# Patient Record
Sex: Female | Born: 1940
Health system: Southern US, Community
[De-identification: ages and names within clinical notes are randomized; demographics above are authoritative.]

## PROBLEM LIST (undated history)

## (undated) DIAGNOSIS — R5383 Other fatigue: Secondary | ICD-10-CM

## (undated) DIAGNOSIS — R5381 Other malaise: Secondary | ICD-10-CM

## (undated) DIAGNOSIS — H8110 Benign paroxysmal vertigo, unspecified ear: Secondary | ICD-10-CM

## (undated) DIAGNOSIS — F17200 Nicotine dependence, unspecified, uncomplicated: Secondary | ICD-10-CM

## (undated) DIAGNOSIS — H269 Unspecified cataract: Secondary | ICD-10-CM

## (undated) DIAGNOSIS — I739 Peripheral vascular disease, unspecified: Secondary | ICD-10-CM

## (undated) DIAGNOSIS — I10 Essential (primary) hypertension: Secondary | ICD-10-CM

## (undated) DIAGNOSIS — R634 Abnormal weight loss: Secondary | ICD-10-CM

## (undated) DIAGNOSIS — E559 Vitamin D deficiency, unspecified: Secondary | ICD-10-CM

## (undated) DIAGNOSIS — I272 Pulmonary hypertension, unspecified: Secondary | ICD-10-CM

## (undated) DIAGNOSIS — M79609 Pain in unspecified limb: Secondary | ICD-10-CM

## (undated) DIAGNOSIS — M19049 Primary osteoarthritis, unspecified hand: Secondary | ICD-10-CM

## (undated) DIAGNOSIS — M25559 Pain in unspecified hip: Secondary | ICD-10-CM

## (undated) DIAGNOSIS — J069 Acute upper respiratory infection, unspecified: Secondary | ICD-10-CM

## (undated) DIAGNOSIS — I701 Atherosclerosis of renal artery: Secondary | ICD-10-CM

## (undated) DIAGNOSIS — R945 Abnormal results of liver function studies: Secondary | ICD-10-CM

## (undated) DIAGNOSIS — M542 Cervicalgia: Secondary | ICD-10-CM

## (undated) DIAGNOSIS — IMO0001 Reserved for inherently not codable concepts without codable children: Secondary | ICD-10-CM

## (undated) DIAGNOSIS — M1990 Unspecified osteoarthritis, unspecified site: Secondary | ICD-10-CM

## (undated) DIAGNOSIS — I82890 Acute embolism and thrombosis of other specified veins: Secondary | ICD-10-CM

## (undated) DIAGNOSIS — R609 Edema, unspecified: Secondary | ICD-10-CM

## (undated) DIAGNOSIS — I219 Acute myocardial infarction, unspecified: Secondary | ICD-10-CM

## (undated) DIAGNOSIS — M545 Low back pain, unspecified: Secondary | ICD-10-CM

## (undated) DIAGNOSIS — M4800 Spinal stenosis, site unspecified: Secondary | ICD-10-CM

## (undated) DIAGNOSIS — R0600 Dyspnea, unspecified: Secondary | ICD-10-CM

## (undated) DIAGNOSIS — I071 Rheumatic tricuspid insufficiency: Secondary | ICD-10-CM

## (undated) DIAGNOSIS — R0989 Other specified symptoms and signs involving the circulatory and respiratory systems: Secondary | ICD-10-CM

## (undated) DIAGNOSIS — E785 Hyperlipidemia, unspecified: Secondary | ICD-10-CM

## (undated) DIAGNOSIS — I771 Stricture of artery: Secondary | ICD-10-CM

## (undated) DIAGNOSIS — B373 Candidiasis of vulva and vagina: Secondary | ICD-10-CM

## (undated) DIAGNOSIS — R1013 Epigastric pain: Secondary | ICD-10-CM

## (undated) DIAGNOSIS — I8000 Phlebitis and thrombophlebitis of superficial vessels of unspecified lower extremity: Secondary | ICD-10-CM

## (undated) DIAGNOSIS — Z7901 Long term (current) use of anticoagulants: Secondary | ICD-10-CM

## (undated) DIAGNOSIS — B3731 Acute candidiasis of vulva and vagina: Secondary | ICD-10-CM

## (undated) DIAGNOSIS — E508 Other manifestations of vitamin A deficiency: Secondary | ICD-10-CM

## (undated) DIAGNOSIS — E041 Nontoxic single thyroid nodule: Secondary | ICD-10-CM

## (undated) DIAGNOSIS — I639 Cerebral infarction, unspecified: Secondary | ICD-10-CM

## (undated) DIAGNOSIS — I2510 Atherosclerotic heart disease of native coronary artery without angina pectoris: Secondary | ICD-10-CM

## (undated) DIAGNOSIS — Z9889 Other specified postprocedural states: Secondary | ICD-10-CM

## (undated) DIAGNOSIS — Z01419 Encounter for gynecological examination (general) (routine) without abnormal findings: Secondary | ICD-10-CM

## (undated) DIAGNOSIS — N390 Urinary tract infection, site not specified: Secondary | ICD-10-CM

## (undated) DIAGNOSIS — I480 Paroxysmal atrial fibrillation: Secondary | ICD-10-CM

## (undated) DIAGNOSIS — E1159 Type 2 diabetes mellitus with other circulatory complications: Secondary | ICD-10-CM

## (undated) DIAGNOSIS — H219 Unspecified disorder of iris and ciliary body: Secondary | ICD-10-CM

## (undated) HISTORY — DX: Other malaise: R53.81

## (undated) HISTORY — DX: Atherosclerotic heart disease of native coronary artery without angina pectoris: I25.10

## (undated) HISTORY — PX: ANGIOPLASTY: SHX39

## (undated) HISTORY — DX: Acute upper respiratory infection, unspecified: J06.9

## (undated) HISTORY — DX: Unspecified osteoarthritis, unspecified site: M19.90

## (undated) HISTORY — DX: Pain in unspecified limb: M79.609

## (undated) HISTORY — DX: Nontoxic single thyroid nodule: E04.1

## (undated) HISTORY — DX: Unspecified disorder of iris and ciliary body: H21.9

## (undated) HISTORY — DX: Stricture of artery: I77.1

## (undated) HISTORY — DX: Vitamin D deficiency, unspecified: E55.9

## (undated) HISTORY — DX: Atherosclerosis of renal artery: I70.1

## (undated) HISTORY — DX: Edema, unspecified: R60.9

## (undated) HISTORY — DX: Cerebral infarction, unspecified: I63.9

## (undated) HISTORY — DX: Phlebitis and thrombophlebitis of superficial vessels of unspecified lower extremity: I80.00

## (undated) HISTORY — DX: Dyspnea, unspecified: R06.00

## (undated) HISTORY — DX: Other specified symptoms and signs involving the circulatory and respiratory systems: R09.89

## (undated) HISTORY — DX: Primary osteoarthritis, unspecified hand: M19.049

## (undated) HISTORY — DX: Benign paroxysmal vertigo, unspecified ear: H81.10

## (undated) HISTORY — DX: Nicotine dependence, unspecified, uncomplicated: F17.200

## (undated) HISTORY — DX: Epigastric pain: R10.13

## (undated) HISTORY — DX: Other manifestations of vitamin A deficiency: E50.8

## (undated) HISTORY — DX: Reserved for inherently not codable concepts without codable children: IMO0001

## (undated) HISTORY — DX: Urinary tract infection, site not specified: N39.0

## (undated) HISTORY — PX: KIDNEY STONE SURGERY: SHX686

## (undated) HISTORY — PX: BACK SURGERY: SHX140

## (undated) HISTORY — DX: Acute myocardial infarction, unspecified: I21.9

## (undated) HISTORY — DX: Paroxysmal atrial fibrillation: I48.0

## (undated) HISTORY — DX: Hyperlipidemia, unspecified: E78.5

## (undated) HISTORY — DX: Cervicalgia: M54.2

## (undated) HISTORY — DX: Other specified postprocedural states: Z98.890

## (undated) HISTORY — DX: Low back pain, unspecified: M54.50

## (undated) HISTORY — DX: Spinal stenosis, site unspecified: M48.00

## (undated) HISTORY — DX: Acute embolism and thrombosis of other specified veins: I82.890

## (undated) HISTORY — DX: Acute candidiasis of vulva and vagina: B37.31

## (undated) HISTORY — DX: Peripheral vascular disease, unspecified: I73.9

## (undated) HISTORY — DX: Abnormal results of liver function studies: R94.5

## (undated) HISTORY — DX: Pain in unspecified hip: M25.559

## (undated) HISTORY — DX: Low back pain: M54.5

## (undated) HISTORY — DX: Encounter for gynecological examination (general) (routine) without abnormal findings: Z01.419

## (undated) HISTORY — DX: Essential (primary) hypertension: I10

## (undated) HISTORY — DX: Candidiasis of vulva and vagina: B37.3

## (undated) HISTORY — DX: Unspecified cataract: H26.9

## (undated) HISTORY — DX: Other fatigue: R53.83

## (undated) HISTORY — DX: Type 2 diabetes mellitus with other circulatory complications: E11.59

## (undated) HISTORY — DX: Long term (current) use of anticoagulants: Z79.01

## (undated) HISTORY — DX: Abnormal weight loss: R63.4

## (undated) SURGERY — Surgical Case
Anesthesia: *Unknown

---
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
## 1973-11-05 HISTORY — PX: ABDOMINAL HYSTERECTOMY: SHX81

## 1998-10-31 ENCOUNTER — Encounter: Admission: RE | Admit: 1998-10-31 | Discharge: 1999-01-29 | Payer: Self-pay | Admitting: Internal Medicine

## 1999-02-20 ENCOUNTER — Encounter: Payer: Self-pay | Admitting: Urology

## 1999-02-20 ENCOUNTER — Ambulatory Visit (HOSPITAL_COMMUNITY): Admission: RE | Admit: 1999-02-20 | Discharge: 1999-02-20 | Payer: Self-pay | Admitting: Urology

## 1999-10-02 ENCOUNTER — Encounter: Admission: RE | Admit: 1999-10-02 | Discharge: 1999-10-02 | Payer: Self-pay | Admitting: Internal Medicine

## 1999-10-02 ENCOUNTER — Encounter: Payer: Self-pay | Admitting: Internal Medicine

## 1999-11-06 DIAGNOSIS — I779 Disorder of arteries and arterioles, unspecified: Secondary | ICD-10-CM

## 1999-11-06 DIAGNOSIS — I639 Cerebral infarction, unspecified: Secondary | ICD-10-CM

## 1999-11-06 HISTORY — PX: CAROTID ENDARTERECTOMY: SUR193

## 1999-11-06 HISTORY — DX: Disorder of arteries and arterioles, unspecified: I77.9

## 1999-11-06 HISTORY — DX: Cerebral infarction, unspecified: I63.9

## 2000-05-23 ENCOUNTER — Ambulatory Visit (HOSPITAL_COMMUNITY): Admission: RE | Admit: 2000-05-23 | Discharge: 2000-05-23 | Payer: Self-pay | Admitting: *Deleted

## 2000-05-23 ENCOUNTER — Encounter: Payer: Self-pay | Admitting: *Deleted

## 2000-06-12 ENCOUNTER — Encounter: Payer: Self-pay | Admitting: *Deleted

## 2000-06-13 ENCOUNTER — Inpatient Hospital Stay (HOSPITAL_COMMUNITY): Admission: RE | Admit: 2000-06-13 | Discharge: 2000-06-15 | Payer: Self-pay | Admitting: *Deleted

## 2000-06-13 ENCOUNTER — Encounter (INDEPENDENT_AMBULATORY_CARE_PROVIDER_SITE_OTHER): Payer: Self-pay | Admitting: *Deleted

## 2000-06-26 ENCOUNTER — Encounter: Payer: Self-pay | Admitting: Neurology

## 2000-06-26 ENCOUNTER — Encounter: Payer: Self-pay | Admitting: Emergency Medicine

## 2000-06-26 ENCOUNTER — Inpatient Hospital Stay (HOSPITAL_COMMUNITY): Admission: EM | Admit: 2000-06-26 | Discharge: 2000-06-30 | Payer: Self-pay | Admitting: Emergency Medicine

## 2000-06-27 ENCOUNTER — Encounter: Payer: Self-pay | Admitting: Vascular Surgery

## 2000-06-28 ENCOUNTER — Encounter: Payer: Self-pay | Admitting: Neurology

## 2000-08-13 ENCOUNTER — Encounter: Payer: Self-pay | Admitting: *Deleted

## 2000-08-14 ENCOUNTER — Encounter (INDEPENDENT_AMBULATORY_CARE_PROVIDER_SITE_OTHER): Payer: Self-pay | Admitting: *Deleted

## 2000-08-14 ENCOUNTER — Inpatient Hospital Stay (HOSPITAL_COMMUNITY): Admission: RE | Admit: 2000-08-14 | Discharge: 2000-08-20 | Payer: Self-pay | Admitting: *Deleted

## 2000-08-14 ENCOUNTER — Encounter: Payer: Self-pay | Admitting: *Deleted

## 2000-08-16 ENCOUNTER — Encounter: Payer: Self-pay | Admitting: *Deleted

## 2000-11-05 HISTORY — PX: PERCUTANEOUS CORONARY STENT INTERVENTION (PCI-S): SHX6016

## 2001-07-23 ENCOUNTER — Encounter: Payer: Self-pay | Admitting: Internal Medicine

## 2001-07-23 ENCOUNTER — Encounter: Admission: RE | Admit: 2001-07-23 | Discharge: 2001-07-23 | Payer: Self-pay | Admitting: Internal Medicine

## 2001-07-24 ENCOUNTER — Ambulatory Visit (HOSPITAL_COMMUNITY): Admission: RE | Admit: 2001-07-24 | Discharge: 2001-07-25 | Payer: Self-pay | Admitting: Cardiology

## 2004-07-14 ENCOUNTER — Encounter: Admission: RE | Admit: 2004-07-14 | Discharge: 2004-07-14 | Payer: Self-pay

## 2005-04-15 ENCOUNTER — Ambulatory Visit (HOSPITAL_COMMUNITY): Admission: RE | Admit: 2005-04-15 | Discharge: 2005-04-15 | Payer: Self-pay

## 2005-05-15 ENCOUNTER — Encounter: Admission: RE | Admit: 2005-05-15 | Discharge: 2005-05-15 | Payer: Self-pay | Admitting: Orthopaedic Surgery

## 2006-03-13 DIAGNOSIS — R0600 Dyspnea, unspecified: Secondary | ICD-10-CM

## 2006-03-13 HISTORY — DX: Dyspnea, unspecified: R06.00

## 2006-08-28 ENCOUNTER — Encounter: Admission: RE | Admit: 2006-08-28 | Discharge: 2006-08-28 | Payer: Self-pay

## 2007-12-30 ENCOUNTER — Encounter: Admission: RE | Admit: 2007-12-30 | Discharge: 2007-12-30 | Payer: Self-pay | Admitting: Otolaryngology

## 2008-01-07 ENCOUNTER — Encounter: Admission: RE | Admit: 2008-01-07 | Discharge: 2008-01-07 | Payer: Self-pay | Admitting: Otolaryngology

## 2008-02-05 ENCOUNTER — Ambulatory Visit: Payer: Self-pay | Admitting: *Deleted

## 2008-02-09 ENCOUNTER — Encounter: Admission: RE | Admit: 2008-02-09 | Discharge: 2008-02-09 | Payer: Self-pay | Admitting: Internal Medicine

## 2008-07-19 ENCOUNTER — Encounter: Admission: RE | Admit: 2008-07-19 | Discharge: 2008-07-19 | Payer: Self-pay | Admitting: Otolaryngology

## 2008-08-26 ENCOUNTER — Ambulatory Visit: Payer: Self-pay | Admitting: *Deleted

## 2009-01-13 ENCOUNTER — Ambulatory Visit: Payer: Self-pay | Admitting: *Deleted

## 2009-02-09 ENCOUNTER — Encounter: Admission: RE | Admit: 2009-02-09 | Discharge: 2009-02-09 | Payer: Self-pay | Admitting: Internal Medicine

## 2009-07-14 ENCOUNTER — Ambulatory Visit: Payer: Self-pay | Admitting: Surgery

## 2009-11-08 ENCOUNTER — Encounter: Admission: RE | Admit: 2009-11-08 | Discharge: 2009-11-08 | Payer: Self-pay | Admitting: Internal Medicine

## 2010-01-27 ENCOUNTER — Ambulatory Visit: Payer: Self-pay | Admitting: Vascular Surgery

## 2010-08-08 ENCOUNTER — Ambulatory Visit: Payer: Self-pay | Admitting: Vascular Surgery

## 2010-11-26 ENCOUNTER — Encounter: Payer: Self-pay | Admitting: Internal Medicine

## 2010-12-27 ENCOUNTER — Other Ambulatory Visit: Payer: Self-pay | Admitting: Internal Medicine

## 2010-12-27 DIAGNOSIS — Z1231 Encounter for screening mammogram for malignant neoplasm of breast: Secondary | ICD-10-CM

## 2011-01-04 ENCOUNTER — Ambulatory Visit
Admission: RE | Admit: 2011-01-04 | Discharge: 2011-01-04 | Disposition: A | Discharge status: Home / Self Care | Payer: Medicare Other | Source: Ambulatory Visit | Attending: Internal Medicine | Admitting: Internal Medicine

## 2011-01-04 DIAGNOSIS — Z1231 Encounter for screening mammogram for malignant neoplasm of breast: Secondary | ICD-10-CM

## 2011-01-22 ENCOUNTER — Inpatient Hospital Stay (HOSPITAL_COMMUNITY)
Admission: EM | Admit: 2011-01-22 | Discharge: 2011-01-25 | DRG: 310 | Disposition: A | Discharge status: Home / Self Care | Payer: Medicare Other | Attending: Cardiology | Admitting: Cardiology

## 2011-01-22 ENCOUNTER — Emergency Department (HOSPITAL_COMMUNITY): Payer: Medicare Other

## 2011-01-22 DIAGNOSIS — E785 Hyperlipidemia, unspecified: Secondary | ICD-10-CM | POA: Diagnosis present

## 2011-01-22 DIAGNOSIS — I6529 Occlusion and stenosis of unspecified carotid artery: Secondary | ICD-10-CM | POA: Diagnosis present

## 2011-01-22 DIAGNOSIS — Z7982 Long term (current) use of aspirin: Secondary | ICD-10-CM

## 2011-01-22 DIAGNOSIS — Z87891 Personal history of nicotine dependence: Secondary | ICD-10-CM

## 2011-01-22 DIAGNOSIS — E119 Type 2 diabetes mellitus without complications: Secondary | ICD-10-CM | POA: Diagnosis present

## 2011-01-22 DIAGNOSIS — H6690 Otitis media, unspecified, unspecified ear: Secondary | ICD-10-CM | POA: Diagnosis present

## 2011-01-22 DIAGNOSIS — I658 Occlusion and stenosis of other precerebral arteries: Secondary | ICD-10-CM | POA: Diagnosis present

## 2011-01-22 DIAGNOSIS — I10 Essential (primary) hypertension: Secondary | ICD-10-CM | POA: Diagnosis present

## 2011-01-22 DIAGNOSIS — Z79899 Other long term (current) drug therapy: Secondary | ICD-10-CM

## 2011-01-22 DIAGNOSIS — I2510 Atherosclerotic heart disease of native coronary artery without angina pectoris: Secondary | ICD-10-CM | POA: Diagnosis present

## 2011-01-22 DIAGNOSIS — M129 Arthropathy, unspecified: Secondary | ICD-10-CM | POA: Diagnosis present

## 2011-01-22 DIAGNOSIS — F0390 Unspecified dementia without behavioral disturbance: Secondary | ICD-10-CM | POA: Diagnosis present

## 2011-01-22 DIAGNOSIS — I4891 Unspecified atrial fibrillation: Principal | ICD-10-CM | POA: Diagnosis present

## 2011-01-22 LAB — BASIC METABOLIC PANEL
BUN: 20 mg/dL (ref 6–23)
CO2: 27 mEq/L (ref 19–32)
Calcium: 9.2 mg/dL (ref 8.4–10.5)
Chloride: 93 mEq/L — ABNORMAL LOW (ref 96–112)
GFR calc Af Amer: >60 mL/min (ref >60)
Potassium: 3.8 mEq/L (ref 3.5–5.1)
Sodium: 132 mEq/L — ABNORMAL LOW (ref 135–145)

## 2011-01-22 LAB — URINALYSIS, ROUTINE W REFLEX MICROSCOPIC
Bilirubin Urine: NEGATIVE
Leukocytes, UA: NEGATIVE
Specific Gravity, Urine: 1.015 (ref 1.005–1.030)
Urobilinogen, UA: 1 mg/dL (ref 0.0–1.0)
pH: 6.5 (ref 5.0–8.0)

## 2011-01-22 LAB — COMPREHENSIVE METABOLIC PANEL
ALT: 33 U/L (ref 0–35)
Albumin: 2.9 g/dL — ABNORMAL LOW (ref 3.5–5.2)
Alkaline Phosphatase: 65 U/L (ref 39–117)
Chloride: 96 mEq/L (ref 96–112)
GFR calc non Af Amer: 59 mL/min — ABNORMAL LOW (ref >60)
Glucose, Bld: 216 mg/dL — ABNORMAL HIGH (ref 70–99)
Sodium: 134 mEq/L — ABNORMAL LOW (ref 135–145)
Total Protein: 7.2 g/dL (ref 6.0–8.3)

## 2011-01-22 LAB — PROTIME-INR: Prothrombin Time: 15 seconds (ref 11.6–15.2)

## 2011-01-22 LAB — CBC
HCT: 36.6 % (ref 36.0–46.0)
Hemoglobin: 12.2 g/dL (ref 12.0–15.0)
MCH: 27.3 pg (ref 26.0–34.0)
MCV: 81.9 fL (ref 78.0–100.0)
WBC: 11.7 10*3/uL — ABNORMAL HIGH (ref 4.0–10.5)

## 2011-01-22 LAB — URINE MICROSCOPIC-ADD ON

## 2011-01-22 LAB — DIFFERENTIAL
Eosinophils Relative: 0 % (ref 0–5)
Monocytes Absolute: 1.1 10*3/uL — ABNORMAL HIGH (ref 0.1–1.0)

## 2011-01-23 HISTORY — PX: TEE WITH CARDIOVERSION: SHX5442

## 2011-01-23 LAB — CBC
HCT: 31.6 % — ABNORMAL LOW (ref 36.0–46.0)
Hemoglobin: 10.4 g/dL — ABNORMAL LOW (ref 12.0–15.0)
MCH: 26.7 pg (ref 26.0–34.0)
MCHC: 32.9 g/dL (ref 30.0–36.0)
MCV: 81.2 fL (ref 78.0–100.0)
Platelets: 294 K/uL (ref 150–400)
RBC: 3.89 MIL/uL (ref 3.87–5.11)
RDW: 15.3 % (ref 11.5–15.5)
WBC: 8.3 K/uL (ref 4.0–10.5)

## 2011-01-23 LAB — URINE MICROSCOPIC-ADD ON

## 2011-01-23 LAB — URINALYSIS, ROUTINE W REFLEX MICROSCOPIC
Bilirubin Urine: NEGATIVE
Glucose, UA: NEGATIVE mg/dL
Ketones, ur: NEGATIVE mg/dL
Leukocytes, UA: NEGATIVE
Nitrite: NEGATIVE
Protein, ur: 100 mg/dL — AB
Specific Gravity, Urine: 1.014 (ref 1.005–1.030)
Urobilinogen, UA: 1 mg/dL (ref 0.0–1.0)
pH: 6.5 (ref 5.0–8.0)

## 2011-01-23 LAB — HEPARIN LEVEL (UNFRACTIONATED)

## 2011-01-23 LAB — GLUCOSE, CAPILLARY
Glucose-Capillary: 142 mg/dL — ABNORMAL HIGH (ref 70–99)
Glucose-Capillary: 230 mg/dL — ABNORMAL HIGH (ref 70–99)
Glucose-Capillary: 109 mg/dL — ABNORMAL HIGH (ref 70–99)

## 2011-01-24 ENCOUNTER — Inpatient Hospital Stay (HOSPITAL_COMMUNITY): Payer: Medicare Other

## 2011-01-24 LAB — GLUCOSE, CAPILLARY
Glucose-Capillary: 83 mg/dL (ref 70–99)
Glucose-Capillary: 129 mg/dL — ABNORMAL HIGH (ref 70–99)
Glucose-Capillary: 106 mg/dL — ABNORMAL HIGH (ref 70–99)
Glucose-Capillary: 115 mg/dL — ABNORMAL HIGH (ref 70–99)

## 2011-01-24 LAB — CARDIAC PANEL(CRET KIN+CKTOT+MB+TROPI)
Relative Index: INVALID (ref 0.0–2.5)
Total CK: 56 U/L (ref 7–177)
Troponin I: 0.09 ng/mL — ABNORMAL HIGH (ref 0.00–0.06)

## 2011-01-24 LAB — BASIC METABOLIC PANEL
CO2: 30 mEq/L (ref 19–32)
Calcium: 9.1 mg/dL (ref 8.4–10.5)
Creatinine, Ser: 1.04 mg/dL (ref 0.4–1.2)
GFR calc Af Amer: >60 mL/min (ref >60)
GFR calc non Af Amer: 52 mL/min — ABNORMAL LOW (ref >60)
Sodium: 136 mEq/L (ref 135–145)

## 2011-01-24 LAB — CBC
Hemoglobin: 10.8 g/dL — ABNORMAL LOW (ref 12.0–15.0)
MCH: 26.5 pg (ref 26.0–34.0)
MCV: 81.3 fL (ref 78.0–100.0)
RBC: 4.07 MIL/uL (ref 3.87–5.11)
WBC: 8.3 10*3/uL (ref 4.0–10.5)

## 2011-01-24 LAB — URINE CULTURE
Colony Count: 3000
Culture  Setup Time: 201203201230

## 2011-01-24 LAB — PROTIME-INR: INR: 1.07 (ref 0.00–1.49)

## 2011-01-24 LAB — HEPARIN LEVEL (UNFRACTIONATED): Heparin Unfractionated: 0.69 IU/mL (ref 0.30–0.70)

## 2011-01-25 LAB — GLUCOSE, CAPILLARY: Glucose-Capillary: 67 mg/dL — ABNORMAL LOW (ref 70–99)

## 2011-01-25 LAB — CBC
Hemoglobin: 11.2 g/dL — ABNORMAL LOW (ref 12.0–15.0)
MCH: 26.7 pg (ref 26.0–34.0)
Platelets: 352 10*3/uL (ref 150–400)
RBC: 4.19 MIL/uL (ref 3.87–5.11)
WBC: 8.3 10*3/uL (ref 4.0–10.5)

## 2011-01-25 LAB — BASIC METABOLIC PANEL
CO2: 29 mEq/L (ref 19–32)
Chloride: 100 mEq/L (ref 96–112)
GFR calc Af Amer: >60 mL/min (ref >60)
Sodium: 136 mEq/L (ref 135–145)

## 2011-01-25 LAB — HEPARIN LEVEL (UNFRACTIONATED): Heparin Unfractionated: 0.55 IU/mL (ref 0.30–0.70)

## 2011-01-25 LAB — PROTIME-INR: Prothrombin Time: 20.8 seconds — ABNORMAL HIGH (ref 11.6–15.2)

## 2011-02-01 NOTE — Discharge Summary (Signed)
Kendra Todd, BOEHRINGER                   ACCOUNT NO.:  [id number]  MEDICAL RECORD NO.:  [id number]           PATIENT TYPE:  I  LOCATION:  2015                         FACILITY:  [facility]  PHYSICIAN:  Nicki Guadalajara, M.D.     DATE OF BIRTH:  10/25/1941  DATE OF ADMISSION:  01/22/2011 DATE OF DISCHARGE:  01/25/2011                              DISCHARGE SUMMARY   DISCHARGE DIAGNOSES: 1. Atrial fibrillation with rapid ventricular response, currently in     normal sinus rhythm. 2. History of coronary artery disease, multivessel, multiple prior     percutaneous coronary intervention and stenting. 3. Diabetes mellitus type 2. 4. Left otitis media, currently on amoxicillin 500 mg p.o. b.i.d. 5. History of dementia.  HOSPITAL COURSE:  Kendra Todd is a 70 year old female with history of coronary artery disease.  She had a remote circumflex angioplasty in 1993 and upper GI stent in September 2002.  She presented to the emergency room with shortness of breath.  She had been seen just a day before at Urgent Care for an ear infection.  Upon presentation in the emergency room, she was found to be in atrial fibrillation with a rapid ventricular response at a rate of 140 beats per minute and started on IV diltiazem.  She was admitted for rate control, check cardiac enzymes. She was started on heparinand Coumadin.  A 2-D echocardiogram was ordered as well, which showed ejection fraction of 50%-55%, normal wall motion, grade 2 diastolic dysfunction.  Peak PA pressure 45 mmHg.  This was consistent with moderate pulmonary hypertension.  By March 20, the patient had converted to sinus rhythm, metoprolol was increased to 50 mg twice daily.  IV Cardizem has been discontinued on March 21.  The patient had no complaints of dyspnea. Currently, the patient feels much better.  INR is 1.77.  She is continuing in sinus rhythm.  We will continue her on metoprolol and Coumadin.  She will be given one dose of Lovenox at  1500 hours today prior to discharge as well as her next dose of Coumadin.  She will follow up with INR check tomorrow at [facility]. She has been seen by Dr. Tresa Endo, who feels she is stable for discharge.  DISCHARGE LABS:  WBC is 8.3, hemoglobin 11.2, hematocrit 33.8, platelets 352.  Pro-time 20.8.  INR 1.77.  Sodium 136, potassium 4.3, chloride 100, carbon dioxide 29, glucose 85, BUN 13, creatinine 1.03, calcium 9.6.  She did have a peak troponin of 0.09.  BNP was 184.  Urinalysis revealed trace blood, 100 protein.  STUDIES/PROCEDURES: 1. Chest x-ray, January 24, 2011, no acute findings.  There is minimal     subsegmental atelectasis in the medial aspect of both lower lobes.     Lungs are otherwise clear. 2. A 2-D echocardiogram on March 20, study conclusions ejection     fraction 50%-55%, normal wall motion of the left ventricle, no     regional wall motion abnormalities.  This was consistent with grade     2 diastolic dysfunction.  Peak PA pressure was 45 mmHg consistent  with moderate pulmonary hypertension.  Aortic valve showed trivial     regurgitation and mild focal nodular sclerosis of the noncoronary     cusp.  Mitral valve showed calcified annulus, mildly thickened     leaflets, moderate regurgitation.  Right ventricle systolic     pressure was increased and right atrium was mildly dilated.  His     tricuspid valve showed moderate-to-severe regurgitation.  DISCHARGE MEDICATIONS: 1. Coumadin 3 mg 1 tablet by mouth daily. 2. Metoprolol 50 mg 1 tablet by mouth twice daily. 3. Nitroglycerin sublingual 0.4 mg 1 tablet under the tongue every 5     minutes, up to 3 doses total for chest pain. 4. Amoxicillin 500 mg 1 tablet by mouth twice daily, this was     initiated by Urgent Care. 5. Aspirin enteric-coated 81 mg 1 tablet by mouth daily. 6. Crestor 40 mg 1 tablet by mouth daily. 7. Diovan HCT 320/25 mg 1 tablet by mouth daily. 8. Glyburide 5 mg 1 tablet by  mouth daily. 9. Metformin 1000 mg 1 tablet by mouth twice daily. 10.Voltaren 50 mg 1 tablet by mouth twice daily.  DISPOSITION:  [name] will be discharged home in stable condition. After 1500 hours today, given 1 dose of Lovenox prior to discharge as well as her next dose of Coumadin.  She will follow up with [facility] for an INR check tomorrow, March 23.  It was recommended that she eats a low-sodium heart-healthy diet as well as lower in carbohydrate due to diabetes.    ______________________________ Wilburt Finlay, PA   ______________________________ Nicki Guadalajara, M.D.    BH/MEDQ  D:  01/25/2011  T:  01/26/2011  Job:  [id number]  cc:   Thurmon Fair, MD  Electronically Signed by Wilburt Finlay PA on 02/01/2011 03:05:17 PM Electronically Signed by Nicki Guadalajara M.D. on 02/01/2011 05:05:08 PM

## 2011-02-06 ENCOUNTER — Other Ambulatory Visit: Payer: Self-pay

## 2011-02-16 ENCOUNTER — Other Ambulatory Visit (INDEPENDENT_AMBULATORY_CARE_PROVIDER_SITE_OTHER): Payer: Medicare Other

## 2011-02-16 DIAGNOSIS — I6529 Occlusion and stenosis of unspecified carotid artery: Secondary | ICD-10-CM

## 2011-02-16 DIAGNOSIS — Z48812 Encounter for surgical aftercare following surgery on the circulatory system: Secondary | ICD-10-CM

## 2011-02-21 ENCOUNTER — Encounter (INDEPENDENT_AMBULATORY_CARE_PROVIDER_SITE_OTHER): Payer: Medicare Other | Admitting: Vascular Surgery

## 2011-02-21 DIAGNOSIS — I480 Paroxysmal atrial fibrillation: Secondary | ICD-10-CM

## 2011-02-21 DIAGNOSIS — I6529 Occlusion and stenosis of unspecified carotid artery: Secondary | ICD-10-CM

## 2011-02-21 HISTORY — DX: Paroxysmal atrial fibrillation: I48.0

## 2011-02-21 NOTE — H&P (Signed)
Kendra Todd, Kendra Todd                   ACCOUNT NO.:  [id number]  MEDICAL RECORD NO.:  [id number]           PATIENT TYPE:  E  LOCATION:  [facility]                         FACILITY:  [facility]  PHYSICIAN:  Thereasa Solo. Melesio Madara, M.D. DATE OF BIRTH:  Oct 22, 1941  DATE OF ADMISSION:  01/22/2011 DATE OF DISCHARGE:                             HISTORY & PHYSICAL   CHIEF COMPLAINT:  Shortness of breath.  HISTORY OF PRESENT ILLNESS:  Kendra Todd is a pleasant 70 year old female with a history of coronary disease.  She had a remote circumflex angioplasty in 1993 and an RCA stent in September 2002.  She presents to the emergency room now with shortness of breath.  She was actually seen in urgent care yesterday for an ear infection.  We called them and they did not note any irregular heart rhythm or abnormal vital signs.  In the emergency room today, she is in rapid atrial fibrillation at a rate of about 140.  She had some response to IV diltiazem, but she is still fast.  She is admitted now for further evaluation.  PAST MEDICAL HISTORY:  Remarkable for coronary disease as described above.  She has vascular disease.  She had bilateral carotid endarterectomies in 2001 with moderate restenosis documented in April of 2009 by Dr. Madilyn Fireman, she was treated medically.  She gets annual Doppler at [facility].  Her last set was October 2011 and she had a 60-79% right internal carotid artery narrowing and a 1-39% left internal carotid artery narrowing.  She has type 2 diabetes.  She has arthritis.  She has dyslipidemia.  HOME MEDICATIONS: 1. Amoxicillin 500 mg b.i.d. 2. Aspirin 81 mg a day. 3. Crestor 40 mg a day. 4. Voltaren 50 mg b.i.d. 5. Diovan HCTZ 320/25 a day. 6. Glyburide 5 mg a day. 7. Metformin 1 gram b.i.d. 8. Metoprolol 25 mg b.i.d.  ALLERGIES:  She has no known drug allergies.  SOCIAL HISTORY:  She is married, her husband is a long-term nursing home patient with dementia at the [place].  She has one son.  She does  not smoke anymore, although she did smoke in 2009 about half pack a day.  FAMILY HISTORY:  Remarkable for coronary artery disease.  REVIEW OF SYSTEMS:  Essentially unremarkable except for noted above.  PHYSICAL EXAM:  VITAL SIGNS:  Blood pressure is 120/76, pulse 153, respiration 16. GENERAL:  She is a well-developed, well-nourished, African-American female, in no acute distress. HEENT:  Normocephalic.  She has pain in her left ear with an inflamed left tympanic membrane. NECK:  Reveals some supraclavicular nodes present, she has bilateral carotid bruits. CHEST:  Reveals a clear lung fields. CARDIAC:  Reveals irregularly irregular rhythm without obvious murmur or rub and a rapid rate. ABDOMEN:  Nontender, nondistended. EXTREMITIES: Without obvious edema. NEURO:  Grossly intact.  She is awake, alert, oriented, and cooperative. Moves all extremities without obvious deficit. SKIN:  Cool and dry.  LABORATORY DATA:  Sodium 132, potassium 3.8, BUN 20, creatinine 1.05, glucose 304.  White count 11.7, hemoglobin 12.2, hematocrit 36.6, platelets 298.  Troponin is negative x1.  CK is  negative x1.  Chest x- ray shows no acute disease.  EKG shows atrial fibrillation with rapid ventricular response, which apparently is new for her.  IMPRESSION: 1. Rapid atrial fibrillation, new onset, we believe this was probably     in the last 24 to 48 hours. 2. Dyspnea secondary to rapid atrial fibrillation. 3. Coronary disease history with circumflex PTCA in 1993 and RCA stent     in September 2002. 4. Known vascular disease with bilateral carotid endarterectomies in     2001 with moderate restenosis, especially on the right by Doppler     October 2011, plan is for medical therapy. 5. Hypertension with a history of patent renal arteries. 6. Type 2 diabetes. 7. Ex-smoker. 8. Treated dyslipidemia.  PLAN:  The patient will be admitted for rate control.  We will go ahead and check a set of enzymes.   If these are negative, we can probably start her on Coumadin.  She will be started on heparin.  We cut her Diovan HCTZ back as we have added diltiazem.     Kendra Todd, P.A.   ______________________________ Thereasa Solo. Kaliana Albino, M.D.    Lenard Lance  D:  01/22/2011  T:  01/22/2011  Job:  [id number]  cc:   Eustaquio Boyden, MD [name]. Chilton Si, M.D.  Electronically Signed by Corine Shelter P.A. on 02/05/2011 11:38:53 AM Electronically Signed by Julieanne Manson M.D. on 02/21/2011 08:24:30 AM

## 2011-02-22 ENCOUNTER — Other Ambulatory Visit: Payer: Self-pay | Admitting: Vascular Surgery

## 2011-02-22 DIAGNOSIS — I6529 Occlusion and stenosis of unspecified carotid artery: Secondary | ICD-10-CM

## 2011-02-22 NOTE — Assessment & Plan Note (Signed)
OFFICE VISIT  Kendra Todd, Kendra Todd DOB:  Nov 03, 1941                                       02/21/2011 EAVWU#:[id number]  I saw Ms. Kendra Todd in the office today concerning a greater than 80% recurrent right carotid stenosis.  This is a pleasant 70 year old woman well-known to our practice.  In August 2001 she had a left carotid endarterectomy by Dr. Madilyn Fireman but returned several weeks later with an aphasia and right-sided weakness and was found to have an intimal flap at her distal endarterectomy site.  Dr. Arbie Cookey performed endarterectomy in the distal intimal flap and re-patch to the artery.  She has done well from that standpoint.  On August 14, 2000, she had a right carotid endarterectomy with Dacron patch angioplasty by Dr. Madilyn Fireman.  In reading his op note, it is noted that the bifurcation on the right was high.  It was also noted the plaque in the internal carotid artery extended up high into the internal carotid artery.  The endpoint was difficult to obtain distally but Dr. Madilyn Fireman was concerned about the Doppler signal and an intraoperative arteriogram was obtained which revealed a possible weblike flap at the distal endarterectomy site.  The arteriotomy and internal carotid artery exposed distally very close to the base of the skull but he was able to identify the flap and this was tacked down and then the closure completed.  This patient had come in for routine carotid duplex scan on February 16, 2011 and at that time it was noted that the 60-79% right carotid stenosis had progressed to greater than 80%.  There was no significant stenosis on the left.  Dr. Arbie Cookey was out-of-town and the patient was put on for me to evaluate.  The patient states that since her stroke after her left carotid endarterectomy 2001, she had no history of stroke, TIAs, expressive or receptive aphasia or amaurosis fugax.  She is on aspirin and also just recently started on Coumadin because she  went into atrial fibrillation after apparently having a reaction to Vicodin according to the patient.  PAST MEDICAL HISTORY:  Significant for hypertension and hypercholesterolemia which are controlled on current medications.  She denies having diabetes, however, on reviewing her discharge summary from March it appears that she does have type 2 diabetes and it also mentions history of dementia although she seemed quite oriented that today.  In addition, it is noted in her discharge summary that she has coronary disease and has had multiple prior percutaneous coronary interventions and stenting.  Her cardiologist at [facility] I believe is Dr. Royann Shivers.  SOCIAL HISTORY:  She is married.  She has 3 children.  She continues to smoke and has been smoking for 57 years.  She has cut to less than half a pack per day.  FAMILY HISTORY:  She states she has a strong family history of coronary artery disease and has lost eight siblings to coronary artery disease.  REVIEW OF SYSTEMS:  GENERAL:  She has had no recent weight loss, weight gain or problems with her appetite. CARDIOVASCULAR:  She has had no recent chest pain, chest pressure, palpitations or significant dyspnea on exertion.  She did recently have an episode of atrial fibrillation.  However, in reviewing her discharge summary it looks like she went back into sinus rhythm prior to discharge.  She had no history of DVT  or phlebitis. MUSCULOSKELETAL:  She does have a history of arthritis. GI, neurologic, pulmonary, hematologic, GU, ENT, psychiatric, integumentary review of systems is unremarkable and is documented on the medical history form in her chart.  PHYSICAL EXAMINATION:  This is a pleasant 70 year old woman who appears her stated age. Blood pressure is 176/72, heart rate is 69, respiratory rate is 20. HEENT:  Unremarkable. LUNGS:  Clear bilaterally to auscultation without rales, rhonchi or wheezing. CARDIOVASCULAR:  She has a  right carotid bruit.  I cannot appreciate a left carotid bruit.  She has a regular rate and rhythm.  She has a palpable right femoral pulse with a slightly diminished left femoral pulse.  I cannot palpate pedal pulses, however, both feet appear adequately perfused. ABDOMEN:  Soft and nontender with normal pitched bowel sounds. MUSCULOSKELETAL:  No major deformities or cyanosis. NEUROLOGIC:  She has no focal weakness or paresthesias. SKIN:  There are no ulcers or rashes.  I have independently interpreted her carotid duplex scan which shows a greater than 80% recurrent right carotid stenosis which has progressed significantly compared to the study in October 2011.  She has no significant stenosis on the left carotid.  Both vertebral arteries are patent with normally directed flow.  Given the recurrent right carotid stenosis of greater than 80%, I think consideration should be considered to carotid endarterectomy versus carotid stenting.  In reviewing the op note, it appears that the stenosis was very high and the bifurcation was high and I think ideally if this could be addressed with a carotid stent, this would be best.  I have ordered a CT angio of the arch and carotids with an appointment to see Dr. Darrick Penna after that to evaluate the patient for carotid stenting if possible.  If she is not a candidate for carotid stenting, she may require arteriography to determine if this stenosis is surgically accessible and amenable to carotid endarterectomy.  For now she will remain on her Coumadin and her aspirin.    Di Kindle. Kendra Todd, M.D. Electronically Signed  CSD/MEDQ  D:  02/21/2011  T:  02/22/2011  Job:  [id number]  cc:   Larina Earthly, M.D. Janetta Hora. Darrick Penna, MD

## 2011-02-27 NOTE — Procedures (Unsigned)
CAROTID DUPLEX EXAM  INDICATION:  Followup carotid artery disease.  HISTORY: Diabetes:  Yes. Cardiac:  PTCA. Hypertension:  Yes. Smoking:  Current. Previous Surgery:  Bilateral carotid endarterectomy 2001 by Dr. Madilyn Fireman, left redo in 2001 by Dr. Arbie Cookey. CV History:  The patient is currently asymptomatic. Amaurosis Fugax No, Paresthesias No, Hemiparesis No                                      RIGHT             LEFT Brachial systolic pressure:         162               174 Brachial Doppler waveforms:         WNL               WNL Vertebral direction of flow:        Antegrade         Antegrade DUPLEX VELOCITIES (cm/sec) CCA peak systolic                   56                79 ECA peak systolic                   137               100 ICA peak systolic                   494               105 ICA end diastolic                   120               37 PLAQUE MORPHOLOGY:                  Heterogeneous     Heterogeneous PLAQUE AMOUNT:                      Moderate to severe                  Mild to moderate PLAQUE LOCATION:                    ICA, ECA          ICA, bifurcation  IMPRESSION:  80% to 99% stenosis within the right internal carotid artery, an increase from the previous study of 60% to 79% confirmed by second tech.  Left side no hemodynamically significant stenosis. Bilateral common carotid artery intimal thickening within the common carotid arteries.   ___________________________________________ Larina Earthly, M.D.  OD/MEDQ  D:  02/16/2011  T:  02/16/2011  Job:  [id number]

## 2011-03-07 ENCOUNTER — Other Ambulatory Visit: Payer: Medicare Other

## 2011-03-07 ENCOUNTER — Ambulatory Visit: Payer: Medicare Other | Admitting: Vascular Surgery

## 2011-03-08 ENCOUNTER — Other Ambulatory Visit: Payer: Medicare Other

## 2011-03-08 ENCOUNTER — Ambulatory Visit (INDEPENDENT_AMBULATORY_CARE_PROVIDER_SITE_OTHER): Payer: Medicare Other | Admitting: Vascular Surgery

## 2011-03-08 ENCOUNTER — Ambulatory Visit
Admission: RE | Admit: 2011-03-08 | Discharge: 2011-03-08 | Disposition: A | Discharge status: Home / Self Care | Payer: Medicare Other | Source: Ambulatory Visit | Attending: Vascular Surgery | Admitting: Vascular Surgery

## 2011-03-08 DIAGNOSIS — I6529 Occlusion and stenosis of unspecified carotid artery: Secondary | ICD-10-CM

## 2011-03-08 MED ORDER — IOHEXOL 300 MG/ML  SOLN
100.0000 mL | Freq: Once | INTRAMUSCULAR | Status: AC | PRN
  Administered 2011-03-08: 100 mL via INTRAVENOUS

## 2011-03-09 NOTE — Assessment & Plan Note (Signed)
OFFICE VISIT  Kendra Todd, Kendra Todd DOB:  13-Nov-1940                                       03/08/2011 LKGMW#:[id number]  CHIEF COMPLAINT:  Recurrent carotid stenosis.  HISTORY OF PRESENT ILLNESS:  The patient is a 70 year old female referred by Dr. Edilia Bo for evaluation for possible carotid stenting. The patient previously had a left carotid endarterectomy in 2001 by Dr. Madilyn Fireman.  She had a distal intimal flap which caused her to have a mild stroke.  This was 14 days postoperative.  She also had a right carotid endarterectomy in October of 2001 by Dr. Madilyn Fireman and apparently at that time she was noted to have a high carotid bifurcation with concerns about a distal dissection.  She recovered from her initial stroke in 2001 and has had no symptoms of TIA, amaurosis or stroke since then. She has been followed with serial duplex exam.  She was noted recently to have progression of stenosis on the right side to greater than 80%. She is on aspirin but also is taking Coumadin.  She recently was started on the Coumadin for atrial fibrillation.  CHRONIC MEDICAL PROBLEMS:  Include hypertension, elevated cholesterol, possible diabetes, coronary artery disease with prior coronary stenting.  MEDICATIONS: 1. Diovan 160/25 once daily. 2. Metoprolol 50 mg twice a day. 3. Glyburide 5 mg once a day. 4. Metformin 1000 mg twice a day. 5. Crestor 20 mg q.h.s. 6. Coumadin 3 mg as directed. 7. Nitroglycerin p.r.n. 8. Amoxicillin 500 mg twice a day. 9. Aspirin 81 mg once a day.  ALLERGIES:  She has allergies listed to Vicodin and Vioxx.  Vicodin was thought to have led to her going into atrial fibrillation.  PHYSICAL EXAM:  Vital signs:  Today the patient's heart rate is 84, slightly irregular.  Blood pressure is 126/70 in the left arm. Neurological:  Shows symmetric upper extremity and lower extremity strength which is 5/5.  Neck:  Has no carotid bruits.  She has 2+ carotid pulses.   She has well-healed neck scars bilaterally.  I reviewed her CT angiogram of the chest today which shows a type 2 aortic arch possibly leaning towards a type 1 aortic arch.  There is a high-grade stenosis of the distal right internal carotid artery with possible healed area of dissection.  The left internal carotid artery is patent.  I reviewed her carotid duplex exam from 02/16/2011 which shows a peak systolic velocity on the right side of 494 cm/sec with an end diastolic velocity of 120 cm/sec.  The left side had no hemodynamically significant stenosis.  I spoke with the patient today about the possibility of carotid stenting due to higher risk of cranial nerve injuries with the distal extent of the stenosis as well as the possibility of not being able to reach the stenosis through a cervical approach.  I discussed with her the procedure details, risks, benefits, possible complications including but not limited to bleeding, infection, stroke risk of 5%.  She understands and agrees to proceed.  We will need to schedule this around stopping her Coumadin for several days.  I will also coordinate my schedule with Dr. Nanetta Batty who will be assisting with the procedure.  All this was discussed with the patient today.  We will schedule her most likely for SAPPHIRE protocol since she is currently asymptomatic but is anatomically high risk.  Kendra Hora. Kermitt Harjo, MD Electronically Signed  CEF/MEDQ  D:  03/08/2011  T:  03/09/2011  Job:  [id number]  cc:   Thurmon Fair, MD Nanetta Batty, M.D. [name] Chilton Si, M.D. Di Kindle. Edilia Bo, M.D.

## 2011-03-14 ENCOUNTER — Other Ambulatory Visit: Payer: Self-pay | Admitting: Internal Medicine

## 2011-03-14 DIAGNOSIS — R634 Abnormal weight loss: Secondary | ICD-10-CM

## 2011-03-14 DIAGNOSIS — R1013 Epigastric pain: Secondary | ICD-10-CM

## 2011-03-14 DIAGNOSIS — R630 Anorexia: Secondary | ICD-10-CM

## 2011-03-19 ENCOUNTER — Ambulatory Visit
Admission: RE | Admit: 2011-03-19 | Discharge: 2011-03-19 | Disposition: A | Discharge status: Home / Self Care | Payer: Medicare Other | Source: Ambulatory Visit | Attending: Internal Medicine | Admitting: Internal Medicine

## 2011-03-19 DIAGNOSIS — R630 Anorexia: Secondary | ICD-10-CM

## 2011-03-19 DIAGNOSIS — R634 Abnormal weight loss: Secondary | ICD-10-CM

## 2011-03-19 DIAGNOSIS — R1013 Epigastric pain: Secondary | ICD-10-CM

## 2011-03-20 NOTE — Procedures (Signed)
CAROTID DUPLEX EXAM   INDICATION:  Follow up carotid artery disease.   HISTORY:  Diabetes:  Yes.  Cardiac:  PTCA.  Hypertension:  Yes.  Smoking:  Previous.  Previous Surgery:  Bilateral carotid endarterectomy in 2001 with Dr.  Madilyn Fireman with left re-do in 2001 by Dr. Arbie Cookey.  CV History:  CVA.  Amaurosis Fugax No, Paresthesias No, Hemiparesis No.                                       RIGHT             LEFT  Brachial systolic pressure:         165               178  Brachial Doppler waveforms:         WNL               WNL  Vertebral direction of flow:        Antegrade         Antegrade  DUPLEX VELOCITIES (cm/sec)  CCA peak systolic                   87                76  ECA peak systolic                   146               97  ICA peak systolic                   285               91  ICA end diastolic                   85                30  PLAQUE MORPHOLOGY:                  Heterogenous      Heterogenous  PLAQUE AMOUNT:                      Moderate          Mild  PLAQUE LOCATION:                    ICA, ECA          ICA   IMPRESSION:  1. Right internal carotid artery suggests 60-79% stenosis.  2. Left internal carotid artery suggests 1-39% stenosis.  3. Antegrade flow in bilateral vertebrals.   ___________________________________________  Larina Earthly, M.D.   CB/MEDQ  D:  01/27/2010  T:  01/27/2010  Job:  [id number]

## 2011-03-20 NOTE — Procedures (Signed)
CAROTID DUPLEX EXAM   INDICATION:  Follow up carotid artery disease.   HISTORY:  Diabetes:  Yes.  Cardiac:  PTCA.  Hypertension:  Yes.  Smoking:  Previous.  Previous Surgery:  Bilateral carotid endarterectomy in 2001 with Dr.  Madilyn Fireman.  A left re-do in 2001 by Dr. Arbie Cookey.  CV History:  Amaurosis Fugax , Paresthesias , Hemiparesis                                       RIGHT             LEFT  Brachial systolic pressure:         162               174  Brachial Doppler waveforms:         WNL               WNL  Vertebral direction of flow:        Antegrade         Antegrade  DUPLEX VELOCITIES (cm/sec)  CCA peak systolic                   78                67  ECA peak systolic                   142               98  ICA peak systolic                   314               66  ICA end diastolic                   72                20  PLAQUE MORPHOLOGY:                  Heterogenous      Heterogenous  PLAQUE AMOUNT:                      Moderate          Mild  PLAQUE LOCATION:                    ICA, ECA          ICA, bifurcation   IMPRESSION:  1. Right internal carotid artery suggests 60% to 79% stenosis.  2. Left internal carotid artery suggests 1% to 39% stenosis.  3. Antegrade flow in bilateral vertebrals.       ___________________________________________  Larina Earthly, M.D.   OD/MEDQ  D:  08/08/2010  T:  08/08/2010  Job:  [id number]

## 2011-03-20 NOTE — Procedures (Signed)
CAROTID DUPLEX EXAM   INDICATION:  Follow up carotid artery disease.   HISTORY:  Diabetes:  Yes.  Cardiac:  PTCA.  Hypertension:  Yes.  Smoking:  Yes.  Previous Surgery:  Bilateral CEA in 2001 and left redo in 2001.  CV History:  Stroke before CEA, per patient.  Amaurosis Fugax No, Paresthesias No, Hemiparesis No.                                       RIGHT             LEFT  Brachial systolic pressure:         176               160  Brachial Doppler waveforms:         Biphasic          Biphasic  Vertebral direction of flow:        Antegrade         Antegrade  DUPLEX VELOCITIES (cm/sec)  CCA peak systolic                   74                59  ECA peak systolic                   101               102  ICA peak systolic                   225               84  ICA end diastolic                   56                25  PLAQUE MORPHOLOGY:                  Heterogenous      Heterogenous  PLAQUE AMOUNT:                      Moderate          Mild  PLAQUE LOCATION:                    ICA/ECA           ICA/ECA   IMPRESSION:  1. Right internal carotid artery shows evidence of 60-79% stenosis.  2. Left internal carotid artery shows no evidence of significant      restenosis, status post carotid endarterectomy.  3. No significant changes from previous study.   ___________________________________________  P. Liliane Bade, M.D.   AS/MEDQ  D:  08/26/2008  T:  08/27/2008  Job:  [id number]

## 2011-03-20 NOTE — Procedures (Signed)
CAROTID DUPLEX EXAM   INDICATION:  Follow-up carotid artery disease, patient concerned about  pressure build-up in neck moving into head for approximately 1 month.   HISTORY:  Diabetes:  Yes.  Cardiac:  PTCA.  Hypertension:  Yes.  Smoking:  Previous.  Previous Surgery:  Bilateral CEA in 2001 by Dr. Madilyn Fireman and left redo 2001  by Dr. Arbie Cookey.  CV History:  Stroke before her CEA.  Amaurosis Fugax No, Paresthesias No, Hemiparesis No                                       RIGHT               LEFT  Brachial systolic pressure:         155                 142  Brachial Doppler waveforms:         WNL                 WNL  Vertebral direction of flow:        Antegrade           Antegrade  DUPLEX VELOCITIES (cm/sec)  CCA peak systolic                   96                  87  ECA peak systolic                   171                 101  ICA peak systolic                   317                 82  ICA end diastolic                   81                  25  PLAQUE MORPHOLOGY:                  Heterogeneous       Heterogeneous  PLAQUE AMOUNT:                      Moderate/severe     Mild  PLAQUE LOCATION:                    ICA/ECA/bifurcation  Bifurcation/ICA/ECA   IMPRESSION:  1. Right internal carotid artery shows evidence of 60% to 79% stenosis      with an increase in velocity, however no category change.  2. Left internal carotid artery shows evidence of 1% to 39% stenosis.   ___________________________________________  Larina Earthly, M.D.   AS/MEDQ  D:  07/14/2009  T:  07/14/2009  Job:  [id number]

## 2011-03-20 NOTE — Assessment & Plan Note (Signed)
OFFICE VISIT   Kendra Todd, Kendra Todd  DOB:  August 30, 1941                                       01/27/2010  GUYQI#:[id number]   The patient presents today for continued followup for chronic  cerebrovascular occlusive disease.  She has been followed in our  noninvasive vascular lab protocol.  She denies any.  neurologic  deficits, amaurosis fugax, transient ischemic attack or stroke.  She  continues to be treated for hypertension.  She does also have cardiac  disease and non-insulin-dependent diabetes.  These are all relatively  stable.   FAMILY HISTORY:  Significant for premature atherosclerotic disease in  her father, sister and brother.   SOCIAL HISTORY:  She is married with three children.  She is retired.  She continues to smoke one-half pack of cigarettes per day and does not  drink alcohol on a regular basis.   REVIEW OF SYSTEMS:  Negative for weight loss or weight gain.  Weight is  reported at 178 pounds.  She is 5 feet 3 inches tall.  She denies any  other positives in her cardiac, pulmonary, GI, GU, vascular.  NEUROLOGIC:  She does report arthritic joint pain.  MUSCULOSKELETAL:  No depression, no ENT changes or hematologic changes.   PHYSICAL EXAM.:  Well-developed, well-nourished female appearing stated  age of 54.  Her blood pressure is 182/74, pulse 70, respirations 18.  Her temperature is 98.2.  She is in no acute distress.  HEENT:  Normal.  She has well-healed carotid incisions bilaterally with no bruits.  Chest  is clear.  Heart is regular rate and rhythm.  Abdomen is soft and  nontender with no masses.  Musculoskeletal:  Without major deformities  or cyanosis.  Neurologic:  No focal weakness or paresthesias.  Skin:  Without ulcers or rashes.   She did undergo repeat carotid duplex and I have reviewed this with her.  She had undergone a left carotid endarterectomy by Dr. Liliane Bade on  June 13, 2000 and she presented with left brain symptoms and  was taken  urgently to the operating room by myself on June 27, 2000 where she  was found to have a distal intimal flap.  She has done well since that  time.  She then subsequently underwent a right carotid endarterectomy by  Dr. Madilyn Fireman in October 2001 and has done well with this.  She has had no  neurologic deficits since that time.  Her duplex today is unchanged from  the most recent study 6 months ago and this does show 60%-79% right  carotid restenosis and no significant stenosis on the left.  She has  bilateral antegrade vertebral flow.  I discussed this at length with the  patient.  I recommend that we continue with our noninvasive vascular  protocol at 64-month intervals for follow-up of her right carotid  stenosis.  She will notify us should she develop any neurologic  deficits.     Larina Earthly, M.D.  Electronically Signed   TFE/MEDQ  D:  01/27/2010  T:  01/30/2010  Job:  [id number]   cc:   [name]. Chilton Si, M.D.

## 2011-03-20 NOTE — Consult Note (Signed)
VASCULAR SURGERY CONSULTATION   Kendra Todd, Kendra Todd  DOB:  1941-03-07                                       02/05/2008  HQION#:[id number]   PRIMARY CARE PHYSICIAN:  [name]. Chilton Si, M.D.   REASON FOR CONSULTATION:  Recurrent right internal carotid artery  stenosis.   HISTORY:  Patient is a 70 year old female with a history of bilateral  carotid endarterectomies carried out in 2001.  Prior to procedure, I was  noted to involve extensive plaque and quite distal endarterectomy.   She was recently seen and evaluated by Dr. Lucky Cowboy with painful  swelling in her left neck.  CT scan ultimately revealed this to be  likely carotid inflammation, and she was treated with antibiotics and  improvement.   Also noted on the CT scan was evidence of recurrent bilateral internal  carotid artery and bifurcation plaque.  Estimated stenosis of right  internal carotid artery was approximately 60-70%.  These were non-CTA  images.   Patient had been free of any neurologic symptoms.  Denies sensory,  motor, or visual deficit.  No further neck pain.   Her risk factors for cerebrovascular disease include type 1 diabetes,  coronary artery disease, hypertension, hyperlipidemia.   MEDICATIONS:  1. Spironolactone 25 mg daily.  2. Glyburide 5 mg daily.  3. Avandia 8 mg daily.  4. Diovan 160/25 daily.  5. Crestor 20 mg daily.  6. Metoprolol 50 mg daily.  7. Tylenol #3 p.r.n.   PAST MEDICAL HISTORY:  1. Type 2 diabetes.  2. Hyperlipidemia.  3. Hypertension.  4. Coronary artery disease.  5. Cerebrovascular disease.   ALLERGIES:  None known.   SOCIAL HISTORY:  Patient is married.  Her husband is in a nursing home  secondary to MS.  She smokes less than a half pack of cigarettes per  day.  Does not consume alcohol.  Retired from [employer] in [employer] [place].   REVIEW OF SYSTEMS:  Refer to patient encounter form.  This was reviewed  today.  Current complaints include weight gain,  shortness of breath with  exertion, and joint discomfort.  She denies chest pain or palpitations.  No cough or sputum production.  No change in bowel habits.  No abdominal  pain.  No dysuria or frequency.  Denies claudication symptoms.  No  syncope or presyncope.  No visual, sensory, or motor deficit.  No change  in eye sight or hearing.  Denies bleeding problems.  No rashes or  ulcers.   PHYSICAL EXAMINATION:  Generally a well-appearing 70 year old [place] female.  Alert and oriented.  No acute distress.  HEENT:  Mouth  and throat are clear.  Normocephalic.  Extraocular movements are intact.  No scleral icterus.  Neck:  Supple.  No thyromegaly or adenopathy.  Chest:  Equal air entry bilaterally.  No rales or rhonchi.  Normal  percussion.  Cardiovascular:  No carotid bruits.  Normal heart sounds  without murmurs.  No gallops or rubs.  Regular rate and rhythm.  Abdomen:  Soft and nontender.  No masses or organomegaly.  Normal bowel  sounds without bruits.  Extremities:  Full range of motion.  No ankle  edema.  Neurologic:  Cranial nerve are intact.  Good strength, equal  bilaterally.  Reflexes are 2+.  Normal sensation.  Skin:  Warm, dry,  intact.  No ulceration or rash.   INVESTIGATIONS:  Carotid duplex exam carried out at this time reveals a  60-79% right ICA stenosis, normal duplex scan in the left internal  carotid artery.  Antegrade vertebral arteries bilaterally.   IMPRESSION:  1. Moderate recurrent right internal carotid artery stenosis without      symptoms at 60-79%.  2. Type 2 diabetes.  3. Hypertension.  4. Coronary artery disease.  5. Hyperlipidemia.   RECOMMENDATIONS:  Patient requires no further investigation at this time  for asymptomatic moderate recurrent right internal carotid artery  stenosis.  Potential symptoms of neurologic events, including sensory,  motor, or visual deficit reviewed with the patient and to contact the  office should these occur.   Plan followup in six months with a carotid  Doppler evaluation.   Chronic medical conditions, including type 2 diabetes, hypertension,  coronary artery disease, and hyperlipidemia are well controlled at this  time.   Balinda Quails, M.D.  Electronically Signed  PGH/MEDQ  D:  02/05/2008  T:  02/06/2008  Job:  [id number]   cc:   [name]. Chilton Si, M.D.  Antionette Char, MD  Lucky Cowboy, MD

## 2011-03-20 NOTE — Procedures (Signed)
CAROTID DUPLEX EXAM   INDICATION:  Followup carotid artery disease.   HISTORY:  Diabetes:  Yes.  Cardiac:  PTCA.  Hypertension:  Yes.  Smoking:  Quit.  Previous Surgery:  Bilateral ECA in 2001 and left redo in 2001.  CV History:  Stroke before CEA.  Amaurosis Fugax No, Paresthesias No, Hemiparesis No                                       RIGHT             LEFT  Brachial systolic pressure:         184               192  Brachial Doppler waveforms:         Biphasic          Biphasic  Vertebral direction of flow:        Antegrade         Antegrade  DUPLEX VELOCITIES (cm/sec)  CCA peak systolic                   94                93  ECA peak systolic                   147               125  ICA peak systolic                   255               96  ICA end diastolic                   54                21  PLAQUE MORPHOLOGY:                  Heterogenous      Heterogenous  PLAQUE AMOUNT:                      Moderate          Moderate  PLAQUE LOCATION:                    Bif/ICA/ECA       Bif/ICA/ECA   IMPRESSION:  1. 60-79% right internal carotid artery restenosis.  2. 20-39% left internal carotid artery restenosis.       ___________________________________________  P. Liliane Bade, M.D.   AC/MEDQ  D:  01/13/2009  T:  01/13/2009  Job:  [id number]

## 2011-03-20 NOTE — Procedures (Signed)
CAROTID DUPLEX EXAM   INDICATION:  Follow up known carotid artery disease.   HISTORY:  Diabetes:  Yes.  Cardiac:  PTCA.  Hypertension:  Yes.  Smoking:  Yes.  Previous Surgery:  Bilateral carotid endarterectomy in 2001 and left  carotid endarterectomy re-do in 2001.  CV History:  Amaurosis Fugax No, Paresthesias No, Hemiparesis No                                       RIGHT             LEFT  Brachial systolic pressure:         160               140  Brachial Doppler waveforms:         Biphasic          Biphasic  Vertebral direction of flow:        Antegrade         Antegrade  DUPLEX VELOCITIES (cm/sec)  CCA peak systolic                   79                76  ECA peak systolic                   118               158  ICA peak systolic                   232               61  ICA end diastolic                   79                16  PLAQUE MORPHOLOGY:                  Heterogeneous     None  PLAQUE AMOUNT:                      Moderate          None  PLAQUE LOCATION:                    ICA and ECA       ECA   IMPRESSION:  1. Stenosis 60-79% noted in the right ICA.  2. Normal carotid duplex noted in the left ICA.  3. Antegrade bilateral vertebral arteries.   ___________________________________________  P. Liliane Bade, M.D.   MG/MEDQ  D:  02/05/2008  T:  02/05/2008  Job:  [id number]

## 2011-03-23 NOTE — H&P (Signed)
[facility]  Patient:    Kendra Todd, Kendra Todd                          MRN: [id number] Adm. Date:  [id number] Attending:  Fenton Malling                         History and Physical  DATE OF BIRTH:  Dec 03, 1940  CHIEF COMPLAINT:  Right-sided weakness and numbness.  HISTORY OF PRESENT ILLNESS:  A 69 year old black female with history of hypertension, diabetes, coronary artery disease status post PTCA x2 who had a syncopal spell in June in which she got woozy and passed out in the beauty shop.  Workup for this included carotid Doppler which revealed fairly severe bilateral internal carotid stenosis.  She underwent a left carotid endarterectomy on August 9 without any problems.  The patient reports she has had fairly severe left-sided headaches since that time.  On the morning of admission around noon, she experienced sudden onset of weakness in the right hand that gradually worsened over the course of the day, and about 5 p.m. she noticed dysarthria of relatively sudden onset associated with gradual onset of weakness of the right leg.  Headache has been particularly severe today.  This evening the symptoms had improved somewhat.  She denies ever having experienced symptoms like this before.  PAST MEDICAL HISTORY:  Remarkable for hypertension, non-insulin-dependent diabetes, coronary artery disease status post PTCA x2, and history of back surgery.  FAMILY HISTORY:  Remarkable for stroke, MI, CHF and cancer.  SOCIAL HISTORY:  She smokes 1-1/2 packs a day.  She denies alcohol use.  She lives with her husband who has multiple sclerosis and is his primary caretaker.  MEDICATIONS:  Aspirin, Glucotrol, Baycol and Altace.  REVIEW OF SYSTEMS:  Negative except for the headache and photophobia as above.  PHYSICAL EXAMINATION:  VITAL SIGNS:  Temperature 98.2, blood pressure 200 to 270 over 92 to 116, pulse 95 to 116, respirations 20, O2 saturation 100% on room  air.  GENERAL:  She is alert.  She appears uncomfortable, but is in no evident distress.  HEENT:  Head:  Cranium was normocephalic, atraumatic.  Oropharynx benign.  NECK:  Supple without bruits.  There is no hematoma over the endarterectomy site.  HEART:  Tachycardic with regular rhythm, without murmurs.  CHEST:  Clear to auscultation.  NEUROLOGIC:  Mental status:  She is awake, alert and fully oriented.  Speech is very slightly dysarthric.  She was able to give a very concise history. Cranial nerves:  Funduscopic exam is benign.  Pupils are equal and briskly reactive.  Visual fields are full to confrontation.  Extraocular movements are normal.  Facial strength and sensation are normal.  Tongue and palate move in the midline.  Motor exam:  Mild weakness of finger extension, intrinsic hand muscles and hip flexion weakness on the right.  Otherwise, normal bulk, strength and tone throughout.  Sensation decrease light touch and vibration in the right hand and foot, otherwise, normal.  Rapid alternating movements are slightly slow on the right.  Cerebellar function is normal.  Deep tendon reflexes are symmetric.  Toes are downgoing.  Gait exam is deferred.  LABORATORY DATA:  BMET is remarkable for glucose 248, AST and ALT are normal, total bilirubin normal.  Coags normal.  Troponin I and CK are negative.  CBC with differential is normal.  CT of the  head was personally reviewed and reveals no evidence of any acute process and on evidence of bleed.  IMPRESSION:  Left hemispheric ischemia status post left endarterectomy approximately two weeks ago and multiple risk factors for stroke. Possibilities include new infarction versus restenosis at the endarterectomy site.  PLAN: 1. Angiogram tonight. 2. If positive, will need repeat surgery. 3. If negative, will check MRI and start heparin. DD:  06/26/00 TD:  06/27/00 Job: [id number]

## 2011-03-23 NOTE — Cardiovascular Report (Signed)
[place]. [facility]  Patient:    Kendra Todd, Kendra Todd Visit Number: [id number] MRN: [id number]          Service Type: CAT Location: [id number] Attending Physician:  Silvestre Mesi Dictated by:   Aram Candela. Aleen Campi, M.D. Proc. Date: 07/24/01 Admit Date:  07/24/2001   CC:         Cardiac Catheterization Laboratory   Cardiac Catheterization  PROCEDURES PERFORMED: 1. Left heart catheterization. 2. Coronary cineangiography. 3. Left ventricular cineangiography. 4. Abdominal aortogram. 5. Angioplasty with percutaneous transluminal coronary angiography and    stenting of the right coronary artery, two lesions. 6. Perclose of the right femoral artery.  INDICATION FOR PROCEDURES:  This 70 year old female has a history of single-vessel coronary artery disease status post coronary artery angioplasty of her distal circumflex coronary artery in 1993.  She has done very well since then, until the recent onset of chest pain and dyspnea on exertion.  She has a history of difficult to control hypertension and also a history of hypercholesterolemia and diabetes.  She had a rest stress Cardiolite study done at [facility] and the test was markedly abnormal with marked abnormality on her electrocardiogram, as well as ischemia noted on the Cardiolite study on the inferolateral myocardium.  DESCRIPTION OF PROCEDURE:  After signing an informed consent, the patient was premedicated with 50% of Benadryl intravenously and brought to the cardiac catheterization laboratory.  Her right groin was prepped and draped in a sterile fashion and anesthetized locally with 1% Lidocaine.  A 6-French introducer sheath was inserted percutaneously into the right femoral artery.  Six [place] #4 Judkins coronary catheters were used to make injections into the coronary arteries.  A 6-French pigtail catheter was used to measure pressures in the left ventricle and aorta and to  make midstream injections into the left ventricle and abdominal aorta.  After noting critical stenosis in her right coronary artery in the ostium and also a second lesion in the mid right coronary artery, we discussed this finding with the patient along with the finding of a very good appearance of her PTCA site in the circumflex from 1993.  After discussing alternatives, we elected to proceed with angioplasty of the new critical lesion to the right coronary artery.  We then selected a 6-French JL-4 guide catheter with side-holes, which was advanced to the root of the aorta.  After inserting a short high-torque floppy guide wire into the guide catheter, the tip of the guide catheter was engaged in the ostium of the right coronary artery and a guide wire was advanced through the ostial lesion and also through the mid right coronary artery lesion and positioned in the distal segment.  We then selected a 2.5 x 15 mm angioplasty balloon catheter, which was advanced over the guide wire and positioned within the proximal lesion.  Two inflations were made in the ostial lesion with maximum pressure of 14 atm and a maximum time of 30 seconds.  We then advanced the balloon catheter into the mid right coronary lesion and two inflations were made with a maximum pressure of 12 atm and a maximum time of 30 seconds.  We then selected a 2.5 x 20 mm Express-II monorail stent deployment system and after proper preparation, this was inserted over the guide wire and advanced over the guide wire into the right coronary artery and positioned within the mid right coronary artery lesion.  The stent was then deployed with one inflation at  15 atm for 50 seconds.  After the first stent was deployed in the mid right coronary artery lesion, the deployment balloon was removed and we then selected a 2.75 x 8 mm Express-II monorail stent deployment system, which was advanced over the guide wire again and  positioned within the ostial right coronary artery lesion.  This stent was then deployed with one inflation with a maximum inflation of 20 atm and a maximum time of 41 seconds.  After this final inflation, the deployment balloon was removed and injections again in the right coronary artery showed an excellent angiographic result in both lesions with 0% residual lesion and no evidence for dissection or clot.  There was reestablishment of normal TIMI-3 antegrade flow.  Prior to the angioplasty procedure, there was TIMI-1 antegrade flow.  The patient tolerated the procedure well and no complications were noted.  At the end of the procedure, the catheter and sheath were removed from the right femoral artery and hemostasis was easily obtained with a Perclose closure system.  MEDICATIONS GIVEN:  Versed 1 mg IV, Versed 2 mg IV, morphine 2 mg IV, Integrilin per pharmacy protocol, heparin 3900 units IV.  HEMODYNAMIC DATA:  Left ventricular pressure 227/0-11.  Aortic pressure 226/93, with a mean of 147.  Left ventricular ejection fraction was estimated at between 60% and 70%.  CINE FINDINGS:  CORONARY CINEANGIOGRAPHY: Left coronary artery:  The ostium and left main appear normal.  Left anterior descending:  The LAD has moderate irregularities throughout the proximal and middle segments with probable segmental plaque throughout, causing mild stenosis of less than 40%.  There is normal antegrade flow.  The diagonal branches appear normal.  Circumflex coronary artery:  The proximal segment appears normal.  The middle segment is mildly irregular.  The site of the prior angioplasty procedures in 1991 and in 1993, now appears normal with 0% residual stenosis and normal antegrade flow.  There is a distal obtuse marginal branch which has a 60-70% focal stenosis in its middle segment.  There is normal antegrade flow.  Right coronary artery:  The right coronary artery is severely abnormal  and changed since the prior study in 1993.  She now has a critical greater than  95% ostial stenosis and a critical 95% stenosis in the mid right coronary artery located between the right ventricular branch and the acute angle.  At the acute angle, the posterior descending had its takeoff and supplies the proximal half of the posterior descending distribution.  The right coronary artery in the AV groove is a medium sized vessel which supplies a portion of the posterolateral circulation to the left ventricle.  There was very poor antegrade flow through the two critical stenotic lesions, with TIMI-1 antegrade flow during the diagnostic injections.  Collateral flow was seen in the distal right coronary artery during injections into the left coronary artery.  LEFT VENTRICULAR CINEANGIOGRAM:  The left ventricular chamber size appears normal.  The contractility pattern is hyperdynamic with an ejection fraction of 70%.  The left ventricular wall thickness is prominent with moderate thickening.  The mitral and aortic valves appear normal.  ABDOMINAL AORTOGRAM:  The suprarenal abdominal aorta appears normal.  The renal arteries also appear normal.  The infrarenal abdominal aorta is abnormal with moderate to severe irregular plaque causing a 50% focal stenosis in the distal segment.  There is good antegrade runoff.  ANGIOPLASTY CINES:  Cines taken during the angioplasty procedure showed proper positioning of the guide wire and initial predilation balloon  with a good balloon form obtained.  Further cines showed proper positioning of the stent deployment system, with a very good placement of both stents and final injections into the right coronary artery showed an excellent angiographic result with 0% residual lesion, reestablishment of normal antegrade flow, and no evidence for clot or dissection.  FINAL DIAGNOSES: 1. New critical lesions in the right coronary artery, ostial and middle     segments, greater than 95%. 2. Very good long-term appearance of the percutaneous transluminal coronary    angiography site in the distal circumflex coronary artery from 1993. 3. Normal left ventricular function. 4. Mild to moderate concentric left ventricular hypertrophy. 5. Irregular plaque in the distal abdominal aorta. 6. Normal renal arteries. 7. Successful percutaneous transluminal coronary angiography and stenting    of the ostial and mid right coronary artery lesions. 8. Successful Perclose of the right femoral artery.  DISPOSITION:  The patient was admitted to the [facility] for monitoring overnight and continuation of the Integrilin drip per pharmacy protocol.  We will plan on discharging her early in the a.m. and we will continue her on Plavix and aspirin as an outpatient. Dictated by:   Aram Candela. Aleen Campi, M.D. Attending Physician:  Silvestre Mesi DD:  07/24/01 TD:  07/24/01 Job: [id number]

## 2011-03-23 NOTE — Discharge Summary (Signed)
[place]. [facility]  Patient:    Kendra Todd, Kendra Todd                          MRN: [id number] Adm. Date:  [id number] Disc. Date: [id number] Attending:  Melvenia Needles Dictator:   Lissa Hoard, P.A.-C                           Discharge Summary  DATE OF BIRTH:  02-09-1941  ADMITTING DIAGNOSIS:  Severe left carotid artery stenosis.  DISCHARGE DIAGNOSIS:  Status post left carotid endarterectomy with Dacron patch angioplasty.  ADMISSION HISTORY AND PHYSICAL:  Kendra Todd is a 70 year old female with a known history of bilateral carotid artery stenosis.  She was scheduled to have a right carotid endarterectomy; however, she suffered a left parietal known hemorrhagic stroke, so she had to undergo emergency redo of left carotid endarterectomy.  She recuperated from that procedure well and now presents to the office anxiety to proceed with right carotid endarterectomy.  Her last Doppler showed that she still had some left carotid artery stenosis.  At this time, she denies any weakness, paraesthesia, facial droop, or difficulty standing or walking.  PHYSICAL EXAMINATION:  VITAL SIGNS:  Blood pressure 160/80 bilaterally, pulse 78 and regular, respirations 16.  HEENT:  Within normal limits.  NECK:  Supple without any JVD; however, there was a left subclavian bruit as well as bilateral carotid bruits, more pronounced on the left than the right. No thyromegaly, lymphadenopathy was noted.  LUNGS:  Clear to auscultation bilaterally.  CARDIOVASCULAR:  Regular rate and rhythm without murmur, rub or gallop.  ABDOMEN:  Soft, nontender, positive bowel sounds in all four quadrants.  EXTREMITIES:  No cyanosis or clubbing; however, there was trace edema noted bilaterally.  NEUROLOGIC:  Grossly intact.  HOSPITAL COURSE:  August 14, 2000, the patient underwent a left carotid endarterectomy with Dacron patch angioplasty and intraoperative arteriogram. The patients  procedure was preformed by Dr. Madilyn Fireman under general endotracheal anesthesia.  No complications were noted during the procedure.  It should be noted that in the PACU, the patient was noted to have tongue deviated to the right; however, she did have good strength bilaterally.  It was felt that her tongue deviation was secondary to having to retract the right hypoglossal and glossopharyngeal nerves.  The patient underwent swallowing evaluation on postoperative day #1, August 15, 2000.  After swallowing evaluation, the patient was started on a pureed diet.  This was done secondary to difficulty swallowing.  The patient remained on the pureed diet for the next two postoperative days.  She remained neurologically stable without any further deficits noted.  Her swallowing began to improve.  By postoperative day #4, her diet was advanced to chopped meats.  She remained stable throughout the remainder of her hospital stay and was eventually discharged home on Postoperative day #6, August 20, 2000.  DISCHARGE MEDICATIONS: 1. Glucotrol 10 mg 1 tablet daily. 2. Altace 5 mg 1 tablet daily. 3. Aspirin 325 mg 1 tablet daily. 4. Zocor which she was instructed to take this at her previous home dose.  DISCHARGE ACTIVITY:  The patient was told to avoid driving and strenuous activity.  DISCHARGE DIET:  The patient was instructed to consume the pureed diet with chopped meat diet.  DISCHARGE DISPOSITION:  Home.  DISCHARGE FOLLOW-UP:  The patient is instructed to follow up with Dr. Madilyn Fireman in two  weeks and was told that the office would call her to verify the time and day of this appointment. DD:  09/23/00 TD:  09/24/00 Job: [id number]

## 2011-03-23 NOTE — Discharge Summary (Signed)
[place]endra Todd. [facility]  Patient:    Kendra, Todd                          MRN: [id number] Adm. Date:  [id number] Disc. Date: [id number] Attending:  Fenton Malling Dictator:   Carlye Grippe.                           Discharge Summary  HISTORY OF PRESENT ILLNESS:  This is a 70 year old black female with history of diabetes and hypertension as well as coronary artery disease who was referred to Dr. Madilyn Fireman for evaluation of newly diagnosed carotid artery stenosis.  Ms. Kendra Todd was at a beauty shop in June 2001 when she felt pressure in her head and neck which was followed by a syncopal episode.  Doppler studies were done by Dr. ______ and this showed internal carotid artery stenosis and subsequently she was referred to Dr. Madilyn Fireman for surgical opinion. A repeat carotid Doppler exam showed extensive plaque disease bilaterally with bilateral high-grade internal carotid artery stenosis.  Dr. Madilyn Fireman ordered an MR angiogram and after review recommended left carotid endarterectomy and she was admitted this hospitalization for the procedure.  PAST MEDICAL HISTORY: 1. Non-insulin-dependent diabetes. 2. Coronary artery disease with myocardial infarction x 2 in 1984 and 1982 as    well as history of angioplasty. 3. Hypertension. 4. Back pain with history of multiple previous surgeries.  PAST SURGICAL HISTORY: 1. Angioplasty 1984 and 1982. 2. History of kidney stone removal. 3. History of cholecystectomy. 4. History of three previous back surgeries.  FAMILY HISTORY/SOCIAL HISTORY/REVIEW OF SYSTEMS/PHYSICAL EXAMINATION:  Please see the history and physical done at the time of admission.  HOSPITAL COURSE:  The patient was admitted on June 13, 2000 and underwent left carotid endarterectomy with Dacron patch angioplasty.  She tolerated the procedure well and was taken to the recovery room in stable condition. Postoperative hospital course patient did well.  She awoke  neurologically intact.  She did have some moderate neck soreness as well as nausea and dizziness in the postoperative period but this dissipated and she was overall felt to be quite stable for discharge on June 15, 2000.  Her incisions were healing well without signs of bleeding or infection.  Her laboratory values were stable.  Her neurological examination was stable.  Her hemodynamics were stable.  She was feeling well and discharged on June 15, 2000.  DISCHARGE MEDICATIONS: 1. Baycol 0.8 mg q.d. 2. Glucotrol 5 mg q.d. 3. Altace 5 mg q.d. 4. Enteric-coated aspirin 325 mg q.d.  FOLLOW-UP:  Two weeks with Dr. Madilyn Fireman.  CONDITION ON DISCHARGE:  Stable and improved.  FINAL DIAGNOSES: 1. Recently diagnosed bilateral internal carotid artery stenosis. 2. Coronary artery disease. 3. Non-insulin-dependent diabetes. 4. Hypertension. 5. Previous back surgeries. 6. Previous renal stone surgery. 7. History of cholecystectomy.  DISCHARGE INSTRUCTIONS:  The patient received written instructions in regard to medications, activity, diet, wound care, and followup. DD:  07/31/00 TD:  07/31/00 Job: [id number]

## 2011-03-23 NOTE — Op Note (Signed)
[place]. [facility]  Patient:    Kendra Todd, Kendra Todd                          MRN: [id number] Proc. Date: 08/14/00 Adm. Date:  [id number] Attending:  Melvenia Needles                           Operative Report  PREOPERATIVE DIAGNOSIS:  High-grade right internal carotid artery stenosis.  POSTOPERATIVE DIAGNOSIS:  High-grade right internal carotid artery stenosis.  PROCEDURE: 1. Right carotid endarterectomy, Dacron patch angioplasty. 2. Intraoperative right carotid arteriogram.  SURGEON:  John C. Madilyn Fireman, M.D.  ASSISTANTS:  Mikey Bussing, M.D., and [facility]  ANESTHESIA:  General endotracheal.  ANESTHESIOLOGIST:  Judie Petit, M.D.  CLINICAL NOTE:  This is a 70 year old female with severe bilateral internal carotid artery stenosis.  She is status post left carotid endarterectomy.  She is brought to the operating room at this time for right carotid endarterectomy.  Risks and benefits of this operative procedure were explained to the patient in detail and include but are not limited to MI, CVA, and death, with a risk of approximately 2%.  The patient consented for surgery.  DESCRIPTION OF PROCEDURE:  Patient brought to the operating room in stable condition.  Placed in the supine position.  General endotracheal anesthesia induced.  Arterial line and Foley catheter inserted.  Right neck prepped and draped in a sterile fashion.  Curvilinear skin incision made along the anterior border of the right sternomastoid muscle.  Incision extended deeply through the subcutaneous tissue.  Dissection was carried down through the platysma with electrocautery. Deep dissection carried along the anterior border of the sternomastoid muscle. The facial vein ligated with 3-0 silk and divided.  The right carotid bifurcation exposed.  The right carotid bifurcation was noted to be high in the neck.  The right common carotid artery was dissected proximally  down to the posterior belly of the digastric muscle, where it was encircled with a vessel loop.  The vagus nerve reflected posteriorly and preserved.  Distal dissection carried up to the bulb, where the superior thyroid and external carotid were encircled with vessel loops.  The internal carotid artery was exposed distally.  The hypoglossal nerve was mobilized and encircled with a vessel loop and retracted superiorly.  The ansa cervicalis was divided.  The plaque in the right internal carotid artery was palpable and extended a significant distance up into the internal carotid artery above the level of the posterior belly of the digastric muscle and hypoglossal nerve.  The posterior belly of the digastric muscle was retracted.  The distal internal carotid artery was dissected out and encircled with a vessel loop.  The patient was administered 7000 units of heparin intravenously.  Adequate circulation time permitted.  The carotid vessels were controlled with clamps. A longitudinal arteriotomy was made in the distal common carotid artery. There was a high-grade 90% right internal carotid artery stenosis.  Calcified plaque was noted.  The arteriotomy was extended across the carotid bulb and up into the internal carotid artery.  The plaque extended distally several centimeters.  A shunt was inserted.  An endarterectomy elevator used to raise the plaque from the underlying vessel.  The endarterectomy carried down into the common carotid artery, where the plaque was divided transversely with Potts scissors.  The plaque then removed from the superior thyroid and external  carotid using an eversion technique.  The plaque was then raised up into the internal carotid artery distally.  The plaque was noted to extend well up into the internal carotid artery.  The end point was difficult to obtain distally, but the plaque eventually did feather out and an adequate end point was apparent.  Due to  the small size of the internal carotid artery, the shunt was removed. The vessels were re-clamped.  A patch angioplasty of the endarterectomy site was carried out using running 6-0 Prolene suture.  Once the patch angioplasty was completed, all vessels were well-flushed.  Clamps were removed, directing initial antegrade flow up the external carotid artery.  Following this, the internal carotid was released.  The patient was administered 50 mg of protamine intravenously.  At this time, a Doppler signal was audible in the internal carotid artery distally and a palpable pulse present.  However, the Doppler signal seemed to be disturbed.  An on-table arteriogram was then obtained by injection of 20 cc of contrast solution.  This revealed a possible web-like flap at the distal endarterectomy margin.  The patient received further 7000 units of heparin intravenously.  An additional 2000 units was administered when the ACT was 179.  The carotid vessels were re-clamped.  The patch was taken off the carotid artery.  There was no thrombus evident.  The right internal carotid artery back-bled well.  The skin incision was then further extended distally in the neck.  Significant retraction had to be placed on the digastric muscle.  Both the right hypoglossal and right glossopharyngeal nerves did have to be retracted but were preserved.  The arteriotomy in the internal carotid artery was further extended distally very close to the base of the skull.  The area of flap creation was identified, and further intima was removed and assured that any remaining intima was well tacked down.  The patch angioplasty was then repeated with a _____ knitted Dacron patch using running 6-0 Prolene suture.  At completion, all vessels again well-flushed.  Clamps were removed, directing initial antegrade flow up the external carotid artery.  Following this, the right internal carotid artery was released.  Excellent  pulse and Doppler signal of the distal internal carotid artery.  An on-table arteriogram was again repeated, and this revealed  no evidence of residual flap at the distal margin of the endarterectomy site. Brisk flow was present into the intracranial vessels.  At this time the patient received an additional 50 mg of protamine intravenously.  Adequate circulation time permitted.  Adequate hemostasis obtained.  The sponge and instrument counts were correct.  The right neck was then drained with a round Jackson-Pratt drain exited inferiorly through the skin, fixed to the skin with a 2-0 silk suture.  The sternomastoid fascia was then closed with running 2-0 Vicryl suture.  The platysma closed with a running 3-0 Vicryl suture.  Skin closed with 4-0 Monocryl.  Half-inch Steri-Strips applied.  Anesthesia was then slowly reversed.  The patient did arouse and moved all extremities to command.  She was transferred to the recovery room with intact movement in all extremities. DD:  08/14/00 TD:  08/16/00 Job: [id number]

## 2011-03-23 NOTE — Discharge Summary (Signed)
Kendra Todd  Patient:    Kendra Todd, Kendra Todd                          MRN: [id number] Adm. Date:  [id number] Disc. Date: 06/30/00 Attending:  Fenton Todd Dictator:   Kendra Todd, P.A. CC:         Kendra Todd, M.D.   Discharge Summary  DATE OF BIRTH:  11-27-1940  ADMISSION DIAGNOSES: 1. Left hemispheric ischemia, status post left carotid endarterectomy    two weeks ago versus new cerebrovascular accident with secondary    re-stenosis at the endarterectomy site. 2. Hypertension. 3. Adult onset diabetes mellitus, non-insulin dependent. 4. Coronary artery disease, status post percutaneous transluminal coronary    angioplasty x 2. 5. Ongoing tobacco use.  DISCHARGE DIAGNOSES: 1. Left parietal nonhemorrhagic stroke with intimal web along the    distal endarterectomy site. 2. A 95% stenosis, right internal carotid artery origin. 3. Hypertension. 4. Adult onset diabetes mellitus, non-insulin dependent. 5. Coronary artery disease, status post percutaneous transluminal coronary    angioplasty x 2. 6. Ongoing tobacco use.  PROCEDURES: 1. Arch and bilateral carotid arteriograms, June 26, 2000. 2. CT of the brain, June 26, 2000, and June 27, 2000. 3. MRI of the brain, June 27, 2000. 4. Re-do left carotid endarterectomy with Dacron patch angioplasty,    June 27, 2000, in the early a.m. completed around 2 p.m.  BRIEF HISTORY:  The patient is a 70 year old black female with a history of hypertension, diabetes, coronary artery disease, status post PTCA x 2, who had a syncopal spell in June in which she got woozy and passed out in a beauty shop.  Work-up including Doppler studies revealed fairly severe bilateral ICA stenosis.  She underwent a left carotid endarterectomy on June 13, 2000, without any problems.  The patient reports she had very severe left-sided headaches since the surgery along with nausea and vomiting.  She  attributed this partially to the Kendra Todd.  On the morning of admission, around noon, she had a sudden onset of weakness in her right hand that gradually worsened over the course of the day, and about 5 p.m. she noted dysarthria of a relatively sudden onset associated with gradual onset of weakness of the right leg. Headache had also been particularly severe that day.  She presented to [facility] Todd in the evening, somewhat improved.  She denied ever having symptoms like this before.  She was seen by Dr. Kelli Todd of the neurology service and subsequently admitted for further evaluation and treatment of her symptoms.  PAST MEDICAL HISTORY:  Remarkable for hypertension, non-insulin dependent diabetes mellitus, coronary artery disease with PTCA x 2, and a history of back surgery.  SOCIAL HISTORY:  She smokes one and a half packs-per-day.  Denies alcohol. Lives with her husband, who has multiple sclerosis and is his primary care taker.  MEDICATIONS ON ADMISSION:  Aspirin, Glucotrol, Baycol, and Altace.  For further history and physical, please see the dictated note.  HOSPITAL COURSE:  The patient was admitted and taken to x-ray where she underwent CT scan of brain, arch arteriogram and bilateral carotid arteriograms.  The studies revealed a probable web intimal flap at the distal endarterectomy site, and it was Dr. Nicholes Todd opinion this was probably the source of her symptoms.  He recommended re-do left carotid endarterectomy at that time, and the patient was taken to the operating room in the early hours of June 27, 2000, at which time she underwent a re-do left carotid endarterectomy and Dacron patch angioplasty.  She tolerated the procedure well and returned to the recovery room in the step-down care unit in satisfactory condition.  She was followed throughout her course by the neurology service and Dr. Thad Todd associates.  A 2-D echocardiogram study was also done  on June 27, 2000.  This showed an overall left ventricular systolic function was normal.  Estimated LV ejection fraction was 60%.  There was a small ventricular regional wall motion abnormalities and the left ventricle wall thickness was moderately increased.  There was no ventricular thrombus identified and the aortic valve was mildly calcified but opened well.  Followup CT showed what appeared to be a small parietal stroke. Postoperatively, the patient had difficulty especially with her right arm. The first postoperative morning she could not do finger-to-nose.  Her strength was diminished on the right side.  She had no trouble swallowing and was overall comfortable from her surgery.  She was hemodynamically stable and overall was doing well.  Her examination by the neurology service, again was consistent with a left parietal embolic stroke.  Stroke team was contacted.  She was seen by OT, PT and speech therapy.  She had no problems with her swallowing and speech signed off.  She has had ongoing PT and OT therapy.  She was restarted on her preadmission medicines postoperatively and has done well.  Her right-sided upper extremity weakness has improved on a daily basis.  She has been mobilized and has made good progress and by the a.m. of June 30, 2000, was anxious for discharge.  The case manager is currently arranging home PT and OT for the patient.  Her daughter is coming down to stay with her, and overall it was Dr. Quillian Todd opinion she was ready for discharge in the a.m. of June 30, 2000.  Her wound looks good.  Her chest is clear.  Neurologically she still has some weakness on the right side and her right upper arm, but her grip is markedly improved.  She has fine and gross motor function is almost back to normal.  We will plan to let her go home.  DISCHARGE INSTRUCTIONS:  She will go home on her preadmission medicines as before.  She will have Kendra Todd one or two p.o. q.4h.  p.r.n. for pain and she will return to see Dr. Madilyn Todd in two weeks.  Neurologic followup with  Dr. Thad Todd and associates for any return of her neurologic symptoms.  CONDITION ON DISCHARGE:  Improved. DD:  06/30/00 TD:  06/30/00 Job: [id number]

## 2011-03-23 NOTE — Op Note (Signed)
[facility]  Patient:    Kendra Todd, Kendra Todd                          MRN: [id number] Proc. Date: 06/27/00 Adm. Date:  [id number] Attending:  Kelli Hope L                           Operative Report  PREOPERATIVE DIAGNOSIS:  Symptomatic left carotid stenosis following recent left carotid endarterectomy.  POSTOPERATIVE DIAGNOSIS:  Symptomatic left carotid stenosis following recent left carotid endarterectomy.  PROCEDURE:  Left neck exploration, distal intimal flap endarterectomy and repatch angioplasty closure.  SURGEON:  Dr. Tawanna Cooler Early.  ASSISTANT:  Maxwell Marion, RNFA.  ANESTHESIA:  General endotracheal.  COMPLICATIONS:  None.  DISPOSITION:  To recovery room neurologically intact.  DESCRIPTION OF PROCEDURE:   The patient is a 70 year old white female who is status post left carotid endarterectomy on June 13, 2000 by Dr. Liliane Bade in [facility]. The patient did well initially and was discharged to home. She called on August 22 at 5 p.m. stating that she had had an episode of aphasia and right sided weakness. She was instructed to report immediately to the emergency department. She had nearly cleared with mild right residual weakness and clearance of her aphasia. She had emergent arteriogram showing a web in the distal portion of her endarterectomy site. She underwent emergent carotid arteriography and this revealed what appeared to be a flap at the distal endarterectomy and the distal portion of her patch. It was recommended that she undergo exploration of this for a probable symptomatic lesion.  DESCRIPTION OF PROCEDURE:  The patient was taken to the operating room and placed in a position where the area of the left neck was prepped and draped in the usual sterile fashion. The prior incision was opened. She had an enlarged amount of inflammatory response and she is approximately 2 weeks out from her endarterectomy. The common  carotid artery was encircled with an umbilical tape and Rummel tourniquet. The superior thyroid artery was encircled with a 3-0 Potts tie. The external carotid was encircled with a blue vessiloop and the internal carotid was encircled with an umbilical tape and Rummel tourniquet. The patient was given 7000 units of intravenous heparin and after adequate circulation time, the internal, external, and common carotid arteries were occluded. The patch was removed in its entirety. A 10 shunt was passed up the internal carotid, allowed to back bleed and then down the common carotid where it was secured with Rummel tourniquet. The endarterectomy was continued further distally and there was a ridge of intima causing a stenosis in the internal carotid artery. This area was endarterectomized and flushed with heparinized saline. No further stenosis was noted. A Finesse Hemashield Dacron patch was brought onto the field. The endarterectomy was closed with a patch angioplasty closure with a running 6-0 Prolene suture. Prior to completion of the anastomosis, the usual flushing maneuvers were undertaken. The anastomosis had been completed. The external followed by the common and finally internal carotid artery occlusion clamp was removed. Excellent flow characteristics were noted with the hand held Doppler in the internal and external carotid arteries. The patient was given 15 mg of protamine to reverse the heparin. The wound was irrigated with saline and hemostasis obtained with electrocautery. A 10 flat Jackson-Pratt drain was brought through a separate stab incision in the more proximal neck and placed on  the bed of the endarterectomy and secured to the skin with 3-0 nylon stitch. The sternocleidomastoid was reapproximated over the carotid with several interrupted 3-0 Vicryl sutures. The platysma was closed with running 3-0 Vicryl suture and the skin was closed with 4-0 subcuticular Vicryl stitch.  Benzoin and Steri-Strips and a sterile dressing were applied. The patient was taken to the recovery room in stable condition. D:  06/27/00 TD:  06/28/00 Job: [id number]

## 2011-03-23 NOTE — H&P (Signed)
[facility]  Patient:    Kendra Todd, Kendra Todd                            MRN: [id number] Adm. Date:  06/13/00 Attending:  Denman George, M.D. Dictator:   Reed Pandy. Lewis, P.A.C. CC:         Dr. Chilton Si   History and Physical  DATE OF BIRTH:  [birth year].  CHIEF COMPLAINT:  Carotid artery stenosis, symptomatic.  HISTORY OF PRESENT ILLNESS:  This is a 70 year old black female with diabetes, igh blood pressure and coronary artery disease, who was referred to Dr. Madilyn Fireman for evaluation of recently diagnosed carotid artery stenosis.  Kendra Todd was at a beauty shop in June, 2001, when she said she felt pressure in her head and neck, which was followed by a syncopal episode.  Carotid Dopplers were subsequently done by Dr.  Chilton Si, which showed internal carotid artery stenosis.  She was referred to Dr. Madilyn Fireman.  A repeat carotid doppler exam was done, which showed extensive plaque disease bilaterally, and bilateral high grade internal carotid artery stenosis.  Dr. Madilyn Fireman ordered MR angiography and after review, he recommended left carotid endarterectomy.  This has been scheduled for 06/13/00 at [facility].  [name] has had no acute episodes recently.  Her only syncopal episode was the  initial one back in June.  She reports that she has continued having intermittent light-headedness and occasional blurred vision and headache.  This, however, is not new.  PAST MEDICAL HISTORY: 1. Noninsulin dependent diabetes mellitus. 2. Coronary artery disease with MI x 2 in 1984 and 1982 and angioplasties. 3. Hypertension. 4. Back pain, with history of surgeries.  PAST SURGICAL HISTORY:  Angioplasty in 1984 and 1982.  History of kidney stone removal.  Gallbladder surgery.  Three back surgeries.  FAMILY HISTORY:  Mother deceased at 85, secondary to CVA, and MI.  Father deceased at 98, with MI.  Sisters: ages 78, 42 and 44 - with CHF and cancer.   Four brothers with heart disease and CVAs.  There is a family history of diabetes, breast cancer, coronary artery disease and CVAs.  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 She spends much time caring for her disabled husband, who has multiple sclerosis. he denies alcohol use.  She has smoked 1/2-pack/day for 15 years and is trying to quit.  Three children, alive and well.   MEDICATIONS:  Baycol 0.8 mg p.o. q.d., Glucotrol 5 mg two tablets p.o. q.d., Altace 5 mg q.d., enteric-coated aspirin 325 mg p.o. q.d.  ALLERGIES:  No known allergies.  REVIEW OF SYSTEMS:  Light-headedness, headaches and blurred vision intermittently, as per HPI.  She denies liver disease.  She has a history of kidney stones. She denies asthma, COPD or a history of stroke.  She denies bleeding with urine or bowels.  PHYSICAL EXAMINATION:  VITAL SIGNS:  Blood pressure 150/80, pulse 88, respirations 20.  HEENT:  Normocephalic, atraumatic.  Pupils equal, round and reactive to light and accommodation; extraocular movements intact.  Visual acuity grossly intact, OU.  Oropharynx grossly normal.  NECK:  Supple; no JVD.  There are positive bilateral carotid bruits; no lymph- adenopathy or thyromegaly.  CHEST:  Symmetrical, with no wheezes, no rhonchi, no crackles.  There is no axillary or supraclavicular lymphadenopathy.  Breath sounds are nonlabored.  CARDIOVASCULAR:  Regular rate and rhythm; S1, S2, with no  murmur.  ABDOMEN;  Soft, nontender/nondistended, with positive bowel sounds; no palpable  masses, no abdominal bruits.  EXTREMITIES:  Warm; no clubbing, cyanosis or edema.  No ulcerations.  There is  normal hair pattern.  Carotid pulses 2+ bilaterally with bruits bilaterally. Femoral pulses are 2+ bilaterally, with no bruits.  She has 2+ dorsalis pedis pulses.  NEUROLOGIC:  Cranial nerves II-XII are grossly normal.  She is alert and oriented x 3.  She has a  normal affect.  She had a normal gait.  There are no focal deficits on exam.  She is neurologically intact.  Deep tendon reflexes are +2/4. Muscular strength is +5/5, with full range of motion throughout.  She has normal sensation in her lower extremities.  ASSESSMENT/PLAN:  This is a 70 year old black female with a relatively recent syncopal event, who was found to have extensive internal carotid artery stenosis bilaterally.  She was referred to Dr. Madilyn Fireman, who recommends left carotid endarterectomy.  This is to be done on 06/13/00.  Dr. Madilyn Fireman has seen/evaluated s. Popescu, and explained the risks, benefits, details and alternatives of surgery, and she has agreed to continue.  [name] is currently clinically stable. DD:  06/12/00 TD:  06/12/00 Job: [id number]

## 2011-03-30 ENCOUNTER — Inpatient Hospital Stay (HOSPITAL_COMMUNITY)
Admission: RE | Admit: 2011-03-30 | Discharge: 2011-04-08 | DRG: 068 | Disposition: A | Discharge status: Home / Self Care | Payer: Medicare Other | Source: Ambulatory Visit | Attending: Vascular Surgery | Admitting: Vascular Surgery

## 2011-03-30 DIAGNOSIS — I6529 Occlusion and stenosis of unspecified carotid artery: Secondary | ICD-10-CM

## 2011-03-30 HISTORY — PX: ARCH AORTOGRAM: SHX5726

## 2011-03-30 LAB — POCT I-STAT, CHEM 8
Chloride: 97 mEq/L (ref 96–112)
Glucose, Bld: 155 mg/dL — ABNORMAL HIGH (ref 70–99)
HCT: 34 % — ABNORMAL LOW (ref 36.0–46.0)
Potassium: 3.3 mEq/L — ABNORMAL LOW (ref 3.5–5.1)
Sodium: 136 mEq/L (ref 135–145)

## 2011-03-30 LAB — GLUCOSE, CAPILLARY: Glucose-Capillary: 146 mg/dL — ABNORMAL HIGH (ref 70–99)

## 2011-04-03 NOTE — Consult Note (Signed)
  NAMEKENNEDEE, Kendra Todd                   ACCOUNT NO.:  [id number]  MEDICAL RECORD NO.:  [id number]           PATIENT TYPE:  I  LOCATION:  2899                         FACILITY:  [facility]  PHYSICIAN:  Keaun Schnabel K. Sharonlee Nine, M.D.DATE OF BIRTH:  Sep 24, 1941  DATE OF CONSULTATION:  03/30/2011 DATE OF DISCHARGE:                                CONSULTATION   CLINICAL HISTORY:  Recurrent right carotid artery  stenosis.  PROCEDURES:  Bilateral common carotid arteriograms with  intracranial interpretation.  FINDINGS:    The right common carotid arteriogram demonstrates the  right internal carotid artery at the cervical petrous junction  to be widely patent.  The petrous, cavernous, and supraclinoid segments are normally opacified.   The right middle and the right anterior cerebral arteries opacify  normally into the capillary and the venous phases.  The left common carotid artery arteriogram  demonstrates the left internal carotid artery at the cervical petroius junction  to be normal.  There is mild  atherosclerotic narrowing  involving the distal petrous/cavernous junction region.  Distal to this, the cavernous carotid and the supraclinoid segments are normal.  The left middle cerebral artery appears duplicated on a developmental basis.  The left anterior cerebral artery opacifies normally into the capillary and the venous phases.  Cross specification via the anterior communicating artery of the right anterior cerebral artery distal to the A2 segment is seen.  The left middle cerebral artery opacifies normally  into the capillary and venous phases.  IMPRESSION: 1. Mild  narrowing of the petrous cavernous junction of the left     internal carotid artery due to a circumferential  atherosclerotic plaque. 2 Duplicated left middle cerebral artery .[name] 4. Incidental note made of a small infundibulum at the origin of the     left posterior communicating artery.     ______________________________ [name] Corliss Skains, M.D.    SKD/MEDQ  D:  03/30/2011  T:  03/31/2011  Job:  [id number]  Electronically Signed by Julieanne Cotton M.D. on 04/03/2011 10:30:31 AM

## 2011-04-08 ENCOUNTER — Emergency Department (HOSPITAL_COMMUNITY)
Admission: EM | Admit: 2011-04-08 | Discharge: 2011-04-08 | Disposition: A | Discharge status: Home / Self Care | Payer: Medicare Other | Attending: Emergency Medicine | Admitting: Emergency Medicine

## 2011-04-08 DIAGNOSIS — L2989 Other pruritus: Secondary | ICD-10-CM | POA: Insufficient documentation

## 2011-04-08 DIAGNOSIS — I10 Essential (primary) hypertension: Secondary | ICD-10-CM | POA: Insufficient documentation

## 2011-04-08 DIAGNOSIS — Z7982 Long term (current) use of aspirin: Secondary | ICD-10-CM | POA: Insufficient documentation

## 2011-04-08 DIAGNOSIS — T7840XA Allergy, unspecified, initial encounter: Secondary | ICD-10-CM | POA: Insufficient documentation

## 2011-04-08 DIAGNOSIS — E119 Type 2 diabetes mellitus without complications: Secondary | ICD-10-CM | POA: Insufficient documentation

## 2011-04-08 DIAGNOSIS — Z7901 Long term (current) use of anticoagulants: Secondary | ICD-10-CM | POA: Insufficient documentation

## 2011-04-08 DIAGNOSIS — E7800 Pure hypercholesterolemia, unspecified: Secondary | ICD-10-CM | POA: Insufficient documentation

## 2011-04-08 DIAGNOSIS — Z79899 Other long term (current) drug therapy: Secondary | ICD-10-CM | POA: Insufficient documentation

## 2011-04-08 DIAGNOSIS — L509 Urticaria, unspecified: Secondary | ICD-10-CM | POA: Insufficient documentation

## 2011-04-08 DIAGNOSIS — L298 Other pruritus: Secondary | ICD-10-CM | POA: Insufficient documentation

## 2011-04-13 NOTE — Op Note (Signed)
Kendra Todd, Kendra Todd                   ACCOUNT NO.:  [id number]  MEDICAL RECORD NO.:  [id number]           PATIENT TYPE:  I  LOCATION:  2899                         FACILITY:  [facility]  PHYSICIAN:  Janetta Hora. Nelma Phagan, MD  DATE OF BIRTH:  11/27/1940  DATE OF PROCEDURE:  03/30/2011 DATE OF DISCHARGE:                              OPERATIVE REPORT   PROCEDURES:  Arch aortogram, bilateral selective carotid angiogram.  PREOPERATIVE DIAGNOSIS:  Recurrent stenosis, right internal carotid artery.  POSTOPERATIVE DIAGNOSIS:  Recurrent stenosis, right internal carotid artery.  ANESTHESIA:  Local.  CO-SURGEONS: 1. Janetta Hora. Savreen Gebhardt, MD 2. Nanetta Batty, MD  INDICATIONS:  The patient is status post previous right carotid endarterectomy.  She had a recent duplex ultrasound which suggested high- grade right carotid stenosis with velocities greater than 400 cm for second.  She subsequently had a CT angiogram which confirmed a lesion in the right carotid system.  She presents for arch aortogram for further clarification of the stenosis in the right carotid system.  She has been asymptomatic.  OPERATIVE DETAILS:  After obtaining informed consent, the patient was taken to the PV Lab.  The patient was placed in supine position on the angio table.  Both groins were prepped and draped in the usual sterile fashion.  Local anesthesia was infiltrated over the right common femoral artery.  Introducer needle was used to cannulate the right common femoral artery and a 0.03 Versacore wire threaded into the abdominal aorta under fluoroscopic guidance.  Next, a 5-French sheath was placed over the guidewire into the right common femoral artery and this was thoroughly flushed with heparinized saline.  A 5-French pigtail catheter was advanced over the guidewire and these were advanced as a unit up into the ascending aorta and arch aortogram was obtained.  This showed a bovine arch configuration.  The origin of  the left common carotid artery was patent.  The right subclavian and right common carotid origins were patent.  The left subclavian origin was patent.  There was antegrade flow through the left vertebral artery.  The origin was not visualized, but did not appear to have any significant stenosis.  The right vertebral artery was patent with antegrade flow.  Next, the 5-French pigtail catheter was pulled back over a guidewire and exchanged for a JB1 catheter.  This was advanced up into the ascending aorta and the innominate artery first was selectively catheterized followed by the right common carotid artery.  The right common carotid artery injections were then performed.  There was a 40% stenosis of the common carotid artery at what appeared to be the proximal aspect of the previous patch angioplasty.  There was a weblike shelf in the midsection of the patched area of the artery, however, this did not appear to have flow-limiting stenosis.  Multiple views were taken in the oblique projection as well as the AP and lateral projection and again a high- grade stenosis could not be identified.  Intracranial views were also taken in AP and lateral projection to be interpreted by the radiologist.  At this point, the JB1 catheter was pulled  down over a guidewire back into the aortic arch.  Catheter was then advanced back over a guidewire to selectively catheterize the left common carotid artery and left common carotid injections were then performed.  In the left common carotid artery, there was some irregularity in the lateral projection, but again no flow-limiting stenosis.  This was confirmed in AP projection.  Intracranial views were also performed in the AP and lateral projection from the left side and again to be interpreted by the radiologist.  At this point, the catheter was pulled back over a guidewire, and the guidewire and catheter were removed from the sheath.  The sheath was then  thoroughly flushed with heparinized saline.  The patient was taken to the holding area in stable condition.  There were no complications.  OPERATIVE FINDINGS: 1. 40% recurrent stenosis at the origin of the right proximal carotid     patch.  This is in the mid common carotid artery. 2. Shelf-like recurrent stenosis within the midsection of the patch     segment that does not appear to be flow limiting. 3. Bovine arch configuration with normal non-stenosed great vessel     origins.  The patient will be managed medically for now.  She is currently on Coumadin for atrial fibrillation and is also on aspirin.  We will repeat her carotid duplex scan in 3 months' time.     Janetta Hora. [name], MD     CEF/MEDQ  D:  03/30/2011  T:  03/30/2011  Job:  [id number]  Electronically Signed by Fabienne Bruns MD on 04/13/2011 09:26:59 AM

## 2011-05-02 DIAGNOSIS — M79609 Pain in unspecified limb: Secondary | ICD-10-CM

## 2011-05-02 HISTORY — DX: Pain in unspecified limb: M79.609

## 2011-06-04 DIAGNOSIS — R0989 Other specified symptoms and signs involving the circulatory and respiratory systems: Secondary | ICD-10-CM

## 2011-06-04 HISTORY — DX: Other specified symptoms and signs involving the circulatory and respiratory systems: R09.89

## 2011-06-19 ENCOUNTER — Encounter: Payer: Self-pay | Admitting: Vascular Surgery

## 2011-07-04 ENCOUNTER — Encounter: Payer: Self-pay | Admitting: Vascular Surgery

## 2011-07-05 ENCOUNTER — Ambulatory Visit (INDEPENDENT_AMBULATORY_CARE_PROVIDER_SITE_OTHER): Payer: Medicare Other | Admitting: Vascular Surgery

## 2011-07-05 ENCOUNTER — Encounter: Payer: Self-pay | Admitting: Vascular Surgery

## 2011-07-05 VITALS — BP 188/80 | HR 71 | Resp 16 | Ht 63.0 in | Wt 160.0 lb

## 2011-07-05 DIAGNOSIS — I6529 Occlusion and stenosis of unspecified carotid artery: Secondary | ICD-10-CM

## 2011-07-05 DIAGNOSIS — Z48812 Encounter for surgical aftercare following surgery on the circulatory system: Secondary | ICD-10-CM

## 2011-07-05 NOTE — Progress Notes (Signed)
Patient is a 70 year old female that we have been following for a moderate carotid stenosis. She was last seen in May of 2012. At that time carotid duplex exam suggested progression of stenosis on the right side a greater than 80%. She subsequently underwent arch aortogram and selective carotid angiogram on 03/30/2011. However at that time the antegrade did not confirm the duplex findings. She had a 40% recurrent stenosis at the origin of the right proximal carotid with a mid shelflike recurrent stenosis in the patch. However it was not thought to be flow-limiting. The patient is on Coumadin. She denies any symptoms of TIA amaurosis or stroke. She takes Coumadin for atrial fibrillation.  Chronic medical problems remained hypertension elevated cholesterol borderline diabetes and coronary artery disease. These are all currently stable and followed by Dr. Chilton Si  Review of systems: Patient denies shortness of breath or chest pain  Physical exam:  Vital signs: Heart rate 80, respirations 16, blood pressure 180/90  HEENT: Negative  Neck: Bilateral carotid bruits 2+ carotid pulses  Chest: Clear to auscultation bilaterally  Cardiac: Regular rate and rhythm without murmur  Abdomen: Soft nontender nondistended no mass  Extremities: One plus radial and 2+ femoral pulses bilaterally  Neuro: Symmetric upper extremity and lower extremity motor strength which is 5 over 5  Noninvasive vascular exam: Patient had a carotid duplex exam today, this continued to show elevated velocities as on her previous duplex exam with no significant change  Assessment: Recurrent right carotid stenosis, asymptomatic  Plan: Since the patient is currently asymptomatic and she had a carotid angiogram only 3 months ago which did not confirm the duplex findings, I believe the best option is conservative management with continued medical therapy, risk factor modification. We'll repeat her carotid duplex exam in 6 months time. If  she becomes symptomatic before then or velocities continued to increase we'll consider repeat arteriogram

## 2011-07-26 NOTE — Procedures (Unsigned)
CAROTID DUPLEX EXAM  INDICATION:  Followup carotid stenosis.  HISTORY: Diabetes:  Yes. Cardiac:  PTCA, history of MI. Hypertension:  Yes. Smoking:  Currently. Previous Surgery:  Bilateral carotid endarterectomies by Dr. Madilyn Fireman in 2001; left carotid endarterectomy redo with Dr. Arbie Cookey on 06/27/2000. CV History:  History of CVA. Amaurosis Fugax No, Paresthesias Yes, Hemiparesis No                                      RIGHT             LEFT Brachial systolic pressure:         148               148 Brachial Doppler waveforms:         WNL               WNL Vertebral direction of flow:        Antegrade         Antegrade DUPLEX VELOCITIES (cm/sec) CCA peak systolic                   64                70 ECA peak systolic                   145               95 ICA peak systolic                   457 - B/148 - P   79 - P/122 - M ICA end diastolic                   122 - B/22 - P    20 - P/31 - M PLAQUE MORPHOLOGY:                  Calcified         Heterogeneous PLAQUE AMOUNT:                      Severe            Mild PLAQUE LOCATION:                    CCA D/ICA - P     CCA/ICA  IMPRESSION: 1. Right distal common carotid artery/proximal internal carotid artery     stenosis present in the 80%-99% range with history of     endarterectomy. 2. Left internal carotid artery stenosis in the 1%-39% range with     history of endarterectomy. 3. Bilateral external carotid arteries appear patent. 4. Bilateral vertebral arteries are patent and antegrade. 5. Increase in disease on the right when compared to angiogram from     03/30/2011.  ___________________________________________ Janetta Hora. Fields, MD  SH/MEDQ  D:  07/06/2011  T:  07/06/2011  Job:  [id number]

## 2011-11-14 ENCOUNTER — Other Ambulatory Visit: Payer: Self-pay | Admitting: Internal Medicine

## 2011-11-14 DIAGNOSIS — E1159 Type 2 diabetes mellitus with other circulatory complications: Secondary | ICD-10-CM | POA: Diagnosis not present

## 2011-11-14 DIAGNOSIS — Z23 Encounter for immunization: Secondary | ICD-10-CM | POA: Diagnosis not present

## 2011-11-14 DIAGNOSIS — E785 Hyperlipidemia, unspecified: Secondary | ICD-10-CM | POA: Diagnosis not present

## 2011-11-20 ENCOUNTER — Other Ambulatory Visit: Payer: Medicare Other

## 2011-11-22 ENCOUNTER — Ambulatory Visit
Admission: RE | Admit: 2011-11-22 | Discharge: 2011-11-22 | Disposition: A | Discharge status: Home / Self Care | Payer: Medicare Other | Source: Ambulatory Visit | Attending: Internal Medicine | Admitting: Internal Medicine

## 2011-11-22 DIAGNOSIS — M542 Cervicalgia: Secondary | ICD-10-CM | POA: Diagnosis not present

## 2011-11-22 DIAGNOSIS — R1310 Dysphagia, unspecified: Secondary | ICD-10-CM | POA: Diagnosis not present

## 2011-11-29 DIAGNOSIS — I4891 Unspecified atrial fibrillation: Secondary | ICD-10-CM | POA: Diagnosis not present

## 2011-11-29 DIAGNOSIS — Z7901 Long term (current) use of anticoagulants: Secondary | ICD-10-CM | POA: Diagnosis not present

## 2011-12-19 DIAGNOSIS — Z7901 Long term (current) use of anticoagulants: Secondary | ICD-10-CM | POA: Diagnosis not present

## 2011-12-19 DIAGNOSIS — I4891 Unspecified atrial fibrillation: Secondary | ICD-10-CM | POA: Diagnosis not present

## 2011-12-26 DIAGNOSIS — M79609 Pain in unspecified limb: Secondary | ICD-10-CM | POA: Diagnosis not present

## 2011-12-26 DIAGNOSIS — R252 Cramp and spasm: Secondary | ICD-10-CM | POA: Diagnosis not present

## 2011-12-26 DIAGNOSIS — S92919A Unspecified fracture of unspecified toe(s), initial encounter for closed fracture: Secondary | ICD-10-CM | POA: Diagnosis not present

## 2011-12-26 DIAGNOSIS — M1990 Unspecified osteoarthritis, unspecified site: Secondary | ICD-10-CM | POA: Diagnosis not present

## 2012-01-03 ENCOUNTER — Other Ambulatory Visit: Payer: Medicare Other

## 2012-01-07 ENCOUNTER — Encounter: Payer: Self-pay | Admitting: Vascular Surgery

## 2012-01-08 ENCOUNTER — Other Ambulatory Visit (INDEPENDENT_AMBULATORY_CARE_PROVIDER_SITE_OTHER): Payer: Medicare Other | Admitting: *Deleted

## 2012-01-08 DIAGNOSIS — Z9889 Other specified postprocedural states: Secondary | ICD-10-CM

## 2012-01-08 DIAGNOSIS — Z48812 Encounter for surgical aftercare following surgery on the circulatory system: Secondary | ICD-10-CM | POA: Diagnosis not present

## 2012-01-08 DIAGNOSIS — I6529 Occlusion and stenosis of unspecified carotid artery: Secondary | ICD-10-CM

## 2012-01-08 HISTORY — DX: Other specified postprocedural states: Z98.890

## 2012-01-11 NOTE — Procedures (Unsigned)
CAROTID DUPLEX EXAM  INDICATION:  Followup carotid artery stenosis  HISTORY: Diabetes:  Yes Cardiac:  PTCA, history of MI Hypertension:  Yes Smoking:  Yes Previous Surgery:  Bilateral carotid endarterectomies by Dr. Madilyn Fireman in 2001; left carotid endarterectomy redo with Dr. Arbie Cookey 06/27/2000 CV History:  A history of CVA Amaurosis Fugax No, Paresthesias No, Hemiparesis No                                      RIGHT             LEFT Brachial systolic pressure:         150               160 Brachial Doppler waveforms:         Biphasic          Biphasic Vertebral direction of flow:        Antegrade         Antegrade DUPLEX VELOCITIES (cm/sec) CCA peak systolic                   68                78 ECA peak systolic                   146               175 ICA peak systolic                   470               79 (mid) ICA end diastolic                   154               23 PLAQUE MORPHOLOGY:                  Mixed, irregular  Mixed, irregular PLAQUE AMOUNT:                      Severe            Mild to moderate PLAQUE LOCATION:                    Bifurcation, ICA, ECA and distal CCA CCA/ICA  IMPRESSION: 1. Right distal common carotid artery/proximal internal carotid artery     stenosis in the 80%-99% range with history of carotid     endarterectomy. 2. Left  0%-39% internal carotid artery stenosis with history of     carotid endarterectomy.  ___________________________________________ Janetta Hora Fields, MD  SS/MEDQ  D:  01/08/2012  T:  01/08/2012  Job:  [id number]

## 2012-01-14 ENCOUNTER — Other Ambulatory Visit: Payer: Self-pay | Admitting: Internal Medicine

## 2012-01-14 DIAGNOSIS — Z1231 Encounter for screening mammogram for malignant neoplasm of breast: Secondary | ICD-10-CM

## 2012-01-16 DIAGNOSIS — Z7901 Long term (current) use of anticoagulants: Secondary | ICD-10-CM | POA: Diagnosis not present

## 2012-01-16 DIAGNOSIS — I4891 Unspecified atrial fibrillation: Secondary | ICD-10-CM | POA: Diagnosis not present

## 2012-01-30 DIAGNOSIS — Z7901 Long term (current) use of anticoagulants: Secondary | ICD-10-CM | POA: Diagnosis not present

## 2012-01-30 DIAGNOSIS — I4891 Unspecified atrial fibrillation: Secondary | ICD-10-CM | POA: Diagnosis not present

## 2012-02-08 ENCOUNTER — Ambulatory Visit
Admission: RE | Admit: 2012-02-08 | Discharge: 2012-02-08 | Disposition: A | Discharge status: Home / Self Care | Payer: Medicare Other | Source: Ambulatory Visit | Attending: Internal Medicine | Admitting: Internal Medicine

## 2012-02-08 DIAGNOSIS — Z1231 Encounter for screening mammogram for malignant neoplasm of breast: Secondary | ICD-10-CM | POA: Diagnosis not present

## 2012-02-11 DIAGNOSIS — E785 Hyperlipidemia, unspecified: Secondary | ICD-10-CM | POA: Diagnosis not present

## 2012-02-11 DIAGNOSIS — E1159 Type 2 diabetes mellitus with other circulatory complications: Secondary | ICD-10-CM | POA: Diagnosis not present

## 2012-02-12 ENCOUNTER — Encounter: Payer: Self-pay | Admitting: Vascular Surgery

## 2012-02-13 DIAGNOSIS — E1159 Type 2 diabetes mellitus with other circulatory complications: Secondary | ICD-10-CM | POA: Diagnosis not present

## 2012-02-13 DIAGNOSIS — E785 Hyperlipidemia, unspecified: Secondary | ICD-10-CM | POA: Diagnosis not present

## 2012-02-13 DIAGNOSIS — I10 Essential (primary) hypertension: Secondary | ICD-10-CM | POA: Diagnosis not present

## 2012-02-13 DIAGNOSIS — I6529 Occlusion and stenosis of unspecified carotid artery: Secondary | ICD-10-CM | POA: Diagnosis not present

## 2012-02-14 ENCOUNTER — Ambulatory Visit (INDEPENDENT_AMBULATORY_CARE_PROVIDER_SITE_OTHER): Payer: Medicare Other | Admitting: Vascular Surgery

## 2012-02-14 ENCOUNTER — Encounter: Payer: Self-pay | Admitting: Vascular Surgery

## 2012-02-14 VITALS — BP 130/60 | HR 66 | Temp 98.1°F | Resp 16 | Ht 63.0 in | Wt 158.4 lb

## 2012-02-14 DIAGNOSIS — I6523 Occlusion and stenosis of bilateral carotid arteries: Secondary | ICD-10-CM | POA: Insufficient documentation

## 2012-02-14 DIAGNOSIS — I6529 Occlusion and stenosis of unspecified carotid artery: Secondary | ICD-10-CM | POA: Diagnosis not present

## 2012-02-14 NOTE — Progress Notes (Signed)
[facility] [place] HISTORY AND PHYSICAL   History of Present Illness:  Patient is a 71 y.o. year old female who presents for follow-up evaluation for carotid stenosis. She is on coumadin for afib.  His atherosclerotic risk factors remain afib, diabetes, elevated cholesterol, hypertension, smoking, and coronary artery disease.  These are all currently stable and followed by his primary care physician.  She denies any new neurologic events including amaurosis, numbness, or weakness. Previously had a right carotid endarterectomy by Dr. Madilyn Fireman in 2001. She also had a previous left carotid endarterectomy and redo carotid endarterectomy in 2001. Initial carotid was done by Dr. Madilyn Fireman the redo with Dr. Arbie Cookey.  She had an arteriogram last year to evaluate possible increased velocities. This showed a 40% left carotid stenosis and a shelflike plaque in the right side that was not flow-limiting. She returns for followup today after a recent carotid duplex scan showed increasing velocities on the right side.  Past Medical History  Diagnosis Date  . Arthritis   . Atrial fibrillation 02/21/2011  . Hypertension   . Hyperlipidemia   . Myocardial infarction   . Diabetes mellitus age 2  . Stroke   . CAD (coronary artery disease)     prior coronary stenting    Past Surgical History  Procedure Date  . Carotid endarterectomy 2001    Bilateral  . Cholecystectomy   . Kidney stone surgery     Removal  . Back surgery     three previous surgeries  . Angioplasty 1984 and 1982    Review of Systems:  Neurologic: as above Cardiac:denies shortness of breath or chest pain Pulmonary: denies cough or wheeze  Social History History  Substance Use Topics  . Smoking status: Current Everyday Smoker -- 0.3 packs/day for 57 years    Types: Cigarettes  . Smokeless tobacco: Never Used   Comment: pt states she is working on it but husband just passed  . Alcohol Use: No     Allergies  Allergies  Allergen Reactions  . Iron Hives  . Vicodin (Hydrocodone-Acetaminophen)   . Vioxx (Rofecoxib)      Current Outpatient Prescriptions  Medication Sig Dispense Refill  . aspirin 81 MG tablet Take 81 mg by mouth daily.        [name] glyBURIDE (DIABETA) 5 MG tablet Take 5 mg by mouth daily with breakfast.        . metFORMIN (GLUMETZA) 1000 MG (MOD) 24 hr tablet Take 1,000 mg by mouth 2 (two) times daily with a meal.        . metoprolol (TOPROL-XL) 50 MG 24 hr tablet Take 50 mg by mouth 2 (two) times daily.        [name] NITROGLYCERIN PO Take 0.4 mg by mouth as needed.        . rosuvastatin (CRESTOR) 20 MG tablet Take 20 mg by mouth at bedtime.        . valsartan (DIOVAN) 160 MG tablet Take 160 mg by mouth daily.        [name] warfarin (COUMADIN) 3 MG tablet Take 3 mg by mouth daily.        [name] amoxicillin (AMOXIL) 500 MG tablet Take 500 mg by mouth 2 (two) times daily.          Physical Examination  Filed Vitals:   02/14/12 1528  BP: 130/60  Pulse: 66  Temp: 98.1 F (36.7 C)  TempSrc: Oral  Resp: 16  Height: 5\' 3"  (1.6 m)  Weight: 158 lb 6.4 oz (71.85 kg)    Body mass index is 28.06 kg/(m^2).  General:  Alert and oriented, no acute distress HEENT: Normal Neck: Right side bruit or JVD Pulmonary: Clear to auscultation bilaterally Cardiac: Regular Rate and Rhythm with 2/6 systolic murmur Neurologic: Upper and lower extremity motor 5/5 and symmetric  DATA: I reviewed her recent carotid duplex exam. This showed increased velocities on the right side with a peak systolic of 470 cm/s and end-diastolic of 154 cm/s. There is no significant left-sided stenosis.   ASSESSMENT:   Possible recurrent right internal carotid artery stenosis, asymptomatic   PLAN:  Since the patient is on Coumadin I believe the best option initially would be to obtain a CT angiogram of the neck to see if this confirms the duplex findings. If this is confirmed she will probably need a carotid  angiogram as her next step.  She will followup with me after her CT angiogram  Fabienne Bruns, MD [facility] Office: [phone] Pager: [phone]

## 2012-02-15 NOTE — Progress Notes (Signed)
Addended by: Sharee Pimple on: 02/15/2012 11:08 AM   Modules accepted: Orders

## 2012-02-19 DIAGNOSIS — Z9861 Coronary angioplasty status: Secondary | ICD-10-CM | POA: Diagnosis not present

## 2012-02-19 DIAGNOSIS — E782 Mixed hyperlipidemia: Secondary | ICD-10-CM | POA: Diagnosis not present

## 2012-02-19 DIAGNOSIS — I2510 Atherosclerotic heart disease of native coronary artery without angina pectoris: Secondary | ICD-10-CM | POA: Diagnosis not present

## 2012-02-19 DIAGNOSIS — I4891 Unspecified atrial fibrillation: Secondary | ICD-10-CM | POA: Diagnosis not present

## 2012-02-27 DIAGNOSIS — Z7901 Long term (current) use of anticoagulants: Secondary | ICD-10-CM | POA: Diagnosis not present

## 2012-02-27 DIAGNOSIS — M542 Cervicalgia: Secondary | ICD-10-CM | POA: Diagnosis not present

## 2012-02-27 DIAGNOSIS — E1159 Type 2 diabetes mellitus with other circulatory complications: Secondary | ICD-10-CM | POA: Diagnosis not present

## 2012-02-27 DIAGNOSIS — R5381 Other malaise: Secondary | ICD-10-CM | POA: Diagnosis not present

## 2012-02-27 DIAGNOSIS — I4891 Unspecified atrial fibrillation: Secondary | ICD-10-CM | POA: Diagnosis not present

## 2012-03-05 ENCOUNTER — Encounter: Payer: Self-pay | Admitting: Vascular Surgery

## 2012-03-06 ENCOUNTER — Ambulatory Visit (INDEPENDENT_AMBULATORY_CARE_PROVIDER_SITE_OTHER): Payer: Medicare Other | Admitting: Vascular Surgery

## 2012-03-06 ENCOUNTER — Ambulatory Visit
Admission: RE | Admit: 2012-03-06 | Discharge: 2012-03-06 | Disposition: A | Discharge status: Home / Self Care | Payer: Medicare Other | Source: Ambulatory Visit | Attending: Vascular Surgery | Admitting: Vascular Surgery

## 2012-03-06 ENCOUNTER — Encounter: Payer: Self-pay | Admitting: Vascular Surgery

## 2012-03-06 VITALS — BP 153/56 | HR 62 | Temp 98.2°F | Ht 63.0 in | Wt 160.3 lb

## 2012-03-06 DIAGNOSIS — I6529 Occlusion and stenosis of unspecified carotid artery: Secondary | ICD-10-CM | POA: Diagnosis not present

## 2012-03-06 DIAGNOSIS — I6509 Occlusion and stenosis of unspecified vertebral artery: Secondary | ICD-10-CM | POA: Diagnosis not present

## 2012-03-06 DIAGNOSIS — Z9889 Other specified postprocedural states: Secondary | ICD-10-CM

## 2012-03-06 DIAGNOSIS — I658 Occlusion and stenosis of other precerebral arteries: Secondary | ICD-10-CM | POA: Diagnosis not present

## 2012-03-06 DIAGNOSIS — L98499 Non-pressure chronic ulcer of skin of other sites with unspecified severity: Secondary | ICD-10-CM | POA: Diagnosis not present

## 2012-03-06 HISTORY — DX: Other specified postprocedural states: Z98.890

## 2012-03-06 MED ORDER — IOHEXOL 350 MG/ML SOLN
100.0000 mL | Freq: Once | INTRAVENOUS | Status: AC | PRN
  Administered 2012-03-06: 100 mL via INTRAVENOUS

## 2012-03-06 NOTE — Progress Notes (Signed)
Patient is a 71 year old female returns for followup today. She was noted recently a carotid duplex scan at increased velocities especially on the right side. She's had bilateral previous carotid endarterectomies. She had a carotid angiogram one year ago to evaluate increased velocities. This showed a shelflike plaque on the right side but not really any significant flow-limiting stenosis. She continues to deny any symptoms of TIA amaurosis or stroke.  She is on chronic Coumadin  Review of systems. She denies any shortness of breath, she denies any chest pain  Physical exam: Filed Vitals:   03/06/12 1359 03/06/12 1400  BP: 150/66 153/56  Pulse: 67 62  Temp: 98.2 F (36.8 C)   TempSrc: Oral   Height: 5\' 3"  (1.6 m)   Weight: 160 lb 4.8 oz (72.712 kg)    neck: Bilateral carotid bruits  Neuro: Upper and lower extremity are 5/5 motor strength  Chest: Clear to auscultation  Cardiac: Regular rate and rhythm  CT angiogram of the neck is reviewed. There is no significant flow-limiting stenosis on the left side. The right side there is a shelflike plaque consistent with her previous arteriogram from year ago. Otherwise there is no significant flow-limiting stenosis.  Assessment: Recurrent stenosis right side in the form of a shelflike plaque which is probably unchanged from her carotid angiogram one year ago. She remains asymptomatic. The best option at this point would be a repeat carotid duplex scan in 6 months time. If she develops any symptoms related TIA amaurosis or stroke. We would consider reevaluation sooner.  Lab: See above  Fabienne Bruns, MD [facility] [place] Office: [phone] Pager: [phone]

## 2012-03-07 NOTE — Progress Notes (Signed)
Addended by: Sharee Pimple on: 03/07/2012 08:37 AM   Modules accepted: Orders

## 2012-03-12 DIAGNOSIS — E119 Type 2 diabetes mellitus without complications: Secondary | ICD-10-CM | POA: Diagnosis not present

## 2012-03-12 DIAGNOSIS — H2510 Age-related nuclear cataract, unspecified eye: Secondary | ICD-10-CM | POA: Diagnosis not present

## 2012-03-26 DIAGNOSIS — M542 Cervicalgia: Secondary | ICD-10-CM | POA: Diagnosis not present

## 2012-03-26 DIAGNOSIS — I4891 Unspecified atrial fibrillation: Secondary | ICD-10-CM | POA: Diagnosis not present

## 2012-03-26 DIAGNOSIS — Z7901 Long term (current) use of anticoagulants: Secondary | ICD-10-CM | POA: Diagnosis not present

## 2012-03-26 DIAGNOSIS — M1990 Unspecified osteoarthritis, unspecified site: Secondary | ICD-10-CM | POA: Diagnosis not present

## 2012-03-26 DIAGNOSIS — E1159 Type 2 diabetes mellitus with other circulatory complications: Secondary | ICD-10-CM | POA: Diagnosis not present

## 2012-04-09 DIAGNOSIS — Z7901 Long term (current) use of anticoagulants: Secondary | ICD-10-CM | POA: Diagnosis not present

## 2012-04-09 DIAGNOSIS — I4891 Unspecified atrial fibrillation: Secondary | ICD-10-CM | POA: Diagnosis not present

## 2012-05-07 DIAGNOSIS — Z7901 Long term (current) use of anticoagulants: Secondary | ICD-10-CM | POA: Diagnosis not present

## 2012-05-07 DIAGNOSIS — I4891 Unspecified atrial fibrillation: Secondary | ICD-10-CM | POA: Diagnosis not present

## 2012-06-03 DIAGNOSIS — I739 Peripheral vascular disease, unspecified: Secondary | ICD-10-CM | POA: Diagnosis not present

## 2012-06-03 DIAGNOSIS — I771 Stricture of artery: Secondary | ICD-10-CM

## 2012-06-03 HISTORY — DX: Stricture of artery: I77.1

## 2012-06-05 DIAGNOSIS — Z7901 Long term (current) use of anticoagulants: Secondary | ICD-10-CM | POA: Diagnosis not present

## 2012-06-05 DIAGNOSIS — I4891 Unspecified atrial fibrillation: Secondary | ICD-10-CM | POA: Diagnosis not present

## 2012-06-11 DIAGNOSIS — E1159 Type 2 diabetes mellitus with other circulatory complications: Secondary | ICD-10-CM | POA: Diagnosis not present

## 2012-06-11 DIAGNOSIS — F17200 Nicotine dependence, unspecified, uncomplicated: Secondary | ICD-10-CM | POA: Diagnosis not present

## 2012-06-11 DIAGNOSIS — I4891 Unspecified atrial fibrillation: Secondary | ICD-10-CM | POA: Diagnosis not present

## 2012-06-11 DIAGNOSIS — Z7901 Long term (current) use of anticoagulants: Secondary | ICD-10-CM | POA: Diagnosis not present

## 2012-07-03 DIAGNOSIS — I4891 Unspecified atrial fibrillation: Secondary | ICD-10-CM | POA: Diagnosis not present

## 2012-07-03 DIAGNOSIS — Z7901 Long term (current) use of anticoagulants: Secondary | ICD-10-CM | POA: Diagnosis not present

## 2012-07-14 DIAGNOSIS — H4011X0 Primary open-angle glaucoma, stage unspecified: Secondary | ICD-10-CM | POA: Diagnosis not present

## 2012-08-07 DIAGNOSIS — I4891 Unspecified atrial fibrillation: Secondary | ICD-10-CM | POA: Diagnosis not present

## 2012-08-07 DIAGNOSIS — Z7901 Long term (current) use of anticoagulants: Secondary | ICD-10-CM | POA: Diagnosis not present

## 2012-09-04 DIAGNOSIS — I4891 Unspecified atrial fibrillation: Secondary | ICD-10-CM | POA: Diagnosis not present

## 2012-09-04 DIAGNOSIS — Z7901 Long term (current) use of anticoagulants: Secondary | ICD-10-CM | POA: Diagnosis not present

## 2012-09-05 DIAGNOSIS — I739 Peripheral vascular disease, unspecified: Secondary | ICD-10-CM | POA: Diagnosis not present

## 2012-09-05 DIAGNOSIS — I2510 Atherosclerotic heart disease of native coronary artery without angina pectoris: Secondary | ICD-10-CM | POA: Diagnosis not present

## 2012-09-05 DIAGNOSIS — I6529 Occlusion and stenosis of unspecified carotid artery: Secondary | ICD-10-CM | POA: Diagnosis not present

## 2012-09-10 ENCOUNTER — Encounter: Payer: Self-pay | Admitting: Neurosurgery

## 2012-09-11 ENCOUNTER — Ambulatory Visit (INDEPENDENT_AMBULATORY_CARE_PROVIDER_SITE_OTHER): Payer: Medicare Other | Admitting: Neurosurgery

## 2012-09-11 ENCOUNTER — Other Ambulatory Visit (INDEPENDENT_AMBULATORY_CARE_PROVIDER_SITE_OTHER): Payer: Medicare Other | Admitting: *Deleted

## 2012-09-11 ENCOUNTER — Encounter: Payer: Self-pay | Admitting: Neurosurgery

## 2012-09-11 VITALS — BP 147/79 | HR 55 | Resp 16 | Ht 62.0 in | Wt 151.0 lb

## 2012-09-11 DIAGNOSIS — I6529 Occlusion and stenosis of unspecified carotid artery: Secondary | ICD-10-CM | POA: Diagnosis not present

## 2012-09-11 DIAGNOSIS — Z48812 Encounter for surgical aftercare following surgery on the circulatory system: Secondary | ICD-10-CM

## 2012-09-11 NOTE — Addendum Note (Signed)
Addended by: Sharee Pimple on: 09/11/2012 02:53 PM   Modules accepted: Orders

## 2012-09-11 NOTE — Progress Notes (Signed)
[facility] [place] Carotid Office Note  CC: Carotid surveillance Referring Physician: Fields  History of Present Illness: 71 year old female patient of Dr. Darrick Penna who has a history of bilateral CEAs with Dr. Madilyn Fireman in the past. She had a redo  on the left and 2001. The patient denies any signs or symptoms of CVA, TIA, amaurosis fugax or any neural deficit. The patient denies any new medical diagnoses or recent surgery.  Past Medical History  Diagnosis Date  . Arthritis   . [place]ath-induced atrial fibrillation 02/21/2011  . Hypertension   . Hyperlipidemia   . Myocardial infarction   . Diabetes mellitus age 81  . Stroke   . CAD (coronary artery disease)     prior coronary stenting    ROS: [x]  Positive   [ ]  Denies    General: [ ]  Weight loss, [ ]  Fever, [ ]  chills Neurologic: [ ]  Dizziness, [ ]  Blackouts, [ ]  Seizure [ ]  Stroke, [ ]  "Mini stroke", [ ]  Slurred speech, [ ]  Temporary blindness; [ ]  weakness in arms or legs, [ ]  Hoarseness Cardiac: [ ]  Chest pain/pressure, [ ]  Shortness of breath at rest [ ]  Shortness of breath with exertion, [ ]  Atrial fibrillation or irregular heartbeat Vascular: [ ]  Pain in legs with walking, [ ]  Pain in legs at rest, [ ]  Pain in legs at night,  [ ]  Non-healing ulcer, [ ]  Blood clot in vein/DVT,   Pulmonary: [ ]  Home oxygen, [ ]  Productive cough, [ ]  Coughing up blood, [ ]  Asthma,  [ ]  Wheezing Musculoskeletal:  [ ]  Arthritis, [ ]  Low back pain, [ ]  Joint pain Hematologic: [ ]  Easy Bruising, [ ]  Anemia; [ ]  Hepatitis Gastrointestinal: [ ]  Blood in stool, [ ]  Gastroesophageal Reflux/heartburn, [ ]  Trouble swallowing Urinary: [ ]  chronic Kidney disease, [ ]  on HD - [ ]  MWF or [ ]  TTHS, [ ]  Burning with urination, [ ]  Difficulty urinating Skin: [ ]  Rashes, [ ]  Wounds Psychological: [ ]  Anxiety, [ ]  Depression   Social History History  Substance Use Topics  . Smoking status: Current Every Day Smoker -- 0.3 packs/day for 57 years     Types: Cigarettes  . Smokeless tobacco: Never Used     Comment: pt states she is working on it but husband just passed  . Alcohol Use: No    Family History Family History  Problem Relation Age of Onset  . Other Mother     CVA  . Heart disease Father   . Heart disease Sister   . Cancer Sister   . Heart disease Brother   . Other Brother     CVA  . Heart disease Brother   . Heart disease Brother   . Heart disease Brother   . Heart disease Sister   . Cancer Sister   . Heart disease Sister   . Cancer Sister   . Diabetes Other   . Cancer Other     breast  . Heart disease Other     cad  . Other Other     cva    Allergies  Allergen Reactions  . Iron Hives  . Vicodin (Hydrocodone-Acetaminophen)   . Vioxx (Rofecoxib)     Current Outpatient Prescriptions  Medication Sig Dispense Refill  . amoxicillin (AMOXIL) 500 MG tablet Take 500 mg by mouth 2 (two) times daily.        [name] aspirin 81 MG tablet Take 81 mg by mouth daily.        [name]  glyBURIDE (DIABETA) 5 MG tablet Take 5 mg by mouth daily with breakfast.        . metFORMIN (GLUMETZA) 1000 MG (MOD) 24 hr tablet Take 1,000 mg by mouth 2 (two) times daily with a meal.        . metoprolol (TOPROL-XL) 50 MG 24 hr tablet Take 50 mg by mouth 2 (two) times daily.        [name] NITROGLYCERIN PO Take 0.4 mg by mouth as needed.        . rosuvastatin (CRESTOR) 20 MG tablet Take 20 mg by mouth at bedtime.        . valsartan (DIOVAN) 160 MG tablet Take 160 mg by mouth daily.        [name] warfarin (COUMADIN) 3 MG tablet Take 3 mg by mouth daily.          Physical Examination  There were no vitals filed for this visit.  There is no height or weight on file to calculate BMI.  General:  WDWN in NAD Gait: Normal HEENT: WNL Eyes: Pupils equal Pulmonary: normal non-labored breathing , without Rales, rhonchi,  wheezing Cardiac: RRR, without  Murmurs, rubs or gallops; Abdomen: soft, NT, no masses Skin: no rashes, ulcers noted  Vascular  Exam Pulses: 3+ radial pulses bilaterally Carotid bruits: Mild bruit heard on the right with a carotid pulse on the left Extremities without ischemic changes, no Gangrene , no cellulitis; no open wounds;  Musculoskeletal: no muscle wasting or atrophy   Neurologic: A&O X 3; Appropriate Affect ; SENSATION: normal; MOTOR FUNCTION:  moving all extremities equally. Speech is fluent/normal  Non-Invasive Vascular Imaging CAROTID DUPLEX 09/11/2012  Right ICA 60 - 79 % stenosis Left ICA 40 - 59 % stenosis   ASSESSMENT/PLAN: Asymptomatic mild/moderate to severe right ICA stenosis, the patient will followup in 6 months with repeat carotid duplex. The patient's questions were encouraged and answered, she is in agreement with this plan.  Lauree Chandler ANP Clinic MD: Darrick Penna

## 2012-09-17 DIAGNOSIS — H4011X0 Primary open-angle glaucoma, stage unspecified: Secondary | ICD-10-CM | POA: Diagnosis not present

## 2012-09-17 DIAGNOSIS — H2510 Age-related nuclear cataract, unspecified eye: Secondary | ICD-10-CM | POA: Diagnosis not present

## 2012-09-24 DIAGNOSIS — R5381 Other malaise: Secondary | ICD-10-CM | POA: Diagnosis not present

## 2012-09-24 DIAGNOSIS — D68311 Acquired hemophilia: Secondary | ICD-10-CM | POA: Diagnosis not present

## 2012-09-24 DIAGNOSIS — R5383 Other fatigue: Secondary | ICD-10-CM | POA: Diagnosis not present

## 2012-09-25 DIAGNOSIS — R195 Other fecal abnormalities: Secondary | ICD-10-CM | POA: Diagnosis not present

## 2012-09-25 DIAGNOSIS — M899 Disorder of bone, unspecified: Secondary | ICD-10-CM | POA: Diagnosis not present

## 2012-09-25 DIAGNOSIS — M1990 Unspecified osteoarthritis, unspecified site: Secondary | ICD-10-CM | POA: Diagnosis not present

## 2012-09-25 DIAGNOSIS — M949 Disorder of cartilage, unspecified: Secondary | ICD-10-CM | POA: Diagnosis not present

## 2012-09-27 DIAGNOSIS — R195 Other fecal abnormalities: Secondary | ICD-10-CM | POA: Diagnosis not present

## 2012-09-28 DIAGNOSIS — R195 Other fecal abnormalities: Secondary | ICD-10-CM | POA: Diagnosis not present

## 2012-10-15 DIAGNOSIS — R5383 Other fatigue: Secondary | ICD-10-CM | POA: Diagnosis not present

## 2012-11-11 DIAGNOSIS — E785 Hyperlipidemia, unspecified: Secondary | ICD-10-CM | POA: Diagnosis not present

## 2012-11-11 DIAGNOSIS — I2510 Atherosclerotic heart disease of native coronary artery without angina pectoris: Secondary | ICD-10-CM | POA: Diagnosis not present

## 2012-11-25 DIAGNOSIS — N390 Urinary tract infection, site not specified: Secondary | ICD-10-CM | POA: Diagnosis not present

## 2012-11-25 DIAGNOSIS — E1159 Type 2 diabetes mellitus with other circulatory complications: Secondary | ICD-10-CM | POA: Diagnosis not present

## 2012-11-27 ENCOUNTER — Inpatient Hospital Stay (HOSPITAL_COMMUNITY)
Admission: EM | Admit: 2012-11-27 | Discharge: 2012-12-11 | DRG: 234 | Disposition: A | Discharge status: Home / Self Care | Payer: Medicare Other | Attending: Cardiothoracic Surgery | Admitting: Cardiothoracic Surgery

## 2012-11-27 ENCOUNTER — Emergency Department (HOSPITAL_COMMUNITY): Payer: Medicare Other

## 2012-11-27 ENCOUNTER — Encounter (HOSPITAL_COMMUNITY): Payer: Self-pay | Admitting: Emergency Medicine

## 2012-11-27 DIAGNOSIS — Z8673 Personal history of transient ischemic attack (TIA), and cerebral infarction without residual deficits: Secondary | ICD-10-CM

## 2012-11-27 DIAGNOSIS — I2510 Atherosclerotic heart disease of native coronary artery without angina pectoris: Secondary | ICD-10-CM | POA: Diagnosis not present

## 2012-11-27 DIAGNOSIS — I200 Unstable angina: Secondary | ICD-10-CM

## 2012-11-27 DIAGNOSIS — I214 Non-ST elevation (NSTEMI) myocardial infarction: Principal | ICD-10-CM | POA: Diagnosis present

## 2012-11-27 DIAGNOSIS — E119 Type 2 diabetes mellitus without complications: Secondary | ICD-10-CM | POA: Diagnosis present

## 2012-11-27 DIAGNOSIS — F17200 Nicotine dependence, unspecified, uncomplicated: Secondary | ICD-10-CM | POA: Diagnosis present

## 2012-11-27 DIAGNOSIS — R5381 Other malaise: Secondary | ICD-10-CM | POA: Diagnosis not present

## 2012-11-27 DIAGNOSIS — I10 Essential (primary) hypertension: Secondary | ICD-10-CM | POA: Diagnosis present

## 2012-11-27 DIAGNOSIS — I272 Pulmonary hypertension, unspecified: Secondary | ICD-10-CM | POA: Diagnosis present

## 2012-11-27 DIAGNOSIS — I270 Primary pulmonary hypertension: Secondary | ICD-10-CM | POA: Diagnosis not present

## 2012-11-27 DIAGNOSIS — J90 Pleural effusion, not elsewhere classified: Secondary | ICD-10-CM | POA: Diagnosis not present

## 2012-11-27 DIAGNOSIS — D68311 Acquired hemophilia: Secondary | ICD-10-CM | POA: Diagnosis not present

## 2012-11-27 DIAGNOSIS — I739 Peripheral vascular disease, unspecified: Secondary | ICD-10-CM | POA: Diagnosis present

## 2012-11-27 DIAGNOSIS — I6529 Occlusion and stenosis of unspecified carotid artery: Secondary | ICD-10-CM | POA: Diagnosis present

## 2012-11-27 DIAGNOSIS — I4891 Unspecified atrial fibrillation: Secondary | ICD-10-CM | POA: Diagnosis present

## 2012-11-27 DIAGNOSIS — E1129 Type 2 diabetes mellitus with other diabetic kidney complication: Secondary | ICD-10-CM | POA: Diagnosis present

## 2012-11-27 DIAGNOSIS — I252 Old myocardial infarction: Secondary | ICD-10-CM | POA: Diagnosis not present

## 2012-11-27 DIAGNOSIS — Z951 Presence of aortocoronary bypass graft: Secondary | ICD-10-CM

## 2012-11-27 DIAGNOSIS — J9819 Other pulmonary collapse: Secondary | ICD-10-CM | POA: Diagnosis not present

## 2012-11-27 DIAGNOSIS — E876 Hypokalemia: Secondary | ICD-10-CM | POA: Diagnosis not present

## 2012-11-27 DIAGNOSIS — I219 Acute myocardial infarction, unspecified: Secondary | ICD-10-CM | POA: Diagnosis not present

## 2012-11-27 DIAGNOSIS — I071 Rheumatic tricuspid insufficiency: Secondary | ICD-10-CM | POA: Diagnosis present

## 2012-11-27 DIAGNOSIS — E785 Hyperlipidemia, unspecified: Secondary | ICD-10-CM | POA: Diagnosis present

## 2012-11-27 DIAGNOSIS — D500 Iron deficiency anemia secondary to blood loss (chronic): Secondary | ICD-10-CM | POA: Diagnosis not present

## 2012-11-27 DIAGNOSIS — I700 Atherosclerosis of aorta: Secondary | ICD-10-CM | POA: Diagnosis not present

## 2012-11-27 DIAGNOSIS — M79609 Pain in unspecified limb: Secondary | ICD-10-CM | POA: Diagnosis not present

## 2012-11-27 DIAGNOSIS — E7800 Pure hypercholesterolemia, unspecified: Secondary | ICD-10-CM | POA: Diagnosis present

## 2012-11-27 DIAGNOSIS — Z01810 Encounter for preprocedural cardiovascular examination: Secondary | ICD-10-CM | POA: Diagnosis not present

## 2012-11-27 DIAGNOSIS — Z9861 Coronary angioplasty status: Secondary | ICD-10-CM

## 2012-11-27 DIAGNOSIS — I079 Rheumatic tricuspid valve disease, unspecified: Secondary | ICD-10-CM | POA: Diagnosis not present

## 2012-11-27 DIAGNOSIS — I279 Pulmonary heart disease, unspecified: Secondary | ICD-10-CM | POA: Diagnosis present

## 2012-11-27 DIAGNOSIS — R079 Chest pain, unspecified: Secondary | ICD-10-CM | POA: Diagnosis not present

## 2012-11-27 DIAGNOSIS — I369 Nonrheumatic tricuspid valve disorder, unspecified: Secondary | ICD-10-CM | POA: Diagnosis not present

## 2012-11-27 DIAGNOSIS — Y831 Surgical operation with implant of artificial internal device as the cause of abnormal reaction of the patient, or of later complication, without mention of misadventure at the time of the procedure: Secondary | ICD-10-CM | POA: Diagnosis present

## 2012-11-27 DIAGNOSIS — T82897A Other specified complication of cardiac prosthetic devices, implants and grafts, initial encounter: Secondary | ICD-10-CM | POA: Diagnosis not present

## 2012-11-27 HISTORY — DX: Pulmonary hypertension, unspecified: I27.20

## 2012-11-27 HISTORY — DX: Essential (primary) hypertension: I10

## 2012-11-27 HISTORY — DX: Peripheral vascular disease, unspecified: I73.9

## 2012-11-27 HISTORY — DX: Rheumatic tricuspid insufficiency: I07.1

## 2012-11-27 LAB — BASIC METABOLIC PANEL
BUN: 21 mg/dL (ref 6–23)
Chloride: 98 mEq/L (ref 96–112)
GFR calc Af Amer: 59 mL/min — ABNORMAL LOW (ref >90)
GFR calc non Af Amer: 51 mL/min — ABNORMAL LOW (ref >90)
Potassium: 4.4 mEq/L (ref 3.5–5.1)
Sodium: 138 mEq/L (ref 135–145)

## 2012-11-27 LAB — GLUCOSE, CAPILLARY
Glucose-Capillary: 283 mg/dL — ABNORMAL HIGH (ref 70–99)
Glucose-Capillary: 164 mg/dL — ABNORMAL HIGH (ref 70–99)

## 2012-11-27 LAB — POCT I-STAT TROPONIN I: Troponin i, poc: 0.48 ng/mL (ref 0.00–0.08)

## 2012-11-27 LAB — CBC
MCHC: 32.4 g/dL (ref 30.0–36.0)
Platelets: 303 10*3/uL (ref 150–400)
RDW: 15.1 % (ref 11.5–15.5)
WBC: 8.1 10*3/uL (ref 4.0–10.5)

## 2012-11-27 LAB — MRSA PCR SCREENING: MRSA by PCR: NEGATIVE

## 2012-11-27 MED ORDER — SODIUM CHLORIDE 0.9 % IV SOLN: INTRAVENOUS | Status: DC

## 2012-11-27 MED ORDER — SODIUM CHLORIDE 0.9 % IV SOLN: 250.0000 mL | INTRAVENOUS | Status: DC | PRN

## 2012-11-27 MED ORDER — SODIUM CHLORIDE 0.9 % IJ SOLN: 3.0000 mL | INTRAMUSCULAR | Status: DC | PRN

## 2012-11-27 MED ORDER — INSULIN ASPART 100 UNIT/ML ~~LOC~~ SOLN
0.0000 [IU] | Freq: Three times a day (TID) | SUBCUTANEOUS | Status: DC
  Administered 2012-11-28 – 2012-12-01 (×5): 2 [IU] via SUBCUTANEOUS
  Administered 2012-12-02: 5 [IU] via SUBCUTANEOUS

## 2012-11-27 MED ORDER — NITROGLYCERIN IN D5W 200-5 MCG/ML-% IV SOLN
10.0000 ug/min | INTRAVENOUS | Status: DC
  Administered 2012-11-27: 10 ug/min via INTRAVENOUS
  Administered 2012-11-28: 40 ug/min via INTRAVENOUS
  Filled 2012-11-27 (×2): qty 250

## 2012-11-27 MED ORDER — HEPARIN BOLUS VIA INFUSION
4000.0000 [IU] | Freq: Once | INTRAVENOUS | Status: AC
  Administered 2012-11-27: 4000 [IU] via INTRAVENOUS

## 2012-11-27 MED ORDER — ASPIRIN 81 MG PO CHEW
324.0000 mg | CHEWABLE_TABLET | ORAL | Status: AC
  Administered 2012-11-28: 08:00:00 324 mg via ORAL
  Filled 2012-11-27: qty 4

## 2012-11-27 MED ORDER — METOPROLOL TARTRATE 100 MG PO TABS
100.0000 mg | ORAL_TABLET | Freq: Two times a day (BID) | ORAL | Status: DC
  Administered 2012-11-27 – 2012-12-03 (×13): 100 mg via ORAL
  Filled 2012-11-27 (×16): qty 1

## 2012-11-27 MED ORDER — ASPIRIN 325 MG PO TABS
325.0000 mg | ORAL_TABLET | Freq: Once | ORAL | Status: AC
  Administered 2012-11-27: 325 mg via ORAL
  Filled 2012-11-27: qty 1

## 2012-11-27 MED ORDER — ATORVASTATIN CALCIUM 40 MG PO TABS
40.0000 mg | ORAL_TABLET | Freq: Every day | ORAL | Status: DC
  Administered 2012-11-28 – 2012-12-10 (×12): 40 mg via ORAL
  Filled 2012-11-27 (×16): qty 1

## 2012-11-27 MED ORDER — SODIUM CHLORIDE 0.9 % IJ SOLN: 3.0000 mL | Freq: Two times a day (BID) | INTRAMUSCULAR | Status: DC

## 2012-11-27 MED ORDER — NITROGLYCERIN 0.4 MG SL SUBL
0.4000 mg | SUBLINGUAL_TABLET | SUBLINGUAL | Status: DC | PRN
  Filled 2012-11-27: qty 25
  Filled 2012-11-27: qty 75

## 2012-11-27 MED ORDER — ONDANSETRON HCL 4 MG/2ML IJ SOLN: 4.0000 mg | Freq: Four times a day (QID) | INTRAMUSCULAR | Status: DC | PRN

## 2012-11-27 MED ORDER — ASPIRIN EC 81 MG PO TBEC: 81.0000 mg | DELAYED_RELEASE_TABLET | Freq: Every day | ORAL | Status: DC

## 2012-11-27 MED ORDER — IRBESARTAN 150 MG PO TABS
150.0000 mg | ORAL_TABLET | Freq: Every day | ORAL | Status: DC
  Administered 2012-11-29 – 2012-12-01 (×3): 150 mg via ORAL
  Filled 2012-11-27 (×3): qty 1

## 2012-11-27 MED ORDER — ACETAMINOPHEN 325 MG PO TABS: 650.0000 mg | ORAL_TABLET | ORAL | Status: DC | PRN

## 2012-11-27 MED ORDER — HEPARIN (PORCINE) IN NACL 100-0.45 UNIT/ML-% IJ SOLN
800.0000 [IU]/h | INTRAMUSCULAR | Status: DC
  Administered 2012-11-27 (×2): 800 [IU]/h via INTRAVENOUS
  Filled 2012-11-27: qty 250

## 2012-11-27 MED ORDER — ASPIRIN EC 81 MG PO TBEC
81.0000 mg | DELAYED_RELEASE_TABLET | Freq: Every day | ORAL | Status: DC
  Administered 2012-11-29 – 2012-12-03 (×4): 81 mg via ORAL
  Filled 2012-11-27 (×6): qty 1

## 2012-11-27 NOTE — ED Provider Notes (Signed)
History     CSN: [id number]  Arrival date & time 11/27/12  1703   First MD Initiated Contact with Patient 11/27/12 1802      Chief Complaint  Patient presents with  . Chest Pain    (Consider location/radiation/quality/duration/timing/severity/associated sxs/prior treatment) The history is provided by the patient.  CHENEE MUNNS is a 72 y.o. female hx of CAD s/p multiple stents, afib here with chest pain. L sided chest pain radiating to her jaw starting around 3pm. Sense of pressure, similar to her previous heart attacks. She took 1 baby aspirin and 2 sl nitro without relief. Given nitro at triage with minimal relief. No cough or fever.    Past Medical History  Diagnosis Date  . Arthritis   . Atrial fibrillation 02/21/2011  . Hypertension   . Hyperlipidemia   . Myocardial infarction   . Diabetes mellitus age 32  . CAD (coronary artery disease)     prior coronary stenting  . Stroke 2001  . Peripheral vascular disease     Past Surgical History  Procedure Date  . Carotid endarterectomy 2001    Bilateral  . Cholecystectomy   . Kidney stone surgery     Removal  . Back surgery     three previous surgeries  . Angioplasty 1984 and 1982  . Abdominal hysterectomy 1975    Family History  Problem Relation Age of Onset  . Other Mother     CVA  . Diabetes Mother   . Heart disease Mother     Heart disease before age 45  . Heart attack Mother   . Heart disease Father     Heart Disease before age 8  . Heart attack Father   . Heart disease Sister     Amputation  . Cancer Sister   . Diabetes Sister   . Heart disease Brother     Heart disease before age 22  . Other Brother     CVA  . Diabetes Brother   . Heart disease Brother     Amputation  . Heart disease Brother   . Heart disease Brother   . Heart disease Sister   . Cancer Sister   . Heart disease Sister   . Cancer Sister   . Diabetes Other   . Cancer Other     breast  . Heart disease Other     cad  . Other  Other     cva  . Diabetes Daughter     History  Substance Use Topics  . Smoking status: Current Every Day Smoker -- 0.3 packs/day for 57 years    Types: Cigarettes  . Smokeless tobacco: Never Used     Comment: pt states she is working on it but husband just passed  . Alcohol Use: No    OB History    Grav Para Term Preterm Abortions TAB SAB Ect Mult Living                  Review of Systems  Cardiovascular: Positive for chest pain.  All other systems reviewed and are negative.    Allergies  Iron; Vicodin; and Vioxx  Home Medications   Current Outpatient Rx  Name  Route  Sig  Dispense  Refill  . ASPIRIN EC 81 MG PO TBEC   Oral   Take 81 mg by mouth daily.         [name] METFORMIN HCL ER (MOD) 1000 MG PO TB24   Oral  Take 1,000 mg by mouth 2 (two) times daily with a meal.          . METOPROLOL SUCCINATE ER 50 MG PO TB24   Oral   Take 50 mg by mouth 2 (two) times daily.          [name] NITROGLYCERIN PO   Oral   Take 0.4 mg by mouth as needed. For chest pain.         [name] ROSUVASTATIN CALCIUM 20 MG PO TABS   Oral   Take 20 mg by mouth at bedtime.          [name] VALSARTAN 160 MG PO TABS   Oral   Take 160 mg by mouth daily.            BP 166/62  Pulse 69  Temp 98.2 F (36.8 C) (Oral)  Resp 18  SpO2 100%  Physical Exam  Nursing note and vitals reviewed. Constitutional: She is oriented to person, place, and time. She appears well-developed.       Slightly uncomfortable   HENT:  Head: Normocephalic.  Mouth/Throat: Oropharynx is clear and moist.  Eyes: Conjunctivae normal are normal. Pupils are equal, round, and reactive to light.  Neck: Normal range of motion. Neck supple.  Cardiovascular: Normal rate, regular rhythm and normal heart sounds.   Pulmonary/Chest: Effort normal and breath sounds normal. No respiratory distress. She has no wheezes. She has no rales.  Abdominal: Soft. Bowel sounds are normal. She exhibits no distension. There is no tenderness.  There is no rebound.  Musculoskeletal: Normal range of motion. She exhibits no edema and no tenderness.  Neurological: She is alert and oriented to person, place, and time.  Skin: Skin is warm and dry.  Psychiatric: She has a normal mood and affect. Her behavior is normal. Judgment and thought content normal.    ED Course  Procedures (including critical care time)  CRITICAL CARE Performed by: Silverio Lay, Danayah Smyre   Total critical care time: 30 min   Critical care time was exclusive of separately billable procedures and treating other patients.  Critical care was necessary to treat or prevent imminent or life-threatening deterioration.  Critical care was time spent personally by me on the following activities: development of treatment plan with patient and/or surrogate as well as nursing, discussions with consultants, evaluation of patient's response to treatment, examination of patient, obtaining history from patient or surrogate, ordering and performing treatments and interventions, ordering and review of laboratory studies, ordering and review of radiographic studies, pulse oximetry and re-evaluation of patient's condition.   Labs Reviewed  CBC - Abnormal; Notable for the following:    Hemoglobin 11.8 (*)     All other components within normal limits  BASIC METABOLIC PANEL - Abnormal; Notable for the following:    Glucose, Bld 178 (*)     GFR calc non Af Amer 51 (*)     GFR calc Af Amer 59 (*)     All other components within normal limits  POCT I-STAT TROPONIN I - Abnormal; Notable for the following:    Troponin i, poc 0.48 (*)     All other components within normal limits  HEPARIN LEVEL (UNFRACTIONATED)  CBC   Dg Chest 2 View  11/27/2012  *RADIOLOGY REPORT*  Clinical Data: Chest pain.  CHEST - 2 VIEW  Comparison: Single of the chest 01/22/2011 and CT chest 01/07/2008.  Findings: There is cardiomegaly without pulmonary edema.  The aorta is markedly ectatic.  Lungs are clear.  No  pneumothorax or pleural effusion.  IMPRESSION: Cardiomegaly without acute disease.   Original Report Authenticated By: Holley Dexter, M.D.      1. NSTEMI (non-ST elevated myocardial infarction)   2. Unstable angina      Date: 11/27/2012- 1   Rate: 76  Rhythm: normal sinus rhythm  QRS Axis: normal  Intervals: normal  ST/T Wave abnormalities: nonspecific ST changes  Conduction Disutrbances:none  Narrative Interpretation: last EKG was in afib. Subtle less than 1 mm STE in lead III   Old EKG Reviewed: changes noted   Date: 11/27/2012  Rate: 71  Rhythm: normal sinus rhythm  QRS Axis: normal  Intervals: normal  ST/T Wave abnormalities: nonspecific ST changes  Conduction Disutrbances:none  Narrative Interpretation: Subtle less than 1 mm STE in lead III   Old EKG Reviewed: unchanged      MDM  SHELBY PELTZ is a 72 y.o. female here with chest pain. Trop positive. No obvious STEMI on repeat EKG. I called her cardiologist from [place]. He will come and evaluate the patient. Patient started on nitro drip and heparin drip for unstable angina.   7:50 PM Dr. Tresa Endo saw the patient and will admit to step down pending cath tomorrow. Patient on heparin drip and nitro drip.          Richardean Canal, MD 11/27/12 [phone]

## 2012-11-27 NOTE — ED Notes (Signed)
Cardiology MD and PA at bedside 

## 2012-11-27 NOTE — Progress Notes (Signed)
ANTICOAGULATION CONSULT NOTE - Initial Consult  Pharmacy Consult for heparin Indication: chest pain/ACS  Allergies  Allergen Reactions  . Iron Hives  . Vicodin (Hydrocodone-Acetaminophen)   . Vioxx (Rofecoxib)     Patient Measurements: weight ~147 lb, height 5'2" (per patient)   Heparin Dosing Weight: 67 kg  Vital Signs: Temp: 98.2 F (36.8 C) (01/23 1708) Temp src: Oral (01/23 1708) BP: 187/71 mmHg (01/23 1708) Pulse Rate: 70  (01/23 1708)  Labs:  Basename 11/27/12 1716  HGB 11.8*  HCT 36.4  PLT 303  APTT --  LABPROT --  INR --  HEPARINUNFRC --  CREATININE 1.06  CKTOTAL --  CKMB --  TROPONINI --    The CrCl is unknown because both a height and weight (above a minimum accepted value) are required for this calculation.   Medical History: Past Medical History  Diagnosis Date  . Arthritis   . Atrial fibrillation 02/21/2011  . Hypertension   . Hyperlipidemia   . Myocardial infarction   . Diabetes mellitus age 9  . CAD (coronary artery disease)     prior coronary stenting  . Stroke 2001  . Peripheral vascular disease     Medications:  See med rec  Assessment: Patient is a 72 y.o F presented to the ED with c/o CP and found to have elevated troponin.  Patient started that she was on coumadin at one point for afib and was subsequently switched to xarelto about 3 months ago.  She started her cardiologist Dr Royann Shivers took her off xarelto in December because she had "internal bleeding" or noticed blood in her stools.  She is supposed to see him again soon and to evaluate plan for anticoagulation at that time.  Patient reported that her "internal bleeding" has revolved and has no active bleeding at this point.   Goal of Therapy:  Heparin level 0.3-0.7 units/ml Monitor platelets by anticoagulation protocol: Yes   Plan:  1) heparin 4000 units IV x1 bolus, then heparin drip at 800 units/hr 2) check 6 hour heparin level  Kendra Todd 11/27/2012,6:18 PM

## 2012-11-27 NOTE — ED Notes (Addendum)
Pt presents to ED with chest pain to neck and jaw that started 1 hour ago. Pt has history of MI's. Pt took 1 baby asa and 2 sl nitro tabs without any relief.

## 2012-11-27 NOTE — H&P (Signed)
Kendra Todd is an 72 y.o. female.   Chief Complaint:  Chest pain HPI:   The patient is a 72 year old woman with extensive atherosclerotic disease involving the carotid arteries, lower extremities, and coronaries. She has had bilateral carotid endarterectomies and has evidence of restenosis in the right internal carotid artery, but no recent focal neurological complaints. She has previously had 2 myocardial infarctions and has undergone percutaneous revascularization of the left circumflex coronary artery in 1993 and stents to the ostial mid right coronary artery in 2002 (both bare-metal stents). Her history includes hypertension, type 2 diabetes mellitus, hyperlipidemia PAF, CVA.  She was on coumadin until November of last year then switch to Xarelto but then recently it was [facility] due to hematochezia.  She presents today with chest pain which began about 1530hrs today while she was playing games on the computer.  The pain was pressure-like, 7/10, and felt like someone was sitting on her.  It radiated to her neck and left shoulder with associated nausea and dizziness.  She denies vomiting, palpitations, diaphoresis, cough, congestion, fever, ABD pain, LEE, orthopnea, PND.  She took one baby ASA and two nitro with no change in pain level.  She also reports being on an antibiotic recently for a UTI.  Past Medical History  Diagnosis Date  . Arthritis   . Atrial fibrillation 02/21/2011  . Hypertension   . Hyperlipidemia   . Myocardial infarction   . Diabetes mellitus age 30  . CAD (coronary artery disease)     prior coronary stenting  . Stroke 2001  . Peripheral vascular disease     Past Surgical History  Procedure Date  . Carotid endarterectomy 2001    Bilateral  . Cholecystectomy   . Kidney stone surgery     Removal  . Back surgery     three previous surgeries  . Angioplasty 1984 and 1982  . Abdominal hysterectomy 1975    Family History  Problem Relation Age of Onset  . Other Mother    CVA  . Diabetes Mother   . Heart disease Mother     Heart disease before age 59  . Heart attack Mother   . Heart disease Father     Heart Disease before age 28  . Heart attack Father   . Heart disease Sister     Amputation  . Cancer Sister   . Diabetes Sister   . Heart disease Brother     Heart disease before age 74  . Other Brother     CVA  . Diabetes Brother   . Heart disease Brother     Amputation  . Heart disease Brother   . Heart disease Brother   . Heart disease Sister   . Cancer Sister   . Heart disease Sister   . Cancer Sister   . Diabetes Other   . Cancer Other     breast  . Heart disease Other     cad  . Other Other     cva  . Diabetes Daughter    Social History:  reports that she has been smoking Cigarettes.  She has a 17.1 pack-year smoking history. She has never used smokeless tobacco. She reports that she does not drink alcohol or use illicit drugs.  Allergies:  Allergies  Allergen Reactions  . Iron Hives  . Vicodin (Hydrocodone-Acetaminophen)   . Vioxx (Rofecoxib)      (Not in a hospital admission)  Results for orders placed during the hospital encounter of  11/27/12 (from the past 48 hour(s))  CBC     Status: Abnormal   Collection Time   11/27/12  5:16 PM      Component Value Range Comment   WBC 8.1  4.0 - 10.5 K/uL    RBC 4.53  3.87 - 5.11 MIL/uL    Hemoglobin 11.8 (*) 12.0 - 15.0 g/dL    HCT 40.9  81.1 - 91.4 %    MCV 80.4  78.0 - 100.0 fL    MCH 26.0  26.0 - 34.0 pg    MCHC 32.4  30.0 - 36.0 g/dL    RDW 78.2  95.6 - 21.3 %    Platelets 303  150 - 400 K/uL   BASIC METABOLIC PANEL     Status: Abnormal   Collection Time   11/27/12  5:16 PM      Component Value Range Comment   Sodium 138  135 - 145 mEq/L    Potassium 4.4  3.5 - 5.1 mEq/L    Chloride 98  96 - 112 mEq/L    CO2 28  19 - 32 mEq/L    Glucose, Bld 178 (*) 70 - 99 mg/dL    BUN 21  6 - 23 mg/dL    Creatinine, Ser 0.86  0.50 - 1.10 mg/dL    Calcium 9.6  8.4 - 57.8 mg/dL     GFR calc non Af Amer 51 (*) >90 mL/min    GFR calc Af Amer 59 (*) >90 mL/min   POCT I-STAT TROPONIN I     Status: Abnormal   Collection Time   11/27/12  5:36 PM      Component Value Range Comment   Troponin i, poc 0.48 (*) 0.00 - 0.08 ng/mL    Comment NOTIFIED PHYSICIAN      Comment 3             Dg Chest 2 View  11/27/2012  *RADIOLOGY REPORT*  Clinical Data: Chest pain.  CHEST - 2 VIEW  Comparison: Single of the chest 01/22/2011 and CT chest 01/07/2008.  Findings: There is cardiomegaly without pulmonary edema.  The aorta is markedly ectatic.  Lungs are clear.  No pneumothorax or pleural effusion.  IMPRESSION: Cardiomegaly without acute disease.   Original Report Authenticated By: Holley Dexter, M.D.     Review of Systems  Constitutional: Negative for fever and diaphoresis.  HENT: Positive for neck pain. Negative for congestion and sore throat.   Respiratory: Negative for cough and shortness of breath.   Cardiovascular: Positive for chest pain. Negative for palpitations, orthopnea, leg swelling and PND.  Gastrointestinal: Positive for nausea. Negative for vomiting, abdominal pain, diarrhea, constipation, blood in stool and melena.  Genitourinary: Negative for dysuria and hematuria.  Neurological: Positive for dizziness.    Blood pressure 188/66, pulse 74, temperature 98.2 F (36.8 C), temperature source Oral, resp. rate 19, SpO2 99.00%. Physical Exam  Constitutional: She is oriented to person, place, and time. She appears well-developed and well-nourished. No distress.  HENT:  Head: Normocephalic and atraumatic.  Mouth/Throat: No oropharyngeal exudate.  Eyes: EOM are normal. Pupils are equal, round, and reactive to light. No scleral icterus.  Neck: Normal range of motion. Neck supple. No JVD present.  Cardiovascular: Normal rate, regular rhythm, S1 normal and S2 normal.   No murmur heard. Pulses:      Carotid pulses are on the right side with bruit, and on the left side with  bruit.      Radial pulses are 2+  on the right side, and 2+ on the left side.       Dorsalis pedis pulses are 2+ on the right side, and 2+ on the left side.  Respiratory: Effort normal and breath sounds normal. She has no wheezes. She has no rales.  GI: Soft. Bowel sounds are normal. There is no tenderness.  Musculoskeletal: She exhibits no edema.  Lymphadenopathy:    She has no cervical adenopathy.  Neurological: She is alert and oriented to person, place, and time.  Skin: Skin is dry.  Psychiatric: She has a normal mood and affect.     Assessment/Plan Patient Active Hospital Problem List: NSTEMI (non-ST elevated myocardial infarction) (11/27/2012) Occlusion and stenosis of carotid artery without mention of cerebral infarction (02/14/2012) PAD (peripheral artery disease) (11/27/2012) CAD (coronary artery disease): 1993 stent to circumflex.  2002 two stents to ostial, mid RCA (11/27/2012) DM2 (diabetes mellitus, type 2) (11/27/2012) HTN (hypertension) (11/27/2012) HLD (hyperlipidemia) (11/27/2012)  Plan:  Admit to SS.  Continue IV nitro and heparin.  Adjust beta blocker.   Hold metformin.   Wilburt Finlay 11/27/2012, 6:52 PM   Patient seen and examined. Agree with assessment and plan. Very pleasant 72 yo AAF who has history of CAD/PVD s/p prior coronary stents in 1993 to LCX and in 2002 to RCA. She is s/p Bilateral CEA, h/o DM, HTN, hyperlipidemia who presents today with substernal chest pain worrisome for angina. ECG is without acute change but there is subtle J point elevation inferiorly. Pain has improved with heparin and NTG. Will admit, titrate NTG for complete pain relief and BP control.  Will keep NPO in anticipation of cath tomorrow. Will increase beta blocker, continue ARB, but hold in am in anticipation of cath.   Lennette Bihari, MD, [facility] 11/27/2012 7:25 PM

## 2012-11-27 NOTE — ED Notes (Signed)
Abnormal lab test results reported to Kendra Todd, Forensic scientist; pt is in main ED waiting area; will move pt directly to a room

## 2012-11-28 ENCOUNTER — Encounter (HOSPITAL_COMMUNITY): Payer: Self-pay | Admitting: Cardiology

## 2012-11-28 ENCOUNTER — Other Ambulatory Visit: Payer: Self-pay | Admitting: *Deleted

## 2012-11-28 ENCOUNTER — Inpatient Hospital Stay (HOSPITAL_COMMUNITY): Payer: Medicare Other

## 2012-11-28 ENCOUNTER — Encounter (HOSPITAL_COMMUNITY)
Admission: EM | Disposition: A | Discharge status: Home / Self Care | Payer: Self-pay | Source: Home / Self Care | Attending: Cardiothoracic Surgery

## 2012-11-28 DIAGNOSIS — I2510 Atherosclerotic heart disease of native coronary artery without angina pectoris: Secondary | ICD-10-CM

## 2012-11-28 HISTORY — PX: LEFT HEART CATHETERIZATION WITH CORONARY ANGIOGRAM: SHX5451

## 2012-11-28 LAB — GLUCOSE, CAPILLARY
Glucose-Capillary: 124 mg/dL — ABNORMAL HIGH (ref 70–99)
Glucose-Capillary: 116 mg/dL — ABNORMAL HIGH (ref 70–99)
Glucose-Capillary: 129 mg/dL — ABNORMAL HIGH (ref 70–99)
Glucose-Capillary: 94 mg/dL (ref 70–99)

## 2012-11-28 LAB — BASIC METABOLIC PANEL
BUN: 17 mg/dL (ref 6–23)
CO2: 24 mEq/L (ref 19–32)
GFR calc non Af Amer: 64 mL/min — ABNORMAL LOW (ref >90)
Glucose, Bld: 125 mg/dL — ABNORMAL HIGH (ref 70–99)
Potassium: 4.3 mEq/L (ref 3.5–5.1)
Sodium: 137 mEq/L (ref 135–145)

## 2012-11-28 LAB — CBC
Hemoglobin: 10.5 g/dL — ABNORMAL LOW (ref 12.0–15.0)
MCH: 25.5 pg — ABNORMAL LOW (ref 26.0–34.0)
MCV: 79.6 fL (ref 78.0–100.0)
Platelets: 317 10*3/uL (ref 150–400)

## 2012-11-28 LAB — HEMOGLOBIN A1C: Mean Plasma Glucose: 148 mg/dL — ABNORMAL HIGH (ref <117)

## 2012-11-28 LAB — TROPONIN I: Troponin I: 2.15 ng/mL (ref <0.30)

## 2012-11-28 LAB — HEPATIC FUNCTION PANEL
AST: 24 U/L (ref 0–37)
Albumin: 3.3 g/dL — ABNORMAL LOW (ref 3.5–5.2)
Alkaline Phosphatase: 50 U/L (ref 39–117)
Bilirubin, Direct: <0.1 mg/dL (ref 0.0–0.3)
Total Bilirubin: 0.3 mg/dL (ref 0.3–1.2)

## 2012-11-28 LAB — LIPID PANEL
Cholesterol: 160 mg/dL (ref 0–200)
HDL: 49 mg/dL (ref >39)
Total CHOL/HDL Ratio: 3.3 RATIO
VLDL: 12 mg/dL (ref 0–40)

## 2012-11-28 LAB — POCT ACTIVATED CLOTTING TIME: Activated Clotting Time: 160 seconds

## 2012-11-28 SURGERY — LEFT HEART CATHETERIZATION WITH CORONARY ANGIOGRAM
Anesthesia: LOCAL

## 2012-11-28 MED ORDER — HEPARIN (PORCINE) IN NACL 100-0.45 UNIT/ML-% IJ SOLN
1050.0000 [IU]/h | INTRAMUSCULAR | Status: DC
  Administered 2012-11-28: 19:00:00 800 [IU]/h via INTRAVENOUS
  Administered 2012-11-29: 08:00:00 950 [IU]/h via INTRAVENOUS
  Administered 2012-11-30: 1000 [IU]/h via INTRAVENOUS
  Administered 2012-12-01: 1050 [IU]/h via INTRAVENOUS
  Filled 2012-11-28 (×7): qty 250

## 2012-11-28 MED ORDER — MIDAZOLAM HCL 2 MG/2ML IJ SOLN
INTRAMUSCULAR | Status: AC
  Filled 2012-11-28: qty 2

## 2012-11-28 MED ORDER — NITROGLYCERIN 0.2 MG/ML ON CALL CATH LAB
INTRAVENOUS | Status: AC
  Filled 2012-11-28: qty 1

## 2012-11-28 MED ORDER — SODIUM CHLORIDE 0.9 % IV SOLN: 250.0000 mL | INTRAVENOUS | Status: DC | PRN

## 2012-11-28 MED ORDER — SODIUM CHLORIDE 0.9 % IJ SOLN
3.0000 mL | Freq: Two times a day (BID) | INTRAMUSCULAR | Status: DC
  Administered 2012-11-28 – 2012-12-02 (×3): 3 mL via INTRAVENOUS

## 2012-11-28 MED ORDER — LIDOCAINE HCL (PF) 1 % IJ SOLN
INTRAMUSCULAR | Status: AC
  Filled 2012-11-28: qty 30

## 2012-11-28 MED ORDER — ALBUTEROL SULFATE (5 MG/ML) 0.5% IN NEBU
2.5000 mg | INHALATION_SOLUTION | Freq: Once | RESPIRATORY_TRACT | Status: AC
  Administered 2012-11-28: 16:00:00 2.5 mg via RESPIRATORY_TRACT

## 2012-11-28 MED ORDER — SODIUM CHLORIDE 0.9 % IJ SOLN: 3.0000 mL | INTRAMUSCULAR | Status: DC | PRN

## 2012-11-28 MED ORDER — HEART ATTACK BOUNCING BOOK
Freq: Once | Status: AC
  Administered 2012-11-28: 07:00:00
  Filled 2012-11-28: qty 1

## 2012-11-28 MED ORDER — SODIUM CHLORIDE 0.9 % IV SOLN
1.0000 mL/kg/h | INTRAVENOUS | Status: AC
  Administered 2012-11-28: 1 mL/kg/h via INTRAVENOUS

## 2012-11-28 MED ORDER — HEPARIN (PORCINE) IN NACL 2-0.9 UNIT/ML-% IJ SOLN
INTRAMUSCULAR | Status: AC
  Filled 2012-11-28: qty 1000

## 2012-11-28 MED ORDER — GLUCERNA SHAKE PO LIQD
237.0000 mL | ORAL | Status: DC | PRN
  Filled 2012-11-28: qty 237

## 2012-11-28 MED ORDER — FENTANYL CITRATE 0.05 MG/ML IJ SOLN
INTRAMUSCULAR | Status: AC
  Filled 2012-11-28: qty 2

## 2012-11-28 NOTE — Progress Notes (Addendum)
NAME:  Kendra Todd   MRN: [id number] DOB:  04/29/1941   ADMIT DATE: 11/27/2012  Subjective: No complaints; No further Angina  Objective: Vital signs in last 24 hours: Temp:  [97.7 F (36.5 C)-98.2 F (36.8 C)] 98.1 F (36.7 C) (01/24 0500) Pulse Rate:  [55-74] 62  (01/24 0500) Resp:  [14-21] 14  (01/24 0500) BP: (108-188)/(48-86) 108/81 mmHg (01/24 0500) SpO2:  [96 %-100 %] 98 % (01/24 0500) Weight:  [61.9 kg (136 lb 7.4 oz)-64.7 kg (142 lb 10.2 oz)] 64.7 kg (142 lb 10.2 oz) (01/24 0030) Weight change:  Last BM Date: 11/27/12 Intake/Output from previous day: -256 01/23 0701 - 01/24 0700 In: 443.9 [P.O.:240; I.V.:203.9] Out: 700 [Urine:700] Intake/Output this shift:    PE: General appearance: alert, cooperative, appears stated age and no distress Neck: no adenopathy, no carotid bruit and no JVD Lungs: clear to auscultation bilaterally and normal percussion bilaterally Heart: regular rate and rhythm, S1, S2 normal, no murmur, click, rub or gallop Abdomen: soft, non-tender; bowel sounds normal; no masses,  no organomegaly Extremities: extremities normal, atraumatic, no cyanosis or edema Pulses: 2+ and symmetric Neurologic: Grossly normal; CN II-XII grossly intact.    Lab Results:  Basename 11/28/12 0239 11/27/12 1716  WBC 9.9 8.1  HGB 10.5* 11.8*  HCT 32.7* 36.4  PLT 317 303   BMET  Basename 11/27/12 1716  NA 138  K 4.4  CL 98  CO2 28  GLUCOSE 178*  BUN 21  CREATININE 1.06  CALCIUM 9.6    Basename 11/28/12 0239 11/27/12 2123  TROPONINI 1.91* 1.89*    Lab Results  Component Value Date   CHOL 160 11/28/2012   HDL 49 11/28/2012   LDLCALC 99 11/28/2012   TRIG 59 11/28/2012   CHOLHDL 3.3 11/28/2012   No results found for this basename: HGBA1C     Lab Results  Component Value Date   TSH 0.990 01/22/2011    Hepatic Function Panel No results found for this basename: PROT,ALBUMIN,AST,ALT,ALKPHOS,BILITOT,BILIDIR,IBILI in the last 72 hours  Basename 11/28/12  0239  CHOL 160     Studies/Results: Dg Chest 2 View  11/27/2012  *RADIOLOGY REPORT*  Clinical Data: Chest pain.  CHEST - 2 VIEW  Comparison: Single of the chest 01/22/2011 and CT chest 01/07/2008.  Findings: There is cardiomegaly without pulmonary edema.  The aorta is markedly ectatic.  Lungs are clear.  No pneumothorax or pleural effusion.  IMPRESSION: Cardiomegaly without acute disease.   Original Report Authenticated By: Holley Dexter, M.D.     Medications: I have reviewed the patient's current medications.    [name] aspirin  324 mg Oral Pre-Cath  . aspirin EC  81 mg Oral Daily  . atorvastatin  40 mg Oral q1800  . insulin aspart  0-15 Units Subcutaneous TID WC  . irbesartan  150 mg Oral Daily  . metoprolol tartrate  100 mg Oral BID  . sodium chloride  3 mL Intravenous Q12H   Assessment/Plan: Principal Problem:  *NSTEMI (non-ST elevated myocardial infarction) Active Problems:  CAD (coronary artery disease): 1993 PTCA to circumflex.  2002 2 BMS stents to ostial, mid RCA  DM2 (diabetes mellitus, type 2)  HTN (hypertension)  HLD (hyperlipidemia)  PAD (peripheral artery disease)   PLAN: troponin at 1.91 this am. On IV Heparin and IV NTG.  For cardiac cath this am.  Mild anemia.  Will recheck BMP, and check pro time for cath.  LOS: 1 day   INGOLD,LAURA R 11/28/2012, 8:07 AM  I have seen  and evaluated the patient this AM along with Nada Boozer, NP. I agree with her findings, examination (my exam above) as well as impression recommendations. No further angina o/n -- admitted & placed on IV Heparin & NTG gtt. 325 mg ASA given. AM ECG with more acute T waves in septal leads & inverted T in III.  Troponin + (1.9).  Renal function stable.  Plan LHC/Angio +/- PCI today.  BP & HR stable on ARB & BB. CBGs well controlled - metformin on hold for cath. SSI.    Performing MD:  Marykay Lex, M.D., M.S.  Procedure:  Left Heart Catheterization with Coronary Angiography +/-  Percutaneous Coronary Intervention.  The procedure with Risks/Benefits/Alternatives and Indications was reviewed with the patient.  All questions were answered.    Risks / Complications include, but not limited to: Death, MI, CVA/TIA, VF/VT (with defibrillation), Bradycardia (need for temporary pacer placement), contrast induced nephropathy, bleeding / bruising / hematoma / pseudoaneurysm, vascular or coronary injury (with possible emergent CT or Vascular Surgery), adverse medication reactions, infection.    The patient voices understanding and agree to proceed.   I have signed the consent form and placed it on the chart for patient signature and RN witness.     Marykay Lex, M.D., M.S. [facility] [address] [place], [place]  [address]  [phone]  11/28/2012 9:06 AM

## 2012-11-28 NOTE — Progress Notes (Signed)
Utilization Review Completed Jumana Paccione J. Phinehas Grounds, RN, BSN, NCM [phone]  

## 2012-11-28 NOTE — Progress Notes (Signed)
ANTICOAGULATION CONSULT NOTE  Pharmacy Consult for heparin Indication: chest pain/ACS  Allergies  Allergen Reactions  . Iron Hives  . Vicodin (Hydrocodone-Acetaminophen)   . Vioxx (Rofecoxib)     Patient Measurements:  Height: 5\' 2"  (157.5 cm) Weight: 142 lb 10.2 oz (64.7 kg) IBW/kg (Calculated) : 50.1  Heparin Dosing Weight: 67 kg  Vital Signs: Temp: 97.7 F (36.5 C) (01/24 0030) Temp src: Oral (01/24 0030) BP: 116/58 mmHg (01/24 0100) Pulse Rate: 55  (01/24 0100)  Labs:  Basename 11/28/12 0239 11/27/12 2123 11/27/12 1716  HGB 10.5* -- 11.8*  HCT 32.7* -- 36.4  PLT 317 -- 303  APTT -- -- --  LABPROT -- -- --  INR -- -- --  HEPARINUNFRC 0.58 -- --  CREATININE -- -- 1.06  CKTOTAL -- -- --  CKMB -- -- --  TROPONINI -- 1.89* --    Estimated Creatinine Clearance: 41.7 ml/min (by C-G formula based on Cr of 1.06).  Assessment: 72 y.o Female with chest pain for Heparin.   Goal of Therapy:  Heparin level 0.3-0.7 units/ml Monitor platelets by anticoagulation protocol: Yes   Plan:  Continue Heparin at current rate  F/U after cath  Eddie Candle 11/28/2012,3:33 AM

## 2012-11-28 NOTE — Progress Notes (Signed)
INITIAL NUTRITION ASSESSMENT  DOCUMENTATION CODES Per approved criteria  -Not Applicable   INTERVENTION: 1. Glucerna Shake po PRN, each supplement provides 220 kcal and 10 grams of protein.   2. RD will continue to follow   NUTRITION DIAGNOSIS: Unintentional weight loss related to poor po intake as evidenced by MST report.   Goal: Meet >/=90% estimated nutrition needs  Monitor:  PO intake, weight trends, I/O's  Reason for Assessment: Malnutrition Screening Tool  72 y.o. female  Admitting Dx: NSTEMI (non-ST elevated myocardial infarction)  ASSESSMENT: Pt admitted with NSETMI, s/p cardiac cath.   Pt indicated unintentional weight loss of >34 lbs PTA, also indicated poor po intake.  Weight hx shows 18 lb weight loss in 8 months, (11%, not significant).  No meal intake documented at time of RD note. Pt was unavailable to discuss weight hx with.   Given pt BMI, some weight loss was appropriate for this pt.   RD will add nutrition supplements PRN for poor meal completion.   Height: Ht Readings from Last 1 Encounters:  11/27/12 5\' 2"  (1.575 m)    Weight: Wt Readings from Last 1 Encounters:  11/28/12 142 lb 10.2 oz (64.7 kg)    Ideal Body Weight: 110 lbs   % Ideal Body Weight: 129%  Wt Readings from Last 10 Encounters:  11/28/12 142 lb 10.2 oz (64.7 kg)  11/28/12 142 lb 10.2 oz (64.7 kg)  09/11/12 151 lb (68.493 kg)  03/06/12 160 lb 4.8 oz (72.712 kg)  02/14/12 158 lb 6.4 oz (71.85 kg)  07/05/11 160 lb (72.576 kg)    Usual Body Weight: 160 lbs   % Usual Body Weight: 89%  BMI:  Body mass index is 26.09 kg/(m^2). Overweight    Estimated Nutritional Needs: Kcal: 1400-1600 Protein:  65-75 gm  Fluid: 1.4-1.6 L/day  Skin: intact   Diet Order: Cardiac   EDUCATION NEEDS: -No education needs identified at this time   Intake/Output Summary (Last 24 hours) at 11/28/12 1352 Last data filed at 11/28/12 0600  Gross per 24 hour  Intake 443.87 ml  Output     700 ml  Net -256.13 ml    Last BM: PTA    Labs:   Lab 11/28/12 0920 11/27/12 1716  NA 137 138  K 4.3 4.4  CL 101 98  CO2 24 28  BUN 17 21  CREATININE 0.88 1.06  CALCIUM 9.2 9.6  MG -- --  PHOS -- --  GLUCOSE 125* 178*    CBG (last 3)   Basename 11/28/12 1210 11/28/12 1103 11/28/12 0749  GLUCAP 109* 116* 124*    Scheduled Meds:   . aspirin EC  81 mg Oral Daily  . atorvastatin  40 mg Oral q1800  . insulin aspart  0-15 Units Subcutaneous TID WC  . irbesartan  150 mg Oral Daily  . metoprolol tartrate  100 mg Oral BID  . sodium chloride  3 mL Intravenous Q12H    Continuous Infusions:   . sodium chloride    . heparin    . nitroGLYCERIN 40 mcg/min (11/27/12 1935)    Past Medical History  Diagnosis Date  . Arthritis   . Atrial fibrillation 02/21/2011  . Hypertension   . Hyperlipidemia   . Myocardial infarction   . Diabetes mellitus age 38  . CAD (coronary artery disease)     prior coronary stenting  . Stroke 2001  . Peripheral vascular disease   . Carotid artery disease 2001    s/p Bilateral CEA,  after CVA    Past Surgical History  Procedure Date  . Carotid endarterectomy 2001    Bilateral  . Cholecystectomy   . Kidney stone surgery     Removal  . Back surgery     three previous surgeries  . Angioplasty 1984 and 1982  . Abdominal hysterectomy 1975  . Percutaneous coronary stent intervention (pci-s) 2002    2 BMS in ostial/Prox & mid RCA (mid 2.5 mm 20x  mm, & 2.75 mm x  8 mm ostial)    Clarene Duke RD, LDN Pager [phone] After Hours pager [phone]

## 2012-11-28 NOTE — Progress Notes (Signed)
ANTICOAGULATION CONSULT NOTE - Follow Up Consult  Pharmacy Consult for Heparin Indication: NSTEMI  Allergies  Allergen Reactions  . Iron Hives  . Vicodin (Hydrocodone-Acetaminophen)   . Vioxx (Rofecoxib)     Patient Measurements: Height: 5\' 2"  (157.5 cm) Weight: 142 lb 10.2 oz (64.7 kg) IBW/kg (Calculated) : 50.1  Heparin Dosing Weight:   Vital Signs: Temp: 98.1 F (36.7 C) (01/24 0500) Temp src: Oral (01/24 0500) BP: 113/48 mmHg (01/24 0700) Pulse Rate: 62  (01/24 0951)  Labs:  Basename 11/28/12 0920 11/28/12 0239 11/27/12 2123 11/27/12 1716  HGB -- 10.5* -- 11.8*  HCT -- 32.7* -- 36.4  PLT -- 317 -- 303  APTT -- -- -- --  LABPROT 13.9 -- -- --  INR 1.08 -- -- --  HEPARINUNFRC -- 0.58 -- --  CREATININE 0.88 -- -- 1.06  CKTOTAL -- -- -- --  CKMB -- -- -- --  TROPONINI 2.15* 1.91* 1.89* --    Estimated Creatinine Clearance: 50.2 ml/min (by C-G formula based on Cr of 0.88).  Assessment: Patient is a 72 y.o F presented to the ED with c/o CP and found to have elevated troponin. Patient started that she was on coumadin at one point for afib and was subsequently switched to xarelto about 3 months ago. She started her cardiologist Dr Royann Shivers took her off Xarelto in December because she had "internal bleeding" or noticed blood in her stools. She is supposed to see him again soon and to evaluate plan for anticoagulation at that time. Patient reported that her "internal bleeding" has revolved and has no active bleeding at this point.  Anticoagulation: heparin Rx for CP; Trop 0.58; been off xarelto since December d/t GI bleed. Hgb dropped from 11.8 to 10.5.  Cardiovascular: CAD, HTN, HLD, hx afib. 113/48, HR 62. Troponins 1.89,1.91,2.15 Meds: ASA 81mg , Lipitor, Avapro, metoprolol Cath 1/24: Severe MV CAD. Consult CVTS  Endocrinology: DM on SSI. CBGs 124, 116 today  Gastrointestinal / Nutrition: hx of GI bleed. No PPI noted.  Neurology:N/A  Nephrology: scr  0.88  Hematology / Oncology: cbc ok  PTA Medication Issues: Metformin   Goal of Therapy:  Heparin level 0.3-0.7 units/ml Monitor platelets by anticoagulation protocol: Yes   Plan:  Resume heparin at previous rate 800 units/hr at 1900 this pm. Will check heparin level, CBC daily.   Kendra Todd, PharmD, BCPS Clinical Staff Pharmacist Pager [phone]  Kendra Todd 11/28/2012,12:38 PM

## 2012-11-28 NOTE — CV Procedure (Addendum)
[facility] CARDIAC CATHETERIZATION REPORT  NAME:  Kendra Todd   MRN: [id number] DOB:  1941/01/08   ADMIT DATE: 11/27/2012 Procedure Date: 11/28/2012   INTERVENTIONAL CARDIOLOGIST: Marykay Lex, M.D., MS PRIMARY CARE PROVIDER: Kimber Relic, MD PRIMARY CARDIOLOGIST: Thurmon Fair, MD   PATIENT:  Kendra Todd is a 72 y.o. female with a past medical history notable for PTCA of the circumflex system in 1993, 2 bare-metal stents placed in the ostial and proximal to mid RCA in 2001, bilateral carotid artery disease status post bilateral carotid endarterectomy in 2002, as well as diabetes, hypertension, and hyperlipidemia. She had been relatively stable cardiac standpoint but presented with yesterday evening 11/27/2012 with the new onset of substernal chest pressure and ruled in for Non-ST Elevation MI with a Troponin of 1.91.  She's been on IV heparin and nitroglycerin overnight and has been free of any significant anginal symptoms since admission. Based on her history she is referred for invasive cardiac evaluation via cardiac catheterization.  PRE-OPERATIVE DIAGNOSIS:    Non-STEMI  Known coronary artery disease status post PCI to RCA  PROCEDURES PERFORMED:    Left Heart Catheterization with Native Coronary Artery Angiography  Left Ventriculography  PROCEDURE: Consent:  Risks of procedure as well as the alternatives and risks of each were explained to the (patient/caregiver).  Consent for procedure obtained. Consent for signed by MD and patient with RN witness -- placed on chart.  PROCEDURE: The patient was brought to the 2nd Floor [facility] in the fasting state and prepped and draped in the usual sterile fashion for Left groin access. Sterile technique was used including antiseptics, cap, gloves, gown, hand hygiene, mask and sheet.  Skin prep: Chlorhexidine.  Time Out: Verified patient identification, verified procedure, site/side  was marked, verified correct patient position, special equipment/implants available, medications/allergies/relevent history reviewed, required imaging and test results available.  Performed  Access: Left Common Femoral Artery; 5 Fr Sheath - fluoroscopically guided modified Seldinger technique. Diagnostic Catheterization/Angiography:  5 Fr Catheters advanced  and exchanged over standard J-wire.  Left Coronary Artery Angiography: JL4 Catheter  Right Coronary Artery Angiography: After unsuccessful use of JR 4, Williams Right Catheter  LV Hemodynamics (LV Gram): Angled Pigtail Catheter Catheters were removed completely out of the body. The sheath was removed in the PACU holding area based on an ACT value.  MEDICATIONS:  ANESTHESIA:   Local Lidocaine 18 ml  SEDATION:  2 mg IV Versed, 50 mcg IV Fentanyl  CONTRAST: 120 L Omnipaque  Hemodynamics:  Central Aortic / Mean Pressures: 158/14 mmHg; 18 mmHg  Left Ventricular Pressures / EDP: 155/63 mmHg; 98 mmHg  Left Ventriculography:  EF: 60-65 %  Wall Motion: Normal  Coronary Anatomy:  Left Main: Large-caliber calcified vessel with distal 40-50% lesion that involves the ostium of both the LAD and Circumflex. Calcification is more dense in the distal end of the vessel where the lesion is. LAD: Heavily calcified vessel proximally with ostial 60-70% stenosis followed by a heavily calcified area following the takeoff of a small caliber first diagonal branch. There is a long tubular 40-50% stenosis followed by a 70-80% stenosis just after the takeoff of bifurcating second diagonal. There is a major septal perforator that comes off at the same site as D2. There is several smaller septal perforators distally. The LAD gives off several small diagonal branches that are not significant size as it reaches down around the apex perfusing the distal third of the inferoapex. The  remainder the vessel after D2 is relatively free of disease in a good distal  target.  D1: Small-caliber (1.5 mm) vessel it covers a large portion of the proximal anterolateral wall. Diffuse luminal irregularities but nothing significant.  D2: Small to moderate caliber vessel at the ostium but bifurcates shortly after the takeoff into 2 branches that are L3 small in diameter. The more distal branch has a early mid 80-90% stenosis. The ostium of the vessel itself has very focal 50-60% stenosis involving with the LAD lesion. Left Circumflex: Large-caliber vessel that gives rise to part of the posterior lateral system. The ostium of the vessel has a focal hazy point that suggest roughly EE 60% stenosis but very difficult to actually see this area angiographically. There is a very small dual marginal branches they're not in a number as well as a large atrial branch. The circumflex then courses into the AV groove where it gives rise to a another small OM that is appears to be diffusely diseased. Following this there is a focal 60-70% stenosis in a hinge point before the vessel then branches into several vessels including a lateral OM (OM 1), a more distal inferolateral OM/posterolateral branch (OM 2), as well as a small continuation in the AV groove vessel that  Courses around to the distal posterior lateral segment and even potentially gives off what appears to be a small/short PDA.  OM1: Relatively small caliber vessel with diffuse 70-80% stenosis just after the proximal segment.  OM 2: Likely a moderate caliber vessel with 80-90% tubular stenosis just after the branch point prior to somewhat normal segment of the vessel then branches into several small branches covering the inferolateral and posterolateral wall. The distal branches are small with diffuse luminal irregularities.  RCA: Likely moderate caliber vessel that is difficult to engage due to the ostial stent. There is also a significant lip of calcium at the ostium. The ostial stent has severe 670-80% in-stent restenosis. The  mid stent is relatively patent with diffuse 20-30% in-stent restenosis, however after the stent the vessel branches into very small caliber distal RCA the gives rise to a small PDA and PLV as well as a same-sized RV marginal branch that has a focal 95% lesion  After the branch point there appeared to be a focal stenosis of a roughly 70% in the main RCA before the genu. The distal targets of the RCA are relatively small in caliber and is angiographic image, however based on the 2001 angiography the vessel was actually larger in size.  PATIENT DISPOSITION:    The patient was transferred to the PACU holding area in a hemodynamicaly stable, chest pain free condition.  The patient tolerated the procedure well, and there were no complications.  The patient was stable before, during, and after the procedure.  EBL:   < 10 ml  POST-OPERATIVE DIAGNOSIS:    Severe multivessel CAD involving 70-80% in-stent restenosis of the ostial RCA stent, heavily calcified distal left main with 40-50% stenosis also involving 60% ostial LAD and Circumflex lesions, along with proximal to mid heavily calcified LAD stenosis and distal Circumflex/OM 2 stenoses.  Normal LV function with normal EDP.  PLAN OF CARE:  Based on the extent of disease in a diabetic patient, I feel the best course of action is to consult CT surgery to consider possible CABG. The distal LAD is a good target for LIMA, however the distal Circumflex/OM and RCA-PDA may not be the best graft targets.  With the extent of  disease, a true culprit lesion is difficult to pinpoint.  The patient will remain in the hospital the step down level of care tonight. Will continue on nitroglycerin and restart Heparin 8 hours post procedure/sheath pull.  Continue to optimize therapy for blood pressure control.  I discussed the results of the case with the patient's niece per patient request. The note will be for her to the patient's primary cardiologist and  primary physician.  Marykay Lex, M.D., M.S. [facility] [address] [place], [place]  [address]  [phone]  11/28/2012 11:07 AM

## 2012-11-29 DIAGNOSIS — Z01810 Encounter for preprocedural cardiovascular examination: Secondary | ICD-10-CM

## 2012-11-29 DIAGNOSIS — I2510 Atherosclerotic heart disease of native coronary artery without angina pectoris: Secondary | ICD-10-CM

## 2012-11-29 LAB — GLUCOSE, CAPILLARY
Glucose-Capillary: 111 mg/dL — ABNORMAL HIGH (ref 70–99)
Glucose-Capillary: 119 mg/dL — ABNORMAL HIGH (ref 70–99)
Glucose-Capillary: 108 mg/dL — ABNORMAL HIGH (ref 70–99)

## 2012-11-29 LAB — BASIC METABOLIC PANEL
BUN: 12 mg/dL (ref 6–23)
CO2: 24 mEq/L (ref 19–32)
Chloride: 102 mEq/L (ref 96–112)
GFR calc non Af Amer: 65 mL/min — ABNORMAL LOW (ref >90)
Glucose, Bld: 131 mg/dL — ABNORMAL HIGH (ref 70–99)
Potassium: 4.7 mEq/L (ref 3.5–5.1)
Sodium: 136 mEq/L (ref 135–145)

## 2012-11-29 LAB — CBC
MCV: 80.7 fL (ref 78.0–100.0)
Platelets: 239 10*3/uL (ref 150–400)
RBC: 3.53 MIL/uL — ABNORMAL LOW (ref 3.87–5.11)
RDW: 15.4 % (ref 11.5–15.5)
WBC: 6.7 10*3/uL (ref 4.0–10.5)

## 2012-11-29 LAB — HEPARIN LEVEL (UNFRACTIONATED): Heparin Unfractionated: 0.32 IU/mL (ref 0.30–0.70)

## 2012-11-29 MED ORDER — BUDESONIDE-FORMOTEROL FUMARATE 160-4.5 MCG/ACT IN AERO
2.0000 | INHALATION_SPRAY | Freq: Two times a day (BID) | RESPIRATORY_TRACT | Status: DC
  Administered 2012-11-29 – 2012-12-02 (×6): 2 via RESPIRATORY_TRACT
  Filled 2012-11-29 (×2): qty 6

## 2012-11-29 MED ORDER — ISOSORBIDE MONONITRATE ER 60 MG PO TB24
60.0000 mg | ORAL_TABLET | Freq: Every day | ORAL | Status: DC
  Administered 2012-11-29 – 2012-12-03 (×5): 60 mg via ORAL
  Filled 2012-11-29 (×7): qty 1

## 2012-11-29 MED ORDER — NITROGLYCERIN IN D5W 200-5 MCG/ML-% IV SOLN
10.0000 ug/min | INTRAVENOUS | Status: AC
  Filled 2012-11-29: qty 250

## 2012-11-29 NOTE — Progress Notes (Signed)
Subjective: No complaints  Objective: Vital signs in last 24 hours: Temp:  [98 F (36.7 C)-99.1 F (37.3 C)] 98.9 F (37.2 C) (01/25 0713) Pulse Rate:  [55-67] 66  (01/25 0713) Resp:  [12-19] 13  (01/25 0713) BP: (94-139)/(38-99) 126/52 mmHg (01/25 0713) SpO2:  [97 %-100 %] 97 % (01/25 0713) Weight:  [62.1 kg (136 lb 14.5 oz)] 62.1 kg (136 lb 14.5 oz) (01/25 0500) Weight change: 0.2 kg (7.1 oz) Last BM Date: 11/27/12 Intake/Output from previous day: +339 01/24 0701 - 01/25 0700 In: 639 [I.V.:639] Out: 300 [Urine:300] Intake/Output this shift:    PE: General:alert and oriented, no complaints, no chest pain Heart:S1S2 RRR Lungs:clear though somewhat diminished Abd:+ BS, soft, non tneder Ext:Lt. Groin cath site stable without hematoma.    Lab Results:  Basename 11/29/12 0500 11/28/12 0239  WBC 6.7 9.9  HGB 9.2* 10.5*  HCT 28.5* 32.7*  PLT 239 317   BMET  Basename 11/28/12 0920 11/27/12 1716  NA 137 138  K 4.3 4.4  CL 101 98  CO2 24 28  GLUCOSE 125* 178*  BUN 17 21  CREATININE 0.88 1.06  CALCIUM 9.2 9.6    Basename 11/28/12 0920 11/28/12 0239  TROPONINI 2.15* 1.91*    Lab Results  Component Value Date   CHOL 160 11/28/2012   HDL 49 11/28/2012   LDLCALC 99 11/28/2012   TRIG 59 11/28/2012   CHOLHDL 3.3 11/28/2012   Lab Results  Component Value Date   HGBA1C 6.8* 11/28/2012     Lab Results  Component Value Date   TSH 2.654 11/28/2012    Hepatic Function Panel  Basename 11/28/12 0920  PROT 6.3  ALBUMIN 3.3*  AST 24  ALT 8  ALKPHOS 50  BILITOT 0.3  BILIDIR <0.1  IBILI NOT CALCULATED    Basename 11/28/12 0239  CHOL 160   Studies/Results: Dg Chest 2 View  11/27/2012  *RADIOLOGY REPORT*  Clinical Data: Chest pain.  CHEST - 2 VIEW  Comparison: Single of the chest 01/22/2011 and CT chest 2020-01-1308.  Findings: There is cardiomegaly without pulmonary edema.  The aorta is markedly ectatic.  Lungs are clear.  No pneumothorax or pleural effusion.   IMPRESSION: Cardiomegaly without acute disease.   Original Report Authenticated By: Holley Dexter, M.D.    Cardiac Cath 11/28/12: Severe multivessel CAD involving 70-80% in-stent restenosis of the ostial RCA stent, heavily calcified distal left main with 40-50% stenosis also involving 60% ostial LAD and Circumflex lesions, along with proximal to mid heavily calcified LAD stenosis and distal Circumflex/OM 2 stenoses.  Normal LV function with normal EDP.   Medications: I have reviewed the patient's current medications.    [name] aspirin EC  81 mg Oral Daily  . atorvastatin  40 mg Oral q1800  . insulin aspart  0-15 Units Subcutaneous TID WC  . irbesartan  150 mg Oral Daily  . metoprolol tartrate  100 mg Oral BID  . sodium chloride  3 mL Intravenous Q12H   Assessment/Plan: Principal Problem:  *NSTEMI (non-ST elevated myocardial infarction) Active Problems:  PAD (peripheral artery disease)  CAD (coronary artery disease): 1993 PTCA to circumflex.  2002 2 BMS stents to ostial, mid RCA now with severe multivessel CAD for possible CABG  DM2 (diabetes mellitus, type 2)  HTN (hypertension)  HLD (hyperlipidemia)  PLAN:  Per Dr. Elissa Hefty note, CT surgery consulted for possible CABG. Continue IV NTG and IV Heparin. Will transfer to tele.  Troponin still climbing yesterday will recheck with BMP.  LOS:  2 days   INGOLD,LAURA R 11/29/2012, 8:10 AM  I have seen and evaluated the patient this AM along with Nada Boozer, NP. I agree with her findings, examination as well as impression recommendations.  Troponin trending down & no further angina. Unfortunately no c/s note yet from CVTS re: possible CABG.  Will contact on-call surgeon. Continue IV Heparin to complete 72 hrs, convert to PO nitrate.  Monitor BP once off NTG gtt.  On BB, ARB & statin  ASA  BP/HR stable  DM - CBG well controlled.  Transfer to Tele bed today & await word from CT Sgx.  Marykay Lex, M.D., M.S. [facility] [address] [place], [place]  [address]  [phone] Pager # [phone] 11/29/2012 10:36 AM

## 2012-11-29 NOTE — Progress Notes (Signed)
ANTICOAGULATION CONSULT NOTE - Follow Up Consult  Pharmacy Consult for Heparin Indication: CAD awaiting TCTS evaluation for OHS  Allergies  Allergen Reactions  . Iron Hives  . Vicodin (Hydrocodone-Acetaminophen)   . Vioxx (Rofecoxib)     Patient Measurements: Height: 5\' 2"  (157.5 cm) Weight: 136 lb 14.5 oz (62.1 kg) IBW/kg (Calculated) : 50.1  Heparin Dosing Weight: 62.1 kg  Vital Signs: Temp: 97.9 F (36.6 C) (01/25 1114) Temp src: Oral (01/25 0713) BP: 126/52 mmHg (01/25 0713) Pulse Rate: 58  (01/25 1114)  Labs:  [name] 11/29/12 1411 11/29/12 0821 11/29/12 0820 11/29/12 0500 11/28/12 0920 11/28/12 0239 11/27/12 1716  HGB -- -- -- 9.2* -- 10.5* --  HCT -- -- -- 28.5* -- 32.7* 36.4  PLT -- -- -- 239 -- 317 303  APTT -- -- -- -- -- -- --  LABPROT -- -- -- -- 13.9 -- --  INR -- -- -- -- 1.08 -- --  HEPARINUNFRC 0.32 -- -- 0.18* -- 0.58 --  CREATININE -- -- 0.86 -- 0.88 -- 1.06  CKTOTAL -- -- -- -- -- -- --  CKMB -- -- -- -- -- -- --  TROPONINI -- 1.34* -- -- 2.15* 1.91* --    Estimated Creatinine Clearance: 50.5 ml/min (by C-G formula based on Cr of 0.86).   Medications:  Scheduled:    . [COMPLETED] albuterol  2.5 mg Nebulization Once  . aspirin EC  81 mg Oral Daily  . atorvastatin  40 mg Oral q1800  . insulin aspart  0-15 Units Subcutaneous TID WC  . irbesartan  150 mg Oral Daily  . isosorbide mononitrate  60 mg Oral Daily  . metoprolol tartrate  100 mg Oral BID  . sodium chloride  3 mL Intravenous Q12H   Infusions:    . [EXPIRED] sodium chloride Stopped (11/28/12 1730)  . heparin 950 Units/hr (11/29/12 0744)  . nitroGLYCERIN 30 mcg/min (11/29/12 1045)  . [DISCONTINUED] nitroGLYCERIN 40 mcg/min (11/29/12 0000)    Assessment: 72 yo F presented to ED 1/23with CP.  Now s/p cath 1/24 showing multi-vessel disease.  Awaiting TCTS eval for OHS.  Continues on heparin at 950 units/hr.  Heparin level is at low end of therapeutic goal at this rate.  No bleeding  noted.  Goal of Therapy:  Heparin level 0.3-0.7 units/ml   Plan:  Increase heparin to 1000 units/hr to maintain therapeutic levels. Continue daily heparin level and CBC. Follow up plans for OHS.  [employer], Pharm.D., BCPS Clinical Pharmacist Pager [phone] 11/29/2012 3:44 PM

## 2012-11-29 NOTE — Progress Notes (Signed)
ANTICOAGULATION CONSULT NOTE - Follow Up Consult  Pharmacy Consult for Heparin Indication: NSTEMI  Allergies  Allergen Reactions  . Iron Hives  . Vicodin (Hydrocodone-Acetaminophen)   . Vioxx (Rofecoxib)     Patient Measurements: Height: 5\' 2"  (157.5 cm) Weight: 136 lb 14.5 oz (62.1 kg) IBW/kg (Calculated) : 50.1  Heparin Dosing Weight:   Vital Signs: Temp: 98.9 F (37.2 C) (01/25 0713) Temp src: Oral (01/25 0713) BP: 126/52 mmHg (01/25 0713) Pulse Rate: 66  (01/25 0713)  Labs:  Basename 11/29/12 0500 11/28/12 0920 11/28/12 0239 11/27/12 2123 11/27/12 1716  HGB 9.2* -- 10.5* -- --  HCT 28.5* -- 32.7* -- 36.4  PLT 239 -- 317 -- 303  APTT -- -- -- -- --  LABPROT -- 13.9 -- -- --  INR -- 1.08 -- -- --  HEPARINUNFRC 0.18* -- 0.58 -- --  CREATININE -- 0.88 -- -- 1.06  CKTOTAL -- -- -- -- --  CKMB -- -- -- -- --  TROPONINI -- 2.15* 1.91* 1.89* --    Estimated Creatinine Clearance: 49.3 ml/min (by C-G formula based on Cr of 0.88).  Assessment: 72 y.o Female with CAD s/p cath for heparin  Goal of Therapy:  Heparin level 0.3-0.7 units/ml Monitor platelets by anticoagulation protocol: Yes   Plan:  Increase Heparin 950 units/hr Check heparin level in 6 hours.  Eddie Candle 11/29/2012,7:22 AM

## 2012-11-29 NOTE — Consult Note (Signed)
[address]            [place] [address]          [phone]       LOLLIE GUNNER [facility] Medical Record #[id number] Date of Birth: [birth year]  No ref. provider found GREEN, [name], MD  Chief Complaint:    Chief Complaint  Patient presents with  . Chest Pain    History of Present Illness:     72 year old African female diabetic smoker presents with unstable angina and positive cardiac enzymes with fairly well preserved LV function. She has past history of CAD and previous stents placed to the circumflex in 1993 and in the right coronary ostium and 2001. She is status post bilateral carotid endarterectomy by Dr. Darrick Penna and has moderate recurrent right carotid artery disease with a 60-80% stenosis-asymptomatic.  Cardiac catheterization demonstrates left main disease with heavy calcification of the proximal LAD, diffusely diseased circumflex with multiple stenosis approximately 80%, right coronary with proximal 80% ostial stenosis  Patient placed on IV heparin and nitroglycerin with resolution of chest pain, no arrhythmias or hemodynamic instability coronary artery bypass grafting recommended based on her history of diabetes and three-vessel anatomy including a left main disease   Current Activity/ Functional Status: Lives independently   Past Medical History  Diagnosis Date  . Arthritis   . Atrial fibrillation 02/21/2011  . Hypertension   . Hyperlipidemia   . Myocardial infarction   . Diabetes mellitus age 89  . CAD (coronary artery disease)     prior coronary stenting  . Stroke 2001  . Peripheral vascular disease   . Carotid artery disease 2001    s/p Bilateral CEA, after CVA    Past Surgical History  Procedure Date  . Carotid endarterectomy 2001    Bilateral  . Cholecystectomy   . Kidney stone surgery     Removal  . Back surgery     three previous surgeries  . Angioplasty 1984 and 1982  . Abdominal hysterectomy 1975  .  Percutaneous coronary stent intervention (pci-s) 2002    2 BMS in ostial/Prox & mid RCA (mid 2.5 mm 20x  mm, & 2.75 mm x  8 mm ostial)    History  Smoking status  . Current Every Day Smoker -- 0.3 packs/day for 57 years  . Types: Cigarettes  Smokeless tobacco  . Never Used    Comment: pt states she is working on it but husband just passed    History  Alcohol Use No    History   Social History  . Marital Status: Married    Spouse Name: N/A    Number of Children: N/A  . Years of Education: N/A   Occupational History  . Not on file.   Social History Main Topics  . Smoking status: Current Every Day Smoker -- 0.3 packs/day for 57 years    Types: Cigarettes  . Smokeless tobacco: Never Used     Comment: pt states she is working on it but husband just passed  . Alcohol Use: No  . Drug Use: No  . Sexually Active: Not on file   Other Topics Concern  . Not on file   Social History Narrative  . No narrative on file    Allergies  Allergen Reactions  . Iron Hives  . Vicodin (Hydrocodone-Acetaminophen)   . Vioxx (Rofecoxib)  Current Facility-Administered Medications  Medication Dose Route Frequency Provider Last Rate Last Dose  . 0.9 %  sodium chloride infusion  250 mL Intravenous PRN Marykay Lex, MD 10 mL/hr at 11/28/12 1730 250 mL at 11/28/12 1730  . acetaminophen (TYLENOL) tablet 650 mg  650 mg Oral Q4H PRN Wilburt Finlay, PA      . aspirin EC tablet 81 mg  81 mg Oral Daily Wilburt Finlay, PA   81 mg at 11/29/12 0918  . atorvastatin (LIPITOR) tablet 40 mg  40 mg Oral q1800 Wilburt Finlay, PA   40 mg at 11/28/12 1846  . feeding supplement (GLUCERNA SHAKE) liquid 237 mL  237 mL Oral PRN Tonye Becket, RD      . heparin ADULT infusion 100 units/mL (25000 units/250 mL)  1,000 Units/hr Intravenous Continuous Judie Bonus Hammons, PHARMD 9.5 mL/hr at 11/29/12 0744 950 Units/hr at 11/29/12 0744  . insulin aspart (novoLOG) injection 0-15 Units  0-15 Units Subcutaneous TID  WC Wilburt Finlay, PA   2 Units at 11/29/12 0758  . irbesartan (AVAPRO) tablet 150 mg  150 mg Oral Daily Wilburt Finlay, PA   150 mg at 11/29/12 8413  . isosorbide mononitrate (IMDUR) 24 hr tablet 60 mg  60 mg Oral Daily Marykay Lex, MD      . metoprolol (LOPRESSOR) tablet 100 mg  100 mg Oral BID Wilburt Finlay, PA   100 mg at 11/29/12 2440  . nitroGLYCERIN 0.2 mg/mL in dextrose 5 % infusion  10-200 mcg/min Intravenous Titrated Marykay Lex, MD 9 mL/hr at 11/29/12 1045 30 mcg/min at 11/29/12 1045  . ondansetron (ZOFRAN) injection 4 mg  4 mg Intravenous Q6H PRN Wilburt Finlay, PA      . sodium chloride 0.9 % injection 3 mL  3 mL Intravenous Q12H Marykay Lex, MD   3 mL at 11/29/12 0920  . sodium chloride 0.9 % injection 3 mL  3 mL Intravenous PRN Marykay Lex, MD         Family History  Problem Relation Age of Onset  . Other Mother     CVA  . Diabetes Mother   . Heart disease Mother     Heart disease before age 94  . Heart attack Mother   . Heart disease Father     Heart Disease before age 71  . Heart attack Father   . Heart disease Sister     Amputation  . Cancer Sister   . Diabetes Sister   . Heart disease Brother     Heart disease before age 91  . Other Brother     CVA  . Diabetes Brother   . Heart disease Brother     Amputation  . Heart disease Brother   . Heart disease Brother   . Heart disease Sister   . Cancer Sister   . Heart disease Sister   . Cancer Sister   . Diabetes Other   . Cancer Other     breast  . Heart disease Other     cad  . Other Other     cva  . Diabetes Daughter      Review of Systems:     Cardiac Review of Systems: Y or N  Chest Pain [  y  ]  Resting SOB [ n  ] Exertional SOB  Kendra Todd  ]  Pollyann Kennedy Kendra Todd  ]   Pedal Edema Kendra Todd   ]    Palpitations Kendra Todd ] Syncope  Kendra Todd  ]  Presyncope [ n  ]  General Review of Systems: [Y] = yes [  ]=no Constitional: recent weight change [  ]; anorexia [  ]; fatigue [  ]; nausea [  ]; night sweats [  ]; fever [  ]; or  chills [  ];                                                                                                                                          Dental: poor dentition[  ]; Last Dentist visit 1yr  Eye : blurred vision [  ]; diplopia [   ]; vision changes [  ];  Amaurosis fugax[  ]; Resp: cough [  ];  wheezing[  ];  hemoptysis[  ]; shortness of breath[  ]; paroxysmal nocturnal dyspnea[  ]; dyspnea on exertion[  ]; or orthopnea[  ];  GI:  gallstones[  ], vomiting[  ];  dysphagia[  ]; melena[  ];  hematochezia [  ]; heartburn[  ];   Hx of  Colonoscopy[  ]; GU: kidney stones [  ]; hematuria[  ];   dysuria [  ];  nocturia[  ];  history of     obstruction [  ];                 Skin: rash, swelling[  ];, hair loss[  ];  peripheral edema[  ];  or itching[  ]; Musculosketetal: myalgias[  ];  joint swelling[  ];  joint erythema[  ];  joint pain[  ];  back pain[  ];  Heme/Lymph: bruising[  ];  bleeding[  ];  anemia[  ];  Neuro: TIA[  ];  headaches[  ];  stroke[  ];  vertigo[  ];  seizures[  ];   paresthesias[  ];  difficulty walking[  ];  Psych:depression[  ]; anxiety[  ];  Endocrine: diabetes[  ];  thyroid dysfunction[  ];  Immunizations: Flu [  ]; Pneumococcal[  ];  Other:  Physical Exam: BP 126/52  Pulse 58  Temp 97.9 F (36.6 C) (Oral)  Resp 16  Ht 5\' 2"  (1.575 m)  Wt 136 lb 14.5 oz (62.1 kg)  BMI 25.04 kg/m2  SpO2 100%  Exam General appearance middle-aged African female in the cardiac step down unit no acute distress HEENT normocephalic pupils equal dentition good Neck bilateral carotid endarterectomy scars no mass or JVD, bilateral carotid bruit present Thorax scattered rhonchi no deformity or tenderness Abdomen soft without pulsatile mass Extremities no cyanosis or edema mild clubbing no tendernes, no hematoma in left groin from cardiac catheterization Vascular no significant venous insufficiency Cardiac sinus rhythm without murmur or gallop Neurologic no focal motor  deficit  Diagnostic Studies & Laboratory data:   Cardiac catheterization reviewed, 2-D echo and CT of chest pending for history of smoking with prior small pulmonary nodules  Recent Radiology Findings:   Dg Chest  2 View  11/27/2012  *RADIOLOGY REPORT*  Clinical Data: Chest pain.  CHEST - 2 VIEW  Comparison: Single of the chest 01/22/2011 and CT chest 05-16-2008.  Findings: There is cardiomegaly without pulmonary edema.  The aorta is markedly ectatic.  Lungs are clear.  No pneumothorax or pleural effusion.  IMPRESSION: Cardiomegaly without acute disease.   Original Report Authenticated By: Holley Dexter, M.D.       Recent Lab Findings: Lab Results  Component Value Date   WBC 6.7 11/29/2012   HGB 9.2* 11/29/2012   HCT 28.5* 11/29/2012   PLT 239 11/29/2012   GLUCOSE 131* 11/29/2012   CHOL 160 11/28/2012   TRIG 59 11/28/2012   HDL 49 11/28/2012   LDLCALC 99 11/28/2012   ALT 8 11/28/2012   AST 24 11/28/2012   NA 136 11/29/2012   K 4.7 11/29/2012   CL 102 11/29/2012   CREATININE 0.86 11/29/2012   BUN 12 11/29/2012   CO2 24 11/29/2012   TSH 2.654 11/28/2012   INR 1.08 11/28/2012   HGBA1C 6.8* 11/28/2012      Assessment / Plan:      Diabetic smoker with unstable angina, non-ST elevation MI and significant left main and multivessel coronary disease. Best therapy would be surgical revitalization. We'll plan surgery for later this week January 30 with bypass grafts to LAD circumflex diagonal and RCA.  Patient will need PFTs and pulmonary tuneup prior to surgery. Carotid disease appears to be stable by comparing recent Doppler studies to current study with moderate right recurrent carotid disease-asymptomatic

## 2012-11-29 NOTE — Progress Notes (Signed)
  Echocardiogram 2D Echocardiogram has been performed.  Kendra Todd FRANCES 11/29/2012, 4:44 PM

## 2012-11-29 NOTE — Progress Notes (Addendum)
Pre-op Cardiac Surgery  Carotid Findings:  Right= 60-79% ICA stenosis. Left= <40% ICA stenosis.  Antegrade vertebral flow. S/P Bilateral carotid endarterectomy 2001.     Upper Extremity Right Left  Brachial Pressures 150T 160T  Radial Waveforms T T  Ulnar Waveforms   T T  Palmar Arch (Allen's Test) Doppler signal remains normal with radial compression and diminish 50% with ulnar compression. WNL   Findings:      Lower  Extremity Right Left  Dorsalis Pedis 139B 147B  Anterior Tibial    Posterior Tibial 137B 155B  Ankle/Brachial Indices 0.87 0.97    Farrel Demark, RDMS, RVT 11/29/2012

## 2012-11-30 ENCOUNTER — Inpatient Hospital Stay (HOSPITAL_COMMUNITY): Payer: Medicare Other

## 2012-11-30 LAB — BASIC METABOLIC PANEL
CO2: 26 mEq/L (ref 19–32)
Calcium: 9.2 mg/dL (ref 8.4–10.5)
Creatinine, Ser: 0.82 mg/dL (ref 0.50–1.10)
Glucose, Bld: 129 mg/dL — ABNORMAL HIGH (ref 70–99)
Sodium: 136 mEq/L (ref 135–145)

## 2012-11-30 LAB — CBC
Hemoglobin: 9.9 g/dL — ABNORMAL LOW (ref 12.0–15.0)
MCH: 25.8 pg — ABNORMAL LOW (ref 26.0–34.0)
Platelets: 239 10*3/uL (ref 150–400)
RBC: 3.84 MIL/uL — ABNORMAL LOW (ref 3.87–5.11)

## 2012-11-30 LAB — URINALYSIS, ROUTINE W REFLEX MICROSCOPIC
Bilirubin Urine: NEGATIVE
Glucose, UA: 500 mg/dL — AB
Hgb urine dipstick: NEGATIVE
Ketones, ur: NEGATIVE mg/dL
Leukocytes, UA: NEGATIVE
Nitrite: NEGATIVE
Protein, ur: NEGATIVE mg/dL
Specific Gravity, Urine: >1.046 — ABNORMAL HIGH (ref 1.005–1.030)
Urobilinogen, UA: 1 mg/dL (ref 0.0–1.0)
pH: 7 (ref 5.0–8.0)

## 2012-11-30 LAB — GLUCOSE, CAPILLARY
Glucose-Capillary: 142 mg/dL — ABNORMAL HIGH (ref 70–99)
Glucose-Capillary: 94 mg/dL (ref 70–99)
Glucose-Capillary: 132 mg/dL — ABNORMAL HIGH (ref 70–99)

## 2012-11-30 LAB — SURGICAL PCR SCREEN
MRSA, PCR: NEGATIVE
Staphylococcus aureus: NEGATIVE

## 2012-11-30 MED ORDER — IOHEXOL 300 MG/ML  SOLN
80.0000 mL | Freq: Once | INTRAMUSCULAR | Status: AC | PRN
  Administered 2012-11-30: 80 mL via INTRAVENOUS

## 2012-11-30 NOTE — Progress Notes (Signed)
2 Days Post-Op Procedure(s) (LRB): LEFT HEART CATHETERIZATION WITH CORONARY ANGIOGRAM (N/A) Subjective Non-ST elevation MI with left main stenosis 2-vessel disease Stable on IV heparin nitroglycerin 2-D echocardiogram demonstrates moderate LV dysfunction however patient has severe tricuspid regurgitation and probable severe pulmonary hypertension Patient will need a right heart cath prior to bypass surgery later in the week CT scan of the chest shows no suspicious pulmonary nodules despite long history smoking, significant atherosclerotic disease of the thoracic aorta and arch without significant stenosis PFTs pending  Objective: Vital signs in last 24 hours: Temp:  [97.5 F (36.4 C)-98.8 F (37.1 C)] 98.8 F (37.1 C) (01/26 0629) Pulse Rate:  [64-73] 73  (01/26 0629) Cardiac Rhythm:  [-] Normal sinus rhythm (01/26 0900) Resp:  [16-20] 20  (01/26 0629) BP: (136-165)/(59-77) 141/72 mmHg (01/26 0629) SpO2:  [98 %-100 %] 100 % (01/26 0900) Weight:  [150 lb (68.04 kg)] 150 lb (68.04 kg) (01/26 0500)  Hemodynamic parameters for last 24 hours:   sinus rhythm  Intake/Output from previous day: 01/25 0701 - 01/26 0700 In: 723 [P.O.:720; I.V.:3] Out: 300 [Urine:300] Intake/Output this shift:      Lab Results:  Basename 11/30/12 0715 11/29/12 0500  WBC 7.6 6.7  HGB 9.9* 9.2*  HCT 31.0* 28.5*  PLT 239 239   BMET:  Basename 11/30/12 0715 11/29/12 0820  NA 136 136  K 4.5 4.7  CL 101 102  CO2 26 24  GLUCOSE 129* 131*  BUN 11 12  CREATININE 0.82 0.86  CALCIUM 9.2 9.3    PT/INR:  Basename 11/28/12 0920  LABPROT 13.9  INR 1.08   ABG    Component Value Date/Time   TCO2 29 03/30/2011 0627   CBG (last 3)   Basename 11/30/12 1117 11/30/12 0745 11/29/12 2113  GLUCAP 135* 142* 108*    Assessment/Plan: S/P Procedure(s) (LRB): LEFT HEART CATHETERIZATION WITH CORONARY ANGIOGRAM (N/A) Severe ostial left main Severe atherosclerotic basher disease Severe pulmonary  hypertension and TR with intermittent atrial fibrillation Right heart cath needed prior to surgery  LOS: 3 days    VAN TRIGT III,Darci Lykins 11/30/2012

## 2012-11-30 NOTE — Progress Notes (Signed)
[facility] Progress Note  Subjective:  No recurrent chest pain  Objective:   Vital Signs in the last 24 hours: Temp:  [97.5 F (36.4 C)-98.8 F (37.1 C)] 98.8 F (37.1 C) (01/26 0629) Pulse Rate:  [58-73] 73  (01/26 0629) Resp:  [16-20] 20  (01/26 0629) BP: (136-165)/(59-77) 141/72 mmHg (01/26 0629) SpO2:  [98 %-100 %] 100 % (01/26 0900) Weight:  [68.04 kg (150 lb)] 68.04 kg (150 lb) (01/26 0500)  Intake/Output from previous day: 01/25 0701 - 01/26 0700 In: 723 [P.O.:720; I.V.:3] Out: 300 [Urine:300]  Scheduled:   . aspirin EC  81 mg Oral Daily  . atorvastatin  40 mg Oral q1800  . budesonide-formoterol  2 puff Inhalation BID  . insulin aspart  0-15 Units Subcutaneous TID WC  . irbesartan  150 mg Oral Daily  . isosorbide mononitrate  60 mg Oral Daily  . metoprolol tartrate  100 mg Oral BID  . sodium chloride  3 mL Intravenous Q12H      . heparin 950 Units/hr (11/29/12 0744)   Physical Exam:   General appearance: alert, cooperative and no distress Neck: no JVD and supple, symmetrical, trachea midline Lungs: clear to auscultation bilaterally Heart: regular rate and rhythm and 1 - 2/6 SEM Abdomen: soft, non-tender; bowel sounds normal; no masses,  no organomegaly Extremities: no edema, redness or tenderness in the calves or thighs   Rate: 70  Rhythm: normal sinus rhythm  Lab Results:    Basename 11/30/12 0715 11/29/12 0820  NA 136 136  K 4.5 4.7  CL 101 102  CO2 26 24  GLUCOSE 129* 131*  BUN 11 12  CREATININE 0.82 0.86   CBC    Component Value Date/Time   WBC 7.6 11/30/2012 0715   RBC 3.84* 11/30/2012 0715   HGB 9.9* 11/30/2012 0715   HCT 31.0* 11/30/2012 0715   PLT 239 11/30/2012 0715   MCV 80.7 11/30/2012 0715   MCH 25.8* 11/30/2012 0715   MCHC 31.9 11/30/2012 0715   RDW 15.3 11/30/2012 0715   LYMPHSABS 1.2 01/22/2011 1346   MONOABS 1.1* 01/22/2011 1346   EOSABS 0.0 01/22/2011 1346   BASOSABS 0.0 01/22/2011 1346      Basename 11/29/12 0821 11/28/12 0920  TROPONINI 1.34* 2.15*   Hepatic Function Panel  Basename 11/28/12 0920  PROT 6.3  ALBUMIN 3.3*  AST 24  ALT 8  ALKPHOS 50  BILITOT 0.3  BILIDIR <0.1  IBILI NOT CALCULATED    Basename 11/28/12 0920  INR 1.08    Lipid Panel     Component Value Date/Time   CHOL 160 11/28/2012 0239   TRIG 59 11/28/2012 0239   HDL 49 11/28/2012 0239   CHOLHDL 3.3 11/28/2012 0239   VLDL 12 11/28/2012 0239   LDLCALC 99 11/28/2012 0239     Imaging:  Ct Chest W Contrast  11/30/2012  *RADIOLOGY REPORT*  Clinical Data: Coronary artery disease.  The patient is scheduled for CABG.  Apparent history of pulmonary nodule.  The patient is a smoker.  CT CHEST WITH CONTRAST  Technique:  Multidetector CT imaging of the chest was performed following the standard protocol during bolus administration of intravenous contrast.  Contrast:  80 ml Omnipaque-300 IV  Comparison: Chest x-ray dated 11/27/2012  Findings: There are no pulmonary nodules or infiltrates.  Some small paratracheal and pretracheal lymph nodes identified in the mediastinum which are not enlarged.  Maximal short axis measurements of lymph nodes are 9-10 mm.  Significant atherosclerotic  disease is identified of the aorta. Atherosclerotic plaque is present at the level of the aortic arch. The visualized proximal great vessels show bovine anatomy.  The proximal left subclavian artery shows approximately 50% stenosis. Primary level of atherosclerotic disease involves the descending thoracic aorta and visualized upper to mid abdominal aorta.  The descending thoracic aorta is mildly dilated, measuring up to 3.4 cm in maximal diameter.  Diffuse and irregular partially calcified plaque, ulcerated noncalcified plaque and irregular mural thrombus is identified.  The proximal abdominal aorta measures 3 cm in transverse diameter at the level of the celiac axis.  The visualized upper abdominal aorta is well opacified and  demonstrates a large amount of irregular ulcerated soft plaque/thrombus and dilated partially calcified plaque. Significant atherosclerosis is seen at the origins of both celiac axis and superior mesenteric artery with a roughly 50 - 70% narrowing at the origin of the celiac axis and a significant 70 - 90% stenosis at the origin of the superior mesenteric artery. Bilateral renal arteries are also identified with heavily calcified plaque at the origin of the right renal artery causing probable moderate range stenosis and partially calcified plaque at the origin of the left renal artery causing likely mild stenosis.  The heart is enlarged.  Diffuse, calcified atherosclerotic plaque is identified in the coronary artery tree.  The bony thorax shows diffuse osteopenia.  No compression fractures or focal bony lesions are identified.  IMPRESSION:  1.  No evidence of pulmonary nodule or lymphadenopathy in the chest. 2.  Significant atherosclerosis of the descending thoracic and visualized abdominal aorta as above.  There is a large amount of ulcerated soft plaque and component of mural thrombus, particularly in the lower descending thoracic and upper abdominal aorta.  This may potentially predispose the patient for future embolic complication. The descending thoracic aorta is mildly dilated, measuring 3.4 cm in maximum diameter. 3. Significant atherosclerosis of the visceral arteries identified in the upper abdomen.  Moderate origin stenosis of the celiac axis and high-grade stenosis at the origin of the superior mesenteric artery noted.  Both renal arteries show atherosclerotic disease at the origins without critical stenosis.   Original Report Authenticated By: Irish Lack, M.D.     2 d E cho Study Conclusions  - Left ventricle: The cavity size was normal. There was moderate asymmetric hypertrophy. Systolic function was normal. The estimated ejection fraction was in the range of 55% to 60%. Mild inferobasilar  hypokinesis cannot be excluded. Features are consistent with a pseudonormal left ventricular filling pattern, with concomitant abnormal relaxation and increased filling pressure (grade 2 diastolic dysfunction). Doppler parameters are consistent with elevated mean left atrial filling pressure. - Aortic valve: Trileaflet; mildly thickened, mildly calcified leaflets most prominently affecting the noncoronary cusp. - Mitral valve: Mildly to moderately calcified annulus. Mildly thickened leaflets . Mild to moderate regurgitation. - Left atrium: The atrium was mildly dilated. - Right ventricle: Systolic function was normal. Systolic pressure was increased. - Right atrium: The atrium was mildly dilated. - Atrial septum: No defect or patent foramen ovale was identified. - Tricuspid valve: Severe regurgitation. - Pulmonary arteries: PA peak pressure: 61mm Hg (S).   Assessment/Plan:   Principal Problem:  *NSTEMI (non-ST elevated myocardial infarction) Active Problems:  PAD (peripheral artery disease)  CAD (coronary artery disease): 1993 PTCA to circumflex.  2002 2 BMS stents to ostial, mid RCA now with severe multivessel CAD for possible CABG  DM2 (diabetes mellitus, type 2)  HTN (hypertension)  HLD (hyperlipidemia)   No  recurrent chest pain; s/p NSTEMI.  Findings of CT scan reviewed; diffuse atherosclerosis with possible mural thrombus in descending aorta. PA pressure 61 mmHG on echo with upper septal thickening, mil-mod MR, severe TR. Continue heparin until CABG scheduled for 1/30.    Lennette Bihari, MD, [facility] 11/30/2012, 9:42 AM

## 2012-11-30 NOTE — Progress Notes (Signed)
ANTICOAGULATION CONSULT NOTE - Follow Up Consult  Pharmacy Consult for Heparin Indication: CAD awaiting TCTS evaluation for OHS  Allergies  Allergen Reactions  . Iron Hives  . Vicodin (Hydrocodone-Acetaminophen)   . Vioxx (Rofecoxib)     Patient Measurements: Height: 5\' 2"  (157.5 cm) Weight: 150 lb (68.04 kg) IBW/kg (Calculated) : 50.1  Heparin Dosing Weight: 62.1 kg  Vital Signs: Temp: 98.8 F (37.1 C) (01/26 0629) BP: 141/72 mmHg (01/26 0629) Pulse Rate: 73  (01/26 0629)  Labs:  Kendra Todd 11/30/12 0715 11/29/12 1411 11/29/12 0821 11/29/12 0820 11/29/12 0500 11/28/12 0920 11/28/12 0239  HGB 9.9* -- -- -- 9.2* -- --  HCT 31.0* -- -- -- 28.5* -- 32.7*  PLT 239 -- -- -- 239 -- 317  APTT -- -- -- -- -- -- --  LABPROT -- -- -- -- -- 13.9 --  INR -- -- -- -- -- 1.08 --  HEPARINUNFRC 0.51 0.32 -- -- 0.18* -- --  CREATININE 0.82 -- -- 0.86 -- 0.88 --  CKTOTAL -- -- -- -- -- -- --  CKMB -- -- -- -- -- -- --  TROPONINI -- -- 1.34* -- -- 2.15* 1.91*    Estimated Creatinine Clearance: 55.3 ml/min (by C-G formula based on Cr of 0.82).   Medications:  Scheduled:     . aspirin EC  81 mg Oral Daily  . atorvastatin  40 mg Oral q1800  . budesonide-formoterol  2 puff Inhalation BID  . insulin aspart  0-15 Units Subcutaneous TID WC  . irbesartan  150 mg Oral Daily  . isosorbide mononitrate  60 mg Oral Daily  . metoprolol tartrate  100 mg Oral BID  . sodium chloride  3 mL Intravenous Q12H   Infusions:     . heparin 1,000 Units/hr (11/30/12 1146)  . [EXPIRED] nitroGLYCERIN 30 mcg/min (11/29/12 1045)    Assessment: 72 yo F presented to ED 1/23 with CP.  Now s/p cath 1/24 showing multi-vessel disease.  TCTS tentatively planning OHS for 1/30.  Heparin is therapeutic on 1000 units/hr.  No bleeding noted.  Goal of Therapy:  Heparin level 0.3-0.7 units/ml   Plan:  Continue heparin at 1000 units/hr. Continue daily heparin level and CBC. Follow up plans for OHS.  [place], Pharm.D., BCPS Clinical Pharmacist Pager [phone] 11/30/2012 3:16 PM

## 2012-11-30 NOTE — Progress Notes (Signed)
Respiratory unable to obtain pre-op ABG's,  Order changed to Monday on dayshift while attending physician is in  [place]. Will attempt again Monday 12/01/12 morning.

## 2012-12-01 DIAGNOSIS — Z01810 Encounter for preprocedural cardiovascular examination: Secondary | ICD-10-CM

## 2012-12-01 DIAGNOSIS — M79609 Pain in unspecified limb: Secondary | ICD-10-CM

## 2012-12-01 LAB — BLOOD GAS, ARTERIAL
Acid-Base Excess: 1.4 mmol/L (ref 0.0–2.0)
Bicarbonate: 25.4 mEq/L — ABNORMAL HIGH (ref 20.0–24.0)
FIO2: 0.21 %
O2 Saturation: 98.5 %
TCO2: 26.6 mmol/L (ref 0–100)
pCO2 arterial: 39.7 mmHg (ref 35.0–45.0)
pO2, Arterial: 93.6 mmHg (ref 80.0–100.0)

## 2012-12-01 LAB — GLUCOSE, CAPILLARY
Glucose-Capillary: 116 mg/dL — ABNORMAL HIGH (ref 70–99)
Glucose-Capillary: 139 mg/dL — ABNORMAL HIGH (ref 70–99)

## 2012-12-01 LAB — CBC
HCT: 29.7 % — ABNORMAL LOW (ref 36.0–46.0)
MCH: 25.7 pg — ABNORMAL LOW (ref 26.0–34.0)
MCHC: 32.3 g/dL (ref 30.0–36.0)
MCV: 79.6 fL (ref 78.0–100.0)
RDW: 15.2 % (ref 11.5–15.5)

## 2012-12-01 MED ORDER — SODIUM CHLORIDE 0.9 % IV SOLN
INTRAVENOUS | Status: DC
  Administered 2012-12-02: 10 mL/h via INTRAVENOUS

## 2012-12-01 MED ORDER — ASPIRIN 81 MG PO CHEW
324.0000 mg | CHEWABLE_TABLET | ORAL | Status: AC
  Administered 2012-12-02: 324 mg via ORAL
  Filled 2012-12-01: qty 4

## 2012-12-01 MED ORDER — SODIUM CHLORIDE 0.9 % IJ SOLN: 3.0000 mL | INTRAMUSCULAR | Status: DC | PRN

## 2012-12-01 MED ORDER — SODIUM CHLORIDE 0.9 % IJ SOLN: 3.0000 mL | Freq: Two times a day (BID) | INTRAMUSCULAR | Status: DC

## 2012-12-01 MED ORDER — SODIUM CHLORIDE 0.9 % IV SOLN: 250.0000 mL | INTRAVENOUS | Status: DC | PRN

## 2012-12-01 MED ORDER — IRBESARTAN 150 MG PO TABS
150.0000 mg | ORAL_TABLET | Freq: Two times a day (BID) | ORAL | Status: DC
  Administered 2012-12-01 – 2012-12-03 (×5): 150 mg via ORAL
  Filled 2012-12-01 (×7): qty 1

## 2012-12-01 NOTE — Progress Notes (Signed)
Pt BP 190/80. Pt does not c/o of any pain. Kendra Todd notified and new orders received. Will continue to monitor. [name]

## 2012-12-01 NOTE — Progress Notes (Signed)
VASCULAR LAB PRELIMINARY  PRELIMINARY  PRELIMINARY  PRELIMINARY  Bilateral lower extremity Dopplers completed.    Preliminary report:  There is no DVT or SVT noted in the bilateral lower extremities.  [place] Mckeough, RVT 12/01/2012, 11:24 AM

## 2012-12-01 NOTE — Progress Notes (Signed)
ANTICOAGULATION CONSULT NOTE - Follow Up Consult  Pharmacy Consult for Heparin Indication: CAD awaiting TCTS evaluation for OHS  Allergies  Allergen Reactions  . Iron Hives  . Vicodin (Hydrocodone-Acetaminophen)   . Vioxx (Rofecoxib)     Patient Measurements: Height: 5\' 2"  (157.5 cm) Weight: 149 lb 7.6 oz (67.8 kg) IBW/kg (Calculated) : 50.1  Heparin Dosing Weight: 62.1 kg  Vital Signs: Temp: 98.4 F (36.9 C) (01/27 0500) BP: 152/77 mmHg (01/27 0500) Pulse Rate: 61  (01/27 0500)  Labs:  Basename 12/01/12 0440 11/30/12 0715 11/29/12 1411 11/29/12 0821 11/29/12 0820 11/29/12 0500 11/28/12 0920  HGB 9.6* 9.9* -- -- -- -- --  HCT 29.7* 31.0* -- -- -- 28.5* --  PLT 227 239 -- -- -- 239 --  APTT -- -- -- -- -- -- --  LABPROT -- -- -- -- -- -- 13.9  INR -- -- -- -- -- -- 1.08  HEPARINUNFRC 0.30 0.51 0.32 -- -- -- --  CREATININE -- 0.82 -- -- 0.86 -- 0.88  CKTOTAL -- -- -- -- -- -- --  CKMB -- -- -- -- -- -- --  TROPONINI -- -- -- 1.34* -- -- 2.15*    Estimated Creatinine Clearance: 55.2 ml/min (by C-G formula based on Cr of 0.82).   Medications:  Scheduled:     . aspirin EC  81 mg Oral Daily  . atorvastatin  40 mg Oral q1800  . budesonide-formoterol  2 puff Inhalation BID  . insulin aspart  0-15 Units Subcutaneous TID WC  . irbesartan  150 mg Oral Daily  . isosorbide mononitrate  60 mg Oral Daily  . metoprolol tartrate  100 mg Oral BID  . sodium chloride  3 mL Intravenous Q12H   Infusions:     . heparin 1,000 Units/hr (11/30/12 1146)    Assessment: 72 yo F presented to ED 1/23 with CP.  Now s/p cath 1/24 showing multi-vessel disease.  TCTS tentatively planning OHS for 1/30.  Heparin is low-end therapeutic on 1000 units/hr.  No bleeding noted.  Goal of Therapy:  Heparin level 0.3-0.7 units/ml   Plan:  Increase heparin rate slightly to 1050 units/hr to stay in therapeutic range.  Continue daily heparin level and CBC. Follow up plans for OHS.  Bayard Hugger,  PharmD, BCPS  Clinical Pharmacist  Pager: [phone]

## 2012-12-01 NOTE — Progress Notes (Signed)
[facility]  DAILY PROGRESS NOTE   Subjective:  No chest pain overnight. Complaining of leg pain, somewhat with walking, but not true claudication.  For LE dopplers today.   Objective:  Temp:  [98.4 F (36.9 C)-98.9 F (37.2 C)] 98.4 F (36.9 C) (01/27 0500) Pulse Rate:  [60-64] 61  (01/27 0500) Resp:  [16-20] 20  (01/27 0500) BP: (149-152)/(55-77) 152/77 mmHg (01/27 0500) SpO2:  [94 %-97 %] 94 % (01/27 0837) Weight:  [67.8 kg (149 lb 7.6 oz)] 67.8 kg (149 lb 7.6 oz) (01/27 0500) Weight change: -0.24 kg (-8.5 oz)  Intake/Output from previous day: 01/26 0701 - 01/27 0700 In: 480 [P.O.:480] Out: 400 [Urine:400]  Intake/Output from this shift:    Medications: Current Facility-Administered Medications  Medication Dose Route Frequency Provider Last Rate Last Dose  . 0.9 %  sodium chloride infusion  250 mL Intravenous PRN Marykay Lex, MD 10 mL/hr at 11/28/12 1730 250 mL at 11/28/12 1730  . acetaminophen (TYLENOL) tablet 650 mg  650 mg Oral Q4H PRN Wilburt Finlay, PA      . aspirin EC tablet 81 mg  81 mg Oral Daily Wilburt Finlay, PA   81 mg at 11/30/12 1045  . atorvastatin (LIPITOR) tablet 40 mg  40 mg Oral q1800 Wilburt Finlay, PA   40 mg at 11/30/12 1712  . budesonide-formoterol (SYMBICORT) 160-4.5 MCG/ACT inhaler 2 puff  2 puff Inhalation BID Kerin Perna, MD   2 puff at 12/01/12 [phone]  . feeding supplement (GLUCERNA SHAKE) liquid 237 mL  237 mL Oral PRN Tonye Becket, RD      . heparin ADULT infusion 100 units/mL (25000 units/250 mL)  1,050 Units/hr Intravenous Continuous Lennette Bihari, MD 10.5 mL/hr at 12/01/12 0852 1,050 Units/hr at 12/01/12 0852  . insulin aspart (novoLOG) injection 0-15 Units  0-15 Units Subcutaneous TID WC Wilburt Finlay, PA   2 Units at 11/30/12 1246  . irbesartan (AVAPRO) tablet 150 mg  150 mg Oral Daily Wilburt Finlay, PA   150 mg at 11/30/12 1045  . isosorbide mononitrate (IMDUR) 24 hr tablet 60 mg  60 mg Oral Daily Marykay Lex, MD   60 mg at 11/30/12 1045  . metoprolol (LOPRESSOR) tablet 100 mg  100 mg Oral BID Wilburt Finlay, PA   100 mg at 11/30/12 2227  . ondansetron (ZOFRAN) injection 4 mg  4 mg Intravenous Q6H PRN Wilburt Finlay, PA      . sodium chloride 0.9 % injection 3 mL  3 mL Intravenous Q12H Marykay Lex, MD   3 mL at 11/29/12 0920  . sodium chloride 0.9 % injection 3 mL  3 mL Intravenous PRN Marykay Lex, MD        Physical Exam: General appearance: alert and no distress Neck: no adenopathy, no carotid bruit, no JVD, supple, symmetrical, trachea midline and thyroid not enlarged, symmetric, no tenderness/mass/nodules Lungs: clear to auscultation bilaterally Heart: systolic murmur: early systolic 2/6, blowing at 2nd right intercostal space Abdomen: soft, non-tender; bowel sounds normal; no masses,  no organomegaly Extremities: extremities normal, atraumatic, no cyanosis or edema Pulses: 2+ and symmetric  Lab Results: Results for orders placed during the hospital encounter of 11/27/12 (from the past 48 hour(s))  GLUCOSE, CAPILLARY     Status: Abnormal   Collection Time   11/29/12 11:17 AM      Component Value Range Comment   Glucose-Capillary 111 (*) 70 - 99 mg/dL    Comment  1 Notify RN     HEPARIN LEVEL (UNFRACTIONATED)     Status: Normal   Collection Time   11/29/12  2:11 PM      Component Value Range Comment   Heparin Unfractionated 0.32  0.30 - 0.70 IU/mL   GLUCOSE, CAPILLARY     Status: Abnormal   Collection Time   11/29/12  4:15 PM      Component Value Range Comment   Glucose-Capillary 119 (*) 70 - 99 mg/dL   GLUCOSE, CAPILLARY     Status: Abnormal   Collection Time   11/29/12  9:13 PM      Component Value Range Comment   Glucose-Capillary 108 (*) 70 - 99 mg/dL   CBC     Status: Abnormal   Collection Time   11/30/12  7:15 AM      Component Value Range Comment   WBC 7.6  4.0 - 10.5 K/uL    RBC 3.84 (*) 3.87 - 5.11 MIL/uL    Hemoglobin 9.9 (*) 12.0 - 15.0 g/dL    HCT 96.0 (*)  45.4 - 46.0 %    MCV 80.7  78.0 - 100.0 fL    MCH 25.8 (*) 26.0 - 34.0 pg    MCHC 31.9  30.0 - 36.0 g/dL    RDW 09.8  11.9 - 14.7 %    Platelets 239  150 - 400 K/uL   HEPARIN LEVEL (UNFRACTIONATED)     Status: Normal   Collection Time   11/30/12  7:15 AM      Component Value Range Comment   Heparin Unfractionated 0.51  0.30 - 0.70 IU/mL   BASIC METABOLIC PANEL     Status: Abnormal   Collection Time   11/30/12  7:15 AM      Component Value Range Comment   Sodium 136  135 - 145 mEq/L    Potassium 4.5  3.5 - 5.1 mEq/L    Chloride 101  96 - 112 mEq/L    CO2 26  19 - 32 mEq/L    Glucose, Bld 129 (*) 70 - 99 mg/dL    BUN 11  6 - 23 mg/dL    Creatinine, Ser 8.29  0.50 - 1.10 mg/dL    Calcium 9.2  8.4 - 56.2 mg/dL    GFR calc non Af Amer 69 (*) >90 mL/min    GFR calc Af Amer 80 (*) >90 mL/min   GLUCOSE, CAPILLARY     Status: Abnormal   Collection Time   11/30/12  7:45 AM      Component Value Range Comment   Glucose-Capillary 142 (*) 70 - 99 mg/dL    Comment 1 Notify RN     GLUCOSE, CAPILLARY     Status: Abnormal   Collection Time   11/30/12 11:17 AM      Component Value Range Comment   Glucose-Capillary 135 (*) 70 - 99 mg/dL    Comment 1 Notify RN     URINALYSIS, ROUTINE W REFLEX MICROSCOPIC     Status: Abnormal   Collection Time   11/30/12 12:49 PM      Component Value Range Comment   Color, Urine YELLOW  YELLOW    APPearance CLEAR  CLEAR    Specific Gravity, Urine >1.046 (*) 1.005 - 1.030    pH 7.0  5.0 - 8.0    Glucose, UA 500 (*) NEGATIVE mg/dL    Hgb urine dipstick NEGATIVE  NEGATIVE    Bilirubin Urine NEGATIVE  NEGATIVE  Ketones, ur NEGATIVE  NEGATIVE mg/dL    Protein, ur NEGATIVE  NEGATIVE mg/dL    Urobilinogen, UA 1.0  0.0 - 1.0 mg/dL    Nitrite NEGATIVE  NEGATIVE    Leukocytes, UA NEGATIVE  NEGATIVE MICROSCOPIC NOT DONE ON URINES WITH NEGATIVE PROTEIN, BLOOD, LEUKOCYTES, NITRITE, OR GLUCOSE <1000 mg/dL.  GLUCOSE, CAPILLARY     Status: Normal   Collection Time    11/30/12  4:18 PM      Component Value Range Comment   Glucose-Capillary 94  70 - 99 mg/dL   SURGICAL PCR SCREEN     Status: Normal   Collection Time   11/30/12  5:17 PM      Component Value Range Comment   MRSA, PCR NEGATIVE  NEGATIVE    Staphylococcus aureus NEGATIVE  NEGATIVE   GLUCOSE, CAPILLARY     Status: Abnormal   Collection Time   11/30/12  8:57 PM      Component Value Range Comment   Glucose-Capillary 132 (*) 70 - 99 mg/dL   BLOOD GAS, ARTERIAL     Status: Abnormal   Collection Time   12/01/12  4:25 AM      Component Value Range Comment   FIO2 0.21      Delivery systems ROOM AIR      pH, Arterial 7.422  7.350 - 7.450    pCO2 arterial 39.7  35.0 - 45.0 mmHg    pO2, Arterial 93.6  80.0 - 100.0 mmHg    Bicarbonate 25.4 (*) 20.0 - 24.0 mEq/L    TCO2 26.6  0 - 100 mmol/L    Acid-Base Excess 1.4  0.0 - 2.0 mmol/L    O2 Saturation 98.5      Patient temperature 98.6      Collection site RIGHT BRACHIAL      Drawn by [id number]      Sample type ARTERIAL DRAW      Allens test (pass/fail) PASS  PASS   CBC     Status: Abnormal   Collection Time   12/01/12  4:40 AM      Component Value Range Comment   WBC 7.0  4.0 - 10.5 K/uL    RBC 3.73 (*) 3.87 - 5.11 MIL/uL    Hemoglobin 9.6 (*) 12.0 - 15.0 g/dL    HCT 62.9 (*) 52.8 - 46.0 %    MCV 79.6  78.0 - 100.0 fL    MCH 25.7 (*) 26.0 - 34.0 pg    MCHC 32.3  30.0 - 36.0 g/dL    RDW 41.3  24.4 - 01.0 %    Platelets 227  150 - 400 K/uL   HEPARIN LEVEL (UNFRACTIONATED)     Status: Normal   Collection Time   12/01/12  4:40 AM      Component Value Range Comment   Heparin Unfractionated 0.30  0.30 - 0.70 IU/mL   GLUCOSE, CAPILLARY     Status: Abnormal   Collection Time   12/01/12  6:32 AM      Component Value Range Comment   Glucose-Capillary 116 (*) 70 - 99 mg/dL     Imaging: Ct Chest W Contrast  11/30/2012  *RADIOLOGY REPORT*  Clinical Data: Coronary artery disease.  The patient is scheduled for CABG.  Apparent history of pulmonary  nodule.  The patient is a smoker.  CT CHEST WITH CONTRAST  Technique:  Multidetector CT imaging of the chest was performed following the standard protocol during bolus administration of intravenous contrast.  Contrast:  80 ml Omnipaque-300 IV  Comparison: Chest x-ray dated 11/27/2012  Findings: There are no pulmonary nodules or infiltrates.  Some small paratracheal and pretracheal lymph nodes identified in the mediastinum which are not enlarged.  Maximal short axis measurements of lymph nodes are 9-10 mm.  Significant atherosclerotic disease is identified of the aorta. Atherosclerotic plaque is present at the level of the aortic arch. The visualized proximal great vessels show bovine anatomy.  The proximal left subclavian artery shows approximately 50% stenosis. Primary level of atherosclerotic disease involves the descending thoracic aorta and visualized upper to mid abdominal aorta.  The descending thoracic aorta is mildly dilated, measuring up to 3.4 cm in maximal diameter.  Diffuse and irregular partially calcified plaque, ulcerated noncalcified plaque and irregular mural thrombus is identified.  The proximal abdominal aorta measures 3 cm in transverse diameter at the level of the celiac axis.  The visualized upper abdominal aorta is well opacified and demonstrates a large amount of irregular ulcerated soft plaque/thrombus and dilated partially calcified plaque. Significant atherosclerosis is seen at the origins of both celiac axis and superior mesenteric artery with a roughly 50 - 70% narrowing at the origin of the celiac axis and a significant 70 - 90% stenosis at the origin of the superior mesenteric artery. Bilateral renal arteries are also identified with heavily calcified plaque at the origin of the right renal artery causing probable moderate range stenosis and partially calcified plaque at the origin of the left renal artery causing likely mild stenosis.  The heart is enlarged.  Diffuse, calcified  atherosclerotic plaque is identified in the coronary artery tree.  The bony thorax shows diffuse osteopenia.  No compression fractures or focal bony lesions are identified.  IMPRESSION:  1.  No evidence of pulmonary nodule or lymphadenopathy in the chest. 2.  Significant atherosclerosis of the descending thoracic and visualized abdominal aorta as above.  There is a large amount of ulcerated soft plaque and component of mural thrombus, particularly in the lower descending thoracic and upper abdominal aorta.  This may potentially predispose the patient for future embolic complication. The descending thoracic aorta is mildly dilated, measuring 3.4 cm in maximum diameter. 3. Significant atherosclerosis of the visceral arteries identified in the upper abdomen.  Moderate origin stenosis of the celiac axis and high-grade stenosis at the origin of the superior mesenteric artery noted.  Both renal arteries show atherosclerotic disease at the origins without critical stenosis.   Original Report Authenticated By: Irish Lack, M.D.     Assessment:  1. Principal Problem: 2.  *NSTEMI (non-ST elevated myocardial infarction) 3. Active Problems: 4.  PAD (peripheral artery disease) 5.  CAD (coronary artery disease): 1993 PTCA to circumflex.  2002 2 BMS stents to ostial, mid RCA now with severe multivessel CAD for possible CABG 6.  DM2 (diabetes mellitus, type 2) 7.  HTN (hypertension) 8.  HLD (hyperlipidemia) 9.   Plan:  72 yo female with diffuse vascular disease by CT angiogram and multivessel CAD for CABG, possibly later this week. Per Dr. Maren Beach, a RHC is requested due to elevated pulmonary pressures. Will try to schedule tomorrow.  Keep NPO after midnight.  Time Spent Directly with Patient:  15 minutes  Length of Stay:  LOS: 4 days   Chrystie Nose, MD, [facility] Attending Cardiologist [facility]  HILTY,Kenneth C 12/01/2012, 9:19 AM

## 2012-12-02 ENCOUNTER — Encounter (HOSPITAL_COMMUNITY)
Admission: EM | Disposition: A | Discharge status: Home / Self Care | Payer: Self-pay | Source: Home / Self Care | Attending: Cardiothoracic Surgery

## 2012-12-02 HISTORY — PX: RIGHT HEART CATHETERIZATION: SHX5447

## 2012-12-02 LAB — POCT I-STAT 3, VENOUS BLOOD GAS (G3P V)
Bicarbonate: 27.6 mEq/L — ABNORMAL HIGH (ref 20.0–24.0)
TCO2: 29 mmol/L (ref 0–100)
pH, Ven: 7.387 — ABNORMAL HIGH (ref 7.250–7.300)
pO2, Ven: 31 mmHg (ref 30.0–45.0)

## 2012-12-02 LAB — HEPARIN LEVEL (UNFRACTIONATED): Heparin Unfractionated: 0.51 IU/mL (ref 0.30–0.70)

## 2012-12-02 LAB — GLUCOSE, CAPILLARY
Glucose-Capillary: 118 mg/dL — ABNORMAL HIGH (ref 70–99)
Glucose-Capillary: 127 mg/dL — ABNORMAL HIGH (ref 70–99)

## 2012-12-02 LAB — PROTIME-INR
INR: 1.03 (ref 0.00–1.49)
Prothrombin Time: 13.4 seconds (ref 11.6–15.2)

## 2012-12-02 LAB — BASIC METABOLIC PANEL
CO2: 27 mEq/L (ref 19–32)
Chloride: 102 mEq/L (ref 96–112)
Creatinine, Ser: 0.82 mg/dL (ref 0.50–1.10)
Potassium: 4.7 mEq/L (ref 3.5–5.1)

## 2012-12-02 LAB — CBC
Hemoglobin: 9.6 g/dL — ABNORMAL LOW (ref 12.0–15.0)
MCH: 25.9 pg — ABNORMAL LOW (ref 26.0–34.0)
RBC: 3.71 MIL/uL — ABNORMAL LOW (ref 3.87–5.11)

## 2012-12-02 SURGERY — RIGHT HEART CATH
Anesthesia: LOCAL

## 2012-12-02 MED ORDER — HEPARIN (PORCINE) IN NACL 100-0.45 UNIT/ML-% IJ SOLN
1100.0000 [IU]/h | INTRAMUSCULAR | Status: DC
  Administered 2012-12-02: 1050 [IU]/h via INTRAVENOUS
  Administered 2012-12-03: 1100 [IU]/h via INTRAVENOUS
  Filled 2012-12-02 (×3): qty 250

## 2012-12-02 MED ORDER — LIDOCAINE HCL (PF) 1 % IJ SOLN
INTRAMUSCULAR | Status: AC
  Filled 2012-12-02: qty 30

## 2012-12-02 MED ORDER — FENTANYL CITRATE 0.05 MG/ML IJ SOLN
INTRAMUSCULAR | Status: AC
  Filled 2012-12-02: qty 2

## 2012-12-02 MED ORDER — ACETAMINOPHEN 325 MG PO TABS: 650.0000 mg | ORAL_TABLET | ORAL | Status: DC | PRN

## 2012-12-02 MED ORDER — HYDRALAZINE HCL 10 MG PO TABS
10.0000 mg | ORAL_TABLET | Freq: Once | ORAL | Status: AC
  Administered 2012-12-02: 10 mg via ORAL
  Filled 2012-12-02: qty 1

## 2012-12-02 MED ORDER — HEPARIN (PORCINE) IN NACL 2-0.9 UNIT/ML-% IJ SOLN
INTRAMUSCULAR | Status: AC
  Filled 2012-12-02: qty 500

## 2012-12-02 MED ORDER — MIDAZOLAM HCL 2 MG/2ML IJ SOLN
INTRAMUSCULAR | Status: AC
  Filled 2012-12-02: qty 2

## 2012-12-02 MED ORDER — ONDANSETRON HCL 4 MG/2ML IJ SOLN: 4.0000 mg | Freq: Four times a day (QID) | INTRAMUSCULAR | Status: DC | PRN

## 2012-12-02 NOTE — Progress Notes (Signed)
L groin site benign at this time; dressing D&I; no bleeding noted; pt bedrest now until 1730 instead of 1630; pt lying flat and encouraged to keep L leg straight at this time; will cont. To monitor.

## 2012-12-02 NOTE — Progress Notes (Signed)
Hep gtt turned off at this time in prep for cardiac cath; will cont. To monitor.

## 2012-12-02 NOTE — Progress Notes (Signed)
Day of Surgery Procedure(s) (LRB): RIGHT HEART CATH (N/A) Subjective: L: main,unstable angina Severe TR, Pul HTN, hx smoking  Objective: Vital signs in last 24 hours: Temp:  [98 F (36.7 C)-99.1 F (37.3 C)] 98 F (36.7 C) (01/28 1453) Pulse Rate:  [54-78] 71  (01/28 1502) Cardiac Rhythm:  [-] Normal sinus rhythm (01/28 0804) Resp:  [18-20] 18  (01/28 0617) BP: (145-198)/(59-86) 167/67 mmHg (01/28 1502) SpO2:  [98 %-99 %] 99 % (01/28 1502) Weight:  [149 lb 14.4 oz (67.994 kg)] 149 lb 14.4 oz (67.994 kg) (01/28 0617)  Hemodynamic parameters for last 24 hours:  sinus Intake/Output from previous day: 01/27 0701 - 01/28 0700 In: 1025.3 [I.V.:1025.3] Out: 400 [Urine:400] Intake/Output this shift Total I/O In: 305.6 [P.O.:240; I.V.:65.6] Out: -   Soft systolic heart murmur Lab Results:  Basename 12/02/12 0435 12/01/12 0440  WBC 8.1 7.0  HGB 9.6* 9.6*  HCT 29.4* 29.7*  PLT 215 227   BMET:  Basename 12/02/12 0435 11/30/12 0715  NA 137 136  K 4.7 4.5  CL 102 101  CO2 27 26  GLUCOSE 119* 129*  BUN 12 11  CREATININE 0.82 0.82  CALCIUM 9.4 9.2    PT/INR:  Basename 12/02/12 0435  LABPROT 13.4  INR 1.03   ABG    Component Value Date/Time   PHART 7.422 12/01/2012 0425   HCO3 25.4* 12/01/2012 0425   TCO2 26.6 12/01/2012 0425   O2SAT 98.5 12/01/2012 0425   CBG (last 3)   Basename 12/02/12 1403 12/02/12 1129 12/02/12 0613  GLUCAP 112* 127* 118*    Assessment/Plan: S/P Procedure(s) (LRB): RIGHT HEART CATH (N/A) Plan CABG w/ Tricuspid repair 1/30   LOS: 5 days    VAN TRIGT III,Kendra Todd 12/02/2012

## 2012-12-02 NOTE — Progress Notes (Signed)
ANTICOAGULATION CONSULT NOTE - Follow Up Consult  Pharmacy Consult for Heparin Indication: multivessel CAD awaiting CABG  Allergies  Allergen Reactions  . Iron Hives  . Vicodin (Hydrocodone-Acetaminophen)   . Vioxx (Rofecoxib)     Patient Measurements: Height: 5\' 2"  (157.5 cm) Weight: 149 lb 14.4 oz (67.994 kg) IBW/kg (Calculated) : 50.1  Heparin Dosing Weight: 62.1 kg  Vital Signs: Temp: 98 F (36.7 C) (01/28 1453) Temp src: Oral (01/28 1453) BP: 167/67 mmHg (01/28 1502) Pulse Rate: 71  (01/28 1502)  Labs:  Basename 12/02/12 0435 12/01/12 0440 11/30/12 0715  HGB 9.6* 9.6* --  HCT 29.4* 29.7* 31.0*  PLT 215 227 239  APTT -- -- --  LABPROT 13.4 -- --  INR 1.03 -- --  HEPARINUNFRC 0.51 0.30 0.51  CREATININE 0.82 -- 0.82  CKTOTAL -- -- --  CKMB -- -- --  TROPONINI -- -- --    Estimated Creatinine Clearance: 55.3 ml/min (by C-G formula based on Cr of 0.82).   Medications:  Infusions:    . heparin 1,050 Units/hr (12/02/12 1009)  . [DISCONTINUED] sodium chloride 10 mL/hr (12/02/12 0759)    Assessment: 72 y/o female to resume heparin post-cath 6 hours after sheath removal which was removed at 14:06. Plan is for CABG on 1/30. No bleeding noted. Last heparin level was therapeutic on 1050 units/hr.  Goal of Therapy:  Heparin level 0.3-0.7 units/ml Monitor platelets by anticoagulation protocol: Yes   Plan:  -Resume heparin drip IV at 1050 units/hr at 20:15 tonight -Heparin level 8 hours after resumed (with am labs) -Daily heparin level and CBC while on heparin  [facility], [place].D., BCPS Clinical Pharmacist Pager: [phone] 12/02/2012 3:15 PM

## 2012-12-02 NOTE — Progress Notes (Signed)
RN called to room; L groin site bleeding; dressing removed and manual pressure applied; new dressing applied once hemastasis achieved; + pedal pulse L foot; pt denies pain; VSS; will cont. To monitor.

## 2012-12-02 NOTE — Progress Notes (Signed)
Hep gtt restarted at this time will cath later today; pt made aware; will cont. To monitor.

## 2012-12-02 NOTE — Care Management Note (Addendum)
    Page 1 of 2   12/11/2012     3:06:50 PM   CARE MANAGEMENT NOTE 12/11/2012  Patient:  Kendra Todd, Kendra Todd   Account Number:  [id number]  Date Initiated:  12/02/2012  Documentation initiated by:  Akeisha Lagerquist  Subjective/Objective Assessment:   PT S/P NSTEMI ON 11/27/12.  PTA, PT INDEPENDENT OF ADLS.     Action/Plan:   PT SCHEDULED FOR CABG ON THURSDAY, 1/30.  WILL FOLLOW FOR HOME NEEDS POST OP.   Anticipated DC Date:  12/09/2012   Anticipated DC Plan:  HOME W HOME HEALTH SERVICES      DC Planning Services  CM consult      [facility] Choice  HOME HEALTH   Choice offered to / List presented to:  C-1 Patient   DME arranged  [facility]  BEDSIDE [facility]      DME agency  [facility]     HH arranged  HH-1 RN  HH-2 PT      [facility] agency  [facility]   Status of service:  Completed, signed off Medicare Important Message given?   (If response is "NO", the following Medicare IM given date fields will be blank) Date Medicare IM given:   Date Additional Medicare IM given:    Discharge Disposition:  HOME W HOME HEALTH SERVICES  Per UR Regulation:  Reviewed for med. necessity/level of care/duration of stay  If discussed at Long Length of Stay Meetings, dates discussed:    Comments:  Contact:  Brede,[name] [phone]   [phone]  12/11/12 Kendra Brannan,RN,BSN [phone] PT FOR DC LATER TODAY.  NOTIFIED [facility] OF DC; DME TO BE DELIVERED TO ROOM PRIOR TO DC.  [facility] AWARE OF ORDERS FOR PT/INR DRAW.  12/10/12 Kendra Bordenave,RN,BSN [phone] PT PROGRESSING WELL.  WILL NEED HH FOLLOW UP AT DC. REFERRAL TO [facility], PER PT CHOICE.  RW AND BSC OBTAINED, PER PT REQUEST.  NIECE TO PROVIDE 24HR CARE AT DC.  12-08-12 3:30pm Kendra Todd, RNBSN [phone] post op AFib - ?? need for SNF for rehab on discharge - PT/OT consults placed.  SW consult placed.  12-04-12- 2:15pm Kendra Todd, RNBSN  [phone] Post op CABG x3 today - CM will follow for needs.  Will need to verify 24/7  care post discharge.

## 2012-12-02 NOTE — Progress Notes (Signed)
NUTRITION FOLLOW UP  Intervention:    Continue Glucerna Shake PRN (220 kcals, 9.9 gm protein per 8 fl oz bottle) RD to follow for nutrition care plan  Nutrition Dx:   Unintentional weight loss related to poor po intake as evidenced by MST report, ongoing  Goal:   Oral intake with meals & supplements to meet >/= 90% of estimated nutrition needs, progressing  Monitor:   PO & supplemental intake, weight, labs, I/O's  Assessment:   Patient reports a good appetite.  PO intake 50-100% per flowsheet records.  Has Glucerna Shakes ordered PRN.  NPO at this time for R heart cath to reassess pulmonary pressure.  CABG later in the week.  Height: Ht Readings from Last 1 Encounters:  11/27/12 5\' 2"  (1.575 m)    Weight Status:   Wt Readings from Last 1 Encounters:  12/02/12 149 lb 14.4 oz (67.994 kg)    Re-estimated needs:  Kcal: 1400-1600 Protein: 65-75 gm Fluid: 1.4-1.6 L  Skin: Intact  Diet Order: NPO   Intake/Output Summary (Last 24 hours) at 12/02/12 1119 Last data filed at 12/02/12 1009  Gross per 24 hour  Intake 1090.94 ml  Output      0 ml  Net 1090.94 ml    Last BM: 1/28  Labs:   Lab 12/02/12 0435 11/30/12 0715 11/29/12 0820  NA 137 136 136  K 4.7 4.5 4.7  CL 102 101 102  CO2 27 26 24   BUN 12 11 12   CREATININE 0.82 0.82 0.86  CALCIUM 9.4 9.2 9.3  MG -- -- --  PHOS -- -- --  GLUCOSE 119* 129* 131*    CBG (last 3)   Basename 12/02/12 0613 12/01/12 2117 12/01/12 1556  GLUCAP 118* 139* 98    Scheduled Meds:   . aspirin EC  81 mg Oral Daily  . atorvastatin  40 mg Oral q1800  . budesonide-formoterol  2 puff Inhalation BID  . insulin aspart  0-15 Units Subcutaneous TID WC  . irbesartan  150 mg Oral BID  . isosorbide mononitrate  60 mg Oral Daily  . metoprolol tartrate  100 mg Oral BID  . sodium chloride  3 mL Intravenous Q12H  . sodium chloride  3 mL Intravenous Q12H    Continuous Infusions:   . sodium chloride 10 mL/hr (12/02/12 0759)  .  heparin 1,050 Units/hr (12/02/12 1009)    Maureen Chatters, RD, LDN Pager #: [phone] After-Hours Pager #: [phone]

## 2012-12-02 NOTE — CV Procedure (Signed)
Kendra Todd is a 72 y.o. female    [id number]  [id number] LOCATION:  FACILITY: [facility]  PHYSICIAN: Lennette Bihari, MD, [facility] [place] Sep 10, 1941   DATE OF PROCEDURE:  12/02/2012  DATE OF DISCHARGE:  [facility]  CARDIAC CATHETERIZATION     Indications: Pt is a 72 yo AAF who is scheduled to undergo CABG for multivessel CAD later this week. An echo study has demonstrated significant pulmonary HTN with PA pressure ~61 mm HG, and showed severe TR. She has normal LV function. R heart cath was requested by Dr Morton Peters prior to planned CABG.     PROCEDURE DESCRIPTION: R Heart Cardiac Catheterization  The patient was brought to the second floor  [place] Cardiac cath lab in the postabsorptive state. She was premedicated with Versed 2 mg and fetanyl 25 micrograms.  Her left groin was prepped and shaved in usual sterile fashion. Xylocaine 1% was used  for local anesthesia. A 7 French sheath was inserted into the left femoral vein.   A swan Gang catheter was advanced into the RA, RV position but could not advance into the PA despite a o,o25 wire. This was then exchanged for a S-shaped Candis Schatz which was successfully able to be advanced into the L PA with wire support. Cardiac outputs were determined by the Thermodilution and assumed Fick method. She tolerated the procedure well.   HEMODYNAMICS:    RA: 12/11 initially; 14/13 on exit RV:  56/12 PA: 55/21, mean 35 initially;  58/23, mean 40 on pullback PC: a 37 v 29; mean 29  Fick CO: 4.78  CI:2.8 Thermo CO: 2.9  CI: 1.7   IMPRESSION:  Elevated R heart pressures with moderate pulmonary hypertension.   Lennette Bihari, MD, [facility] 12/02/2012 2:02 PM

## 2012-12-02 NOTE — Progress Notes (Signed)
Pt's BP 184/80 this morning, asymptomatic upon assessment.  Nada Boozer, NP, notified and order received for one-time dose Hydralazine 10mg  po.  Awaiting dose to be sent from pharmacy.  Will continue to monitor.  Arva Chafe

## 2012-12-02 NOTE — Progress Notes (Signed)
[facility] Progress Note  Subjective:  No chest pain.  Objective:   Vital Signs in the last 24 hours: Temp:  [98 F (36.7 C)-99.1 F (37.3 C)] 99.1 F (37.3 C) (01/28 0617) Pulse Rate:  [62-78] 68  (01/28 0901) Resp:  [18-20] 18  (01/28 0617) BP: (145-198)/(59-86) 174/75 mmHg (01/28 0901) SpO2:  [93 %-99 %] 98 % (01/28 0617) Weight:  [67.994 kg (149 lb 14.4 oz)] 67.994 kg (149 lb 14.4 oz) (01/28 0617)  Intake/Output from previous day: 01/27 0701 - 01/28 0700 In: 1025.3 [I.V.:1025.3] Out: 400 [Urine:400]  Scheduled:   . aspirin EC  81 mg Oral Daily  . atorvastatin  40 mg Oral q1800  . budesonide-formoterol  2 puff Inhalation BID  . insulin aspart  0-15 Units Subcutaneous TID WC  . irbesartan  150 mg Oral BID  . isosorbide mononitrate  60 mg Oral Daily  . metoprolol tartrate  100 mg Oral BID  . sodium chloride  3 mL Intravenous Q12H  . sodium chloride  3 mL Intravenous Q12H      . sodium chloride 10 mL/hr (12/02/12 0759)  . heparin 1,050 Units/hr (12/02/12 0902)   Physical Exam:   General appearance: alert, cooperative and no distress Neck: no JVD and supple, symmetrical, trachea midline Lungs: clear to auscultation bilaterally Heart: S1, S2 normal and 1 - 6 sem Abdomen: soft, non-tender; bowel sounds normal; no masses,  no organomegaly Extremities: no edema, redness or tenderness in the calves or thighs   Rate: 68  Rhythm: normal sinus rhythm  Lab Results:    Basename 12/02/12 0435 11/30/12 0715  NA 137 136  K 4.7 4.5  CL 102 101  CO2 27 26  GLUCOSE 119* 129*  BUN 12 11  CREATININE 0.82 0.82   No results found for this basename: TROPONINI:2,CK,MB:2 in the last 72 hours Hepatic Function Panel No results found for this basename: PROT,ALBUMIN,AST,ALT,ALKPHOS,BILITOT,BILIDIR,IBILI in the last 72 hours  Basename 12/02/12 0435  INR 1.03    Lipid Panel     Component Value Date/Time   CHOL 160 11/28/2012 0239   TRIG 59  11/28/2012 0239   HDL 49 11/28/2012 0239   CHOLHDL 3.3 11/28/2012 0239   VLDL 12 11/28/2012 0239   LDLCALC 99 11/28/2012 0239     Imaging:  Echo Study Conclusions  - Left ventricle: The cavity size was normal. There was moderate asymmetric hypertrophy. Systolic function was normal. The estimated ejection fraction was in the range of 55% to 60%. Mild inferobasilar hypokinesis cannot be excluded. Features are consistent with a pseudonormal left ventricular filling pattern, with concomitant abnormal relaxation and increased filling pressure (grade 2 diastolic dysfunction). Doppler parameters are consistent with elevated mean left atrial filling pressure. - Aortic valve: Trileaflet; mildly thickened, mildly calcified leaflets most prominently affecting the noncoronary cusp. - Mitral valve: Mildly to moderately calcified annulus. Mildly thickened leaflets . Mild to moderate regurgitation. - Left atrium: The atrium was mildly dilated. - Right ventricle: Systolic function was normal. Systolic pressure was increased. - Right atrium: The atrium was mildly dilated. - Atrial septum: No defect or patent foramen ovale was identified. - Tricuspid valve: Severe regurgitation. - Pulmonary arteries: PA peak pressure: 61mm Hg (S). Impressions:  - The right ventricular systolic pressure was increased consistent with severe pulmonary hypertension   Assessment/Plan:   Principal Problem:  *NSTEMI (non-ST elevated myocardial infarction) Active Problems:  PAD (peripheral artery disease)  CAD (coronary artery disease): 1993 PTCA to circumflex.  2002  2 BMS stents to ostial, mid RCA now with severe multivessel CAD for possible CABG  DM2 (diabetes mellitus, type 2)  HTN (hypertension)  HLD (hyperlipidemia)   For R heart cath today to reassess pulmonary pressure. CABG later in the week. Discussed procedure with patient.  Lennette Bihari, MD, [facility] 12/02/2012, 9:16 AM

## 2012-12-02 NOTE — Progress Notes (Signed)
ANTICOAGULATION CONSULT NOTE - Follow Up Consult  Pharmacy Consult for Heparin Indication: CAD awaiting TCTS evaluation for OHS  Allergies  Allergen Reactions  . Iron Hives  . Vicodin (Hydrocodone-Acetaminophen)   . Vioxx (Rofecoxib)     Patient Measurements: Height: 5\' 2"  (157.5 cm) Weight: 149 lb 14.4 oz (67.994 kg) IBW/kg (Calculated) : 50.1  Heparin Dosing Weight: 62.1 kg  Vital Signs: Temp: 99.1 F (37.3 C) (01/28 0617) Temp src: Oral (01/28 0617) BP: 174/75 mmHg (01/28 0901) Pulse Rate: 68  (01/28 0901)  Labs:  Basename 12/02/12 0435 12/01/12 0440 11/30/12 0715  HGB 9.6* 9.6* --  HCT 29.4* 29.7* 31.0*  PLT 215 227 239  APTT -- -- --  LABPROT 13.4 -- --  INR 1.03 -- --  HEPARINUNFRC 0.51 0.30 0.51  CREATININE 0.82 -- 0.82  CKTOTAL -- -- --  CKMB -- -- --  TROPONINI -- -- --    Estimated Creatinine Clearance: 55.3 ml/min (by C-G formula based on Cr of 0.82).   Medications:  Scheduled:     . [COMPLETED] aspirin  324 mg Oral Pre-Cath  . aspirin EC  81 mg Oral Daily  . atorvastatin  40 mg Oral q1800  . budesonide-formoterol  2 puff Inhalation BID  . [COMPLETED] hydrALAZINE  10 mg Oral Once  . insulin aspart  0-15 Units Subcutaneous TID WC  . irbesartan  150 mg Oral BID  . isosorbide mononitrate  60 mg Oral Daily  . metoprolol tartrate  100 mg Oral BID  . sodium chloride  3 mL Intravenous Q12H  . sodium chloride  3 mL Intravenous Q12H  . [DISCONTINUED] irbesartan  150 mg Oral Daily   Infusions:     . sodium chloride 10 mL/hr (12/02/12 0759)  . heparin 1,050 Units/hr (12/02/12 0902)    Assessment: 72 yo F presented to ED 1/23 with CP.  Now s/p cath 1/24 showing multi-vessel disease.  TCTS tentatively planning OHS for 1/30.    AM Heparin level is therapeutic, patient is having RHC this morning to evaluate elevated pulmonary pressure. Hgb/plts are stable  Goal of Therapy:  Heparin level 0.3-0.7 units/ml   Plan:  No change for IV heparin F/u  plans to resume heparin after cath Follow up plans for OHS.  Bayard Hugger, PharmD, BCPS  Clinical Pharmacist  Pager: [phone]

## 2012-12-03 ENCOUNTER — Encounter (HOSPITAL_COMMUNITY): Payer: Self-pay | Admitting: Cardiology

## 2012-12-03 ENCOUNTER — Encounter (HOSPITAL_COMMUNITY): Payer: Self-pay | Admitting: Anesthesiology

## 2012-12-03 DIAGNOSIS — I071 Rheumatic tricuspid insufficiency: Secondary | ICD-10-CM | POA: Diagnosis present

## 2012-12-03 DIAGNOSIS — I272 Pulmonary hypertension, unspecified: Secondary | ICD-10-CM

## 2012-12-03 HISTORY — DX: Rheumatic tricuspid insufficiency: I07.1

## 2012-12-03 HISTORY — DX: Pulmonary hypertension, unspecified: I27.20

## 2012-12-03 LAB — CBC
HCT: 29.5 % — ABNORMAL LOW (ref 36.0–46.0)
HCT: 30.4 % — ABNORMAL LOW (ref 36.0–46.0)
Hemoglobin: 9.4 g/dL — ABNORMAL LOW (ref 12.0–15.0)
Hemoglobin: 9.9 g/dL — ABNORMAL LOW (ref 12.0–15.0)
MCH: 26 pg (ref 26.0–34.0)
MCHC: 31.9 g/dL (ref 30.0–36.0)
MCV: 79.9 fL (ref 78.0–100.0)
MCV: 79.8 fL (ref 78.0–100.0)
Platelets: 247 10*3/uL (ref 150–400)
RBC: 3.81 MIL/uL — ABNORMAL LOW (ref 3.87–5.11)
RDW: 15.5 % (ref 11.5–15.5)
WBC: 8.5 10*3/uL (ref 4.0–10.5)

## 2012-12-03 LAB — GLUCOSE, CAPILLARY
Glucose-Capillary: 128 mg/dL — ABNORMAL HIGH (ref 70–99)
Glucose-Capillary: 128 mg/dL — ABNORMAL HIGH (ref 70–99)
Glucose-Capillary: 104 mg/dL — ABNORMAL HIGH (ref 70–99)
Glucose-Capillary: 121 mg/dL — ABNORMAL HIGH (ref 70–99)

## 2012-12-03 LAB — BASIC METABOLIC PANEL
BUN: 14 mg/dL (ref 6–23)
CO2: 27 mEq/L (ref 19–32)
Calcium: 9.1 mg/dL (ref 8.4–10.5)
Chloride: 100 mEq/L (ref 96–112)
Creatinine, Ser: 0.84 mg/dL (ref 0.50–1.10)
GFR calc Af Amer: 78 mL/min — ABNORMAL LOW (ref >90)
GFR calc non Af Amer: 67 mL/min — ABNORMAL LOW (ref >90)
Glucose, Bld: 126 mg/dL — ABNORMAL HIGH (ref 70–99)
Potassium: 4.2 mEq/L (ref 3.5–5.1)
Sodium: 137 mEq/L (ref 135–145)

## 2012-12-03 LAB — ABO/RH: ABO/RH(D): A POS

## 2012-12-03 LAB — COMPREHENSIVE METABOLIC PANEL
AST: 15 U/L (ref 0–37)
Albumin: 3.1 g/dL — ABNORMAL LOW (ref 3.5–5.2)
BUN: 18 mg/dL (ref 6–23)
Calcium: 9.4 mg/dL (ref 8.4–10.5)
Chloride: 97 mEq/L (ref 96–112)
Creatinine, Ser: 0.82 mg/dL (ref 0.50–1.10)
Total Bilirubin: 0.4 mg/dL (ref 0.3–1.2)
Total Protein: 6.6 g/dL (ref 6.0–8.3)

## 2012-12-03 LAB — PREPARE RBC (CROSSMATCH)

## 2012-12-03 MED ORDER — VERAPAMIL HCL 2.5 MG/ML IV SOLN
INTRAVENOUS | Status: AC
  Administered 2012-12-04: 09:00:00
  Filled 2012-12-03: qty 2.5

## 2012-12-03 MED ORDER — BISACODYL 5 MG PO TBEC: 5.0000 mg | DELAYED_RELEASE_TABLET | Freq: Once | ORAL | Status: DC

## 2012-12-03 MED ORDER — DEXMEDETOMIDINE HCL IN NACL 400 MCG/100ML IV SOLN
0.1000 ug/kg/h | INTRAVENOUS | Status: AC
  Administered 2012-12-04: 0.2 ug/kg/h via INTRAVENOUS
  Filled 2012-12-03: qty 100

## 2012-12-03 MED ORDER — METOPROLOL TARTRATE 12.5 MG HALF TABLET
12.5000 mg | ORAL_TABLET | Freq: Once | ORAL | Status: AC
  Administered 2012-12-04: 12.5 mg via ORAL
  Filled 2012-12-03: qty 1

## 2012-12-03 MED ORDER — VANCOMYCIN HCL 10 G IV SOLR
1250.0000 mg | INTRAVENOUS | Status: AC
  Administered 2012-12-04: 1250 mg via INTRAVENOUS
  Filled 2012-12-03: qty 1250

## 2012-12-03 MED ORDER — CHLORHEXIDINE GLUCONATE 4 % EX LIQD
60.0000 mL | Freq: Once | CUTANEOUS | Status: AC
  Administered 2012-12-04: 4 via TOPICAL
  Filled 2012-12-03: qty 60

## 2012-12-03 MED ORDER — DEXTROSE 5 % IV SOLN
1.5000 g | INTRAVENOUS | Status: AC
  Administered 2012-12-04: .75 g via INTRAVENOUS
  Administered 2012-12-04: 1.5 g via INTRAVENOUS
  Filled 2012-12-03: qty 1.5

## 2012-12-03 MED ORDER — DEXTROSE 5 % IV SOLN
750.0000 mg | INTRAVENOUS | Status: DC
  Filled 2012-12-03: qty 750

## 2012-12-03 MED ORDER — SODIUM CHLORIDE 0.9 % IV SOLN
INTRAVENOUS | Status: AC
  Administered 2012-12-04: 70 mL/h via INTRAVENOUS
  Filled 2012-12-03: qty 40

## 2012-12-03 MED ORDER — MAGNESIUM SULFATE 50 % IJ SOLN
40.0000 meq | INTRAMUSCULAR | Status: DC
  Filled 2012-12-03: qty 10

## 2012-12-03 MED ORDER — SODIUM CHLORIDE 0.9 % IV SOLN
INTRAVENOUS | Status: AC
  Administered 2012-12-04: 2 [IU]/h via INTRAVENOUS
  Filled 2012-12-03: qty 1

## 2012-12-03 MED ORDER — ALPRAZOLAM 0.25 MG PO TABS
0.2500 mg | ORAL_TABLET | ORAL | Status: DC | PRN
  Administered 2012-12-04: 0.25 mg via ORAL
  Filled 2012-12-03: qty 1

## 2012-12-03 MED ORDER — TEMAZEPAM 15 MG PO CAPS
15.0000 mg | ORAL_CAPSULE | Freq: Once | ORAL | Status: AC | PRN
  Administered 2012-12-03: 15 mg via ORAL
  Filled 2012-12-03: qty 1

## 2012-12-03 MED ORDER — POTASSIUM CHLORIDE 2 MEQ/ML IV SOLN
80.0000 meq | INTRAVENOUS | Status: DC
  Filled 2012-12-03: qty 40

## 2012-12-03 MED ORDER — DOPAMINE-DEXTROSE 3.2-5 MG/ML-% IV SOLN
2.0000 ug/kg/min | INTRAVENOUS | Status: DC
  Filled 2012-12-03: qty 250

## 2012-12-03 MED ORDER — PHENYLEPHRINE HCL 10 MG/ML IJ SOLN
30.0000 ug/min | INTRAVENOUS | Status: DC
  Filled 2012-12-03: qty 2

## 2012-12-03 MED ORDER — NITROGLYCERIN IN D5W 200-5 MCG/ML-% IV SOLN
2.0000 ug/min | INTRAVENOUS | Status: AC
  Administered 2012-12-04: 5 ug/min via INTRAVENOUS
  Filled 2012-12-03: qty 250

## 2012-12-03 MED ORDER — EPINEPHRINE HCL 1 MG/ML IJ SOLN
0.5000 ug/min | INTRAVENOUS | Status: DC
  Filled 2012-12-03: qty 4

## 2012-12-03 NOTE — Progress Notes (Signed)
1 Day Post-Op Procedure(s) (LRB): RIGHT HEART CATH (N/A) Subjective: L main dz 3+ TR  Mil-mod MR Pul HTN  Objective: Vital signs in last 24 hours: Temp:  [98 F (36.7 C)-98.6 F (37 C)] 98.1 F (36.7 C) (01/29 1427) Pulse Rate:  [65-83] 67  (01/29 1427) Cardiac Rhythm:  [-] Normal sinus rhythm (01/29 0742) Resp:  [18-20] 18  (01/29 1427) BP: (135-169)/(6-82) 135/76 mmHg (01/29 1427) SpO2:  [10 %-100 %] 10 % (01/29 0436) Weight:  [149 lb 14.4 oz (67.994 kg)] 149 lb 14.4 oz (67.994 kg) (01/29 0436)  Hemodynamic parameters for last 24 hours:  nsr  Intake/Output from previous day: 01/28 0701 - 01/29 0700 In: 1093 [P.O.:600; I.V.:493] Out: 1200 [Urine:1200] Intake/Output this shift: Total I/O In: 817.6 [P.O.:720; I.V.:97.6] Out: 700 [Urine:700]  Soft  systolic murmur  Lab Results:  Basename 12/03/12 1534 12/03/12 0505  WBC 8.5 7.9  HGB 9.9* 9.4*  HCT 30.4* 29.5*  PLT 247 234   BMET:  Basename 12/03/12 1534 12/03/12 0505  NA 133* 137  K 4.4 4.2  CL 97 100  CO2 22 27  GLUCOSE 111* 126*  BUN 18 14  CREATININE 0.82 0.84  CALCIUM 9.4 9.1    PT/INR:  Basename 12/02/12 0435  LABPROT 13.4  INR 1.03   ABG    Component Value Date/Time   PHART 7.422 12/01/2012 0425   HCO3 27.6* 12/02/2012 1345   TCO2 29 12/02/2012 1345   O2SAT 59.0 12/02/2012 1345   CBG (last 3)   Basename 12/03/12 1611 12/03/12 1119 12/03/12 0600  GLUCAP 105* 104* 128*    Assessment/Plan: S/P Procedure(s) (LRB): RIGHT HEART CATH (N/A) CABG in AM   LOS: 6 days    Kendra Todd,Kendra Todd 12/03/2012

## 2012-12-03 NOTE — Progress Notes (Signed)
ANTICOAGULATION CONSULT NOTE - Follow Up Consult  Pharmacy Consult for Heparin Indication: CAD awaiting TCTS evaluation for OHS  Allergies  Allergen Reactions  . Iron Hives  . Vicodin (Hydrocodone-Acetaminophen)   . Vioxx (Rofecoxib)     Patient Measurements: Height: 5\' 2"  (157.5 cm) Weight: 149 lb 14.4 oz (67.994 kg) IBW/kg (Calculated) : 50.1  Heparin Dosing Weight: 62.1 kg  Vital Signs: Temp: 98 F (36.7 C) (01/29 0436) Temp src: Oral (01/29 0436) BP: 157/6 mmHg (01/29 0436) Pulse Rate: 83  (01/29 0436)  Labs:  Basename 12/03/12 0505 12/02/12 0435 12/01/12 0440  HGB 9.4* 9.6* --  HCT 29.5* 29.4* 29.7*  PLT 234 215 227  APTT -- -- --  LABPROT -- 13.4 --  INR -- 1.03 --  HEPARINUNFRC 0.32 0.51 0.30  CREATININE 0.84 0.82 --  CKTOTAL -- -- --  CKMB -- -- --  TROPONINI -- -- --    Estimated Creatinine Clearance: 54 ml/min (by C-G formula based on Cr of 0.84).   Medications:  Scheduled:     . aspirin EC  81 mg Oral Daily  . atorvastatin  40 mg Oral q1800  . bisacodyl  5 mg Oral Once  . chlorhexidine  60 mL Topical Once  . [COMPLETED] fentaNYL      . [COMPLETED] heparin      . insulin aspart  0-15 Units Subcutaneous TID WC  . irbesartan  150 mg Oral BID  . isosorbide mononitrate  60 mg Oral Daily  . [COMPLETED] lidocaine      . metoprolol tartrate  100 mg Oral BID  . metoprolol tartrate  12.5 mg Oral Once  . [COMPLETED] midazolam      . sodium chloride  3 mL Intravenous Q12H  . [DISCONTINUED] budesonide-formoterol  2 puff Inhalation BID  . [DISCONTINUED] sodium chloride  3 mL Intravenous Q12H   Infusions:     . heparin 1,050 Units/hr (12/02/12 2046)  . [DISCONTINUED] sodium chloride 10 mL/hr (12/02/12 0759)  . [DISCONTINUED] heparin 1,050 Units/hr (12/02/12 1009)    Assessment: 72 yo F presented to ED 1/23 with CP.  Now s/p cath 1/24 showing multi-vessel disease, plan for OHS tomorrow 1/30   AM Heparin level is low-end therapeutic goal, Hgb/plts  are stable  Goal of Therapy:  Heparin level 0.3-0.7 units/ml   Plan:  Increase heparin to 1100 units/hr to maintain therapeutic levels We will continue follow up tomorrow after OHS  Bayard Hugger, PharmD, BCPS  Clinical Pharmacist  Pager: [phone]

## 2012-12-03 NOTE — Progress Notes (Signed)
Subjective: No complaints  Objective: Vital signs in last 24 hours: Temp:  [98 F (36.7 C)-98.6 F (37 C)] 98 F (36.7 C) (01/29 0436) Pulse Rate:  [54-83] 83  (01/29 0436) Resp:  [18-20] 20  (01/29 0436) BP: (123-180)/(6-109) 157/6 mmHg (01/29 0436) SpO2:  [10 %-100 %] 10 % (01/29 0436) Weight:  [67.994 kg (149 lb 14.4 oz)] 67.994 kg (149 lb 14.4 oz) (01/29 0436) Weight change: 0 kg (0 lb) Last BM Date: 12/02/12 Intake/Output from previous day: -349 01/28 0701 - 01/29 0700 In: 1093 [P.O.:600; I.V.:493] Out: 1200 [Urine:1200] Intake/Output this shift:    PE: General:alert and oriented pleasant female, no complaints Heart:S1S2 RRR Lungs:clear without rales, rhonchi or wheezes Abd:+ BS, soft, non tender Ext:no edema, Lt groin R HC site without hematoma    Lab Results:  Basename 12/03/12 0505 12/02/12 0435  WBC 7.9 8.1  HGB 9.4* 9.6*  HCT 29.5* 29.4*  PLT 234 215   BMET  Basename 12/03/12 0505 12/02/12 0435  NA 137 137  K 4.2 4.7  CL 100 102  CO2 27 27  GLUCOSE 126* 119*  BUN 14 12  CREATININE 0.84 0.82  CALCIUM 9.1 9.4   No results found for this basename: TROPONINI:2,CK,MB:2 in the last 72 hours  Lab Results  Component Value Date   CHOL 160 11/28/2012   HDL 49 11/28/2012   LDLCALC 99 11/28/2012   TRIG 59 11/28/2012   CHOLHDL 3.3 11/28/2012   Lab Results  Component Value Date   HGBA1C 6.8* 11/28/2012     Lab Results  Component Value Date   TSH 2.654 11/28/2012    Studies/Results: 12/02/12:  Rt. Heart Cath; HEMODYNAMICS:  RA: 12/11 initially; 14/13 on exit  RV: 56/12  PA: 55/21, mean 35 initially; 58/23, mean 40 on pullback  PC: a 37 v 29; mean 29  Fick CO: 4.78 CI:2.8  Thermo CO: 2.9 CI: 1.7  Elevated R heart pressures with moderate pulmonary hypertension.     Medications: I have reviewed the patient's current medications.    [name] aspirin EC  81 mg Oral Daily  . atorvastatin  40 mg Oral q1800  . bisacodyl  5 mg Oral Once  . chlorhexidine  60  mL Topical Once  . insulin aspart  0-15 Units Subcutaneous TID WC  . irbesartan  150 mg Oral BID  . isosorbide mononitrate  60 mg Oral Daily  . metoprolol tartrate  100 mg Oral BID  . metoprolol tartrate  12.5 mg Oral Once  . sodium chloride  3 mL Intravenous Q12H   Assessment/Plan: Principal Problem:  *NSTEMI (non-ST elevated myocardial infarction) Active Problems:  CAD (coronary artery disease): 1993 PTCA to circumflex.  2002 2 BMS stents to ostial, mid RCA now with severe multivessel CAD for possible CABG  TR (tricuspid regurgitation), severe 12/02/12 for repair with CABG  Pulmonary HTN  DM2 (diabetes mellitus, type 2)  HTN (hypertension)  HLD (hyperlipidemia)  PAD (peripheral artery disease)  PLAN: CABG with tricuspid repair 12/04/12  Glucose 209 to 112 stable. BP still elevated up to 169/62  avapro increased to 150 BID, metoprolol 100 BID, IMDUR 60 mg daily.  ? Add norvasc vs. Hydralazine?  LOS: 6 days   INGOLD,LAURA R 12/03/2012, 8:22 AM   Patient seen and examined. Agree with assessment and plan. No recurrent chest pain; ambulating in hall. BP 142/82 before walk. For CABG tomorrow with TV repair tomorrow.   Lennette Bihari, MD, [facility] 12/03/2012 10:06 AM

## 2012-12-03 NOTE — Progress Notes (Signed)
CARDIAC REHAB PHASE I   PRE:  Rate/Rhythm: 65SR  BP:  Supine:   Sitting: 142/82  Standing:    SaO2: 99%RA  MODE:  Ambulation: 460 ft   POST:  Rate/Rhythem: 70SR  BP:  Supine:   Sitting: 170/90  Standing:    SaO2: 99%RA [id number] Introduced self and Cardiac Rehab to pt. Discussed activity progression after surgery. Encouraged pt to practice sitting and getting up without using arms. Discussed sternal precautions. Pt states she can get 1250 ml on IS. Reading OHS booklet and will watch video today. Walked 460 ft with steady gait. No CP. Tolerated well. Will follow up after surgery.  Duanne Limerick

## 2012-12-04 ENCOUNTER — Encounter (HOSPITAL_COMMUNITY): Payer: Self-pay | Admitting: Anesthesiology

## 2012-12-04 ENCOUNTER — Inpatient Hospital Stay (HOSPITAL_COMMUNITY): Payer: Medicare Other | Admitting: Anesthesiology

## 2012-12-04 ENCOUNTER — Inpatient Hospital Stay (HOSPITAL_COMMUNITY): Payer: Medicare Other

## 2012-12-04 ENCOUNTER — Encounter (HOSPITAL_COMMUNITY)
Admission: EM | Disposition: A | Discharge status: Home / Self Care | Payer: Self-pay | Source: Home / Self Care | Attending: Cardiothoracic Surgery

## 2012-12-04 DIAGNOSIS — I2510 Atherosclerotic heart disease of native coronary artery without angina pectoris: Secondary | ICD-10-CM

## 2012-12-04 HISTORY — PX: INTRAOPERATIVE TRANSESOPHAGEAL ECHOCARDIOGRAM: SHX5062

## 2012-12-04 HISTORY — PX: CORONARY ARTERY BYPASS GRAFT: SHX141

## 2012-12-04 LAB — POCT I-STAT 3, ART BLOOD GAS (G3+)
Acid-base deficit: 2 mmol/L (ref 0.0–2.0)
Bicarbonate: 23.3 mEq/L (ref 20.0–24.0)
Bicarbonate: 22.7 mEq/L (ref 20.0–24.0)
Patient temperature: 37
TCO2: 26 mmol/L (ref 0–100)
TCO2: 27 mmol/L (ref 0–100)
pCO2 arterial: 36 mmHg (ref 35.0–45.0)
pH, Arterial: 7.445 (ref 7.350–7.450)
pH, Arterial: 7.461 — ABNORMAL HIGH (ref 7.350–7.450)
pH, Arterial: 7.506 — ABNORMAL HIGH (ref 7.350–7.450)
pO2, Arterial: 73 mmHg — ABNORMAL LOW (ref 80.0–100.0)

## 2012-12-04 LAB — POCT I-STAT 4, (NA,K, GLUC, HGB,HCT)
Glucose, Bld: 143 mg/dL — ABNORMAL HIGH (ref 70–99)
Glucose, Bld: 110 mg/dL — ABNORMAL HIGH (ref 70–99)
Glucose, Bld: 143 mg/dL — ABNORMAL HIGH (ref 70–99)
HCT: 24 % — ABNORMAL LOW (ref 36.0–46.0)
HCT: 24 % — ABNORMAL LOW (ref 36.0–46.0)
HCT: 25 % — ABNORMAL LOW (ref 36.0–46.0)
HCT: 24 % — ABNORMAL LOW (ref 36.0–46.0)
Hemoglobin: 8.2 g/dL — ABNORMAL LOW (ref 12.0–15.0)
Hemoglobin: 10.5 g/dL — ABNORMAL LOW (ref 12.0–15.0)
Potassium: 3.6 mEq/L (ref 3.5–5.1)
Potassium: 3.4 mEq/L — ABNORMAL LOW (ref 3.5–5.1)
Sodium: 135 mEq/L (ref 135–145)
Sodium: 140 mEq/L (ref 135–145)

## 2012-12-04 LAB — HEMOGLOBIN AND HEMATOCRIT, BLOOD
HCT: 24.4 % — ABNORMAL LOW (ref 36.0–46.0)
Hemoglobin: 8.4 g/dL — ABNORMAL LOW (ref 12.0–15.0)

## 2012-12-04 LAB — CBC
HCT: 29.6 % — ABNORMAL LOW (ref 36.0–46.0)
HCT: 31.6 % — ABNORMAL LOW (ref 36.0–46.0)
HCT: 27.6 % — ABNORMAL LOW (ref 36.0–46.0)
Hemoglobin: 9.5 g/dL — ABNORMAL LOW (ref 12.0–15.0)
Hemoglobin: 11 g/dL — ABNORMAL LOW (ref 12.0–15.0)
Hemoglobin: 9.5 g/dL — ABNORMAL LOW (ref 12.0–15.0)
MCH: 25.6 pg — ABNORMAL LOW (ref 26.0–34.0)
MCH: 27 pg (ref 26.0–34.0)
MCHC: 32.1 g/dL (ref 30.0–36.0)
MCHC: 34.4 g/dL (ref 30.0–36.0)
MCV: 78.2 fL (ref 78.0–100.0)
MCV: 78.4 fL (ref 78.0–100.0)
Platelets: 129 10*3/uL — ABNORMAL LOW (ref 150–400)
RBC: 3.71 MIL/uL — ABNORMAL LOW (ref 3.87–5.11)
RBC: 4.04 MIL/uL (ref 3.87–5.11)
RBC: 3.52 MIL/uL — ABNORMAL LOW (ref 3.87–5.11)
RDW: 15.1 % (ref 11.5–15.5)
WBC: 13.9 10*3/uL — ABNORMAL HIGH (ref 4.0–10.5)
WBC: 12.2 10*3/uL — ABNORMAL HIGH (ref 4.0–10.5)

## 2012-12-04 LAB — POCT I-STAT, CHEM 8
BUN: 9 mg/dL (ref 6–23)
Calcium, Ion: 1.1 mmol/L — ABNORMAL LOW (ref 1.13–1.30)
Hemoglobin: 8.8 g/dL — ABNORMAL LOW (ref 12.0–15.0)
Sodium: 140 mEq/L (ref 135–145)
TCO2: 22 mmol/L (ref 0–100)

## 2012-12-04 LAB — POCT I-STAT GLUCOSE
Glucose, Bld: 133 mg/dL — ABNORMAL HIGH (ref 70–99)
Operator id: 132841

## 2012-12-04 LAB — HEPARIN LEVEL (UNFRACTIONATED): Heparin Unfractionated: 0.48 IU/mL (ref 0.30–0.70)

## 2012-12-04 LAB — CREATININE, SERUM
Creatinine, Ser: 0.67 mg/dL (ref 0.50–1.10)
GFR calc Af Amer: >90 mL/min (ref >90)
GFR calc non Af Amer: 85 mL/min — ABNORMAL LOW (ref >90)

## 2012-12-04 LAB — CARBOXYHEMOGLOBIN
Carboxyhemoglobin: 1.1 % (ref 0.5–1.5)
Methemoglobin: 1.3 % (ref 0.0–1.5)
O2 Saturation: 69.9 %
Total hemoglobin: 10 g/dL — ABNORMAL LOW (ref 12.0–16.0)

## 2012-12-04 LAB — MAGNESIUM: Magnesium: 3.2 mg/dL — ABNORMAL HIGH (ref 1.5–2.5)

## 2012-12-04 LAB — PLATELET COUNT: Platelets: 137 10*3/uL — ABNORMAL LOW (ref 150–400)

## 2012-12-04 LAB — APTT
aPTT: 99 seconds — ABNORMAL HIGH (ref 24–37)
aPTT: 35 seconds (ref 24–37)

## 2012-12-04 LAB — GLUCOSE, CAPILLARY: Glucose-Capillary: 131 mg/dL — ABNORMAL HIGH (ref 70–99)

## 2012-12-04 SURGERY — CORONARY ARTERY BYPASS GRAFTING (CABG)
Anesthesia: General | Site: Chest | Wound class: Clean

## 2012-12-04 MED ORDER — SODIUM CHLORIDE 0.9 % IR SOLN
Status: DC | PRN
  Administered 2012-12-04: 1000 mL

## 2012-12-04 MED ORDER — ACETAMINOPHEN 160 MG/5ML PO SOLN
975.0000 mg | Freq: Four times a day (QID) | ORAL | Status: DC
  Administered 2012-12-04: 975 mg
  Filled 2012-12-04: qty 40.6

## 2012-12-04 MED ORDER — PANTOPRAZOLE SODIUM 40 MG PO TBEC
40.0000 mg | DELAYED_RELEASE_TABLET | Freq: Every day | ORAL | Status: DC
  Administered 2012-12-06 – 2012-12-11 (×6): 40 mg via ORAL
  Filled 2012-12-04 (×6): qty 1

## 2012-12-04 MED ORDER — LACTATED RINGERS IV SOLN: INTRAVENOUS | Status: DC

## 2012-12-04 MED ORDER — SODIUM CHLORIDE 0.45 % IV SOLN
INTRAVENOUS | Status: DC
  Administered 2012-12-04: 14:00:00 via INTRAVENOUS
  Administered 2012-12-05: 20 mL/h via INTRAVENOUS

## 2012-12-04 MED ORDER — FENTANYL CITRATE 0.05 MG/ML IJ SOLN: 50.0000 ug | INTRAMUSCULAR | Status: DC | PRN

## 2012-12-04 MED ORDER — SODIUM CHLORIDE 0.9 % IV SOLN
INTRAVENOUS | Status: DC | PRN
  Administered 2012-12-04: 12:00:00 via INTRAVENOUS

## 2012-12-04 MED ORDER — MIDAZOLAM HCL 2 MG/2ML IJ SOLN: 1.0000 mg | INTRAMUSCULAR | Status: DC | PRN

## 2012-12-04 MED ORDER — MORPHINE SULFATE 2 MG/ML IJ SOLN
2.0000 mg | INTRAMUSCULAR | Status: DC | PRN
  Administered 2012-12-05 (×4): 2 mg via INTRAVENOUS
  Administered 2012-12-05: 4 mg via INTRAVENOUS
  Administered 2012-12-06 – 2012-12-07 (×5): 2 mg via INTRAVENOUS
  Filled 2012-12-04: qty 2
  Filled 2012-12-04 (×3): qty 1
  Filled 2012-12-04: qty 2
  Filled 2012-12-04 (×3): qty 1

## 2012-12-04 MED ORDER — POTASSIUM CHLORIDE 10 MEQ/50ML IV SOLN
10.0000 meq | INTRAVENOUS | Status: AC
  Administered 2012-12-04 (×3): 10 meq via INTRAVENOUS

## 2012-12-04 MED ORDER — MAGNESIUM SULFATE 40 MG/ML IJ SOLN
4.0000 g | Freq: Once | INTRAMUSCULAR | Status: AC
  Administered 2012-12-04: 4 g via INTRAVENOUS
  Filled 2012-12-04: qty 100

## 2012-12-04 MED ORDER — ASPIRIN EC 325 MG PO TBEC
325.0000 mg | DELAYED_RELEASE_TABLET | Freq: Every day | ORAL | Status: DC
  Administered 2012-12-05 – 2012-12-11 (×7): 325 mg via ORAL
  Filled 2012-12-04 (×7): qty 1

## 2012-12-04 MED ORDER — ASPIRIN 81 MG PO CHEW: 324.0000 mg | CHEWABLE_TABLET | Freq: Every day | ORAL | Status: DC

## 2012-12-04 MED ORDER — SODIUM CHLORIDE 0.9 % IV SOLN
INTRAVENOUS | Status: DC
  Administered 2012-12-04: 14:00:00 via INTRAVENOUS
  Administered 2012-12-04 – 2012-12-05 (×3): 20 mL/h via INTRAVENOUS

## 2012-12-04 MED ORDER — VECURONIUM BROMIDE 10 MG IV SOLR
INTRAVENOUS | Status: DC | PRN
  Administered 2012-12-04 (×3): 5 mg via INTRAVENOUS

## 2012-12-04 MED ORDER — FENTANYL CITRATE 0.05 MG/ML IJ SOLN
INTRAMUSCULAR | Status: DC | PRN
  Administered 2012-12-04 (×2): 50 ug via INTRAVENOUS

## 2012-12-04 MED ORDER — MILRINONE IN DEXTROSE 20 MG/100ML IV SOLN
INTRAVENOUS | Status: DC | PRN
  Administered 2012-12-04: 0.375 ug/kg/min via INTRAVENOUS

## 2012-12-04 MED ORDER — PHENYLEPHRINE HCL 10 MG/ML IJ SOLN
0.0000 ug/min | INTRAVENOUS | Status: DC
  Filled 2012-12-04: qty 2

## 2012-12-04 MED ORDER — ACETAMINOPHEN 10 MG/ML IV SOLN
1000.0000 mg | Freq: Once | INTRAVENOUS | Status: AC
  Administered 2012-12-04: 1000 mg via INTRAVENOUS
  Filled 2012-12-04: qty 100

## 2012-12-04 MED ORDER — SODIUM CHLORIDE 0.9 % IV SOLN: 250.0000 mL | INTRAVENOUS | Status: DC | PRN

## 2012-12-04 MED ORDER — PROTAMINE SULFATE 10 MG/ML IV SOLN
INTRAVENOUS | Status: DC | PRN
  Administered 2012-12-04 (×3): 25 mg via INTRAVENOUS
  Administered 2012-12-04: 60 mg via INTRAVENOUS
  Administered 2012-12-04: 25 mg via INTRAVENOUS
  Administered 2012-12-04 (×2): 20 mg via INTRAVENOUS

## 2012-12-04 MED ORDER — HEPARIN SODIUM (PORCINE) 1000 UNIT/ML IJ SOLN
INTRAMUSCULAR | Status: DC | PRN
  Administered 2012-12-04: 16000 [IU] via INTRAVENOUS
  Administered 2012-12-04: 5000 [IU] via INTRAVENOUS
  Administered 2012-12-04: 2000 [IU] via INTRAVENOUS

## 2012-12-04 MED ORDER — LIDOCAINE HCL (CARDIAC) 20 MG/ML IV SOLN
INTRAVENOUS | Status: DC | PRN
  Administered 2012-12-04: 50 mg via INTRAVENOUS

## 2012-12-04 MED ORDER — DEXTROSE 5 % IV SOLN
1.5000 g | Freq: Two times a day (BID) | INTRAVENOUS | Status: AC
  Administered 2012-12-04 – 2012-12-06 (×4): 1.5 g via INTRAVENOUS
  Filled 2012-12-04 (×4): qty 1.5

## 2012-12-04 MED ORDER — LACTATED RINGERS IV SOLN
INTRAVENOUS | Status: DC | PRN
  Administered 2012-12-04: 06:00:00 via INTRAVENOUS

## 2012-12-04 MED ORDER — INSULIN REGULAR BOLUS VIA INFUSION
0.0000 [IU] | Freq: Three times a day (TID) | INTRAVENOUS | Status: DC
  Filled 2012-12-04: qty 10

## 2012-12-04 MED ORDER — ONDANSETRON HCL 4 MG/2ML IJ SOLN
4.0000 mg | Freq: Four times a day (QID) | INTRAMUSCULAR | Status: DC | PRN
  Administered 2012-12-05 – 2012-12-08 (×2): 4 mg via INTRAVENOUS
  Filled 2012-12-04 (×2): qty 2

## 2012-12-04 MED ORDER — FAMOTIDINE IN NACL 20-0.9 MG/50ML-% IV SOLN
20.0000 mg | Freq: Two times a day (BID) | INTRAVENOUS | Status: AC
  Administered 2012-12-04 (×2): 20 mg via INTRAVENOUS
  Filled 2012-12-04: qty 50

## 2012-12-04 MED ORDER — NITROPRUSSIDE SODIUM 25 MG/ML IV SOLN
0.0000 ug/kg/min | INTRAVENOUS | Status: DC
  Filled 2012-12-04: qty 2

## 2012-12-04 MED ORDER — ALBUMIN HUMAN 5 % IV SOLN
250.0000 mL | INTRAVENOUS | Status: AC | PRN
  Administered 2012-12-04 (×4): 250 mL via INTRAVENOUS
  Filled 2012-12-04: qty 500

## 2012-12-04 MED ORDER — BISACODYL 5 MG PO TBEC
10.0000 mg | DELAYED_RELEASE_TABLET | Freq: Every day | ORAL | Status: DC
  Administered 2012-12-05 – 2012-12-10 (×4): 10 mg via ORAL
  Filled 2012-12-04 (×3): qty 2

## 2012-12-04 MED ORDER — DEXMEDETOMIDINE HCL IN NACL 200 MCG/50ML IV SOLN
0.1000 ug/kg/h | INTRAVENOUS | Status: DC
  Administered 2012-12-04: 0.3 ug/kg/h via INTRAVENOUS
  Filled 2012-12-04: qty 50

## 2012-12-04 MED ORDER — METOPROLOL TARTRATE 1 MG/ML IV SOLN
2.5000 mg | INTRAVENOUS | Status: DC | PRN
  Administered 2012-12-07: 2.5 mg via INTRAVENOUS
  Administered 2012-12-07: 5 mg via INTRAVENOUS
  Administered 2012-12-07: 2.5 mg via INTRAVENOUS
  Filled 2012-12-04 (×2): qty 5

## 2012-12-04 MED ORDER — FENTANYL CITRATE 0.05 MG/ML IJ SOLN
INTRAMUSCULAR | Status: AC
  Filled 2012-12-04: qty 2

## 2012-12-04 MED ORDER — ACETAMINOPHEN 500 MG PO TABS
1000.0000 mg | ORAL_TABLET | Freq: Four times a day (QID) | ORAL | Status: AC
  Administered 2012-12-05 – 2012-12-09 (×17): 1000 mg via ORAL
  Filled 2012-12-04 (×8): qty 2
  Filled 2012-12-04: qty 1
  Filled 2012-12-04 (×12): qty 2

## 2012-12-04 MED ORDER — MIDAZOLAM HCL 2 MG/2ML IJ SOLN: 2.0000 mg | INTRAMUSCULAR | Status: DC | PRN

## 2012-12-04 MED ORDER — PROPOFOL 10 MG/ML IV BOLUS
INTRAVENOUS | Status: DC | PRN
  Administered 2012-12-04: 70 mg via INTRAVENOUS

## 2012-12-04 MED ORDER — BISACODYL 10 MG RE SUPP: 10.0000 mg | Freq: Every day | RECTAL | Status: DC

## 2012-12-04 MED ORDER — MILRINONE IN DEXTROSE 20 MG/100ML IV SOLN
0.3000 ug/kg/min | INTRAVENOUS | Status: DC
  Administered 2012-12-04: 0.3 ug/kg/min via INTRAVENOUS
  Filled 2012-12-04: qty 100

## 2012-12-04 MED ORDER — MIDAZOLAM HCL 2 MG/2ML IJ SOLN
INTRAMUSCULAR | Status: AC
  Filled 2012-12-04: qty 2

## 2012-12-04 MED ORDER — HEMOSTATIC AGENTS (NO CHARGE) OPTIME
TOPICAL | Status: DC | PRN
  Administered 2012-12-04: 1 via TOPICAL

## 2012-12-04 MED ORDER — NITROGLYCERIN IN D5W 200-5 MCG/ML-% IV SOLN: 0.0000 ug/min | INTRAVENOUS | Status: DC

## 2012-12-04 MED ORDER — SODIUM CHLORIDE 0.9 % IJ SOLN
3.0000 mL | Freq: Two times a day (BID) | INTRAMUSCULAR | Status: DC
  Administered 2012-12-05 – 2012-12-08 (×7): 3 mL via INTRAVENOUS

## 2012-12-04 MED ORDER — ALBUMIN HUMAN 5 % IV SOLN: 12.5000 g | Freq: Once | INTRAVENOUS | Status: DC

## 2012-12-04 MED ORDER — MIDAZOLAM HCL 5 MG/5ML IJ SOLN
INTRAMUSCULAR | Status: DC | PRN
  Administered 2012-12-04 (×2): 2 mg via INTRAVENOUS
  Administered 2012-12-04: 4 mg via INTRAVENOUS
  Administered 2012-12-04 (×2): 2 mg via INTRAVENOUS

## 2012-12-04 MED ORDER — DOBUTAMINE IN D5W 4-5 MG/ML-% IV SOLN
3.0000 ug/kg/min | INTRAVENOUS | Status: DC
  Administered 2012-12-04: 3 ug/kg/min via INTRAVENOUS
  Filled 2012-12-04: qty 250

## 2012-12-04 MED ORDER — LACTATED RINGERS IV SOLN: 500.0000 mL | Freq: Once | INTRAVENOUS | Status: AC | PRN

## 2012-12-04 MED ORDER — METOPROLOL TARTRATE 25 MG/10 ML ORAL SUSPENSION
12.5000 mg | Freq: Two times a day (BID) | ORAL | Status: DC
  Filled 2012-12-04 (×3): qty 5

## 2012-12-04 MED ORDER — SODIUM CHLORIDE 0.9 % IV SOLN
INTRAVENOUS | Status: DC
  Administered 2012-12-05: 14:00:00 via INTRAVENOUS
  Administered 2012-12-05: 3.6 [IU]/h via INTRAVENOUS
  Administered 2012-12-05 (×2): 3.2 [IU]/h via INTRAVENOUS
  Administered 2012-12-05: 07:00:00 via INTRAVENOUS
  Filled 2012-12-04 (×2): qty 1

## 2012-12-04 MED ORDER — VANCOMYCIN HCL IN DEXTROSE 1-5 GM/200ML-% IV SOLN
1000.0000 mg | Freq: Once | INTRAVENOUS | Status: AC
  Administered 2012-12-04: 1000 mg via INTRAVENOUS
  Filled 2012-12-04: qty 200

## 2012-12-04 MED ORDER — GLYCOPYRROLATE 0.2 MG/ML IJ SOLN
INTRAMUSCULAR | Status: DC | PRN
  Administered 2012-12-04: 0.2 mg via INTRAVENOUS

## 2012-12-04 MED ORDER — FENTANYL CITRATE 0.05 MG/ML IJ SOLN
INTRAMUSCULAR | Status: DC | PRN
  Administered 2012-12-04 (×5): 250 ug via INTRAVENOUS

## 2012-12-04 MED ORDER — MILRINONE IN DEXTROSE 20 MG/100ML IV SOLN
0.1250 ug/kg/min | INTRAVENOUS | Status: DC
  Filled 2012-12-04: qty 100

## 2012-12-04 MED ORDER — METOPROLOL TARTRATE 12.5 MG HALF TABLET
12.5000 mg | ORAL_TABLET | Freq: Two times a day (BID) | ORAL | Status: DC
  Filled 2012-12-04 (×3): qty 1

## 2012-12-04 MED ORDER — MORPHINE SULFATE 2 MG/ML IJ SOLN
1.0000 mg | INTRAMUSCULAR | Status: AC | PRN
  Filled 2012-12-04: qty 1

## 2012-12-04 MED ORDER — SODIUM CHLORIDE 0.9 % IJ SOLN
OROMUCOSAL | Status: DC | PRN
  Administered 2012-12-04: 09:00:00 via TOPICAL

## 2012-12-04 MED ORDER — ROCURONIUM BROMIDE 100 MG/10ML IV SOLN
INTRAVENOUS | Status: DC | PRN
  Administered 2012-12-04: 50 mg via INTRAVENOUS

## 2012-12-04 MED ORDER — SODIUM CHLORIDE 0.9 % IJ SOLN
3.0000 mL | INTRAMUSCULAR | Status: DC | PRN
  Administered 2012-12-05 – 2012-12-08 (×2): 3 mL via INTRAVENOUS

## 2012-12-04 MED ORDER — DOCUSATE SODIUM 100 MG PO CAPS
200.0000 mg | ORAL_CAPSULE | Freq: Every day | ORAL | Status: DC
  Administered 2012-12-05 – 2012-12-11 (×6): 200 mg via ORAL
  Filled 2012-12-04 (×6): qty 2

## 2012-12-04 MED FILL — Magnesium Sulfate Inj 50%: INTRAMUSCULAR | Qty: 10 | Status: AC

## 2012-12-04 MED FILL — Potassium Chloride Inj 2 mEq/ML: INTRAVENOUS | Qty: 40 | Status: AC

## 2012-12-04 SURGICAL SUPPLY — 137 items
ADAPTER CARDIO PERF ANTE/RETRO (ADAPTER) ×8 IMPLANT
APPLICATOR COTTON TIP 6IN STRL (MISCELLANEOUS) IMPLANT
ATTRACTOMAT 16X20 MAGNETIC DRP (DRAPES) ×4 IMPLANT
BAG DECANTER FOR FLEXI CONT (MISCELLANEOUS) ×4 IMPLANT
BANDAGE ELASTIC 4 VELCRO ST LF (GAUZE/BANDAGES/DRESSINGS) ×4 IMPLANT
BANDAGE ELASTIC 6 VELCRO ST LF (GAUZE/BANDAGES/DRESSINGS) ×4 IMPLANT
BANDAGE GAUZE ELAST BULKY 4 IN (GAUZE/BANDAGES/DRESSINGS) ×4 IMPLANT
BASKET HEART  (ORDER IN 25'S) (MISCELLANEOUS) ×1
BASKET HEART (ORDER IN 25'S) (MISCELLANEOUS) ×1
BASKET HEART (ORDER IN 25S) (MISCELLANEOUS) ×2 IMPLANT
BLADE STERNUM SYSTEM 6 (BLADE) ×4 IMPLANT
BLADE SURG 11 STRL SS (BLADE) ×4 IMPLANT
BLADE SURG 12 STRL SS (BLADE) ×4 IMPLANT
BLADE SURG 15 STRL LF DISP TIS (BLADE) ×2 IMPLANT
BLADE SURG 15 STRL SS (BLADE) ×2
BLADE SURG ROTATE 9660 (MISCELLANEOUS) IMPLANT
BOOT SUTURE AID YELLOW STND (SUTURE) IMPLANT
CANISTER SUCTION 2500CC (MISCELLANEOUS) ×4 IMPLANT
CANNULA AORTIC HI-FLOW 6.5M20F (CANNULA) ×4 IMPLANT
CANNULA AORTIC ROOT 20012 (MISCELLANEOUS) ×4 IMPLANT
CANNULA GUNDRY RCSP 15FR (MISCELLANEOUS) ×4 IMPLANT
CANNULA VENOUS MAL SGL STG 40 (MISCELLANEOUS) IMPLANT
CANNULAE VENOUS MAL SGL STG 40 (MISCELLANEOUS)
CATH CPB KIT VANTRIGT (MISCELLANEOUS) ×4 IMPLANT
CATH RETROPLEGIA CORONARY 14FR (CATHETERS) IMPLANT
CATH ROBINSON RED A/P 18FR (CATHETERS) ×12 IMPLANT
CATH THORACIC 28FR (CATHETERS) IMPLANT
CATH THORACIC 28FR RT ANG (CATHETERS) IMPLANT
CATH THORACIC 36FR (CATHETERS) IMPLANT
CATH THORACIC 36FR RT ANG (CATHETERS) ×8 IMPLANT
CLIP FOGARTY SPRING 6M (CLIP) IMPLANT
CLIP TI WIDE RED SMALL 24 (CLIP) ×4 IMPLANT
CLOTH BEACON ORANGE TIMEOUT ST (SAFETY) ×4 IMPLANT
CONN 1/2X1/2X1/2  BEN (MISCELLANEOUS) ×2
CONN 1/2X1/2X1/2 BEN (MISCELLANEOUS) ×2 IMPLANT
CONN 3/8X1/2 ST GISH (MISCELLANEOUS) ×8 IMPLANT
COVER SURGICAL LIGHT HANDLE (MISCELLANEOUS) ×4 IMPLANT
CRADLE DONUT ADULT HEAD (MISCELLANEOUS) ×4 IMPLANT
DERMABOND ADVANCED (GAUZE/BANDAGES/DRESSINGS) ×2
DERMABOND ADVANCED .7 DNX12 (GAUZE/BANDAGES/DRESSINGS) ×2 IMPLANT
DRAIN CHANNEL 32F RND 10.7 FF (WOUND CARE) ×4 IMPLANT
DRAPE CARDIOVASCULAR INCISE (DRAPES) ×2
DRAPE SLUSH MACHINE 52X66 (DRAPES) IMPLANT
DRAPE SLUSH/WARMER DISC (DRAPES) ×4 IMPLANT
DRAPE SRG 135X102X78XABS (DRAPES) ×2 IMPLANT
DRSG COVADERM 4X14 (GAUZE/BANDAGES/DRESSINGS) ×4 IMPLANT
ELECT BLADE 4.0 EZ CLEAN MEGAD (MISCELLANEOUS) ×4
ELECT BLADE 6.5 EXT (BLADE) ×4 IMPLANT
ELECT CAUTERY BLADE 6.4 (BLADE) ×4 IMPLANT
ELECT REM PT RETURN 9FT ADLT (ELECTROSURGICAL) ×8
ELECTRODE BLDE 4.0 EZ CLN MEGD (MISCELLANEOUS) ×2 IMPLANT
ELECTRODE REM PT RTRN 9FT ADLT (ELECTROSURGICAL) ×4 IMPLANT
GLOVE BIO SURGEON STRL SZ 6 (GLOVE) IMPLANT
GLOVE BIO SURGEON STRL SZ 6.5 (GLOVE) ×6 IMPLANT
GLOVE BIO SURGEON STRL SZ7 (GLOVE) IMPLANT
GLOVE BIO SURGEON STRL SZ7.5 (GLOVE) ×8 IMPLANT
GLOVE BIO SURGEON STRL SZ8.5 (GLOVE) ×4 IMPLANT
GLOVE BIO SURGEONS STRL SZ 6.5 (GLOVE) ×2
GLOVE BIOGEL PI IND STRL 6 (GLOVE) ×6 IMPLANT
GLOVE BIOGEL PI IND STRL 7.0 (GLOVE) ×12 IMPLANT
GLOVE BIOGEL PI INDICATOR 6 (GLOVE) ×6
GLOVE BIOGEL PI INDICATOR 7.0 (GLOVE) ×12
GOWN STRL NON-REIN LRG LVL3 (GOWN DISPOSABLE) ×32 IMPLANT
HEMOSTAT POWDER SURGIFOAM 1G (HEMOSTASIS) ×12 IMPLANT
HEMOSTAT SURGICEL 2X14 (HEMOSTASIS) ×4 IMPLANT
INSERT FOGARTY XLG (MISCELLANEOUS) IMPLANT
KIT BASIN OR (CUSTOM PROCEDURE TRAY) ×4 IMPLANT
KIT PAIN CUSTOM (MISCELLANEOUS) IMPLANT
KIT ROOM TURNOVER OR (KITS) ×4 IMPLANT
KIT SUCTION CATH 14FR (SUCTIONS) ×8 IMPLANT
KIT VASOVIEW W/TROCAR VH 2000 (KITS) ×4 IMPLANT
LEAD PACING MYOCARDI (MISCELLANEOUS) ×4 IMPLANT
LINE VENT (MISCELLANEOUS) ×4 IMPLANT
LOOP VESSEL SUPERMAXI WHITE (MISCELLANEOUS) ×4 IMPLANT
MARKER GRAFT CORONARY BYPASS (MISCELLANEOUS) ×12 IMPLANT
MATRIX HEMOSTAT SURGIFLO (HEMOSTASIS) ×4 IMPLANT
NS IRRIG 1000ML POUR BTL (IV SOLUTION) ×20 IMPLANT
PACK OPEN HEART (CUSTOM PROCEDURE TRAY) ×4 IMPLANT
PAD ARMBOARD 7.5X6 YLW CONV (MISCELLANEOUS) ×8 IMPLANT
PEDIATRIC SUCKERS (MISCELLANEOUS) IMPLANT
PENCIL BUTTON HOLSTER BLD 10FT (ELECTRODE) ×4 IMPLANT
PUNCH AORTIC ROTATE 4.0MM (MISCELLANEOUS) IMPLANT
PUNCH AORTIC ROTATE 4.5MM 8IN (MISCELLANEOUS) IMPLANT
PUNCH AORTIC ROTATE 5MM 8IN (MISCELLANEOUS) IMPLANT
SET CARDIOPLEGIA MPS 5001102 (MISCELLANEOUS) ×4 IMPLANT
SPONGE GAUZE 4X4 12PLY (GAUZE/BANDAGES/DRESSINGS) IMPLANT
SUCKER WEIGHTED FLEX (MISCELLANEOUS) IMPLANT
SUT BONE WAX W31G (SUTURE) ×4 IMPLANT
SUT ETHIBOND 2 0 SH (SUTURE) ×2 IMPLANT
SUT ETHIBOND 2 0 SH 36X2 (SUTURE) ×2 IMPLANT
SUT ETHIBOND 2 0 V4 (SUTURE) IMPLANT
SUT ETHIBOND 2 0V4 GREEN (SUTURE) IMPLANT
SUT ETHIBOND 4 0 TF (SUTURE) IMPLANT
SUT ETHIBOND 5 0 C 1 30 (SUTURE) IMPLANT
SUT MNCRL AB 4-0 PS2 18 (SUTURE) ×4 IMPLANT
SUT PROLENE 3 0 RB 1 (SUTURE) ×4 IMPLANT
SUT PROLENE 3 0 SH 1 (SUTURE) IMPLANT
SUT PROLENE 3 0 SH DA (SUTURE) IMPLANT
SUT PROLENE 3 0 SH1 36 (SUTURE) IMPLANT
SUT PROLENE 4 0 RB 1 (SUTURE) ×2
SUT PROLENE 4 0 SH DA (SUTURE) ×4 IMPLANT
SUT PROLENE 4-0 RB1 .5 CRCL 36 (SUTURE) ×2 IMPLANT
SUT PROLENE 5 0 C 1 36 (SUTURE) ×4 IMPLANT
SUT PROLENE 6 0 C 1 30 (SUTURE) ×12 IMPLANT
SUT PROLENE 7 0 DA (SUTURE) IMPLANT
SUT PROLENE 7.0 RB 3 (SUTURE) ×12 IMPLANT
SUT PROLENE 8 0 BV175 6 (SUTURE) ×8 IMPLANT
SUT PROLENE BLUE 7 0 (SUTURE) ×4 IMPLANT
SUT PROLENE POLY MONO (SUTURE) ×8 IMPLANT
SUT SILK  1 MH (SUTURE)
SUT SILK 1 MH (SUTURE) IMPLANT
SUT SILK 1 TIES 10X30 (SUTURE) IMPLANT
SUT SILK 2 0 SH CR/8 (SUTURE) IMPLANT
SUT SILK 3 0 SH CR/8 (SUTURE) IMPLANT
SUT STEEL 6MS V (SUTURE) ×8 IMPLANT
SUT STEEL STERNAL CCS#1 18IN (SUTURE) IMPLANT
SUT STEEL SZ 6 DBL 3X14 BALL (SUTURE) ×4 IMPLANT
SUT VIC AB 1 CTX 18 (SUTURE) IMPLANT
SUT VIC AB 1 CTX 36 (SUTURE) ×8
SUT VIC AB 1 CTX36XBRD ANBCTR (SUTURE) ×8 IMPLANT
SUT VIC AB 2-0 CT1 27 (SUTURE)
SUT VIC AB 2-0 CT1 TAPERPNT 27 (SUTURE) IMPLANT
SUT VIC AB 2-0 CTX 27 (SUTURE) IMPLANT
SUT VIC AB 3-0 SH 27 (SUTURE) ×2
SUT VIC AB 3-0 SH 27X BRD (SUTURE) ×2 IMPLANT
SUT VIC AB 3-0 X1 27 (SUTURE) IMPLANT
SUT VICRYL 4-0 PS2 18IN ABS (SUTURE) IMPLANT
SUTURE E-PAK OPEN HEART (SUTURE) ×4 IMPLANT
SYSTEM SAHARA CHEST DRAIN ATS (WOUND CARE) ×4 IMPLANT
TAPE CLOTH SURG 4X10 WHT LF (GAUZE/BANDAGES/DRESSINGS) ×4 IMPLANT
TAPE PAPER 2X10 WHT MICROPORE (GAUZE/BANDAGES/DRESSINGS) ×4 IMPLANT
TOWEL OR 17X24 6PK STRL BLUE (TOWEL DISPOSABLE) ×8 IMPLANT
TOWEL OR 17X26 10 PK STRL BLUE (TOWEL DISPOSABLE) ×8 IMPLANT
TRAY FOLEY IC TEMP SENS 14FR (CATHETERS) ×4 IMPLANT
TUBING INSUFFLATION 10FT LAP (TUBING) ×4 IMPLANT
UNDERPAD 30X30 INCONTINENT (UNDERPADS AND DIAPERS) ×4 IMPLANT
WATER STERILE IRR 1000ML POUR (IV SOLUTION) ×8 IMPLANT

## 2012-12-04 NOTE — Brief Op Note (Signed)
11/27/2012 - 12/04/2012  11:23 AM  PATIENT:  Kendra Todd  72 y.o. female  PRE-OPERATIVE DIAGNOSIS: 1. History of coronary artery disease (s/p PTCA of circumflex) and now with multivessel coronary artery disease (left main included) 2.s/p NSTEMI 3.Moderate to severe TR  POST-OPERATIVE DIAGNOSIS: 1. History of coronary artery disease (s/p PTCA of circumflex) and now with multivessel coronary artery disease (left main included) 2.s/p NSTEMI 3.Mild to moderate TR  4.Mild MR  PROCEDURE:  INTRAOPERATIVE TRANSESOPHAGEAL ECHOCARDIOGRAM, CORONARY ARTERY BYPASS GRAFTING (CABG) x 3 (LIMA to LAD, SVG to OM, and SVG to RCA) with EVH from right thigh. Diagonal too small to graft I  SURGEON:  Surgeon(s) and Role:    * Kerin Perna, MD - Primary  PHYSICIAN ASSISTANT: Doree Fudge PA-C  ANESTHESIA:   general  EBL:  Total I/O In: -  Out: 1000 [Urine:1000]   DRAINS:  Chest Tube(s) in the Mediastinal and pleural spaces   COUNTS CORRECT:  YES  DICTATION: .Dragon Dictation  PLAN OF CARE: Admit to inpatient   PATIENT DISPOSITION:  ICU - intubated and hemodynamically stable.   Delay start of Pharmacological VTE agent (>24hrs) due to surgical blood loss or risk of bleeding: yes  PRE OP WEIGHT: 68 kg

## 2012-12-04 NOTE — Op Note (Signed)
Kendra Todd, Kendra Todd                   ACCOUNT NO.:  [id number]  MEDICAL RECORD NO.:  [id number]  LOCATION:  2309                         FACILITY:  [facility]  PHYSICIAN:  Bedelia Person, M.D.        DATE OF BIRTH:  [birth year]  DATE OF PROCEDURE:  12/04/2012 DATE OF DISCHARGE:                              OPERATIVE REPORT   PROCEDURE:  Intraoperative transesophageal echocardiography.  [name] is an elderly female with a known history of coronary artery disease and tricuspid insufficiency.  The TEE will be used intraoperatively to assess the tricuspid valve for possible repair as well as left ventricular function pre and post bypass.  The patient has no history of esophageal or gastric pathology.  After induction with general anesthesia, the airway was secured with an oral endotracheal tube.  Oral gastric tube was used to decompress any stomach contents.  The transesophageal transducer was heavily lubricated and placed down the oropharynx with no significant resistance.  At the completion of the procedure, probe was removed.  There was no evidence of oral or pharyngeal damage during the bypass.  The probe was left in a neutral and flexed position.  Prebypass examination revealed the left ventricle, which showed good contractility.  It was concentrically hypertrophied.  There were no segmental defects.  The mitral valve exhibited very mild prolapse. There was some calcification noted.  The valve was somewhat thickened. Did coapt in the valvular plane.  Color Doppler revealed a central well- defined regurgitant jet that extended well into the left atrium.  There was no evidence of reversible flow in the pulmonary veins.  The localization of the reflux appeared to be around the P2 position.  The atrial septum was intact, and the atrial appendage was clean.  The aortic valve had 3 leaflets, all opening and closing appropriately. There were no masses or vegetations noted.  The valve had 3  leaflets. Color Doppler revealed no aortic insufficiency.  The right heart examination revealed an enlarged right atrium.  There was good contractility of the right ventricle.  The tricuspid valve appeared normal.  There was no prolapse noted.  Swan-Ganz catheter could be noted across the valve.  Color Doppler did reveal a moderately broad-based jet into the right atrium.  It did not fill the atrium.  No other abnormalities were detected.  The patient was placed on cardiopulmonary bypass and underwent coronary artery bypass grafting at the completion of the procedure.  The patient was on milrinone.  The left ventricle now appeared dynamic in contractility.  No new segmental defects were detected.  Mitral valve function, mitral valve appearance was essentially unchanged from prebypass examination.  Color Doppler did reveal less mitral regurgitant flow.  There was now a trace central regurgitant flow in the well-defined jet.  Right heart examination remained basically unchanged from the prebypass examination with mild to moderate tricuspid regurg, again appearing in a broad-based jet.  There were no new findings from the prebypass examination.         ______________________________ Bedelia Person, M.D.    LK/MEDQ  D:  12/04/2012  T:  12/04/2012  Job:  [id number]

## 2012-12-04 NOTE — Transfer of Care (Signed)
Immediate Anesthesia Transfer of Care Note  [name]  Procedure(s) Performed: Procedure(s) (LRB) with comments: CORONARY ARTERY BYPASS GRAFTING (CABG) (N/A) INTRAOPERATIVE TRANSESOPHAGEAL ECHOCARDIOGRAM (N/A)  Patient Location: PACU and SICU  Anesthesia Type:General  Level of Consciousness: sedated and unresponsive  Airway & Oxygen Therapy: Patient remains intubated per anesthesia plan  Post-op Assessment: Report given to PACU RN and Post -op Vital signs reviewed and stable  Post vital signs: Reviewed and stable  Complications: No apparent anesthesia complications

## 2012-12-04 NOTE — Progress Notes (Signed)
The patient was examined and preop studies reviewed. There has been no change from the prior exam and the patient is ready for surgery.  Plan CABG and Tricuspid repair on Kendra Todd today

## 2012-12-04 NOTE — Anesthesia Preprocedure Evaluation (Signed)
Anesthesia Evaluation  Patient identified by MRN, date of birth, ID band Patient awake    Reviewed: Allergy & Precautions, H&P , NPO status , Patient's Chart, lab work & pertinent test results  Airway Mallampati: I TM Distance: >3 FB Neck ROM: Full    Dental   Pulmonary  + rhonchi         Cardiovascular hypertension, + CAD, + Past MI and + Peripheral Vascular Disease + dysrhythmias Atrial Fibrillation Rhythm:Regular Rate:Normal     Neuro/Psych CVA    GI/Hepatic   Endo/Other  diabetes  Renal/GU      Musculoskeletal   Abdominal   Peds  Hematology   Anesthesia Other Findings   Reproductive/Obstetrics                           Anesthesia Physical Anesthesia Plan  ASA: III  Anesthesia Plan: General   Post-op Pain Management:    Induction: Intravenous  Airway Management Planned: Oral ETT  Additional Equipment: Arterial line, PA Cath and TEE  Intra-op Plan:   Post-operative Plan: Post-operative intubation/ventilation  Informed Consent: I have reviewed the patients History and Physical, chart, labs and discussed the procedure including the risks, benefits and alternatives for the proposed anesthesia with the patient or authorized representative who has indicated his/her understanding and acceptance.     Plan Discussed with: CRNA and Surgeon  Anesthesia Plan Comments:         Anesthesia Quick Evaluation

## 2012-12-04 NOTE — Procedures (Signed)
Extubation Procedure Note  Patient Details:   Name: Kendra Todd DOB: [birth year] MRN: [id number]   Airway Documentation:     Evaluation  O2 sats: stable throughout Complications: No apparent complications Patient did tolerate procedure well. Bilateral Breath Sounds: Clear   Yes   Patient extubated to [facility] after ETT and deep oral suction.  Patient following commands well and able to vocalize post-extubation.  NIF and VC tested prior to extubation.  BBS clear/diminished.  NIF  -30cmH20 VC  1.3L [name] 12/04/2012, 11:28 PM

## 2012-12-04 NOTE — Progress Notes (Signed)
Patient placed on following setting to start wean:  SIMV/PC/PS  600/4/+5/40%/ PS10  Pt tolerating well.

## 2012-12-04 NOTE — Progress Notes (Signed)
UR Completed.  Kendra Todd [phone] 12/04/2012  

## 2012-12-04 NOTE — Progress Notes (Signed)
TCTS  Status post CABG x3 for left main and three-vessel CAD Remains sleepy despite Precedex turned off at 5 PM Hemodynamics stable PA pressures lower on milrinone urine output adequate Lab Results  Component Value Date   WBC 12.2* 12/04/2012   HGB 9.5* 12/04/2012   HCT 27.6* 12/04/2012   MCV 78.4 12/04/2012   PLT 129* 12/04/2012   Lab Results  Component Value Date   CREATININE 0.67 12/04/2012   BUN 9 12/04/2012   NA 140 12/04/2012   K 4.0 12/04/2012   CL 108 12/04/2012   CO2 22 12/03/2012

## 2012-12-04 NOTE — Anesthesia Postprocedure Evaluation (Signed)
  Anesthesia Post-op Note  [name]  Procedure(s) Performed: Procedure(s) (LRB) with comments: CORONARY ARTERY BYPASS GRAFTING (CABG) (N/A) INTRAOPERATIVE TRANSESOPHAGEAL ECHOCARDIOGRAM (N/A)  Patient Location: ICU  Anesthesia Type:General  Level of Consciousness: Patient remains intubated per anesthesia plan  Airway and Oxygen Therapy: Patient remains intubated per anesthesia plan and Patient placed on Ventilator (see vital sign flow sheet for setting)  Post-op Pain: none  Post-op Assessment: Post-op Vital signs reviewed, Patient's Cardiovascular Status Stable, Respiratory Function Stable, Patent Airway, No signs of Nausea or vomiting and Pain level controlled  Post-op Vital Signs: stable  Complications: No apparent anesthesia complications

## 2012-12-04 NOTE — Progress Notes (Signed)
Patient viewed pre-op cardiac surgery video and verbalized understanding. Will continue to monitor.

## 2012-12-05 ENCOUNTER — Encounter (HOSPITAL_COMMUNITY): Payer: Self-pay | Admitting: Cardiothoracic Surgery

## 2012-12-05 ENCOUNTER — Inpatient Hospital Stay (HOSPITAL_COMMUNITY): Payer: Medicare Other

## 2012-12-05 LAB — CBC
HCT: 28.2 % — ABNORMAL LOW (ref 36.0–46.0)
HCT: 33.2 % — ABNORMAL LOW (ref 36.0–46.0)
Hemoglobin: 9.7 g/dL — ABNORMAL LOW (ref 12.0–15.0)
Hemoglobin: 10.8 g/dL — ABNORMAL LOW (ref 12.0–15.0)
MCH: 26.7 pg (ref 26.0–34.0)
MCHC: 32.5 g/dL (ref 30.0–36.0)
MCV: 79.4 fL (ref 78.0–100.0)
RDW: 15.4 % (ref 11.5–15.5)
RDW: 15.9 % — ABNORMAL HIGH (ref 11.5–15.5)
WBC: 14 10*3/uL — ABNORMAL HIGH (ref 4.0–10.5)

## 2012-12-05 LAB — BASIC METABOLIC PANEL
BUN: 11 mg/dL (ref 6–23)
CO2: 23 mEq/L (ref 19–32)
Chloride: 109 mEq/L (ref 96–112)
Creatinine, Ser: 0.77 mg/dL (ref 0.50–1.10)
Glucose, Bld: 120 mg/dL — ABNORMAL HIGH (ref 70–99)
Potassium: 3.9 mEq/L (ref 3.5–5.1)

## 2012-12-05 LAB — GLUCOSE, CAPILLARY
Glucose-Capillary: 102 mg/dL — ABNORMAL HIGH (ref 70–99)
Glucose-Capillary: 112 mg/dL — ABNORMAL HIGH (ref 70–99)
Glucose-Capillary: 118 mg/dL — ABNORMAL HIGH (ref 70–99)
Glucose-Capillary: 121 mg/dL — ABNORMAL HIGH (ref 70–99)
Glucose-Capillary: 124 mg/dL — ABNORMAL HIGH (ref 70–99)
Glucose-Capillary: 125 mg/dL — ABNORMAL HIGH (ref 70–99)
Glucose-Capillary: 131 mg/dL — ABNORMAL HIGH (ref 70–99)
Glucose-Capillary: 145 mg/dL — ABNORMAL HIGH (ref 70–99)
Glucose-Capillary: 123 mg/dL — ABNORMAL HIGH (ref 70–99)
Glucose-Capillary: 99 mg/dL (ref 70–99)
Glucose-Capillary: 160 mg/dL — ABNORMAL HIGH (ref 70–99)

## 2012-12-05 LAB — POCT I-STAT 3, ART BLOOD GAS (G3+)
O2 Saturation: 97 %
TCO2: 24 mmol/L (ref 0–100)
pCO2 arterial: 39.6 mmHg (ref 35.0–45.0)
pO2, Arterial: 89 mmHg (ref 80.0–100.0)

## 2012-12-05 LAB — POCT I-STAT, CHEM 8
BUN: 14 mg/dL (ref 6–23)
Calcium, Ion: 1.2 mmol/L (ref 1.13–1.30)
Creatinine, Ser: 1.1 mg/dL (ref 0.50–1.10)
Glucose, Bld: 145 mg/dL — ABNORMAL HIGH (ref 70–99)
Sodium: 137 mEq/L (ref 135–145)
TCO2: 23 mmol/L (ref 0–100)

## 2012-12-05 LAB — CREATININE, SERUM: Creatinine, Ser: 1.04 mg/dL (ref 0.50–1.10)

## 2012-12-05 MED ORDER — POTASSIUM CHLORIDE 10 MEQ/50ML IV SOLN
10.0000 meq | INTRAVENOUS | Status: AC
  Administered 2012-12-05 (×2): 10 meq via INTRAVENOUS

## 2012-12-05 MED ORDER — INSULIN DETEMIR 100 UNIT/ML ~~LOC~~ SOLN
12.0000 [IU] | Freq: Every day | SUBCUTANEOUS | Status: DC
  Administered 2012-12-05 – 2012-12-09 (×5): 12 [IU] via SUBCUTANEOUS

## 2012-12-05 MED ORDER — LEVALBUTEROL HCL 1.25 MG/0.5ML IN NEBU
1.2500 mg | INHALATION_SOLUTION | Freq: Four times a day (QID) | RESPIRATORY_TRACT | Status: DC | PRN
  Administered 2012-12-07: 1.25 mg via RESPIRATORY_TRACT
  Filled 2012-12-05: qty 0.5

## 2012-12-05 MED ORDER — BUDESONIDE-FORMOTEROL FUMARATE 160-4.5 MCG/ACT IN AERO
2.0000 | INHALATION_SPRAY | Freq: Two times a day (BID) | RESPIRATORY_TRACT | Status: DC
  Administered 2012-12-05 – 2012-12-11 (×13): 2 via RESPIRATORY_TRACT
  Filled 2012-12-05: qty 6

## 2012-12-05 MED ORDER — INSULIN ASPART 100 UNIT/ML ~~LOC~~ SOLN
0.0000 [IU] | SUBCUTANEOUS | Status: DC
  Administered 2012-12-05 – 2012-12-08 (×11): 2 [IU] via SUBCUTANEOUS

## 2012-12-05 MED ORDER — METOPROLOL TARTRATE 25 MG/10 ML ORAL SUSPENSION
12.5000 mg | Freq: Two times a day (BID) | ORAL | Status: DC
  Filled 2012-12-05 (×4): qty 5

## 2012-12-05 MED ORDER — METOPROLOL TARTRATE 25 MG PO TABS
25.0000 mg | ORAL_TABLET | Freq: Two times a day (BID) | ORAL | Status: DC
  Administered 2012-12-05 (×2): 25 mg via ORAL
  Filled 2012-12-05 (×4): qty 1

## 2012-12-05 MED ORDER — AMIODARONE HCL IN DEXTROSE 360-4.14 MG/200ML-% IV SOLN
0.5000 mg/min | INTRAVENOUS | Status: DC
  Administered 2012-12-05 – 2012-12-06 (×2): 0.5 mg/min via INTRAVENOUS
  Filled 2012-12-05 (×4): qty 200

## 2012-12-05 MED ORDER — AMIODARONE LOAD VIA INFUSION
150.0000 mg | Freq: Once | INTRAVENOUS | Status: AC
  Administered 2012-12-05: 150 mg via INTRAVENOUS
  Filled 2012-12-05: qty 83.34

## 2012-12-05 MED ORDER — MILRINONE IN DEXTROSE 20 MG/100ML IV SOLN: 0.3000 ug/kg/min | INTRAVENOUS | Status: DC

## 2012-12-05 MED ORDER — TRAMADOL HCL 50 MG PO TABS
50.0000 mg | ORAL_TABLET | Freq: Four times a day (QID) | ORAL | Status: DC | PRN
  Administered 2012-12-05 – 2012-12-10 (×6): 50 mg via ORAL
  Filled 2012-12-05 (×6): qty 1

## 2012-12-05 MED ORDER — AMIODARONE HCL IN DEXTROSE 360-4.14 MG/200ML-% IV SOLN
INTRAVENOUS | Status: AC
  Filled 2012-12-05: qty 200

## 2012-12-05 MED ORDER — AMIODARONE HCL IN DEXTROSE 360-4.14 MG/200ML-% IV SOLN
1.0000 mg/min | INTRAVENOUS | Status: AC
  Administered 2012-12-05: 1 mg/min via INTRAVENOUS
  Filled 2012-12-05: qty 200

## 2012-12-05 MED ORDER — FUROSEMIDE 10 MG/ML IJ SOLN
40.0000 mg | Freq: Two times a day (BID) | INTRAMUSCULAR | Status: AC
  Administered 2012-12-05 – 2012-12-06 (×3): 40 mg via INTRAVENOUS
  Filled 2012-12-05 (×2): qty 4

## 2012-12-05 MED ORDER — LEVALBUTEROL HCL 1.25 MG/0.5ML IN NEBU
1.2500 mg | INHALATION_SOLUTION | Freq: Four times a day (QID) | RESPIRATORY_TRACT | Status: DC
  Administered 2012-12-05: 1.25 mg via RESPIRATORY_TRACT
  Filled 2012-12-05 (×4): qty 0.5

## 2012-12-05 NOTE — Progress Notes (Signed)
  Amiodarone Drug - Drug Interaction Consult Note  Recommendations: - No major DDI identified - Monitor electrolytes, especially potassium, closely  Amiodarone is metabolized by the cytochrome P450 system and therefore has the potential to cause many drug interactions. Amiodarone has an average plasma half-life of 50 days (range 20 to 100 days).   There is potential for drug interactions to occur several weeks or months after stopping treatment and the onset of drug interactions may be slow after initiating amiodarone.   [x]  Statins: Increased risk of myopathy. Simvastatin- restrict dose to 20mg  daily. Other statins: counsel patients to report any muscle pain or weakness immediately.  []  Anticoagulants: Amiodarone can increase anticoagulant effect. Consider warfarin dose reduction. Patients should be monitored closely and the dose of anticoagulant altered accordingly, remembering that amiodarone levels take several weeks to stabilize.  []  Antiepileptics: Amiodarone can increase plasma concentration of phenytoin, phenytoin dose should be reduced. Note that small changes in phenytoin dose can result in large changes in phenytoin levels. Monitor patient closely and counsel on signs of toxicity.  [x]  Beta blockers: increased risk of bradycardia, AV block and myocardial depression. Sotalol - avoid concomitant use.  []   Calcium channel blockers (diltiazem and verapamil): increased risk of bradycardia, AV block and myocardial depression.  []   Cyclosporine: Amiodarone increases levels of cyclosporine. Reduced dose of cyclosporine is recommended.  []  Digoxin dose should be halved when amiodarone is started.  [x]  Diuretics: increased risk of cardiotoxicity if hypokalemia occurs.  []  Oral hypoglycemic agents (glyburide, glipizide, glimepiride): increased risk of hypoglycemia. Patient's glucose levels should be monitored closely when initiating amiodarone therapy.   []  Drugs that prolong the QT  interval: Concurrent therapy is contraindicated due to the increased risk of torsades de pointes; . Antibiotics: e.g. fluoroquinolones, erythromycin. . Antiarrhythmics: e.g. quinidine, procainamide, disopyramide, sotalol. . Antipsychotics: e.g. phenothiazines, haloperidol.  . Lithium, tricyclic antidepressants, and methadone. Thank You,    Chelsea Aus D. Laney Potash, PharmD, BCPS Pager:  [phone] 12/05/2012, 12:43 PM

## 2012-12-05 NOTE — Progress Notes (Signed)
1 Day Post-Op Procedure(s) (LRB): CORONARY ARTERY BYPASS GRAFTING (CABG) (N/A) INTRAOPERATIVE TRANSESOPHAGEAL ECHOCARDIOGRAM (N/A) Subjective: CABGx3 for L main , non stemi Hx HTN, pulmonary HTN, COPD  Objective: Vital signs in last 24 hours: Temp:  [95.4 F (35.2 C)-98.6 F (37 C)] 97.5 F (36.4 C) (01/31 0830) Pulse Rate:  [88-100] 88  (01/31 0830) Cardiac Rhythm:  [-] Atrial paced (01/31 0800) Resp:  [12-27] 15  (01/31 0830) BP: (77-150)/(42-76) 125/46 mmHg (01/31 0800) SpO2:  [96 %-100 %] 96 % (01/31 0830) Arterial Line BP: (102-159)/(42-77) 134/51 mmHg (01/31 0830) FiO2 (%):  [40 %-50 %] 40 % (01/30 2230) Weight:  [162 lb 14.7 oz (73.9 kg)] 162 lb 14.7 oz (73.9 kg) (01/31 0500)  Hemodynamic parameters for last 24 hours: PAP: (25-52)/(12-31) 43/21 mmHg CO:  [2.2 L/min-4.7 L/min] 4.1 L/min CI:  [1.2 L/min/m2-2.7 L/min/m2] 2.4 L/min/m2  Intake/Output from previous day: 01/30 0701 - 01/31 0700 In: 6158.1 [I.V.:4073.1; Blood:355; NG/GT:30; IV Piggyback:1700] Out: 7535 [Urine:5550; Blood:1625; Chest Tube:360] Intake/Output this shift: Total I/O In: -  Out: 110 [Urine:50; Chest Tube:60]  Neuro intact Lungs clear  Lab Results:  [facility] 12/05/12 0320 12/04/12 1915  WBC 14.0* 12.2*  HGB 9.7* 9.5*  HCT 28.2* 27.6*  PLT 139* 129*   BMET:  Basename 12/05/12 0320 12/04/12 1915 12/04/12 1905 12/03/12 1534  NA 140 -- 140 --  K 3.9 -- 4.0 --  CL 109 -- 108 --  CO2 23 -- -- 22  GLUCOSE 120* -- 108* --  BUN 11 -- 9 --  CREATININE 0.77 0.67 -- --  CALCIUM 8.2* -- -- 9.4    PT/INR:  Basename 12/04/12 1330  LABPROT 16.3*  INR 1.34   ABG    Component Value Date/Time   PHART 7.360 12/05/2012 0122   HCO3 22.4 12/05/2012 0122   TCO2 24 12/05/2012 0122   ACIDBASEDEF 3.0* 12/05/2012 0122   O2SAT 97.0 12/05/2012 0122   CBG (last 3)   Basename 12/05/12 0744 12/04/12 1455 12/04/12 0535  GLUCAP 121* 131* 112*    Assessment/Plan: S/P Procedure(s) (LRB): CORONARY ARTERY  BYPASS GRAFTING (CABG) (N/A) INTRAOPERATIVE TRANSESOPHAGEAL ECHOCARDIOGRAM (N/A) See progression orders, diuresis   LOS: 8 days    VAN TRIGT III,Aydee Mcnew 12/05/2012

## 2012-12-05 NOTE — Progress Notes (Signed)
Patient ID: Kendra Todd, female   DOB: [birth year], 73 y.o.   MRN: [id number]   Hemodynamically stable  Diuresing  CBC    Component Value Date/Time   WBC 19.9* 12/05/2012 1600   RBC 4.04 12/05/2012 1600   HGB 11.2* 12/05/2012 1628   HCT 33.0* 12/05/2012 1628   PLT 173 12/05/2012 1600   MCV 82.2 12/05/2012 1600   MCH 26.7 12/05/2012 1600   MCHC 32.5 12/05/2012 1600   RDW 15.9* 12/05/2012 1600   LYMPHSABS 1.2 01/22/2011 1346   MONOABS 1.1* 01/22/2011 1346   EOSABS 0.0 01/22/2011 1346   BASOSABS 0.0 01/22/2011 1346    BMET    Component Value Date/Time   NA 137 12/05/2012 1628   K 4.8 12/05/2012 1628   CL 106 12/05/2012 1628   CO2 23 12/05/2012 0320   GLUCOSE 145* 12/05/2012 1628   BUN 14 12/05/2012 1628   CREATININE 1.10 12/05/2012 1628   CALCIUM 8.2* 12/05/2012 0320   GFRNONAA 81* 12/05/2012 0320   GFRAA >90 12/05/2012 0320    Remains weak.

## 2012-12-05 NOTE — Progress Notes (Signed)
[facility]  Subjective: No complaints. Resting ok.   Objective: Vital signs in last 24 hours: Temp:  [95.4 F (35.2 C)-98.6 F (37 C)] 97.5 F (36.4 C) (01/31 0830) Pulse Rate:  [88-100] 88  (01/31 0830) Resp:  [12-27] 15  (01/31 0830) BP: (77-150)/(42-76) 125/46 mmHg (01/31 0800) SpO2:  [96 %-100 %] 98 % (01/31 0954) Arterial Line BP: (102-159)/(42-77) 134/51 mmHg (01/31 0830) FiO2 (%):  [40 %-50 %] 40 % (01/30 2230) Weight:  [162 lb 14.7 oz (73.9 kg)] 162 lb 14.7 oz (73.9 kg) (01/31 0500) Last BM Date: 12/02/12  Intake/Output from previous day: 01/30 0701 - 01/31 0700 In: 6158.1 [I.V.:4073.1; Blood:355; NG/GT:30; IV Piggyback:1700] Out: 7535 [Urine:5550; Blood:1625; Chest Tube:360] Intake/Output this shift: Total I/O In: -  Out: 110 [Urine:50; Chest Tube:60]  Medications Current Facility-Administered Medications  Medication Dose Route Frequency Provider Last Rate Last Dose  . 0.45 % sodium chloride infusion   Intravenous Continuous Ardelle Balls, PA 20 mL/hr at 12/05/12 0837 20 mL/hr at 12/05/12 0837  . 0.9 %  sodium chloride infusion  250 mL Intravenous PRN Ardelle Balls, PA      . acetaminophen (TYLENOL) tablet 1,000 mg  1,000 mg Oral Q6H Ardelle Balls, PA   1,000 mg at 12/05/12 0510   Or  . acetaminophen (TYLENOL) solution 975 mg  975 mg Per Tube Q6H Ardelle Balls, PA   975 mg at 12/04/12 2311  . albumin human 5 % solution 12.5 g  12.5 g Intravenous Once Kerin Perna, MD      . aspirin EC tablet 325 mg  325 mg Oral Daily Ardelle Balls, PA   325 mg at 12/05/12 1000  . atorvastatin (LIPITOR) tablet 40 mg  40 mg Oral q1800 Wilburt Finlay, PA   40 mg at 12/03/12 1700  . bisacodyl (DULCOLAX) EC tablet 10 mg  10 mg Oral Daily Ardelle Balls, PA   10 mg at 12/05/12 1000   Or  . bisacodyl (DULCOLAX) suppository 10 mg  10 mg Rectal Daily Ardelle Balls, PA      . budesonide-formoterol (SYMBICORT)  160-4.5 MCG/ACT inhaler 2 puff  2 puff Inhalation BID Kerin Perna, MD   2 puff at 12/05/12 [id number]  . cefUROXime (ZINACEF) 1.5 g in dextrose 5 % 50 mL IVPB  1.5 g Intravenous Q12H Ardelle Balls, PA   1.5 g at 12/05/12 0959  . docusate sodium (COLACE) capsule 200 mg  200 mg Oral Daily Ardelle Balls, PA   200 mg at 12/05/12 0959  . furosemide (LASIX) injection 40 mg  40 mg Intravenous BID Kerin Perna, MD   40 mg at 12/05/12 7846  . insulin aspart (novoLOG) injection 0-24 Units  0-24 Units Subcutaneous Q4H Kerin Perna, MD      . insulin detemir (LEVEMIR) injection 12 Units  12 Units Subcutaneous Q1200 Kerin Perna, MD      . insulin regular (NOVOLIN R,HUMULIN R) 1 Units/mL in sodium chloride 0.9 % 100 mL infusion   Intravenous Continuous Ardelle Balls, PA 3.2 mL/hr at 12/05/12 0828 3.2 Units/hr at 12/05/12 0828  . insulin regular bolus via infusion 0-10 Units  0-10 Units Intravenous TID WC Donielle Margaretann Loveless, PA      . levalbuterol [facility]) nebulizer solution 1.25 mg  1.25 mg Nebulization Q6H Kerin Perna, MD      . metoprolol (LOPRESSOR) injection 2.5-5 mg  2.5-5 mg Intravenous Q2H  PRN Ardelle Balls, PA      . metoprolol tartrate (LOPRESSOR) tablet 25 mg  25 mg Oral BID Kerin Perna, MD   25 mg at 12/05/12 1610   Or  . metoprolol tartrate (LOPRESSOR) 25 mg/10 mL oral suspension 12.5 mg  12.5 mg Per Tube BID Kerin Perna, MD      . milrinone [facility]) infusion 200 mcg/mL (0.2 mg/ml)  0.3 mcg/kg/min Intravenous Continuous Kerin Perna, MD      . morphine 2 MG/ML injection 2-5 mg  2-5 mg Intravenous Q1H PRN Ardelle Balls, PA   2 mg at 12/05/12 0909  . nitroGLYCERIN 0.2 mg/mL in dextrose 5 % infusion  0-100 mcg/min Intravenous Continuous Donielle Margaretann Loveless, PA      . nitroPRUSSide (NIPRIDE) 50 mg in dextrose 5 % 250 mL infusion  0-0.5 mcg/kg/min Intravenous Continuous Donielle Margaretann Loveless, PA      . ondansetron (ZOFRAN) injection 4 mg  4 mg  Intravenous Q6H PRN Ardelle Balls, PA      . pantoprazole (PROTONIX) EC tablet 40 mg  40 mg Oral Daily Donielle Margaretann Loveless, PA      . phenylephrine (NEO-SYNEPHRINE) 20,000 mcg in dextrose 5 % 250 mL infusion  0-100 mcg/min Intravenous Continuous Ardelle Balls, PA   2 mcg/min at 12/05/12 0000  . potassium chloride 10 mEq in 50 mL *CENTRAL LINE* IVPB  10 mEq Intravenous Q1 Hr x 2 Kerin Perna, MD   10 mEq at 12/05/12 0945  . sodium chloride 0.9 % injection 3 mL  3 mL Intravenous Q12H Donielle Margaretann Loveless, PA      . sodium chloride 0.9 % injection 3 mL  3 mL Intravenous PRN Ardelle Balls, PA      . traMADol Janean Sark) tablet 50 mg  50 mg Oral Q6H PRN Kerin Perna, MD   50 mg at 12/05/12 1000    PE: General appearance: alert, cooperative and no distress Lungs: clear to auscultation bilaterally Heart: regular rate and rhythm Extremities: no LEE Pulses: 2+ and symmetric Skin: warm and dry Neurologic: Grossly normal  Lab Results:   Basename 12/05/12 0320 12/04/12 1915 12/04/12 1905 12/04/12 1330  WBC 14.0* 12.2* -- 13.9*  HGB 9.7* 9.5* 8.8* --  HCT 28.2* 27.6* 26.0* --  PLT 139* 129* -- 137*   BMET  Basename 12/05/12 0320 12/04/12 1915 12/04/12 1905 12/04/12 1339 12/03/12 1534 12/03/12 0505  NA 140 -- 140 140 -- --  K 3.9 -- 4.0 3.4* -- --  CL 109 -- 108 -- 97 --  CO2 23 -- -- -- 22 27  GLUCOSE 120* -- 108* 152* -- --  BUN 11 -- 9 -- 18 --  CREATININE 0.77 0.67 0.60 -- -- --  CALCIUM 8.2* -- -- -- 9.4 9.1   PT/INR  Basename 12/04/12 1330  LABPROT 16.3*  INR 1.34   Assessment/Plan  Principal Problem:  *NSTEMI (non-ST elevated myocardial infarction) Active Problems:  PAD (peripheral artery disease)  CAD (coronary artery disease): 1993 PTCA to circumflex.  2002 2 BMS stents to ostial, mid RCA now with severe multivessel CAD for possible CABG  DM2 (diabetes mellitus, type 2)  HTN (hypertension)  HLD (hyperlipidemia)  TR (tricuspid regurgitation),  severe 12/02/12 for repair with CABG  Pulmonary HTN  Plan: S/P CABG x 3 (LIMA to LAD, SVG to OM, SVG to RCA). POD#1. CP free. Pt had 1 episode of NSVT (4-5 beat) on telemetry yesterday. Currently in NSR. She  is on 25 mg of Lopressor BID. BP and HR stable. Pt is hypokalemic w/ K of 3.4. IV KCL has already been ordered by surgical team. Will continue to monitor. MD to follow with further recommendation.    LOS: 8 days    Brittainy M. Sharol Harness, PA-C 12/05/2012 10:00 AM  I have seen and examined the patient along with Brittainy M. Sharol Harness, PA-C.  I have reviewed the chart, notes and new data.  I agree with PA's note.  Key new complaints: weak and sore Key examination changes: pale; brief episode of AF with controlled rate resolved promptly after IV amiodarone bolus. Now in A-paced rhythm on IV amio drip Key new findings / data: Hgb 9.7  PLAN: Resume warfarin prior to discharge. Convert amio to PO tomorrow. Continue Amio PO for at least 30 days postop.  Thurmon Fair, MD, [facility] [phone] 12/05/2012, 3:21 PM

## 2012-12-06 ENCOUNTER — Inpatient Hospital Stay (HOSPITAL_COMMUNITY): Payer: Medicare Other

## 2012-12-06 LAB — BASIC METABOLIC PANEL
BUN: 17 mg/dL (ref 6–23)
CO2: 24 mEq/L (ref 19–32)
Calcium: 8.9 mg/dL (ref 8.4–10.5)
Chloride: 99 mEq/L (ref 96–112)
Creatinine, Ser: 1.12 mg/dL — ABNORMAL HIGH (ref 0.50–1.10)
GFR calc Af Amer: 55 mL/min — ABNORMAL LOW (ref >90)
GFR calc non Af Amer: 48 mL/min — ABNORMAL LOW (ref >90)
Glucose, Bld: 133 mg/dL — ABNORMAL HIGH (ref 70–99)
Potassium: 4.7 mEq/L (ref 3.5–5.1)
Sodium: 133 mEq/L — ABNORMAL LOW (ref 135–145)

## 2012-12-06 LAB — CBC
HCT: 33.1 % — ABNORMAL LOW (ref 36.0–46.0)
Hemoglobin: 10.8 g/dL — ABNORMAL LOW (ref 12.0–15.0)
MCH: 26.8 pg (ref 26.0–34.0)
MCHC: 32.6 g/dL (ref 30.0–36.0)
MCV: 82.1 fL (ref 78.0–100.0)
Platelets: 157 10*3/uL (ref 150–400)
RBC: 4.03 MIL/uL (ref 3.87–5.11)
RDW: 16.2 % — ABNORMAL HIGH (ref 11.5–15.5)
WBC: 19.2 10*3/uL — ABNORMAL HIGH (ref 4.0–10.5)

## 2012-12-06 LAB — GLUCOSE, CAPILLARY
Glucose-Capillary: 117 mg/dL — ABNORMAL HIGH (ref 70–99)
Glucose-Capillary: 121 mg/dL — ABNORMAL HIGH (ref 70–99)
Glucose-Capillary: 129 mg/dL — ABNORMAL HIGH (ref 70–99)
Glucose-Capillary: 146 mg/dL — ABNORMAL HIGH (ref 70–99)

## 2012-12-06 MED ORDER — AMIODARONE HCL 200 MG PO TABS
200.0000 mg | ORAL_TABLET | Freq: Two times a day (BID) | ORAL | Status: DC
  Administered 2012-12-06 – 2012-12-07 (×3): 200 mg via ORAL
  Filled 2012-12-06 (×4): qty 1

## 2012-12-06 MED ORDER — ENSURE PUDDING PO PUDG
1.0000 | Freq: Three times a day (TID) | ORAL | Status: DC
  Administered 2012-12-06 – 2012-12-10 (×8): 1 via ORAL

## 2012-12-06 MED ORDER — FUROSEMIDE 10 MG/ML IJ SOLN
40.0000 mg | Freq: Two times a day (BID) | INTRAMUSCULAR | Status: DC
  Administered 2012-12-06: 40 mg via INTRAVENOUS
  Filled 2012-12-06 (×3): qty 4

## 2012-12-06 MED ORDER — POTASSIUM CHLORIDE CRYS ER 20 MEQ PO TBCR
20.0000 meq | EXTENDED_RELEASE_TABLET | Freq: Two times a day (BID) | ORAL | Status: DC
  Administered 2012-12-06 (×2): 20 meq via ORAL
  Filled 2012-12-06 (×5): qty 1

## 2012-12-06 NOTE — Op Note (Signed)
Kendra Todd, BOUDREAU NO.:  [id number]  MEDICAL RECORD NO.:  [id number]  LOCATION:  2309                         FACILITY:  [facility]  PHYSICIAN:  Kerin Perna, M.D.  DATE OF BIRTH:  Mar 06, 1941  DATE OF PROCEDURE:  12/04/2012 DATE OF DISCHARGE:                              OPERATIVE REPORT   OPERATIONS: 1. Coronary artery bypass grafting x3 (left internal mammary artery to     LAD, saphenous vein graft to circumflex marginal, saphenous vein     graft to right coronary artery). 2. Endoscopic harvest of right leg greater saphenous vein.  SURGEON:  Kerin Perna, M.D.  ASSISTANT:  Doree Fudge, PA  ANESTHESIA:  General.  INDICATIONS:  The patient is a 72 year old African American female, heavy smoker with diabetes who presented with a non-ST elevation MI and was found by cardiac catheterization to have severe diffuse coronary artery disease with fairly well-preserved LV function.  A routine transthoracic echo showed evidence of pulmonary hypertension and moderate tricuspid regurgitation.  A followup right heart catheterization confirmed pulmonary hypertension with PA pressures of 55/30 with cardiac index of 2.2.  The mitral valve did not appear to be significantly diseased, although there was mild-to-moderate MR.  Overall LV systolic function was fairly well preserved.  The patient was prepared for multivessel bypass grafting, although her targets were poor.  She was also prepared for potential valve repair-replacement of the mitral or tricuspid valve pending results of transesophageal echo in the operating room.  I discussed the procedure of coronary bypass surgery including the use of general anesthesia and cardiopulmonary bypass, the expected recovery, and the potential risks of stroke, bleeding, MI, and death.  She demonstrated her understanding and agreed to proceed with surgery.  OPERATIVE FINDINGS: 1. Preoperative transesophageal echo showed  mild to moderate mitral     regurgitation, which was reviewed by the patient's cardiologist,     Dr. Aurelio Jew.  The intraoperative transesophageal echo showed only     mild TR. 2. Good quality conduit. 3. Poor quality targets in the circumflex and the right coronary     distribution. 4. Intraoperative anemia requiring 2 units of packed cell transfusion.  OPERATIVE PROCEDURE:  The patient was brought to the operating room and placed supine on the operating table.  General anesthesia was induced. The chest, abdomen, and legs were prepped and draped as a sterile field. A proper time-out was performed.  A sternal incision was made.  The saphenous vein was harvested endoscopically from the right leg.  The left internal mammary artery was harvested as a pedicle graft from its origin at the subclavian vessels.  It was a small vessel but had good flow.  The sternal retractor was placed and the pericardium was opened and suspended.  Pursestrings were placed in the ascending aorta and right atrium, and the patient was heparinized.  Cannulas were placed to the ascending aorta and right atrium.  After the ACT was documented as being therapeutic, the patient was placed on cardiopulmonary bypass. The coronary arteries were identified for grafting in the saphenous vein and mammary artery were prepared for the distal anastomoses. Cardioplegia cannulas were placed for both  antegrade and retrograde cold blood cardioplegia.  The patient was cooled to 32 degrees and the aortic crossclamp was applied.  An 800 mL of cold blood cardioplegia was delivered in split doses between the antegrade aortic and retrograde to coronary sinus catheters.  There was good cardioplegic arrest. Cardioplegia was delivered every 20 minutes.  The distal coronary anastomoses were then performed.  The first distal anastomosis was the right coronary branch of the RCA.  This was a 1.4-mm vessel proximal 99% stenosis.  A reverse  saphenous vein was sewn end-to- side with running 7-0 Prolene with good flow through the graft.  A second distal anastomosis was the circumflex marginal branch of the left coronary.  This had diffuse disease.  It was a difficult target.  A reverse saphenous vein was sewn end-to-side with running 7-0 Prolene. There was good flow through the graft.  Cardioplegia was redosed.  The third distal anastomosis was to the distal LAD.  More proximally, it was intramyocardial.  The left IMA pedicle was brought through an opening and the lateral pericardium was brought down onto the LAD and sewn end- to-side with running 8-0 Prolene.  There was good flow through the anastomosis after briefly releasing the pedicle bulldog.  The bulldog was reapplied and the pedicle was secured to epicardium.  Cardioplegia was redosed.  While the crossclamp was still in place, 2 proximal vein anastomoses were performed on the ascending aorta with a 4.5-mm punch running 6-0 Prolene.  The aortic wall was very thickened.  Prior to tying down the final anastomosis, air was vented from the coronaries with a dose of retrograde warm blood cardioplegia.  The crossclamp was removed.  Heart was cardioverted back to a regular rhythm.  The grafts were de- aired and opened and each had good flow.  Hemostasis was documented to the proximal and distal anastomoses.  The patient was rewarmed and reperfused and temporary pacing wires were applied.  The lungs were expanded and ventilator was resumed.  The patient was weaned off bypass on low-dose milrinone.  PA pressures coming off pump were more normal at 40/20.  Cardiac output was adequate and blood pressure was stable. Protamine was administered and there was no adverse reaction.  The cannulas were removed and the mediastinum was irrigated.  The superior mediastinal fat was closed over the aorta.  An anterior mediastinal tube and a left pleural chest tube were placed and brought out  through separate incisions.  The sternum was closed with interrupted steel wire. The pectoralis fascia was closed in running #1 Vicryl.  The subcutaneous and skin layers were closed in running Vicryl and sterile dressings were applied.  Total cardiopulmonary bypass time was 110 minutes.     Kerin Perna, M.D.     PV/MEDQ  D:  12/05/2012  T:  12/06/2012  Job:  [id number]  cc:   Landry Corporal, MD

## 2012-12-06 NOTE — Progress Notes (Signed)
2 Days Post-Op Procedure(s) (LRB): CORONARY ARTERY BYPASS GRAFTING (CABG) (N/A) INTRAOPERATIVE TRANSESOPHAGEAL ECHOCARDIOGRAM (N/A) Subjective: No complaints  Objective: Vital signs in last 24 hours: Temp:  [97 F (36.1 C)-97.7 F (36.5 C)] 97.4 F (36.3 C) (02/01 0800) Pulse Rate:  [47-101] 79  (02/01 0900) Cardiac Rhythm:  [-] Atrial paced (02/01 0730) Resp:  [12-29] 18  (02/01 0900) BP: (127-169)/(55-117) 127/56 mmHg (02/01 0800) SpO2:  [97 %-100 %] 97 % (02/01 0900) Arterial Line BP: (154-180)/(75-127) 172/97 mmHg (01/31 1015) Weight:  [74.844 kg (165 lb)] 74.844 kg (165 lb) (02/01 0600)  Hemodynamic parameters for last 24 hours: PAP: (46-60)/(22-29) 51/25 mmHg CO:  [4.1 L/min-4.4 L/min] 4.1 L/min CI:  [2.4 L/min/m2-2.5 L/min/m2] 2.4 L/min/m2  Intake/Output from previous day: 01/31 0701 - 02/01 0700 In: 1762.6 [P.O.:180; I.V.:1472.6; IV Piggyback:110] Out: 1195 [Urine:1135; Chest Tube:60] Intake/Output this shift:    General appearance: alert and cooperative Neurologic: intact Heart: regular rate and rhythm, S1, S2 normal, no murmur, click, rub or gallop Lungs: clear to auscultation bilaterally Extremities: edema mild Wound: incision ok  Lab Results:  Basename 12/06/12 0426 12/05/12 1628 12/05/12 1600  WBC 19.2* -- 19.9*  HGB 10.8* 11.2* --  HCT 33.1* 33.0* --  PLT 157 -- 173   BMET:  Basename 12/06/12 0426 12/05/12 1628 12/05/12 0320  NA 133* 137 --  K 4.7 4.8 --  CL 99 106 --  CO2 24 -- 23  GLUCOSE 133* 145* --  BUN 17 14 --  CREATININE 1.12* 1.10 --  CALCIUM 8.9 -- 8.2*    PT/INR:  Basename 12/04/12 1330  LABPROT 16.3*  INR 1.34   ABG    Component Value Date/Time   PHART 7.360 12/05/2012 0122   HCO3 22.4 12/05/2012 0122   TCO2 23 12/05/2012 1628   ACIDBASEDEF 3.0* 12/05/2012 0122   O2SAT 97.0 12/05/2012 0122   CBG (last 3)   Basename 12/06/12 0752 12/06/12 0358 12/06/12 0016  GLUCAP 132* 114* 114*   CXR:  Mild bibasilar  atelectasis  Assessment/Plan: S/P Procedure(s) (LRB): CORONARY ARTERY BYPASS GRAFTING (CABG) (N/A) INTRAOPERATIVE TRANSESOPHAGEAL ECHOCARDIOGRAM (N/A) Mobilize Diuresis Diabetes control She is A-paced at 80 and under pacer is bradycardic in 40's on IV amio. She was sinus 98 yesterday am before going into A-fib. Will switch amio to po, hold lopressor for now and continue A-pace.   LOS: 9 days    BARTLE,BRYAN K 12/06/2012

## 2012-12-06 NOTE — Progress Notes (Signed)
[facility]  DAILY PROGRESS NOTE   Subjective:  Atrial pacing with underlying sinus bradycardia. On amiodarone and lopressor.  Objective:  Temp:  [97 F (36.1 C)-97.7 F (36.5 C)] 97.4 F (36.3 C) (02/01 0800) Pulse Rate:  [47-101] 79  (02/01 0900) Resp:  [12-29] 18  (02/01 0900) BP: (127-169)/(55-117) 127/56 mmHg (02/01 0800) SpO2:  [97 %-100 %] 97 % (02/01 0900) Arterial Line BP: (172)/(97) 172/97 mmHg (01/31 1015) Weight:  [74.844 kg (165 lb)] 74.844 kg (165 lb) (02/01 0600) Weight change: 0.943 kg (2 lb 1.3 oz)  Intake/Output from previous day: 01/31 0701 - 02/01 0700 In: 1762.6 [P.O.:180; I.V.:1472.6; IV Piggyback:110] Out: 1195 [Urine:1135; Chest Tube:60]  Intake/Output from this shift:    Medications: Current Facility-Administered Medications  Medication Dose Route Frequency Provider Last Rate Last Dose  . 0.45 % sodium chloride infusion   Intravenous Continuous Ardelle Balls, PA 20 mL/hr at 12/05/12 1527 20 mL/hr at 12/05/12 1527  . 0.9 %  sodium chloride infusion  250 mL Intravenous PRN Ardelle Balls, PA      . acetaminophen (TYLENOL) tablet 1,000 mg  1,000 mg Oral Q6H Ardelle Balls, PA   1,000 mg at 12/06/12 0546   Or  . acetaminophen (TYLENOL) solution 975 mg  975 mg Per Tube Q6H Ardelle Balls, PA   975 mg at 12/04/12 2311  . albumin human 5 % solution 12.5 g  12.5 g Intravenous Once Kerin Perna, MD      . amiodarone (PACERONE) tablet 200 mg  200 mg Oral BID Alleen Borne, MD      . aspirin EC tablet 325 mg  325 mg Oral Daily Ardelle Balls, [place]   325 mg at 12/06/12 0942  . atorvastatin (LIPITOR) tablet 40 mg  40 mg Oral q1800 Wilburt Finlay, PA   40 mg at 12/05/12 1745  . bisacodyl (DULCOLAX) EC tablet 10 mg  10 mg Oral Daily Ardelle Balls, PA   10 mg at 12/06/12 1610   Or  . bisacodyl (DULCOLAX) suppository 10 mg  10 mg Rectal Daily Ardelle Balls, PA      . budesonide-formoterol  (SYMBICORT) 160-4.5 MCG/ACT inhaler 2 puff  2 puff Inhalation BID Kerin Perna, MD   2 puff at 12/06/12 0845  . docusate sodium (COLACE) capsule 200 mg  200 mg Oral Daily Ardelle Balls, PA   200 mg at 12/06/12 9604  . furosemide (LASIX) injection 40 mg  40 mg Intravenous BID Alleen Borne, MD      . insulin aspart (novoLOG) injection 0-24 Units  0-24 Units Subcutaneous Q4H Kerin Perna, MD   2 Units at 12/06/12 0845  . insulin detemir (LEVEMIR) injection 12 Units  12 Units Subcutaneous Q1200 Kerin Perna, MD   12 Units at 12/05/12 1220  . insulin regular bolus via infusion 0-10 Units  0-10 Units Intravenous TID WC Donielle Margaretann Loveless, PA      . levalbuterol [facility]) nebulizer solution 1.25 mg  1.25 mg Nebulization Q6H PRN Kerin Perna, MD      . metoprolol (LOPRESSOR) injection 2.5-5 mg  2.5-5 mg Intravenous Q2H PRN Ardelle Balls, PA      . morphine 2 MG/ML injection 2-5 mg  2-5 mg Intravenous Q1H PRN Ardelle Balls, PA   2 mg at 12/06/12 0825  . ondansetron (ZOFRAN) injection 4 mg  4 mg Intravenous Q6H PRN Ardelle Balls, PA  4 mg at 12/05/12 1030  . pantoprazole (PROTONIX) EC tablet 40 mg  40 mg Oral Daily Ardelle Balls, PA   40 mg at 12/06/12 0941  . potassium chloride SA (K-DUR,KLOR-CON) CR tablet 20 mEq  20 mEq Oral BID Alleen Borne, MD      . sodium chloride 0.9 % injection 3 mL  3 mL Intravenous Q12H Ardelle Balls, PA   3 mL at 12/06/12 0942  . sodium chloride 0.9 % injection 3 mL  3 mL Intravenous PRN Ardelle Balls, PA   3 mL at 12/05/12 1028  . traMADol (ULTRAM) tablet 50 mg  50 mg Oral Q6H PRN Kerin Perna, MD   50 mg at 12/06/12 0941    Physical Exam: General appearance: alert and no distress Neck: JVD - 2 cm above sternal notch, no adenopathy, no carotid bruit, supple, symmetrical, trachea midline and thyroid not enlarged, symmetric, no tenderness/mass/nodules Lungs: decreased breath sounds at the bases, no  crackles Heart: regular rate and rhythm, S1, S2 normal, no murmur, click, rub or gallop Abdomen: soft, non-tender; bowel sounds normal; no masses,  no organomegaly and obese Extremities: edema 1+ Pulses: 2+ and symmetric Skin: Skin color, texture, turgor normal. No rashes or lesions Neurologic: Grossly normal  Lab Results: Results for orders placed during the hospital encounter of 11/27/12 (from the past 48 hour(s))  POCT I-STAT 4, (NA,K, GLUC, HGB,HCT)     Status: Abnormal   Collection Time   12/04/12 10:13 AM      Component Value Range Comment   Sodium 134 (*) 135 - 145 mEq/L    Potassium 3.6  3.5 - 5.1 mEq/L    Glucose, Bld 110 (*) 70 - 99 mg/dL    HCT 16.1 (*) 09.6 - 46.0 %    Hemoglobin 6.1 (*) 12.0 - 15.0 g/dL   POCT I-STAT 3, BLOOD GAS (G3+)     Status: Abnormal   Collection Time   12/04/12 10:16 AM      Component Value Range Comment   pH, Arterial 7.445  7.350 - 7.450    pCO2 arterial 36.2  35.0 - 45.0 mmHg    pO2, Arterial 288.0 (*) 80.0 - 100.0 mmHg    Bicarbonate 24.8 (*) 20.0 - 24.0 mEq/L    TCO2 26  0 - 100 mmol/L    O2 Saturation 100.0      Acid-Base Excess 1.0  0.0 - 2.0 mmol/L    Sample type ARTERIAL     HEMOGLOBIN AND HEMATOCRIT, BLOOD     Status: Abnormal   Collection Time   12/04/12 11:22 AM      Component Value Range Comment   Hemoglobin 8.4 (*) 12.0 - 15.0 g/dL    HCT 04.5 (*) 40.9 - 46.0 %   PLATELET COUNT     Status: Abnormal   Collection Time   12/04/12 11:22 AM      Component Value Range Comment   Platelets 137 (*) 150 - 400 K/uL   POCT I-STAT 4, (NA,K, GLUC, HGB,HCT)     Status: Abnormal   Collection Time   12/04/12 11:24 AM      Component Value Range Comment   Sodium 135  135 - 145 mEq/L    Potassium 5.0  3.5 - 5.1 mEq/L    Glucose, Bld 128 (*) 70 - 99 mg/dL    HCT 81.1 (*) 91.4 - 46.0 %    Hemoglobin 8.5 (*) 12.0 - 15.0 g/dL   POCT I-STAT 4, (  NA,K, GLUC, HGB,HCT)     Status: Abnormal   Collection Time   12/04/12 12:15 PM      Component Value  Range Comment   Sodium 135  135 - 145 mEq/L    Potassium 4.2  3.5 - 5.1 mEq/L    Glucose, Bld 143 (*) 70 - 99 mg/dL    HCT 16.1 (*) 09.6 - 46.0 %    Hemoglobin 8.2 (*) 12.0 - 15.0 g/dL   POCT I-STAT 3, BLOOD GAS (G3+)     Status: Abnormal   Collection Time   12/04/12 12:19 PM      Component Value Range Comment   pH, Arterial 7.461 (*) 7.350 - 7.450    pCO2 arterial 36.0  35.0 - 45.0 mmHg    pO2, Arterial 460.0 (*) 80.0 - 100.0 mmHg    Bicarbonate 25.7 (*) 20.0 - 24.0 mEq/L    TCO2 27  0 - 100 mmol/L    O2 Saturation 100.0      Acid-Base Excess 2.0  0.0 - 2.0 mmol/L    Sample type ARTERIAL     CBC     Status: Abnormal   Collection Time   12/04/12  1:30 PM      Component Value Range Comment   WBC 13.9 (*) 4.0 - 10.5 K/uL    RBC 4.04  3.87 - 5.11 MIL/uL    Hemoglobin 11.0 (*) 12.0 - 15.0 g/dL    HCT 04.5 (*) 40.9 - 46.0 %    MCV 78.2  78.0 - 100.0 fL    MCH 27.2  26.0 - 34.0 pg    MCHC 34.8  30.0 - 36.0 g/dL    RDW 81.1  91.4 - 78.2 %    Platelets 137 (*) 150 - 400 K/uL   PROTIME-INR     Status: Abnormal   Collection Time   12/04/12  1:30 PM      Component Value Range Comment   Prothrombin Time 16.3 (*) 11.6 - 15.2 seconds    INR 1.34  0.00 - 1.49   APTT     Status: Normal   Collection Time   12/04/12  1:30 PM      Component Value Range Comment   aPTT 35  24 - 37 seconds   POCT I-STAT 3, BLOOD GAS (G3+)     Status: Abnormal   Collection Time   12/04/12  1:34 PM      Component Value Range Comment   pH, Arterial 7.506 (*) 7.350 - 7.450    pCO2 arterial 29.0 (*) 35.0 - 45.0 mmHg    pO2, Arterial 73.0 (*) 80.0 - 100.0 mmHg    Bicarbonate 23.3  20.0 - 24.0 mEq/L    TCO2 24  0 - 100 mmol/L    O2 Saturation 97.0      Patient temperature 35.1 C      Collection site ARTERIAL LINE      Drawn by Operator      Sample type ARTERIAL     POCT I-STAT 4, (NA,K, GLUC, HGB,HCT)     Status: Abnormal   Collection Time   12/04/12  1:39 PM      Component Value Range Comment   Sodium 140   135 - 145 mEq/L    Potassium 3.4 (*) 3.5 - 5.1 mEq/L    Glucose, Bld 152 (*) 70 - 99 mg/dL    HCT 95.6 (*) 21.3 - 46.0 %    Hemoglobin 10.5 (*) 12.0 - 15.0 g/dL  GLUCOSE, CAPILLARY     Status: Abnormal   Collection Time   12/04/12  2:55 PM      Component Value Range Comment   Glucose-Capillary 131 (*) 70 - 99 mg/dL   GLUCOSE, CAPILLARY     Status: Abnormal   Collection Time   12/04/12  3:59 PM      Component Value Range Comment   Glucose-Capillary 109 (*) 70 - 99 mg/dL    Comment 1 Documented in Chart     GLUCOSE, CAPILLARY     Status: Normal   Collection Time   12/04/12  5:05 PM      Component Value Range Comment   Glucose-Capillary 97  70 - 99 mg/dL   CARBOXYHEMOGLOBIN     Status: Abnormal   Collection Time   12/04/12  5:20 PM      Component Value Range Comment   Total hemoglobin 10.0 (*) 12.0 - 16.0 g/dL    O2 Saturation 16.1      Carboxyhemoglobin 1.1  0.5 - 1.5 %    Methemoglobin 1.3  0.0 - 1.5 %   GLUCOSE, CAPILLARY     Status: Abnormal   Collection Time   12/04/12  6:13 PM      Component Value Range Comment   Glucose-Capillary 102 (*) 70 - 99 mg/dL   GLUCOSE, CAPILLARY     Status: Abnormal   Collection Time   12/04/12  6:55 PM      Component Value Range Comment   Glucose-Capillary 106 (*) 70 - 99 mg/dL   POCT I-STAT, CHEM 8     Status: Abnormal   Collection Time   12/04/12  7:05 PM      Component Value Range Comment   Sodium 140  135 - 145 mEq/L    Potassium 4.0  3.5 - 5.1 mEq/L    Chloride 108  96 - 112 mEq/L    BUN 9  6 - 23 mg/dL    Creatinine, Ser 0.96  0.50 - 1.10 mg/dL    Glucose, Bld 045 (*) 70 - 99 mg/dL    Calcium, Ion 4.09 (*) 1.13 - 1.30 mmol/L    TCO2 22  0 - 100 mmol/L    Hemoglobin 8.8 (*) 12.0 - 15.0 g/dL    HCT 81.1 (*) 91.4 - 46.0 %   CBC     Status: Abnormal   Collection Time   12/04/12  7:15 PM      Component Value Range Comment   WBC 12.2 (*) 4.0 - 10.5 K/uL    RBC 3.52 (*) 3.87 - 5.11 MIL/uL    Hemoglobin 9.5 (*) 12.0 - 15.0 g/dL    HCT  78.2 (*) 95.6 - 46.0 %    MCV 78.4  78.0 - 100.0 fL    MCH 27.0  26.0 - 34.0 pg    MCHC 34.4  30.0 - 36.0 g/dL    RDW 21.3  08.6 - 57.8 %    Platelets 129 (*) 150 - 400 K/uL   CREATININE, SERUM     Status: Abnormal   Collection Time   12/04/12  7:15 PM      Component Value Range Comment   Creatinine, Ser 0.67  0.50 - 1.10 mg/dL    GFR calc non Af Amer 85 (*) >90 mL/min    GFR calc Af Amer >90  >90 mL/min   MAGNESIUM     Status: Abnormal   Collection Time   12/04/12  7:15 PM  Component Value Range Comment   Magnesium 3.2 (*) 1.5 - 2.5 mg/dL   GLUCOSE, CAPILLARY     Status: Abnormal   Collection Time   12/04/12  8:10 PM      Component Value Range Comment   Glucose-Capillary 105 (*) 70 - 99 mg/dL   GLUCOSE, CAPILLARY     Status: Abnormal   Collection Time   12/04/12  9:14 PM      Component Value Range Comment   Glucose-Capillary 145 (*) 70 - 99 mg/dL   GLUCOSE, CAPILLARY     Status: Abnormal   Collection Time   12/04/12 10:15 PM      Component Value Range Comment   Glucose-Capillary 125 (*) 70 - 99 mg/dL   GLUCOSE, CAPILLARY     Status: Abnormal   Collection Time   12/04/12 11:04 PM      Component Value Range Comment   Glucose-Capillary 126 (*) 70 - 99 mg/dL   POCT I-STAT 3, BLOOD GAS (G3+)     Status: Normal   Collection Time   12/04/12 11:05 PM      Component Value Range Comment   pH, Arterial 7.363  7.350 - 7.450    pCO2 arterial 40.0  35.0 - 45.0 mmHg    pO2, Arterial 98.0  80.0 - 100.0 mmHg    Bicarbonate 22.7  20.0 - 24.0 mEq/L    TCO2 24  0 - 100 mmol/L    O2 Saturation 97.0      Acid-base deficit 2.0  0.0 - 2.0 mmol/L    Patient temperature 37.0 C      Collection site ARTERIAL LINE      Drawn by Nurse      Sample type ARTERIAL     GLUCOSE, CAPILLARY     Status: Abnormal   Collection Time   12/05/12 12:08 AM      Component Value Range Comment   Glucose-Capillary 121 (*) 70 - 99 mg/dL   GLUCOSE, CAPILLARY     Status: Abnormal   Collection Time   12/05/12   1:12 AM      Component Value Range Comment   Glucose-Capillary 112 (*) 70 - 99 mg/dL   POCT I-STAT 3, BLOOD GAS (G3+)     Status: Abnormal   Collection Time   12/05/12  1:22 AM      Component Value Range Comment   pH, Arterial 7.360  7.350 - 7.450    pCO2 arterial 39.6  35.0 - 45.0 mmHg    pO2, Arterial 89.0  80.0 - 100.0 mmHg    Bicarbonate 22.4  20.0 - 24.0 mEq/L    TCO2 24  0 - 100 mmol/L    O2 Saturation 97.0      Acid-base deficit 3.0 (*) 0.0 - 2.0 mmol/L    Patient temperature 36.8 C      Collection site ARTERIAL LINE      Drawn by Nurse      Sample type ARTERIAL     GLUCOSE, CAPILLARY     Status: Abnormal   Collection Time   12/05/12  2:18 AM      Component Value Range Comment   Glucose-Capillary 118 (*) 70 - 99 mg/dL   CBC     Status: Abnormal   Collection Time   12/05/12  3:20 AM      Component Value Range Comment   WBC 14.0 (*) 4.0 - 10.5 K/uL    RBC 3.55 (*) 3.87 - 5.11 MIL/uL  Hemoglobin 9.7 (*) 12.0 - 15.0 g/dL    HCT 16.1 (*) 09.6 - 46.0 %    MCV 79.4  78.0 - 100.0 fL    MCH 27.3  26.0 - 34.0 pg    MCHC 34.4  30.0 - 36.0 g/dL    RDW 04.5  40.9 - 81.1 %    Platelets 139 (*) 150 - 400 K/uL   BASIC METABOLIC PANEL     Status: Abnormal   Collection Time   12/05/12  3:20 AM      Component Value Range Comment   Sodium 140  135 - 145 mEq/L    Potassium 3.9  3.5 - 5.1 mEq/L    Chloride 109  96 - 112 mEq/L    CO2 23  19 - 32 mEq/L    Glucose, Bld 120 (*) 70 - 99 mg/dL    BUN 11  6 - 23 mg/dL    Creatinine, Ser 9.14  0.50 - 1.10 mg/dL    Calcium 8.2 (*) 8.4 - 10.5 mg/dL    GFR calc non Af Amer 81 (*) >90 mL/min    GFR calc Af Amer >90  >90 mL/min   MAGNESIUM     Status: Abnormal   Collection Time   12/05/12  3:20 AM      Component Value Range Comment   Magnesium 2.8 (*) 1.5 - 2.5 mg/dL   GLUCOSE, CAPILLARY     Status: Abnormal   Collection Time   12/05/12  3:28 AM      Component Value Range Comment   Glucose-Capillary 115 (*) 70 - 99 mg/dL   GLUCOSE,  CAPILLARY     Status: Abnormal   Collection Time   12/05/12  5:04 AM      Component Value Range Comment   Glucose-Capillary 111 (*) 70 - 99 mg/dL   GLUCOSE, CAPILLARY     Status: Abnormal   Collection Time   12/05/12  6:20 AM      Component Value Range Comment   Glucose-Capillary 115 (*) 70 - 99 mg/dL   GLUCOSE, CAPILLARY     Status: Abnormal   Collection Time   12/05/12  7:27 AM      Component Value Range Comment   Glucose-Capillary 131 (*) 70 - 99 mg/dL   GLUCOSE, CAPILLARY     Status: Abnormal   Collection Time   12/05/12  7:44 AM      Component Value Range Comment   Glucose-Capillary 121 (*) 70 - 99 mg/dL    Comment 1 Notify RN     GLUCOSE, CAPILLARY     Status: Abnormal   Collection Time   12/05/12  8:25 AM      Component Value Range Comment   Glucose-Capillary 124 (*) 70 - 99 mg/dL    Comment 1 Documented in Chart     GLUCOSE, CAPILLARY     Status: Abnormal   Collection Time   12/05/12  9:32 AM      Component Value Range Comment   Glucose-Capillary 131 (*) 70 - 99 mg/dL    Comment 1 Documented in Chart     GLUCOSE, CAPILLARY     Status: Abnormal   Collection Time   12/05/12 10:35 AM      Component Value Range Comment   Glucose-Capillary 123 (*) 70 - 99 mg/dL   GLUCOSE, CAPILLARY     Status: Normal   Collection Time   12/05/12 12:01 PM      Component Value Range Comment  Glucose-Capillary 99  70 - 99 mg/dL   GLUCOSE, CAPILLARY     Status: Abnormal   Collection Time   12/05/12  1:09 PM      Component Value Range Comment   Glucose-Capillary 133 (*) 70 - 99 mg/dL   GLUCOSE, CAPILLARY     Status: Abnormal   Collection Time   12/05/12  1:52 PM      Component Value Range Comment   Glucose-Capillary 128 (*) 70 - 99 mg/dL    Comment 1 Documented in Chart     MAGNESIUM     Status: Abnormal   Collection Time   12/05/12  4:00 PM      Component Value Range Comment   Magnesium 2.8 (*) 1.5 - 2.5 mg/dL   CBC     Status: Abnormal   Collection Time   12/05/12  4:00 PM       Component Value Range Comment   WBC 19.9 (*) 4.0 - 10.5 K/uL    RBC 4.04  3.87 - 5.11 MIL/uL    Hemoglobin 10.8 (*) 12.0 - 15.0 g/dL    HCT 95.2 (*) 84.1 - 46.0 %    MCV 82.2  78.0 - 100.0 fL    MCH 26.7  26.0 - 34.0 pg    MCHC 32.5  30.0 - 36.0 g/dL    RDW 32.4 (*) 40.1 - 15.5 %    Platelets 173  150 - 400 K/uL   CREATININE, SERUM     Status: Abnormal   Collection Time   12/05/12  4:00 PM      Component Value Range Comment   Creatinine, Ser 1.04  0.50 - 1.10 mg/dL    GFR calc non Af Amer 52 (*) >90 mL/min    GFR calc Af Amer 60 (*) >90 mL/min   POCT I-STAT, CHEM 8     Status: Abnormal   Collection Time   12/05/12  4:28 PM      Component Value Range Comment   Sodium 137  135 - 145 mEq/L    Potassium 4.8  3.5 - 5.1 mEq/L    Chloride 106  96 - 112 mEq/L    BUN 14  6 - 23 mg/dL    Creatinine, Ser 0.27  0.50 - 1.10 mg/dL    Glucose, Bld 253 (*) 70 - 99 mg/dL    Calcium, Ion 6.64  4.03 - 1.30 mmol/L    TCO2 23  0 - 100 mmol/L    Hemoglobin 11.2 (*) 12.0 - 15.0 g/dL    HCT 47.4 (*) 25.9 - 46.0 %   GLUCOSE, CAPILLARY     Status: Abnormal   Collection Time   12/05/12  7:46 PM      Component Value Range Comment   Glucose-Capillary 160 (*) 70 - 99 mg/dL   GLUCOSE, CAPILLARY     Status: Abnormal   Collection Time   12/06/12 12:16 AM      Component Value Range Comment   Glucose-Capillary 114 (*) 70 - 99 mg/dL   GLUCOSE, CAPILLARY     Status: Abnormal   Collection Time   12/06/12  3:58 AM      Component Value Range Comment   Glucose-Capillary 114 (*) 70 - 99 mg/dL   BASIC METABOLIC PANEL     Status: Abnormal   Collection Time   12/06/12  4:26 AM      Component Value Range Comment   Sodium 133 (*) 135 - 145 mEq/L    Potassium 4.7  3.5 - 5.1 mEq/L    Chloride 99  96 - 112 mEq/L    CO2 24  19 - 32 mEq/L    Glucose, Bld 133 (*) 70 - 99 mg/dL    BUN 17  6 - 23 mg/dL    Creatinine, Ser 1.61 (*) 0.50 - 1.10 mg/dL    Calcium 8.9  8.4 - 09.6 mg/dL    GFR calc non Af Amer 48 (*) >90 mL/min     GFR calc Af Amer 55 (*) >90 mL/min   CBC     Status: Abnormal   Collection Time   12/06/12  4:26 AM      Component Value Range Comment   WBC 19.2 (*) 4.0 - 10.5 K/uL    RBC 4.03  3.87 - 5.11 MIL/uL    Hemoglobin 10.8 (*) 12.0 - 15.0 g/dL    HCT 04.5 (*) 40.9 - 46.0 %    MCV 82.1  78.0 - 100.0 fL    MCH 26.8  26.0 - 34.0 pg    MCHC 32.6  30.0 - 36.0 g/dL    RDW 81.1 (*) 91.4 - 15.5 %    Platelets 157  150 - 400 K/uL   GLUCOSE, CAPILLARY     Status: Abnormal   Collection Time   12/06/12  7:52 AM      Component Value Range Comment   Glucose-Capillary 132 (*) 70 - 99 mg/dL     Imaging: Dg Chest Portable 1 View In Am  12/06/2012  *RADIOLOGY REPORT*  Clinical Data: Postop cardiac surgery  PORTABLE CHEST - 1 VIEW  Comparison: Prior chest x-ray 12/05/2012  Findings: Interval removal of Swan-Ganz catheter, mediastinal drain and left-sided chest tube.  The right IJ vascular sheath remains in unchanged position with the tip projecting over the superior vena cava.  Epicardial pacing leads remain in place.  No evidence of pneumothorax.  Slight lower inspiratory volumes with persistent bibasilar atelectasis.  Trace bilateral pleural effusions.  Unchanged cardiomegaly with evidence of left atrial enlargement.  Status post median sternotomy for multivessel CABG.  IMPRESSION:  1.  Interval removal of mediastinal drain, left thoracostomy tube and Swan-Ganz catheter. 2.  Persistent trace bilateral pleural effusions and bibasilar atelectasis.   Original Report Authenticated By: Malachy Moan, M.D.    Dg Chest Portable 1 View In Am  12/05/2012  *RADIOLOGY REPORT*  Clinical Data: Coronary artery disease.  Status post CABG.  PORTABLE CHEST - 1 VIEW  Comparison: 12/04/2012  Findings: Endotracheal tube and NG tube have been removed. Left chest and mediastinal tubes and Swan-Ganz catheter remain.  No pneumothorax.  Minimal new atelectasis at the lung bases.  Heart size is normal.  IMPRESSION: Minimal new  atelectasis at the lung bases.   Original Report Authenticated By: Francene Boyers, M.D.    Dg Chest Portable 1 View  12/04/2012  *RADIOLOGY REPORT*  Clinical Data: Postoperative evaluation  PORTABLE CHEST - 1 VIEW  Comparison: Preoperative chest x-ray 11/27/2012  Findings: Interval surgical changes of open heart surgery for multivessel CABG.  The patient is intubated, the tip of endotracheal tube is 2.8 cm above the carina.  A right IJ vascular sheath conveys a Swan-Ganz catheter into the heart.  The tip of the Swan overlies the proximal right main pulmonary artery.  A nasogastric tube is present, the tip of the tube lies below the diaphragm, likely within the stomach.  There are two subxiphoid approach drains, one within the mediastinum and one in the left thorax.  Epicardial  pacing leads are noted.  Negative for pulmonary edema, pleural effusion or pneumothorax.  There is mild bibasilar atelectasis.  IMPRESSION:  1.  Surgical changes of median sternotomy for multivessel CABG with mild postoperative bibasilar atelectasis. 2.  Support apparatus in satisfactory position as above.  Of note, the tip of the endotracheal tube is 2.8 cm above the carina and the tip of the Swan-Ganz catheter overlies the proximal right main pulmonary artery.   Original Report Authenticated By: Malachy Moan, M.D.     Assessment:  1. Principal Problem: 2.  *NSTEMI (non-ST elevated myocardial infarction) 3. Active Problems: 4.  PAD (peripheral artery disease) 5.  CAD (coronary artery disease): 1993 PTCA to circumflex.  2002 2 BMS stents to ostial, S/P CABG x 3 12/04/12 6.  DM2 (diabetes mellitus, type 2) 7.  HTN (hypertension) 8.  HLD (hyperlipidemia) 9.  TR (tricuspid regurgitation), severe 12/02/12 for repair with CABG 10.  Pulmonary HTN 11.   Plan:  1. Agree with switching amiodarone to oral and holding lopressor to allow a higher sinoatrial rate. Consider weaning pacer rate to 60 to decrease dependence once she has  been diuresed.  Time Spent Directly with Patient:  15 minutes  Length of Stay:  LOS: 9 days   Chrystie Nose, MD, [facility] Attending Cardiologist [facility]  Dshaun Reppucci C 12/06/2012, 10:00 AM

## 2012-12-06 NOTE — Progress Notes (Signed)
Patient ID: Kendra Todd, female   DOB: 1940-11-06, 72 y.o.   MRN: [id number]  SICU Evening Rounds:  Hemodynamically stable.  A-paced at 90 Diuresed today.

## 2012-12-07 LAB — BASIC METABOLIC PANEL
CO2: 25 mEq/L (ref 19–32)
Chloride: 100 mEq/L (ref 96–112)
Creatinine, Ser: 1.17 mg/dL — ABNORMAL HIGH (ref 0.50–1.10)
GFR calc Af Amer: 52 mL/min — ABNORMAL LOW (ref >90)
Potassium: 5.1 mEq/L (ref 3.5–5.1)

## 2012-12-07 LAB — TYPE AND SCREEN
ABO/RH(D): A POS
Antibody Screen: NEGATIVE
Unit division: 0
Unit division: 0
Unit division: 0
Unit division: 0
Unit division: 0
Unit division: 0

## 2012-12-07 LAB — GLUCOSE, CAPILLARY
Glucose-Capillary: 113 mg/dL — ABNORMAL HIGH (ref 70–99)
Glucose-Capillary: 112 mg/dL — ABNORMAL HIGH (ref 70–99)

## 2012-12-07 LAB — 5 HIAA, QUANTITATIVE, URINE, 24 HOUR
5-HIAA, 24 Hr Urine: 3.2 mg/24 h (ref <=6.0)
Volume, Urine-5HIAA: 1450 mL/24 h

## 2012-12-07 LAB — CBC
HCT: 32.5 % — ABNORMAL LOW (ref 36.0–46.0)
Hemoglobin: 10.5 g/dL — ABNORMAL LOW (ref 12.0–15.0)
MCHC: 32.3 g/dL (ref 30.0–36.0)
RBC: 3.94 MIL/uL (ref 3.87–5.11)
WBC: 14.9 10*3/uL — ABNORMAL HIGH (ref 4.0–10.5)

## 2012-12-07 MED ORDER — WARFARIN - PHYSICIAN DOSING INPATIENT
Freq: Every day | Status: DC
  Administered 2012-12-07: 18:00:00

## 2012-12-07 MED ORDER — AMIODARONE HCL IN DEXTROSE 360-4.14 MG/200ML-% IV SOLN
INTRAVENOUS | Status: AC
  Filled 2012-12-07: qty 200

## 2012-12-07 MED ORDER — WARFARIN SODIUM 5 MG PO TABS
5.0000 mg | ORAL_TABLET | Freq: Once | ORAL | Status: AC
  Administered 2012-12-07: 5 mg via ORAL
  Filled 2012-12-07: qty 1

## 2012-12-07 MED ORDER — AMIODARONE IV BOLUS ONLY 150 MG/100ML
150.0000 mg | Freq: Once | INTRAVENOUS | Status: AC
  Administered 2012-12-07: 150 mg via INTRAVENOUS
  Filled 2012-12-07: qty 100

## 2012-12-07 MED ORDER — AMIODARONE HCL 200 MG PO TABS
400.0000 mg | ORAL_TABLET | Freq: Two times a day (BID) | ORAL | Status: DC
  Administered 2012-12-07 – 2012-12-11 (×8): 400 mg via ORAL
  Filled 2012-12-07 (×10): qty 2

## 2012-12-07 MED ORDER — FUROSEMIDE 10 MG/ML IJ SOLN
80.0000 mg | Freq: Two times a day (BID) | INTRAMUSCULAR | Status: DC
  Administered 2012-12-07 – 2012-12-08 (×3): 80 mg via INTRAVENOUS
  Filled 2012-12-07 (×4): qty 8

## 2012-12-07 NOTE — Progress Notes (Signed)
3 Days Post-Op Procedure(s) (LRB): CORONARY ARTERY BYPASS GRAFTING (CABG) (N/A) INTRAOPERATIVE TRANSESOPHAGEAL ECHOCARDIOGRAM (N/A) Subjective:  No complaints  Objective: Vital signs in last 24 hours: Temp:  [97.1 F (36.2 C)-98.1 F (36.7 C)] 97.9 F (36.6 C) (02/02 0759) Pulse Rate:  [30-107] 68  (02/02 0800) Cardiac Rhythm:  [-] A-V Sequential paced (02/02 0800) Resp:  [14-34] 21  (02/02 0800) BP: (85-152)/(49-107) 118/66 mmHg (02/02 0800) SpO2:  [91 %-100 %] 99 % (02/02 0800) Weight:  [76.5 kg (168 lb 10.4 oz)] 76.5 kg (168 lb 10.4 oz) (02/02 0500)  Hemodynamic parameters for last 24 hours:    Intake/Output from previous day: 02/01 0701 - 02/02 0700 In: 1100.8 [P.O.:450; I.V.:546.8; IV Piggyback:104] Out: 1240 [Urine:1240] Intake/Output this shift: Total I/O In: 20 [I.V.:20] Out: 20 [Urine:20]  General appearance: alert and cooperative Heart: regular rate and rhythm, S1, S2 normal, no murmur, click, rub or gallop Lungs: diminished breath sounds bibasilar Extremities: edema mild Wound: incisions ok  Lab Results:  Basename 12/07/12 0350 12/06/12 0426  WBC 14.9* 19.2*  HGB 10.5* 10.8*  HCT 32.5* 33.1*  PLT 178 157   BMET:  Basename 12/07/12 0350 12/06/12 0426  NA 133* 133*  K 5.1 4.7  CL 100 99  CO2 25 24  GLUCOSE 125* 133*  BUN 25* 17  CREATININE 1.17* 1.12*  CALCIUM 8.9 8.9    PT/INR:  Basename 12/04/12 1330  LABPROT 16.3*  INR 1.34   ABG    Component Value Date/Time   PHART 7.360 12/05/2012 0122   HCO3 22.4 12/05/2012 0122   TCO2 23 12/05/2012 1628   ACIDBASEDEF 3.0* 12/05/2012 0122   O2SAT 97.0 12/05/2012 0122   CBG (last 3)   Basename 12/07/12 0750 12/07/12 0315 12/06/12 2359  GLUCAP 112* 113* 121*    Assessment/Plan: S/P Procedure(s) (LRB): CORONARY ARTERY BYPASS GRAFTING (CABG) (N/A) INTRAOPERATIVE TRANSESOPHAGEAL ECHOCARDIOGRAM (N/A) Hemodynamically stable. She is back in sinus rhythm now with rate 70's. Will turn pacer off and  keep connected. Continue amio for now and observe rhythm. She still has significant volume excess and did not diurese with 40mg  lasix so will increase to 80mg . Her creat is normal. Continue mobilization and IS Diabetes is under control.   LOS: 10 days    Kendra Todd K 12/07/2012

## 2012-12-07 NOTE — Progress Notes (Signed)
[facility]  DAILY PROGRESS NOTE   Subjective:  A-fib again overnight, now underlying is a-fib with slow ventricular response. Intermittent loss of capture overnight and now set at a V rate of 90 with 17mA output (by nursing overnight).   Objective:  Temp:  [97.1 F (36.2 C)-98.1 F (36.7 C)] 97.9 F (36.6 C) (02/02 0759) Pulse Rate:  [30-107] 91  (02/02 0700) Resp:  [14-34] 16  (02/02 0700) BP: (85-152)/(49-107) 85/64 mmHg (02/02 0700) SpO2:  [91 %-100 %] 100 % (02/02 0753) Weight:  [76.5 kg (168 lb 10.4 oz)] 76.5 kg (168 lb 10.4 oz) (02/02 0500) Weight change: 1.656 kg (3 lb 10.4 oz)  Intake/Output from previous day: 02/01 0701 - 02/02 0700 In: 1100.8 [P.O.:450; I.V.:546.8; IV Piggyback:104] Out: 1240 [Urine:1240]  Intake/Output from this shift:    Medications: Current Facility-Administered Medications  Medication Dose Route Frequency Provider Last Rate Last Dose  . 0.45 % sodium chloride infusion   Intravenous Continuous Ardelle Balls, PA 20 mL/hr at 12/05/12 1527 20 mL/hr at 12/05/12 1527  . 0.9 %  sodium chloride infusion  250 mL Intravenous PRN Ardelle Balls, PA      . acetaminophen (TYLENOL) tablet 1,000 mg  1,000 mg Oral Q6H Ardelle Balls, PA   1,000 mg at 12/07/12 8657   Or  . acetaminophen (TYLENOL) solution 975 mg  975 mg Per Tube Q6H Ardelle Balls, PA   975 mg at 12/04/12 2311  . albumin human 5 % solution 12.5 g  12.5 g Intravenous Once Kerin Perna, MD      . amiodarone (PACERONE) tablet 200 mg  200 mg Oral BID Alleen Borne, MD   200 mg at 12/06/12 2201  . aspirin EC tablet 325 mg  325 mg Oral Daily Ardelle Balls, [place]   325 mg at 12/06/12 8469  . atorvastatin (LIPITOR) tablet 40 mg  40 mg Oral q1800 Wilburt Finlay, PA   40 mg at 12/06/12 1802  . bisacodyl (DULCOLAX) EC tablet 10 mg  10 mg Oral Daily Ardelle Balls, PA   10 mg at 12/06/12 6295   Or  . bisacodyl (DULCOLAX) suppository 10 mg   10 mg Rectal Daily Ardelle Balls, PA      . budesonide-formoterol (SYMBICORT) 160-4.5 MCG/ACT inhaler 2 puff  2 puff Inhalation BID Kerin Perna, MD   2 puff at 12/07/12 0753  . docusate sodium (COLACE) capsule 200 mg  200 mg Oral Daily Ardelle Balls, PA   200 mg at 12/06/12 2841  . feeding supplement (ENSURE) pudding 1 Container  1 Container Oral TID BM Alleen Borne, MD   1 Container at 12/06/12 2033  . furosemide (LASIX) injection 40 mg  40 mg Intravenous BID Alleen Borne, MD   40 mg at 12/06/12 1802  . insulin aspart (novoLOG) injection 0-24 Units  0-24 Units Subcutaneous Q4H Kerin Perna, MD   2 Units at 12/07/12 0005  . insulin detemir (LEVEMIR) injection 12 Units  12 Units Subcutaneous Q1200 Kerin Perna, MD   12 Units at 12/06/12 1117  . insulin regular bolus via infusion 0-10 Units  0-10 Units Intravenous TID WC Donielle Margaretann Loveless, PA      . levalbuterol [facility]) nebulizer solution 1.25 mg  1.25 mg Nebulization Q6H PRN Kerin Perna, MD   1.25 mg at 12/07/12 0357  . metoprolol (LOPRESSOR) injection 2.5-5 mg  2.5-5 mg Intravenous Q2H PRN Donielle  Margaretann Loveless, PA   2.5 mg at 12/07/12 0422  . morphine 2 MG/ML injection 2-5 mg  2-5 mg Intravenous Q1H PRN Ardelle Balls, PA   2 mg at 12/06/12 1629  . ondansetron (ZOFRAN) injection 4 mg  4 mg Intravenous Q6H PRN Ardelle Balls, PA   4 mg at 12/05/12 1030  . pantoprazole (PROTONIX) EC tablet 40 mg  40 mg Oral Daily Ardelle Balls, PA   40 mg at 12/06/12 0941  . potassium chloride SA (K-DUR,KLOR-CON) CR tablet 20 mEq  20 mEq Oral BID Alleen Borne, MD   20 mEq at 12/06/12 2201  . sodium chloride 0.9 % injection 3 mL  3 mL Intravenous Q12H Ardelle Balls, PA   3 mL at 12/06/12 2202  . sodium chloride 0.9 % injection 3 mL  3 mL Intravenous PRN Ardelle Balls, PA   3 mL at 12/05/12 1028  . traMADol (ULTRAM) tablet 50 mg  50 mg Oral Q6H PRN Kerin Perna, MD   50 mg at 12/06/12 0941     Physical Exam: General appearance: alert and no distress Neck: no adenopathy, no carotid bruit, no JVD, supple, symmetrical, trachea midline and thyroid not enlarged, symmetric, no tenderness/mass/nodules Lungs: rhonchi bilaterally Heart: irregularly irregular rhythm Abdomen: soft, non-tender; bowel sounds normal; no masses,  no organomegaly Extremities: edema 1+ Pulses: 2+ and symmetric  Lab Results: Results for orders placed during the hospital encounter of 11/27/12 (from the past 48 hour(s))  GLUCOSE, CAPILLARY     Status: Abnormal   Collection Time   12/05/12  8:25 AM      Component Value Range Comment   Glucose-Capillary 124 (*) 70 - 99 mg/dL    Comment 1 Documented in Chart     GLUCOSE, CAPILLARY     Status: Abnormal   Collection Time   12/05/12  9:32 AM      Component Value Range Comment   Glucose-Capillary 131 (*) 70 - 99 mg/dL    Comment 1 Documented in Chart     GLUCOSE, CAPILLARY     Status: Abnormal   Collection Time   12/05/12 10:35 AM      Component Value Range Comment   Glucose-Capillary 123 (*) 70 - 99 mg/dL   GLUCOSE, CAPILLARY     Status: Normal   Collection Time   12/05/12 12:01 PM      Component Value Range Comment   Glucose-Capillary 99  70 - 99 mg/dL   GLUCOSE, CAPILLARY     Status: Abnormal   Collection Time   12/05/12  1:09 PM      Component Value Range Comment   Glucose-Capillary 133 (*) 70 - 99 mg/dL   GLUCOSE, CAPILLARY     Status: Abnormal   Collection Time   12/05/12  1:52 PM      Component Value Range Comment   Glucose-Capillary 128 (*) 70 - 99 mg/dL    Comment 1 Documented in Chart     MAGNESIUM     Status: Abnormal   Collection Time   12/05/12  4:00 PM      Component Value Range Comment   Magnesium 2.8 (*) 1.5 - 2.5 mg/dL   CBC     Status: Abnormal   Collection Time   12/05/12  4:00 PM      Component Value Range Comment   WBC 19.9 (*) 4.0 - 10.5 K/uL    RBC 4.04  3.87 - 5.11 MIL/uL    Hemoglobin  10.8 (*) 12.0 - 15.0 g/dL    HCT  13.0 (*) 86.5 - 46.0 %    MCV 82.2  78.0 - 100.0 fL    MCH 26.7  26.0 - 34.0 pg    MCHC 32.5  30.0 - 36.0 g/dL    RDW 78.4 (*) 69.6 - 15.5 %    Platelets 173  150 - 400 K/uL   CREATININE, SERUM     Status: Abnormal   Collection Time   12/05/12  4:00 PM      Component Value Range Comment   Creatinine, Ser 1.04  0.50 - 1.10 mg/dL    GFR calc non Af Amer 52 (*) >90 mL/min    GFR calc Af Amer 60 (*) >90 mL/min   POCT I-STAT, CHEM 8     Status: Abnormal   Collection Time   12/05/12  4:28 PM      Component Value Range Comment   Sodium 137  135 - 145 mEq/L    Potassium 4.8  3.5 - 5.1 mEq/L    Chloride 106  96 - 112 mEq/L    BUN 14  6 - 23 mg/dL    Creatinine, Ser 2.95  0.50 - 1.10 mg/dL    Glucose, Bld 284 (*) 70 - 99 mg/dL    Calcium, Ion 1.32  4.40 - 1.30 mmol/L    TCO2 23  0 - 100 mmol/L    Hemoglobin 11.2 (*) 12.0 - 15.0 g/dL    HCT 10.2 (*) 72.5 - 46.0 %   GLUCOSE, CAPILLARY     Status: Abnormal   Collection Time   12/05/12  7:46 PM      Component Value Range Comment   Glucose-Capillary 160 (*) 70 - 99 mg/dL   GLUCOSE, CAPILLARY     Status: Abnormal   Collection Time   12/06/12 12:16 AM      Component Value Range Comment   Glucose-Capillary 114 (*) 70 - 99 mg/dL   GLUCOSE, CAPILLARY     Status: Abnormal   Collection Time   12/06/12  3:58 AM      Component Value Range Comment   Glucose-Capillary 114 (*) 70 - 99 mg/dL   BASIC METABOLIC PANEL     Status: Abnormal   Collection Time   12/06/12  4:26 AM      Component Value Range Comment   Sodium 133 (*) 135 - 145 mEq/L    Potassium 4.7  3.5 - 5.1 mEq/L    Chloride 99  96 - 112 mEq/L    CO2 24  19 - 32 mEq/L    Glucose, Bld 133 (*) 70 - 99 mg/dL    BUN 17  6 - 23 mg/dL    Creatinine, Ser 3.66 (*) 0.50 - 1.10 mg/dL    Calcium 8.9  8.4 - 44.0 mg/dL    GFR calc non Af Amer 48 (*) >90 mL/min    GFR calc Af Amer 55 (*) >90 mL/min   CBC     Status: Abnormal   Collection Time   12/06/12  4:26 AM      Component Value Range Comment    WBC 19.2 (*) 4.0 - 10.5 K/uL    RBC 4.03  3.87 - 5.11 MIL/uL    Hemoglobin 10.8 (*) 12.0 - 15.0 g/dL    HCT 34.7 (*) 42.5 - 46.0 %    MCV 82.1  78.0 - 100.0 fL    MCH 26.8  26.0 - 34.0 pg  MCHC 32.6  30.0 - 36.0 g/dL    RDW 16.1 (*) 09.6 - 15.5 %    Platelets 157  150 - 400 K/uL   GLUCOSE, CAPILLARY     Status: Abnormal   Collection Time   12/06/12  7:52 AM      Component Value Range Comment   Glucose-Capillary 132 (*) 70 - 99 mg/dL   GLUCOSE, CAPILLARY     Status: Abnormal   Collection Time   12/06/12 11:58 AM      Component Value Range Comment   Glucose-Capillary 146 (*) 70 - 99 mg/dL   GLUCOSE, CAPILLARY     Status: Abnormal   Collection Time   12/06/12  5:22 PM      Component Value Range Comment   Glucose-Capillary 117 (*) 70 - 99 mg/dL   GLUCOSE, CAPILLARY     Status: Abnormal   Collection Time   12/06/12  7:47 PM      Component Value Range Comment   Glucose-Capillary 129 (*) 70 - 99 mg/dL   GLUCOSE, CAPILLARY     Status: Abnormal   Collection Time   12/06/12 11:59 PM      Component Value Range Comment   Glucose-Capillary 121 (*) 70 - 99 mg/dL   GLUCOSE, CAPILLARY     Status: Abnormal   Collection Time   12/07/12  3:15 AM      Component Value Range Comment   Glucose-Capillary 113 (*) 70 - 99 mg/dL   BASIC METABOLIC PANEL     Status: Abnormal   Collection Time   12/07/12  3:50 AM      Component Value Range Comment   Sodium 133 (*) 135 - 145 mEq/L    Potassium 5.1  3.5 - 5.1 mEq/L    Chloride 100  96 - 112 mEq/L    CO2 25  19 - 32 mEq/L    Glucose, Bld 125 (*) 70 - 99 mg/dL    BUN 25 (*) 6 - 23 mg/dL    Creatinine, Ser 0.45 (*) 0.50 - 1.10 mg/dL    Calcium 8.9  8.4 - 40.9 mg/dL    GFR calc non Af Amer 45 (*) >90 mL/min    GFR calc Af Amer 52 (*) >90 mL/min   CBC     Status: Abnormal   Collection Time   12/07/12  3:50 AM      Component Value Range Comment   WBC 14.9 (*) 4.0 - 10.5 K/uL    RBC 3.94  3.87 - 5.11 MIL/uL    Hemoglobin 10.5 (*) 12.0 - 15.0 g/dL    HCT 81.1  (*) 91.4 - 46.0 %    MCV 82.5  78.0 - 100.0 fL    MCH 26.6  26.0 - 34.0 pg    MCHC 32.3  30.0 - 36.0 g/dL    RDW 78.2 (*) 95.6 - 15.5 %    Platelets 178  150 - 400 K/uL   GLUCOSE, CAPILLARY     Status: Abnormal   Collection Time   12/07/12  7:50 AM      Component Value Range Comment   Glucose-Capillary 112 (*) 70 - 99 mg/dL     Imaging: Dg Chest Portable 1 View In Am  12/06/2012  *RADIOLOGY REPORT*  Clinical Data: Postop cardiac surgery  PORTABLE CHEST - 1 VIEW  Comparison: Prior chest x-ray 12/05/2012  Findings: Interval removal of Swan-Ganz catheter, mediastinal drain and left-sided chest tube.  The right IJ vascular sheath remains in unchanged  position with the tip projecting over the superior vena cava.  Epicardial pacing leads remain in place.  No evidence of pneumothorax.  Slight lower inspiratory volumes with persistent bibasilar atelectasis.  Trace bilateral pleural effusions.  Unchanged cardiomegaly with evidence of left atrial enlargement.  Status post median sternotomy for multivessel CABG.  IMPRESSION:  1.  Interval removal of mediastinal drain, left thoracostomy tube and Swan-Ganz catheter. 2.  Persistent trace bilateral pleural effusions and bibasilar atelectasis.   Original Report Authenticated By: Malachy Moan, M.D.     Assessment:  1. Principal Problem: 2.  *NSTEMI (non-ST elevated myocardial infarction) 3. Active Problems: 4.  PAD (peripheral artery disease) 5.  CAD (coronary artery disease): 1993 PTCA to circumflex.  2002 2 BMS stents to ostial, S/P CABG x 3 12/04/12 6.  DM2 (diabetes mellitus, type 2) 7.  HTN (hypertension) 8.  HLD (hyperlipidemia) 9.  TR (tricuspid regurgitation), severe 12/02/12 for repair with CABG 10.  Pulmonary HTN 11.   Plan:  1. Continues with underlying atrial fibrillation and slow ventricular response in the 40's - now with increasing loss of capture, suggesting either loose pacer leads or edema which may alter pacing threshold. I'm not  sure she is tolerant of amiodarone - she has been off b-blocker and remains bradycardic, but has had intermittent a-fib with RVR suggestive of a "sick-sinus syndrome" - she also has had syncope in the past due to this.  One option would be to consider discontinuing amiodarone and consider adding digoxin for a rate control. This also begs the question as to whether she will ultimately need a permanent pacemaker - i think it is too early to determine that at this point.  Time Spent Directly with Patient:  15 minutes  Length of Stay:  LOS: 10 days   Chrystie Nose, MD, [facility] Attending Cardiologist [facility]  HILTY,Kenneth C 12/07/2012, 8:14 AM

## 2012-12-07 NOTE — Progress Notes (Signed)
Patient ID: Kendra Todd, female   DOB: April 10, 1941, 72 y.o.   MRN: [id number]  SICU Evening Rounds:  Hemodynamically stable but back in A-fib this afternoon. Rate gets up to 120-140 with ambulation. I will increase the oral amio to 400 bid. Start coumadin. She has hx of a-fib and prior stroke.

## 2012-12-08 ENCOUNTER — Inpatient Hospital Stay (HOSPITAL_COMMUNITY): Payer: Medicare Other

## 2012-12-08 LAB — CBC
HCT: 31.5 % — ABNORMAL LOW (ref 36.0–46.0)
MCHC: 32.1 g/dL (ref 30.0–36.0)
MCV: 82.7 fL (ref 78.0–100.0)
RDW: 16.8 % — ABNORMAL HIGH (ref 11.5–15.5)

## 2012-12-08 LAB — GLUCOSE, CAPILLARY
Glucose-Capillary: 102 mg/dL — ABNORMAL HIGH (ref 70–99)
Glucose-Capillary: 108 mg/dL — ABNORMAL HIGH (ref 70–99)
Glucose-Capillary: 150 mg/dL — ABNORMAL HIGH (ref 70–99)

## 2012-12-08 LAB — PROTIME-INR
INR: 1.39 (ref 0.00–1.49)
Prothrombin Time: 16.7 seconds — ABNORMAL HIGH (ref 11.6–15.2)

## 2012-12-08 LAB — BASIC METABOLIC PANEL
BUN: 33 mg/dL — ABNORMAL HIGH (ref 6–23)
CO2: 26 mEq/L (ref 19–32)
Chloride: 99 mEq/L (ref 96–112)
Creatinine, Ser: 1.11 mg/dL — ABNORMAL HIGH (ref 0.50–1.10)
Glucose, Bld: 126 mg/dL — ABNORMAL HIGH (ref 70–99)

## 2012-12-08 MED ORDER — WARFARIN SODIUM 2.5 MG PO TABS
2.5000 mg | ORAL_TABLET | Freq: Every day | ORAL | Status: DC
  Administered 2012-12-08: 2.5 mg via ORAL
  Filled 2012-12-08 (×3): qty 1

## 2012-12-08 MED ORDER — MOVING RIGHT ALONG BOOK
Freq: Once | Status: AC
  Administered 2012-12-08: 17:00:00
  Filled 2012-12-08: qty 1

## 2012-12-08 MED ORDER — DIGOXIN 0.25 MG/ML IJ SOLN
0.1250 mg | Freq: Every day | INTRAMUSCULAR | Status: AC
  Administered 2012-12-08: 0.125 mg via INTRAVENOUS
  Filled 2012-12-08: qty 0.5

## 2012-12-08 MED ORDER — MORPHINE SULFATE 2 MG/ML IJ SOLN: 2.0000 mg | INTRAMUSCULAR | Status: DC | PRN

## 2012-12-08 MED ORDER — HYDROCODONE-ACETAMINOPHEN 5-325 MG PO TABS: 1.0000 | ORAL_TABLET | ORAL | Status: DC | PRN

## 2012-12-08 MED ORDER — SODIUM CHLORIDE 0.9 % IJ SOLN
3.0000 mL | Freq: Two times a day (BID) | INTRAMUSCULAR | Status: DC
  Administered 2012-12-09 – 2012-12-11 (×3): 3 mL via INTRAVENOUS

## 2012-12-08 MED ORDER — METOPROLOL TARTRATE 12.5 MG HALF TABLET
12.5000 mg | ORAL_TABLET | Freq: Two times a day (BID) | ORAL | Status: DC
  Administered 2012-12-08 – 2012-12-11 (×7): 12.5 mg via ORAL
  Filled 2012-12-08 (×8): qty 1

## 2012-12-08 MED ORDER — DIGOXIN 0.0625 MG HALF TABLET
0.0625 mg | ORAL_TABLET | Freq: Every day | ORAL | Status: DC
  Administered 2012-12-09: 0.0625 mg via ORAL
  Filled 2012-12-08 (×2): qty 1

## 2012-12-08 MED ORDER — FUROSEMIDE 80 MG PO TABS
80.0000 mg | ORAL_TABLET | Freq: Every day | ORAL | Status: DC
  Administered 2012-12-09 – 2012-12-11 (×3): 80 mg via ORAL
  Filled 2012-12-08 (×3): qty 1

## 2012-12-08 MED ORDER — INSULIN ASPART 100 UNIT/ML ~~LOC~~ SOLN
0.0000 [IU] | Freq: Three times a day (TID) | SUBCUTANEOUS | Status: DC
  Administered 2012-12-10: 2 [IU] via SUBCUTANEOUS

## 2012-12-08 MED ORDER — SODIUM CHLORIDE 0.9 % IJ SOLN: 3.0000 mL | INTRAMUSCULAR | Status: DC | PRN

## 2012-12-08 MED FILL — Mannitol IV Soln 20%: INTRAVENOUS | Qty: 500 | Status: AC

## 2012-12-08 MED FILL — Heparin Sodium (Porcine) Inj 1000 Unit/ML: INTRAMUSCULAR | Qty: 30 | Status: AC

## 2012-12-08 MED FILL — Lidocaine HCl IV Inj 20 MG/ML: INTRAVENOUS | Qty: 5 | Status: AC

## 2012-12-08 MED FILL — Sodium Bicarbonate IV Soln 8.4%: INTRAVENOUS | Qty: 50 | Status: AC

## 2012-12-08 MED FILL — Electrolyte-R (PH 7.4) Solution: INTRAVENOUS | Qty: 4000 | Status: AC

## 2012-12-08 MED FILL — Sodium Chloride Irrigation Soln 0.9%: Qty: 3000 | Status: AC

## 2012-12-08 MED FILL — Heparin Sodium (Porcine) Inj 1000 Unit/ML: INTRAMUSCULAR | Qty: 20 | Status: AC

## 2012-12-08 MED FILL — Sodium Chloride IV Soln 0.9%: INTRAVENOUS | Qty: 1000 | Status: AC

## 2012-12-08 NOTE — Progress Notes (Signed)
[facility] Progress Note  Subjective:  Day 4 s/p CABG x3  Objective:   Vital Signs in the last 24 hours: Temp:  [97.6 F (36.4 C)-98.1 F (36.7 C)] 98.1 F (36.7 C) (02/03 1153) Pulse Rate:  [26-137] 114  (02/03 1200) Resp:  [14-35] 15  (02/03 1200) BP: (80-138)/(37-125) 119/71 mmHg (02/03 1200) SpO2:  [92 %-100 %] 100 % (02/03 1200) Weight:  [73.2 kg (161 lb 6 oz)] 73.2 kg (161 lb 6 oz) (02/03 0500)  Intake/Output from previous day: 02/02 0701 - 02/03 0700 In: 498 [I.V.:480; IV Piggyback:18] Out: 2075 [Urine:2075]  Scheduled:   . acetaminophen  1,000 mg Oral Q6H   Or  . acetaminophen (TYLENOL) oral liquid 160 mg/5 mL  975 mg Per Tube Q6H  . albumin human  12.5 g Intravenous Once  . amiodarone  400 mg Oral BID  . aspirin EC  325 mg Oral Daily  . atorvastatin  40 mg Oral q1800  . bisacodyl  10 mg Oral Daily   Or  . bisacodyl  10 mg Rectal Daily  . budesonide-formoterol  2 puff Inhalation BID  . digoxin  0.0625 mg Oral Daily  . docusate sodium  200 mg Oral Daily  . feeding supplement  1 Container Oral TID BM  . furosemide  80 mg Intravenous BID  . insulin aspart  0-24 Units Subcutaneous Q4H  . insulin detemir  12 Units Subcutaneous Q1200  . insulin regular  0-10 Units Intravenous TID WC  . pantoprazole  40 mg Oral Daily  . sodium chloride  3 mL Intravenous Q12H  . warfarin  2.5 mg Oral q1800  . Warfarin - Physician Dosing Inpatient   Does not apply q1800     Physical Exam:   General appearance: alert, cooperative and no distress Neck: no JVD, supple, symmetrical, trachea midline and thyroid not enlarged, symmetric, no tenderness/mass/nodules Lungs: decreased BS at bases Heart: irregularly irregular rhythm Abdomen: soft, non-tender; bowel sounds normal; no masses,  no organomegaly Extremities: trivial edema   Rate: 105  Rhythm: atrial fibrillation  Lab Results:    Basename 12/08/12 0428 12/07/12 0350  NA 133* 133*  K 4.7  5.1  CL 99 100  CO2 26 25  GLUCOSE 126* 125*  BUN 33* 25*  CREATININE 1.11* 1.17*   No results found for this basename: TROPONINI:2,CK,MB:2 in the last 72 hours Hepatic Function Panel No results found for this basename: PROT,ALBUMIN,AST,ALT,ALKPHOS,BILITOT,BILIDIR,IBILI in the last 72 hours  Basename 12/08/12 0428  INR 1.39    Lipid Panel     Component Value Date/Time   CHOL 160 11/28/2012 0239   TRIG 59 11/28/2012 0239   HDL 49 11/28/2012 0239   CHOLHDL 3.3 11/28/2012 0239   VLDL 12 11/28/2012 0239   LDLCALC 99 11/28/2012 0239     Imaging:  Dg Chest Port 1 View  12/08/2012  *RADIOLOGY REPORT*  Clinical Data: CABG.  PORTABLE CHEST - 1 VIEW  Comparison: 12/06/2012  Findings: Right internal jugular venous access sheath remains in place.  Previous median sternotomy and CABG.  Atelectasis remains evident in both lower lobes, slightly more pronounced.  There are probably small effusions, possibly enlarging.  IMPRESSION: Slightly more atelectasis at the lung bases.  Question enlarging effusions.   Original Report Authenticated By: Paulina Fusi, M.D.       Assessment/Plan:   Principal Problem:  *NSTEMI (non-ST elevated myocardial infarction) Active Problems:  PAD (peripheral artery disease)  CAD (coronary artery disease): 1993 PTCA to  circumflex.  2002 2 BMS stents to ostial, S/P CABG x 3 12/04/12  DM2 (diabetes mellitus, type 2)  HTN (hypertension)  HLD (hyperlipidemia)  TR (tricuspid regurgitation), severe 12/02/12 for repair with CABG  Pulmonary HTN   Day 4 s/p CABG x3. Currently in AF with rate ~100. On amiodarone at 400 mg bid; will add lopressor for additional rate control. Ambulating. 3 kg diuresis from yesterday but still 5 kg up from pre-op weight. Continue diuresis.   Lennette Bihari, MD, [facility] 12/08/2012, 12:56 PM

## 2012-12-08 NOTE — Progress Notes (Signed)
Transferred to room 2018 via wheelchair. Pt was alert and oriented x3 with no c/o pain. Transferred with minimal assistance from wheelchair to bed.

## 2012-12-08 NOTE — Progress Notes (Signed)
Patient's R IJ sleeve D/C'd per order and per protocol. Vaseline gauze and pressure dressing applied after hemostasis obtained. Instructed patient to remain flat in the bed for 30 min.

## 2012-12-08 NOTE — Progress Notes (Signed)
UR Completed.  Cephus Tupy Jane [phone] 12/08/2012  

## 2012-12-08 NOTE — Progress Notes (Signed)
4 Days Post-Op Procedure(s) (LRB): CORONARY ARTERY BYPASS GRAFTING (CABG) (N/A) INTRAOPERATIVE TRANSESOPHAGEAL ECHOCARDIOGRAM (N/A) Subjective: CABGx3 for L main Hx DM, HTN, active smoking with COPD Postop AFIB- coumadin started     AFIB. Rate 110/min Objective: Vital signs in last 24 hours: Temp:  [97.6 F (36.4 C)-98.1 F (36.7 C)] 98.1 F (36.7 C) (02/03 1153) Pulse Rate:  [26-137] 114  (02/03 1200) Cardiac Rhythm:  [-] Atrial fibrillation (02/03 1200) Resp:  [14-35] 15  (02/03 1200) BP: (93-138)/(37-125) 119/71 mmHg (02/03 1200) SpO2:  [92 %-100 %] 100 % (02/03 1200) Weight:  [161 lb 6 oz (73.2 kg)] 161 lb 6 oz (73.2 kg) (02/03 0500)  Hemodynamic parameters for last 24 hours:  afib  Intake/Output from previous day: 02/02 0701 - 02/03 0700 In: 498 [I.V.:480; IV Piggyback:18] Out: 2075 [Urine:2075] Intake/Output this shift: Total I/O In: 128 [P.O.:100; I.V.:20; IV Piggyback:8] Out: 675 [Urine:675]  Lungs clear, extrem warm  Lab Results:  Basename 12/08/12 0428 12/07/12 0350  WBC 8.7 14.9*  HGB 10.1* 10.5*  HCT 31.5* 32.5*  PLT 200 178   BMET:  Basename 12/08/12 0428 12/07/12 0350  NA 133* 133*  K 4.7 5.1  CL 99 100  CO2 26 25  GLUCOSE 126* 125*  BUN 33* 25*  CREATININE 1.11* 1.17*  CALCIUM 8.7 8.9    PT/INR:  Basename 12/08/12 0428  LABPROT 16.7*  INR 1.39   ABG    Component Value Date/Time   PHART 7.360 12/05/2012 0122   HCO3 22.4 12/05/2012 0122   TCO2 23 12/05/2012 1628   ACIDBASEDEF 3.0* 12/05/2012 0122   O2SAT 97.0 12/05/2012 0122   CBG (last 3)   Basename 12/08/12 1133 12/08/12 0741 12/08/12 0345  GLUCAP 150* 108* 102*    Assessment/Plan: S/P Procedure(s) (LRB): CORONARY ARTERY BYPASS GRAFTING (CABG) (N/A) INTRAOPERATIVE TRANSESOPHAGEAL ECHOCARDIOGRAM (N/A) Plan for transfer to step-down: see transfer orders Cont lasix, coumadin- may need SNF- PT eval requested  LOS: 11 days    VAN TRIGT III,Gaylyn Berish 12/08/2012

## 2012-12-09 ENCOUNTER — Inpatient Hospital Stay (HOSPITAL_COMMUNITY): Payer: Medicare Other

## 2012-12-09 LAB — CBC
HCT: 29.7 % — ABNORMAL LOW (ref 36.0–46.0)
Hemoglobin: 9.5 g/dL — ABNORMAL LOW (ref 12.0–15.0)
MCH: 26.2 pg (ref 26.0–34.0)
MCHC: 32 g/dL (ref 30.0–36.0)
MCV: 82 fL (ref 78.0–100.0)
Platelets: 222 10*3/uL (ref 150–400)
RBC: 3.62 MIL/uL — ABNORMAL LOW (ref 3.87–5.11)
RDW: 16.9 % — ABNORMAL HIGH (ref 11.5–15.5)
WBC: 4.6 10*3/uL (ref 4.0–10.5)

## 2012-12-09 LAB — GLUCOSE, CAPILLARY
Glucose-Capillary: 83 mg/dL (ref 70–99)
Glucose-Capillary: 120 mg/dL — ABNORMAL HIGH (ref 70–99)
Glucose-Capillary: 89 mg/dL (ref 70–99)
Glucose-Capillary: 116 mg/dL — ABNORMAL HIGH (ref 70–99)

## 2012-12-09 LAB — BASIC METABOLIC PANEL
BUN: 40 mg/dL — ABNORMAL HIGH (ref 6–23)
CO2: 27 mEq/L (ref 19–32)
Calcium: 9.2 mg/dL (ref 8.4–10.5)
Chloride: 97 mEq/L (ref 96–112)
Creatinine, Ser: 1.28 mg/dL — ABNORMAL HIGH (ref 0.50–1.10)
GFR calc Af Amer: 47 mL/min — ABNORMAL LOW (ref >90)
GFR calc non Af Amer: 40 mL/min — ABNORMAL LOW (ref >90)
Glucose, Bld: 91 mg/dL (ref 70–99)
Potassium: 5.1 mEq/L (ref 3.5–5.1)
Sodium: 135 mEq/L (ref 135–145)

## 2012-12-09 LAB — PROTIME-INR
INR: 3.37 — ABNORMAL HIGH (ref 0.00–1.49)
Prothrombin Time: 32.2 seconds — ABNORMAL HIGH (ref 11.6–15.2)

## 2012-12-09 NOTE — Progress Notes (Signed)
CARDIAC REHAB PHASE I   PRE:  Rate/Rhythm: 78SR  BP:  Supine: 150/84  Sitting:   Standing:    SaO2: 100% 3.5L  MODE:  Ambulation: 190 ft   POST:  Rate/Rhythem: 93SR  BP:  Supine: 150/80  Sitting:   Standing:    SaO2: 100% 3L 1420-1445 Pt walked 190 ft on 3L with rolling walker and asst x 1 with slow steady gait. Tired easily. Did not stop to rest. Back to bed after walk. C/o being tired from lack of sleep. Pt states she feels SOB when walking. Will try to wean next walks.  Duanne Limerick

## 2012-12-09 NOTE — Progress Notes (Signed)
NUTRITION FOLLOW UP  Intervention:   Continue Ensure Pudding 3 times daily (170 kcals, 4 gm protein per 4 oz cup) RD to follow for nutrition care plan  Nutrition Dx:   Unintentional weight loss related to poor po intake as evidenced by MST report, ongoing  Goal:   Oral intake with meals & supplements to meet >/= 90% of estimated nutrition needs, progressing  Monitor:   PO & supplemental intake, weight, labs, I/O's  Assessment:   Patient s/p procedures 1/30: INTRAOPERATIVE TRANSESOPHAGEAL ECHOCARDIOGRAM CORONARY ARTERY BYPASS GRAFTING (CABG) x 3  Patient currently in diagnostic Radiology.  PO intake variable at 25-75% per flowsheet records.  Receiving Lasix.  Ensure Pudding supplement ordered 3 times daily per MD 2/1.  Height: Ht Readings from Last 1 Encounters:  11/27/12 5\' 2"  (1.575 m)    Weight Status:   Wt Readings from Last 1 Encounters:  12/09/12 160 lb 15 oz (73 kg)    Re-estimated needs:  Kcal: 1400-1600 Protein: 65-75 gm Fluid: > 1.5 L  Skin: Intact  Diet Order: Carb Control   Intake/Output Summary (Last 24 hours) at 12/09/12 1156 Last data filed at 12/09/12 0900  Gross per 24 hour  Intake    600 ml  Output   1125 ml  Net   -525 ml    Last BM: 2/3  Labs:   Lab 12/09/12 0535 12/08/12 0428 12/07/12 0350 12/05/12 1600 12/05/12 0320 12/04/12 1915  NA 135 133* 133* -- -- --  K 5.1 4.7 5.1 -- -- --  CL 97 99 100 -- -- --  CO2 27 26 25  -- -- --  BUN 40* 33* 25* -- -- --  CREATININE 1.28* 1.11* 1.17* -- -- --  CALCIUM 9.2 8.7 8.9 -- -- --  MG -- -- -- 2.8* 2.8* 3.2*  PHOS -- -- -- -- -- --  GLUCOSE 91 126* 125* -- -- --    CBG (last 3)   Basename 12/09/12 1117 12/09/12 0607 12/08/12 2038  GLUCAP 120* 83 92    Scheduled Meds:   . acetaminophen  1,000 mg Oral Q6H  . amiodarone  400 mg Oral BID  . aspirin EC  325 mg Oral Daily  . atorvastatin  40 mg Oral q1800  . bisacodyl  10 mg Oral Daily   Or  . bisacodyl  10 mg Rectal Daily  .  budesonide-formoterol  2 puff Inhalation BID  . digoxin  0.0625 mg Oral Daily  . docusate sodium  200 mg Oral Daily  . feeding supplement  1 Container Oral TID BM  . furosemide  80 mg Oral Daily  . insulin aspart  0-24 Units Subcutaneous TID AC & HS  . insulin detemir  12 Units Subcutaneous Q1200  . metoprolol tartrate  12.5 mg Oral BID  . pantoprazole  40 mg Oral Daily  . sodium chloride  3 mL Intravenous Q12H  . Warfarin - Physician Dosing Inpatient   Does not apply q1800    Continuous Infusions:   Maureen Chatters, RD, LDN Pager #: [phone] After-Hours Pager #: [phone]

## 2012-12-09 NOTE — Progress Notes (Signed)
Occupational Therapy Evaluation Patient Details Name: SHANAIYA BENE MRN: [id number] DOB: Jan 22, 1941 Today's Date: 12/09/2012 Time: 1700-1715 OT Time Calculation (min): 15 min  OT Assessment / Plan / Recommendation Clinical Impression  72 yo s/p CABG x 3. PTA, lived alone. Pt will have 24/7 assistance at D/C. Rec 3 in 1 for home use. Educated pt on sternotomy precautions regarding ADL. PT able to verbalize but requires mod vc to follow during bed mobility. Pt will benefit from skilled OT services to max safety and independence with ADL and functional moiblity for ADL adhering to sternal precautions to facilitate D/C home.    OT Assessment  Patient needs continued OT Services    Follow Up Recommendations  No OT follow up    Barriers to Discharge None    Equipment Recommendations  3 in 1 bedside comode    Recommendations for Other Services    Frequency  Min 2X/week    Precautions / Restrictions Precautions Precautions: Sternal Restrictions Weight Bearing Restrictions: Yes Other Position/Activity Restrictions: sternal   Pertinent Vitals/Pain 4. chest    ADL  Grooming: Minimal assistance Where Assessed - Grooming: Unsupported sitting Upper Body Bathing: Minimal assistance Where Assessed - Upper Body Bathing: Unsupported sitting Lower Body Bathing: Moderate assistance Where Assessed - Lower Body Bathing: Supported sit to stand Upper Body Dressing: Minimal assistance Where Assessed - Upper Body Dressing: Unsupported sitting Lower Body Dressing: Moderate assistance Where Assessed - Lower Body Dressing: Supported sit to stand Toilet Transfer: Hydrographic surveyor Method: Sit to stand;Stand Wellsite geologist: Materials engineer and Hygiene: Modified independent Where Assessed - Engineer, mining and Hygiene: Standing Transfers/Ambulation Related to ADLs: minguard ADL Comments: Will benefit from3 in 1. Niece will  assist as needed wit ADl. Reviewed strenal precautions    OT Diagnosis: Generalized weakness;Acute pain  OT Problem List: Decreased strength;Decreased activity tolerance;Decreased knowledge of use of DME or AE;Decreased knowledge of precautions;Cardiopulmonary status limiting activity;Pain OT Treatment Interventions: Self-care/ADL training;Energy conservation;DME and/or AE instruction;Therapeutic activities;Patient/family education   OT Goals Acute Rehab OT Goals OT Goal Formulation: With patient Time For Goal Achievement: 12/23/12 Potential to Achieve Goals: Good ADL Goals Additional ADL Goal #1: Pt will demonstrate ability to independently instruct caregiver in sternal precautions for bed mobility and ADL. ADL Goal: Additional Goal #1 - Progress: Goal set today Additional ADL Goal #2: Pt will verbalize 2 E conservation techniques for ADL ADL Goal: Additional Goal #2 - Progress: Goal set today  Visit Information  Last OT Received On: 12/09/12 Assistance Needed: +1    Subjective Data  Subjective: My niece will do everything for me   Prior Functioning     Home Living Lives With: Alone Available Help at Discharge: Available 24 hours/day (niece to stay with pt for 6 weeks) Type of Home: House Home Access: Level entry Home Layout: One level Bathroom Shower/Tub: Health visitor: Handicapped height Bathroom Accessibility: Yes How Accessible: Accessible via walker Home Adaptive Equipment: Grab bars around toilet;Grab bars in shower;Hand-held shower hose;Built-in shower seat;Straight cane;Walker - four wheeled Additional Comments: Niece is planning to stay with pt 2 weeks/or as long as needed. Can be there 24/7 Prior Function Level of Independence: Independent with assistive device(s) (used cane when ambulating long distance stores) Able to Take Stairs?: No Driving: Yes Vocation: Retired Musician: No difficulties Dominant Hand: Right          Vision/Perception Vision - History Baseline Vision: No visual deficits Perception Perception: Within Functional Limits  Praxis Praxis: Intact   Cognition  Cognition Overall Cognitive Status: Appears within functional limits for tasks assessed/performed Arousal/Alertness: Awake/alert Orientation Level: Appears intact for tasks assessed Behavior During Session: [facility] for tasks performed    Extremity/Trunk Assessment Right Upper Extremity Assessment RUE ROM/Strength/Tone: WFL for tasks assessed RUE Sensation: WFL - Light Touch;WFL - Proprioception RUE Coordination: WFL - gross/fine motor Left Upper Extremity Assessment LUE ROM/Strength/Tone: WFL for tasks assessed LUE Sensation: WFL - Light Touch;WFL - Proprioception LUE Coordination: WFL - gross/fine motor Right Lower Extremity Assessment RLE ROM/Strength/Tone: Within functional levels Left Lower Extremity Assessment LLE ROM/Strength/Tone: Within functional levels     Mobility Bed Mobility Bed Mobility: Rolling Left;Right Sidelying to Sit;Sitting - Scoot to Edge of Bed;Sit to Sidelying Left Rolling Left: 5: Supervision Sit to Sidelying Left: 4: Min guard Details for Bed Mobility Assistance: Cues for holding pillow to maintain sternal precautions. Transfers Transfers: Sit to Stand;Stand to Sit Sit to Stand: 4: Min guard;Without upper extremity assist;From bed Stand to Sit: 4: Min guard;Without upper extremity assist;To bed     Exercise     Balance  WFL   End of Session OT - End of Session Activity Tolerance: Patient tolerated treatment well Patient left: in bed;with call bell/phone within reach Nurse Communication: Mobility status  GO     Lizania Bouchard,HILLARY 12/09/2012, 5:23 PM [facility], OTR/L  [phone] 12/09/2012

## 2012-12-09 NOTE — Evaluation (Signed)
Physical Therapy Evaluation Patient Details Name: Kendra Todd MRN: [id number] DOB: 02-Aug-1941 Today's Date: 12/09/2012 Time: 4098-1191 PT Time Calculation (min): 30 min  PT Assessment / Plan / Recommendation Clinical Impression  Pt s/p MI and CABG x 3 with decr mobility secondary to decr endurance and need for education re: sternal precautions.  Has equipment and 24 hour care arranged.  May need HHPT.  THanks.    PT Assessment  Patient needs continued PT services    Follow Up Recommendations  Home health PT                Equipment Recommendations  None recommended by PT         Frequency Min 3X/week    Precautions / Restrictions Precautions Precautions: Sternal Precaution Comments: gave precaution handout and discussed with patient Restrictions Weight Bearing Restrictions: No   Pertinent Vitals/Pain VSS, No pain     Mobility  Bed Mobility Bed Mobility: Supine to Sit Supine to Sit: 4: Min guard;HOB elevated Details for Bed Mobility Assistance: Cues for holding pillow to maintain sternal precautions. Transfers Transfers: Sit to Stand;Stand to Sit Sit to Stand: 4: Min guard;Without upper extremity assist;From bed Stand to Sit: 4: Min guard;Without upper extremity assist;To bed Details for Transfer Assistance: cues for hand placement and to use LEs. Ambulation/Gait Ambulation/Gait Assistance: 4: Min guard Ambulation Distance (Feet): 200 Feet Assistive device: Rolling walker Ambulation/Gait Assistance Details: Pt needed occasional cues to stay close to RW.  Pt with steady gait overall.  Cued for pursed lip breathing.  Able to ambulate off of O2 on RA with sats >91%.  Trunk slightly flexed with ambulation as well.   Gait Pattern: Step-through pattern;Decreased stride length;Trunk flexed;Decreased step length - right;Decreased step length - left Gait velocity: decreased Stairs: No Wheelchair Mobility Wheelchair Mobility: No         PT Diagnosis: Generalized weakness   PT Problem List: Decreased activity tolerance;Decreased balance;Decreased mobility;Decreased safety awareness;Decreased knowledge of precautions;Decreased knowledge of use of DME PT Treatment Interventions: DME instruction;Gait training;Functional mobility training;Therapeutic activities;Therapeutic exercise;Balance training;Patient/family education   PT Goals Acute Rehab PT Goals PT Goal Formulation: With patient Time For Goal Achievement: 12/16/12 Potential to Achieve Goals: Good Pt will go Supine/Side to Sit: Independently PT Goal: Supine/Side to Sit - Progress: Goal set today Pt will go Sit to Stand: Independently PT Goal: Sit to Stand - Progress: Goal set today Pt will Transfer Bed to Chair/Chair to Bed: Independently PT Transfer Goal: Bed to Chair/Chair to Bed - Progress: Goal set today Pt will Ambulate: 51 - 150 feet;with modified independence;with least restrictive assistive device PT Goal: Ambulate - Progress: Goal set today Pt will Perform Home Exercise Program: with supervision, verbal cues required/provided PT Goal: Perform Home Exercise Program - Progress: Goal set today Additional Goals Additional Goal #1: Pt to follow sternal precautions with all mobility without cuing. PT Goal: Additional Goal #1 - Progress: Goal set today  Visit Information  Last PT Received On: 12/09/12 Assistance Needed: +1    Subjective Data  Subjective: "I feel like I am going to be fine." Patient Stated Goal: To go home   Prior Functioning  Home Living Lives With: Alone Available Help at Discharge: Available 24 hours/day (niece to stay with pt for 6 weeks) Type of Home: House Home Access: Level entry Home Layout: One level Bathroom Shower/Tub: Health visitor: Handicapped height Home Adaptive Equipment: Grab bars around toilet;Grab bars in shower;Hand-held shower hose;Built-in shower seat;Straight cane;Walker - four wheeled Prior  Function Level of Independence:  Independent with assistive device(s) (used cane when ambulating long distance stores) Able to Take Stairs?: No Driving: Yes Vocation: Retired Musician: No difficulties Dominant Hand: Right    Cognition  Cognition Overall Cognitive Status: Appears within functional limits for tasks assessed/performed Arousal/Alertness: Awake/alert Orientation Level: Appears intact for tasks assessed Behavior During Session: [facility] for tasks performed    Extremity/Trunk Assessment Right Lower Extremity Assessment RLE ROM/Strength/Tone: [facility] for tasks assessed Left Lower Extremity Assessment LLE ROM/Strength/Tone: WFL for tasks assessed   Balance Static Standing Balance Static Standing - Balance Support: Bilateral upper extremity supported;During functional activity Static Standing - Level of Assistance: 5: Stand by assistance Static Standing - Comment/# of Minutes: 2 minutes standing at EOB.    End of Session PT - End of Session Equipment Utilized During Treatment: Gait belt;Oxygen (on oxygen in room but ambulated on RA) Activity Tolerance: Patient tolerated treatment well Patient left: in bed;with call bell/phone within reach Nurse Communication: Mobility status       Todd,Kendra Corcoran 12/09/2012, 11:35 AM  [facility] [phone] (pager)

## 2012-12-09 NOTE — Progress Notes (Signed)
[facility]  Subjective: No complaints. Feels better.  Objective: Vital signs in last 24 hours: Temp:  [97.6 F (36.4 C)-98.3 F (36.8 C)] 98.3 F (36.8 C) (02/03 2039) Pulse Rate:  [59-117] 85  (02/03 2128) Resp:  [15-26] 18  (02/03 2128) BP: (74-123)/(60-92) 123/74 mmHg (02/03 2039) SpO2:  [98 %-100 %] 99 % (02/03 2128) Last BM Date: 12/08/12  Intake/Output from previous day: 02/03 0701 - 02/04 0700 In: 488 [P.O.:460; I.V.:20; IV Piggyback:8] Out: 1550 [Urine:1550] Intake/Output this shift:    Medications Current Facility-Administered Medications  Medication Dose Route Frequency Provider Last Rate Last Dose  . acetaminophen (TYLENOL) tablet 1,000 mg  1,000 mg Oral Q6H Ardelle Balls, PA   1,000 mg at 12/09/12 0547  . amiodarone (PACERONE) tablet 400 mg  400 mg Oral BID Kerin Perna, MD   400 mg at 12/08/12 2108  . aspirin EC tablet 325 mg  325 mg Oral Daily Ardelle Balls, [place]   325 mg at 12/08/12 0951  . atorvastatin (LIPITOR) tablet 40 mg  40 mg Oral q1800 Wilburt Finlay, PA   40 mg at 12/08/12 1758  . bisacodyl (DULCOLAX) EC tablet 10 mg  10 mg Oral Daily Ardelle Balls, PA   10 mg at 12/07/12 9528   Or  . bisacodyl (DULCOLAX) suppository 10 mg  10 mg Rectal Daily Ardelle Balls, PA      . budesonide-formoterol (SYMBICORT) 160-4.5 MCG/ACT inhaler 2 puff  2 puff Inhalation BID Kerin Perna, MD   2 puff at 12/08/12 2128  . digoxin (LANOXIN) tablet 0.0625 mg  0.0625 mg Oral Daily Kerin Perna, MD      . docusate sodium (COLACE) capsule 200 mg  200 mg Oral Daily Ardelle Balls, PA   200 mg at 12/07/12 0958  . feeding supplement (ENSURE) pudding 1 Container  1 Container Oral TID BM Alleen Borne, MD   1 Container at 12/08/12 1344  . furosemide (LASIX) tablet 80 mg  80 mg Oral Daily Kerin Perna, MD      . insulin aspart (novoLOG) injection 0-24 Units  0-24 Units Subcutaneous TID AC & HS Kerin Perna, MD       . insulin detemir (LEVEMIR) injection 12 Units  12 Units Subcutaneous Q1200 Kerin Perna, MD   12 Units at 12/08/12 1206  . levalbuterol (XOPENEX) nebulizer solution 1.25 mg  1.25 mg Nebulization Q6H PRN Kerin Perna, MD   1.25 mg at 12/07/12 0357  . metoprolol (LOPRESSOR) injection 2.5-5 mg  2.5-5 mg Intravenous Q2H PRN Ardelle Balls, PA   2.5 mg at 12/07/12 0422  . metoprolol tartrate (LOPRESSOR) tablet 12.5 mg  12.5 mg Oral BID Kerin Perna, MD   12.5 mg at 12/08/12 2108  . ondansetron (ZOFRAN) injection 4 mg  4 mg Intravenous Q6H PRN Ardelle Balls, PA   4 mg at 12/08/12 4132  . pantoprazole (PROTONIX) EC tablet 40 mg  40 mg Oral Daily Ardelle Balls, PA   40 mg at 12/08/12 0951  . sodium chloride 0.9 % injection 3 mL  3 mL Intravenous PRN Ardelle Balls, PA   3 mL at 12/08/12 2109  . sodium chloride 0.9 % injection 3 mL  3 mL Intravenous Q12H Kerin Perna, MD      . sodium chloride 0.9 % injection 3 mL  3 mL Intravenous PRN Kerin Perna, MD      .  traMADol (ULTRAM) tablet 50 mg  50 mg Oral Q6H PRN Kerin Perna, MD   50 mg at 12/08/12 0840  . warfarin (COUMADIN) tablet 2.5 mg  2.5 mg Oral q1800 Kerin Perna, MD   2.5 mg at 12/08/12 1758  . Warfarin - Physician Dosing Inpatient   Does not apply q1800 Kerin Perna, MD        PE: General appearance: alert, cooperative and no distress Lungs: clear to auscultation bilaterally Heart: regular rate and rhythm Extremities: no LEE Pulses: 2+ and symmetric Skin: warm and dry Neurologic: Grossly normal  Lab Results:   Basename 12/09/12 0535 12/08/12 0428 12/07/12 0350  WBC 4.6 8.7 14.9*  HGB 9.5* 10.1* 10.5*  HCT 29.7* 31.5* 32.5*  PLT 222 200 178   BMET  Basename 12/09/12 0535 12/08/12 0428 12/07/12 0350  NA 135 133* 133*  K 5.1 4.7 5.1  CL 97 99 100  CO2 27 26 25   GLUCOSE 91 126* 125*  BUN 40* 33* 25*  CREATININE 1.28* 1.11* 1.17*  CALCIUM 9.2 8.7 8.9   PT/INR  Basename  12/09/12 0535 12/08/12 0428  LABPROT 32.2* 16.7*  INR 3.37* 1.39    Assessment/Plan  Principal Problem:  *NSTEMI (non-ST elevated myocardial infarction) Active Problems:  PAD (peripheral artery disease)  CAD (coronary artery disease): 1993 PTCA to circumflex.  2002 2 BMS stents to ostial, S/P CABG x 3 12/04/12  DM2 (diabetes mellitus, type 2)  HTN (hypertension)  HLD (hyperlipidemia)  TR (tricuspid regurgitation), severe 12/02/12 for repair with CABG  Pulmonary HTN   Plan: S/P CABG x 3. POD#5. She is CP free. Pt has had post-operative A-fib and was placed on Amiodarone. Maintaining NSR on telemetry w/ no A-fib overnight.   HR and BP stable. She is also on Lopressor + warfarin for A/C. She is supratherapeutic today w/ an INR of 3.37. H/H is also slightly down from yesterday but is stable at 9.5/29.7. Will monitor. If H/H continues to downtrend may need to order stool guaiac. She remains on 80 mg of Lasix daily and has been diuresing.  Cr is slighly elevated from yesterday at 1.28 (1.11 yesterday). Will continue to monitor. MD to follow with further recommendation.     LOS: 12 days    Brittainy M. Delmer Islam 12/09/2012 7:44 AM  I have seen and examined the patient along with Brittainy M. Sharol Harness, PA-C.  I have reviewed the chart, notes and new data.  I agree with PA's note.  Key new complaints: feels much better, spirits lifted, walked halls twice. Key examination changes: no AF, sternotomy healing very quickly Key new findings / data: INR excessively high, suspect amiodarone-warfarin interaction "kicking in".  PLAN: Expect may be ready for DC in 1-2 days, barring unexpected bleeding problems.  Thurmon Fair, MD, [facility] [phone] 12/09/2012, 3:39 PM

## 2012-12-10 ENCOUNTER — Inpatient Hospital Stay (HOSPITAL_COMMUNITY): Payer: Medicare Other

## 2012-12-10 LAB — CBC
HCT: 29.4 % — ABNORMAL LOW (ref 36.0–46.0)
Hemoglobin: 9.4 g/dL — ABNORMAL LOW (ref 12.0–15.0)
MCH: 26.1 pg (ref 26.0–34.0)
MCHC: 32 g/dL (ref 30.0–36.0)
MCV: 81.7 fL (ref 78.0–100.0)
Platelets: 264 10*3/uL (ref 150–400)
RBC: 3.6 MIL/uL — ABNORMAL LOW (ref 3.87–5.11)
RDW: 16.7 % — ABNORMAL HIGH (ref 11.5–15.5)
WBC: 5.8 10*3/uL (ref 4.0–10.5)

## 2012-12-10 LAB — BASIC METABOLIC PANEL
BUN: 41 mg/dL — ABNORMAL HIGH (ref 6–23)
CO2: 29 mEq/L (ref 19–32)
Calcium: 9.2 mg/dL (ref 8.4–10.5)
Chloride: 97 mEq/L (ref 96–112)
Creatinine, Ser: 1.15 mg/dL — ABNORMAL HIGH (ref 0.50–1.10)
GFR calc Af Amer: 53 mL/min — ABNORMAL LOW (ref >90)
GFR calc non Af Amer: 46 mL/min — ABNORMAL LOW (ref >90)
Glucose, Bld: 98 mg/dL (ref 70–99)
Potassium: 4.5 mEq/L (ref 3.5–5.1)
Sodium: 135 mEq/L (ref 135–145)

## 2012-12-10 LAB — PROTIME-INR
INR: 3.3 — ABNORMAL HIGH (ref 0.00–1.49)
Prothrombin Time: 31.7 seconds — ABNORMAL HIGH (ref 11.6–15.2)

## 2012-12-10 LAB — GLUCOSE, CAPILLARY: Glucose-Capillary: 103 mg/dL — ABNORMAL HIGH (ref 70–99)

## 2012-12-10 MED ORDER — BUDESONIDE-FORMOTEROL FUMARATE 160-4.5 MCG/ACT IN AERO: 2.0000 | INHALATION_SPRAY | Freq: Two times a day (BID) | RESPIRATORY_TRACT | Status: DC

## 2012-12-10 MED ORDER — ASPIRIN 325 MG PO TBEC: 325.0000 mg | DELAYED_RELEASE_TABLET | Freq: Every day | ORAL | Status: DC

## 2012-12-10 MED ORDER — METFORMIN HCL 500 MG PO TABS
500.0000 mg | ORAL_TABLET | Freq: Two times a day (BID) | ORAL | Status: DC
  Administered 2012-12-10 – 2012-12-11 (×2): 500 mg via ORAL
  Filled 2012-12-10 (×5): qty 1

## 2012-12-10 MED ORDER — FUROSEMIDE 40 MG PO TABS: 40.0000 mg | ORAL_TABLET | Freq: Every day | ORAL | Status: DC

## 2012-12-10 MED ORDER — METOPROLOL TARTRATE 12.5 MG HALF TABLET: 12.5000 mg | ORAL_TABLET | Freq: Two times a day (BID) | ORAL | Status: DC

## 2012-12-10 MED ORDER — TRAMADOL HCL 50 MG PO TABS: 50.0000 mg | ORAL_TABLET | Freq: Four times a day (QID) | ORAL | Status: DC | PRN

## 2012-12-10 MED ORDER — AMIODARONE HCL 400 MG PO TABS: ORAL_TABLET | ORAL | Status: DC

## 2012-12-10 NOTE — Progress Notes (Signed)
Arrived to do PIV restart.   Pt stated,  "I don't want to be stuck any more"   "I'm a very hard stick and I'm tired of being stuck"   "My doctor said I would probably go home tomorrow"   "Can you please not stick on me?"   I spoke with charge nurse ( primary RN, Raynelle Fanning, was off the unit) about pt's request not to be stuck.   They will let MD know.    I did a brief assessment for a vein and did not see anything to attempt.

## 2012-12-10 NOTE — Progress Notes (Addendum)
[address]            [place] [address]          [phone]     6 Days Post-Op  Procedure(s) (LRB): CORONARY ARTERY BYPASS GRAFTING (CABG) (N/A) INTRAOPERATIVE TRANSESOPHAGEAL ECHOCARDIOGRAM (N/A) Subjective: Feels ok, no specific complaints  Objective  Telemetry sinus rhythm  Temp:  [97 F (36.1 C)-98.2 F (36.8 C)] 97.4 F (36.3 C) (02/05 0500) Pulse Rate:  [72-87] 72  (02/05 0500) Resp:  [16-18] 17  (02/05 0500) BP: (129-144)/(56-74) 129/74 mmHg (02/05 0500) SpO2:  [98 %-100 %] 98 % (02/05 0500) Weight:  [159 lb 2.8 oz (72.2 kg)] 159 lb 2.8 oz (72.2 kg) (02/05 0651)   Intake/Output Summary (Last 24 hours) at 12/10/12 0752 Last data filed at 12/10/12 0453  Gross per 24 hour  Intake   1080 ml  Output   1775 ml  Net   -695 ml       General appearance: alert, cooperative and no distress Heart: regular rate and rhythm Lungs: dim bilat lower fields Abdomen: benign Extremities: minor edema Wound: incisions healing well  Lab Results:  Basename 12/10/12 0518 12/09/12 0535  NA 135 135  K 4.5 5.1  CL 97 97  CO2 29 27  GLUCOSE 98 91  BUN 41* 40*  CREATININE 1.15* 1.28*  CALCIUM 9.2 9.2  MG -- --  PHOS -- --   No results found for this basename: AST:2,ALT:2,ALKPHOS:2,BILITOT:2,PROT:2,ALBUMIN:2 in the last 72 hours No results found for this basename: LIPASE:2,AMYLASE:2 in the last 72 hours  Basename 12/10/12 0518 12/09/12 0535  WBC 5.8 4.6  NEUTROABS -- --  HGB 9.4* 9.5*  HCT 29.4* 29.7*  MCV 81.7 82.0  PLT 264 222   No results found for this basename: CKTOTAL:4,CKMB:4,TROPONINI:4 in the last 72 hours No components found with this basename: POCBNP:3 No results found for this basename: DDIMER in the last 72 hours No results found for this basename: HGBA1C in the last 72 hours No results found for this basename: CHOL,HDL,LDLCALC,TRIG,CHOLHDL in the last 72 hours No results found for this basename:  TSH,T4TOTAL,FREET3,T3FREE,THYROIDAB in the last 72 hours No results found for this basename: VITAMINB12,FOLATE,FERRITIN,TIBC,IRON,RETICCTPCT in the last 72 hours  Medications: Scheduled    . amiodarone  400 mg Oral BID  . aspirin EC  325 mg Oral Daily  . atorvastatin  40 mg Oral q1800  . bisacodyl  10 mg Oral Daily   Or  . bisacodyl  10 mg Rectal Daily  . budesonide-formoterol  2 puff Inhalation BID  . digoxin  0.0625 mg Oral Daily  . docusate sodium  200 mg Oral Daily  . feeding supplement  1 Container Oral TID BM  . furosemide  80 mg Oral Daily  . insulin aspart  0-24 Units Subcutaneous TID AC & HS  . insulin detemir  12 Units Subcutaneous Q1200  . metoprolol tartrate  12.5 mg Oral BID  . pantoprazole  40 mg Oral Daily  . sodium chloride  3 mL Intravenous Q12H  . Warfarin - Physician Dosing Inpatient   Does not apply q1800     Radiology/Studies:  Dg Chest 2 View  12/09/2012  *RADIOLOGY REPORT*  Clinical Data: Status post CABG surgery.  Short of breath.  CHEST - 2 VIEW  Comparison: 12/08/2012  Findings: Right mid and bilateral lower lung zone opacity is similar to the prior day's study.  On the lateral view, there is consolidation in the right middle lobe, with additional bilateral lower lobe opacity and small effusions.  The lung opacity may all be atelectasis.  Infiltrate is possible.  No pulmonary edema.  No pneumothorax.  No mediastinal widening.  IMPRESSION: No significant change from the previous day's study.  Bilateral lower lung zone lung opacity, right greater than left, is consistent with bilateral atelectasis or possibly infiltrates with small associated pleural effusions.  No pulmonary edema or pneumothorax.   Original Report Authenticated By: Amie Portland, M.D.     INR:3.30 Will add last result for INR, ABG once components are confirmed Will add last 4 CBG results once components are confirmed  Assessment/Plan: S/P Procedure(s) (LRB): CORONARY ARTERY BYPASS GRAFTING  (CABG) (N/A) INTRAOPERATIVE TRANSESOPHAGEAL ECHOCARDIOGRAM (N/A)  1. CBG' s controlled 2 mostly sinus, did have some sinus arrythmia, on dig /amio/beta blocker- ? Poss d/c dig 3 Bilat effus. On 80 lasix daily, BUN in 40's so may be a bit intravasc dry- check BNP 4 no coumadin today, will need wires out when down 5 poss home 1-2 days  LOS: 13 days    Kendra Todd,Kendra Todd 2/5/20147:52 AM No coumadin today  stop dig now Hold ACE-I until bun < 25 Coumadin for recurrent A-fib Needs HHN @ DC for INR draws, BP checks

## 2012-12-10 NOTE — Progress Notes (Signed)
[facility]  Subjective: No complaints. Feeling well. Ready to go home.  Objective: Vital signs in last 24 hours: Temp:  [97 F (36.1 C)-98.2 F (36.8 C)] 97.4 F (36.3 C) (02/05 0500) Pulse Rate:  [72-87] 72  (02/05 0500) Resp:  [16-18] 17  (02/05 0500) BP: (129-144)/(56-74) 129/74 mmHg (02/05 0500) SpO2:  [98 %-100 %] 98 % (02/05 0500) Weight:  [159 lb 2.8 oz (72.2 kg)] 159 lb 2.8 oz (72.2 kg) (02/05 0651) Last BM Date: 12/08/12  Intake/Output from previous day: 02/04 0701 - 02/05 0700 In: 1080 [P.O.:1080] Out: 1775 [Urine:1775] Intake/Output this shift:    Medications Current Facility-Administered Medications  Medication Dose Route Frequency Provider Last Rate Last Dose  . amiodarone (PACERONE) tablet 400 mg  400 mg Oral BID Kerin Perna, MD   400 mg at 12/09/12 2147  . aspirin EC tablet 325 mg  325 mg Oral Daily Ardelle Balls, PA   325 mg at 12/09/12 1029  . atorvastatin (LIPITOR) tablet 40 mg  40 mg Oral q1800 Wilburt Finlay, PA   40 mg at 12/09/12 1710  . bisacodyl (DULCOLAX) EC tablet 10 mg  10 mg Oral Daily Ardelle Balls, PA   10 mg at 12/07/12 5621   Or  . bisacodyl (DULCOLAX) suppository 10 mg  10 mg Rectal Daily Ardelle Balls, PA      . budesonide-formoterol (SYMBICORT) 160-4.5 MCG/ACT inhaler 2 puff  2 puff Inhalation BID Kerin Perna, MD   2 puff at 12/09/12 2014  . digoxin (LANOXIN) tablet 0.0625 mg  0.0625 mg Oral Daily Kerin Perna, MD   0.0625 mg at 12/09/12 1030  . docusate sodium (COLACE) capsule 200 mg  200 mg Oral Daily Ardelle Balls, PA   200 mg at 12/09/12 1030  . feeding supplement (ENSURE) pudding 1 Container  1 Container Oral TID BM Alleen Borne, MD   1 Container at 12/09/12 1000  . furosemide (LASIX) tablet 80 mg  80 mg Oral Daily Kerin Perna, MD   80 mg at 12/09/12 1030  . insulin aspart (novoLOG) injection 0-24 Units  0-24 Units Subcutaneous TID AC & HS Kerin Perna, MD       . insulin detemir (LEVEMIR) injection 12 Units  12 Units Subcutaneous Q1200 Kerin Perna, MD   12 Units at 12/09/12 1149  . levalbuterol (XOPENEX) nebulizer solution 1.25 mg  1.25 mg Nebulization Q6H PRN Kerin Perna, MD   1.25 mg at 12/07/12 0357  . metoprolol (LOPRESSOR) injection 2.5-5 mg  2.5-5 mg Intravenous Q2H PRN Ardelle Balls, PA   2.5 mg at 12/07/12 0422  . metoprolol tartrate (LOPRESSOR) tablet 12.5 mg  12.5 mg Oral BID Kerin Perna, MD   12.5 mg at 12/09/12 2147  . ondansetron (ZOFRAN) injection 4 mg  4 mg Intravenous Q6H PRN Ardelle Balls, PA   4 mg at 12/08/12 3086  . pantoprazole (PROTONIX) EC tablet 40 mg  40 mg Oral Daily Ardelle Balls, PA   40 mg at 12/09/12 1030  . sodium chloride 0.9 % injection 3 mL  3 mL Intravenous PRN Ardelle Balls, PA   3 mL at 12/08/12 2109  . sodium chloride 0.9 % injection 3 mL  3 mL Intravenous Q12H Kerin Perna, MD   3 mL at 12/09/12 2148  . sodium chloride 0.9 % injection 3 mL  3 mL Intravenous PRN Kerin Perna, MD      .  traMADol (ULTRAM) tablet 50 mg  50 mg Oral Q6H PRN Kerin Perna, MD   50 mg at 12/10/12 0129  . Warfarin - Physician Dosing Inpatient   Does not apply q1800 Kerin Perna, MD        PE: General appearance: alert, cooperative and no distress Lungs: clear to auscultation bilaterally Heart: regular rate and rhythm Extremities: trace bilateral LEE Pulses: 2+ radials, 1+ DPs Skin: warm and dry  Neurologic: Grossly normal  Lab Results:   Basename 12/10/12 0518 12/09/12 0535 12/08/12 0428  WBC 5.8 4.6 8.7  HGB 9.4* 9.5* 10.1*  HCT 29.4* 29.7* 31.5*  PLT 264 222 200   BMET  Basename 12/10/12 0518 12/09/12 0535 12/08/12 0428  NA 135 135 133*  K 4.5 5.1 4.7  CL 97 97 99  CO2 29 27 26   GLUCOSE 98 91 126*  BUN 41* 40* 33*  CREATININE 1.15* 1.28* 1.11*  CALCIUM 9.2 9.2 8.7   PT/INR  Basename 12/10/12 0518 12/09/12 0535 12/08/12 0428  LABPROT 31.7* 32.2* 16.7*  INR 3.30*  3.37* 1.39   Cholesterol No results found for this basename: CHOL in the last 72 hours Cardiac Enzymes No components found with this basename: TROPONIN:3, CKMB:3  Studies/Results: CXR 12/10/12 *RADIOLOGY REPORT*  Clinical Data: Shortness of breath, sore chest, post CABG  CHEST - 2 VIEW  Comparison: 12/09/2012  Findings:  Enlargement of cardiac silhouette post CABG.  Tortuous aorta with atherosclerotic calcification.  Pulmonary vascularity normal.  Bibasilar effusions atelectasis.  More pronounced atelectasis versus consolidation in left lower  lobe.  Upper lungs clear.  No pneumothorax.  Bones demineralized.  IMPRESSION:  No significant change in bibasilar atelectasis and effusions with  potential consolidation in left lower lobe.  Original Report Authenticated By: Ulyses Southward, M.D   Assessment/Plan  Principal Problem:  *NSTEMI (non-ST elevated myocardial infarction) Active Problems:  PAD (peripheral artery disease)  CAD (coronary artery disease): 1993 PTCA to circumflex.  2002 2 BMS stents to ostial, S/P CABG x 3 12/04/12  DM2 (diabetes mellitus, type 2)  HTN (hypertension)  HLD (hyperlipidemia)  TR (tricuspid regurgitation), severe 12/02/12 for repair with CABG  Pulmonary HTN  Plan: S/P CABG x 3. POD#6. CP free. Pt did some A-fib on telemetry overnight, but rate was controlled in the mid 60's. She is currently in NSR with rates in the 70s-80s.  She is on Amiodarone, Lopressor and digoxin + warfarin for AC.  Her INR remains supratherapeutic at 3.30. H/H is still down, but stable at 9.4/29.4 (9.5/29.7 yesterday). ? Possible Amiodarone/warfarin interaction. Will continue to monitor. HR and BP both stable. Cr. is improved from yesterday at 1.15.  Pt should be nearing  D/C. CT surgery to determine status. Pt will need follow-up arranged with primary cardiologist, Dr. Royann Shivers. In addition to BB, she is on ASA and a statin. Consider adding/restarting ACE-I as an OP, once renal  function improves. MD to follow with further recommendation.      LOS: 13 days    Brittainy M. Sharol Harness, PA-C 12/10/2012 8:24 AM

## 2012-12-10 NOTE — Progress Notes (Signed)
CARDIAC REHAB PHASE I   PRE:  Rate/Rhythm: 83SR  BP:  Supine:   Sitting: 122/74  Standing:    SaO2: 100% 2.5L  MODE:  Ambulation: 250 ft   POST:  Rate/Rhythem: 104ST  BP:  Supine:   Sitting: 132/76  Standing:    SaO2: 99% 3L 0920-1024 Pt ambulated slow, but well with walker and assistance x1. Increased distance from previous day 190 to 250 ft. Pt c/o mild fatigue by end of walk. Educated on IS, sternal precautions, exercise, diet (both heart healthy and diabetic), and CRPII. Pt gave permission to contact about CRPII. Returned pt to upright bed with call light and phone within reach.   Deetta Perla

## 2012-12-10 NOTE — Progress Notes (Signed)
Patient evaluated for community based chronic disease management services with [facility] Care Management Program as a benefit of patient's [employer].  Spoke with patient at bedside to explain [facility] Care Management services. Patient will have her niece, Onalee Hua, as her primary caregiver at home.  She feels confident that they will be able to manage her care at home independently.  Left contact information and [facility] literature at bedside.  Patient understands that she can contact our office for assistance with care post discharge.  Of note, [facility] Care Management services does not replace or interfere with any services that are arranged by inpatient case management or social work.  For additional questions or referrals please contact Anibal Henderson BSN RN [facility] Liaison at [phone].

## 2012-12-10 NOTE — Progress Notes (Signed)
Notified IVT this am that pt needed an IV site.  She was doing blood draws from night shift and was unable at that time.  The only site I saw that I may obtain an IV site was the antecubitals and pt does not want IV there.   Another nurse also assessed and is unable to stick pt.   Have paged IV nurse minimum of 3 times and have had no response.  I explained to pt the dangers of not having IV access.  She stated,  "I'll be fine, I'm just gonna lay here and take a nap."

## 2012-12-10 NOTE — Progress Notes (Signed)
Occupational Therapy Treatment Patient Details Name: Kendra Todd MRN: [id number] DOB: Apr 03, 1941 Today's Date: 12/10/2012 Time: 1151-1200 OT Time Calculation (min): 9 min  OT Assessment / Plan / Recommendation Comments on Treatment Session Pt painful this session. Just received pain meds. Pt independent with all sternal precautions. Will have adequate support from niece to safetyD/C home. Pt needs 3 in 1 for home D/C. No further OT indecated at this time.    Follow Up Recommendations  No OT follow up    Barriers to Discharge       Equipment Recommendations  3 in 1 bedside comode    Recommendations for Other Services    Frequency Min 2X/week   Plan Discharge plan remains appropriate    Precautions / Restrictions Precautions Precautions: Sternal   Pertinent Vitals/Pain Pain. 8. Chest. Pain meds given just before session. O2 sats 98.    ADL  ADL Comments: Pt painful this am. Reviewed sternal precautions for mobilityand ADL. Pt demonstrated and verbalized understanding.    OT Diagnosis:    OT Problem List:   OT Treatment Interventions:     OT Goals Acute Rehab OT Goals OT Goal Formulation: With patient Time For Goal Achievement: 12/23/12 Potential to Achieve Goals: Good ADL Goals Additional ADL Goal #1: Pt will demonstrate ability to independently instruct caregiver in sternal precautions for bed mobility and ADL. ADL Goal: Additional Goal #1 - Progress: Met Additional ADL Goal #2: Pt will verbalize 2 E conservation techniques for ADL ADL Goal: Additional Goal #2 - Progress: Met  Visit Information  Last OT Received On: 12/10/12 Assistance Needed: +1    Subjective Data      Prior Functioning    independent   Cognition  Cognition Overall Cognitive Status: Appears within functional limits for tasks assessed/performed Arousal/Alertness: Awake/alert Orientation Level: Appears intact for tasks assessed Behavior During Session: [facility] for tasks performed    Mobility  Bed Mobility Bed Mobility: Rolling Left;Left Sidelying to Sit;Sitting - Scoot to Edge of Bed Rolling Left: 5: Supervision Left Sidelying to Sit: 5: Supervision Supine to Sit: 5: Supervision    Exercises      Balance  WFL   End of Session OT - End of Session Activity Tolerance: Patient limited by pain Patient left: in bed;with call bell/phone within reach Nurse Communication: Mobility status  GO     Aliou Mealey,HILLARY 12/10/2012, 12:03 PM [facility], OTR/L  [phone] 12/10/2012

## 2012-12-10 NOTE — Discharge Summary (Signed)
[address]            [place] [address]          [phone]         Discharge Summary  Name: Kendra Todd DOB: 1941-07-31 72 y.o. MRN: [id number]   Admission Date: 11/27/2012 Discharge Date:     Admitting Diagnosis: Chest pain   Discharge Diagnosis:  Non-ST elevation myocardial infarction Expected postoperative blood loss anemia Postoperative atrial fibrillation Bilateral pleural effusions  Past Medical History  Diagnosis Date  . Arthritis   . Atrial fibrillation 02/21/2011  . Hypertension   . Hyperlipidemia   . Myocardial infarction   . Diabetes mellitus age 44  . CAD (coronary artery disease)     prior coronary stenting  . Stroke 2001  . Peripheral vascular disease   . Carotid artery disease 2001    s/p Bilateral CEA, after CVA  . TR (tricuspid regurgitation), severe 12/02/12 for repair with CABG 12/03/2012  . Pulmonary HTN 12/03/2012      Procedures: CORONARY ARTERY BYPASS GRAFTING x 3 (Left internal mammary arter to left anterior descending, saphenous vein graft to circumflex, saphenous vein graft to right coronary) ENDOSCOPIC VEIN HARVEST RIGHT LEG - 12/04/2012    HPI:  The patient is a 72 y.o. female with a known history of coronary artery disease, status post 2 prior myocardial infarctions, with PTCA to the left circumflex in 1993 and bare metal stents to the ostial right in 2002.  She presented to the Emergency Department on the date of admission complaining of chest pain which began at rest, radiating to her neck and shoulder, and was associated with nausea and dizziness. An EKG showed no acute changes.  She was seen by cardiology, started on a beta blocker, as well as heparin and nitroglycerin, and was admitted for further workup.    Hospital Course:  The patient was admitted to [facility] on 11/27/2012. Her pain resolved on nitroglycerin. Troponin was found to be elevated at 1.91.  She underwent cardiac  catheterization by Dr. Herbie Baltimore, which showed EF 60-65%, with 50% left main and severe three vessel disease.  She was felt to be a poor candidate for further percutaneous interventions, and a cardiac surgery consult was requested.  Dr. Donata Clay saw the patient and reveiwed her films, and agreed with the need for surgical revascularization. A preoperative echocardiogram showed significant pulmonary hypertension with elevated PA pressures and significant tricuspid regurgitation, therefore a right heart cath was performed on 12/02/2012.  This confirmed elevated right heart pressures with moderate pulmonary hypertension.  It was felt that she would require CABG and tricuspid repair.  All risks, benefits and alternatives of surgery were explained in detail, and the patient agreed to proceed.  The patient was taken to the operating room and underwent the above procedure.  Intraoperative TEE showed only mild to moderate tricuspid regurgitation, therefore no intervention was performed to the valve.  The postoperative course was initially notable for atrial fibrillation.  She was started on Amiodarone and Lopressor, but became bradycardic, requiring epicardial pacing. She did convert to sinus rhythm, and her heart rate improved.  The pacer was discontinued, however, she had a recurrence of atrial fibrillation, and Coumadin and Digoxin were initiated.  The digoxin was ultimately discontinued due to some bradycardia and sinus arrhythmia.  She overall has remained stable.  She is currently in sinus rhythm,  and blood pressures have been stable.  She had some postoperative elevation in her BUN and creatinine, therefore, an ACE-inhibitor has been held.  She was started on lasix for volume overload and pleural effusions and is diuresing well.  Her blood sugars have been stable on low dose insulin, and we have restarted her oral medications. She is ambulating with cardiac rehab.  We anticipate discharge home once her INR and  rhythm are stable.     Recent vital signs:  Filed Vitals:   12/10/12 0820  BP:   Pulse:   Temp:   Resp: 16    Recent laboratory studies:  CBC: Basename 12/10/12 0518 12/09/12 0535  WBC 5.8 4.6  HGB 9.4* 9.5*  HCT 29.4* 29.7*  PLT 264 222   BMET:  Basename 12/10/12 0518 12/09/12 0535  NA 135 135  K 4.5 5.1  CL 97 97  CO2 29 27  GLUCOSE 98 91  BUN 41* 40*  CREATININE 1.15* 1.28*  CALCIUM 9.2 9.2    PT/INR:  Basename 12/10/12 0518  LABPROT 31.7*  INR 3.30*      Medication List     As of 12/11/2012 12:29 PM    STOP taking these medications         metoprolol succinate 50 MG 24 hr tablet   Commonly known as: TOPROL-XL      valsartan 160 MG tablet   Commonly known as: DIOVAN      TAKE these medications         amiodarone 400 MG tablet   Commonly known as: PACERONE   Take 400 mg po bid x 1 week, then decrease to 400 mg po daily      aspirin 325 MG EC tablet   Take 1 tablet (325 mg total) by mouth daily.      budesonide-formoterol 160-4.5 MCG/ACT inhaler   Commonly known as: SYMBICORT   Inhale 2 puffs into the lungs 2 (two) times daily.      furosemide 40 MG tablet   Commonly known as: LASIX   Take 1 tablet (40 mg total) by mouth daily. X 1 week      metFORMIN 1000 MG (MOD) 24 hr tablet   Commonly known as: GLUMETZA   Take 1,000 mg by mouth 2 (two) times daily with a meal.      metoprolol tartrate 12.5 mg Tabs   Commonly known as: LOPRESSOR   Take 0.5 tablets (12.5 mg total) by mouth 2 (two) times daily.      NITROGLYCERIN PO   Take 0.4 mg by mouth as needed. For chest pain.      rosuvastatin 20 MG tablet   Commonly known as: CRESTOR   Take 20 mg by mouth at bedtime.      traMADol 50 MG tablet   Commonly known as: ULTRAM   Take 1 tablet (50 mg total) by mouth every 6 (six) hours as needed for pain.      warfarin 2 MG tablet   Commonly known as: COUMADIN   Take 1 tablet (2 mg total) by mouth daily. And as directed by coumadin clinic            Discharge Instructions:  The patient is to refrain from driving, heavy lifting or strenuous activity.  May shower daily and clean incisions with soap and water.  May resume regular diet.   Follow Up:      Discharge Orders    Future Appointments: Provider: Department: Dept Phone: Center:  12/31/2012 1:30 PM Kerin Perna, MD [facility] [phone] [facility]   03/12/2013 3:00 PM [facility] [phone] [facility]   03/12/2013 4:00 PM Evern Bio, NP [facility] [phone] [facility]      Follow-up Information    Follow up with VAN Dinah Beers, MD. On 12/31/2012. (Have a chest x-ray at 12:30, then see MD at 1:30)    Contact information:   [address] [employer] [address] [place] [address] [phone]       Follow up with Thurmon Fair, MD. Schedule an appointment as soon as possible for a visit in 2 weeks.   Contact information:   [address] [place] [address] [phone]        Home health nurse to check PT/INR with results to [facility]   COLLINS,GINA H 12/10/2012, 9:29 AM

## 2012-12-10 NOTE — Progress Notes (Signed)
Pt. Seen and examined. Agree with the NP/PA-C note as written.  Plan d/c digoxin. Continue amiodarone and adjust warfarin accordingly. Close to d/c. Follow-up with Dr. Royann Shivers.  Chrystie Nose, MD, [facility] Attending Cardiologist [facility]

## 2012-12-11 ENCOUNTER — Inpatient Hospital Stay (HOSPITAL_COMMUNITY): Payer: Medicare Other

## 2012-12-11 LAB — BASIC METABOLIC PANEL
BUN: 36 mg/dL — ABNORMAL HIGH (ref 6–23)
CO2: 30 mEq/L (ref 19–32)
Calcium: 9.5 mg/dL (ref 8.4–10.5)
Chloride: 94 mEq/L — ABNORMAL LOW (ref 96–112)
Creatinine, Ser: 1.12 mg/dL — ABNORMAL HIGH (ref 0.50–1.10)
GFR calc Af Amer: 55 mL/min — ABNORMAL LOW (ref >90)
GFR calc non Af Amer: 48 mL/min — ABNORMAL LOW (ref >90)
Glucose, Bld: 111 mg/dL — ABNORMAL HIGH (ref 70–99)
Potassium: 4.6 mEq/L (ref 3.5–5.1)
Sodium: 134 mEq/L — ABNORMAL LOW (ref 135–145)

## 2012-12-11 LAB — GLUCOSE, CAPILLARY
Glucose-Capillary: 111 mg/dL — ABNORMAL HIGH (ref 70–99)
Glucose-Capillary: 113 mg/dL — ABNORMAL HIGH (ref 70–99)

## 2012-12-11 LAB — CBC
HCT: 29.8 % — ABNORMAL LOW (ref 36.0–46.0)
Hemoglobin: 9.6 g/dL — ABNORMAL LOW (ref 12.0–15.0)
MCH: 26.1 pg (ref 26.0–34.0)
MCHC: 32.2 g/dL (ref 30.0–36.0)
MCV: 81 fL (ref 78.0–100.0)
Platelets: 289 10*3/uL (ref 150–400)
RBC: 3.68 MIL/uL — ABNORMAL LOW (ref 3.87–5.11)
RDW: 16.7 % — ABNORMAL HIGH (ref 11.5–15.5)
WBC: 7.3 10*3/uL (ref 4.0–10.5)

## 2012-12-11 LAB — PROTIME-INR
INR: 2.55 — ABNORMAL HIGH (ref 0.00–1.49)
Prothrombin Time: 26.2 seconds — ABNORMAL HIGH (ref 11.6–15.2)

## 2012-12-11 MED ORDER — FUROSEMIDE 40 MG PO TABS: 40.0000 mg | ORAL_TABLET | Freq: Every day | ORAL | Status: DC

## 2012-12-11 MED ORDER — WARFARIN SODIUM 2 MG PO TABS: 2.0000 mg | ORAL_TABLET | Freq: Every day | ORAL | Status: DC

## 2012-12-11 NOTE — Progress Notes (Signed)
Physical Therapy Treatment Patient Details Name: Kendra Todd MRN: [id number] DOB: [birth year] Today's Date: 12/11/2012 Time: 1205-1223 PT Time Calculation (min): 18 min  PT Assessment / Plan / Recommendation Comments on Treatment Session  Ready for D/C. Not fully independent yet, but safe with level of family assist    Follow Up Recommendations  Home health PT     Does the patient have the potential to tolerate intense rehabilitation     Barriers to Discharge        Equipment Recommendations  None recommended by PT    Recommendations for Other Services    Frequency Min 3X/week   Plan Discharge plan remains appropriate;Frequency remains appropriate    Precautions / Restrictions Precautions Precautions: Sternal Restrictions Other Position/Activity Restrictions: sternal   Pertinent Vitals/Pain     Mobility  Bed Mobility Bed Mobility: Not assessed Transfers Transfers: Sit to Stand;Stand to Sit Sit to Stand: 5: Supervision;Without upper extremity assist;From chair/3-in-1 Stand to Sit: 5: Supervision;Without upper extremity assist;To chair/3-in-1 Details for Transfer Assistance: reinforced sternal precautions, but pt remembering well Ambulation/Gait Ambulation/Gait Assistance: 5: Supervision Ambulation Distance (Feet): 450 Feet Assistive device: None;Rolling walker Ambulation/Gait Assistance Details: mild unsteadiness without RW, generally steady with RW Gait Pattern: Step-through pattern;Decreased stride length;Trunk flexed;Decreased step length - right;Decreased step length - left Gait velocity: decreased Stairs: No    Exercises     PT Diagnosis:    PT Problem List:   PT Treatment Interventions:     PT Goals Acute Rehab PT Goals Time For Goal Achievement: 12/16/12 Potential to Achieve Goals: Good Pt will go Supine/Side to Sit: Independently Pt will go Sit to Stand: Independently PT Goal: Sit to Stand - Progress: Progressing toward goal Pt will Transfer Bed to  Chair/Chair to Bed: Independently PT Transfer Goal: Bed to Chair/Chair to Bed - Progress: Progressing toward goal Pt will Ambulate: 51 - 150 feet;with modified independence;with least restrictive assistive device PT Goal: Ambulate - Progress: Progressing toward goal Additional Goals Additional Goal #1: Pt to follow sternal precautions with all mobility without cuing. PT Goal: Additional Goal #1 - Progress: Partly met  Visit Information  Last PT Received On: 12/11/12 Assistance Needed: +1    Subjective Data  Subjective: I'm leaving today!   Cognition  Cognition Overall Cognitive Status: Appears within functional limits for tasks assessed/performed Arousal/Alertness: Awake/alert Orientation Level: Oriented X4 / Intact Behavior During Session: [facility] for tasks performed    Balance  Balance Balance Assessed: No  End of Session PT - End of Session Activity Tolerance: Patient tolerated treatment well Patient left: in chair;with call bell/phone within reach Nurse Communication: Mobility status   GP     Sukaina Toothaker, Eliseo Gum 12/11/2012, 1:40 PM 12/11/2012  Epes Bing, PT [phone] (pager)

## 2012-12-11 NOTE — Progress Notes (Signed)
Removed EPW per MD order and chest tube sutures. Pt's INR 2.5 and MD aware, still wants EPW removed. Pt tolerated well, tips were intact on EPW. Some bleeding from left side of abdomen where EPW pulled but held pressure and bleeding stopped. Placed steri strips and benzoin over chest tube suture site. Pt is on bed rest for one hour with vital signs set up.

## 2012-12-11 NOTE — Progress Notes (Signed)
CARDIAC REHAB PHASE I   PRE:  Rate/Rhythm: 77 SR  BP:  Supine: 116/60  Sitting:   Standing:    SaO2: 92 RA  MODE:  Ambulation: 550 ft   POST:  Rate/Rhythem: 98  BP:  Supine:   Sitting: 120/60  Standing:    SaO2: 95 RA [id number] On arrival pt on room air, sat 92%. Assisted X 1 and used walker to ambulate. Gait steady with walker, slow pace. Pt able to walk 550 feet with 2-3 standing rest stops. Room air sat during walk 93- 95%, on return to room sat 95%. Pt to recliner after walk with call light in reach. Pt denies any questions related to education provided yesterday. Put recovery from [facility] video for pt to watch.  Beatrix Fetters

## 2012-12-11 NOTE — Progress Notes (Signed)
[facility]  Subjective: No complaints. Feels good and ready to go home.  Objective: Vital signs in last 24 hours: Temp:  [98 F (36.7 C)-98.1 F (36.7 C)] 98 F (36.7 C) (02/06 0448) Pulse Rate:  [69-85] 69  (02/06 0448) Resp:  [16-18] 17  (02/06 0448) BP: (122-146)/(60-75) 122/75 mmHg (02/06 0448) SpO2:  [98 %-100 %] 100 % (02/06 0448) Weight:  [159 lb 2.8 oz (72.2 kg)] 159 lb 2.8 oz (72.2 kg) (02/06 0448) Last BM Date: 12/10/12  Intake/Output from previous day: 02/05 0701 - 02/06 0700 In: 480 [P.O.:480] Out: 400 [Urine:400] Intake/Output this shift:    Medications Current Facility-Administered Medications  Medication Dose Route Frequency Provider Last Rate Last Dose  . amiodarone (PACERONE) tablet 400 mg  400 mg Oral BID Kerin Perna, MD   400 mg at 12/10/12 2158  . aspirin EC tablet 325 mg  325 mg Oral Daily Ardelle Balls, PA   325 mg at 12/10/12 1036  . atorvastatin (LIPITOR) tablet 40 mg  40 mg Oral q1800 Wilburt Finlay, PA   40 mg at 12/10/12 1708  . bisacodyl (DULCOLAX) EC tablet 10 mg  10 mg Oral Daily Ardelle Balls, PA   10 mg at 12/10/12 1037   Or  . bisacodyl (DULCOLAX) suppository 10 mg  10 mg Rectal Daily Ardelle Balls, PA      . budesonide-formoterol (SYMBICORT) 160-4.5 MCG/ACT inhaler 2 puff  2 puff Inhalation BID Kerin Perna, MD   2 puff at 12/10/12 2032  . docusate sodium (COLACE) capsule 200 mg  200 mg Oral Daily Ardelle Balls, PA   200 mg at 12/10/12 1037  . feeding supplement (ENSURE) pudding 1 Container  1 Container Oral TID BM Alleen Borne, MD   1 Container at 12/10/12 1000  . furosemide (LASIX) tablet 80 mg  80 mg Oral Daily Kerin Perna, MD   80 mg at 12/10/12 1037  . insulin aspart (novoLOG) injection 0-24 Units  0-24 Units Subcutaneous TID AC & HS Kerin Perna, MD   2 Units at 12/10/12 1257  . levalbuterol (XOPENEX) nebulizer solution 1.25 mg  1.25 mg Nebulization Q6H PRN Kerin Perna, MD   1.25 mg at 12/07/12 0357  . metFORMIN (GLUCOPHAGE) tablet 500 mg  500 mg Oral BID WC Wilmon Pali, PA   500 mg at 12/11/12 0753  . metoprolol (LOPRESSOR) injection 2.5-5 mg  2.5-5 mg Intravenous Q2H PRN Ardelle Balls, PA   2.5 mg at 12/07/12 0422  . metoprolol tartrate (LOPRESSOR) tablet 12.5 mg  12.5 mg Oral BID Kerin Perna, MD   12.5 mg at 12/10/12 2158  . ondansetron (ZOFRAN) injection 4 mg  4 mg Intravenous Q6H PRN Ardelle Balls, PA   4 mg at 12/08/12 8119  . pantoprazole (PROTONIX) EC tablet 40 mg  40 mg Oral Daily Ardelle Balls, PA   40 mg at 12/10/12 1037  . sodium chloride 0.9 % injection 3 mL  3 mL Intravenous PRN Ardelle Balls, PA   3 mL at 12/08/12 2109  . sodium chloride 0.9 % injection 3 mL  3 mL Intravenous Q12H Kerin Perna, MD   3 mL at 12/09/12 2148  . sodium chloride 0.9 % injection 3 mL  3 mL Intravenous PRN Kerin Perna, MD      . traMADol Janean Sark) tablet 50 mg  50 mg Oral Q6H PRN Kerin Perna, MD  50 mg at 12/10/12 1144  . Warfarin - Physician Dosing Inpatient   Does not apply q1800 Kerin Perna, MD        PE: General appearance: alert, cooperative and no distress Lungs: clear to auscultation bilaterally Heart: regular rate and rhythm Extremities: no LEE Pulses: 2+ radials, 1+ DPs Skin: warm and dry Neurologic: Grossly normal  Lab Results:   Basename 12/11/12 0540 12/10/12 0518 12/09/12 0535  WBC 7.3 5.8 4.6  HGB 9.6* 9.4* 9.5*  HCT 29.8* 29.4* 29.7*  PLT 289 264 222   BMET  Basename 12/11/12 0540 12/10/12 0518 12/09/12 0535  NA 134* 135 135  K 4.6 4.5 5.1  CL 94* 97 97  CO2 30 29 27   GLUCOSE 111* 98 91  BUN 36* 41* 40*  CREATININE 1.12* 1.15* 1.28*  CALCIUM 9.5 9.2 9.2   PT/INR  Basename 12/11/12 0540 12/10/12 0518 12/09/12 0535  LABPROT 26.2* 31.7* 32.2*  INR 2.55* 3.30* 3.37*    Assessment/Plan  Principal Problem:  *NSTEMI (non-ST elevated myocardial infarction) Active  Problems:  PAD (peripheral artery disease)  CAD (coronary artery disease): 1993 PTCA to circumflex.  2002 2 BMS stents to ostial, S/P CABG x 3 12/04/12  DM2 (diabetes mellitus, type 2)  HTN (hypertension)  HLD (hyperlipidemia)  TR (tricuspid regurgitation), severe 12/02/12 for repair with CABG  Pulmonary HTN  Plan: S/P CABG x 3. POD#7. CP free. No further A-fib on telemetry. Maintaining NSR. She remains on Amiodarone and Lopressor + warfarin for AC.  INR is therapeutic at 2.55. Pt should be nearing D/C soon. CT surgery to determine status. Will arrange follow-up at [facility] once discharged. Pt will be enrolled in OP cardiac rehab. In addition to BB, she is on ASA and a statin. ? The need to add/restart ACE-I as an OP. MD to follow with further recommendation.   LOS: 14 days    Brittainy M. Delmer Islam 12/11/2012 8:26 AM   Patient seen and examined. Agree with assessment and plan. Day 7 s/p CABG. INR therapeutic. With CAD, DM would resume ACE-I or ARB long term. Cr 1.12; probably ok to initiate low dose and titrate as outpatient. Would add losartan initially at 25 mg. I will follow up as outpatient in several weeks following initial PA f/u 1 week post dc. Arrange for phase 2 cardiac rehab as outpatient.   Lennette Bihari, MD, [facility] 12/11/2012 9:08 AM

## 2012-12-11 NOTE — Progress Notes (Addendum)
[address]            [place] [address]          [phone]     7 Days Post-Op  Procedure(s) (LRB): CORONARY ARTERY BYPASS GRAFTING (CABG) (N/A) INTRAOPERATIVE TRANSESOPHAGEAL ECHOCARDIOGRAM (N/A) Subjective: Anxious for discharge  Objective  Telemetry sinus, occ PVC's   Temp:  [98 F (36.7 C)-98.1 F (36.7 C)] 98 F (36.7 C) (02/06 0448) Pulse Rate:  [69-85] 69  (02/06 0448) Resp:  [16-18] 17  (02/06 0448) BP: (122-146)/(60-75) 122/75 mmHg (02/06 0448) SpO2:  [96 %-100 %] 100 % (02/06 0448) Weight:  [159 lb 2.8 oz (72.2 kg)] 159 lb 2.8 oz (72.2 kg) (02/06 0448)   Intake/Output Summary (Last 24 hours) at 12/11/12 0737 Last data filed at 12/10/12 1300  Gross per 24 hour  Intake    480 ml  Output    400 ml  Net     80 ml       General appearance: alert, cooperative and no distress Heart: regular rate and rhythm Lungs: dim in bases Abdomen: soft, nontender Extremities: min edema Wound: incisions healing well  Lab Results:  Basename 12/11/12 0540 12/10/12 0518  NA 134* 135  K 4.6 4.5  CL 94* 97  CO2 30 29  GLUCOSE 111* 98  BUN 36* 41*  CREATININE 1.12* 1.15*  CALCIUM 9.5 9.2  MG -- --  PHOS -- --   No results found for this basename: AST:2,ALT:2,ALKPHOS:2,BILITOT:2,PROT:2,ALBUMIN:2 in the last 72 hours No results found for this basename: LIPASE:2,AMYLASE:2 in the last 72 hours  Basename 12/11/12 0540 12/10/12 0518  WBC 7.3 5.8  NEUTROABS -- --  HGB 9.6* 9.4*  HCT 29.8* 29.4*  MCV 81.0 81.7  PLT 289 264   No results found for this basename: CKTOTAL:4,CKMB:4,TROPONINI:4 in the last 72 hours No components found with this basename: POCBNP:3 No results found for this basename: DDIMER in the last 72 hours No results found for this basename: HGBA1C in the last 72 hours No results found for this basename: CHOL,HDL,LDLCALC,TRIG,CHOLHDL in the last 72 hours No results found for this basename:  TSH,T4TOTAL,FREET3,T3FREE,THYROIDAB in the last 72 hours No results found for this basename: VITAMINB12,FOLATE,FERRITIN,TIBC,IRON,RETICCTPCT in the last 72 hours  Medications: Scheduled    . amiodarone  400 mg Oral BID  . aspirin EC  325 mg Oral Daily  . atorvastatin  40 mg Oral q1800  . bisacodyl  10 mg Oral Daily   Or  . bisacodyl  10 mg Rectal Daily  . budesonide-formoterol  2 puff Inhalation BID  . docusate sodium  200 mg Oral Daily  . feeding supplement  1 Container Oral TID BM  . furosemide  80 mg Oral Daily  . insulin aspart  0-24 Units Subcutaneous TID AC & HS  . metFORMIN  500 mg Oral BID WC  . metoprolol tartrate  12.5 mg Oral BID  . pantoprazole  40 mg Oral Daily  . sodium chloride  3 mL Intravenous Q12H  . Warfarin - Physician Dosing Inpatient   Does not apply q1800     Radiology/Studies:  Dg Chest 2 View  12/10/2012  *RADIOLOGY REPORT*  Clinical Data: Shortness of breath, sore chest, post CABG  CHEST - 2 VIEW  Comparison: 12/09/2012  Findings: Enlargement of cardiac silhouette post CABG. Tortuous aorta with atherosclerotic calcification. Pulmonary vascularity normal. Bibasilar effusions atelectasis. More pronounced atelectasis  versus consolidation in left lower lobe. Upper lungs clear. No pneumothorax. Bones demineralized.  IMPRESSION: No significant change in bibasilar atelectasis and effusions with potential consolidation in left lower lobe.   Original Report Authenticated By: Ulyses Southward, M.D.     INR:2.55 Will add last result for INR, ABG once components are confirmed Will add last 4 CBG results once components are confirmed  Assessment/Plan: S/P Procedure(s) (LRB): CORONARY ARTERY BYPASS GRAFTING (CABG) (N/A) INTRAOPERATIVE TRANSESOPHAGEAL ECHOCARDIOGRAM (N/A)  1. Doing well, will see if ok to d/c epw's with current INR- will need to determine home coumadin dose 2 BNP 2809.0 yesterday, currently on 80 lasix/day, BNP still rising, creat 1.12- cont to hold ace 3  rhythm stable 4 cbg's good control   LOS: 14 days    GOLD,WAYNE E 2/6/20147:37 AM   remove EPW's tomorrow BUN coming down, cont lasix 80mg  po daily CXR improved effusions

## 2012-12-11 NOTE — Progress Notes (Signed)
Discharge instructions discussed with pt. Along with new prescriptions. Pt verbalized her understanding of both. Pt is stable for discharge with her daughter at 5pm per MD order.

## 2012-12-12 DIAGNOSIS — E1159 Type 2 diabetes mellitus with other circulatory complications: Secondary | ICD-10-CM | POA: Diagnosis not present

## 2012-12-12 DIAGNOSIS — I4891 Unspecified atrial fibrillation: Secondary | ICD-10-CM | POA: Diagnosis not present

## 2012-12-12 DIAGNOSIS — Z48812 Encounter for surgical aftercare following surgery on the circulatory system: Secondary | ICD-10-CM | POA: Diagnosis not present

## 2012-12-12 DIAGNOSIS — I2789 Other specified pulmonary heart diseases: Secondary | ICD-10-CM | POA: Diagnosis not present

## 2012-12-12 DIAGNOSIS — I2510 Atherosclerotic heart disease of native coronary artery without angina pectoris: Secondary | ICD-10-CM | POA: Diagnosis not present

## 2012-12-12 DIAGNOSIS — I798 Other disorders of arteries, arterioles and capillaries in diseases classified elsewhere: Secondary | ICD-10-CM | POA: Diagnosis not present

## 2012-12-13 DIAGNOSIS — I4891 Unspecified atrial fibrillation: Secondary | ICD-10-CM | POA: Diagnosis not present

## 2012-12-13 DIAGNOSIS — I2510 Atherosclerotic heart disease of native coronary artery without angina pectoris: Secondary | ICD-10-CM | POA: Diagnosis not present

## 2012-12-13 DIAGNOSIS — Z48812 Encounter for surgical aftercare following surgery on the circulatory system: Secondary | ICD-10-CM | POA: Diagnosis not present

## 2012-12-13 DIAGNOSIS — I2789 Other specified pulmonary heart diseases: Secondary | ICD-10-CM | POA: Diagnosis not present

## 2012-12-13 DIAGNOSIS — I798 Other disorders of arteries, arterioles and capillaries in diseases classified elsewhere: Secondary | ICD-10-CM | POA: Diagnosis not present

## 2012-12-13 DIAGNOSIS — E1159 Type 2 diabetes mellitus with other circulatory complications: Secondary | ICD-10-CM | POA: Diagnosis not present

## 2012-12-17 ENCOUNTER — Telehealth: Payer: Self-pay

## 2012-12-17 DIAGNOSIS — I2510 Atherosclerotic heart disease of native coronary artery without angina pectoris: Secondary | ICD-10-CM | POA: Diagnosis not present

## 2012-12-17 DIAGNOSIS — Z48812 Encounter for surgical aftercare following surgery on the circulatory system: Secondary | ICD-10-CM | POA: Diagnosis not present

## 2012-12-17 DIAGNOSIS — I798 Other disorders of arteries, arterioles and capillaries in diseases classified elsewhere: Secondary | ICD-10-CM | POA: Diagnosis not present

## 2012-12-17 DIAGNOSIS — E1159 Type 2 diabetes mellitus with other circulatory complications: Secondary | ICD-10-CM | POA: Diagnosis not present

## 2012-12-17 DIAGNOSIS — I4891 Unspecified atrial fibrillation: Secondary | ICD-10-CM | POA: Diagnosis not present

## 2012-12-17 DIAGNOSIS — R112 Nausea with vomiting, unspecified: Secondary | ICD-10-CM

## 2012-12-17 DIAGNOSIS — I2789 Other specified pulmonary heart diseases: Secondary | ICD-10-CM | POA: Diagnosis not present

## 2012-12-17 MED ORDER — PROMETHAZINE HCL 25 MG PO TABS: 25.0000 mg | ORAL_TABLET | Freq: Four times a day (QID) | ORAL | Status: DC | PRN

## 2012-12-17 NOTE — Telephone Encounter (Signed)
RX for Promethazine 25 mg 1 po every 6 hrs prn called to pt's pharm. She denies any fevers or diarrhea. She has been eating "Brat diet" foods and is able to keep them down. She will call back if sx's do not improve in 24 hours.

## 2012-12-18 ENCOUNTER — Encounter: Payer: Self-pay | Admitting: Pharmacist Clinician (PhC)/ Clinical Pharmacy Specialist

## 2012-12-18 NOTE — Discharge Summary (Signed)
patient examined and medical record reviewed,agree with above note. VAN TRIGT III,PETER 12/18/2012    

## 2012-12-19 ENCOUNTER — Encounter: Payer: Self-pay | Admitting: Pharmacist Clinician (PhC)/ Clinical Pharmacy Specialist

## 2012-12-19 DIAGNOSIS — I2510 Atherosclerotic heart disease of native coronary artery without angina pectoris: Secondary | ICD-10-CM | POA: Diagnosis not present

## 2012-12-19 DIAGNOSIS — I798 Other disorders of arteries, arterioles and capillaries in diseases classified elsewhere: Secondary | ICD-10-CM | POA: Diagnosis not present

## 2012-12-19 DIAGNOSIS — E1159 Type 2 diabetes mellitus with other circulatory complications: Secondary | ICD-10-CM | POA: Diagnosis not present

## 2012-12-19 DIAGNOSIS — Z48812 Encounter for surgical aftercare following surgery on the circulatory system: Secondary | ICD-10-CM | POA: Diagnosis not present

## 2012-12-19 DIAGNOSIS — I2789 Other specified pulmonary heart diseases: Secondary | ICD-10-CM | POA: Diagnosis not present

## 2012-12-19 DIAGNOSIS — I4891 Unspecified atrial fibrillation: Secondary | ICD-10-CM | POA: Diagnosis not present

## 2012-12-22 DIAGNOSIS — Z48812 Encounter for surgical aftercare following surgery on the circulatory system: Secondary | ICD-10-CM | POA: Diagnosis not present

## 2012-12-22 DIAGNOSIS — I4891 Unspecified atrial fibrillation: Secondary | ICD-10-CM | POA: Diagnosis not present

## 2012-12-22 DIAGNOSIS — I2789 Other specified pulmonary heart diseases: Secondary | ICD-10-CM | POA: Diagnosis not present

## 2012-12-22 DIAGNOSIS — I798 Other disorders of arteries, arterioles and capillaries in diseases classified elsewhere: Secondary | ICD-10-CM | POA: Diagnosis not present

## 2012-12-22 DIAGNOSIS — E1159 Type 2 diabetes mellitus with other circulatory complications: Secondary | ICD-10-CM | POA: Diagnosis not present

## 2012-12-22 DIAGNOSIS — I2510 Atherosclerotic heart disease of native coronary artery without angina pectoris: Secondary | ICD-10-CM | POA: Diagnosis not present

## 2012-12-23 DIAGNOSIS — I2789 Other specified pulmonary heart diseases: Secondary | ICD-10-CM | POA: Diagnosis not present

## 2012-12-23 DIAGNOSIS — E1159 Type 2 diabetes mellitus with other circulatory complications: Secondary | ICD-10-CM | POA: Diagnosis not present

## 2012-12-23 DIAGNOSIS — I798 Other disorders of arteries, arterioles and capillaries in diseases classified elsewhere: Secondary | ICD-10-CM | POA: Diagnosis not present

## 2012-12-23 DIAGNOSIS — Z48812 Encounter for surgical aftercare following surgery on the circulatory system: Secondary | ICD-10-CM | POA: Diagnosis not present

## 2012-12-23 DIAGNOSIS — I2510 Atherosclerotic heart disease of native coronary artery without angina pectoris: Secondary | ICD-10-CM | POA: Diagnosis not present

## 2012-12-23 DIAGNOSIS — I4891 Unspecified atrial fibrillation: Secondary | ICD-10-CM | POA: Diagnosis not present

## 2012-12-29 ENCOUNTER — Other Ambulatory Visit: Payer: Self-pay | Admitting: *Deleted

## 2012-12-29 DIAGNOSIS — I2510 Atherosclerotic heart disease of native coronary artery without angina pectoris: Secondary | ICD-10-CM

## 2012-12-31 ENCOUNTER — Ambulatory Visit
Admission: RE | Admit: 2012-12-31 | Discharge: 2012-12-31 | Disposition: A | Discharge status: Home / Self Care | Payer: Medicare Other | Source: Ambulatory Visit | Attending: Cardiothoracic Surgery | Admitting: Cardiothoracic Surgery

## 2012-12-31 ENCOUNTER — Encounter: Payer: Self-pay | Admitting: Cardiothoracic Surgery

## 2012-12-31 ENCOUNTER — Ambulatory Visit (INDEPENDENT_AMBULATORY_CARE_PROVIDER_SITE_OTHER): Payer: Self-pay | Admitting: Cardiothoracic Surgery

## 2012-12-31 VITALS — BP 189/84 | HR 90 | Temp 97.5°F | Resp 20 | Ht 62.0 in | Wt 159.0 lb

## 2012-12-31 DIAGNOSIS — Z48812 Encounter for surgical aftercare following surgery on the circulatory system: Secondary | ICD-10-CM | POA: Diagnosis not present

## 2012-12-31 DIAGNOSIS — J9819 Other pulmonary collapse: Secondary | ICD-10-CM | POA: Diagnosis not present

## 2012-12-31 DIAGNOSIS — I2510 Atherosclerotic heart disease of native coronary artery without angina pectoris: Secondary | ICD-10-CM

## 2012-12-31 DIAGNOSIS — Z951 Presence of aortocoronary bypass graft: Secondary | ICD-10-CM

## 2012-12-31 NOTE — Progress Notes (Signed)
PCP is GREEN, [name], MD Referring Provider is Lennette Bihari, MD  Chief Complaint  Patient presents with  . Routine Post Op    3 week f/u from surgery with CXR, S/P CABG x 3 on 12/04/12  . Nausea    C/O random n/v, diarrhea since hospital discharge, spitting alot    HPI: Patient returns for 3 week followup after undergoing CABG x3 for severe CAD. She had postop atrial fibrillation and was discharged home on amiodarone. She's been very nauseated weak unable eat and fairly miserable probably has a result of the adverse reaction to amiodarone. She was started on Coumadin but this was stopped when her first INR as an outpatient was elevated. She has lost 10 pounds. She denies fever. The surgical incisions are healing well. She's had no angina. She has not yet seen her cardiologist.  Past Medical History  Diagnosis Date  . Arthritis   . Atrial fibrillation 02/21/2011  . Hypertension   . Hyperlipidemia   . Myocardial infarction   . Diabetes mellitus age 6  . CAD (coronary artery disease)     prior coronary stenting  . Stroke 2001  . Peripheral vascular disease   . Carotid artery disease 2001    s/p Bilateral CEA, after CVA  . TR (tricuspid regurgitation), severe 12/02/12 for repair with CABG 12/03/2012  . Pulmonary HTN 12/03/2012  . Dyspnea 03/13/2006    Myoview - EF 76%; mild ischemia in apical lateral region, less severe than previous study in 2002  . H/O endarterectomy 09/11/2012    patent L and R carotid sites, R 60-79% restenosed ICA; L <40% restenosed ICA  . Stenosis of artery 06/03/2012    doppler- most distal aspect of abd aorta no aneurysmal dilation; bilateral ABIs - mild L arterial insufficiency, L CIA narrowing w/ 50-69% diameter reduction; L and R SFA both w/ 0-49% diameter reductions  . H/O endarterectomy 01/08/2012    doppler - R distal common carotid/proximal ICA stenosed 80-99%, L 0-39%  . Decreased pedal pulses 06/04/2011    doppler - bilateral ABIs no evidence of  insufficiency; L CIA slightly elevated velocities 20-30% diameter reduction;   . Limb pain 05/02/2011    doppler - no evidence of thrombus of thrombophlebitis  . Diabetes   . H/O carotid endarterectomy 03/06/2012    CT angio -high grade stenosis of proximal R internal carotid w/ lg posterior ulceration or dissection flap; near occlusive stenosis at origin of nondominantproximal L vertebral artery; <50% stenosis of proximal R vertebral and proximal L subclavian artery    Past Surgical History  Procedure Laterality Date  . Carotid endarterectomy  2001    Bilateral  . Cholecystectomy    . Kidney stone surgery      Removal  . Back surgery  [phone]    three previous surgeries  . Angioplasty  1984 and 1982  . Abdominal hysterectomy  1975  . Percutaneous coronary stent intervention (pci-s)  2002    2 BMS in ostial/Prox & mid RCA (mid 2.5 mm 20x  mm, & 2.75 mm x  8 mm ostial)  . Coronary artery bypass graft  12/04/2012    Procedure: CORONARY ARTERY BYPASS GRAFTING (CABG);  Surgeon: Kerin Perna, MD;  Location: [facility] OR;  Service: Open Heart Surgery;  Laterality: N/A;  . Intraoperative transesophageal echocardiogram  12/04/2012    Procedure: INTRAOPERATIVE TRANSESOPHAGEAL ECHOCARDIOGRAM;  Surgeon: Kerin Perna, MD;  Location: [facility] OR;  Service: Open Heart Surgery;  Laterality: N/A;  .  Appendectomy  1954  . Tee with cardioversion  01/23/2011    EF 50-55%; grade 2 diastolic dysfunction, elevated mean LA filling pressure, RV systolic pressure increased consistent w/ mod pulmonary hypertension  . Arch aortogram  03/30/2011    40% recurrent stenosis at origin of R proximal carotid patch, shelf like recurrent stenosis w/in midsection of patch that does not appear to be flow limiting; normal non-stenosed great vessel origins    Family History  Problem Relation Age of Onset  . Other Mother     CVA  . Diabetes Mother   . Heart disease Mother     Heart disease before age 24  . Heart attack  Mother   . Heart disease Father     Heart Disease before age 85  . Heart attack Father   . Heart disease Sister     Amputation  . Diabetes Sister   . Heart disease Brother     Heart disease before age 75  . Diabetes Brother   . Stroke Brother   . Stroke Brother   . Heart disease Brother   . Stroke Brother   . Heart disease Brother   . Heart disease Sister   . Heart disease Sister   . Diabetes Other   . Cancer Other     breast  . Heart disease Other     cad  . Other Other     cva  . Diabetes Daughter   . Cancer Sister     Social History History  Substance Use Topics  . Smoking status: Current Every Day Smoker -- 0.30 packs/day for 57 years    Types: Cigarettes  . Smokeless tobacco: Never Used     Comment: pt states she is working on it but husband just passed  . Alcohol Use: No    Current Outpatient Prescriptions  Medication Sig Dispense Refill  . amiodarone (PACERONE) 400 MG tablet Take 200 mg by mouth 2 (two) times daily. Take 400 mg po bid x 1 week, then decrease to 400 mg po daily      . aspirin EC 325 MG EC tablet Take 1 tablet (325 mg total) by mouth daily.  30 tablet    . budesonide-formoterol (SYMBICORT) 160-4.5 MCG/ACT inhaler Inhale 2 puffs into the lungs 2 (two) times daily.  1 Inhaler  0  . metFORMIN (GLUMETZA) 1000 MG (MOD) 24 hr tablet Take 1,000 mg by mouth 2 (two) times daily with a meal.       . metoprolol tartrate (LOPRESSOR) 12.5 mg TABS Take 0.5 tablets (12.5 mg total) by mouth 2 (two) times daily.  30 tablet  1  . NITROGLYCERIN PO Take 0.4 mg by mouth as needed. For chest pain.      . rosuvastatin (CRESTOR) 20 MG tablet Take 20 mg by mouth at bedtime.       . traMADol (ULTRAM) 50 MG tablet Take 1 tablet (50 mg total) by mouth every 6 (six) hours as needed for pain.  30 tablet  0  . warfarin (COUMADIN) 2 MG tablet Take 1 tablet (2 mg total) by mouth daily. And as directed by coumadin clinic  100 tablet  1   No current facility-administered  medications for this visit.    Allergies  Allergen Reactions  . Iron Hives  . Promethazine Other (See Comments)    hallucinations  . Vicodin (Hydrocodone-Acetaminophen)   . Vioxx (Rofecoxib)     Review of Systems her main complaint is nausea and weakness  BP 189/84  Pulse 90  Temp(Src) 97.5 F (36.4 C) (Oral)  Resp 20  Ht 5\' 2"  (1.575 m)  Wt 159 lb (72.122 kg)  BMI 29.07 kg/m2  SpO2 97% Physical Exam Alert does not feel well Lungs clear Sternal and leg incision well-healed Lungs without edema Heart rhythm regular without murmur EKG rhythm strip shows sinus   Diagnostic Tests: Chest x-ray clear, improved pleural effusions which he initially had postoperatively in the hospital  Impression: Adverse reaction to amiodarone will stop the drug. She should not need Coumadin anymore. Plan: Her blood pressure is poorly controlled then she will resume Diovan which she was taking before surgery and will increase her Lopressor to 25 twice a day and dad HCTZ 25 mg daily. We will make an appointment with her cardiologist Dr. Aurelio Jew and see the patient back in approximately 4 weeks to assess her progress.

## 2013-01-06 DIAGNOSIS — I4891 Unspecified atrial fibrillation: Secondary | ICD-10-CM | POA: Diagnosis not present

## 2013-01-08 DIAGNOSIS — I798 Other disorders of arteries, arterioles and capillaries in diseases classified elsewhere: Secondary | ICD-10-CM | POA: Diagnosis not present

## 2013-01-08 DIAGNOSIS — I2789 Other specified pulmonary heart diseases: Secondary | ICD-10-CM | POA: Diagnosis not present

## 2013-01-08 DIAGNOSIS — I4891 Unspecified atrial fibrillation: Secondary | ICD-10-CM | POA: Diagnosis not present

## 2013-01-12 DIAGNOSIS — R5381 Other malaise: Secondary | ICD-10-CM | POA: Diagnosis not present

## 2013-02-02 DIAGNOSIS — I4891 Unspecified atrial fibrillation: Secondary | ICD-10-CM | POA: Diagnosis not present

## 2013-02-02 DIAGNOSIS — I2510 Atherosclerotic heart disease of native coronary artery without angina pectoris: Secondary | ICD-10-CM | POA: Diagnosis not present

## 2013-02-02 DIAGNOSIS — I10 Essential (primary) hypertension: Secondary | ICD-10-CM | POA: Diagnosis not present

## 2013-02-02 DIAGNOSIS — I739 Peripheral vascular disease, unspecified: Secondary | ICD-10-CM | POA: Diagnosis not present

## 2013-02-04 DIAGNOSIS — I2510 Atherosclerotic heart disease of native coronary artery without angina pectoris: Secondary | ICD-10-CM | POA: Diagnosis not present

## 2013-02-04 DIAGNOSIS — Z48812 Encounter for surgical aftercare following surgery on the circulatory system: Secondary | ICD-10-CM | POA: Diagnosis not present

## 2013-02-04 DIAGNOSIS — I2789 Other specified pulmonary heart diseases: Secondary | ICD-10-CM | POA: Diagnosis not present

## 2013-02-04 DIAGNOSIS — I4891 Unspecified atrial fibrillation: Secondary | ICD-10-CM | POA: Diagnosis not present

## 2013-02-04 DIAGNOSIS — E1159 Type 2 diabetes mellitus with other circulatory complications: Secondary | ICD-10-CM | POA: Diagnosis not present

## 2013-02-04 DIAGNOSIS — I798 Other disorders of arteries, arterioles and capillaries in diseases classified elsewhere: Secondary | ICD-10-CM | POA: Diagnosis not present

## 2013-02-11 ENCOUNTER — Telehealth (HOSPITAL_COMMUNITY): Payer: Self-pay | Admitting: Cardiac Rehabilitation

## 2013-02-11 NOTE — Telephone Encounter (Signed)
pc to enroll patient in cardiac rehab. Pt declined.  She is not interested in program. Dr. Rubie Maid made aware

## 2013-02-16 ENCOUNTER — Other Ambulatory Visit: Payer: Self-pay | Admitting: *Deleted

## 2013-02-16 DIAGNOSIS — I2510 Atherosclerotic heart disease of native coronary artery without angina pectoris: Secondary | ICD-10-CM

## 2013-02-18 ENCOUNTER — Encounter (HOSPITAL_COMMUNITY): Payer: Self-pay | Admitting: *Deleted

## 2013-02-18 ENCOUNTER — Ambulatory Visit
Admission: RE | Admit: 2013-02-18 | Discharge: 2013-02-18 | Disposition: A | Discharge status: Home / Self Care | Payer: Medicare Other | Source: Ambulatory Visit | Attending: Cardiothoracic Surgery | Admitting: Cardiothoracic Surgery

## 2013-02-18 ENCOUNTER — Ambulatory Visit (INDEPENDENT_AMBULATORY_CARE_PROVIDER_SITE_OTHER): Payer: Self-pay | Admitting: Cardiothoracic Surgery

## 2013-02-18 VITALS — BP 134/74 | HR 65 | Resp 16 | Ht 62.0 in | Wt 137.0 lb

## 2013-02-18 DIAGNOSIS — I2510 Atherosclerotic heart disease of native coronary artery without angina pectoris: Secondary | ICD-10-CM

## 2013-02-18 DIAGNOSIS — R918 Other nonspecific abnormal finding of lung field: Secondary | ICD-10-CM | POA: Diagnosis not present

## 2013-02-18 DIAGNOSIS — Z951 Presence of aortocoronary bypass graft: Secondary | ICD-10-CM

## 2013-02-18 NOTE — Progress Notes (Signed)
PCP is GREEN, Kendra Curt, MD Referring Provider is Lennette Bihari, MD  Chief Complaint  Patient presents with  . Routine Post Op    6 wk f/u with cxr s/p CABG1/30/14    HPI: 2 month followup after multivessel bypass grafting for unstable angina. The patient's TR at time of surgery was not significant and TR repair not performed  Patient doing well without recurrent angina or symptoms of CHF. She is not smoking. She is maintained sinus rhythm is stop taking her amiodarone and Coumadin. She is comfortable doing the activities of daily living.   Past Medical History  Diagnosis Date  . Arthritis   . Atrial fibrillation 02/21/2011  . Hypertension   . Hyperlipidemia   . Myocardial infarction   . Diabetes mellitus age 89  . CAD (coronary artery disease)     prior coronary stenting  . Stroke 2001  . Peripheral vascular disease   . Carotid artery disease 2001    s/p Bilateral CEA, after CVA  . TR (tricuspid regurgitation), severe 12/02/12 for repair with CABG 12/03/2012  . Pulmonary HTN 12/03/2012  . Dyspnea 03/13/2006    Myoview - EF 76%; mild ischemia in apical lateral region, less severe than previous study in 2002  . H/O endarterectomy 09/11/2012    patent L and R carotid sites, R 60-79% restenosed ICA; L <40% restenosed ICA  . Stenosis of artery 06/03/2012    doppler- most distal aspect of abd aorta no aneurysmal dilation; bilateral ABIs - mild L arterial insufficiency, L CIA narrowing w/ 50-69% diameter reduction; L and R SFA both w/ 0-49% diameter reductions  . H/O endarterectomy 01/08/2012    doppler - R distal common carotid/proximal ICA stenosed 80-99%, L 0-39%  . Decreased pedal pulses 06/04/2011    doppler - bilateral ABIs no evidence of insufficiency; L CIA slightly elevated velocities 20-30% diameter reduction;   . Limb pain 05/02/2011    doppler - no evidence of thrombus of thrombophlebitis  . Diabetes   . H/O carotid endarterectomy 03/06/2012    CT angio -high grade stenosis of  proximal R internal carotid w/ lg posterior ulceration or dissection flap; near occlusive stenosis at origin of nondominantproximal L vertebral artery; <50% stenosis of proximal R vertebral and proximal L subclavian artery    Past Surgical History  Procedure Laterality Date  . Carotid endarterectomy  2001    Bilateral  . Cholecystectomy    . Kidney stone surgery      Removal  . Back surgery  [phone]    three previous surgeries  . Angioplasty  1984 and 1982  . Abdominal hysterectomy  1975  . Percutaneous coronary stent intervention (pci-s)  2002    2 BMS in ostial/Prox & mid RCA (mid 2.5 mm 20x  mm, & 2.75 mm x  8 mm ostial)  . Coronary artery bypass graft  12/04/2012    Procedure: CORONARY ARTERY BYPASS GRAFTING (CABG);  Surgeon: Kerin Perna, MD;  Location: [facility] OR;  Service: Open Heart Surgery;  Laterality: N/A;  . Intraoperative transesophageal echocardiogram  12/04/2012    Procedure: INTRAOPERATIVE TRANSESOPHAGEAL ECHOCARDIOGRAM;  Surgeon: Kerin Perna, MD;  Location: [facility] OR;  Service: Open Heart Surgery;  Laterality: N/A;  . Appendectomy  1954  . Tee with cardioversion  01/23/2011    EF 50-55%; grade 2 diastolic dysfunction, elevated mean LA filling pressure, RV systolic pressure increased consistent w/ mod pulmonary hypertension  . Arch aortogram  03/30/2011    40% recurrent stenosis at  origin of R proximal carotid patch, shelf like recurrent stenosis w/in midsection of patch that does not appear to be flow limiting; normal non-stenosed great vessel origins    Family History  Problem Relation Age of Onset  . Other Mother     CVA  . Diabetes Mother   . Heart disease Mother     Heart disease before age 79  . Heart attack Mother   . Heart disease Father     Heart Disease before age 40  . Heart attack Father   . Heart disease Sister     Amputation  . Diabetes Sister   . Heart disease Brother     Heart disease before age 18  . Diabetes Brother   . Stroke Brother    . Stroke Brother   . Heart disease Brother   . Stroke Brother   . Heart disease Brother   . Heart disease Sister   . Heart disease Sister   . Diabetes Other   . Cancer Other     breast  . Heart disease Other     cad  . Other Other     cva  . Diabetes Daughter   . Cancer Sister     Social History History  Substance Use Topics  . Smoking status: Not on file  . Smokeless tobacco: Never Used     Comment: pt states she is working on it but husband just passed  . Alcohol Use: No    Current Outpatient Prescriptions  Medication Sig Dispense Refill  . aspirin EC 325 MG EC tablet Take 1 tablet (325 mg total) by mouth daily.  30 tablet    . metFORMIN (GLUMETZA) 1000 MG (MOD) 24 hr tablet Take 1,000 mg by mouth 2 (two) times daily with a meal.       . metoprolol tartrate (LOPRESSOR) 12.5 mg TABS Take 0.5 tablets (12.5 mg total) by mouth 2 (two) times daily.  30 tablet  1  . NITROGLYCERIN PO Take 0.4 mg by mouth as needed. For chest pain.      . traMADol (ULTRAM) 50 MG tablet Take 1 tablet (50 mg total) by mouth every 6 (six) hours as needed for pain.  30 tablet  0  . amiodarone (PACERONE) 400 MG tablet Take 200 mg by mouth 2 (two) times daily. Take 400 mg po bid x 1 week, then decrease to 400 mg po daily      . warfarin (COUMADIN) 2 MG tablet Take 1 tablet (2 mg total) by mouth daily. And as directed by coumadin clinic  100 tablet  1   No current facility-administered medications for this visit.    Allergies  Allergen Reactions  . Iron Hives  . Promethazine Other (See Comments)    hallucinations  . Vicodin (Hydrocodone-Acetaminophen)   . Vioxx (Rofecoxib)     Review of Systems no fever or weight loss improved appetite and strength  BP 134/74  Pulse 65  Resp 16  Ht 5\' 2"  (1.575 m)  Wt 137 lb (62.143 kg)  BMI 25.05 kg/m2  SpO2 94% Physical Exam Lungs clear Sternal incision well-healed Leg incision without erythema Cardiac rhythm regular without murmur  Diagnostic  Tests: Chest x-ray clear  Impression: Doing well 2 months after surgery  she understands she can lift up to 15-20 pounds but no more dose 3 months after surgery. Continue current meds and return here as needed  Plan: Return when necessary

## 2013-03-02 ENCOUNTER — Other Ambulatory Visit: Payer: Medicare Other

## 2013-03-02 ENCOUNTER — Other Ambulatory Visit: Payer: Self-pay | Admitting: *Deleted

## 2013-03-02 DIAGNOSIS — I10 Essential (primary) hypertension: Secondary | ICD-10-CM | POA: Diagnosis not present

## 2013-03-02 DIAGNOSIS — IMO0001 Reserved for inherently not codable concepts without codable children: Secondary | ICD-10-CM | POA: Diagnosis not present

## 2013-03-03 LAB — COMPREHENSIVE METABOLIC PANEL
ALT: 12 IU/L (ref 0–32)
Alkaline Phosphatase: 72 IU/L (ref 39–117)
BUN/Creatinine Ratio: 15 (ref 11–26)
Chloride: 103 mmol/L (ref 97–108)
GFR calc Af Amer: 50 mL/min/{1.73_m2} — ABNORMAL LOW (ref >59)
Potassium: 5.3 mmol/L — ABNORMAL HIGH (ref 3.5–5.2)
Total Bilirubin: 0.4 mg/dL (ref 0.0–1.2)

## 2013-03-03 LAB — HEMOGLOBIN A1C
Est. average glucose Bld gHb Est-mCnc: 131 mg/dL
Hgb A1c MFr Bld: 6.2 % — ABNORMAL HIGH (ref 4.8–5.6)

## 2013-03-03 LAB — LIPID PANEL
Cholesterol, Total: 266 mg/dL — ABNORMAL HIGH (ref 100–199)
LDL Calculated: 198 mg/dL — ABNORMAL HIGH (ref 0–99)

## 2013-03-04 ENCOUNTER — Encounter: Payer: Self-pay | Admitting: Internal Medicine

## 2013-03-04 ENCOUNTER — Encounter: Payer: Self-pay | Admitting: Geriatric Medicine

## 2013-03-04 ENCOUNTER — Ambulatory Visit (INDEPENDENT_AMBULATORY_CARE_PROVIDER_SITE_OTHER): Payer: Medicare Other | Admitting: Internal Medicine

## 2013-03-04 VITALS — BP 130/82 | HR 87 | Temp 98.7°F | Resp 18 | Ht 63.0 in | Wt 143.0 lb

## 2013-03-04 DIAGNOSIS — E785 Hyperlipidemia, unspecified: Secondary | ICD-10-CM | POA: Diagnosis not present

## 2013-03-04 DIAGNOSIS — I739 Peripheral vascular disease, unspecified: Secondary | ICD-10-CM

## 2013-03-04 DIAGNOSIS — E119 Type 2 diabetes mellitus without complications: Secondary | ICD-10-CM | POA: Diagnosis not present

## 2013-03-04 DIAGNOSIS — I10 Essential (primary) hypertension: Secondary | ICD-10-CM

## 2013-03-04 DIAGNOSIS — I071 Rheumatic tricuspid insufficiency: Secondary | ICD-10-CM

## 2013-03-04 DIAGNOSIS — IMO0001 Reserved for inherently not codable concepts without codable children: Secondary | ICD-10-CM

## 2013-03-04 DIAGNOSIS — I079 Rheumatic tricuspid valve disease, unspecified: Secondary | ICD-10-CM

## 2013-03-04 DIAGNOSIS — I2510 Atherosclerotic heart disease of native coronary artery without angina pectoris: Secondary | ICD-10-CM

## 2013-03-04 MED ORDER — ROSUVASTATIN CALCIUM 40 MG PO TABS: ORAL_TABLET | ORAL | Status: DC

## 2013-03-04 NOTE — Patient Instructions (Signed)
-   Resume Crestor 

## 2013-03-04 NOTE — Progress Notes (Signed)
Subjective:    Patient ID: Kendra Todd, female    DOB: 1941/03/31, 72 y.o.   MRN: [id number]  HPI Had MI and subsequent CABG in Feb 2014. Went into AF after surgery then converted spontaneously. She was taken off Crestor due to a problem of nausea in the hospital.  Review of Systems  Constitutional: Negative.   HENT: Negative.   Eyes: Negative.   Respiratory: Negative.   Cardiovascular: Negative for chest pain, palpitations and leg swelling.  Endocrine: Negative for polydipsia, polyphagia and polyuria.       History diabetes  Genitourinary: Positive for frequency. Flank pain: In andon.  Musculoskeletal: Positive for arthralgias.  Skin: Negative.   Neurological: Negative.   Hematological: Negative.   Psychiatric/Behavioral: Negative.        Objective:BP 130/82  Pulse 87  Temp(Src) 98.7 F (37.1 C) (Oral)  Resp 18  Ht 5\' 3"  (1.6 m)  Wt 143 lb (64.864 kg)  BMI 25.34 kg/m2  SpO2 99%    Physical Exam  Constitutional: She is oriented to person, place, and time. She appears well-developed and well-nourished. No distress.  HENT:  Head: Normocephalic.  Nose: Nose normal.  Upper and lower dentures present.  Eyes: Conjunctivae are normal. Pupils are equal, round, and reactive to light.  Wears prescription lenses. Right cataract.  Neck: Normal range of motion. Neck supple. No JVD present. No tracheal deviation present. No thyromegaly present.  Cardiovascular: Normal rate, regular rhythm and normal heart sounds.  Exam reveals no gallop and no friction rub.   No murmur heard. Diminished dorsalis pedis pulses bilaterally and diminished posterior tibial pulses bilaterally.  Pulmonary/Chest: Effort normal and breath sounds normal. No respiratory distress. She has no wheezes. She has no rales. She exhibits no tenderness.  Abdominal: Soft. Bowel sounds are normal. She exhibits no distension and no mass. There is no tenderness.  Musculoskeletal: She exhibits edema. She exhibits no  tenderness.  Trace pedal edema  Lymphadenopathy:    She has no cervical adenopathy.  Neurological: She is alert and oriented to person, place, and time. She has normal reflexes. No cranial nerve deficit. Coordination normal.  Skin: Skin is warm and dry. No rash noted. No erythema. No pallor.  Psychiatric: She has a normal mood and affect. Her behavior is normal. Judgment normal.      CMP     Component Value Date/Time   NA 140 03/02/2013 1149   NA 134* 12/11/2012 0540   K 5.3* 03/02/2013 1149   CL 103 03/02/2013 1149   CO2 25 03/02/2013 1149   GLUCOSE 110* 03/02/2013 1149   GLUCOSE 111* 12/11/2012 0540   BUN 18 03/02/2013 1149   BUN 36* 12/11/2012 0540   CREATININE 1.23* 03/02/2013 1149   CALCIUM 9.9 03/02/2013 1149   PROT 6.3 03/02/2013 1149   PROT 6.6 12/03/2012 1534   ALBUMIN 3.1* 12/03/2012 1534   AST 22 03/02/2013 1149   ALT 12 03/02/2013 1149   ALKPHOS 72 03/02/2013 1149   BILITOT 0.4 03/02/2013 1149   GFRNONAA 43* 03/02/2013 1149   GFRAA 50* 03/02/2013 1149    CBC    Component Value Date/Time   WBC 7.3 12/11/2012 0540   RBC 3.68* 12/11/2012 0540   HGB 9.6* 12/11/2012 0540   HCT 29.8* 12/11/2012 0540   PLT 289 12/11/2012 0540   MCV 81.0 12/11/2012 0540   MCH 26.1 12/11/2012 0540   MCHC 32.2 12/11/2012 0540   RDW 16.7* 12/11/2012 0540   LYMPHSABS 1.2 01/22/2011 1346   MONOABS  1.1* 01/22/2011 1346   EOSABS 0.0 01/22/2011 1346   BASOSABS 0.0 01/22/2011 1346    glycosylated hemoglobin 6.2     Assessment & Plan:

## 2013-03-06 ENCOUNTER — Other Ambulatory Visit: Payer: Self-pay | Admitting: Internal Medicine

## 2013-03-12 ENCOUNTER — Other Ambulatory Visit: Payer: Medicare Other

## 2013-03-12 ENCOUNTER — Ambulatory Visit: Payer: Medicare Other | Admitting: Neurosurgery

## 2013-03-13 ENCOUNTER — Other Ambulatory Visit: Payer: Medicare Other

## 2013-03-13 ENCOUNTER — Ambulatory Visit: Payer: Medicare Other | Admitting: Neurosurgery

## 2013-03-25 DIAGNOSIS — M899 Disorder of bone, unspecified: Secondary | ICD-10-CM | POA: Diagnosis not present

## 2013-03-25 DIAGNOSIS — M1990 Unspecified osteoarthritis, unspecified site: Secondary | ICD-10-CM | POA: Diagnosis not present

## 2013-04-07 DIAGNOSIS — H524 Presbyopia: Secondary | ICD-10-CM | POA: Diagnosis not present

## 2013-04-07 DIAGNOSIS — H4011X0 Primary open-angle glaucoma, stage unspecified: Secondary | ICD-10-CM | POA: Diagnosis not present

## 2013-04-07 DIAGNOSIS — H2510 Age-related nuclear cataract, unspecified eye: Secondary | ICD-10-CM | POA: Diagnosis not present

## 2013-04-22 ENCOUNTER — Ambulatory Visit (INDEPENDENT_AMBULATORY_CARE_PROVIDER_SITE_OTHER): Payer: Medicare Other | Admitting: Cardiovascular Disease

## 2013-04-22 ENCOUNTER — Encounter: Payer: Self-pay | Admitting: Cardiovascular Disease

## 2013-04-22 VITALS — BP 154/78 | HR 63 | Resp 14 | Ht 63.0 in | Wt 145.7 lb

## 2013-04-22 DIAGNOSIS — I2510 Atherosclerotic heart disease of native coronary artery without angina pectoris: Secondary | ICD-10-CM | POA: Diagnosis not present

## 2013-04-22 DIAGNOSIS — R079 Chest pain, unspecified: Secondary | ICD-10-CM

## 2013-04-22 DIAGNOSIS — E119 Type 2 diabetes mellitus without complications: Secondary | ICD-10-CM

## 2013-04-22 DIAGNOSIS — I10 Essential (primary) hypertension: Secondary | ICD-10-CM

## 2013-04-22 DIAGNOSIS — I739 Peripheral vascular disease, unspecified: Secondary | ICD-10-CM

## 2013-04-22 DIAGNOSIS — E782 Mixed hyperlipidemia: Secondary | ICD-10-CM | POA: Diagnosis not present

## 2013-04-22 DIAGNOSIS — Z79899 Other long term (current) drug therapy: Secondary | ICD-10-CM | POA: Diagnosis not present

## 2013-04-22 DIAGNOSIS — E785 Hyperlipidemia, unspecified: Secondary | ICD-10-CM

## 2013-04-22 DIAGNOSIS — I4891 Unspecified atrial fibrillation: Secondary | ICD-10-CM

## 2013-04-22 NOTE — Patient Instructions (Addendum)
Please keep a record of your blood pressure (once daily, sitting down for at least 10-15 minutes before checking), checked in the left arm. Once you have a week of records please send to our office.  Have labs drawn June 05, 2013 fasting (nothing to eat or drink after midnight the night before).  Your physician recommends that you schedule a follow-up appointment in: 6 months

## 2013-04-25 ENCOUNTER — Encounter: Payer: Self-pay | Admitting: Cardiovascular Disease

## 2013-04-25 DIAGNOSIS — I4891 Unspecified atrial fibrillation: Secondary | ICD-10-CM | POA: Insufficient documentation

## 2013-04-25 NOTE — Assessment & Plan Note (Signed)
Her blood pressure is high. She is to start taking the metoprolol 25 mg twice daily. I do not think this will cause significant bradycardia.

## 2013-04-25 NOTE — Assessment & Plan Note (Addendum)
The most recent lipid profile was performed well she was off statin therapy. Chris. 4 severe GI upset, which was then clearly attributed to amiodarone. She is now back on a statin. It is time to repeat her labs. In the past while on Crestor her LDL cholesterol is just under 100.

## 2013-04-25 NOTE — Assessment & Plan Note (Signed)
Her most recent Doppler study in July of 2013 showed a 50-69% stenosis in the left common iliac artery there was three-vessel runoff in the calf on the right side there was two-vessel runoff to 2 occlusion of the anterior tibial artery with distal reconstitution; bilaterally the ABIs were normal; she does not describe claudication today.

## 2013-04-25 NOTE — Assessment & Plan Note (Signed)
This appears to have been a perioperative problem, has also had it in the past. Recently it has not recurred. She is not on anticoagulation secondary to severe GI bleeding while on Xarelto. She is not taking any antiarrhythmics. She tolerated amiodarone poorly.

## 2013-04-25 NOTE — Progress Notes (Signed)
Patient ID: Kendra Todd, female   DOB: 12/07/1940, 72 y.o.   MRN: [id number]     Reason for office visit CAD, paroxysmal atrial fibrillation, peripheral arterial disease  Mrs. Uhlig is now roughly 6 months status post bypass surgery in finally feels like she is recovering. Her postoperative course was complicated by atrial fibrillation and treatment with a meal that to severe nausea vomiting and diarrhea. Her statin anemia were stopped and she slowly improved. She lost 20 pounds of weight but a lot of this is coming back. She still complains of numbness around the sternotomy.  She is free of angina or exertional dyspnea even when active. She recently had a repeat evaluation of her carotids and has 80% restenosis in the right carotid. Chest had bilateral carotid endarterectomies in one of the procedures was complicated by stroke related to a dissection flap.   She has not had any recurrent palpitations or other signs that might indicate recurrent atrial fibrillation. Of note her atrial fibrillation preceded her bypass surgery, but she is not on anticoagulants do to gastrointestinal bleeding according transfusion while on Xarelto. She is taking full dose aspirin.    Allergies  Allergen Reactions  . Iron Hives  . Norvasc (Amlodipine Besylate)   . Promethazine Other (See Comments)    hallucinations  . Vicodin (Hydrocodone-Acetaminophen)   . Vioxx (Rofecoxib)     Current Outpatient Prescriptions  Medication Sig Dispense Refill  . aspirin EC 325 MG EC tablet Take 1 tablet (325 mg total) by mouth daily.  30 tablet    . metFORMIN (GLUMETZA) 1000 MG (MOD) 24 hr tablet Take 1,000 mg by mouth 2 (two) times daily with a meal.       . metoprolol tartrate (LOPRESSOR) 25 MG tablet Take 25 mg by mouth 2 (two) times daily.      [name] NITROGLYCERIN PO Take 0.4 mg by mouth as needed. For chest pain.      . rosuvastatin (CRESTOR) 40 MG tablet One daily to lower cholessterol  90 tablet  3  .  valsartan-hydrochlorothiazide (DIOVAN-HCT) 320-25 MG per tablet Take 1 tablet by mouth daily. For blood pressure.      . metoprolol tartrate (LOPRESSOR) 12.5 mg TABS Take by mouth 2 (two) times daily.       No current facility-administered medications for this visit.    Past Medical History  Diagnosis Date  . Arthritis   . Atrial fibrillation 02/21/2011  . Hypertension   . Hyperlipidemia   . Myocardial infarction   . Diabetes mellitus age 42  . CAD (coronary artery disease)     prior coronary stenting  . Stroke 2001  . Peripheral vascular disease   . Carotid artery disease 2001    s/p Bilateral CEA, after CVA  . TR (tricuspid regurgitation), severe 12/02/12 for repair with CABG 12/03/2012  . Pulmonary HTN 12/03/2012  . Dyspnea 03/13/2006    Myoview - EF 76%; mild ischemia in apical lateral region, less severe than previous study in 2002  . H/O endarterectomy 09/11/2012    patent L and R carotid sites, R 60-79% restenosed ICA; L <40% restenosed ICA  . Stenosis of artery 06/03/2012    doppler- most distal aspect of abd aorta no aneurysmal dilation; bilateral ABIs - mild L arterial insufficiency, L CIA narrowing w/ 50-69% diameter reduction; L and R SFA both w/ 0-49% diameter reductions  . H/O endarterectomy 01/08/2012    doppler - R distal common carotid/proximal ICA stenosed 80-99%, L 0-39%  .  Decreased pedal pulses 06/04/2011    doppler - bilateral ABIs no evidence of insufficiency; L CIA slightly elevated velocities 20-30% diameter reduction;   . Limb pain 05/02/2011    doppler - no evidence of thrombus of thrombophlebitis  . Diabetes   . H/O carotid endarterectomy 03/06/2012    CT angio -high grade stenosis of proximal R internal carotid w/ lg posterior ulceration or dissection flap; near occlusive stenosis at origin of nondominantproximal L vertebral artery; <50% stenosis of proximal R vertebral and proximal L subclavian artery  . Urinary tract infection, site not specified   . Tobacco use  disorder   . Unspecified cataract   . Myalgia and myositis, unspecified   . Cervicalgia   . Acute upper respiratory infections of unspecified site   . Tobacco use disorder   . Peripheral vascular disease, unspecified   . Atrial fibrillation   . Phlebitis and thrombophlebitis of superficial vessels of lower extremities   . Edema   . Long term (current) use of anticoagulants   . Loss of weight   . Abdominal pain, epigastric   . Atrial fibrillation   . Benign paroxysmal positional vertigo   . Type II or unspecified type diabetes mellitus with peripheral circulatory disorders, uncontrolled(250.72)   . Unspecified vitamin D deficiency   . Other manifestations of vitamin A deficiency   . Type II or unspecified type diabetes mellitus with peripheral circulatory disorders, not stated as uncontrolled(250.70)   . Primary localized osteoarthrosis, hand   . Abdominal pain, epigastric   . Nontoxic uninodular goiter   . Nonspecific abnormal results of liver function study   . Candidiasis of vulva and vagina   . Routine gynecological examination   . Unspecified disorder of iris and ciliary body   . Spinal stenosis, unspecified region other than cervical   . Other malaise and fatigue   . Pain in joint, pelvic region and thigh   . Cervicalgia   . Other and unspecified hyperlipidemia   . Edema   . Coronary atherosclerosis of unspecified type of vessel, native or graft   . Pain in limb   . Osteoarthrosis, unspecified whether generalized or localized, unspecified site   . Occlusion and stenosis of carotid artery without mention of cerebral infarction   . Lumbago   . Myalgia and myositis, unspecified     Past Surgical History  Procedure Laterality Date  . Carotid endarterectomy  2001    Bilateral  . Cholecystectomy    . Kidney stone surgery      Removal  . Back surgery  [phone]    three previous surgeries  . Angioplasty  1984 and 1982  . Abdominal hysterectomy  1975  .  Percutaneous coronary stent intervention (pci-s)  2002    2 BMS in ostial/Prox & mid RCA (mid 2.5 mm 20x  mm, & 2.75 mm x  8 mm ostial)  . Coronary artery bypass graft  12/04/2012    Procedure: CORONARY ARTERY BYPASS GRAFTING (CABG);  Surgeon: Kerin Perna, MD;  Location: [facility] OR;  Service: Open Heart Surgery;  Laterality: N/A;  . Intraoperative transesophageal echocardiogram  12/04/2012    Procedure: INTRAOPERATIVE TRANSESOPHAGEAL ECHOCARDIOGRAM;  Surgeon: Kerin Perna, MD;  Location: [facility] OR;  Service: Open Heart Surgery;  Laterality: N/A;  . Appendectomy  1954  . Tee with cardioversion  01/23/2011    EF 50-55%; grade 2 diastolic dysfunction, elevated mean LA filling pressure, RV systolic pressure increased consistent w/ mod pulmonary hypertension  . Arch aortogram  03/30/2011    40% recurrent stenosis at origin of R proximal carotid patch, shelf like recurrent stenosis w/in midsection of patch that does not appear to be flow limiting; normal non-stenosed great vessel origins    Family History  Problem Relation Age of Onset  . Other Mother     CVA  . Diabetes Mother   . Heart disease Mother     Heart disease before age 74  . Heart attack Mother   . Heart disease Father     Heart Disease before age 60  . Heart attack Father   . Heart disease Sister     Amputation  . Diabetes Sister   . Heart disease Brother     Heart disease before age 89  . Diabetes Brother   . Stroke Brother   . Stroke Brother   . Heart disease Brother   . Stroke Brother   . Heart disease Brother   . Heart disease Sister   . Heart disease Sister   . Diabetes Other   . Cancer Other     breast  . Heart disease Other     cad  . Other Other     cva  . Diabetes Daughter   . Cancer Sister     History   Social History  . Marital Status: Married    Spouse Name: N/A    Number of Children: N/A  . Years of Education: N/A   Occupational History  . Not on file.   Social History Main Topics  . Smoking  status: Former Smoker -- 0.30 packs/day for 57 years    Types: Cigarettes  . Smokeless tobacco: Former Neurosurgeon    Quit date: 11/19/2012     Comment: pt states she is working on it but husband just passed  . Alcohol Use: No  . Drug Use: No  . Sexually Active: Not on file   Other Topics Concern  . Not on file   Social History Narrative  . No narrative on file    Review of systems: The patient specifically denies any chest pain at rest or with exertion, dyspnea at rest or with exertion, orthopnea, paroxysmal nocturnal dyspnea, syncope, palpitations, focal neurological deficits (except numbness surrounding the sternotomy), intermittent claudication, lower extremity edema, unexplained weight gain, cough, hemoptysis or wheezing.  The patient also denies abdominal pain, nausea, vomiting, dysphagia, diarrhea, constipation, polyuria, polydipsia, dysuria, hematuria, frequency, urgency, abnormal bleeding or bruising, fever, chills, unexpected weight changes, mood swings, change in skin or hair texture, change in voice quality, auditory or visual problems, allergic reactions or rashes, new musculoskeletal complaints other than usual "aches and pains".   PHYSICAL EXAM BP 154/78  Pulse 63  Resp 14  Ht 5\' 3"  (1.6 m)  Wt 145 lb 11.2 oz (66.089 kg)  BMI 25.82 kg/m2  General: Alert, oriented x3, no distress Head: no evidence of trauma, PERRL, EOMI, no exophtalmos or lid lag, no myxedema, no xanthelasma; normal ears, nose and oropharynx Neck: normal jugular venous pulsations and no hepatojugular reflux; brisk carotid pulses without delay but with bilateral carotid bruits, right greater than left; well-healed bilateral endarterectomy scars no carotid bruits Chest: clear to auscultation, no signs of consolidation by percussion or palpation, normal fremitus, symmetrical and full respiratory excursions Cardiovascular: normal position and quality of the apical impulse, regular rhythm, normal first and second  heart sounds,1/ 6 holosystolic murmur at the left lower sternal border, rubs or gallops Abdomen: no tenderness or distention, no masses by palpation, no  abnormal pulsatility or arterial bruits, normal bowel sounds, no hepatosplenomegaly Extremities: no clubbing, cyanosis or edema; 2+ radial, ulnar and brachial pulses bilaterally; 2+ right femoral, posterior tibial and dorsalis pedis pulses; 2+ left femoral, posterior tibial and dorsalis pedis pulses; she has bilateral subclavian and bilateral femoral bruits Neurological: grossly nonfocal   EKG: Sinus rhythm with borderline criteria for left ventricular hypertrophy and delayed anterior R-wave progression  Lipid Panel     Component Value Date/Time   CHOL 160 11/28/2012 0239   TRIG 62 03/02/2013 1149   HDL 56 03/02/2013 1149   HDL 49 11/28/2012 0239   CHOLHDL 4.8* 03/02/2013 1149   CHOLHDL 3.3 11/28/2012 0239   VLDL 12 11/28/2012 0239   LDLCALC 198* 03/02/2013 1149   LDLCALC 99 11/28/2012 0239    BMET    Component Value Date/Time   NA 140 03/02/2013 1149   NA 134* 12/11/2012 0540   K 5.3* 03/02/2013 1149   CL 103 03/02/2013 1149   CO2 25 03/02/2013 1149   GLUCOSE 110* 03/02/2013 1149   GLUCOSE 111* 12/11/2012 0540   BUN 18 03/02/2013 1149   BUN 36* 12/11/2012 0540   CREATININE 1.23* 03/02/2013 1149   CALCIUM 9.9 03/02/2013 1149   GFRNONAA 43* 03/02/2013 1149   GFRAA 50* 03/02/2013 1149     ASSESSMENT AND PLAN CAD (coronary artery disease): 1993 PTCA to circumflex.  2002 2 BMS stents to ostial, S/P CABG x 3 12/04/12 Now roughly 6 months following bypass surgery she finally feels that she is beginning to recover. Her recovery was delayed by perioperative atrial fibrillation and poorly tolerated amiodarone therapy. It is also been just over a year since her husband passed away and she is still grieving. She is free of angina and can perform physical activity without shortness of breath. She still feels that the parasternal area is numb. We discussed the  fact that respect to modification is still critical to prevent recurrence of coronary problems. She has lost a lot of weight concern again at back. She would try to continue physical activity and calorie restriction.  DM2 (diabetes mellitus, type 2) She reports good glycemic control  HTN (hypertension) Her blood pressure is high. She is to start taking the metoprolol 25 mg twice daily. I do not think this will cause significant bradycardia.  HLD (hyperlipidemia) The most recent lipid profile was performed well she was off statin therapy. Chris. 4 severe GI upset, which was then clearly attributed to amiodarone. She is now back on a statin. It is time to repeat her labs. In the past while on Crestor her LDL cholesterol is just under 100.  Recurrent carotid stenosis She has recently seen Dr. Darrick Penna and there is restenosis in the right carotid to approximately 80% severity. She has undergone previous bilateral carotid endarterectomy. Discussed the fact that a stent in the superior option to repeat surgery.  Atrial fibrillation This appears to have been a perioperative problem, has also had it in the past. Recently it has not recurred. She is not on anticoagulation secondary to severe GI bleeding while on Xarelto. She is not taking any antiarrhythmics. She tolerated amiodarone poorly.  PAD (peripheral artery disease) Her most recent Doppler study in July of 2013 showed a 50-69% stenosis in the left common iliac artery there was three-vessel runoff in the calf on the right side there was two-vessel runoff to 2 occlusion of the anterior tibial artery with distal reconstitution; bilaterally the ABIs were normal; she does not describe claudication today.  Orders Placed This Encounter  Procedures  . Lipid Profile  . Comp Met (CMET)  . EKG 12-Lead   Meds ordered this encounter  Medications  . metoprolol tartrate (LOPRESSOR) 25 MG tablet    Sig: Take 25 mg by mouth 2 (two) times daily.  .  metoprolol tartrate (LOPRESSOR) 12.5 mg TABS    Sig: Take by mouth 2 (two) times daily.    Junious Silk, MD, [facility] [phone] office [phone] pager

## 2013-04-25 NOTE — Assessment & Plan Note (Signed)
She has recently seen Dr. Darrick Penna and there is restenosis in the right carotid to approximately 80% severity. She has undergone previous bilateral carotid endarterectomy. Discussed the fact that a stent in the superior option to repeat surgery.

## 2013-04-25 NOTE — Assessment & Plan Note (Signed)
Now roughly 6 months following bypass surgery she finally feels that she is beginning to recover. Her recovery was delayed by perioperative atrial fibrillation and poorly tolerated amiodarone therapy. It is also been just over a year since her husband passed away and she is still grieving. She is free of angina and can perform physical activity without shortness of breath. She still feels that the parasternal area is numb. We discussed the fact that respect to modification is still critical to prevent recurrence of coronary problems. She has lost a lot of weight concern again at back. She would try to continue physical activity and calorie restriction.

## 2013-04-25 NOTE — Assessment & Plan Note (Signed)
She reports good glycemic control

## 2013-05-28 ENCOUNTER — Other Ambulatory Visit: Payer: Medicare Other

## 2013-05-28 ENCOUNTER — Ambulatory Visit: Payer: Medicare Other | Admitting: Vascular Surgery

## 2013-06-01 ENCOUNTER — Other Ambulatory Visit: Payer: Medicare Other

## 2013-06-01 DIAGNOSIS — E785 Hyperlipidemia, unspecified: Secondary | ICD-10-CM

## 2013-06-01 DIAGNOSIS — IMO0001 Reserved for inherently not codable concepts without codable children: Secondary | ICD-10-CM | POA: Diagnosis not present

## 2013-06-02 LAB — MICROALBUMIN / CREATININE URINE RATIO
Creatinine, Ur: 95.8 mg/dL (ref 15.0–278.0)
MICROALB/CREAT RATIO: 34.4 mg/g creat — ABNORMAL HIGH (ref 0.0–30.0)
Microalbumin, Urine: 33 ug/mL — ABNORMAL HIGH (ref 0.0–17.0)

## 2013-06-02 LAB — HEMOGLOBIN A1C: Est. average glucose Bld gHb Est-mCnc: 140 mg/dL

## 2013-06-02 LAB — BASIC METABOLIC PANEL
BUN/Creatinine Ratio: 27 — ABNORMAL HIGH (ref 11–26)
BUN: 28 mg/dL — ABNORMAL HIGH (ref 8–27)
Chloride: 100 mmol/L (ref 97–108)
GFR calc Af Amer: 62 mL/min/{1.73_m2} (ref >59)
GFR calc non Af Amer: 54 mL/min/{1.73_m2} — ABNORMAL LOW (ref >59)

## 2013-06-02 LAB — LIPID PANEL
Cholesterol, Total: 171 mg/dL (ref 100–199)
VLDL Cholesterol Cal: 14 mg/dL (ref 5–40)

## 2013-06-03 ENCOUNTER — Ambulatory Visit (INDEPENDENT_AMBULATORY_CARE_PROVIDER_SITE_OTHER): Payer: Medicare Other | Admitting: Internal Medicine

## 2013-06-03 ENCOUNTER — Encounter: Payer: Self-pay | Admitting: Internal Medicine

## 2013-06-03 ENCOUNTER — Encounter: Payer: Self-pay | Admitting: Vascular Surgery

## 2013-06-03 VITALS — BP 130/80 | HR 55 | Temp 98.3°F | Resp 16 | Ht 63.0 in | Wt 149.2 lb

## 2013-06-03 DIAGNOSIS — E785 Hyperlipidemia, unspecified: Secondary | ICD-10-CM | POA: Diagnosis not present

## 2013-06-03 DIAGNOSIS — R002 Palpitations: Secondary | ICD-10-CM

## 2013-06-03 DIAGNOSIS — I4891 Unspecified atrial fibrillation: Secondary | ICD-10-CM

## 2013-06-03 DIAGNOSIS — I2510 Atherosclerotic heart disease of native coronary artery without angina pectoris: Secondary | ICD-10-CM | POA: Diagnosis not present

## 2013-06-03 DIAGNOSIS — I10 Essential (primary) hypertension: Secondary | ICD-10-CM

## 2013-06-03 DIAGNOSIS — I6529 Occlusion and stenosis of unspecified carotid artery: Secondary | ICD-10-CM

## 2013-06-03 DIAGNOSIS — E119 Type 2 diabetes mellitus without complications: Secondary | ICD-10-CM | POA: Diagnosis not present

## 2013-06-03 DIAGNOSIS — I739 Peripheral vascular disease, unspecified: Secondary | ICD-10-CM

## 2013-06-03 NOTE — Patient Instructions (Signed)
Continue current medications. 

## 2013-06-03 NOTE — Progress Notes (Signed)
Subjective:    Patient ID: Kendra Todd, female    DOB: 08/17/1941, 72 y.o.   MRN: [id number]  HPI CAD (coronary artery disease): 1993 PTCA to circumflex.  2002 2 BMS stents to ostial, S/P CABG x 3 12/04/12: currently asymptomatic. She is at high risk for developing further dramatic coronary artery disease.  DM2 (diabetes mellitus, type 2): Controlled  HTN (hypertension): Controlled  HLD (hyperlipidemia): Controlled  Recurrent carotid stenosis, unspecified laterality: Asymptomatic. Patient has diffuse vascular disease.  Atrial fibrillation: Normal sinus rhythm today  Palpitations: Improved  PAD (peripheral artery disease): Leg discomfort with walking 100 feet and sometimes at rest. Diminished posterior tibial and dorsalis pedis pulses bilaterally.    Current Outpatient Prescriptions on File Prior to Visit  Medication Sig Dispense Refill  . aspirin EC 325 MG EC tablet Take 1 tablet (325 mg total) by mouth daily.  30 tablet    . metFORMIN (GLUMETZA) 1000 MG (MOD) 24 hr tablet Take 1,000 mg by mouth 2 (two) times daily with a meal.       . metoprolol tartrate (LOPRESSOR) 25 MG tablet Take 25 mg by mouth 2 (two) times daily.      [name] NITROGLYCERIN PO Take 0.4 mg by mouth as needed. For chest pain.      . rosuvastatin (CRESTOR) 40 MG tablet One daily to lower cholessterol  90 tablet  3  . valsartan-hydrochlorothiazide (DIOVAN-HCT) 320-25 MG per tablet Take 1 tablet by mouth daily. For blood pressure.       No current facility-administered medications on file prior to visit.   Past Surgical History  Procedure Laterality Date  . Carotid endarterectomy  2001    Bilateral  . Cholecystectomy    . Kidney stone surgery      Removal  . Back surgery  [phone]    three previous surgeries  . Angioplasty  1984 and 1982  . Abdominal hysterectomy  1975  . Percutaneous coronary stent intervention (pci-s)  2002    2 BMS in ostial/Prox & mid RCA (mid 2.5 mm 20x  mm, & 2.75 mm x  8 mm  ostial)  . Coronary artery bypass graft  12/04/2012    Procedure: CORONARY ARTERY BYPASS GRAFTING (CABG);  Surgeon: Kerin Perna, MD;  Location: [facility] OR;  Service: Open Heart Surgery;  Laterality: N/A;  . Intraoperative transesophageal echocardiogram  12/04/2012    Procedure: INTRAOPERATIVE TRANSESOPHAGEAL ECHOCARDIOGRAM;  Surgeon: Kerin Perna, MD;  Location: [facility] OR;  Service: Open Heart Surgery;  Laterality: N/A;  . Appendectomy  1954  . Tee with cardioversion  01/23/2011    EF 50-55%; grade 2 diastolic dysfunction, elevated mean LA filling pressure, RV systolic pressure increased consistent w/ mod pulmonary hypertension  . Arch aortogram  03/30/2011    40% recurrent stenosis at origin of R proximal carotid patch, shelf like recurrent stenosis w/in midsection of patch that does not appear to be flow limiting; normal non-stenosed great vessel origins       Review of Systems  Constitutional: Negative.   HENT: Negative.   Eyes: Negative.   Respiratory: Negative.   Cardiovascular: Negative for chest pain, palpitations and leg swelling.  Endocrine: Negative for polydipsia, polyphagia and polyuria.       History diabetes  Genitourinary: Positive for urgency and frequency. Negative for flank pain.  Musculoskeletal: Positive for arthralgias.  Skin: Negative.   Neurological: Negative.   Hematological: Negative.   Psychiatric/Behavioral: Negative.        Objective:BP 130/80  Pulse 55  Temp(Src) 98.3 F (36.8 C) (Oral)  Resp 16  Ht 5\' 3"  (1.6 m)  Wt 149 lb 3.2 oz (67.677 kg)  BMI 26.44 kg/m2  SpO2 96%    Physical Exam  Constitutional: She is oriented to person, place, and time. She appears well-developed and well-nourished. No distress.  HENT:  Head: Normocephalic.  Nose: Nose normal.  Upper and lower dentures present.  Eyes: Conjunctivae are normal. Pupils are equal, round, and reactive to light.  Wears prescription lenses. Right cataract.  Neck: Normal range of motion. Neck  supple. No JVD present. No tracheal deviation present. No thyromegaly present.  Cardiovascular: Normal rate, regular rhythm and normal heart sounds.  Exam reveals no gallop and no friction rub.   No murmur heard. Diminished dorsalis pedis pulses bilaterally and diminished posterior tibial pulses bilaterally.  Pulmonary/Chest: Effort normal and breath sounds normal. No respiratory distress. She has no wheezes. She has no rales. She exhibits no tenderness.  Abdominal: Soft. Bowel sounds are normal. She exhibits no distension and no mass. There is no tenderness.  Musculoskeletal: She exhibits edema. She exhibits no tenderness.  Trace pedal edema  Lymphadenopathy:    She has no cervical adenopathy.  Neurological: She is alert and oriented to person, place, and time. She has normal reflexes. No cranial nerve deficit. Coordination normal.  Skin: Skin is warm and dry. No rash noted. No erythema. No pallor.  Psychiatric: She has a normal mood and affect. Her behavior is normal. Judgment normal.      Appointment on 06/01/2013  Component Date Value Range Status  . Cholesterol, Total 06/01/2013 171  100 - 199 mg/dL Final  . Triglycerides 06/01/2013 71  0 - 149 mg/dL Final  . HDL 16/08/9603 57  >39 mg/dL Final   Comment: According to ATP-III Guidelines, HDL-C >59 mg/dL is considered a                          negative risk factor for CHD.  [name] VLDL Cholesterol Cal 06/01/2013 14  5 - 40 mg/dL Final  . LDL Calculated 06/01/2013 540* 0 - 99 mg/dL Final  . Chol/HDL Ratio 06/01/2013 3.0  0.0 - 4.4 ratio units Final  . Glucose 06/01/2013 100* 65 - 99 mg/dL Final  . BUN 98/09/9146 28* 8 - 27 mg/dL Final  . Creatinine, Ser 06/01/2013 1.03* 0.57 - 1.00 mg/dL Final  . GFR calc non Af Amer 06/01/2013 54* >59 mL/min/1.73 Final  . GFR calc Af Amer 06/01/2013 62  >59 mL/min/1.73 Final  . BUN/Creatinine Ratio 06/01/2013 27* 11 - 26 Final  . Sodium 06/01/2013 139  134 - 144 mmol/L Final  . Potassium 06/01/2013  4.4  3.5 - 5.2 mmol/L Final  . Chloride 06/01/2013 100  97 - 108 mmol/L Final  . CO2 06/01/2013 26  18 - 29 mmol/L Final  . Calcium 06/01/2013 9.9  8.6 - 10.2 mg/dL Final  . Hemoglobin W2N 06/01/2013 6.5* 4.8 - 5.6 % Final   Comment:          Increased risk for diabetes: 5.7 - 6.4                                   Diabetes: >6.4  Glycemic control for adults with diabetes: <7.0  . Estimated average glucose 06/01/2013 140   Final  . Creatinine, Ur 06/01/2013 95.8  15.0 - 278.0 mg/dL Final  . Microalbum.,U,Random 06/01/2013 33.0* 0.0 - 17.0 ug/mL Final  . MICROALB/CREAT RATIO 06/01/2013 34.4* 0.0 - 30.0 mg/g creat Final       Assessment & Plan:  CAD (coronary artery disease): 1993 PTCA to circumflex.  2002 2 BMS stents to ostial, S/P CABG x 3 12/04/12: Continue modified diet to lower cholesterol. He is on cholesterol lowering medications. He is asymptomatic at present. Diabetes and blood pressure are under good control.  DM2 (diabetes mellitus, type 2): Well controlled   - Plan: Hemoglobin A1c, CMP  HTN (hypertension): Controlled  HLD (hyperlipidemia) : Controlled  - Plan: Lipid panel  Recurrent carotid stenosis, unspecified laterality: Asymptomatic  Atrial fibrillation: appears to be in normal sinus rhythm today  Palpitations: Improved  PAD (peripheral artery disease) - Plan: Lower Extremity Arterial Duplex

## 2013-06-04 ENCOUNTER — Ambulatory Visit: Payer: Medicare Other | Admitting: Vascular Surgery

## 2013-06-04 ENCOUNTER — Other Ambulatory Visit (INDEPENDENT_AMBULATORY_CARE_PROVIDER_SITE_OTHER): Payer: Medicare Other | Admitting: Vascular Surgery

## 2013-06-04 DIAGNOSIS — Z48812 Encounter for surgical aftercare following surgery on the circulatory system: Secondary | ICD-10-CM

## 2013-06-04 DIAGNOSIS — I6529 Occlusion and stenosis of unspecified carotid artery: Secondary | ICD-10-CM | POA: Diagnosis not present

## 2013-06-05 ENCOUNTER — Other Ambulatory Visit: Payer: Self-pay | Admitting: *Deleted

## 2013-06-05 DIAGNOSIS — Z48812 Encounter for surgical aftercare following surgery on the circulatory system: Secondary | ICD-10-CM

## 2013-06-07 ENCOUNTER — Other Ambulatory Visit: Payer: Self-pay | Admitting: Internal Medicine

## 2013-06-08 ENCOUNTER — Telehealth: Payer: Self-pay | Admitting: Cardiovascular Disease

## 2013-06-08 NOTE — Telephone Encounter (Signed)
Pt called stating that she thinks she needs a doppler on her rt leg because she thinks she has a blockage around her knee. Please advise if she should make an appt for this test or if she should make an appt to see Dr. Salena Saner first.

## 2013-06-08 NOTE — Telephone Encounter (Signed)
Returned call.  Pt stated she has been having a lot of pain in the back of her R knee and her leg gets so weak she almost falls.  Pt also c/o numbness and foot feeling cold all of the time.  Stated it is the same leg she had the vein removed for the open heart surgery.  Pt requesting to come in to have it looked at.  Denied discoloration.  Stated she went to her MD last week and he could barely find her pulse.  Offered appt today for evaluation.  Pt requested to be seen tomorrow.  Appt scheduled for 8.5.14 at 3pm w/ B. Leron Croak, PA-C for evaluation.  Pt agreed w/ plan.

## 2013-06-09 ENCOUNTER — Ambulatory Visit (INDEPENDENT_AMBULATORY_CARE_PROVIDER_SITE_OTHER): Payer: Medicare Other | Admitting: Physician Assistant

## 2013-06-09 ENCOUNTER — Encounter: Payer: Self-pay | Admitting: Physician Assistant

## 2013-06-09 ENCOUNTER — Other Ambulatory Visit: Payer: Self-pay | Admitting: Internal Medicine

## 2013-06-09 ENCOUNTER — Encounter: Payer: Self-pay | Admitting: Vascular Surgery

## 2013-06-09 VITALS — BP 158/80 | HR 52 | Ht 63.0 in | Wt 148.9 lb

## 2013-06-09 DIAGNOSIS — I10 Essential (primary) hypertension: Secondary | ICD-10-CM | POA: Diagnosis not present

## 2013-06-09 MED ORDER — HYDRALAZINE HCL 25 MG PO TABS: 25.0000 mg | ORAL_TABLET | Freq: Three times a day (TID) | ORAL | Status: DC

## 2013-06-09 NOTE — Progress Notes (Signed)
Date:  06/09/2013   ID:  Kendra Todd, DOB November 06, 1940, MRN [id number]  PCP:  Kimber Relic, MD  Primary Cardiologist:  [facility]     History of Present Illness: Kendra Todd is a 72 y.o. female history of coronary artery disease with coronary artery bypass grafting times 12/08/2012. She had aleft internal mammary artery to LAD, saphenous vein graft to circumflex marginal, saphenous vein graft to right coronary artery.  Her history also includes paroxysmal atrial fibrillation postop, hypertension, hyperlipidemia, diabetes mellitus, CVA, peripheral vascular disease and has had bilateral carotid endarterectomies.  Was thought that her tricuspid regurgitation with severe needed surgery however during coronary bypass grafting and intraoperative TEE showed mild to moderate tricuspid regurgitation therefore no intervention was performed the valve.  Recent carotid Doppler showed 80% restenosis in the right carotid. She's not on anticoagulation for postop atrophic ablation U. to gastrointestinal bleeding while on Xarelto.  She presents today for followup blood pressure check. He continues to complain of some numbness in the left side of her chest above her breast. Soon ongoing since her surgery. She reports a sharp burning type pain the incision site on the medial aspect just below her right knee where the veins were harvested. She says Tylenol helps for that  The patient currently denies nausea, vomiting, fever, chest pain, shortness of breath, orthopnea, dizziness, PND, cough, congestion, abdominal pain, hematochezia, melena, lower extremity edema, claudication.  Wt Readings from Last 3 Encounters:  06/09/13 148 lb 14.4 oz (67.541 kg)  06/03/13 149 lb 3.2 oz (67.677 kg)  04/22/13 145 lb 11.2 oz (66.089 kg)     Past Medical History  Diagnosis Date  . Arthritis   . Atrial fibrillation 02/21/2011  . Hypertension   . Hyperlipidemia   . Myocardial infarction   . Diabetes mellitus age 28  . CAD  (coronary artery disease)     prior coronary stenting  . Stroke 2001  . Peripheral vascular disease   . Carotid artery disease 2001    s/p Bilateral CEA, after CVA  . TR (tricuspid regurgitation), severe 12/02/12 for repair with CABG 12/03/2012  . Pulmonary HTN 12/03/2012  . Dyspnea 03/13/2006    Myoview - EF 76%; mild ischemia in apical lateral region, less severe than previous study in 2002  . H/O endarterectomy 09/11/2012    patent L and R carotid sites, R 60-79% restenosed ICA; L <40% restenosed ICA  . Stenosis of artery 06/03/2012    doppler- most distal aspect of abd aorta no aneurysmal dilation; bilateral ABIs - mild L arterial insufficiency, L CIA narrowing w/ 50-69% diameter reduction; L and R SFA both w/ 0-49% diameter reductions  . H/O endarterectomy 01/08/2012    doppler - R distal common carotid/proximal ICA stenosed 80-99%, L 0-39%  . Decreased pedal pulses 06/04/2011    doppler - bilateral ABIs no evidence of insufficiency; L CIA slightly elevated velocities 20-30% diameter reduction;   . Limb pain 05/02/2011    doppler - no evidence of thrombus of thrombophlebitis  . Diabetes   . H/O carotid endarterectomy 03/06/2012    CT angio -high grade stenosis of proximal R internal carotid w/ lg posterior ulceration or dissection flap; near occlusive stenosis at origin of nondominantproximal L vertebral artery; <50% stenosis of proximal R vertebral and proximal L subclavian artery  . Urinary tract infection, site not specified   . Tobacco use disorder   . Unspecified cataract   . Myalgia and myositis, unspecified   . Cervicalgia   .  Acute upper respiratory infections of unspecified site   . Tobacco use disorder   . Peripheral vascular disease, unspecified   . Atrial fibrillation   . Phlebitis and thrombophlebitis of superficial vessels of lower extremities   . Edema   . Long term (current) use of anticoagulants   . Loss of weight   . Abdominal pain, epigastric   . Atrial fibrillation     . Benign paroxysmal positional vertigo   . Type II or unspecified type diabetes mellitus with peripheral circulatory disorders, uncontrolled(250.72)   . Unspecified vitamin D deficiency   . Other manifestations of vitamin A deficiency   . Type II or unspecified type diabetes mellitus with peripheral circulatory disorders, not stated as uncontrolled(250.70)   . Primary localized osteoarthrosis, hand   . Abdominal pain, epigastric   . Nontoxic uninodular goiter   . Nonspecific abnormal results of liver function study   . Candidiasis of vulva and vagina   . Routine gynecological examination   . Unspecified disorder of iris and ciliary body   . Spinal stenosis, unspecified region other than cervical   . Other malaise and fatigue   . Pain in joint, pelvic region and thigh   . Cervicalgia   . Other and unspecified hyperlipidemia   . Edema   . Coronary atherosclerosis of unspecified type of vessel, native or graft   . Pain in limb   . Osteoarthrosis, unspecified whether generalized or localized, unspecified site   . Occlusion and stenosis of carotid artery without mention of cerebral infarction   . Lumbago   . Myalgia and myositis, unspecified     Current Outpatient Prescriptions  Medication Sig Dispense Refill  . aspirin EC 325 MG EC tablet Take 1 tablet (325 mg total) by mouth daily.  30 tablet    . metFORMIN (GLUMETZA) 1000 MG (MOD) 24 hr tablet Take 1,000 mg by mouth 2 (two) times daily with a meal.       . metoprolol tartrate (LOPRESSOR) 25 MG tablet Take 25 mg by mouth 2 (two) times daily.      [name] NITROGLYCERIN PO Take 0.4 mg by mouth as needed. For chest pain.      . rosuvastatin (CRESTOR) 40 MG tablet One daily to lower cholessterol  90 tablet  3  . valsartan-hydrochlorothiazide (DIOVAN-HCT) 320-25 MG per tablet Take 1 tablet by mouth daily. For blood pressure.      [name] acetaminophen-codeine (TYLENOL #3) 300-30 MG per tablet TAKE 1 TABLET BY MOUTH EVERY 4 HOURS AS NEEDED FOR PAIN   360 tablet  0  . hydrALAZINE (APRESOLINE) 25 MG tablet Take 1 tablet (25 mg total) by mouth 3 (three) times daily.  90 tablet  3   No current facility-administered medications for this visit.    Allergies:    Allergies  Allergen Reactions  . Iron Hives  . Norvasc (Amlodipine Besylate)   . Promethazine Other (See Comments)    hallucinations  . Vicodin (Hydrocodone-Acetaminophen)   . Vioxx (Rofecoxib)     Social History:  The patient  reports that she quit smoking about 6 months ago. Her smoking use included Cigarettes. She has a 17.1 pack-year smoking history. She has never used smokeless tobacco. She reports that  drinks alcohol. She reports that she does not use illicit drugs.   Family history:   Family History  Problem Relation Age of Onset  . Other Mother     CVA  . Diabetes Mother   . Heart disease Mother  Heart disease before age 85  . Heart attack Mother   . Heart disease Father     Heart Disease before age 87  . Heart attack Father   . Heart disease Sister     Amputation  . Diabetes Sister   . Heart disease Brother     Heart disease before age 61  . Diabetes Brother   . Stroke Brother   . Stroke Brother   . Heart disease Brother   . Stroke Brother   . Heart disease Brother   . Heart disease Sister   . Heart disease Sister   . Diabetes Other   . Cancer Other     breast  . Heart disease Other     cad  . Other Other     cva  . Diabetes Daughter   . Cancer Sister     ROS:  Please see the history of present illness.  All other systems reviewed and negative.   PHYSICAL EXAM: VS:  BP 158/80  Pulse 52  Ht 5\' 3"  (1.6 m)  Wt 148 lb 14.4 oz (67.541 kg)  BMI 26.38 kg/m2 Well nourished, well developed, in no acute distress HEENT: Pupils are equal round react to light accommodation extraocular movements are intact.  Neck: no JVDNo cervical lymphadenopathy. Bilateral carotid Bruit. Cardiac: Regular rate and rhythm without murmurs rubs or gallops. Lungs:   clear to auscultation bilaterally, no wheezing, rhonchi or rales Abd: soft, nontender, positive bowel sounds all quadrants Ext: no lower extremity edema.  2+ radial and dorsalis pedis pulses. Skin: warm and dry Neuro:  Grossly normal       ASSESSMENT AND PLAN:  Problem List Items Addressed This Visit   HTN (hypertension) - Primary (Chronic)     Blood pressure still not well controlled.  Will try adding amlodipine however she has an allergy to this medication and her heart rate is too slow to increase her beta blocker. Will go ahead and add hydralazine 25 mg 3 times daily. She can followup in 3 months with Dr. Royann Shivers.    Relevant Medications      hydrALAZINE (APRESOLINE) tablet

## 2013-06-09 NOTE — Patient Instructions (Signed)
Start hydralazine 25 Mg three times daily. Follow up with Dr. Salena Saner in three months.

## 2013-06-09 NOTE — Assessment & Plan Note (Signed)
Blood pressure still not well controlled.  Will try adding amlodipine however she has an allergy to this medication and her heart rate is too slow to increase her beta blocker. Will go ahead and add hydralazine 25 mg 3 times daily. She can followup in 3 months with Dr. Royann Shivers.

## 2013-06-16 ENCOUNTER — Ambulatory Visit: Payer: Medicare Other | Admitting: Cardiology

## 2013-07-17 ENCOUNTER — Other Ambulatory Visit: Payer: Self-pay | Admitting: Internal Medicine

## 2013-08-10 DIAGNOSIS — H4011X0 Primary open-angle glaucoma, stage unspecified: Secondary | ICD-10-CM | POA: Diagnosis not present

## 2013-08-10 DIAGNOSIS — H2510 Age-related nuclear cataract, unspecified eye: Secondary | ICD-10-CM | POA: Diagnosis not present

## 2013-09-10 ENCOUNTER — Ambulatory Visit (INDEPENDENT_AMBULATORY_CARE_PROVIDER_SITE_OTHER): Payer: Medicare Other | Admitting: Cardiovascular Disease

## 2013-09-10 ENCOUNTER — Encounter: Payer: Self-pay | Admitting: Cardiovascular Disease

## 2013-09-10 VITALS — BP 166/70 | HR 90 | Ht 63.0 in | Wt 150.0 lb

## 2013-09-10 DIAGNOSIS — E785 Hyperlipidemia, unspecified: Secondary | ICD-10-CM | POA: Diagnosis not present

## 2013-09-10 DIAGNOSIS — I4891 Unspecified atrial fibrillation: Secondary | ICD-10-CM

## 2013-09-10 DIAGNOSIS — I6521 Occlusion and stenosis of right carotid artery: Secondary | ICD-10-CM

## 2013-09-10 DIAGNOSIS — I150 Renovascular hypertension: Secondary | ICD-10-CM

## 2013-09-10 DIAGNOSIS — I2510 Atherosclerotic heart disease of native coronary artery without angina pectoris: Secondary | ICD-10-CM | POA: Diagnosis not present

## 2013-09-10 DIAGNOSIS — I739 Peripheral vascular disease, unspecified: Secondary | ICD-10-CM

## 2013-09-10 DIAGNOSIS — I6529 Occlusion and stenosis of unspecified carotid artery: Secondary | ICD-10-CM

## 2013-09-10 DIAGNOSIS — I10 Essential (primary) hypertension: Secondary | ICD-10-CM

## 2013-09-10 DIAGNOSIS — I701 Atherosclerosis of renal artery: Secondary | ICD-10-CM

## 2013-09-10 MED ORDER — HYDRALAZINE HCL 50 MG PO TABS: 50.0000 mg | ORAL_TABLET | Freq: Three times a day (TID) | ORAL | Status: DC

## 2013-09-10 NOTE — Patient Instructions (Signed)
INCREASE Hydralazine to 50mg  three times a day.  Your physician has requested that you have a renal artery duplex. During this test, an ultrasound is used to evaluate blood flow to the kidneys. Allow one hour for this exam. Do not eat after midnight the day before and avoid carbonated beverages. Take your medications as you usually do.  Your physician recommends that you schedule a follow-up appointment in: After Doppler study.

## 2013-09-16 NOTE — Assessment & Plan Note (Signed)
Followed in [facility] by Dr. Darrick Penna

## 2013-09-16 NOTE — Progress Notes (Signed)
Patient ID: Kendra Todd, female   DOB: 1941-09-11, 72 y.o.   MRN: [id number]      Reason for office visit CAD status post CABG, hypertension, paroxysmal atrial fibrillation, hyperlipidemia, recurrent carotid stenosis, PAD  Complains of pain in her left upper back, which feels swollen for last 2 weeks. No chest pain, SOB or dizziness. Occasional mild bilateral ankle edema. One episode of later blood pressure 202/104. She took an extra Diovan-HCt and rested and BP went back to a normal range after 2 days.  She is active, enjoys family and engaged socially. She seems to be much better and has recovered psychological from the double insult of losing her husband and requiring bypass surgery within the last 2 years.   Carotid duplex ultrasound shows progression of stenosis in the right internal carotid artery at the site of previous endarterectomy, now 60% to 79% stenosis. Possible need for right carotid stent has been discussed. She initially had bilateral carotid endarterectomy in 2001.  She has a history of preserved systolic function but significant diastolic dysfunction with pseudonormalization, that was present even before her coronary problems were fully established. She has severe systemic hypertension that has required numerous medications for adequate control.  As a long-standing history of infrequent episodes of paroxysmal atrial fibrillation that became more of a problem in her immediate postoperative course after bypass surgery. Treatment with amiodarone was poorly tolerated with severe gastrointestinal side effects that lead to a 20 pound weight loss. She has recovered some of the lost weight. She was treated with anticoagulation with Xarelto, which also had to be stopped because of gastrointestinal bleeding.   Allergies  Allergen Reactions  . Iron Hives  . Norvasc [Amlodipine Besylate]   . Promethazine Other (See Comments)    hallucinations  . Vicodin [Hydrocodone-Acetaminophen]   .  Vioxx [Rofecoxib]     Current Outpatient Prescriptions  Medication Sig Dispense Refill  . acetaminophen-codeine (TYLENOL #3) 300-30 MG per tablet TAKE 1 TABLET BY MOUTH EVERY 4 HOURS AS NEEDED FOR PAIN  360 tablet  0  . aspirin EC 325 MG EC tablet Take 1 tablet (325 mg total) by mouth daily.  30 tablet    . latanoprost (XALATAN) 0.005 % ophthalmic solution Place 1 drop into both eyes at bedtime.      . metformin (FORTAMET) 1000 MG (OSM) 24 hr tablet TAKE 1 TABLET BEFORE BREAKFAST AND 1 TABLET BEFORE SUPPER TO CONTROL DIABETES  180 tablet  0  . metoprolol (LOPRESSOR) 50 MG tablet Take 50 mg by mouth 2 (two) times daily.      [name] NITROGLYCERIN PO Take 0.4 mg by mouth as needed. For chest pain.      . rosuvastatin (CRESTOR) 40 MG tablet One daily to lower cholessterol  90 tablet  3  . valsartan-hydrochlorothiazide (DIOVAN-HCT) 320-25 MG per tablet Take 1 tablet by mouth daily. For blood pressure.      . hydrALAZINE (APRESOLINE) 50 MG tablet Take 1 tablet (50 mg total) by mouth 3 (three) times daily.  270 tablet  3   No current facility-administered medications for this visit.    Past Medical History  Diagnosis Date  . Arthritis   . Atrial fibrillation 02/21/2011  . Hypertension   . Hyperlipidemia   . Myocardial infarction   . Diabetes mellitus age 28  . CAD (coronary artery disease)     prior coronary stenting  . Stroke 2001  . Peripheral vascular disease   . Carotid artery disease 2001  s/p Bilateral CEA, after CVA  . TR (tricuspid regurgitation), severe 12/02/12 for repair with CABG 12/03/2012  . Pulmonary HTN 12/03/2012  . Dyspnea 03/13/2006    Myoview - EF 76%; mild ischemia in apical lateral region, less severe than previous study in 2002  . H/O endarterectomy 09/11/2012    patent L and R carotid sites, R 60-79% restenosed ICA; L <40% restenosed ICA  . Stenosis of artery 06/03/2012    doppler- most distal aspect of abd aorta no aneurysmal dilation; bilateral ABIs - mild L arterial  insufficiency, L CIA narrowing w/ 50-69% diameter reduction; L and R SFA both w/ 0-49% diameter reductions  . H/O endarterectomy 01/08/2012    doppler - R distal common carotid/proximal ICA stenosed 80-99%, L 0-39%  . Decreased pedal pulses 06/04/2011    doppler - bilateral ABIs no evidence of insufficiency; L CIA slightly elevated velocities 20-30% diameter reduction;   . Limb pain 05/02/2011    doppler - no evidence of thrombus of thrombophlebitis  . Diabetes   . H/O carotid endarterectomy 03/06/2012    CT angio -high grade stenosis of proximal R internal carotid w/ lg posterior ulceration or dissection flap; near occlusive stenosis at origin of nondominantproximal L vertebral artery; <50% stenosis of proximal R vertebral and proximal L subclavian artery  . Urinary tract infection, site not specified   . Tobacco use disorder   . Unspecified cataract   . Myalgia and myositis, unspecified   . Cervicalgia   . Acute upper respiratory infections of unspecified site   . Tobacco use disorder   . Peripheral vascular disease, unspecified   . Atrial fibrillation   . Phlebitis and thrombophlebitis of superficial vessels of lower extremities   . Edema   . Long term (current) use of anticoagulants   . Loss of weight   . Abdominal pain, epigastric   . Atrial fibrillation   . Benign paroxysmal positional vertigo   . Type II or unspecified type diabetes mellitus with peripheral circulatory disorders, uncontrolled(250.72)   . Unspecified vitamin D deficiency   . Other manifestations of vitamin A deficiency   . Type II or unspecified type diabetes mellitus with peripheral circulatory disorders, not stated as uncontrolled(250.70)   . Primary localized osteoarthrosis, hand   . Abdominal pain, epigastric   . Nontoxic uninodular goiter   . Nonspecific abnormal results of liver function study   . Candidiasis of vulva and vagina   . Routine gynecological examination   . Unspecified disorder of iris and  ciliary body   . Spinal stenosis, unspecified region other than cervical   . Other malaise and fatigue   . Pain in joint, pelvic region and thigh   . Cervicalgia   . Other and unspecified hyperlipidemia   . Edema   . Coronary atherosclerosis of unspecified type of vessel, native or graft   . Pain in limb   . Osteoarthrosis, unspecified whether generalized or localized, unspecified site   . Occlusion and stenosis of carotid artery without mention of cerebral infarction   . Lumbago   . Myalgia and myositis, unspecified     Past Surgical History  Procedure Laterality Date  . Carotid endarterectomy  2001    Bilateral  . Cholecystectomy    . Kidney stone surgery      Removal  . Back surgery  [phone]    three previous surgeries  . Angioplasty  1984 and 1982  . Abdominal hysterectomy  1975  . Percutaneous coronary stent intervention (pci-s)  2002    2 BMS in ostial/Prox & mid RCA (mid 2.5 mm 20x  mm, & 2.75 mm x  8 mm ostial)  . Coronary artery bypass graft  12/04/2012    Procedure: CORONARY ARTERY BYPASS GRAFTING (CABG);  Surgeon: Kerin Perna, MD;  Location: [facility] OR;  Service: Open Heart Surgery;  Laterality: N/A;  . Intraoperative transesophageal echocardiogram  12/04/2012    Procedure: INTRAOPERATIVE TRANSESOPHAGEAL ECHOCARDIOGRAM;  Surgeon: Kerin Perna, MD;  Location: [facility] OR;  Service: Open Heart Surgery;  Laterality: N/A;  . Appendectomy  1954  . Tee with cardioversion  01/23/2011    EF 50-55%; grade 2 diastolic dysfunction, elevated mean LA filling pressure, RV systolic pressure increased consistent w/ mod pulmonary hypertension  . Arch aortogram  03/30/2011    40% recurrent stenosis at origin of R proximal carotid patch, shelf like recurrent stenosis w/in midsection of patch that does not appear to be flow limiting; normal non-stenosed great vessel origins    Family History  Problem Relation Age of Onset  . Other Mother     CVA  . Diabetes Mother   . Heart disease  Mother     Heart disease before age 87  . Heart attack Mother   . Heart disease Father     Heart Disease before age 89  . Heart attack Father   . Heart disease Sister     Amputation  . Diabetes Sister   . Heart disease Brother     Heart disease before age 3  . Diabetes Brother   . Stroke Brother   . Stroke Brother   . Heart disease Brother   . Stroke Brother   . Heart disease Brother   . Heart disease Sister   . Heart disease Sister   . Diabetes Other   . Cancer Other     breast  . Heart disease Other     cad  . Other Other     cva  . Diabetes Daughter   . Cancer Sister     History   Social History  . Marital Status: Married    Spouse Name: N/A    Number of Children: N/A  . Years of Education: N/A   Occupational History  . Not on file.   Social History Main Topics  . Smoking status: Former Smoker -- 0.30 packs/day for 57 years    Types: Cigarettes    Quit date: 11/19/2012  . Smokeless tobacco: Never Used     Comment: pt states she is working on it but husband just passed  . Alcohol Use: 0.0 oz/week    1-2 Glasses of wine per week  . Drug Use: No  . Sexual Activity: Not on file   Other Topics Concern  . Not on file   Social History Narrative  . No narrative on file    Review of systems: The patient specifically denies any chest pain at rest or with exertion, dyspnea at rest or with exertion, orthopnea, paroxysmal nocturnal dyspnea, syncope, palpitations, focal neurological deficits, intermittent claudication, lower extremity edema, unexplained weight gain, cough, hemoptysis or wheezing.  The patient also denies abdominal pain, nausea, vomiting, dysphagia, diarrhea, constipation, polyuria, polydipsia, dysuria, hematuria, frequency, urgency, abnormal bleeding or bruising, fever, chills, unexpected weight changes, mood swings, change in skin or hair texture, change in voice quality, auditory or visual problems, allergic reactions or rashes, new  musculoskeletal complaints other than usual "aches and pains".   PHYSICAL EXAM BP 166/70  Pulse 90  Ht 5\' 3"  (1.6 m)  Wt 150 lb (68.04 kg)  BMI 26.58 kg/m2 General: Alert, oriented x3, no distress  Head: no evidence of trauma, PERRL, EOMI, no exophtalmos or lid lag, no myxedema, no xanthelasma; normal ears, nose and oropharynx  Neck: normal jugular venous pulsations and no hepatojugular reflux; brisk carotid pulses without delay but with bilateral carotid bruits, right greater than left; well-healed bilateral endarterectomy scars no carotid bruits  Chest: clear to auscultation, no signs of consolidation by percussion or palpation, normal fremitus, symmetrical and full respiratory excursions  Cardiovascular: normal position and quality of the apical impulse, regular rhythm, normal first and second heart sounds,1/ 6 holosystolic murmur at the left lower sternal border, rubs or gallops  Abdomen: no tenderness or distention, no masses by palpation, no abnormal pulsatility or arterial bruits, normal bowel sounds, no hepatosplenomegaly  Extremities: no clubbing, cyanosis or edema; 2+ radial, ulnar and brachial pulses bilaterally; 2+ right femoral, posterior tibial and dorsalis pedis pulses; 2+ left femoral, posterior tibial and dorsalis pedis pulses; she has bilateral subclavian and bilateral femoral bruits  Neurological: grossly nonfocal   EKG: Sinus rhythm, left ventricular hypertrophy with secondary repolarization abnormalities  Lipid Panel     Component Value Date/Time   CHOL 160 11/28/2012 0239   TRIG 71 06/01/2013 0853   HDL 57 06/01/2013 0853   HDL 49 11/28/2012 0239   CHOLHDL 3.0 06/01/2013 0853   CHOLHDL 3.3 11/28/2012 0239   VLDL 12 11/28/2012 0239   LDLCALC 100* 06/01/2013 0853   LDLCALC 99 11/28/2012 0239    BMET    Component Value Date/Time   NA 139 06/01/2013 0853   NA 134* 12/11/2012 0540   K 4.4 06/01/2013 0853   CL 100 06/01/2013 0853   CO2 26 06/01/2013 0853   GLUCOSE 100*  06/01/2013 0853   GLUCOSE 111* 12/11/2012 0540   BUN 28* 06/01/2013 0853   BUN 36* 12/11/2012 0540   CREATININE 1.03* 06/01/2013 0853   CALCIUM 9.9 06/01/2013 0853   GFRNONAA 54* 06/01/2013 0853   GFRAA 62 06/01/2013 0853     ASSESSMENT AND PLAN CAD (coronary artery disease): 1993 PTCA to circumflex.  2002 2 BMS stents to ostial, S/P CABG x 3 12/04/12 Asymptomatic despite active lifestyle. No changes are made to her antianginal medications. Risk factor modification his mainstay of therapy. She has had gradual progression of vascular disease in multiple arterial territories and aggressive lowering of cholesterol treatment of hypertension as necessary to prevent need for future coronary revascularization  HLD (hyperlipidemia) She is on maximum dose of rosuvastatin which is her most potent statin. LDL cholesterol control is borderline. Reluctant to add Zetia.  HTN (hypertension) Severe hypertension that is difficult to control despite high doses of multiple blood pressure medications. Increase hydralazine to 50 mg 3 times daily and reevaluate for renal artery stenosis.  PAD (peripheral artery disease) She is known to have a left common iliac artery 50-70% stenosis and occlusion of the right anterior tibial artery, but does not have intermittent claudication and has normal bilateral ABIs  Recurrent carotid stenosis Followed in [facility] by Dr. Darrick Penna   Patient Instructions  INCREASE Hydralazine to 50mg  three times a day.  Your physician has requested that you have a renal artery duplex. During this test, an ultrasound is used to evaluate blood flow to the kidneys. Allow one hour for this exam. Do not eat after midnight the day before and avoid carbonated beverages. Take your medications as you usually do.  Your physician recommends  that you schedule a follow-up appointment in: After Doppler study.     Orders Placed This Encounter  Procedures  . EKG 12-Lead  . Renal Artery Duplex Bilateral    Meds ordered this encounter  Medications  . metoprolol (LOPRESSOR) 50 MG tablet    Sig: Take 50 mg by mouth 2 (two) times daily.  [name] latanoprost (XALATAN) 0.005 % ophthalmic solution    Sig: Place 1 drop into both eyes at bedtime.  . hydrALAZINE (APRESOLINE) 50 MG tablet    Sig: Take 1 tablet (50 mg total) by mouth 3 (three) times daily.    Dispense:  270 tablet    Refill:  3    Gwendolyn Nishi  Thurmon Fair, MD, [facility] [phone] office [phone] pager

## 2013-09-16 NOTE — Assessment & Plan Note (Signed)
Asymptomatic despite active lifestyle. No changes are made to her antianginal medications. Risk factor modification his mainstay of therapy. She has had gradual progression of vascular disease in multiple arterial territories and aggressive lowering of cholesterol treatment of hypertension as necessary to prevent need for future coronary revascularization

## 2013-09-16 NOTE — Assessment & Plan Note (Signed)
She is known to have a left common iliac artery 50-70% stenosis and occlusion of the right anterior tibial artery, but does not have intermittent claudication and has normal bilateral ABIs

## 2013-09-16 NOTE — Assessment & Plan Note (Signed)
Severe hypertension that is difficult to control despite high doses of multiple blood pressure medications. Increase hydralazine to 50 mg 3 times daily and reevaluate for renal artery stenosis.

## 2013-09-16 NOTE — Assessment & Plan Note (Signed)
She is on maximum dose of rosuvastatin which is her most potent statin. LDL cholesterol control is borderline. Reluctant to add Zetia.

## 2013-09-21 ENCOUNTER — Other Ambulatory Visit: Payer: Medicare Other

## 2013-09-21 DIAGNOSIS — E119 Type 2 diabetes mellitus without complications: Secondary | ICD-10-CM

## 2013-09-21 DIAGNOSIS — E785 Hyperlipidemia, unspecified: Secondary | ICD-10-CM

## 2013-09-22 LAB — COMPREHENSIVE METABOLIC PANEL
Albumin/Globulin Ratio: 1.9 (ref 1.1–2.5)
Albumin: 4.3 g/dL (ref 3.5–4.8)
Alkaline Phosphatase: 52 IU/L (ref 39–117)
BUN: 30 mg/dL — ABNORMAL HIGH (ref 8–27)
CO2: 25 mmol/L (ref 18–29)
Calcium: 9.9 mg/dL (ref 8.6–10.2)
Chloride: 98 mmol/L (ref 97–108)
Creatinine, Ser: 1.28 mg/dL — ABNORMAL HIGH (ref 0.57–1.00)
GFR calc Af Amer: 48 mL/min/{1.73_m2} — ABNORMAL LOW (ref >59)
Globulin, Total: 2.3 g/dL (ref 1.5–4.5)
Glucose: 128 mg/dL — ABNORMAL HIGH (ref 65–99)
Total Bilirubin: 0.5 mg/dL (ref 0.0–1.2)
Total Protein: 6.6 g/dL (ref 6.0–8.5)

## 2013-09-22 LAB — LIPID PANEL
Cholesterol, Total: 174 mg/dL (ref 100–199)
HDL: 53 mg/dL (ref >39)
LDL Calculated: 107 mg/dL — ABNORMAL HIGH (ref 0–99)
Triglycerides: 71 mg/dL (ref 0–149)
VLDL Cholesterol Cal: 14 mg/dL (ref 5–40)

## 2013-09-22 LAB — HEMOGLOBIN A1C
Est. average glucose Bld gHb Est-mCnc: 148 mg/dL
Hgb A1c MFr Bld: 6.8 % — ABNORMAL HIGH (ref 4.8–5.6)

## 2013-09-23 ENCOUNTER — Ambulatory Visit (INDEPENDENT_AMBULATORY_CARE_PROVIDER_SITE_OTHER): Payer: Medicare Other | Admitting: Internal Medicine

## 2013-09-23 ENCOUNTER — Encounter: Payer: Self-pay | Admitting: Internal Medicine

## 2013-09-23 VITALS — BP 136/78 | HR 64 | Temp 98.2°F | Resp 13 | Ht 63.0 in | Wt 151.8 lb

## 2013-09-23 DIAGNOSIS — I10 Essential (primary) hypertension: Secondary | ICD-10-CM

## 2013-09-23 DIAGNOSIS — I2510 Atherosclerotic heart disease of native coronary artery without angina pectoris: Secondary | ICD-10-CM | POA: Diagnosis not present

## 2013-09-23 DIAGNOSIS — E1129 Type 2 diabetes mellitus with other diabetic kidney complication: Secondary | ICD-10-CM | POA: Diagnosis not present

## 2013-09-23 DIAGNOSIS — I4891 Unspecified atrial fibrillation: Secondary | ICD-10-CM

## 2013-09-23 DIAGNOSIS — E785 Hyperlipidemia, unspecified: Secondary | ICD-10-CM | POA: Diagnosis not present

## 2013-09-23 MED ORDER — METOPROLOL TARTRATE 50 MG PO TABS: ORAL_TABLET | ORAL | Status: DC

## 2013-09-23 NOTE — Progress Notes (Signed)
Subjective:    Patient ID: Kendra Todd, female    DOB: [birth year], 72 y.o.   MRN: [id number]   Chief Complaint  Patient presents with  . Medical Managment of Chronic Issues    HPI Pain in the left side of here back for 2 weeks. Feels swollen. Using Tylenol #3. Seems to help. Denies dyspnea. No pain with deep breathing.  Has test for bone density tomorrow.  Type II or unspecified type diabetes mellitus with renal manifestations, not stated as uncontrolled(250.40): controlled. Home glucose  About 118-125  Atrial fibrillation: chronic and rate controlled  HLD (hyperlipidemia): controlled  HTN (hypertension): controlled. Recent elevation at office of Dr. Royann Shivers. He increased hydralazine. He is concerned about the lability. She is scheduled for US of the renal arteries and the aorta.  CAD (coronary artery disease): 1993 PTCA to circumflex.  2002 2 BMS stents to ostial, S/P CABG x 3 12/04/12: no angina    Current Outpatient Prescriptions on File Prior to Visit  Medication Sig Dispense Refill  . acetaminophen-codeine (TYLENOL #3) 300-30 MG per tablet TAKE 1 TABLET BY MOUTH EVERY 4 HOURS AS NEEDED FOR PAIN  360 tablet  0  . aspirin EC 325 MG EC tablet Take 1 tablet (325 mg total) by mouth daily.  30 tablet    . latanoprost (XALATAN) 0.005 % ophthalmic solution Place 1 drop into both eyes at bedtime.      . metformin (FORTAMET) 1000 MG (OSM) 24 hr tablet TAKE 1 TABLET BEFORE BREAKFAST AND 1 TABLET BEFORE SUPPER TO CONTROL DIABETES  180 tablet  0  . NITROGLYCERIN PO Take 0.4 mg by mouth as needed. For chest pain.      . rosuvastatin (CRESTOR) 40 MG tablet One daily to lower cholessterol  90 tablet  3  . valsartan-hydrochlorothiazide (DIOVAN-HCT) 320-25 MG per tablet Take 1 tablet by mouth daily. For blood pressure.       No current facility-administered medications on file prior to visit.    Review of Systems  Constitutional: Negative.   HENT: Negative.   Eyes: Negative.    Respiratory: Negative.   Cardiovascular: Negative for chest pain, palpitations and leg swelling.  Endocrine: Negative for polydipsia, polyphagia and polyuria.       History diabetes  Genitourinary: Positive for urgency and frequency. Negative for flank pain.  Musculoskeletal: Positive for arthralgias.  Skin: Negative.   Neurological: Negative.   Hematological: Negative.   Psychiatric/Behavioral: Negative.        Objective:BP 136/78  Pulse 64  Temp(Src) 98.2 F (36.8 C) (Oral)  Resp 13  Ht 5\' 3"  (1.6 m)  Wt 151 lb 12.8 oz (68.856 kg)  BMI 26.90 kg/m2    Physical Exam  Constitutional: She is oriented to person, place, and time. She appears well-developed and well-nourished. No distress.  HENT:  Head: Normocephalic.  Nose: Nose normal.  Upper and lower dentures present.  Eyes: Conjunctivae are normal. Pupils are equal, round, and reactive to light.  Wears prescription lenses. Right cataract.  Neck: Normal range of motion. Neck supple. No JVD present. No tracheal deviation present. No thyromegaly present.  Cardiovascular: Normal rate, regular rhythm and normal heart sounds.  Exam reveals no gallop and no friction rub.   No murmur heard. Diminished dorsalis pedis pulses bilaterally and diminished posterior tibial pulses bilaterally.  Pulmonary/Chest: Effort normal and breath sounds normal. No respiratory distress. She has no wheezes. She has no rales. She exhibits no tenderness.  Abdominal: Soft. Bowel sounds are normal. She exhibits  no distension and no mass. There is no tenderness.  Musculoskeletal: She exhibits no edema and no tenderness.  Lymphadenopathy:    She has no cervical adenopathy.  Neurological: She is alert and oriented to person, place, and time. She has normal reflexes. No cranial nerve deficit. Coordination normal.  Skin: Skin is warm and dry. No rash noted. No erythema. No pallor.  Psychiatric: She has a normal mood and affect. Her behavior is normal. Judgment  normal.     Appointment on 09/21/2013  Component Date Value Range Status  . Hemoglobin A1C 09/21/2013 6.8* 4.8 - 5.6 % Final   Comment:          Increased risk for diabetes: 5.7 - 6.4                                   Diabetes: >6.4                                   Glycemic control for adults with diabetes: <7.0  . Estimated average glucose 09/21/2013 148   Final  . Glucose 09/21/2013 128* 65 - 99 mg/dL Final  . BUN 16/08/9603 30* 8 - 27 mg/dL Final  . Creatinine, Ser 09/21/2013 1.28* 0.57 - 1.00 mg/dL Final  . GFR calc non Af Amer 09/21/2013 41* >59 mL/min/1.73 Final  . GFR calc Af Amer 09/21/2013 48* >59 mL/min/1.73 Final  . BUN/Creatinine Ratio 09/21/2013 23  11 - 26 Final  . Sodium 09/21/2013 139  134 - 144 mmol/L Final  . Potassium 09/21/2013 4.6  3.5 - 5.2 mmol/L Final  . Chloride 09/21/2013 98  97 - 108 mmol/L Final  . CO2 09/21/2013 25  18 - 29 mmol/L Final  . Calcium 09/21/2013 9.9  8.6 - 10.2 mg/dL Final  . Total Protein 09/21/2013 6.6  6.0 - 8.5 g/dL Final  . Albumin 54/07/8118 4.3  3.5 - 4.8 g/dL Final  . Globulin, Total 09/21/2013 2.3  1.5 - 4.5 g/dL Final  . Albumin/Globulin Ratio 09/21/2013 1.9  1.1 - 2.5 Final  . Total Bilirubin 09/21/2013 0.5  0.0 - 1.2 mg/dL Final  . Alkaline Phosphatase 09/21/2013 52  39 - 117 IU/L Final  . AST 09/21/2013 24  0 - 40 IU/L Final  . ALT 09/21/2013 13  0 - 32 IU/L Final  . Cholesterol, Total 09/21/2013 174  100 - 199 mg/dL Final  . Triglycerides 09/21/2013 71  0 - 149 mg/dL Final  . HDL 14/78/2956 53  >39 mg/dL Final   Comment: According to ATP-III Guidelines, HDL-C >59 mg/dL is considered a                          negative risk factor for CHD.  [name] VLDL Cholesterol Cal 09/21/2013 14  5 - 40 mg/dL Final  . LDL Calculated 09/21/2013 213* 0 - 99 mg/dL Final  . Chol/HDL Ratio 09/21/2013 3.3  0.0 - 4.4 ratio units Final   Comment:                                   T. Chol/HDL Ratio  Men  Women                                                        1/2 Avg.Risk  3.4    3.3                                                            Avg.Risk  5.0    4.4                                                         2X Avg.Risk  9.6    7.1                                                         3X Avg.Risk 23.4   11.0        Assessment & Plan:  1. Atrial fibrillation Rate controlled  2. HLD (hyperlipidemia) controlled - Lipid panel; Future  3. HTN (hypertension) controlled - Comprehensive metabolic panel; Future  4. CAD (coronary artery disease): 1993 PTCA to circumflex.  2002 2 BMS stents to ostial, S/P CABG x 3 12/04/12 stable  5. Type II or unspecified type diabetes mellitus with renal manifestations, not stated as uncontrolled(250.40) controlled - Hemoglobin A1c; Future - Comprehensive metabolic panel; Future

## 2013-09-23 NOTE — Patient Instructions (Signed)
continue current medicatoins

## 2013-09-24 DIAGNOSIS — E2839 Other primary ovarian failure: Secondary | ICD-10-CM | POA: Diagnosis not present

## 2013-09-27 ENCOUNTER — Other Ambulatory Visit: Payer: Self-pay | Admitting: Internal Medicine

## 2013-09-28 ENCOUNTER — Ambulatory Visit (HOSPITAL_COMMUNITY)
Admission: RE | Admit: 2013-09-28 | Discharge: 2013-09-28 | Disposition: A | Discharge status: Home / Self Care | Payer: Medicare Other | Source: Ambulatory Visit | Attending: Cardiology | Admitting: Cardiology

## 2013-09-28 DIAGNOSIS — I150 Renovascular hypertension: Secondary | ICD-10-CM

## 2013-09-28 DIAGNOSIS — I10 Essential (primary) hypertension: Secondary | ICD-10-CM

## 2013-09-28 NOTE — Progress Notes (Signed)
Renal Duplex Completed. Afreen Siebels, BS, RDMS, RVT  

## 2013-10-12 ENCOUNTER — Other Ambulatory Visit: Payer: Self-pay | Admitting: *Deleted

## 2013-10-12 MED ORDER — ROSUVASTATIN CALCIUM 40 MG PO TABS: ORAL_TABLET | ORAL | Status: DC

## 2013-10-22 ENCOUNTER — Encounter: Payer: Self-pay | Admitting: Cardiovascular Disease

## 2013-10-22 ENCOUNTER — Ambulatory Visit (INDEPENDENT_AMBULATORY_CARE_PROVIDER_SITE_OTHER): Payer: Medicare Other | Admitting: Cardiovascular Disease

## 2013-10-22 VITALS — BP 138/90 | HR 64 | Ht 62.5 in | Wt 153.7 lb

## 2013-10-22 DIAGNOSIS — R5383 Other fatigue: Secondary | ICD-10-CM

## 2013-10-22 DIAGNOSIS — R5381 Other malaise: Secondary | ICD-10-CM

## 2013-10-22 DIAGNOSIS — I701 Atherosclerosis of renal artery: Secondary | ICD-10-CM

## 2013-10-22 DIAGNOSIS — Z01818 Encounter for other preprocedural examination: Secondary | ICD-10-CM

## 2013-10-22 DIAGNOSIS — D689 Coagulation defect, unspecified: Secondary | ICD-10-CM | POA: Diagnosis not present

## 2013-10-22 DIAGNOSIS — Z79899 Other long term (current) drug therapy: Secondary | ICD-10-CM | POA: Diagnosis not present

## 2013-10-22 NOTE — Assessment & Plan Note (Signed)
Patient has persistent hypertension on 4 antihypertensive medications with recent recent Doppler studies performed in our office on 09/28/13 and suggested a high-grade bilateral renal artery stenosis. Patient will be admitted for angiography and potential percutaneous intervention for treatment of renal vascular hypertension.

## 2013-10-22 NOTE — Patient Instructions (Addendum)
Dr. Allyson Sabal has order a renal angiogram to be done at [facility].  This procedure is going to look at the bloodflow in your kidneys.  If Dr. Allyson Sabal is able to open up the arteries, you will have to spend one night in the hospital.  If he is not able to open the arteries, you will be able to go home that same day.    After the procedure, you will not be allowed to drive for 3 days or push, pull, or lift anything greater than 10 lbs for one week.    You will be required to have bloodwork prior to your procedure.  Our scheduler will advise you on when these items need to be done.

## 2013-10-22 NOTE — Progress Notes (Signed)
10/22/2013 Kendra Todd   08-29-1941  [id number]  Primary Physician GREEN, [name], MD Primary Cardiologist: Runell Gess MD Roseanne Reno   HPI:  CAD status post CABG, hypertension, paroxysmal atrial fibrillation, hyperlipidemia, recurrent carotid stenosis, PAD  Complains of pain in her left upper back, which feels swollen for last 2 weeks. No chest pain, SOB or dizziness. Occasional mild bilateral ankle edema. One episode of later blood pressure 202/104. She took an extra Diovan-HCt and rested and BP went back to a normal range after 2 days.  She is active, enjoys family and engaged socially. She seems to be much better and has recovered psychological from the double insult of losing her husband and requiring bypass surgery within the last 2 years.  Carotid duplex ultrasound shows progression of stenosis in the right internal carotid artery at the site of previous endarterectomy, now 60% to 79% stenosis. Possible need for right carotid stent has been discussed. She initially had bilateral carotid endarterectomy in 2001.  She has a history of preserved systolic function but significant diastolic dysfunction with pseudonormalization, that was present even before her coronary problems were fully established. She has severe systemic hypertension that has required numerous medications for adequate control.  As a long-standing history of infrequent episodes of paroxysmal atrial fibrillation that became more of a problem in her immediate postoperative course after bypass surgery. Treatment with amiodarone was poorly tolerated with severe gastrointestinal side effects that lead to a 20 pound weight loss. She has recovered some of the lost weight. She was treated with anticoagulation with Xarelto, which also had to be stopped because of gastrointestinal bleeding.  She has difficult to control hypertension on 4 antihypertensive medications with recent renal Doppler studies performed  09/28/13 suggesting high-grade bilateral renal artery stenosis. She was referred to me for consideration of percutaneous catheterization for treatment of renal vascular hypertension.    Current Outpatient Prescriptions  Medication Sig Dispense Refill  . acetaminophen-codeine (TYLENOL #3) 300-30 MG per tablet TAKE 1 TABLET BY MOUTH EVERY 4 HOURS AS NEEDED FOR PAIN  360 tablet  0  . aspirin EC 325 MG EC tablet Take 1 tablet (325 mg total) by mouth daily.  30 tablet    . hydrALAZINE (APRESOLINE) 25 MG tablet Take one tablet by mouth three times daily      . latanoprost (XALATAN) 0.005 % ophthalmic solution Place 1 drop into both eyes at bedtime.      . metformin (FORTAMET) 1000 MG (OSM) 24 hr tablet TAKE 1 TABLET BEFORE BREAKFAST AND 1 TABLET BEFORE SUPPER TO CONTROL DIABETES  180 tablet  3  . metoprolol (LOPRESSOR) 50 MG tablet Take two tablets by mouth twice daily to control blood pressure  180 tablet  3  . NITROGLYCERIN PO Take 0.4 mg by mouth as needed. For chest pain.      . rosuvastatin (CRESTOR) 40 MG tablet One daily to lower cholessterol  90 tablet  3  . valsartan-hydrochlorothiazide (DIOVAN-HCT) 320-25 MG per tablet Take 1 tablet by mouth daily. For blood pressure.       No current facility-administered medications for this visit.    Allergies  Allergen Reactions  . Iron Hives  . Norvasc [Amlodipine Besylate]   . Promethazine Other (See Comments)    hallucinations  . Vicodin [Hydrocodone-Acetaminophen]   . Vioxx [Rofecoxib]     History   Social History  . Marital Status: Married    Spouse Name: N/A    Number of Children:  N/A  . Years of Education: N/A   Occupational History  . Not on file.   Social History Main Topics  . Smoking status: Former Smoker -- 0.30 packs/day for 57 years    Types: Cigarettes    Quit date: 11/19/2012  . Smokeless tobacco: Never Used     Comment: pt states she is working on it but husband just passed  . Alcohol Use: 0.0 oz/week    1-2  Glasses of wine per week  . Drug Use: No  . Sexual Activity: Not on file   Other Topics Concern  . Not on file   Social History Narrative  . No narrative on file     Review of Systems: General: negative for chills, fever, night sweats or weight changes.  Cardiovascular: negative for chest pain, dyspnea on exertion, edema, orthopnea, palpitations, paroxysmal nocturnal dyspnea or shortness of breath Dermatological: negative for rash Respiratory: negative for cough or wheezing Urologic: negative for hematuria Abdominal: negative for nausea, vomiting, diarrhea, bright red blood per rectum, melena, or hematemesis Neurologic: negative for visual changes, syncope, or dizziness All other systems reviewed and are otherwise negative except as noted above.    Blood pressure 138/90, pulse 64, height 5' 2.5" (1.588 m), weight 153 lb 11.2 oz (69.718 kg).  General appearance: alert and no distress Neck: no adenopathy, no carotid bruit, no JVD, supple, symmetrical, trachea midline and thyroid not enlarged, symmetric, no tenderness/mass/nodules Lungs: clear to auscultation bilaterally Heart: regular rate and rhythm, S1, S2 normal, no murmur, click, rub or gallop Extremities: extremities normal, atraumatic, no cyanosis or edema  EKG not performed today  ASSESSMENT AND PLAN:   Renal artery stenosis Patient has persistent hypertension on 4 antihypertensive medications with recent recent Doppler studies performed in our office on 09/28/13 and suggested a high-grade bilateral renal artery stenosis. Patient will be admitted for angiography and potential percutaneous intervention for treatment of renal vascular hypertension.      Runell Gess MD FACP,FACC,FAHA, [facility] 10/22/2013 12:46 PM

## 2013-10-23 ENCOUNTER — Encounter: Payer: Self-pay | Admitting: Cardiovascular Disease

## 2013-11-10 DIAGNOSIS — H4011X0 Primary open-angle glaucoma, stage unspecified: Secondary | ICD-10-CM | POA: Diagnosis not present

## 2013-11-12 ENCOUNTER — Encounter: Payer: Self-pay | Admitting: Internal Medicine

## 2013-11-12 ENCOUNTER — Ambulatory Visit (INDEPENDENT_AMBULATORY_CARE_PROVIDER_SITE_OTHER): Payer: Medicare Other | Admitting: Internal Medicine

## 2013-11-12 ENCOUNTER — Ambulatory Visit
Admission: RE | Admit: 2013-11-12 | Discharge: 2013-11-12 | Disposition: A | Discharge status: Home / Self Care | Payer: Medicare Other | Source: Ambulatory Visit | Attending: Internal Medicine | Admitting: Internal Medicine

## 2013-11-12 VITALS — BP 164/80 | HR 68 | Temp 99.0°F | Resp 16 | Wt 151.4 lb

## 2013-11-12 DIAGNOSIS — R1084 Generalized abdominal pain: Secondary | ICD-10-CM | POA: Diagnosis not present

## 2013-11-12 DIAGNOSIS — K5900 Constipation, unspecified: Secondary | ICD-10-CM | POA: Diagnosis not present

## 2013-11-12 NOTE — Progress Notes (Signed)
Patient ID: Kendra Todd, female   DOB: 09-27-1941, 73 y.o.   MRN: [id number]   Location:  [facility]   Allergies  Allergen Reactions  . Iron Hives  . Norvasc [Amlodipine Besylate]   . Promethazine Other (See Comments)    hallucinations  . Vicodin [Hydrocodone-Acetaminophen]   . Vioxx [Rofecoxib]     Chief Complaint  Patient presents with  . Acute Visit    abdominal cramps & no bowel movement in 7 days, taken bottle Magnesium Citrate, Dulcolax with no relief.  [name] other    declines Flu vaccines, no refills needed.    HPI: Patient is a 73 y.o. black female seen in the office today for constipation and no bm in 6 days.  Was taking more tylenol with codeine due to pain--for her arthritis. No bm in 6 days.  Normally moves bowels daily to every other day.  Last BM was Friday so actually 6 days.  Having cramps.  Has had some gas come out, but no actually bm.  Tried mag citrate and used dulcolax stool softener.   BP poorly controlled--for stent placement in kidney later this month.  On three meds.  Is feeling full quickly due to not going for so long.    Review of Systems:  Review of Systems  Constitutional: Positive for malaise/fatigue. Negative for fever and chills.  Respiratory: Negative for shortness of breath.   Cardiovascular: Negative for chest pain.  Gastrointestinal: Positive for abdominal pain and constipation. Negative for diarrhea, blood in stool and melena.  Genitourinary: Negative for dysuria.  Musculoskeletal: Positive for joint pain.  Neurological: Negative for weakness.    Past Medical History  Diagnosis Date  . Arthritis   . Atrial fibrillation 02/21/2011  . Hypertension   . Hyperlipidemia   . Myocardial infarction   . Diabetes mellitus age 72  . CAD (coronary artery disease)     prior coronary stenting  . Stroke 2001  . Peripheral vascular disease   . Carotid artery disease 2001    s/p Bilateral CEA, after CVA  .  TR (tricuspid regurgitation), severe 12/02/12 for repair with CABG 12/03/2012  . Pulmonary HTN 12/03/2012  . Dyspnea 03/13/2006    Myoview - EF 76%; mild ischemia in apical lateral region, less severe than previous study in 2002  . H/O endarterectomy 09/11/2012    patent L and R carotid sites, R 60-79% restenosed ICA; L <40% restenosed ICA  . Stenosis of artery 06/03/2012    doppler- most distal aspect of abd aorta no aneurysmal dilation; bilateral ABIs - mild L arterial insufficiency, L CIA narrowing w/ 50-69% diameter reduction; L and R SFA both w/ 0-49% diameter reductions  . H/O endarterectomy 01/08/2012    doppler - R distal common carotid/proximal ICA stenosed 80-99%, L 0-39%  . Decreased pedal pulses 06/04/2011    doppler - bilateral ABIs no evidence of insufficiency; L CIA slightly elevated velocities 20-30% diameter reduction;   . Limb pain 05/02/2011    doppler - no evidence of thrombus of thrombophlebitis  . Diabetes   . H/O carotid endarterectomy 03/06/2012    CT angio -high grade stenosis of proximal R internal carotid w/ lg posterior ulceration or dissection flap; near occlusive stenosis at origin of nondominantproximal L vertebral artery; <50% stenosis of proximal R vertebral and proximal L subclavian artery  . Urinary tract infection, site not specified   . Tobacco use disorder   . Unspecified cataract   . Myalgia  and myositis, unspecified   . Cervicalgia   . Acute upper respiratory infections of unspecified site   . Tobacco use disorder   . Peripheral vascular disease, unspecified   . Atrial fibrillation   . Phlebitis and thrombophlebitis of superficial vessels of lower extremities   . Edema   . Long term (current) use of anticoagulants   . Loss of weight   . Abdominal pain, epigastric   . Atrial fibrillation   . Benign paroxysmal positional vertigo   . Type II or unspecified type diabetes mellitus with peripheral circulatory disorders, uncontrolled(250.72)   . Unspecified  vitamin D deficiency   . Other manifestations of vitamin A deficiency   . Type II or unspecified type diabetes mellitus with peripheral circulatory disorders, not stated as uncontrolled(250.70)   . Primary localized osteoarthrosis, hand   . Abdominal pain, epigastric   . Nontoxic uninodular goiter   . Nonspecific abnormal results of liver function study   . Candidiasis of vulva and vagina   . Routine gynecological examination   . Unspecified disorder of iris and ciliary body   . Spinal stenosis, unspecified region other than cervical   . Other malaise and fatigue   . Pain in joint, pelvic region and thigh   . Cervicalgia   . Other and unspecified hyperlipidemia   . Edema   . Coronary atherosclerosis of unspecified type of vessel, native or graft   . Pain in limb   . Osteoarthrosis, unspecified whether generalized or localized, unspecified site   . Occlusion and stenosis of carotid artery without mention of cerebral infarction   . Lumbago   . Myalgia and myositis, unspecified   . Renal artery stenosis     Past Surgical History  Procedure Laterality Date  . Carotid endarterectomy  2001    Bilateral  . Cholecystectomy    . Kidney stone surgery      Removal  . Back surgery  [phone]    three previous surgeries  . Angioplasty  1984 and 1982  . Abdominal hysterectomy  1975  . Percutaneous coronary stent intervention (pci-s)  2002    2 BMS in ostial/Prox & mid RCA (mid 2.5 mm 20x  mm, & 2.75 mm x  8 mm ostial)  . Coronary artery bypass graft  12/04/2012    Procedure: CORONARY ARTERY BYPASS GRAFTING (CABG);  Surgeon: Ivin Poot, MD;  Location: [place];  Service: Open Heart Surgery;  Laterality: N/A;  . Intraoperative transesophageal echocardiogram  12/04/2012    Procedure: INTRAOPERATIVE TRANSESOPHAGEAL ECHOCARDIOGRAM;  Surgeon: Ivin Poot, MD;  Location: [place];  Service: Open Heart Surgery;  Laterality: N/A;  . Appendectomy  1954  . Tee with cardioversion  01/23/2011     EF 99991111; grade 2 diastolic dysfunction, elevated mean LA filling pressure, RV systolic pressure increased consistent w/ mod pulmonary hypertension  . Arch aortogram  03/30/2011    40% recurrent stenosis at origin of R proximal carotid patch, shelf like recurrent stenosis w/in midsection of patch that does not appear to be flow limiting; normal non-stenosed great vessel origins    Social History:   reports that she quit smoking about a year ago. Her smoking use included Cigarettes. She has a 17.1 pack-year smoking history. She has never used smokeless tobacco. She reports that she drinks alcohol. She reports that she does not use illicit drugs.  Family History  Problem Relation Age of Onset  . Other Mother     CVA  . Diabetes Mother   .  Heart disease Mother     Heart disease before age 37  . Heart attack Mother   . Heart disease Father     Heart Disease before age 58  . Heart attack Father   . Heart disease Sister     Amputation  . Diabetes Sister   . Heart disease Brother     Heart disease before age 62  . Diabetes Brother   . Stroke Brother   . Stroke Brother   . Heart disease Brother   . Stroke Brother   . Heart disease Brother   . Heart disease Sister   . Heart disease Sister   . Diabetes Other   . Cancer Other     breast  . Heart disease Other     cad  . Other Other     cva  . Diabetes Daughter   . Cancer Sister     Medications: Patient's Medications  New Prescriptions   No medications on file  Previous Medications   ACETAMINOPHEN-CODEINE (TYLENOL #3) 300-30 MG PER TABLET    TAKE 1 TABLET BY MOUTH EVERY 4 HOURS AS NEEDED FOR PAIN   ASPIRIN EC 325 MG EC TABLET    Take 1 tablet (325 mg total) by mouth daily.   HYDRALAZINE (APRESOLINE) 25 MG TABLET    Take one tablet by mouth three times daily   LATANOPROST (XALATAN) 0.005 % OPHTHALMIC SOLUTION    Place 1 drop into both eyes at bedtime.   METFORMIN (FORTAMET) 1000 MG (OSM) 24 HR TABLET    TAKE 1 TABLET BEFORE  BREAKFAST AND 1 TABLET BEFORE SUPPER TO CONTROL DIABETES   METOPROLOL (LOPRESSOR) 50 MG TABLET    Take two tablets by mouth twice daily to control blood pressure   NITROGLYCERIN PO    Take 0.4 mg by mouth as needed. For chest pain.   ROSUVASTATIN (CRESTOR) 40 MG TABLET    One daily to lower cholessterol   VALSARTAN-HYDROCHLOROTHIAZIDE (DIOVAN-HCT) 320-25 MG PER TABLET    Take 1 tablet by mouth daily. For blood pressure.  Modified Medications   No medications on file  Discontinued Medications   No medications on file     Physical Exam: Filed Vitals:   11/12/13 1050  BP: 164/80  Pulse: 68  Temp: 99 F (37.2 C)  TempSrc: Oral  Resp: 16  Weight: 151 lb 6.4 oz (68.675 kg)  SpO2: 94%  Physical Exam  Constitutional: She is oriented to person, place, and time. She appears well-developed and well-nourished. No distress.  Eyes: Scleral icterus is present.  Cardiovascular: Normal rate, regular rhythm, normal heart sounds and intact distal pulses.   Pulmonary/Chest: Effort normal and breath sounds normal. No respiratory distress.  Abdominal: Soft. Bowel sounds are normal. She exhibits distension. She exhibits no mass. There is tenderness.  Feels full, palpable stool in bowel;  Rectal exam with small amts of hard stool in rectal vault, but not impacted  Neurological: She is alert and oriented to person, place, and time.  Psychiatric: She has a normal mood and affect.    Labs reviewed: Basic Metabolic Panel:  Recent Labs  11/28/12 0239  12/04/12 1915 12/05/12 0320 12/05/12 1600  03/02/13 1149 06/01/13 0853 09/21/13 0940  NA  --   < >  --  140  --   < > 140 139 139  K  --   < >  --  3.9  --   < > 5.3* 4.4 4.6  CL  --   < >  --  109  --   < > 103 100 98  CO2  --   < >  --  23  --   < > 25 26 25   GLUCOSE  --   < >  --  120*  --   < > 110* 100* 128*  BUN  --   < >  --  11  --   < > 18 28* 30*  CREATININE  --   < > 0.67 0.77 1.04  < > 1.23* 1.03* 1.28*  CALCIUM  --   < >  --  8.2*   --   < > 9.9 9.9 9.9  MG  --   --  3.2* 2.8* 2.8*  --   --   --   --   TSH 2.654  --   --   --   --   --   --   --   --   < > = values in this interval not displayed. Liver Function Tests:  Recent Labs  11/28/12 0920 12/03/12 1534 03/02/13 1149 09/21/13 0940  AST 24 15 22 24   ALT 8 11 12 13   ALKPHOS 50 57 72 52  BILITOT 0.3 0.4 0.4 0.5  PROT 6.3 6.6 6.3 6.6  ALBUMIN 3.3* 3.1*  --   --   CBC:  Recent Labs  12/09/12 0535 12/10/12 0518 12/11/12 0540  WBC 4.6 5.8 7.3  HGB 9.5* 9.4* 9.6*  HCT 29.7* 29.4* 29.8*  MCV 82.0 81.7 81.0  PLT 222 264 289   Lipid Panel:  Recent Labs  11/28/12 0239 03/02/13 1149 06/01/13 0853 09/21/13 0940  CHOL 160  --   --   --   HDL 49 56 57 53  LDLCALC 99 198* 100* 107*  TRIG 59 62 71 71  CHOLHDL 3.3 4.8* 3.0 3.3   Lab Results  Component Value Date   HGBA1C 6.8* 09/21/2013   Assessment/Plan 1. Acute constipation -rule out small bowel obstruction -no evidence of impaction on rectal exam - DG Abd 1 View  2. Generalized abdominal cramps -due to constipation  Labs/tests ordered: KUB with call report to my cell phone--I will give her instruction for outpatient mgt or to go to hospital if obstructed further up Next appt:  As scheduled

## 2013-11-17 ENCOUNTER — Encounter (HOSPITAL_COMMUNITY): Payer: Self-pay | Admitting: Respiratory Therapy

## 2013-11-24 DIAGNOSIS — R5381 Other malaise: Secondary | ICD-10-CM | POA: Diagnosis not present

## 2013-11-24 DIAGNOSIS — D689 Coagulation defect, unspecified: Secondary | ICD-10-CM | POA: Diagnosis not present

## 2013-11-24 DIAGNOSIS — R5383 Other fatigue: Secondary | ICD-10-CM | POA: Diagnosis not present

## 2013-11-24 DIAGNOSIS — Z79899 Other long term (current) drug therapy: Secondary | ICD-10-CM | POA: Diagnosis not present

## 2013-11-24 LAB — APTT: aPTT: 33 seconds (ref 24–37)

## 2013-11-24 LAB — PROTIME-INR
INR: 1.18 (ref <1.50)
Prothrombin Time: 14.9 seconds (ref 11.6–15.2)

## 2013-11-25 LAB — CBC
HCT: 30.5 % — ABNORMAL LOW (ref 36.0–46.0)
Hemoglobin: 9.4 g/dL — ABNORMAL LOW (ref 12.0–15.0)
MCH: 22.6 pg — ABNORMAL LOW (ref 26.0–34.0)
MCHC: 30.8 g/dL (ref 30.0–36.0)
MCV: 73.3 fL — ABNORMAL LOW (ref 78.0–100.0)
Platelets: 452 10*3/uL — ABNORMAL HIGH (ref 150–400)
RBC: 4.16 MIL/uL (ref 3.87–5.11)
RDW: 16.6 % — ABNORMAL HIGH (ref 11.5–15.5)
WBC: 5.3 10*3/uL (ref 4.0–10.5)

## 2013-11-25 LAB — TSH: TSH: 1.436 u[IU]/mL (ref 0.350–4.500)

## 2013-11-30 ENCOUNTER — Encounter (HOSPITAL_COMMUNITY)
Admission: RE | Disposition: A | Discharge status: Home / Self Care | Payer: Self-pay | Source: Ambulatory Visit | Attending: Cardiovascular Disease

## 2013-11-30 ENCOUNTER — Ambulatory Visit (HOSPITAL_COMMUNITY)
Admission: RE | Admit: 2013-11-30 | Discharge: 2013-11-30 | Disposition: A | Discharge status: Home / Self Care | Payer: Medicare Other | Source: Ambulatory Visit | Attending: Cardiovascular Disease | Admitting: Cardiovascular Disease

## 2013-11-30 DIAGNOSIS — I739 Peripheral vascular disease, unspecified: Secondary | ICD-10-CM | POA: Insufficient documentation

## 2013-11-30 DIAGNOSIS — I708 Atherosclerosis of other arteries: Secondary | ICD-10-CM | POA: Insufficient documentation

## 2013-11-30 DIAGNOSIS — I10 Essential (primary) hypertension: Secondary | ICD-10-CM | POA: Insufficient documentation

## 2013-11-30 DIAGNOSIS — R5381 Other malaise: Secondary | ICD-10-CM | POA: Insufficient documentation

## 2013-11-30 DIAGNOSIS — Z01818 Encounter for other preprocedural examination: Secondary | ICD-10-CM

## 2013-11-30 DIAGNOSIS — M1990 Unspecified osteoarthritis, unspecified site: Secondary | ICD-10-CM | POA: Diagnosis not present

## 2013-11-30 DIAGNOSIS — E785 Hyperlipidemia, unspecified: Secondary | ICD-10-CM | POA: Insufficient documentation

## 2013-11-30 DIAGNOSIS — I6529 Occlusion and stenosis of unspecified carotid artery: Secondary | ICD-10-CM | POA: Diagnosis not present

## 2013-11-30 DIAGNOSIS — I701 Atherosclerosis of renal artery: Secondary | ICD-10-CM | POA: Insufficient documentation

## 2013-11-30 DIAGNOSIS — E1159 Type 2 diabetes mellitus with other circulatory complications: Secondary | ICD-10-CM | POA: Insufficient documentation

## 2013-11-30 DIAGNOSIS — IMO0001 Reserved for inherently not codable concepts without codable children: Secondary | ICD-10-CM | POA: Diagnosis not present

## 2013-11-30 DIAGNOSIS — I798 Other disorders of arteries, arterioles and capillaries in diseases classified elsewhere: Secondary | ICD-10-CM | POA: Insufficient documentation

## 2013-11-30 DIAGNOSIS — I252 Old myocardial infarction: Secondary | ICD-10-CM | POA: Insufficient documentation

## 2013-11-30 DIAGNOSIS — I70219 Atherosclerosis of native arteries of extremities with intermittent claudication, unspecified extremity: Secondary | ICD-10-CM

## 2013-11-30 DIAGNOSIS — R5383 Other fatigue: Secondary | ICD-10-CM | POA: Diagnosis not present

## 2013-11-30 DIAGNOSIS — I2510 Atherosclerotic heart disease of native coronary artery without angina pectoris: Secondary | ICD-10-CM | POA: Diagnosis not present

## 2013-11-30 DIAGNOSIS — I4891 Unspecified atrial fibrillation: Secondary | ICD-10-CM | POA: Insufficient documentation

## 2013-11-30 DIAGNOSIS — I079 Rheumatic tricuspid valve disease, unspecified: Secondary | ICD-10-CM | POA: Insufficient documentation

## 2013-11-30 HISTORY — PX: RENAL ANGIOGRAM: SHX5509

## 2013-11-30 LAB — BASIC METABOLIC PANEL
BUN: 23 mg/dL (ref 6–23)
CALCIUM: 9.4 mg/dL (ref 8.4–10.5)
CO2: 25 mEq/L (ref 19–32)
Chloride: 99 mEq/L (ref 96–112)
Creatinine, Ser: 1.1 mg/dL (ref 0.50–1.10)
GFR, EST AFRICAN AMERICAN: 56 mL/min — AB (ref >90)
GFR, EST NON AFRICAN AMERICAN: 48 mL/min — AB (ref >90)
Glucose, Bld: 123 mg/dL — ABNORMAL HIGH (ref 70–99)
Potassium: 4 mEq/L (ref 3.7–5.3)
SODIUM: 139 meq/L (ref 137–147)

## 2013-11-30 LAB — GLUCOSE, CAPILLARY
GLUCOSE-CAPILLARY: 114 mg/dL — AB (ref 70–99)
Glucose-Capillary: 105 mg/dL — ABNORMAL HIGH (ref 70–99)
Glucose-Capillary: 166 mg/dL — ABNORMAL HIGH (ref 70–99)

## 2013-11-30 SURGERY — RENAL ANGIOGRAM
Anesthesia: LOCAL

## 2013-11-30 MED ORDER — ONDANSETRON HCL 4 MG/2ML IJ SOLN: 4.0000 mg | Freq: Four times a day (QID) | INTRAMUSCULAR | Status: DC | PRN

## 2013-11-30 MED ORDER — ASPIRIN 81 MG PO CHEW
81.0000 mg | CHEWABLE_TABLET | ORAL | Status: AC
  Administered 2013-11-30: 81 mg via ORAL

## 2013-11-30 MED ORDER — HYDRALAZINE HCL 20 MG/ML IJ SOLN
10.0000 mg | INTRAMUSCULAR | Status: DC
  Administered 2013-11-30: 10 mg via INTRAVENOUS
  Filled 2013-11-30: qty 1

## 2013-11-30 MED ORDER — ASPIRIN 81 MG PO CHEW
CHEWABLE_TABLET | ORAL | Status: AC
  Filled 2013-11-30: qty 1

## 2013-11-30 MED ORDER — MIDAZOLAM HCL 2 MG/2ML IJ SOLN
INTRAMUSCULAR | Status: AC
  Filled 2013-11-30: qty 2

## 2013-11-30 MED ORDER — SODIUM CHLORIDE 0.9 % IV SOLN: INTRAVENOUS | Status: AC

## 2013-11-30 MED ORDER — FENTANYL CITRATE 0.05 MG/ML IJ SOLN
INTRAMUSCULAR | Status: AC
Start: 2013-11-30 — End: 2013-11-30
  Filled 2013-11-30: qty 2

## 2013-11-30 MED ORDER — ACETAMINOPHEN 325 MG PO TABS: 650.0000 mg | ORAL_TABLET | ORAL | Status: DC | PRN

## 2013-11-30 MED ORDER — ASPIRIN EC 325 MG PO TBEC: 325.0000 mg | DELAYED_RELEASE_TABLET | Freq: Every day | ORAL | Status: DC

## 2013-11-30 MED ORDER — HEPARIN (PORCINE) IN NACL 2-0.9 UNIT/ML-% IJ SOLN
INTRAMUSCULAR | Status: AC
  Filled 2013-11-30: qty 1000

## 2013-11-30 MED ORDER — NITROGLYCERIN 0.2 MG/ML ON CALL CATH LAB
INTRAVENOUS | Status: AC
Start: 2013-11-30 — End: 2013-11-30
  Filled 2013-11-30: qty 1

## 2013-11-30 MED ORDER — DIAZEPAM 5 MG PO TABS
5.0000 mg | ORAL_TABLET | ORAL | Status: AC
  Administered 2013-11-30: 5 mg via ORAL

## 2013-11-30 MED ORDER — DIAZEPAM 5 MG PO TABS
ORAL_TABLET | ORAL | Status: AC
  Filled 2013-11-30: qty 1

## 2013-11-30 MED ORDER — SODIUM CHLORIDE 0.9 % IJ SOLN: 3.0000 mL | INTRAMUSCULAR | Status: DC | PRN

## 2013-11-30 MED ORDER — HYDROMORPHONE HCL PF 1 MG/ML IJ SOLN
INTRAMUSCULAR | Status: AC
  Filled 2013-11-30: qty 1

## 2013-11-30 MED ORDER — SODIUM CHLORIDE 0.9 % IV SOLN
INTRAVENOUS | Status: DC
  Administered 2013-11-30: 07:00:00 via INTRAVENOUS

## 2013-11-30 MED ORDER — LIDOCAINE HCL (PF) 1 % IJ SOLN
INTRAMUSCULAR | Status: AC
Start: 2013-11-30 — End: 2013-11-30
  Filled 2013-11-30: qty 30

## 2013-11-30 NOTE — H&P (View-Only) (Signed)
Patient ID: Kendra Todd, female   DOB: 09-27-1941, 73 y.o.   MRN: [id number]   Location:  [facility]   Allergies  Allergen Reactions  . Iron Hives  . Norvasc [Amlodipine Besylate]   . Promethazine Other (See Comments)    hallucinations  . Vicodin [Hydrocodone-Acetaminophen]   . Vioxx [Rofecoxib]     Chief Complaint  Patient presents with  . Acute Visit    abdominal cramps & no bowel movement in 7 days, taken bottle Magnesium Citrate, Dulcolax with no relief.  [name] other    declines Flu vaccines, no refills needed.    HPI: Patient is a 73 y.o. black female seen in the office today for constipation and no bm in 6 days.  Was taking more tylenol with codeine due to pain--for her arthritis. No bm in 6 days.  Normally moves bowels daily to every other day.  Last BM was Friday so actually 6 days.  Having cramps.  Has had some gas come out, but no actually bm.  Tried mag citrate and used dulcolax stool softener.   BP poorly controlled--for stent placement in kidney later this month.  On three meds.  Is feeling full quickly due to not going for so long.    Review of Systems:  Review of Systems  Constitutional: Positive for malaise/fatigue. Negative for fever and chills.  Respiratory: Negative for shortness of breath.   Cardiovascular: Negative for chest pain.  Gastrointestinal: Positive for abdominal pain and constipation. Negative for diarrhea, blood in stool and melena.  Genitourinary: Negative for dysuria.  Musculoskeletal: Positive for joint pain.  Neurological: Negative for weakness.    Past Medical History  Diagnosis Date  . Arthritis   . Atrial fibrillation 02/21/2011  . Hypertension   . Hyperlipidemia   . Myocardial infarction   . Diabetes mellitus age 72  . CAD (coronary artery disease)     prior coronary stenting  . Stroke 2001  . Peripheral vascular disease   . Carotid artery disease 2001    s/p Bilateral CEA, after CVA  .  TR (tricuspid regurgitation), severe 12/02/12 for repair with CABG 12/03/2012  . Pulmonary HTN 12/03/2012  . Dyspnea 03/13/2006    Myoview - EF 76%; mild ischemia in apical lateral region, less severe than previous study in 2002  . H/O endarterectomy 09/11/2012    patent L and R carotid sites, R 60-79% restenosed ICA; L <40% restenosed ICA  . Stenosis of artery 06/03/2012    doppler- most distal aspect of abd aorta no aneurysmal dilation; bilateral ABIs - mild L arterial insufficiency, L CIA narrowing w/ 50-69% diameter reduction; L and R SFA both w/ 0-49% diameter reductions  . H/O endarterectomy 01/08/2012    doppler - R distal common carotid/proximal ICA stenosed 80-99%, L 0-39%  . Decreased pedal pulses 06/04/2011    doppler - bilateral ABIs no evidence of insufficiency; L CIA slightly elevated velocities 20-30% diameter reduction;   . Limb pain 05/02/2011    doppler - no evidence of thrombus of thrombophlebitis  . Diabetes   . H/O carotid endarterectomy 03/06/2012    CT angio -high grade stenosis of proximal R internal carotid w/ lg posterior ulceration or dissection flap; near occlusive stenosis at origin of nondominantproximal L vertebral artery; <50% stenosis of proximal R vertebral and proximal L subclavian artery  . Urinary tract infection, site not specified   . Tobacco use disorder   . Unspecified cataract   . Myalgia  and myositis, unspecified   . Cervicalgia   . Acute upper respiratory infections of unspecified site   . Tobacco use disorder   . Peripheral vascular disease, unspecified   . Atrial fibrillation   . Phlebitis and thrombophlebitis of superficial vessels of lower extremities   . Edema   . Long term (current) use of anticoagulants   . Loss of weight   . Abdominal pain, epigastric   . Atrial fibrillation   . Benign paroxysmal positional vertigo   . Type II or unspecified type diabetes mellitus with peripheral circulatory disorders, uncontrolled(250.72)   . Unspecified  vitamin D deficiency   . Other manifestations of vitamin A deficiency   . Type II or unspecified type diabetes mellitus with peripheral circulatory disorders, not stated as uncontrolled(250.70)   . Primary localized osteoarthrosis, hand   . Abdominal pain, epigastric   . Nontoxic uninodular goiter   . Nonspecific abnormal results of liver function study   . Candidiasis of vulva and vagina   . Routine gynecological examination   . Unspecified disorder of iris and ciliary body   . Spinal stenosis, unspecified region other than cervical   . Other malaise and fatigue   . Pain in joint, pelvic region and thigh   . Cervicalgia   . Other and unspecified hyperlipidemia   . Edema   . Coronary atherosclerosis of unspecified type of vessel, native or graft   . Pain in limb   . Osteoarthrosis, unspecified whether generalized or localized, unspecified site   . Occlusion and stenosis of carotid artery without mention of cerebral infarction   . Lumbago   . Myalgia and myositis, unspecified   . Renal artery stenosis     Past Surgical History  Procedure Laterality Date  . Carotid endarterectomy  2001    Bilateral  . Cholecystectomy    . Kidney stone surgery      Removal  . Back surgery  [phone]    three previous surgeries  . Angioplasty  1984 and 1982  . Abdominal hysterectomy  1975  . Percutaneous coronary stent intervention (pci-s)  2002    2 BMS in ostial/Prox & mid RCA (mid 2.5 mm 20x  mm, & 2.75 mm x  8 mm ostial)  . Coronary artery bypass graft  12/04/2012    Procedure: CORONARY ARTERY BYPASS GRAFTING (CABG);  Surgeon: Ivin Poot, MD;  Location: [facility];  Service: Open Heart Surgery;  Laterality: N/A;  . Intraoperative transesophageal echocardiogram  12/04/2012    Procedure: INTRAOPERATIVE TRANSESOPHAGEAL ECHOCARDIOGRAM;  Surgeon: Ivin Poot, MD;  Location: [facility];  Service: Open Heart Surgery;  Laterality: N/A;  . Appendectomy  1954  . Tee with cardioversion  01/23/2011     EF [id number]; grade 2 diastolic dysfunction, elevated mean LA filling pressure, RV systolic pressure increased consistent w/ mod pulmonary hypertension  . Arch aortogram  03/30/2011    40% recurrent stenosis at origin of R proximal carotid patch, shelf like recurrent stenosis w/in midsection of patch that does not appear to be flow limiting; normal non-stenosed great vessel origins    Social History:   reports that she quit smoking about a year ago. Her smoking use included Cigarettes. She has a 17.1 pack-year smoking history. She has never used smokeless tobacco. She reports that she drinks alcohol. She reports that she does not use illicit drugs.  Family History  Problem Relation Age of Onset  . Other Mother     CVA  . Diabetes Mother   .  Heart disease Mother     Heart disease before age 37  . Heart attack Mother   . Heart disease Father     Heart Disease before age 58  . Heart attack Father   . Heart disease Sister     Amputation  . Diabetes Sister   . Heart disease Brother     Heart disease before age 62  . Diabetes Brother   . Stroke Brother   . Stroke Brother   . Heart disease Brother   . Stroke Brother   . Heart disease Brother   . Heart disease Sister   . Heart disease Sister   . Diabetes Other   . Cancer Other     breast  . Heart disease Other     cad  . Other Other     cva  . Diabetes Daughter   . Cancer Sister     Medications: Patient's Medications  New Prescriptions   No medications on file  Previous Medications   ACETAMINOPHEN-CODEINE (TYLENOL #3) 300-30 MG PER TABLET    TAKE 1 TABLET BY MOUTH EVERY 4 HOURS AS NEEDED FOR PAIN   ASPIRIN EC 325 MG EC TABLET    Take 1 tablet (325 mg total) by mouth daily.   HYDRALAZINE (APRESOLINE) 25 MG TABLET    Take one tablet by mouth three times daily   LATANOPROST (XALATAN) 0.005 % OPHTHALMIC SOLUTION    Place 1 drop into both eyes at bedtime.   METFORMIN (FORTAMET) 1000 MG (OSM) 24 HR TABLET    TAKE 1 TABLET BEFORE  BREAKFAST AND 1 TABLET BEFORE SUPPER TO CONTROL DIABETES   METOPROLOL (LOPRESSOR) 50 MG TABLET    Take two tablets by mouth twice daily to control blood pressure   NITROGLYCERIN PO    Take 0.4 mg by mouth as needed. For chest pain.   ROSUVASTATIN (CRESTOR) 40 MG TABLET    One daily to lower cholessterol   VALSARTAN-HYDROCHLOROTHIAZIDE (DIOVAN-HCT) 320-25 MG PER TABLET    Take 1 tablet by mouth daily. For blood pressure.  Modified Medications   No medications on file  Discontinued Medications   No medications on file     Physical Exam: Filed Vitals:   11/12/13 1050  BP: 164/80  Pulse: 68  Temp: 99 F (37.2 C)  TempSrc: Oral  Resp: 16  Weight: 151 lb 6.4 oz (68.675 kg)  SpO2: 94%  Physical Exam  Constitutional: She is oriented to person, place, and time. She appears well-developed and well-nourished. No distress.  Eyes: Scleral icterus is present.  Cardiovascular: Normal rate, regular rhythm, normal heart sounds and intact distal pulses.   Pulmonary/Chest: Effort normal and breath sounds normal. No respiratory distress.  Abdominal: Soft. Bowel sounds are normal. She exhibits distension. She exhibits no mass. There is tenderness.  Feels full, palpable stool in bowel;  Rectal exam with small amts of hard stool in rectal vault, but not impacted  Neurological: She is alert and oriented to person, place, and time.  Psychiatric: She has a normal mood and affect.    Labs reviewed: Basic Metabolic Panel:  Recent Labs  11/28/12 0239  12/04/12 1915 12/05/12 0320 12/05/12 1600  03/02/13 1149 06/01/13 0853 09/21/13 0940  NA  --   < >  --  140  --   < > 140 139 139  K  --   < >  --  3.9  --   < > 5.3* 4.4 4.6  CL  --   < >  --  109  --   < > 103 100 98  CO2  --   < >  --  23  --   < > 25 26 25   GLUCOSE  --   < >  --  120*  --   < > 110* 100* 128*  BUN  --   < >  --  11  --   < > 18 28* 30*  CREATININE  --   < > 0.67 0.77 1.04  < > 1.23* 1.03* 1.28*  CALCIUM  --   < >  --  8.2*   --   < > 9.9 9.9 9.9  MG  --   --  3.2* 2.8* 2.8*  --   --   --   --   TSH 2.654  --   --   --   --   --   --   --   --   < > = values in this interval not displayed. Liver Function Tests:  Recent Labs  11/28/12 0920 12/03/12 1534 03/02/13 1149 09/21/13 0940  AST 24 15 22 24   ALT 8 11 12 13   ALKPHOS 50 57 72 52  BILITOT 0.3 0.4 0.4 0.5  PROT 6.3 6.6 6.3 6.6  ALBUMIN 3.3* 3.1*  --   --   CBC:  Recent Labs  12/09/12 0535 12/10/12 0518 12/11/12 0540  WBC 4.6 5.8 7.3  HGB 9.5* 9.4* 9.6*  HCT 29.7* 29.4* 29.8*  MCV 82.0 81.7 81.0  PLT 222 264 289   Lipid Panel:  Recent Labs  11/28/12 0239 03/02/13 1149 06/01/13 0853 09/21/13 0940  CHOL 160  --   --   --   HDL 49 56 57 53  LDLCALC 99 198* 100* 107*  TRIG 59 62 71 71  CHOLHDL 3.3 4.8* 3.0 3.3   Lab Results  Component Value Date   HGBA1C 6.8* 09/21/2013   Assessment/Plan 1. Acute constipation -rule out small bowel obstruction -no evidence of impaction on rectal exam - DG Abd 1 View  2. Generalized abdominal cramps -due to constipation  Labs/tests ordered: KUB with call report to my cell phone--I will give her instruction for outpatient mgt or to go to hospital if obstructed further up Next appt:  As scheduled

## 2013-11-30 NOTE — Discharge Instructions (Signed)
Angiography, Care After °Refer to this sheet in the next few weeks. These instructions provide you with information on caring for yourself after your procedure. Your health care provider may also give you more specific instructions. Your treatment has been planned according to current medical practices, but problems sometimes occur. Call your health care provider if you have any problems or questions after your procedure.  °WHAT TO EXPECT AFTER THE PROCEDURE °After your procedure, it is typical to have the following sensations: °· Minor discomfort or tenderness and a small bump at the catheter insertion site. The bump should usually decrease in size and tenderness within 1 to 2 weeks. °· Any bruising will usually fade within 2 to 4 weeks. °HOME CARE INSTRUCTIONS  °· You may need to keep taking blood thinners if they were prescribed for you. Only take over-the-counter or prescription medicines for pain, fever, or discomfort as directed by your health care provider. °· Do not apply powder or lotion to the site. °· Do not sit in a bathtub, swimming pool, or whirlpool for 5 to 7 days. °· You may shower 24 hours after the procedure. Remove the bandage (dressing) and gently wash the site with plain soap and water. Gently pat the site dry. °· Inspect the site at least twice daily. °· Limit your activity for the first 48 hours. Do not bend, squat, or lift anything over 20 lb (9 kg) or as directed by your health care provider. °· Do not drive home if you are discharged the day of the procedure. Have someone else drive you. Follow instructions about when you can drive or return to work. °SEEK MEDICAL CARE IF: °· You get lightheaded when standing up. °· You have drainage (other than a small amount of blood on the dressing). °· You have chills. °· You have a fever. °· You have redness, warmth, swelling, or pain at the insertion site. °SEEK IMMEDIATE MEDICAL CARE IF:  °· You develop chest pain or shortness of breath, feel faint,  or pass out. °· You have bleeding, swelling larger than a walnut, or drainage from the catheter insertion site. °· You develop pain, discoloration, coldness, or severe bruising in the leg or arm that held the catheter. °· You develop bleeding from any other place, such as the bowels. You may see bright red blood in your urine or stools, or your stools may appear black and tarry. °· You have heavy bleeding from the site. If this happens, hold pressure on the site. °MAKE SURE YOU: °· Understand these instructions. °· Will watch your condition. °· Will get help right away if you are not doing well or get worse. °Document Released: 05/10/2005 Document Revised: 06/24/2013 Document Reviewed: 03/16/2013 °Exit[employer]® Patient Information ©2014 [employer]. ° °

## 2013-11-30 NOTE — H&P (Signed)
  Pt admitted for renal angio and possible intervention for RAS and Renal vascular HTN  Pt was reexamined and existing H & P reviewed. No changes found.  Lorretta Harp, MD [facility] 11/30/2013 7:27 AM

## 2013-11-30 NOTE — CV Procedure (Signed)
Kendra Todd is a 73 y.o. female    [id number] LOCATION:  FACILITY: [facility]  PHYSICIAN: Quay Burow, M.D. 1941-01-20   DATE OF PROCEDURE:  11/30/2013  DATE OF DISCHARGE:     PV Angiogram/Intervention    History obtained from chart review.[name] is a 73 year old African American female patient of Dr. [name] is history of CAD status post coronary artery bypass grafting last year. She has a history of carotid artery disease status post bilateral carotid endarterectomies in the past as well as hypertension. She had renal Dopplers performed in office in November that suggested bilateral renal artery stenosis possibly contributing to her resistant hypertension. Because of this she presents now for angiography and potential intervention for treatment of renal artery stenosis/hypertension.   PROCEDURE DESCRIPTION:   The patient was brought to the second floor [facility] [place] [facility]iac cath lab in the postabsorptive state. She was premedicated with Valium 5 mg by mouth, IV Versed and fentanyl. Her right groinwas prepped and shaved in usual sterile fashion. Xylocaine 1% was used for local anesthesia. A 5 French sheath was inserted into the right common femoral artery using standard Seldinger technique. A 5 French pigtail catheter across of her catheters were used for midstream and distal abdominal aortography, bilateral iliac angiography, selective right and left renal artery angiography. Selective iliac angiography was performed with a crossover catheter. Visipaque dye was used for the entirety of the case and 71 cc of contrast administered the patient). Retrograde aortic pressure was monitored the case.   HEMODYNAMICS:    AO SYSTOLIC/AO DIASTOLIC: 564/33   Angiographic Data:   1: Abdominal aortogram-the renal arteries appeared patent on abdominal aortography. The abdominal aorta above and below the renal arteries appeared moderately atherosclerotic.  2: Left lower extremity-there is a 75% fairly  focal distal left common iliac artery stenosis.  3: Right lower extremity-there was a 60% plaque in the proximal right common iliac artery that appeared ulcerated. There was approximately a 20 mm pullback gradient using a 5 French endhole catheter and administration of 200 mcg of intra-arterial nitroglycerin glycerin through the SideArm sheath.  4: Renal arteries-there was approximately 40-50% bilateral renal artery stenosis noted on selective right and left renal artery angiograms.  IMPRESSION:moderate but not severe bilateral renal artery stenosis. This did not appear to be hematoma was significant. I suspected that the renal Dopplers overestimate the degree of stenosis the patient has "essential hypertension". She has moderate iliac disease but it is not clear to me whether she has lifestyle limiting claudication. This will need to be further investigated. The sheath was removed and pressure held on the groin to achieve hemostasis. The patient left the Cath Lab in stable condition. She'll be hydrated, remain recumbent for 4 hours and will be discharged home. She will see him mid-level provider back in one to 2 weeks and me back in 4-6 weeks. We'll obtain lower extremity arterial Doppler studies.    Lorretta Harp MD, [facility] 11/30/2013 8:35 AM

## 2013-11-30 NOTE — Interval H&P Note (Signed)
History and Physical Interval Note:  11/30/2013 7:26 AM  Kendra Todd  has presented today for surgery, with the diagnosis of Renal artery stenosis  The various methods of treatment have been discussed with the patient and family. After consideration of risks, benefits and other options for treatment, the patient has consented to  Procedure(s): RENAL ANGIOGRAM (N/A) as a surgical intervention .  The patient's history has been reviewed, patient examined, no change in status, stable for surgery.  I have reviewed the patient's chart and labs.  Questions were answered to the patient's satisfaction.     Lorretta Harp

## 2013-12-01 LAB — GLUCOSE, CAPILLARY: GLUCOSE-CAPILLARY: 112 mg/dL — AB (ref 70–99)

## 2013-12-07 ENCOUNTER — Encounter: Payer: Self-pay | Admitting: Vascular Surgery

## 2013-12-08 ENCOUNTER — Ambulatory Visit (HOSPITAL_COMMUNITY)
Admission: RE | Admit: 2013-12-08 | Discharge: 2013-12-08 | Disposition: A | Discharge status: Home / Self Care | Payer: Medicare Other | Source: Ambulatory Visit | Attending: Vascular Surgery | Admitting: Vascular Surgery

## 2013-12-08 ENCOUNTER — Telehealth (HOSPITAL_COMMUNITY): Payer: Self-pay | Admitting: *Deleted

## 2013-12-08 ENCOUNTER — Encounter: Payer: Self-pay | Admitting: Vascular Surgery

## 2013-12-08 ENCOUNTER — Ambulatory Visit (INDEPENDENT_AMBULATORY_CARE_PROVIDER_SITE_OTHER): Payer: Medicare Other | Admitting: Vascular Surgery

## 2013-12-08 VITALS — BP 163/77 | HR 53 | Resp 18 | Ht 62.5 in | Wt 148.0 lb

## 2013-12-08 DIAGNOSIS — Z48812 Encounter for surgical aftercare following surgery on the circulatory system: Secondary | ICD-10-CM | POA: Diagnosis not present

## 2013-12-08 DIAGNOSIS — I6529 Occlusion and stenosis of unspecified carotid artery: Secondary | ICD-10-CM

## 2013-12-08 DIAGNOSIS — Z9889 Other specified postprocedural states: Secondary | ICD-10-CM | POA: Diagnosis not present

## 2013-12-08 NOTE — Progress Notes (Signed)
Patient has today for followup of extracranial screw vascular occlusive disease. She is status post bilateral carotid endarterectomies by Dr. Amedeo Plenty in 2001 with a later redo on the left side. She is been followed with evidence of some recurrent stenosis on the right side. She reports that he has had no neurologic deficits. No amaurosis fugax transient ischemic attack or stroke. She recently underwent arteriogram for evaluation of possible real renal artery stenosis but this was felt to be not significant.  Past Medical History  Diagnosis Date  . Arthritis   . Atrial fibrillation 02/21/2011  . Hypertension   . Hyperlipidemia   . Myocardial infarction   . Diabetes mellitus age 73  . CAD (coronary artery disease)     prior coronary stenting  . Stroke 2001  . Peripheral vascular disease   . Carotid artery disease 2001    s/p Bilateral CEA, after CVA  . TR (tricuspid regurgitation), severe 12/02/12 for repair with CABG 12/03/2012  . Pulmonary HTN 12/03/2012  . Dyspnea 03/13/2006    Myoview - EF 76%; mild ischemia in apical lateral region, less severe than previous study in 2002  . H/O endarterectomy 09/11/2012    patent L and R carotid sites, R 60-79% restenosed ICA; L <40% restenosed ICA  . Stenosis of artery 06/03/2012    doppler- most distal aspect of abd aorta no aneurysmal dilation; bilateral ABIs - mild L arterial insufficiency, L CIA narrowing w/ 50-69% diameter reduction; L and R SFA both w/ 0-49% diameter reductions  . H/O endarterectomy 01/08/2012    doppler - R distal common carotid/proximal ICA stenosed 80-99%, L 0-39%  . Decreased pedal pulses 06/04/2011    doppler - bilateral ABIs no evidence of insufficiency; L CIA slightly elevated velocities 20-30% diameter reduction;   . Limb pain 05/02/2011    doppler - no evidence of thrombus of thrombophlebitis  . Diabetes   . H/O carotid endarterectomy 03/06/2012    CT angio -high grade stenosis of proximal R internal carotid w/ lg posterior  ulceration or dissection flap; near occlusive stenosis at origin of nondominantproximal L vertebral artery; <50% stenosis of proximal R vertebral and proximal L subclavian artery  . Urinary tract infection, site not specified   . Tobacco use disorder   . Unspecified cataract   . Myalgia and myositis, unspecified   . Cervicalgia   . Acute upper respiratory infections of unspecified site   . Tobacco use disorder   . Peripheral vascular disease, unspecified   . Atrial fibrillation   . Phlebitis and thrombophlebitis of superficial vessels of lower extremities   . Edema   . Long term (current) use of anticoagulants   . Loss of weight   . Abdominal pain, epigastric   . Atrial fibrillation   . Benign paroxysmal positional vertigo   . Type II or unspecified type diabetes mellitus with peripheral circulatory disorders, uncontrolled(250.72)   . Unspecified vitamin D deficiency   . Other manifestations of vitamin A deficiency   . Type II or unspecified type diabetes mellitus with peripheral circulatory disorders, not stated as uncontrolled(250.70)   . Primary localized osteoarthrosis, hand   . Abdominal pain, epigastric   . Nontoxic uninodular goiter   . Nonspecific abnormal results of liver function study   . Candidiasis of vulva and vagina   . Routine gynecological examination   . Unspecified disorder of iris and ciliary body   . Spinal stenosis, unspecified region other than cervical   . Other malaise and fatigue   .  Pain in joint, pelvic region and thigh   . Cervicalgia   . Other and unspecified hyperlipidemia   . Edema   . Coronary atherosclerosis of unspecified type of vessel, native or graft   . Pain in limb   . Osteoarthrosis, unspecified whether generalized or localized, unspecified site   . Occlusion and stenosis of carotid artery without mention of cerebral infarction   . Lumbago   . Myalgia and myositis, unspecified   . Renal artery stenosis     History  Substance Use  Topics  . Smoking status: Former Smoker -- 0.30 packs/day for 57 years    Types: Cigarettes    Quit date: 11/19/2012  . Smokeless tobacco: Never Used     Comment: pt states she is working on it but husband just passed  . Alcohol Use: 0.0 oz/week    1-2 Glasses of wine per week    Family History  Problem Relation Age of Onset  . Other Mother     CVA  . Diabetes Mother   . Heart disease Mother     Heart disease before age 83  . Heart attack Mother   . Heart disease Father     Heart Disease before age 18  . Heart attack Father   . Heart disease Sister     Amputation  . Diabetes Sister   . Heart disease Brother     Heart disease before age 61  . Diabetes Brother   . Stroke Brother   . Stroke Brother   . Heart disease Brother   . Stroke Brother   . Heart disease Brother   . Heart disease Sister   . Heart disease Sister   . Diabetes Other   . Cancer Other     breast  . Heart disease Other     cad  . Other Other     cva  . Diabetes Daughter   . Cancer Sister     Allergies  Allergen Reactions  . Iron Hives  . Norvasc [Amlodipine Besylate]   . Promethazine Other (See Comments)    hallucinations  . Vicodin [Hydrocodone-Acetaminophen]   . Vioxx [Rofecoxib]     Current outpatient prescriptions:aspirin 81 MG tablet, Take 81 mg by mouth daily., Disp: , Rfl: ;  hydrALAZINE (APRESOLINE) 50 MG tablet, Take 50 mg by mouth 3 (three) times daily., Disp: , Rfl: ;  latanoprost (XALATAN) 0.005 % ophthalmic solution, Place 1 drop into both eyes at bedtime., Disp: , Rfl: ;  metFORMIN (GLUCOPHAGE) 1000 MG tablet, Take 1,000 mg by mouth 2 (two) times daily with a meal., Disp: , Rfl:  metoprolol (LOPRESSOR) 50 MG tablet, Take 50 mg by mouth 2 (two) times daily., Disp: , Rfl: ;  nitroGLYCERIN (NITROSTAT) 0.4 MG SL tablet, Place 0.4 mg under the tongue every 5 (five) minutes as needed for chest pain., Disp: , Rfl: ;  rosuvastatin (CRESTOR) 40 MG tablet, Take 40 mg by mouth daily., Disp: ,  Rfl: ;  valsartan-hydrochlorothiazide (DIOVAN-HCT) 320-25 MG per tablet, Take 1 tablet by mouth daily. For blood pressure., Disp: , Rfl:   BP 163/77  Pulse 53  Resp 18  Ht 5' 2.5" (1.588 m)  Wt 148 lb (67.132 kg)  BMI 26.62 kg/m2  Body mass index is 26.62 kg/(m^2).       On physical exam: Well-developed well-nourished female in no acute distress Neurologically she is grossly intact Carotid incision is well-healed bilaterally, she has a harsh right carotid bruit and a soft left  carotid bruit 2+ radial pulses bilaterally Respirations equal  Carotid duplex today a lesser degree of stenosis and was produced in the past. This suggests less than 50% stenoses bilaterally. Impression and plan stable followup with no symptoms of carotid disease. We'll see her again in one year with repeat carotid duplex. I again reviewed symptoms of carotid disease and she'll notify us should this occur

## 2013-12-09 ENCOUNTER — Other Ambulatory Visit (HOSPITAL_COMMUNITY): Payer: Self-pay | Admitting: Cardiovascular Disease

## 2013-12-09 ENCOUNTER — Ambulatory Visit (HOSPITAL_COMMUNITY)
Admission: RE | Admit: 2013-12-09 | Discharge: 2013-12-09 | Disposition: A | Discharge status: Home / Self Care | Payer: Medicare Other | Source: Ambulatory Visit | Attending: Cardiovascular Disease | Admitting: Cardiovascular Disease

## 2013-12-09 ENCOUNTER — Encounter: Payer: Self-pay | Admitting: *Deleted

## 2013-12-09 DIAGNOSIS — I6529 Occlusion and stenosis of unspecified carotid artery: Secondary | ICD-10-CM | POA: Diagnosis not present

## 2013-12-09 DIAGNOSIS — I658 Occlusion and stenosis of other precerebral arteries: Secondary | ICD-10-CM | POA: Insufficient documentation

## 2013-12-09 DIAGNOSIS — I739 Peripheral vascular disease, unspecified: Secondary | ICD-10-CM

## 2013-12-09 NOTE — Progress Notes (Signed)
Lower Extremity Arterial Duplex Completed. °Brianna L Mazza,RVT °

## 2013-12-10 ENCOUNTER — Other Ambulatory Visit: Payer: Self-pay | Admitting: *Deleted

## 2013-12-10 DIAGNOSIS — I6529 Occlusion and stenosis of unspecified carotid artery: Secondary | ICD-10-CM

## 2013-12-14 ENCOUNTER — Encounter: Payer: Self-pay | Admitting: Physician Assistant

## 2013-12-14 ENCOUNTER — Ambulatory Visit (INDEPENDENT_AMBULATORY_CARE_PROVIDER_SITE_OTHER): Payer: Medicare Other | Admitting: Physician Assistant

## 2013-12-14 VITALS — BP 136/72 | HR 68 | Ht 62.5 in | Wt 145.2 lb

## 2013-12-14 DIAGNOSIS — I10 Essential (primary) hypertension: Secondary | ICD-10-CM | POA: Diagnosis not present

## 2013-12-14 DIAGNOSIS — I2510 Atherosclerotic heart disease of native coronary artery without angina pectoris: Secondary | ICD-10-CM

## 2013-12-14 DIAGNOSIS — I739 Peripheral vascular disease, unspecified: Secondary | ICD-10-CM

## 2013-12-14 DIAGNOSIS — I4891 Unspecified atrial fibrillation: Secondary | ICD-10-CM

## 2013-12-14 NOTE — Patient Instructions (Signed)
Your physician wants you to follow-up in 6 weeks Dr Andria Rhein will receive a reminder letter in the mail two months in advance. If you don't receive a letter, please call our office to schedule the follow-up appointment.

## 2013-12-14 NOTE — Progress Notes (Signed)
Date:  12/14/2013   ID:  ADAYSHA Todd, DOB [birth year], MRN [id number]  PCP:  Estill Dooms, MD  Primary Cardiologist:  Gwenlyn Found Croitoru    History of Present Illness: Kendra Todd is a 73 y.o. female who PV angiogram on 11/30/2013 revealing moderate but not severe bilateral renal artery stenosis.  Her history also includes atrial fibrillation, hypertension, hyperlipidemia, coronary artery disease, diabetes mellitus peripheral vascular disease with bilateral carotid endarterectomies 2001. Carotid duplex ultrasound shows progression of stenosis in the right internal carotid artery at the site of previous endarterectomy, now 60% to 79% stenosis. Possible need for right carotid stent has been discussed. Patient presents today for followup after her renal artery angiogram.  She reports some left shoulder blade pain which is concerned about. It is tender to palpation..  she reports continued pain in her lower extremities but also in her back and this resolves when she rests for a little while. Recent lower extremity Dopplers revealed ABI on the right of 1.01 and on the left of 0.81 left is slightly decreased from prior in July of 2013  The patient currently denies nausea, vomiting, fever, chest pain, shortness of breath, orthopnea, dizziness, PND, cough, congestion, abdominal pain, hematochezia, melena, lower extremity edema.  Wt Readings from Last 3 Encounters:  12/14/13 145 lb 3.2 oz (65.862 kg)  12/08/13 148 lb (67.132 kg)  11/30/13 147 lb (66.679 kg)     Past Medical History  Diagnosis Date  . Arthritis   . Atrial fibrillation 02/21/2011  . Hypertension   . Hyperlipidemia   . Myocardial infarction   . Diabetes mellitus age 31  . CAD (coronary artery disease)     prior coronary stenting  . Stroke 2001  . Peripheral vascular disease   . Carotid artery disease 2001    s/p Bilateral CEA, after CVA  . TR (tricuspid regurgitation), severe 12/02/12 for repair with CABG 12/03/2012  . Pulmonary  HTN 12/03/2012  . Dyspnea 03/13/2006    Myoview - EF 76%; mild ischemia in apical lateral region, less severe than previous study in 2002  . H/O endarterectomy 09/11/2012    patent L and R carotid sites, R 60-79% restenosed ICA; L <40% restenosed ICA  . Stenosis of artery 06/03/2012    doppler- most distal aspect of abd aorta no aneurysmal dilation; bilateral ABIs - mild L arterial insufficiency, L CIA narrowing w/ 50-69% diameter reduction; L and R SFA both w/ 0-49% diameter reductions  . H/O endarterectomy 01/08/2012    doppler - R distal common carotid/proximal ICA stenosed 80-99%, L 0-39%  . Decreased pedal pulses 06/04/2011    doppler - bilateral ABIs no evidence of insufficiency; L CIA slightly elevated velocities 20-30% diameter reduction;   . Limb pain 05/02/2011    doppler - no evidence of thrombus of thrombophlebitis  . Diabetes   . H/O carotid endarterectomy 03/06/2012    CT angio -high grade stenosis of proximal R internal carotid w/ lg posterior ulceration or dissection flap; near occlusive stenosis at origin of nondominantproximal L vertebral artery; <50% stenosis of proximal R vertebral and proximal L subclavian artery  . Urinary tract infection, site not specified   . Tobacco use disorder   . Unspecified cataract   . Myalgia and myositis, unspecified   . Cervicalgia   . Acute upper respiratory infections of unspecified site   . Tobacco use disorder   . Peripheral vascular disease, unspecified   . Atrial fibrillation   . Phlebitis and thrombophlebitis  of superficial vessels of lower extremities   . Edema   . Long term (current) use of anticoagulants   . Loss of weight   . Abdominal pain, epigastric   . Atrial fibrillation   . Benign paroxysmal positional vertigo   . Type II or unspecified type diabetes mellitus with peripheral circulatory disorders, uncontrolled(250.72)   . Unspecified vitamin D deficiency   . Other manifestations of vitamin A deficiency   . Type II or  unspecified type diabetes mellitus with peripheral circulatory disorders, not stated as uncontrolled(250.70)   . Primary localized osteoarthrosis, hand   . Abdominal pain, epigastric   . Nontoxic uninodular goiter   . Nonspecific abnormal results of liver function study   . Candidiasis of vulva and vagina   . Routine gynecological examination   . Unspecified disorder of iris and ciliary body   . Spinal stenosis, unspecified region other than cervical   . Other malaise and fatigue   . Pain in joint, pelvic region and thigh   . Cervicalgia   . Other and unspecified hyperlipidemia   . Edema   . Coronary atherosclerosis of unspecified type of vessel, native or graft   . Pain in limb   . Osteoarthrosis, unspecified whether generalized or localized, unspecified site   . Occlusion and stenosis of carotid artery without mention of cerebral infarction   . Lumbago   . Myalgia and myositis, unspecified   . Renal artery stenosis     Current Outpatient Prescriptions  Medication Sig Dispense Refill  . acetaminophen-codeine (TYLENOL #3) 300-30 MG per tablet Take 1 tablet by mouth every 6 (six) hours as needed for moderate pain.      [name] aspirin 81 MG tablet Take 81 mg by mouth daily.      . hydrALAZINE (APRESOLINE) 50 MG tablet Take 50 mg by mouth 3 (three) times daily.      [name] latanoprost (XALATAN) 0.005 % ophthalmic solution Place 1 drop into both eyes at bedtime.      . metFORMIN (GLUCOPHAGE) 1000 MG tablet Take 1,000 mg by mouth 2 (two) times daily with a meal.      . metoprolol (LOPRESSOR) 50 MG tablet Take 50 mg by mouth 2 (two) times daily.      . nitroGLYCERIN (NITROSTAT) 0.4 MG SL tablet Place 0.4 mg under the tongue every 5 (five) minutes as needed for chest pain.      . rosuvastatin (CRESTOR) 40 MG tablet Take 40 mg by mouth daily.      . valsartan-hydrochlorothiazide (DIOVAN-HCT) 320-25 MG per tablet Take 1 tablet by mouth daily. For blood pressure.       No current facility-administered  medications for this visit.    Allergies:    Allergies  Allergen Reactions  . Iron Hives  . Norvasc [Amlodipine Besylate]   . Promethazine Other (See Comments)    hallucinations  . Vicodin [Hydrocodone-Acetaminophen]   . Vioxx [Rofecoxib]     Social History:  The patient  reports that she quit smoking about 12 months ago. Her smoking use included Cigarettes. She has a 17.1 pack-year smoking history. She has never used smokeless tobacco. She reports that she drinks alcohol. She reports that she does not use illicit drugs.   Family history:   Family History  Problem Relation Age of Onset  . Other Mother     CVA  . Diabetes Mother   . Heart disease Mother     Heart disease before age 30  . Heart attack  Mother   . Heart disease Father     Heart Disease before age 12  . Heart attack Father   . Heart disease Sister     Amputation  . Diabetes Sister   . Heart disease Brother     Heart disease before age 58  . Diabetes Brother   . Stroke Brother   . Stroke Brother   . Heart disease Brother   . Stroke Brother   . Heart disease Brother   . Heart disease Sister   . Heart disease Sister   . Diabetes Other   . Cancer Other     breast  . Heart disease Other     cad  . Other Other     cva  . Diabetes Daughter   . Cancer Sister     ROS:  Please see the history of present illness.  All other systems reviewed and negative.   PHYSICAL EXAM: VS:  BP 136/72  Pulse 68  Ht 5' 2.5" (1.588 m)  Wt 145 lb 3.2 oz (65.862 kg)  BMI 26.12 kg/m2 Well nourished, well developed, in no acute distress HEENT: Pupils are equal round react to light accommodation extraocular movements are intact.  Neck: no JVDNo cervical lymphadenopathy. Cardiac: Regular rate and rhythm with 1/6 systolic murmur Lungs:  clear to auscultation bilaterally, no wheezing, rhonchi or rales Abd: soft, nontender, positive bowel sounds all quadrants, no hepatosplenomegaly positive for bruit. Ext: no lower extremity  edema.  2+ radial and 1 +dorsalis pedis pulses. Skin: warm and dry Neuro:  Grossly normal     ASSESSMENT AND PLAN:  Problem List Items Addressed This Visit   CAD (coronary artery disease): 1993 PTCA to circumflex.  2002 2 BMS stents to ostial, S/P CABG x 3 12/04/12 (Chronic)     No Chest pain at this time appropriate medications.    HTN (hypertension) - Primary (Chronic)     Blood pressure is currently controlled.    PAD (peripheral artery disease)     Mildly decreased ABI on recent lower extremity arterial Dopplers when compared to previous in July 2013 and went from 0.98 to .81.    Atrial fibrillation     Currently in a regular rhythm.

## 2013-12-14 NOTE — Assessment & Plan Note (Signed)
Blood pressure is currently controlled.

## 2013-12-14 NOTE — Assessment & Plan Note (Signed)
No Chest pain at this time appropriate medications.

## 2013-12-14 NOTE — Assessment & Plan Note (Signed)
Currently in a regular rhythm

## 2013-12-14 NOTE — Assessment & Plan Note (Addendum)
Mildly decreased ABI on recent lower extremity arterial Dopplers when compared to previous in July 2013 and went from 0.98 to .81.

## 2014-01-08 ENCOUNTER — Encounter: Payer: Self-pay | Admitting: Cardiovascular Disease

## 2014-01-08 ENCOUNTER — Ambulatory Visit (INDEPENDENT_AMBULATORY_CARE_PROVIDER_SITE_OTHER): Payer: Medicare Other | Admitting: Cardiovascular Disease

## 2014-01-08 VITALS — BP 130/80 | HR 67 | Ht 62.0 in | Wt 146.8 lb

## 2014-01-08 DIAGNOSIS — I6529 Occlusion and stenosis of unspecified carotid artery: Secondary | ICD-10-CM

## 2014-01-08 DIAGNOSIS — I701 Atherosclerosis of renal artery: Secondary | ICD-10-CM

## 2014-01-08 DIAGNOSIS — I10 Essential (primary) hypertension: Secondary | ICD-10-CM

## 2014-01-08 NOTE — Assessment & Plan Note (Signed)
The patient underwent angiography by myself with arthritic/15 revealing moderate bilateral renal artery stenosis. I suspect the renal duplex overestimate the degree of blockage and most likely her hypertension is essential. Her right femoral artery this is well-healed. We'll continue to follow her carotid lotion a renal Doppler studies. I will see her back on a when necessary basis.

## 2014-01-08 NOTE — Progress Notes (Signed)
01/08/2014 Kendra Todd   1941-07-09  [id number]  Primary Physician GREEN, Kendra Spare, MD Primary Cardiologist: Kendra Harp MD Kendra Todd   HPI:  Kendra Todd is a 73 year old African American female patient of Kendra Todd is history of CAD status post coronary artery bypass grafting last year. She has a history of carotid artery disease status post bilateral carotid endarterectomies in the past as well as hypertension. She had renal Dopplers performed in office in November that suggested bilateral renal artery stenosis possibly contributing to her resistant hypertension. Because of this sheunderwent peripheral angiography revealing only moderate bilateral renal artery stenosis. This suggested the Dopplers of her estimated stenosis and her hypertension was most likely essential.  Current Outpatient Prescriptions  Medication Sig Dispense Refill  . acetaminophen-codeine (TYLENOL #3) 300-30 MG per tablet Take 1 tablet by mouth every 6 (six) hours as needed for moderate pain.      [name] aspirin 81 MG tablet Take 81 mg by mouth daily.      . hydrALAZINE (APRESOLINE) 50 MG tablet Take 50 mg by mouth 3 (three) times daily.      [name] latanoprost (XALATAN) 0.005 % ophthalmic solution Place 1 drop into both eyes at bedtime.      . metFORMIN (GLUCOPHAGE) 1000 MG tablet Take 1,000 mg by mouth 2 (two) times daily with a meal.      . metoprolol (LOPRESSOR) 50 MG tablet Take 50 mg by mouth 2 (two) times daily.      . nitroGLYCERIN (NITROSTAT) 0.4 MG SL tablet Place 0.4 mg under the tongue every 5 (five) minutes as needed for chest pain.      . rosuvastatin (CRESTOR) 40 MG tablet Take 40 mg by mouth daily.      . valsartan-hydrochlorothiazide (DIOVAN-HCT) 320-25 MG per tablet Take 1 tablet by mouth daily. For blood pressure.       No current facility-administered medications for this visit.    Allergies  Allergen Reactions  . Iron Hives  . Norvasc [Amlodipine Besylate]   . Promethazine Other  (See Comments)    hallucinations  . Vicodin [Hydrocodone-Acetaminophen]   . Vioxx [Rofecoxib]     History   Social History  . Marital Status: Widowed    Spouse Name: N/A    Number of Children: N/A  . Years of Education: N/A   Occupational History  . Not on file.   Social History Main Topics  . Smoking status: Former Smoker -- 0.30 packs/day for 57 years    Types: Cigarettes    Quit date: 11/19/2012  . Smokeless tobacco: Never Used     Comment: pt states she is working on it but husband just passed  . Alcohol Use: 0.0 oz/week    1-2 Glasses of wine per week  . Drug Use: No  . Sexual Activity: Not on file   Other Topics Concern  . Not on file   Social History Narrative  . No narrative on file     Review of Systems: General: negative for chills, fever, night sweats or weight changes.  Cardiovascular: negative for chest pain, dyspnea on exertion, edema, orthopnea, palpitations, paroxysmal nocturnal dyspnea or shortness of breath Dermatological: negative for rash Respiratory: negative for cough or wheezing Urologic: negative for hematuria Abdominal: negative for nausea, vomiting, diarrhea, bright red blood per rectum, melena, or hematemesis Neurologic: negative for visual changes, syncope, or dizziness All other systems reviewed and are otherwise negative except as noted above.    Blood pressure  130/80, pulse 67, height 5\' 2"  (1.575 m), weight 66.588 kg (146 lb 12.8 oz).  General appearance: alert and no distress Neck: no adenopathy, no JVD, supple, symmetrical, trachea midline, thyroid not enlarged, symmetric, no tenderness/mass/nodules and bilateral carotid bruits Lungs: clear to auscultation bilaterally Heart: regular rate and rhythm, S1, S2 normal, no murmur, click, rub or gallop Extremities: extremities normal, atraumatic, no cyanosis or edema and right femoral arterial puncture site was well-healed  EKG normal sinus rhythm at 67 without ST or T wave  changes  ASSESSMENT AND PLAN:   Renal artery stenosis The patient underwent angiography by myself with arthritic/15 revealing moderate bilateral renal artery stenosis. I suspect the renal duplex overestimate the degree of blockage and most likely her hypertension is essential. Her right femoral artery this is well-healed. We'll continue to follow her carotid lotion a renal Doppler studies. I will see her back on a when necessary basis.      Kendra Harp MD FACP,FACC,FAHA, [facility] 01/08/2014 4:25 PM

## 2014-01-08 NOTE — Patient Instructions (Signed)
Follow up with Dr Berry as needed.  

## 2014-01-13 DIAGNOSIS — M5380 Other specified dorsopathies, site unspecified: Secondary | ICD-10-CM | POA: Diagnosis not present

## 2014-01-13 DIAGNOSIS — M549 Dorsalgia, unspecified: Secondary | ICD-10-CM | POA: Diagnosis not present

## 2014-01-13 DIAGNOSIS — M546 Pain in thoracic spine: Secondary | ICD-10-CM | POA: Diagnosis not present

## 2014-01-18 ENCOUNTER — Other Ambulatory Visit: Payer: Medicare Other

## 2014-01-18 DIAGNOSIS — I10 Essential (primary) hypertension: Secondary | ICD-10-CM

## 2014-01-18 DIAGNOSIS — E1129 Type 2 diabetes mellitus with other diabetic kidney complication: Secondary | ICD-10-CM

## 2014-01-18 DIAGNOSIS — E785 Hyperlipidemia, unspecified: Secondary | ICD-10-CM | POA: Diagnosis not present

## 2014-01-19 LAB — COMPREHENSIVE METABOLIC PANEL
ALK PHOS: 56 IU/L (ref 39–117)
ALT: 10 IU/L (ref 0–32)
AST: 18 IU/L (ref 0–40)
Albumin/Globulin Ratio: 1.8 (ref 1.1–2.5)
Albumin: 4.2 g/dL (ref 3.5–4.8)
BILIRUBIN TOTAL: 0.5 mg/dL (ref 0.0–1.2)
BUN/Creatinine Ratio: 22 (ref 11–26)
BUN: 23 mg/dL (ref 8–27)
CHLORIDE: 105 mmol/L (ref 97–108)
CO2: 23 mmol/L (ref 18–29)
Calcium: 9.4 mg/dL (ref 8.7–10.3)
Creatinine, Ser: 1.04 mg/dL — ABNORMAL HIGH (ref 0.57–1.00)
GFR calc non Af Amer: 53 mL/min/{1.73_m2} — ABNORMAL LOW (ref >59)
GFR, EST AFRICAN AMERICAN: 61 mL/min/{1.73_m2} (ref >59)
GLUCOSE: 103 mg/dL — AB (ref 65–99)
Globulin, Total: 2.3 g/dL (ref 1.5–4.5)
POTASSIUM: 5.3 mmol/L — AB (ref 3.5–5.2)
Sodium: 140 mmol/L (ref 134–144)
Total Protein: 6.5 g/dL (ref 6.0–8.5)

## 2014-01-19 LAB — HEMOGLOBIN A1C
Est. average glucose Bld gHb Est-mCnc: 137 mg/dL
HEMOGLOBIN A1C: 6.4 % — AB (ref 4.8–5.6)

## 2014-01-19 LAB — LIPID PANEL
CHOLESTEROL TOTAL: 171 mg/dL (ref 100–199)
Chol/HDL Ratio: 3.1 ratio units (ref 0.0–4.4)
HDL: 55 mg/dL (ref >39)
LDL Calculated: 105 mg/dL — ABNORMAL HIGH (ref 0–99)
Triglycerides: 55 mg/dL (ref 0–149)
VLDL CHOLESTEROL CAL: 11 mg/dL (ref 5–40)

## 2014-01-20 ENCOUNTER — Ambulatory Visit (INDEPENDENT_AMBULATORY_CARE_PROVIDER_SITE_OTHER): Payer: Medicare Other | Admitting: Internal Medicine

## 2014-01-20 ENCOUNTER — Encounter: Payer: Self-pay | Admitting: Internal Medicine

## 2014-01-20 VITALS — BP 138/90 | HR 51 | Resp 10 | Wt 150.0 lb

## 2014-01-20 DIAGNOSIS — E1129 Type 2 diabetes mellitus with other diabetic kidney complication: Secondary | ICD-10-CM

## 2014-01-20 DIAGNOSIS — E785 Hyperlipidemia, unspecified: Secondary | ICD-10-CM

## 2014-01-20 DIAGNOSIS — I6529 Occlusion and stenosis of unspecified carotid artery: Secondary | ICD-10-CM

## 2014-01-20 DIAGNOSIS — I10 Essential (primary) hypertension: Secondary | ICD-10-CM

## 2014-01-20 DIAGNOSIS — I2510 Atherosclerotic heart disease of native coronary artery without angina pectoris: Secondary | ICD-10-CM

## 2014-01-20 DIAGNOSIS — M25519 Pain in unspecified shoulder: Secondary | ICD-10-CM

## 2014-01-20 NOTE — Patient Instructions (Signed)
Continue current medications. 

## 2014-01-20 NOTE — Progress Notes (Signed)
Patient ID: Kendra Todd, female   DOB: [birth year], 73 y.o.   MRN: [id number]    Location:    [facility]  Place of Service:  OFFICE   Allergies  Allergen Reactions  . Iron Hives  . Norvasc [Amlodipine Besylate]   . Promethazine Other (See Comments)    hallucinations  . Vicodin [Hydrocodone-Acetaminophen]   . Vioxx [Rofecoxib]     Chief Complaint  Patient presents with  . Medical Managment of Chronic Issues    4 month follow-up, discuss labs   . Shoulder Pain    Left shoulder pain, no known injury     HPI:  Type II or unspecified type diabetes mellitus with renal manifestations, not stated as uncontrolled(250.40): controlled  HTN (hypertension): controlled  HLD (hyperlipidemia): controlled  CAD (coronary artery disease): 1993 PTCA to circumflex.  2002 2 BMS stents to ostial, S/P CABG x 3 12/04/12: no recent angina  Pain in joint, shoulder region: left side anteriorly. Not painful all the time. Has shooting pain. Thinks it might be nerve damage from prior surgery. Does not last long.    Medications: Patient's Medications  New Prescriptions   No medications on file  Previous Medications   ACETAMINOPHEN-CODEINE (TYLENOL #3) 300-30 MG PER TABLET    Take 1 tablet by mouth every 6 (six) hours as needed for moderate pain.   ASPIRIN 81 MG TABLET    Take 81 mg by mouth daily.   HYDRALAZINE (APRESOLINE) 50 MG TABLET    Take 50 mg by mouth 3 (three) times daily.   LATANOPROST (XALATAN) 0.005 % OPHTHALMIC SOLUTION    Place 1 drop into both eyes at bedtime.   METFORMIN (GLUCOPHAGE) 1000 MG TABLET    Take 1,000 mg by mouth 2 (two) times daily with a meal.   METOPROLOL (LOPRESSOR) 50 MG TABLET    Take 50 mg by mouth 2 (two) times daily.   NITROGLYCERIN (NITROSTAT) 0.4 MG SL TABLET    Place 0.4 mg under the tongue every 5 (five) minutes as needed for chest pain.   ROSUVASTATIN (CRESTOR) 40 MG TABLET    Take 40 mg by mouth daily.   VALSARTAN-HYDROCHLOROTHIAZIDE (DIOVAN-HCT) 320-25 MG PER  TABLET    Take 1 tablet by mouth daily. For blood pressure.  Modified Medications   No medications on file  Discontinued Medications   No medications on file     Review of Systems  Constitutional: Negative.   HENT: Negative.   Eyes: Negative.   Respiratory: Negative.   Cardiovascular: Negative for chest pain, palpitations and leg swelling.  Endocrine: Negative for polydipsia, polyphagia and polyuria.       History diabetes  Genitourinary: Positive for urgency and frequency. Negative for flank pain.  Musculoskeletal: Positive for arthralgias.  Skin: Negative.   Neurological: Negative.   Hematological: Negative.   Psychiatric/Behavioral: Negative.     Filed Vitals:   01/20/14 1130  BP: 138/90  Pulse: 51  Resp: 10  Weight: 150 lb (68.04 kg)  SpO2: 95%   Physical Exam  Constitutional: She is oriented to person, place, and time. She appears well-developed and well-nourished. No distress.  HENT:  Head: Normocephalic.  Nose: Nose normal.  Upper and lower dentures present.  Eyes: Conjunctivae are normal. Pupils are equal, round, and reactive to light.  Wears prescription lenses. Right cataract.  Neck: Normal range of motion. Neck supple. No JVD present. No tracheal deviation present. No thyromegaly present.  Cardiovascular: Normal rate, regular rhythm and normal heart sounds.  Exam reveals  no gallop and no friction rub.   No murmur heard. Diminished dorsalis pedis pulses bilaterally and diminished posterior tibial pulses bilaterally.  Pulmonary/Chest: Effort normal and breath sounds normal. No respiratory distress. She has no wheezes. She has no rales. She exhibits no tenderness.  Abdominal: Soft. Bowel sounds are normal. She exhibits no distension and no mass. There is no tenderness.  Musculoskeletal: She exhibits no edema and no tenderness.  Lymphadenopathy:    She has no cervical adenopathy.  Neurological: She is alert and oriented to person, place, and time. She has  normal reflexes. No cranial nerve deficit. Coordination normal.  Skin: Skin is warm and dry. No rash noted. No erythema. No pallor.  Psychiatric: She has a normal mood and affect. Her behavior is normal. Judgment normal.     Labs reviewed: Appointment on 01/18/2014  Component Date Value Ref Range Status  . Hemoglobin A1C 01/18/2014 6.4* 4.8 - 5.6 % Final   Comment:          Increased risk for diabetes: 5.7 - 6.4                                   Diabetes: >6.4                                   Glycemic control for adults with diabetes: <7.0  . Estimated average glucose 01/18/2014 137   Final  . Glucose 01/18/2014 103* 65 - 99 mg/dL Final  . BUN 01/18/2014 23  8 - 27 mg/dL Final  . Creatinine, Ser 01/18/2014 1.04* 0.57 - 1.00 mg/dL Final  . GFR calc non Af Amer 01/18/2014 53* >59 mL/min/1.73 Final  . GFR calc Af Amer 01/18/2014 61  >59 mL/min/1.73 Final  . BUN/Creatinine Ratio 01/18/2014 22  11 - 26 Final  . Sodium 01/18/2014 140  134 - 144 mmol/L Final  . Potassium 01/18/2014 5.3* 3.5 - 5.2 mmol/L Final  . Chloride 01/18/2014 105  97 - 108 mmol/L Final  . CO2 01/18/2014 23  18 - 29 mmol/L Final  . Calcium 01/18/2014 9.4  8.7 - 10.3 mg/dL Final  . Total Protein 01/18/2014 6.5  6.0 - 8.5 g/dL Final  . Albumin 01/18/2014 4.2  3.5 - 4.8 g/dL Final  . Globulin, Total 01/18/2014 2.3  1.5 - 4.5 g/dL Final  . Albumin/Globulin Ratio 01/18/2014 1.8  1.1 - 2.5 Final  . Total Bilirubin 01/18/2014 0.5  0.0 - 1.2 mg/dL Final  . Alkaline Phosphatase 01/18/2014 56  39 - 117 IU/L Final  . AST 01/18/2014 18  0 - 40 IU/L Final  . ALT 01/18/2014 10  0 - 32 IU/L Final  . Cholesterol, Total 01/18/2014 171  100 - 199 mg/dL Final  . Triglycerides 01/18/2014 55  0 - 149 mg/dL Final  . HDL 01/18/2014 55  >39 mg/dL Final   Comment: According to ATP-III Guidelines, HDL-C >59 mg/dL is considered a                          negative risk factor for CHD.  [name] VLDL Cholesterol Cal 01/18/2014 11  5 - 40 mg/dL  Final  . LDL Calculated 01/18/2014 105* 0 - 99 mg/dL Final  . Chol/HDL Ratio 01/18/2014 3.1  0.0 - 4.4 ratio units Final   Comment:  T. Chol/HDL Ratio                                                                      Men  Women                                                        1/2 Avg.Risk  3.4    3.3                                                            Avg.Risk  5.0    4.4                                                         2X Avg.Risk  9.6    7.1                                                         3X Avg.Risk 23.4   11.0  Admission on 11/30/2013, Discharged on 11/30/2013  Component Date Value Ref Range Status  . Glucose-Capillary 11/30/2013 105* 70 - 99 mg/dL Final  . Comment 1 11/30/2013 Documented in Chart   Final  . Comment 2 11/30/2013 Notify RN   Final  . Sodium 11/30/2013 139  137 - 147 mEq/L Final  . Potassium 11/30/2013 4.0  3.7 - 5.3 mEq/L Final  . Chloride 11/30/2013 99  96 - 112 mEq/L Final  . CO2 11/30/2013 25  19 - 32 mEq/L Final  . Glucose, Bld 11/30/2013 123* 70 - 99 mg/dL Final  . BUN 11/30/2013 23  6 - 23 mg/dL Final  . Creatinine, Ser 11/30/2013 1.10  0.50 - 1.10 mg/dL Final  . Calcium 11/30/2013 9.4  8.4 - 10.5 mg/dL Final  . GFR calc non Af Amer 11/30/2013 48* >90 mL/min Final  . GFR calc Af Amer 11/30/2013 56* >90 mL/min Final   Comment: (NOTE)                          The eGFR has been calculated using the CKD EPI equation.                          This calculation has not been validated in all clinical situations.                          eGFR's persistently <90 mL/min signify possible Chronic Kidney  Disease.  . Glucose-Capillary 11/30/2013 114* 70 - 99 mg/dL Final  . Glucose-Capillary 11/30/2013 166* 70 - 99 mg/dL Final  . Comment 1 11/30/2013 Documented in Chart   Final  . Comment 2 11/30/2013 Notify RN   Final  . Glucose-Capillary 11/30/2013 112* 70 - 99 mg/dL Final       Assessment/Plan  1. Type II or unspecified type diabetes mellitus with renal manifestations, not stated as uncontrolled(250.40) controlled  2. HTN (hypertension) controlled  3. HLD (hyperlipidemia) controlled  4. CAD (coronary artery disease): 1993 PTCA to circumflex.  2002 2 BMS stents to ostial, S/P CABG x 3 12/04/12 Stable. No angina.  5. Pain in joint, shoulder region Observe.

## 2014-01-22 ENCOUNTER — Other Ambulatory Visit: Payer: Self-pay | Admitting: *Deleted

## 2014-01-22 MED ORDER — VALSARTAN-HYDROCHLOROTHIAZIDE 320-25 MG PO TABS: 1.0000 | ORAL_TABLET | Freq: Every day | ORAL | Status: DC

## 2014-01-22 NOTE — Telephone Encounter (Signed)
Patient Requested 

## 2014-02-18 ENCOUNTER — Other Ambulatory Visit: Payer: Self-pay

## 2014-02-18 DIAGNOSIS — Z1231 Encounter for screening mammogram for malignant neoplasm of breast: Secondary | ICD-10-CM

## 2014-02-20 IMAGING — CR DG CHEST 2V
2 series · 2 of 2 positions shown · non-contrast
Comparison: Single of the chest 01/22/2011 and CT chest 01/07/2008.

CLINICAL DATA: Chest pain.

CHEST - 2 VIEW

[w chest pa]
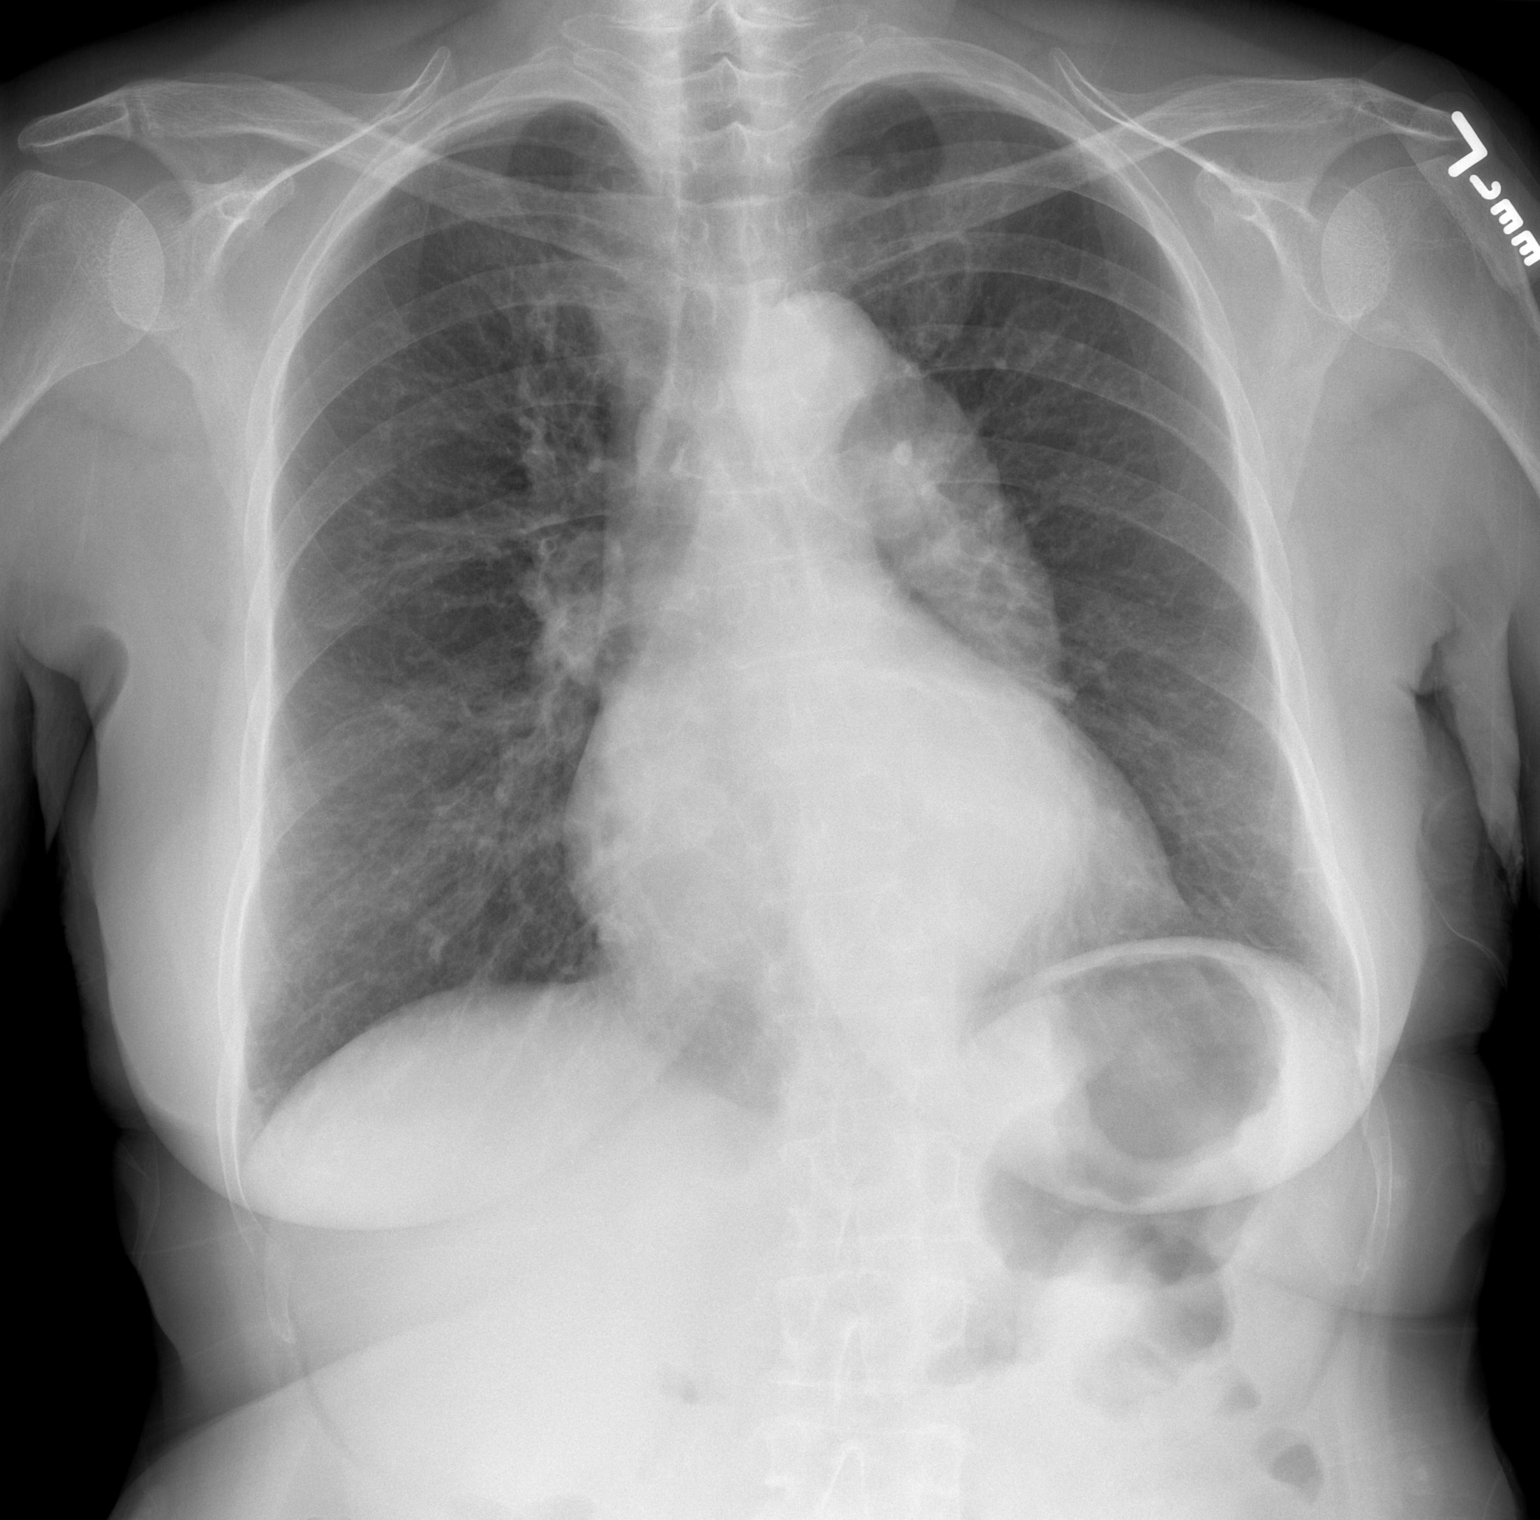

[w chest lat]
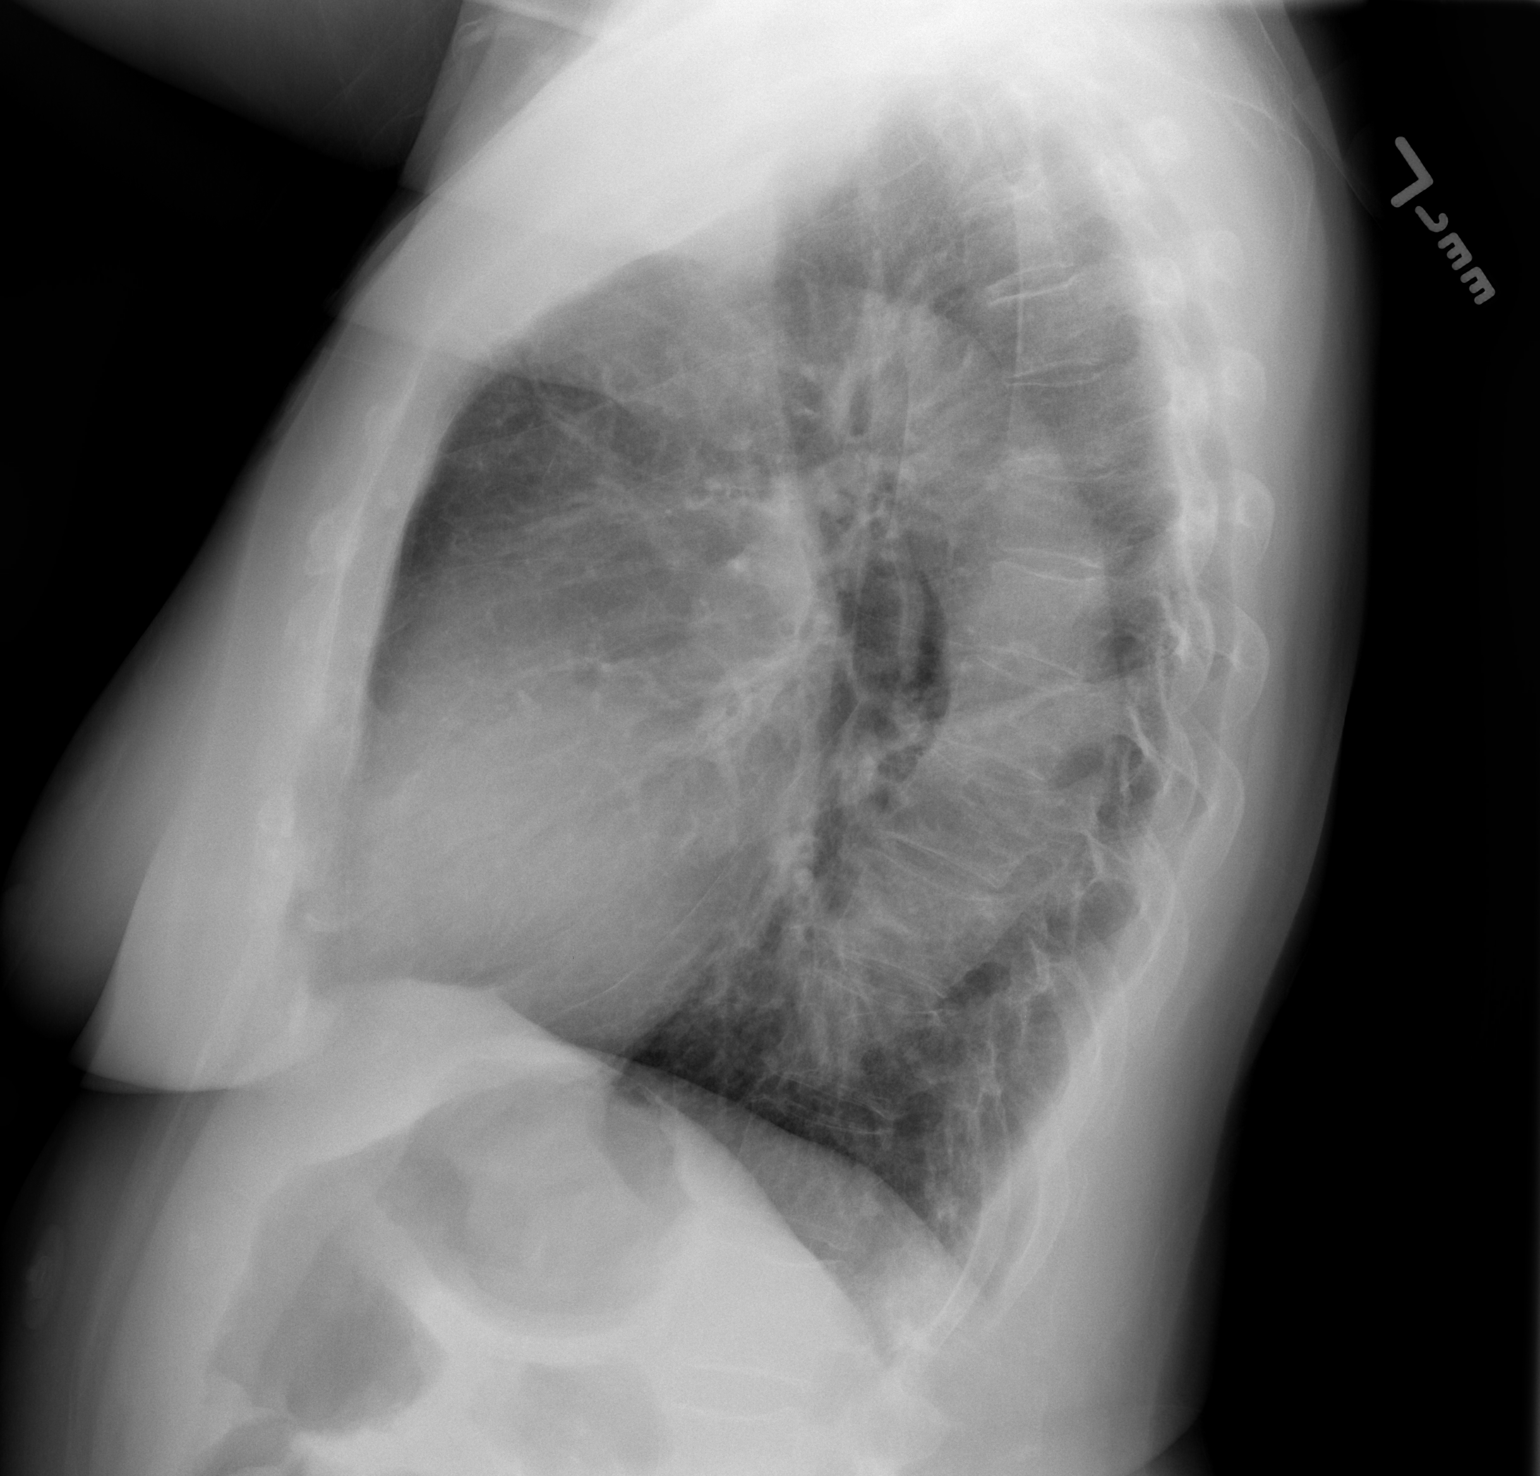

[2 of 2 positions shown; findings below may reference images not displayed]

FINDINGS: There is cardiomegaly without pulmonary edema.  The aorta
is markedly ectatic.  Lungs are clear.  No pneumothorax or pleural
effusion.
IMPRESSION: Cardiomegaly without acute disease.

## 2014-02-23 IMAGING — CT CT CHEST W/ CM
2 of 4 series · 14 of 36 positions shown, 17 images · IV contrast (APPLIED)
Comparison: Chest x-ray dated 11/27/2012

CLINICAL DATA: Coronary artery disease.  The patient is scheduled
for CABG.  Apparent history of pulmonary nodule.  The patient is a
smoker.

CT CHEST WITH CONTRAST
TECHNIQUE: Multidetector CT imaging of the chest was performed
following the standard protocol during bolus administration of
intravenous contrast.
Contrast:  80 ml [id number] IV

[Series 2: routine chest 5.0 st · axial · 0.63mm/px · z∈[-317,-17]mm · 11 of 70 slices shown, 14 images]
[im 5/70  mediastinal]
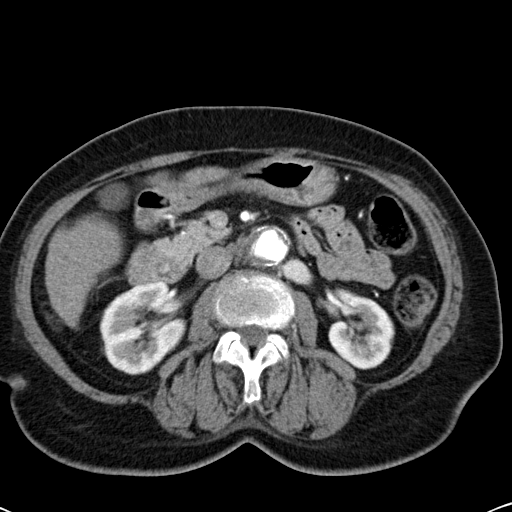
[im 5/70  lung]
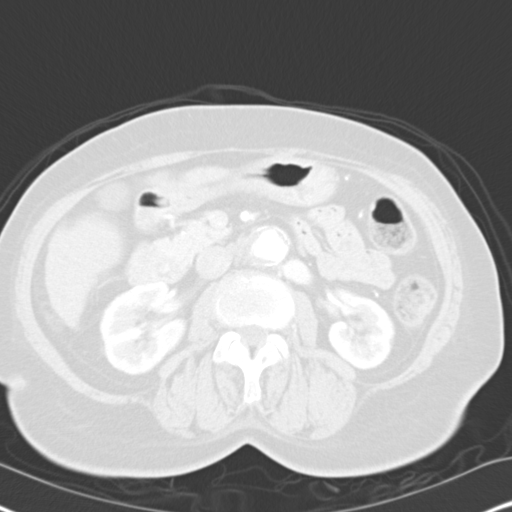
[im 10/70  lung]
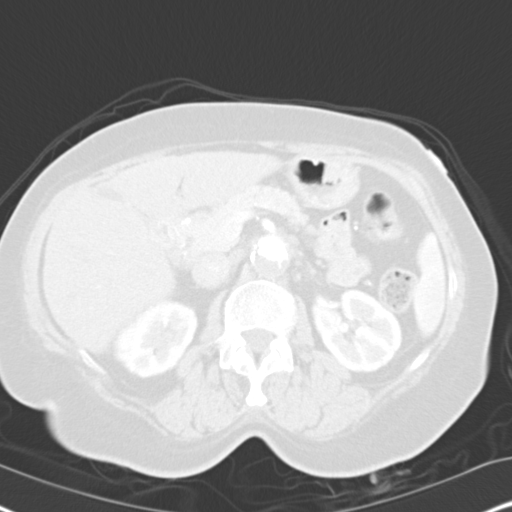
[im 19/70  lung]
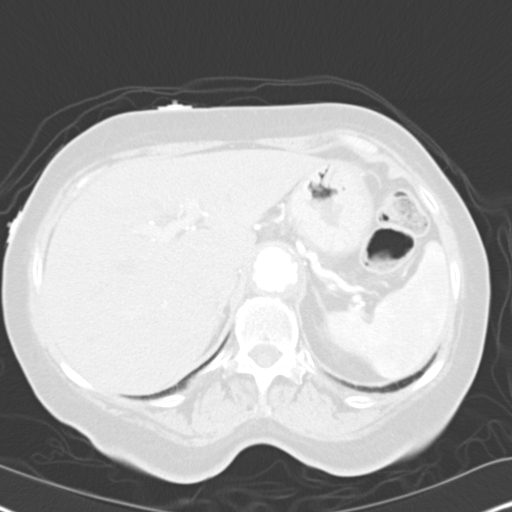
[im 24/70  lung]
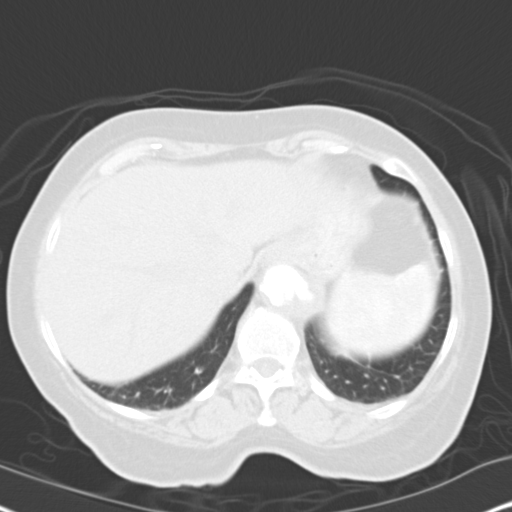
[im 28/70  mediastinal]
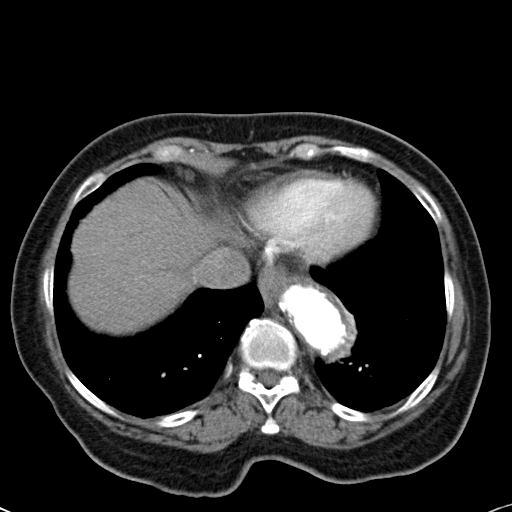
[im 28/70  lung]
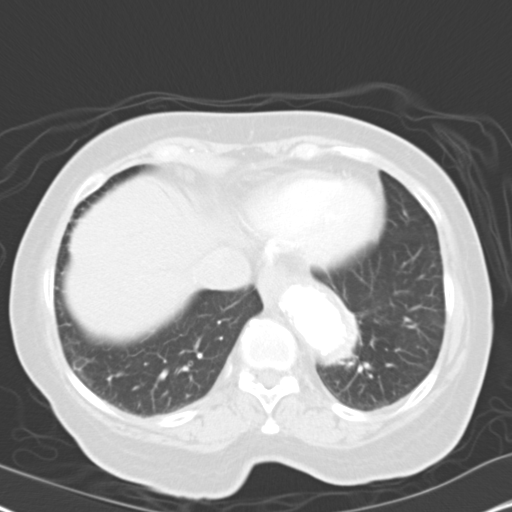
[im 37/70  lung]
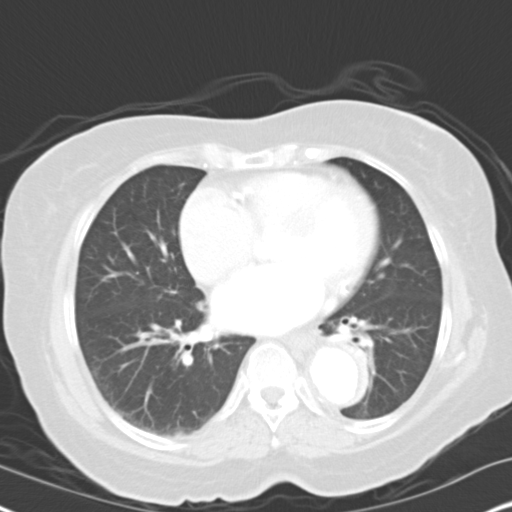
[im 42/70  lung]
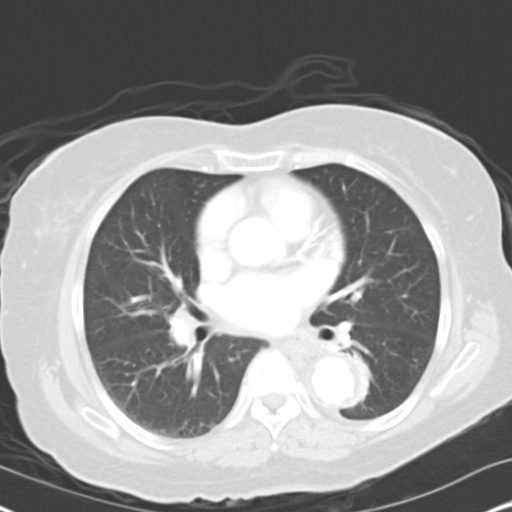
[im 47/70  lung]
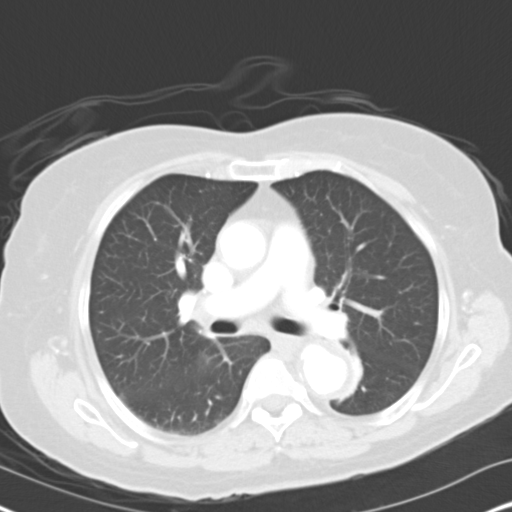
[im 51/70  mediastinal]
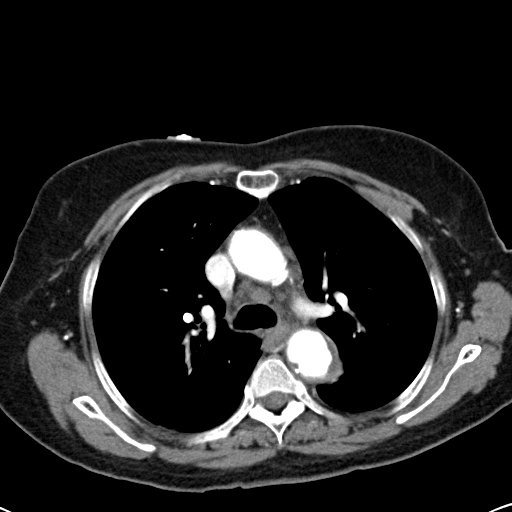
[im 51/70  lung]
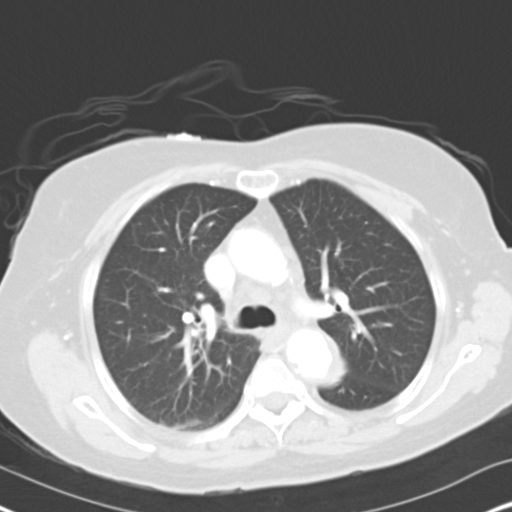
[im 60/70  lung]
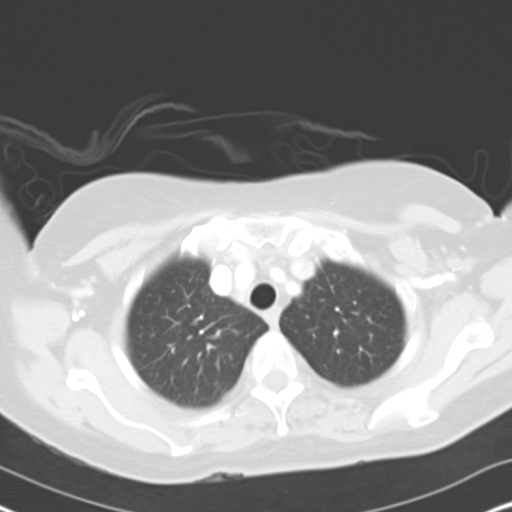
[im 65/70  lung]
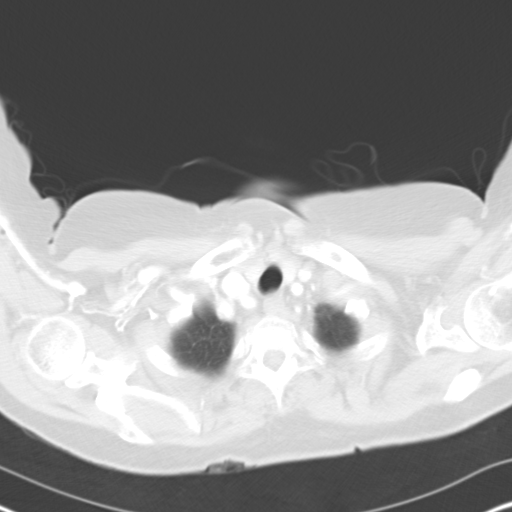

[Series 5: coronals · coronal · 0.62mm/px · 3 of 71 slices shown]
[im 15/71  lung]
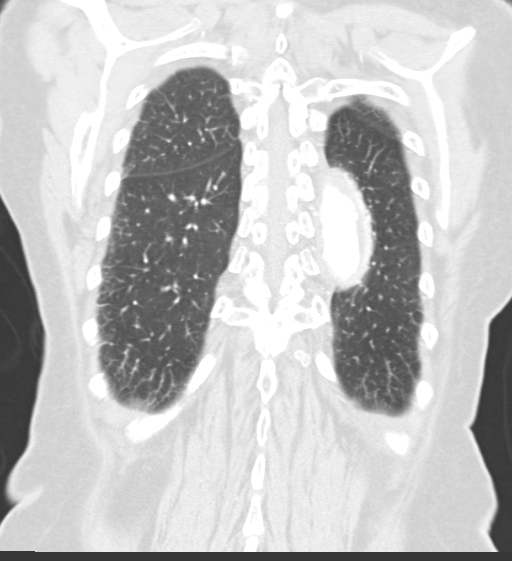
[im 29/71  lung]
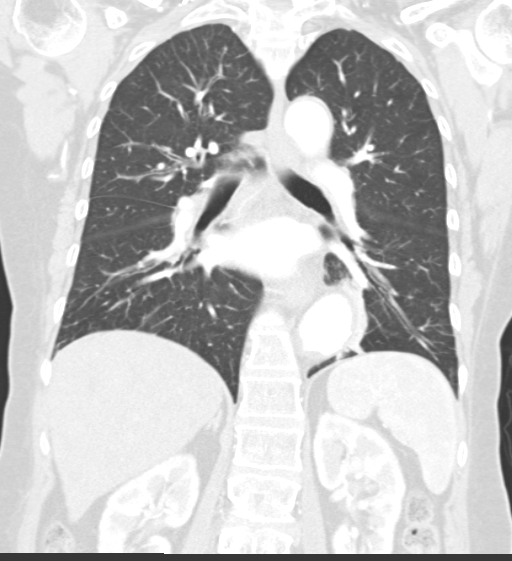
[im 43/71  lung]
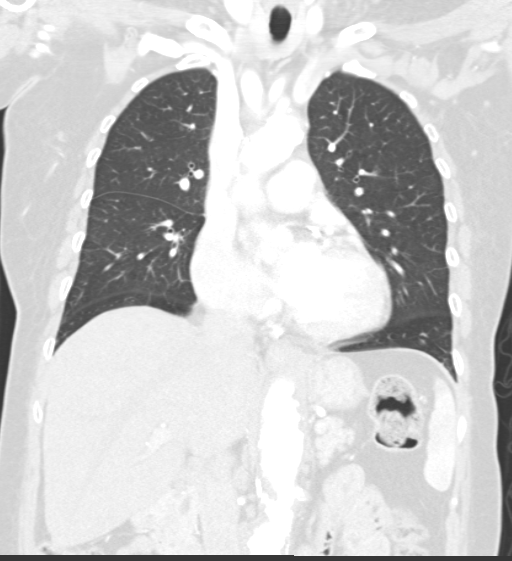

[14 of 36 positions shown; findings below may reference images not displayed]

FINDINGS: There are no pulmonary nodules or infiltrates.  Some
small paratracheal and pretracheal lymph nodes identified in the
mediastinum which are not enlarged.  Maximal short axis
measurements of lymph nodes are 9-10 mm.

Significant atherosclerotic disease is identified of the aorta.
Atherosclerotic plaque is present at the level of the aortic arch.
The visualized proximal great vessels show bovine anatomy.  The
proximal left subclavian artery shows approximately 50% stenosis.
Primary level of atherosclerotic disease involves the descending
thoracic aorta and visualized upper to mid abdominal aorta.  The
descending thoracic aorta is mildly dilated, measuring up to 3.4 cm
in maximal diameter.  Diffuse and irregular partially calcified
plaque, ulcerated noncalcified plaque and irregular mural thrombus
is identified.  The proximal abdominal aorta measures 3 cm in
transverse diameter at the level of the celiac axis.

The visualized upper abdominal aorta is well opacified and
demonstrates a large amount of irregular ulcerated soft
plaque/thrombus and dilated partially calcified plaque.
Significant atherosclerosis is seen at the origins of both celiac
axis and superior mesenteric artery with a roughly 50 - 70%
narrowing at the origin of the celiac axis and a significant 70 -
90% stenosis at the origin of the superior mesenteric artery.
Bilateral renal arteries are also identified with heavily calcified
plaque at the origin of the right renal artery causing probable
moderate range stenosis and partially calcified plaque at the
origin of the left renal artery causing likely mild stenosis.

The heart is enlarged.  Diffuse, calcified atherosclerotic plaque
is identified in the coronary artery tree.  The bony thorax shows
diffuse osteopenia.  No compression fractures or focal bony lesions
are identified.
IMPRESSION: 1.  No evidence of pulmonary nodule or lymphadenopathy in the
chest.
2.  Significant atherosclerosis of the descending thoracic and
visualized abdominal aorta as above.  There is a large amount of
ulcerated soft plaque and component of mural thrombus, particularly
in the lower descending thoracic and upper abdominal aorta.  This
may potentially predispose the patient for future embolic
complication. The descending thoracic aorta is mildly dilated,
measuring 3.4 cm in maximum diameter.
3. Significant atherosclerosis of the visceral arteries identified
in the upper abdomen.  Moderate origin stenosis of the celiac axis
and high-grade stenosis at the origin of the superior mesenteric
artery noted.  Both renal arteries show atherosclerotic disease at
the origins without critical stenosis.

## 2014-03-07 ENCOUNTER — Emergency Department (INDEPENDENT_AMBULATORY_CARE_PROVIDER_SITE_OTHER)
Admission: EM | Admit: 2014-03-07 | Discharge: 2014-03-07 | Disposition: A | Discharge status: Home / Self Care | Payer: Medicare Other | Source: Home / Self Care | Attending: Family Medicine | Admitting: Family Medicine

## 2014-03-07 ENCOUNTER — Encounter (HOSPITAL_COMMUNITY): Payer: Self-pay | Admitting: Emergency Medicine

## 2014-03-07 DIAGNOSIS — R42 Dizziness and giddiness: Secondary | ICD-10-CM | POA: Diagnosis not present

## 2014-03-07 NOTE — Discharge Instructions (Signed)
Thank you for coming in today. Follow up with Cardiology.  Go to the ER if your symptoms return.  Call or go to the emergency room if you get worse, have trouble breathing, have chest pains, or palpitations.    Dizziness  Dizziness means you feel unsteady or lightheaded. You might feel like you are going to pass out (faint). HOME CARE   Drink enough fluids to keep your pee (urine) clear or pale yellow.  Take your medicines exactly as told by your doctor. If you take blood pressure medicine, always stand up slowly from the lying or sitting position. Hold on to something to steady yourself.  If you need to stand in one place for a long time, move your legs often. Tighten and relax your leg muscles.  Have someone stay with you until you feel okay.  Do not drive or use heavy machinery if you feel dizzy.  Do not drink alcohol. GET HELP RIGHT AWAY IF:   You feel dizzy or lightheaded and it gets worse.  You feel sick to your stomach (nauseous), or you throw up (vomit).  You have trouble talking or walking.  You feel weak or have trouble using your arms, hands, or legs.  You cannot think clearly or have trouble forming sentences.  You have chest pain, belly (abdominal) pain, sweating, or you are short of breath.  Your vision changes.  You are bleeding.  You have problems from your medicine that seem to be getting worse. MAKE SURE YOU:   Understand these instructions.  Will watch your condition.  Will get help right away if you are not doing well or get worse. Document Released: 10/11/2011 Document Revised: 01/14/2012 Document Reviewed: 10/11/2011 [employer] [facility] Patient Information 2014 [place], [place].

## 2014-03-07 NOTE — ED Provider Notes (Signed)
Kendra Todd is a 73 y.o. female who presents to Urgent Care today for dizziness. Dizziness. Patient awoke his morning feeling somewhat lightheaded and dizzy. She measured her blood pressure at home and found to be low at 89/48 using a wrist monitor. She felt somewhat dizzy and lightheaded. She denies any vertigo. She denies any chest pains or palpitations. She thinks she took her blood pressure medication correctly. She notes she is improved rapidly. She continues to have some persistent lightheadedness but otherwise feels well. She denies any weakness or numbness loss of function trouble speaking or swallowing or trouble walking.  Her medical history is significant for vasculopathy   Past Medical History  Diagnosis Date  . Arthritis   . Atrial fibrillation 02/21/2011  . Hypertension   . Hyperlipidemia   . Myocardial infarction   . Diabetes mellitus age 63  . CAD (coronary artery disease)     prior coronary stenting  . Stroke 2001  . Peripheral vascular disease   . Carotid artery disease 2001    s/p Bilateral CEA, after CVA  . TR (tricuspid regurgitation), severe 12/02/12 for repair with CABG 12/03/2012  . Pulmonary HTN 12/03/2012  . Dyspnea 03/13/2006    Myoview - EF 76%; mild ischemia in apical lateral region, less severe than previous study in 2002  . H/O endarterectomy 09/11/2012    patent L and R carotid sites, R 60-79% restenosed ICA; L <40% restenosed ICA  . Stenosis of artery 06/03/2012    doppler- most distal aspect of abd aorta no aneurysmal dilation; bilateral ABIs - mild L arterial insufficiency, L CIA narrowing w/ 50-69% diameter reduction; L and R SFA both w/ 0-49% diameter reductions  . H/O endarterectomy 01/08/2012    doppler - R distal common carotid/proximal ICA stenosed 80-99%, L 0-39%  . Decreased pedal pulses 06/04/2011    doppler - bilateral ABIs no evidence of insufficiency; L CIA slightly elevated velocities 20-30% diameter reduction;   . Limb pain 05/02/2011    doppler -  no evidence of thrombus of thrombophlebitis  . Diabetes   . H/O carotid endarterectomy 03/06/2012    CT angio -high grade stenosis of proximal R internal carotid w/ lg posterior ulceration or dissection flap; near occlusive stenosis at origin of nondominantproximal L vertebral artery; <50% stenosis of proximal R vertebral and proximal L subclavian artery  . Urinary tract infection, site not specified   . Tobacco use disorder   . Unspecified cataract   . Myalgia and myositis, unspecified   . Cervicalgia   . Acute upper respiratory infections of unspecified site   . Tobacco use disorder   . Peripheral vascular disease, unspecified   . Atrial fibrillation   . Phlebitis and thrombophlebitis of superficial vessels of lower extremities   . Edema   . Long term (current) use of anticoagulants   . Loss of weight   . Abdominal pain, epigastric   . Atrial fibrillation   . Benign paroxysmal positional vertigo   . Type II or unspecified type diabetes mellitus with peripheral circulatory disorders, uncontrolled(250.72)   . Unspecified vitamin D deficiency   . Other manifestations of vitamin A deficiency   . Type II or unspecified type diabetes mellitus with peripheral circulatory disorders, not stated as uncontrolled(250.70)   . Primary localized osteoarthrosis, hand   . Abdominal pain, epigastric   . Nontoxic uninodular goiter   . Nonspecific abnormal results of liver function study   . Candidiasis of vulva and vagina   . Routine gynecological  examination   . Unspecified disorder of iris and ciliary body   . Spinal stenosis, unspecified region other than cervical   . Other malaise and fatigue   . Pain in joint, pelvic region and thigh   . Cervicalgia   . Other and unspecified hyperlipidemia   . Edema   . Coronary atherosclerosis of unspecified type of vessel, native or graft   . Pain in limb   . Osteoarthrosis, unspecified whether generalized or localized, unspecified site   . Occlusion and  stenosis of carotid artery without mention of cerebral infarction   . Lumbago   . Myalgia and myositis, unspecified   . Renal artery stenosis    History  Substance Use Topics  . Smoking status: Former Smoker -- 0.30 packs/day for 57 years    Types: Cigarettes    Quit date: 11/19/2012  . Smokeless tobacco: Never Used     Comment: pt states she is working on it but husband just passed  . Alcohol Use: 0.6 oz/week    1 Glasses of wine per week   ROS as above Medications: No current facility-administered medications for this encounter.   Current Outpatient Prescriptions  Medication Sig Dispense Refill  . acetaminophen-codeine (TYLENOL #3) 300-30 MG per tablet Take 1 tablet by mouth every 6 (six) hours as needed for moderate pain.      [name] aspirin 81 MG tablet Take 325 mg by mouth daily.       . hydrALAZINE (APRESOLINE) 50 MG tablet Take 50 mg by mouth 3 (three) times daily.      [name] latanoprost (XALATAN) 0.005 % ophthalmic solution Place 1 drop into both eyes at bedtime.      . metFORMIN (GLUCOPHAGE) 1000 MG tablet Take 1,000 mg by mouth 2 (two) times daily with a meal.      . metoprolol (LOPRESSOR) 50 MG tablet Take 50 mg by mouth 2 (two) times daily.      . nitroGLYCERIN (NITROSTAT) 0.4 MG SL tablet Place 0.4 mg under the tongue every 5 (five) minutes as needed for chest pain.      . rosuvastatin (CRESTOR) 40 MG tablet Take 40 mg by mouth daily.      . valsartan-hydrochlorothiazide (DIOVAN-HCT) 320-25 MG per tablet Take 1 tablet by mouth daily. For blood pressure.  90 tablet  3    Exam:  BP 144/49  Pulse 62  Temp(Src) 97.9 F (36.6 C) (Oral)  Resp 18  SpO2 100% Filed Vitals:   03/07/14 1156 03/07/14 1205 03/07/14 1207 03/07/14 1209  BP: 153/62 145/54 148/65 144/49  Pulse: 56 59 58 62  Temp: 97.9 F (36.6 C)     TempSrc: Oral     Resp:      SpO2: 100%       Gen: Well NAD HEENT: EOMI,  MMM PERRLA Lungs: Normal work of breathing. CTABL Heart: RRR no MRG Abd: NABS, Soft.  NT, ND Exts: Brisk capillary refill, warm and well perfused.  Neuro: Alert and oriented normal coordination bilaterally. Cranial nerves 2 through 12 are normal bilaterally.  Normal gait and balance. Normal reflexes Capillary refill and sensation are intact   Twelve-lead EKG shows sinus bradycardia at 58 beats per minute. No significant abnormalities otherwise.  No results found for this or any previous visit (from the past 24 hour(s)). No results found.  Assessment and Plan: 72 y.o. female with  dizziness with hypotension and bradycardia. Rapidly has improved. Patient's exam is normal today. She is not orthostatic. Her own abnormality  is mild bradycardia which has been present previous visit.  Her blood pressures normal currently.  I think the most likely explanation is accidental overdose with her blood pressure medications. We discussed proper medication administration. Recommend patient followup with her primary care doctor and cardiologist soon.  Additionally we discussed the possibility that there may be some underlying etiology that is undiagnosed at today's visit the urgent care. I explained that the urgent care setting and is insufficient to truly workup her problem with her medical history. She expresses understanding and is reluctant to go to the emergency room. She understands there is some risk with going home and following up with primary care provider.  Discussed warning signs or symptoms. Please see discharge instructions. Patient expresses understanding.    Gregor Hams, MD 03/07/14 [phone]

## 2014-03-07 NOTE — ED Notes (Signed)
Woke up this morning feeling lightheaded and dizziness.  BP was 89/48 at home.  Denies pain.  States vision got blurry and she started stumbling due to lightheadedness.

## 2014-03-08 ENCOUNTER — Telehealth: Payer: Self-pay | Admitting: Cardiovascular Disease

## 2014-03-09 ENCOUNTER — Ambulatory Visit: Payer: Medicare Other | Admitting: Cardiology

## 2014-03-09 ENCOUNTER — Ambulatory Visit (INDEPENDENT_AMBULATORY_CARE_PROVIDER_SITE_OTHER): Payer: Medicare Other | Admitting: Cardiology

## 2014-03-09 ENCOUNTER — Ambulatory Visit
Admission: RE | Admit: 2014-03-09 | Discharge: 2014-03-09 | Disposition: A | Discharge status: Home / Self Care | Payer: Medicare Other | Source: Ambulatory Visit

## 2014-03-09 VITALS — BP 208/88 | HR 55 | Ht 62.0 in | Wt 148.0 lb

## 2014-03-09 DIAGNOSIS — R5381 Other malaise: Secondary | ICD-10-CM

## 2014-03-09 DIAGNOSIS — Z1231 Encounter for screening mammogram for malignant neoplasm of breast: Secondary | ICD-10-CM

## 2014-03-09 DIAGNOSIS — R5383 Other fatigue: Principal | ICD-10-CM

## 2014-03-09 DIAGNOSIS — I2510 Atherosclerotic heart disease of native coronary artery without angina pectoris: Secondary | ICD-10-CM | POA: Diagnosis not present

## 2014-03-09 DIAGNOSIS — I10 Essential (primary) hypertension: Secondary | ICD-10-CM

## 2014-03-09 DIAGNOSIS — I6529 Occlusion and stenosis of unspecified carotid artery: Secondary | ICD-10-CM

## 2014-03-09 MED ORDER — HYDRALAZINE HCL 50 MG PO TABS: 50.0000 mg | ORAL_TABLET | Freq: Three times a day (TID) | ORAL | Status: DC

## 2014-03-09 NOTE — Patient Instructions (Addendum)
1. Increase your hydralazine to 75 mg three times a day and continue your other meds as you are taking them now  2. We will make an appt. To recheck your bloodpressure on Monday of next week  3.We are ordrering a blood test called a BMP that you can get done today or tomarrow

## 2014-03-09 NOTE — Telephone Encounter (Signed)
Closed encounter °

## 2014-03-12 LAB — BASIC METABOLIC PANEL
BUN: 27 mg/dL — AB (ref 6–23)
CHLORIDE: 102 meq/L (ref 96–112)
CO2: 25 mEq/L (ref 19–32)
Calcium: 9.2 mg/dL (ref 8.4–10.5)
Creat: 1.21 mg/dL — ABNORMAL HIGH (ref 0.50–1.10)
Glucose, Bld: 124 mg/dL — ABNORMAL HIGH (ref 70–99)
POTASSIUM: 4.5 meq/L (ref 3.5–5.3)
Sodium: 137 mEq/L (ref 135–145)

## 2014-03-15 ENCOUNTER — Encounter: Payer: Self-pay | Admitting: Cardiology

## 2014-03-15 MED ORDER — HYDRALAZINE HCL 50 MG PO TABS: 75.0000 mg | ORAL_TABLET | Freq: Three times a day (TID) | ORAL | Status: DC

## 2014-03-15 NOTE — Progress Notes (Signed)
Patient ID: Kendra Todd, female   DOB: [birth year], 73 y.o.   MRN: [id number]    03/09/2014 Kendra Todd   Mar 07, 1941  [id number]  Primary Physicia GREEN, Kendra Spare, MD Primary Cardiologist: Kendra Todd. Gwenlyn Todd  HPI:  Kendra Todd is a 73 year old African American female and  patient of Kendra Todd, with a history of CAD, status post coronary artery bypass grafting  in 2014. She has a history of carotid artery disease, status post bilateral carotid endarterectomies in the past as well as hypertension. She had renal Dopplers performed in our office in November, 2014, that suggested bilateral renal artery stenosis possibly contributing to her resistant hypertension. Because of this, she underwent peripheral angiography performed by Kendra Todd, revealing only moderate bilateral renal artery stenosis. This suggested the Dopplers over estimated the degree of her stenosis and that her hypertension was most likely essential.  She presents to clinic today with a complaint of persistently elevated blood pressures, despite full medication compliance and avoidance of high sodium foods. She denies tobacco use. No excessive caffeine use and no recent stress or anxiety. Several days ago she developed a headache and "felt funny". She checked her BP at home and it was elevated into the 782N systolic. Since then, she has continued to check her BP and systolic BPs have ranged from 160- low 200s. She denies chest pain, SOB, LEE, dizziness, syncope/ near syncope, AMS, slurred speech, visual disturbances, extremity numbness/ weakness.   Review of her medications reveals that she is on 50 mg of hydralazine TID, 50 mg of Lopressor BID and 320-25 of Diovan-HCT. She reports full compliance with all of theses. Norvasc is listed as an allergy, although she cannot recall the type of reaction she had in the past.   Her BP today is severely elevated in the low 562Z systolic. This was checked twice, once on arrival, then again after >10  minutes sitting in a chair with her feet flat on the floor. There was little improvement after the second measurement. She is currently asymptomatic.    Current Outpatient Prescriptions  Medication Sig Dispense Refill  . acetaminophen-codeine (TYLENOL #3) 300-30 MG per tablet Take 1 tablet by mouth every 6 (six) hours as needed for moderate pain.      [name] aspirin 81 MG tablet Take 325 mg by mouth daily.       . hydrALAZINE (APRESOLINE) 50 MG tablet Take 1.5 tablets (75 mg total) by mouth 3 (three) times daily.  135 tablet  3  . latanoprost (XALATAN) 0.005 % ophthalmic solution Place 1 drop into both eyes at bedtime.      . metFORMIN (GLUCOPHAGE) 1000 MG tablet Take 1,000 mg by mouth 2 (two) times daily with a meal.      . metoprolol (LOPRESSOR) 50 MG tablet Take 50 mg by mouth 2 (two) times daily.      . nitroGLYCERIN (NITROSTAT) 0.4 MG SL tablet Place 0.4 mg under the tongue every 5 (five) minutes as needed for chest pain.      . rosuvastatin (CRESTOR) 40 MG tablet Take 40 mg by mouth daily.      . valsartan-hydrochlorothiazide (DIOVAN-HCT) 320-25 MG per tablet Take 1 tablet by mouth daily. For blood pressure.  90 tablet  3   No current facility-administered medications for this visit.    Allergies  Allergen Reactions  . Iron Hives  . Norvasc [Amlodipine Besylate]   . Promethazine Other (See Comments)    hallucinations  . Vicodin [Hydrocodone-Acetaminophen]   .  Vioxx [Rofecoxib]     History   Social History  . Marital Status: Widowed    Spouse Name: N/A    Number of Children: N/A  . Years of Education: N/A   Occupational History  . Not on file.   Social History Main Topics  . Smoking status: Former Smoker -- 0.30 packs/day for 57 years    Types: Cigarettes    Quit date: 11/19/2012  . Smokeless tobacco: Never Used     Comment: pt states she is working on it but husband just passed  . Alcohol Use: 0.6 oz/week    1 Glasses of wine per week  . Drug Use: No  . Sexual  Activity: Not on file   Other Topics Concern  . Not on file   Social History Narrative  . No narrative on file     Review of Systems: General: negative for chills, fever, night sweats or weight changes.  Cardiovascular: negative for chest pain, dyspnea on exertion, edema, orthopnea, palpitations, paroxysmal nocturnal dyspnea or shortness of breath Dermatological: negative for rash Respiratory: negative for cough or wheezing Urologic: negative for hematuria Abdominal: negative for nausea, vomiting, diarrhea, bright red blood per rectum, melena, or hematemesis Neurologic: negative for visual changes, syncope, or dizziness All other systems reviewed and are otherwise negative except as noted above.    Blood pressure 208/88, pulse 55, height 5\' 2"  (1.575 m), weight 148 lb (67.132 kg).  General appearance: alert, cooperative and no distress Neck: no JVD and bilateral carotid bruits Lungs: clear to auscultation bilaterally Heart: regular rate and rhythm, S1, S2 normal, no murmur, click, rub or gallop Extremities: no LEE Pulses: 2+ and symmetric Skin: warm and dry Neurologic: Grossly normal  EKG not performed  ASSESSMENT AND PLAN:   HTN (hypertension) BP is severely elevated at 208/88.  She is asymptomatic. I have discussed case with Kendra Todd.  We reviewed her medications together. She is already on a thiazide diuretic, BB, ARB and hydralazine. She has an allergy/ intolerance to Norvasc. Because her HR is 55, we will avoid the initiation of clonidine, as this may potentially worsen her bradycardia. We have opted to increase her hydralazine to 75 mg TID. She has been instructed to continue to keep a regular check on her BP at home and to avoid high sodium foods and caffeine. She is to f/u in 1 week for repeat BP check. If she continues to be hypertensive, then we will need to consider changing her BB to Coreg, as this has a greater effect on BP than Lopressor.   CAD (coronary artery  disease): 1993 PTCA to circumflex.  2002 2 BMS stents to ostial, S/P CABG x 3 12/04/12 Stable. Denies chest pain.     PLAN  Increase Hydralazine to 75 mg TID. Continue all other meds as directed. F/u in 1 week for BP check.   Sabreena Vogan SimmonsPA-C 03/15/2014 3:19 PM

## 2014-03-15 NOTE — Assessment & Plan Note (Signed)
Stable. Denies chest pain.

## 2014-03-15 NOTE — Assessment & Plan Note (Signed)
BP is severely elevated at 208/88.  She is asymptomatic. I have discussed case with Dr. Gwenlyn Found.  We reviewed her medications together. She is already on a thiazide diuretic, BB, ARB and hydralazine. She has an allergy/ intolerance to Norvasc. Because her HR is 55, we will avoid the initiation of clonidine, as this may potentially worsen her bradycardia. We have opted to increase her hydralazine to 75 mg TID. She has been instructed to continue to keep a regular check on her BP at home and to avoid high sodium foods and caffeine. She is to f/u in 1 week for repeat BP check. If she continues to be hypertensive, then we will need to consider changing her BB to Coreg, as this has a greater effect on BP than Lopressor.

## 2014-04-12 DIAGNOSIS — H4011X0 Primary open-angle glaucoma, stage unspecified: Secondary | ICD-10-CM | POA: Diagnosis not present

## 2014-06-29 ENCOUNTER — Other Ambulatory Visit: Payer: Self-pay | Admitting: *Deleted

## 2014-06-29 DIAGNOSIS — E119 Type 2 diabetes mellitus without complications: Secondary | ICD-10-CM

## 2014-07-09 ENCOUNTER — Emergency Department (HOSPITAL_COMMUNITY): Payer: Medicare Other

## 2014-07-09 ENCOUNTER — Emergency Department (HOSPITAL_COMMUNITY)
Admission: EM | Admit: 2014-07-09 | Discharge: 2014-07-09 | Disposition: A | Discharge status: Home / Self Care | Payer: Medicare Other | Attending: Emergency Medicine | Admitting: Emergency Medicine

## 2014-07-09 ENCOUNTER — Encounter (HOSPITAL_COMMUNITY): Payer: Self-pay | Admitting: Emergency Medicine

## 2014-07-09 DIAGNOSIS — M25519 Pain in unspecified shoulder: Secondary | ICD-10-CM | POA: Diagnosis not present

## 2014-07-09 DIAGNOSIS — Z9861 Coronary angioplasty status: Secondary | ICD-10-CM | POA: Insufficient documentation

## 2014-07-09 DIAGNOSIS — M19049 Primary osteoarthritis, unspecified hand: Secondary | ICD-10-CM | POA: Insufficient documentation

## 2014-07-09 DIAGNOSIS — M546 Pain in thoracic spine: Secondary | ICD-10-CM | POA: Insufficient documentation

## 2014-07-09 DIAGNOSIS — Z87891 Personal history of nicotine dependence: Secondary | ICD-10-CM | POA: Diagnosis not present

## 2014-07-09 DIAGNOSIS — Z87440 Personal history of urinary (tract) infections: Secondary | ICD-10-CM | POA: Diagnosis not present

## 2014-07-09 DIAGNOSIS — Z7982 Long term (current) use of aspirin: Secondary | ICD-10-CM | POA: Insufficient documentation

## 2014-07-09 DIAGNOSIS — H269 Unspecified cataract: Secondary | ICD-10-CM | POA: Insufficient documentation

## 2014-07-09 DIAGNOSIS — Z9889 Other specified postprocedural states: Secondary | ICD-10-CM | POA: Diagnosis not present

## 2014-07-09 DIAGNOSIS — Z8672 Personal history of thrombophlebitis: Secondary | ICD-10-CM | POA: Insufficient documentation

## 2014-07-09 DIAGNOSIS — I714 Abdominal aortic aneurysm, without rupture, unspecified: Secondary | ICD-10-CM | POA: Insufficient documentation

## 2014-07-09 DIAGNOSIS — I4891 Unspecified atrial fibrillation: Secondary | ICD-10-CM | POA: Insufficient documentation

## 2014-07-09 DIAGNOSIS — I252 Old myocardial infarction: Secondary | ICD-10-CM | POA: Diagnosis not present

## 2014-07-09 DIAGNOSIS — R109 Unspecified abdominal pain: Secondary | ICD-10-CM | POA: Diagnosis not present

## 2014-07-09 DIAGNOSIS — Z951 Presence of aortocoronary bypass graft: Secondary | ICD-10-CM | POA: Diagnosis not present

## 2014-07-09 DIAGNOSIS — M545 Low back pain, unspecified: Secondary | ICD-10-CM | POA: Diagnosis not present

## 2014-07-09 DIAGNOSIS — E119 Type 2 diabetes mellitus without complications: Secondary | ICD-10-CM | POA: Insufficient documentation

## 2014-07-09 DIAGNOSIS — Z79899 Other long term (current) drug therapy: Secondary | ICD-10-CM | POA: Diagnosis not present

## 2014-07-09 DIAGNOSIS — Z7901 Long term (current) use of anticoagulants: Secondary | ICD-10-CM | POA: Diagnosis not present

## 2014-07-09 DIAGNOSIS — I2510 Atherosclerotic heart disease of native coronary artery without angina pectoris: Secondary | ICD-10-CM | POA: Diagnosis not present

## 2014-07-09 DIAGNOSIS — Z87442 Personal history of urinary calculi: Secondary | ICD-10-CM | POA: Insufficient documentation

## 2014-07-09 DIAGNOSIS — I10 Essential (primary) hypertension: Secondary | ICD-10-CM | POA: Diagnosis not present

## 2014-07-09 DIAGNOSIS — E785 Hyperlipidemia, unspecified: Secondary | ICD-10-CM | POA: Diagnosis not present

## 2014-07-09 DIAGNOSIS — Z8709 Personal history of other diseases of the respiratory system: Secondary | ICD-10-CM | POA: Diagnosis not present

## 2014-07-09 DIAGNOSIS — Z8673 Personal history of transient ischemic attack (TIA), and cerebral infarction without residual deficits: Secondary | ICD-10-CM | POA: Insufficient documentation

## 2014-07-09 DIAGNOSIS — I7409 Other arterial embolism and thrombosis of abdominal aorta: Secondary | ICD-10-CM | POA: Diagnosis not present

## 2014-07-09 DIAGNOSIS — Z8619 Personal history of other infectious and parasitic diseases: Secondary | ICD-10-CM | POA: Diagnosis not present

## 2014-07-09 DIAGNOSIS — I7411 Embolism and thrombosis of thoracic aorta: Secondary | ICD-10-CM | POA: Diagnosis not present

## 2014-07-09 DIAGNOSIS — J9819 Other pulmonary collapse: Secondary | ICD-10-CM | POA: Diagnosis not present

## 2014-07-09 DIAGNOSIS — R3989 Other symptoms and signs involving the genitourinary system: Secondary | ICD-10-CM | POA: Diagnosis not present

## 2014-07-09 LAB — CBC WITH DIFFERENTIAL/PLATELET
Basophils Absolute: 0 10*3/uL (ref 0.0–0.1)
Basophils Relative: 1 % (ref 0–1)
Eosinophils Absolute: 0.1 10*3/uL (ref 0.0–0.7)
Eosinophils Relative: 2 % (ref 0–5)
HEMATOCRIT: 33.1 % — AB (ref 36.0–46.0)
HEMOGLOBIN: 10.6 g/dL — AB (ref 12.0–15.0)
LYMPHS PCT: 23 % (ref 12–46)
Lymphs Abs: 1 10*3/uL (ref 0.7–4.0)
MCH: 23.9 pg — ABNORMAL LOW (ref 26.0–34.0)
MCHC: 32 g/dL (ref 30.0–36.0)
MCV: 74.7 fL — ABNORMAL LOW (ref 78.0–100.0)
MONO ABS: 0.6 10*3/uL (ref 0.1–1.0)
MONOS PCT: 14 % — AB (ref 3–12)
NEUTROS ABS: 2.6 10*3/uL (ref 1.7–7.7)
Neutrophils Relative %: 60 % (ref 43–77)
Platelets: 200 10*3/uL (ref 150–400)
RBC: 4.43 MIL/uL (ref 3.87–5.11)
RDW: 17.2 % — ABNORMAL HIGH (ref 11.5–15.5)
WBC: 4.4 10*3/uL (ref 4.0–10.5)

## 2014-07-09 LAB — URINALYSIS, ROUTINE W REFLEX MICROSCOPIC
BILIRUBIN URINE: NEGATIVE
GLUCOSE, UA: NEGATIVE mg/dL
Hgb urine dipstick: NEGATIVE
Ketones, ur: NEGATIVE mg/dL
Nitrite: NEGATIVE
PROTEIN: NEGATIVE mg/dL
Specific Gravity, Urine: 1.01 (ref 1.005–1.030)
Urobilinogen, UA: 1 mg/dL (ref 0.0–1.0)
pH: 7 (ref 5.0–8.0)

## 2014-07-09 LAB — I-STAT CG4 LACTIC ACID, ED
LACTIC ACID, VENOUS: 0.71 mmol/L (ref 0.5–2.2)
Lactic Acid, Venous: 1.39 mmol/L (ref 0.5–2.2)

## 2014-07-09 LAB — COMPREHENSIVE METABOLIC PANEL
ALK PHOS: 59 U/L (ref 39–117)
ALT: 10 U/L (ref 0–35)
AST: 21 U/L (ref 0–37)
Albumin: 3.9 g/dL (ref 3.5–5.2)
Anion gap: 13 (ref 5–15)
BUN: 20 mg/dL (ref 6–23)
CHLORIDE: 98 meq/L (ref 96–112)
CO2: 26 mEq/L (ref 19–32)
CREATININE: 1 mg/dL (ref 0.50–1.10)
Calcium: 9.5 mg/dL (ref 8.4–10.5)
GFR calc Af Amer: 62 mL/min — ABNORMAL LOW (ref >90)
GFR, EST NON AFRICAN AMERICAN: 54 mL/min — AB (ref >90)
Glucose, Bld: 108 mg/dL — ABNORMAL HIGH (ref 70–99)
Potassium: 4.4 mEq/L (ref 3.7–5.3)
Sodium: 137 mEq/L (ref 137–147)
Total Bilirubin: 0.8 mg/dL (ref 0.3–1.2)
Total Protein: 7.4 g/dL (ref 6.0–8.3)

## 2014-07-09 LAB — URINE MICROSCOPIC-ADD ON

## 2014-07-09 LAB — TROPONIN I: Troponin I: <0.3 ng/mL (ref <0.30)

## 2014-07-09 MED ORDER — HYDROMORPHONE HCL PF 1 MG/ML IJ SOLN
1.0000 mg | Freq: Once | INTRAMUSCULAR | Status: AC
  Administered 2014-07-09: 1 mg via INTRAVENOUS
  Filled 2014-07-09: qty 1

## 2014-07-09 MED ORDER — SODIUM CHLORIDE 0.9 % IV BOLUS (SEPSIS)
1000.0000 mL | Freq: Once | INTRAVENOUS | Status: AC
  Administered 2014-07-09: 1000 mL via INTRAVENOUS

## 2014-07-09 MED ORDER — OXYCODONE-ACETAMINOPHEN 5-325 MG PO TABS: 1.0000 | ORAL_TABLET | ORAL | Status: DC | PRN

## 2014-07-09 MED ORDER — IOHEXOL 350 MG/ML SOLN
100.0000 mL | Freq: Once | INTRAVENOUS | Status: AC | PRN
  Administered 2014-07-09: 100 mL via INTRAVENOUS

## 2014-07-09 MED ORDER — OXYCODONE-ACETAMINOPHEN 5-325 MG PO TABS
1.0000 | ORAL_TABLET | Freq: Once | ORAL | Status: AC
  Administered 2014-07-09: 1 via ORAL
  Filled 2014-07-09: qty 1

## 2014-07-09 MED ORDER — LABETALOL HCL 5 MG/ML IV SOLN
10.0000 mg | Freq: Once | INTRAVENOUS | Status: AC
  Administered 2014-07-09: 10 mg via INTRAVENOUS
  Filled 2014-07-09: qty 4

## 2014-07-09 NOTE — Discharge Instructions (Signed)
You have a 3.8 cm aneursym that needs follow up by vascular surgery Abdominal Aortic Aneurysm An aneurysm is a weakened or damaged part of an artery wall that bulges from the normal force of blood pumping through the body. An abdominal aortic aneurysm is an aneurysm that occurs in the lower part of the aorta, the main artery of the body.  The major concern with an abdominal aortic aneurysm is that it can enlarge and burst (rupture) or blood can flow between the layers of the wall of the aorta through a tear (aorticdissection). Both of these conditions can cause bleeding inside the body and can be life threatening unless diagnosed and treated promptly. CAUSES  The exact cause of an abdominal aortic aneurysm is unknown. Some contributing factors are:   A hardening of the arteries caused by the buildup of fat and other substances in the lining of a blood vessel (arteriosclerosis).  Inflammation of the walls of an artery (arteritis).   Connective tissue diseases, such as Marfan syndrome.   Abdominal trauma.   An infection, such as syphilis or staphylococcus, in the wall of the aorta (infectious aortitis) caused by bacteria. RISK FACTORS  Risk factors that contribute to an abdominal aortic aneurysm may include:  Age older than 87 years.   High blood pressure (hypertension).  Female gender.  Ethnicity (white race).  Obesity.  Family history of aneurysm (first degree relatives only).  Tobacco use. PREVENTION  The following healthy lifestyle habits may help decrease your risk of abdominal aortic aneurysm:  Quitting smoking. Smoking can raise your blood pressure and cause arteriosclerosis.  Limiting or avoiding alcohol.  Keeping your blood pressure, blood sugar level, and cholesterol levels within normal limits.  Decreasing your salt intake. In somepeople, too much salt can raise blood pressure and increase your risk of abdominal aortic aneurysm.  Eating a diet low in  saturated fats and cholesterol.  Increasing your fiber intake by including whole grains, vegetables, and fruits in your diet. Eating these foods may help lower blood pressure.  Maintaining a healthy weight.  Staying physically active and exercising regularly. SYMPTOMS  The symptoms of abdominal aortic aneurysm may vary depending on the size and rate of growth of the aneurysm.Most grow slowly and do not have any symptoms. When symptoms do occur, they may include:  Pain (abdomen, side, lower back, or groin). The pain may vary in intensity. A sudden onset of severe pain may indicate that the aneurysm has ruptured.  Feeling full after eating only small amounts of food.  Nausea or vomiting or both.  Feeling a pulsating lump in the abdomen.  Feeling faint or passing out. DIAGNOSIS  Since most unruptured abdominal aortic aneurysms have no symptoms, they are often discovered during diagnostic exams for other conditions. An aneurysm may be found during the following procedures:  Ultrasonography (A one-time screening for abdominal aortic aneurysm by ultrasonography is also recommended for all men aged 75-75 years who have ever smoked).  X-ray exams.  A computed tomography (CT).  Magnetic resonance imaging (MRI).  Angiography or arteriography. TREATMENT  Treatment of an abdominal aortic aneurysm depends on the size of your aneurysm, your age, and risk factors for rupture. Medication to control blood pressure and pain may be used to manage aneurysms smaller than 6 cm. Regular monitoring for enlargement may be recommended by your caregiver if:  The aneurysm is 3-4 cm in size (an annual ultrasonography may be recommended).  The aneurysm is 4-4.5 cm in size (an ultrasonography every 6  months may be recommended).  The aneurysm is larger than 4.5 cm in size (your caregiver may ask that you be examined by a vascular surgeon). If your aneurysm is larger than 6 cm, surgical repair may be  recommended. There are two main methods for repair of an aneurysm:   Endovascular repair (a minimally invasive surgery). This is done most often.  Open repair. This method is used if an endovascular repair is not possible. Document Released: 08/01/2005 Document Revised: 02/16/2013 Document Reviewed: 11/21/2012 [facility] Patient Information 2015 [place], [place]. This information is not intended to replace advice given to you by your health care provider. Make sure you discuss any questions you have with your health care provider. r. Back Pain, Adult Low back pain is very common. About 1 in 5 people have back pain.The cause of low back pain is rarely dangerous. The pain often gets better over time.About half of people with a sudden onset of back pain feel better in just 2 weeks. About 8 in 10 people feel better by 6 weeks.  CAUSES Some common causes of back pain include:  Strain of the muscles or ligaments supporting the spine.  Wear and tear (degeneration) of the spinal discs.  Arthritis.  Direct injury to the back. DIAGNOSIS Most of the time, the direct cause of low back pain is not known.However, back pain can be treated effectively even when the exact cause of the pain is unknown.Answering your caregiver's questions about your overall health and symptoms is one of the most accurate ways to make sure the cause of your pain is not dangerous. If your caregiver needs more information, he or she may order lab work or imaging tests (X-rays or MRIs).However, even if imaging tests show changes in your back, this usually does not require surgery. HOME CARE INSTRUCTIONS For many people, back pain returns.Since low back pain is rarely dangerous, it is often a condition that people can learn to [facility] their own.   Remain active. It is stressful on the back to sit or stand in one place. Do not sit, drive, or stand in one place for more than 30 minutes at a time. Take short walks on level  surfaces as soon as pain allows.Try to increase the length of time you walk each day.  Do not stay in bed.Resting more than 1 or 2 days can delay your recovery.  Do not avoid exercise or work.Your body is made to move.It is not dangerous to be active, even though your back may hurt.Your back will likely heal faster if you return to being active before your pain is gone.  Pay attention to your body when you bend and lift. Many people have less discomfortwhen lifting if they bend their knees, keep the load close to their bodies,and avoid twisting. Often, the most comfortable positions are those that put less stress on your recovering back.  Find a comfortable position to sleep. Use a firm mattress and lie on your side with your knees slightly bent. If you lie on your back, put a pillow under your knees.  Only take over-the-counter or prescription medicines as directed by your caregiver. Over-the-counter medicines to reduce pain and inflammation are often the most helpful.Your caregiver may prescribe muscle relaxant drugs.These medicines help dull your pain so you can more quickly return to your normal activities and healthy exercise.  Put ice on the injured area.  Put ice in a plastic bag.  Place a towel between your skin and the bag.  Leave the ice on for 15-20 minutes, 03-04 times a day for the first 2 to 3 days. After that, ice and heat may be alternated to reduce pain and spasms.  Ask your caregiver about trying back exercises and gentle massage. This may be of some benefit.  Avoid feeling anxious or stressed.Stress increases muscle tension and can worsen back pain.It is important to recognize when you are anxious or stressed and learn ways to manage it.Exercise is a great option. SEEK MEDICAL CARE IF:  You have pain that is not relieved with rest or medicine.  You have pain that does not improve in 1 week.  You have new symptoms.  You are generally not feeling  well. SEEK IMMEDIATE MEDICAL CARE IF:   You have pain that radiates from your back into your legs.  You develop new bowel or bladder control problems.  You have unusual weakness or numbness in your arms or legs.  You develop nausea or vomiting.  You develop abdominal pain.  You feel faint. Document Released: 10/22/2005 Document Revised: 04/22/2012 Document Reviewed: 02/23/2014 [facility] Patient Information 2015 [place], [place]. This information is not intended to replace advice given to you by your health care provider. Make sure you discuss any questions you have with your health care provider.

## 2014-07-09 NOTE — ED Notes (Signed)
Patient transported to CT 

## 2014-07-09 NOTE — ED Notes (Signed)
Patient transported to X-ray 

## 2014-07-09 NOTE — ED Provider Notes (Signed)
CSN: [id number]     Arrival date & time 07/09/14  4132 History   First MD Initiated Contact with Patient 07/09/14 [phone]     Chief Complaint  Patient presents with  . Shoulder Pain  . Back Pain     (Consider location/radiation/quality/duration/timing/severity/associated sxs/prior Treatment) HPI Comments: Pt comes in with cc of left shoulder blade pain, that is radiating to the left lower bacck. Pt has hx of CAD, s/p CABG, DM, Afib. Pt reports that she started having the pain 2 days ago. Pain is constant, and getting worse. There is no aggravating or relieving factors, and there is no numbness, tingling, weakness. Pt does have hx of renal stones, several years ago, no uti like sx. No pelvic disorders. Pain is getting severe, and now rating is 9/10.  Patient is a 73 y.o. female presenting with shoulder pain and back pain. The history is provided by the patient.  Shoulder Pain Pertinent negatives include no chest pain, no abdominal pain and no shortness of breath.  Back Pain Associated symptoms: no abdominal pain and no chest pain     Past Medical History  Diagnosis Date  . Arthritis   . Atrial fibrillation 02/21/2011  . Hypertension   . Hyperlipidemia   . Myocardial infarction   . Diabetes mellitus age 56  . CAD (coronary artery disease)     prior coronary stenting  . Stroke 2001  . Peripheral vascular disease   . Carotid artery disease 2001    s/p Bilateral CEA, after CVA  . TR (tricuspid regurgitation), severe 12/02/12 for repair with CABG 12/03/2012  . Pulmonary HTN 12/03/2012  . Dyspnea 03/13/2006    Myoview - EF 76%; mild ischemia in apical lateral region, less severe than previous study in 2002  . H/O endarterectomy 09/11/2012    patent L and R carotid sites, R 60-79% restenosed ICA; L <40% restenosed ICA  . Stenosis of artery 06/03/2012    doppler- most distal aspect of abd aorta no aneurysmal dilation; bilateral ABIs - mild L arterial insufficiency, L CIA narrowing w/ 50-69%  diameter reduction; L and R SFA both w/ 0-49% diameter reductions  . H/O endarterectomy 01/08/2012    doppler - R distal common carotid/proximal ICA stenosed 80-99%, L 0-39%  . Decreased pedal pulses 06/04/2011    doppler - bilateral ABIs no evidence of insufficiency; L CIA slightly elevated velocities 20-30% diameter reduction;   . Limb pain 05/02/2011    doppler - no evidence of thrombus of thrombophlebitis  . Diabetes   . H/O carotid endarterectomy 03/06/2012    CT angio -high grade stenosis of proximal R internal carotid w/ lg posterior ulceration or dissection flap; near occlusive stenosis at origin of nondominantproximal L vertebral artery; <50% stenosis of proximal R vertebral and proximal L subclavian artery  . Urinary tract infection, site not specified   . Tobacco use disorder   . Unspecified cataract   . Myalgia and myositis, unspecified   . Cervicalgia   . Acute upper respiratory infections of unspecified site   . Tobacco use disorder   . Peripheral vascular disease, unspecified   . Atrial fibrillation   . Phlebitis and thrombophlebitis of superficial vessels of lower extremities   . Edema   . Long term (current) use of anticoagulants   . Loss of weight   . Abdominal pain, epigastric   . Atrial fibrillation   . Benign paroxysmal positional vertigo   . Type II or unspecified type diabetes mellitus with peripheral circulatory disorders,  uncontrolled(250.72)   . Unspecified vitamin D deficiency   . Other manifestations of vitamin A deficiency   . Type II or unspecified type diabetes mellitus with peripheral circulatory disorders, not stated as uncontrolled(250.70)   . Primary localized osteoarthrosis, hand   . Abdominal pain, epigastric   . Nontoxic uninodular goiter   . Nonspecific abnormal results of liver function study   . Candidiasis of vulva and vagina   . Routine gynecological examination   . Unspecified disorder of iris and ciliary body   . Spinal stenosis, unspecified  region other than cervical   . Other malaise and fatigue   . Pain in joint, pelvic region and thigh   . Cervicalgia   . Other and unspecified hyperlipidemia   . Edema   . Coronary atherosclerosis of unspecified type of vessel, native or graft   . Pain in limb   . Osteoarthrosis, unspecified whether generalized or localized, unspecified site   . Occlusion and stenosis of carotid artery without mention of cerebral infarction   . Lumbago   . Myalgia and myositis, unspecified   . Renal artery stenosis    Past Surgical History  Procedure Laterality Date  . Carotid endarterectomy  2001    Bilateral  . Cholecystectomy    . Kidney stone surgery      Removal  . Back surgery  [phone]    three previous surgeries  . Angioplasty  1984 and 1982  . Abdominal hysterectomy  1975  . Percutaneous coronary stent intervention (pci-s)  2002    2 BMS in ostial/Prox & mid RCA (mid 2.5 mm 20x  mm, & 2.75 mm x  8 mm ostial)  . Coronary artery bypass graft  12/04/2012    Procedure: CORONARY ARTERY BYPASS GRAFTING (CABG);  Surgeon: Ivin Poot, MD;  Location: [facility];  Service: Open Heart Surgery;  Laterality: N/A;  . Intraoperative transesophageal echocardiogram  12/04/2012    Procedure: INTRAOPERATIVE TRANSESOPHAGEAL ECHOCARDIOGRAM;  Surgeon: Ivin Poot, MD;  Location: [facility];  Service: Open Heart Surgery;  Laterality: N/A;  . Appendectomy  1954  . Tee with cardioversion  01/23/2011    EF 61-60%; grade 2 diastolic dysfunction, elevated mean LA filling pressure, RV systolic pressure increased consistent w/ mod pulmonary hypertension  . Arch aortogram  03/30/2011    40% recurrent stenosis at origin of R proximal carotid patch, shelf like recurrent stenosis w/in midsection of patch that does not appear to be flow limiting; normal non-stenosed great vessel origins   Family History  Problem Relation Age of Onset  . Other Mother     CVA  . Diabetes Mother   . Heart disease Mother     Heart  disease before age 40  . Heart attack Mother   . Heart disease Father     Heart Disease before age 43  . Heart attack Father   . Heart disease Sister     Amputation  . Diabetes Sister   . Heart disease Brother     Heart disease before age 16  . Diabetes Brother   . Stroke Brother   . Stroke Brother   . Heart disease Brother   . Stroke Brother   . Heart disease Brother   . Heart disease Sister   . Heart disease Sister   . Diabetes Other   . Cancer Other     breast  . Heart disease Other     cad  . Other Other     cva  .  Diabetes Daughter   . Cancer Sister    History  Substance Use Topics  . Smoking status: Former Smoker -- 0.30 packs/day for 57 years    Types: Cigarettes    Quit date: 11/19/2012  . Smokeless tobacco: Never Used     Comment: pt states she is working on it but husband just passed  . Alcohol Use: 0.6 oz/week    1 Glasses of wine per week   OB History   Grav Para Term Preterm Abortions TAB SAB Ect Mult Living                 Review of Systems  Constitutional: Negative for activity change.  HENT: Negative for facial swelling.   Respiratory: Negative for cough, shortness of breath and wheezing.   Cardiovascular: Negative for chest pain.  Gastrointestinal: Negative for nausea, vomiting, abdominal pain, diarrhea, constipation, blood in stool and abdominal distention.  Genitourinary: Positive for flank pain. Negative for hematuria and difficulty urinating.  Musculoskeletal: Positive for arthralgias and back pain. Negative for gait problem and neck pain.  Skin: Negative for color change.  Neurological: Negative for speech difficulty.  Hematological: Does not bruise/bleed easily.  Psychiatric/Behavioral: Negative for confusion.      Allergies  Iron; Norvasc; Promethazine; Vicodin; and Vioxx  Home Medications   Prior to Admission medications   Medication Sig Start Date End Date Taking? Authorizing Provider  acetaminophen-codeine (TYLENOL #3)  300-30 MG per tablet Take 1 tablet by mouth every 6 (six) hours as needed for moderate pain.   Yes Historical Provider, MD  aspirin 81 MG tablet Take 81 mg by mouth daily.    Yes Historical Provider, MD  hydrALAZINE (APRESOLINE) 50 MG tablet Take 1.5 tablets (75 mg total) by mouth 3 (three) times daily. 03/15/14  Yes Brittainy Simmons, PA-C  latanoprost (XALATAN) 0.005 % ophthalmic solution Place 1 drop into both eyes at bedtime.   Yes Historical Provider, MD  metFORMIN (GLUCOPHAGE) 1000 MG tablet Take 1,000 mg by mouth 2 (two) times daily with a meal.   Yes Historical Provider, MD  metoprolol (LOPRESSOR) 50 MG tablet Take 50 mg by mouth 2 (two) times daily.   Yes Historical Provider, MD  nitroGLYCERIN (NITROSTAT) 0.4 MG SL tablet Place 0.4 mg under the tongue every 5 (five) minutes as needed for chest pain.   Yes Historical Provider, MD  rosuvastatin (CRESTOR) 40 MG tablet Take 40 mg by mouth daily.   Yes Historical Provider, MD  valsartan-hydrochlorothiazide (DIOVAN-HCT) 320-25 MG per tablet Take 1 tablet by mouth daily. For blood pressure. 01/22/14  Yes Tiffany L Reed, DO   BP 226/83  Pulse 78  Temp(Src) 98.4 F (36.9 C) (Oral)  Resp 20  Ht 5' 2.5" (1.588 m)  Wt 147 lb (66.679 kg)  BMI 26.44 kg/m2  SpO2 99% Physical Exam  Nursing note and vitals reviewed. Constitutional: She is oriented to person, place, and time. She appears well-developed and well-nourished.  HENT:  Head: Normocephalic and atraumatic.  Eyes: EOM are normal. Pupils are equal, round, and reactive to light.  Neck: Neck supple.  Cardiovascular: Normal rate, regular rhythm, normal heart sounds and intact distal pulses.   No murmur heard. Pulmonary/Chest: Effort normal. No respiratory distress.  Abdominal: Soft. She exhibits no distension. There is tenderness. There is no rebound and no guarding.  Pt has flank L tenderness.  Neurological: She is alert and oriented to person, place, and time.  Skin: Skin is warm and dry.     ED Course  Procedures (including critical care time) Labs Review Labs Reviewed  CBC WITH DIFFERENTIAL - Abnormal; Notable for the following:    Hemoglobin 10.6 (*)    HCT 33.1 (*)    MCV 74.7 (*)    MCH 23.9 (*)    RDW 17.2 (*)    Monocytes Relative 14 (*)    All other components within normal limits  COMPREHENSIVE METABOLIC PANEL - Abnormal; Notable for the following:    Glucose, Bld 108 (*)    GFR calc non Af Amer 54 (*)    GFR calc Af Amer 62 (*)    All other components within normal limits  URINALYSIS, ROUTINE W REFLEX MICROSCOPIC - Abnormal; Notable for the following:    APPearance CLOUDY (*)    Leukocytes, UA TRACE (*)    All other components within normal limits  URINE MICROSCOPIC-ADD ON - Abnormal; Notable for the following:    Squamous Epithelial / LPF MANY (*)    Bacteria, UA FEW (*)    All other components within normal limits  URINE CULTURE  TROPONIN I  TROPONIN I  I-STAT CG4 LACTIC ACID, ED  I-STAT CG4 LACTIC ACID, ED    Imaging Review Ct Abdomen Pelvis Wo Contrast  07/09/2014   ADDENDUM REPORT: 07/09/2014 16:25  ADDENDUM: The case was discussed by telephone with Dr. Kathrynn Humble. Given the patient's symptom complex and normal renal function, a CTA of the chest and abdomen and pelvis will be performed to more fully assess the visualized portions of the thoracic aorta as well as the distal abdominal aorta for possible dissection.   Electronically Signed   By: David  Martinique   On: 07/09/2014 16:25   07/09/2014   CLINICAL DATA:  Left lower leg pain extending into the left back, decreased urinary stream volume  EXAM: CT ABDOMEN AND PELVIS WITHOUT CONTRAST  TECHNIQUE: Multidetector CT imaging of the abdomen and pelvis was performed following the standard protocol without IV contrast.  COMPARISON:  Acute abdominal series of today's date  FINDINGS: The abdominal aorta at the level of the aortic hiatus measured measures 3.3 cm AP x 3.9 cm transversely. At the level of the renal  arteries it measures 3.1 cm AP x 3.2 cm transversely. Just above the bifurcation the aorta measures 2.2 cm AP x 2.3 cm transversely. The common iliac arteries exhibit no aneurysm.  The liver, gallbladder, pancreas, spleen, partially distended stomach, adrenal glands, and kidneys are normal. The small and large bowel exhibit no evidence of ileus nor obstruction nor acute inflammation. The urinary bladder is normal. The uterus is surgically absent. There are no adnexal masses. There is no inguinal or significant umbilical hernia.  The lumbar vertebral bodies are preserved in height. There is disc space degenerative aggression disc space narrowing at L5-S1. The bony pelvis is unremarkable. The lung bases are clear.  IMPRESSION: 1. The thoracic aorta just distal to the aortic arch exhibits increased caliber measuring 4.3 cm AP x 4 cm transversely. Mural calcification is somewhat eccentrically positioned to the left posteriorly. More proximally one cannot comment upon the aorta. The abdominal aorta exhibits atherosclerotic calcification and mild aneurysmal dilation of its proximal and midportions. There is a normal tapering caliber. The maximal measured diameter is at the aortic hiatus and this is 3.9 cm in the transverse plane. There is no evidence of periaortic hemorrhage. 2. There is no acute hepatobiliary, urinary tract, or bowel abnormality. 3. Contrast enhanced CT scanning of the thorax with attention to the aorta would be useful given  the patient's symptoms.  Electronically Signed: By: David  Martinique On: 07/09/2014 14:52   Dg Abd Acute W/chest  07/09/2014   CLINICAL DATA:  Left shoulder blade pain, low back pain. Left flank pain.  EXAM: ACUTE ABDOMEN SERIES (ABDOMEN 2 VIEW & CHEST 1 VIEW)  COMPARISON:  11/12/2013.  Chest x-ray 02/18/2013  FINDINGS: Prior CABG. Cardiomegaly with tortuosity and ectasia of the thoracic aorta. No confluent opacities or effusions.  Nonobstructive bowel gas pattern. No organomegaly or  free air. No suspicious calcification. No acute bony abnormality.  IMPRESSION: Cardiomegaly.  No active cardiopulmonary disease.  No evidence of bowel obstruction or free air.   Electronically Signed   By: Rolm Baptise M.D.   On: 07/09/2014 10:09     EKG Interpretation   Date/Time:  Friday July 09 2014 09:31:46 EDT Ventricular Rate:  75 PR Interval:  182 QRS Duration: 77 QT Interval:  451 QTC Calculation: 504 R Axis:   43 Text Interpretation:  Sinus rhythm Nonspecific T abnormalities, lateral  leads Prolonged QT interval No acute changes Confirmed by Kathrynn Humble, MD,  Kylle Lall ([id number]) on 07/09/2014 9:44:05 AM      MDM   Final diagnoses:  None    Pt comes in with posterior - L shoulder and flank pain. Pain getting worse. Hx of CAD, s/p CABG - troponins are neg, EKG is normal.  Pt neuro and cardiovascular exam normal. UA is clean, all labs are WNL.  I went to discuss the findings, and pt tearful in pain. Ct abd non contrast ordered, i wanted the Rads to look specifically at the Aorta, and there certainly is Thoracic Art. Aneryms.  CT dissection ordered.     Varney Biles, MD 07/09/14 [id number]

## 2014-07-09 NOTE — ED Notes (Signed)
Pt ambulated well to the restroom with standby assist.

## 2014-07-09 NOTE — ED Notes (Signed)
Pt c/o pain from left shoulder blade down to left low back. Pt denies recent injury. Pt reports small stream when urinating. Pt denies N/v, reports not having an appetite.

## 2014-07-09 NOTE — ED Provider Notes (Signed)
Pt signed out by dr. Kathrynn Humble. Results reviewed with pt and referral to vascular surgery--will give pain meds  Leota Jacobsen, MD 07/09/14 2008

## 2014-07-10 LAB — URINE CULTURE: Colony Count: >=100000

## 2014-07-13 ENCOUNTER — Telehealth: Payer: Self-pay

## 2014-07-13 NOTE — Telephone Encounter (Signed)
Pt called to request an appt. to see Dr. Oneida Alar ASAP.   Stated she was seen in the ER on 9/4 for left shoulder and left lower back pain.  Is requesting to be seen this week.  Stated the ER doctor told her she has an Aortic aneurysm, and should f/u with Dr. Oneida Alar within the next week.  Per CTA chest/abd/pelvis: "Mild dilatation of the proximal aorta has slightly progressed, now measuring up to 3.8 cm transverse (3.4 cm previously)".  Advised pt. appt. will be scheduled, but unable to guarantee that she could be seen this week.  ER note stated to refer to Vasc. Surgery with no time frame noted.  Pt. stated she continues to have pain in her back, and was advised this could be related to the Aortic aneurysm.

## 2014-07-13 NOTE — Telephone Encounter (Signed)
Spoke with Kaysha to schedule appt for Thursday 07/15/14, dpm

## 2014-07-14 ENCOUNTER — Encounter: Payer: Self-pay | Admitting: Vascular Surgery

## 2014-07-15 ENCOUNTER — Ambulatory Visit (INDEPENDENT_AMBULATORY_CARE_PROVIDER_SITE_OTHER): Payer: Medicare Other | Admitting: Vascular Surgery

## 2014-07-15 ENCOUNTER — Telehealth: Payer: Self-pay | Admitting: Vascular Surgery

## 2014-07-15 ENCOUNTER — Encounter: Payer: Self-pay | Admitting: Vascular Surgery

## 2014-07-15 VITALS — BP 199/77 | HR 63 | Ht 62.5 in | Wt 146.2 lb

## 2014-07-15 DIAGNOSIS — I6529 Occlusion and stenosis of unspecified carotid artery: Secondary | ICD-10-CM

## 2014-07-15 DIAGNOSIS — I158 Other secondary hypertension: Secondary | ICD-10-CM

## 2014-07-15 DIAGNOSIS — I716 Thoracoabdominal aortic aneurysm, without rupture, unspecified: Secondary | ICD-10-CM | POA: Diagnosis not present

## 2014-07-15 NOTE — Telephone Encounter (Signed)
Spoke with patient. Appt with Dr. Carlota Raspberry on 07/21/14 at 3:15pm. Fu with [facility] with [facility] 01/13/15 - letter will be sent - kf

## 2014-07-15 NOTE — Progress Notes (Signed)
[facility] HISTORY AND PHYSICAL   History of Present Illness:  Patient is a 73 y.o. 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  She is on coumadin for afib.  His atherosclerotic risk factors remain afib, diabetes, elevated cholesterol, smoking, and coronary artery disease.  These are all currently stable and followed by her primary care physician.  she has had poor control of her blood pressure recently. She states it has been in the 151V to 616 systolic at home. In the ER and at our office recently he was in the 073X to 106 systolic.  She denies any new neurologic events including amaurosis, numbness, or weakness. Previously had a right carotid endarterectomy by Dr. Amedeo Plenty in 2001. She also had a previous left carotid endarterectomy and redo carotid endarterectomy in 2001. Initial carotid was done by Dr. Amedeo Plenty the redo with Dr. Donnetta Hutching.    She is sent for followup today for evaluation of a thoracoabdominal aneurysm. She recently presented to the emergency room complaining of pain in her left shoulder and back. She has had a previous back operations.  She also complains of occasional left anterior chest pain with radiation to the left side. She states this is been present since her coronary bypass grafting in 2014. She has had no symptoms of syncope or collapse. She was given Percocet for pain control by the emergency room.  Past Medical History  Diagnosis Date  . Arthritis   . Atrial fibrillation 02/21/2011  . Hypertension   . Hyperlipidemia   . Myocardial infarction   . Diabetes mellitus age 64  . CAD (coronary artery disease)     prior coronary stenting  . Stroke 2001  . Peripheral vascular disease   . Carotid artery disease 2001    s/p Bilateral CEA, after CVA  . TR (tricuspid regurgitation), severe 12/02/12 for repair with CABG 12/03/2012  . Pulmonary HTN 12/03/2012  . Dyspnea 03/13/2006    Myoview - EF 76%; mild ischemia in apical  lateral region, less severe than previous study in 2002  . H/O endarterectomy 09/11/2012    patent L and R carotid sites, R 60-79% restenosed ICA; L <40% restenosed ICA  . Stenosis of artery 06/03/2012    doppler- most distal aspect of abd aorta no aneurysmal dilation; bilateral ABIs - mild L arterial insufficiency, L CIA narrowing w/ 50-69% diameter reduction; L and R SFA both w/ 0-49% diameter reductions  . H/O endarterectomy 01/08/2012    doppler - R distal common carotid/proximal ICA stenosed 80-99%, L 0-39%  . Decreased pedal pulses 06/04/2011    doppler - bilateral ABIs no evidence of insufficiency; L CIA slightly elevated velocities 20-30% diameter reduction;   . Limb pain 05/02/2011    doppler - no evidence of thrombus of thrombophlebitis  . Diabetes   . H/O carotid endarterectomy 03/06/2012    CT angio -high grade stenosis of proximal R internal carotid w/ lg posterior ulceration or dissection flap; near occlusive stenosis at origin of nondominantproximal L vertebral artery; <50% stenosis of proximal R vertebral and proximal L subclavian artery  . Urinary tract infection, site not specified   . Tobacco use disorder   . Unspecified cataract   . Myalgia and myositis, unspecified   . Cervicalgia   . Acute upper respiratory infections of unspecified site   . Tobacco use disorder   . Peripheral vascular disease, unspecified   . Atrial fibrillation   . Phlebitis and thrombophlebitis of superficial vessels of  lower extremities   . Edema   . Long term (current) use of anticoagulants   . Loss of weight   . Abdominal pain, epigastric   . Atrial fibrillation   . Benign paroxysmal positional vertigo   . Type II or unspecified type diabetes mellitus with peripheral circulatory disorders, uncontrolled(250.72)   . Unspecified vitamin D deficiency   . Other manifestations of vitamin A deficiency   . Type II or unspecified type diabetes mellitus with peripheral circulatory disorders, not stated as  uncontrolled(250.70)   . Primary localized osteoarthrosis, hand   . Abdominal pain, epigastric   . Nontoxic uninodular goiter   . Nonspecific abnormal results of liver function study   . Candidiasis of vulva and vagina   . Routine gynecological examination   . Unspecified disorder of iris and ciliary body   . Spinal stenosis, unspecified region other than cervical   . Other malaise and fatigue   . Pain in joint, pelvic region and thigh   . Cervicalgia   . Other and unspecified hyperlipidemia   . Edema   . Coronary atherosclerosis of unspecified type of vessel, native or graft   . Pain in limb   . Osteoarthrosis, unspecified whether generalized or localized, unspecified site   . Occlusion and stenosis of carotid artery without mention of cerebral infarction   . Lumbago   . Myalgia and myositis, unspecified   . Renal artery stenosis     Past Surgical History  Procedure Laterality Date  . Carotid endarterectomy  2001    Bilateral  . Cholecystectomy    . Kidney stone surgery      Removal  . Back surgery  [phone]    three previous surgeries  . Angioplasty  1984 and 1982  . Abdominal hysterectomy  1975  . Percutaneous coronary stent intervention (pci-s)  2002    2 BMS in ostial/Prox & mid RCA (mid 2.5 mm 20x  mm, & 2.75 mm x  8 mm ostial)  . Coronary artery bypass graft  12/04/2012    Procedure: CORONARY ARTERY BYPASS GRAFTING (CABG);  Surgeon: Ivin Poot, MD;  Location: [facility];  Service: Open Heart Surgery;  Laterality: N/A;  . Intraoperative transesophageal echocardiogram  12/04/2012    Procedure: INTRAOPERATIVE TRANSESOPHAGEAL ECHOCARDIOGRAM;  Surgeon: Ivin Poot, MD;  Location: [facility];  Service: Open Heart Surgery;  Laterality: N/A;  . Appendectomy  1954  . Tee with cardioversion  01/23/2011    EF 38-10%; grade 2 diastolic dysfunction, elevated mean LA filling pressure, RV systolic pressure increased consistent w/ mod pulmonary hypertension  . Arch aortogram   03/30/2011    40% recurrent stenosis at origin of R proximal carotid patch, shelf like recurrent stenosis w/in midsection of patch that does not appear to be flow limiting; normal non-stenosed great vessel origins    Social History History  Substance Use Topics  . Smoking status: Former Smoker -- 0.30 packs/day for 57 years    Types: Cigarettes    Quit date: 11/19/2012  . Smokeless tobacco: Never Used     Comment: pt states she is working on it but husband just passed  . Alcohol Use: 0.6 oz/week    1 Glasses of wine per week    Family History Family History  Problem Relation Age of Onset  . Other Mother     CVA  . Diabetes Mother   . Heart disease Mother     Heart disease before age 44  . Heart attack Mother   .  Heart disease Father     Heart Disease before age 2  . Heart attack Father   . Heart disease Sister     Amputation  . Diabetes Sister   . Heart disease Brother     Heart disease before age 48  . Diabetes Brother   . Stroke Brother   . Stroke Brother   . Heart disease Brother   . Stroke Brother   . Heart disease Brother   . Heart disease Sister   . Heart disease Sister   . Diabetes Other   . Cancer Other     breast  . Heart disease Other     cad  . Other Other     cva  . Diabetes Daughter   . Cancer Sister     Allergies  Allergies  Allergen Reactions  . Iron Hives  . Norvasc [Amlodipine Besylate] Nausea And Vomiting  . Promethazine Other (See Comments)    hallucinations  . Vicodin [Hydrocodone-Acetaminophen] Other (See Comments)    Caused arrythmia   . Vioxx [Rofecoxib] Nausea And Vomiting     Current Outpatient Prescriptions  Medication Sig Dispense Refill  . acetaminophen-codeine (TYLENOL #3) 300-30 MG per tablet Take 1 tablet by mouth every 6 (six) hours as needed for moderate pain.      [name] aspirin 81 MG tablet Take 81 mg by mouth daily.       . hydrALAZINE (APRESOLINE) 50 MG tablet Take 1.5 tablets (75 mg total) by mouth 3 (three) times  daily.  135 tablet  3  . latanoprost (XALATAN) 0.005 % ophthalmic solution Place 1 drop into both eyes at bedtime.      . metFORMIN (GLUCOPHAGE) 1000 MG tablet Take 1,000 mg by mouth 2 (two) times daily with a meal.      . metoprolol (LOPRESSOR) 50 MG tablet Take 50 mg by mouth 2 (two) times daily.      . nitroGLYCERIN (NITROSTAT) 0.4 MG SL tablet Place 0.4 mg under the tongue every 5 (five) minutes as needed for chest pain.      [name] oxyCODONE-acetaminophen (PERCOCET/ROXICET) 5-325 MG per tablet Take 1-2 tablets by mouth every 4 (four) hours as needed for severe pain.  15 tablet  0  . rosuvastatin (CRESTOR) 40 MG tablet Take 40 mg by mouth daily.      . valsartan-hydrochlorothiazide (DIOVAN-HCT) 320-25 MG per tablet Take 1 tablet by mouth daily. For blood pressure.  90 tablet  3   No current facility-administered medications for this visit.    ROS:   General:  No weight loss, Fever, chills  HEENT: No recent headaches, no nasal bleeding, no visual changes, no sore throat  Neurologic: No dizziness, blackouts, seizures. No recent symptoms of stroke or mini- stroke. No recent episodes of slurred speech, or temporary blindness.  Cardiac: + recent episodes of chest pain/pressure, no shortness of breath at rest.  No shortness of breath with exertion.  +history of atrial fibrillation or irregular heartbeat  Vascular: No history of rest pain in feet.  No history of claudication.  No history of non-healing ulcer, No history of DVT   Pulmonary: No home oxygen, no productive cough, no hemoptysis,  No asthma or wheezing  Musculoskeletal:  [ ]  Arthritis, [ ]  Low back pain,  [ ]  Joint pain  Hematologic:No history of hypercoagulable state.  No history of easy bleeding.  No history of anemia  Gastrointestinal: No hematochezia or melena,  No gastroesophageal reflux, no trouble swallowing  Urinary: [ ]   chronic Kidney disease, [ ]  on HD - [ ]  MWF or [ ]  TTHS, [ ]  Burning with urination, [ ]  Frequent  urination, [ ]  Difficulty urinating;   Skin: No rashes  Psychological: No history of anxiety,  No history of depression   Physical Examination  Filed Vitals:   07/15/14 0833  BP: 199/77  Pulse: 63  Height: 5' 2.5" (1.588 m)  Weight: 146 lb 3.2 oz (66.316 kg)  SpO2: 100%    Body mass index is 26.3 kg/(m^2).  General:  Alert and oriented, no acute distress HEENT: Normal Neck: No bruit or JVD Pulmonary: Clear to auscultation bilaterally Cardiac: Regular Rate and Rhythm without 2/6 diastolic murmur Abdomen: Soft, non-tender, non-distended, no mass Skin: No rash Extremity Pulses:  2+ radial, brachial, femoral, dorsalis pedis, pulses bilaterally Musculoskeletal: No deformity or edema  Neurologic: Upper and lower extremity motor 5/5 and symmetric  DATA:  CT angiogram from 07/09/2014 is reviewed.  This is compared to a CT scan of the chest performed in January 2014. Descending thoracic and upper abdominal aorta was 3.4 cm in January 2014. This area of the aorta now measures 4 cm. The aneurysm begins at the level of the left subclavian artery and extends to the peri-renal segment making this a type II thoracoabdominal aneurysm.     ASSESSMENT: Type II thoracoabdominal aneurysm with 8 mm of growth in the last 9 months. Repair of type II thoracoabdominal aneurysm is associated with significant morbidity mortality and risk of paraplegia. We would consider repair of this at 6 cm or greater. I'm not sure whether her shoulder pain is associated with this or not. She has complained of some anterior chest and shoulder pain in the past. She states this is different. Her CT scan is not worrisome for rapid expansion or rupture. However, her blood pressure is very poorly controlled and this will certainly put her at risk of continued dilation of her aorta over time.   PLAN:  #1 followup Dr. Nyoka Cowden in the near future to see if we can get some improved blood pressure control. Ideally systolic pressure  less than 140 preferably 120.  #2 followup CT Angio chest abdomen pelvis in 6 months.  Ruta Hinds, MD [facility] Office: [phone] Pager: [phone]

## 2014-07-15 NOTE — Addendum Note (Signed)
Addended by: Mena Goes on: 07/15/2014 05:29 PM   Modules accepted: Orders

## 2014-07-16 ENCOUNTER — Telehealth: Payer: Self-pay | Admitting: *Deleted

## 2014-07-16 MED ORDER — CARVEDILOL 25 MG PO TABS: 25.0000 mg | ORAL_TABLET | Freq: Two times a day (BID) | ORAL | Status: DC

## 2014-07-16 NOTE — Telephone Encounter (Signed)
Message copied by [name] on Fri Jul 16, 2014  8:08 AM ------      Message from: [name]      Created: Thu Jul 15, 2014 12:53 PM       Her BP was high in Dr. Oneida Alar office.       Let's try replacing metoprolol with carvedilol 25 mg BID and f/u in office in 3-4 weeks, please      [name] ------

## 2014-07-16 NOTE — Telephone Encounter (Signed)
Patient notified to stop Metoprolol and start Carvedilol 25mg  twice a day.  Rx sent to local pharmacy.  She already has an appt 08/02/14.  Patient verbalized understanding of med changes.

## 2014-07-21 ENCOUNTER — Ambulatory Visit (INDEPENDENT_AMBULATORY_CARE_PROVIDER_SITE_OTHER): Payer: Medicare Other | Admitting: Internal Medicine

## 2014-07-21 VITALS — BP 190/100 | HR 63 | Temp 98.0°F | Resp 18 | Ht 62.5 in | Wt 146.4 lb

## 2014-07-21 DIAGNOSIS — E1129 Type 2 diabetes mellitus with other diabetic kidney complication: Secondary | ICD-10-CM | POA: Diagnosis not present

## 2014-07-21 DIAGNOSIS — I714 Abdominal aortic aneurysm, without rupture, unspecified: Secondary | ICD-10-CM | POA: Insufficient documentation

## 2014-07-21 DIAGNOSIS — I6529 Occlusion and stenosis of unspecified carotid artery: Secondary | ICD-10-CM

## 2014-07-21 DIAGNOSIS — I10 Essential (primary) hypertension: Secondary | ICD-10-CM | POA: Diagnosis not present

## 2014-07-21 MED ORDER — CLONIDINE HCL 0.1 MG PO TABS: 0.1000 mg | ORAL_TABLET | Freq: Three times a day (TID) | ORAL | Status: DC

## 2014-07-21 NOTE — Progress Notes (Signed)
Patient ID: Kendra Todd, female   DOB: 14-Jul-1941, 73 y.o.   MRN: [id number]    Location:    [facility]  Place of Service:  OFFICE    Allergies  Allergen Reactions  . Iron Hives  . Norvasc [Amlodipine Besylate] Nausea And Vomiting  . Promethazine Other (See Comments)    hallucinations  . Vicodin [Hydrocodone-Acetaminophen] Other (See Comments)    Caused arrythmia   . Vioxx [Rofecoxib] Nausea And Vomiting    Chief Complaint  Patient presents with  . Medical Management of Chronic Issues    high BP x1 week,    HPI:  Seen in the ER on 07/09/14 for pain in the shoulder blade and back.  Multiple xrays were done. There was mild aneurysmal dilation of the thoracic aorta and abd aorta. Mural calcifications were noted in the aorta.  Pains improved and she was released to home. She continues to feel OK since then.  BP has been high recently. Up to 190/100 today.  Medications: Patient's Medications  New Prescriptions   No medications on file  Previous Medications   ACETAMINOPHEN-CODEINE (TYLENOL #3) 300-30 MG PER TABLET    Take 1 tablet by mouth every 6 (six) hours as needed for moderate pain.   ASPIRIN 81 MG TABLET    Take 81 mg by mouth daily.    CARVEDILOL (COREG) 25 MG TABLET    Take 1 tablet (25 mg total) by mouth 2 (two) times daily.   HYDRALAZINE (APRESOLINE) 50 MG TABLET    Take 1.5 tablets (75 mg total) by mouth 3 (three) times daily.   LATANOPROST (XALATAN) 0.005 % OPHTHALMIC SOLUTION    Place 1 drop into both eyes at bedtime.   METFORMIN (GLUCOPHAGE) 1000 MG TABLET    Take 1,000 mg by mouth 2 (two) times daily with a meal.   NITROGLYCERIN (NITROSTAT) 0.4 MG SL TABLET    Place 0.4 mg under the tongue every 5 (five) minutes as needed for chest pain.   OXYCODONE-ACETAMINOPHEN (PERCOCET/ROXICET) 5-325 MG PER TABLET    Take 1-2 tablets by mouth every 4 (four) hours as needed for severe pain.   ROSUVASTATIN (CRESTOR) 40 MG TABLET    Take 40 mg by mouth daily.   VALSARTAN-HYDROCHLOROTHIAZIDE (DIOVAN-HCT) 320-25 MG PER TABLET    Take 1 tablet by mouth daily. For blood pressure.  Modified Medications   No medications on file  Discontinued Medications   No medications on file     Review of Systems  Constitutional: Negative.   HENT: Negative.   Eyes: Negative.   Respiratory: Negative.   Cardiovascular: Negative for chest pain, palpitations and leg swelling.  Endocrine: Negative for polydipsia, polyphagia and polyuria.       History diabetes  Genitourinary: Positive for urgency and frequency. Negative for flank pain.  Musculoskeletal: Positive for arthralgias.  Skin: Negative.   Neurological: Negative.   Hematological: Negative.   Psychiatric/Behavioral: Negative.     Filed Vitals:   07/21/14 1609  BP: 190/100  Pulse: 63  Temp: 98 F (36.7 C)  TempSrc: Oral  Resp: 18  Height: 5' 2.5" (1.588 m)  Weight: 146 lb 6.4 oz (66.407 kg)  SpO2: 99%   Body mass index is 26.33 kg/(m^2).  Physical Exam  Constitutional: She is oriented to person, place, and time. She appears well-developed and well-nourished. No distress.  HENT:  Head: Normocephalic.  Nose: Nose normal.  Upper and lower dentures present.  Eyes: Conjunctivae are normal. Pupils are equal, round, and reactive to light.  Wears prescription lenses. Right cataract.  Neck: Normal range of motion. Neck supple. No JVD present. No tracheal deviation present. No thyromegaly present.  Cardiovascular: Normal rate, regular rhythm and normal heart sounds.  Exam reveals no gallop and no friction rub.   No murmur heard. Diminished dorsalis pedis pulses bilaterally and diminished posterior tibial pulses bilaterally.  Pulmonary/Chest: Effort normal and breath sounds normal. No respiratory distress. She has no wheezes. She has no rales. She exhibits no tenderness.  Abdominal: Soft. Bowel sounds are normal. She exhibits no distension and no mass. There is no tenderness.  Musculoskeletal: She  exhibits no edema and no tenderness.  Lymphadenopathy:    She has no cervical adenopathy.  Neurological: She is alert and oriented to person, place, and time. She has normal reflexes. No cranial nerve deficit. Coordination normal.  Skin: Skin is warm and dry. No rash noted. No erythema. No pallor.  Psychiatric: She has a normal mood and affect. Her behavior is normal. Judgment normal.     Labs reviewed: Admission on 07/09/2014, Discharged on 07/09/2014  Component Date Value Ref Range Status  . WBC 07/09/2014 4.4  4.0 - 10.5 K/uL Final  . RBC 07/09/2014 4.43  3.87 - 5.11 MIL/uL Final  . Hemoglobin 07/09/2014 10.6* 12.0 - 15.0 g/dL Final  . HCT 07/09/2014 33.1* 36.0 - 46.0 % Final  . MCV 07/09/2014 74.7* 78.0 - 100.0 fL Final  . MCH 07/09/2014 23.9* 26.0 - 34.0 pg Final  . MCHC 07/09/2014 32.0  30.0 - 36.0 g/dL Final  . RDW 07/09/2014 17.2* 11.5 - 15.5 % Final  . Platelets 07/09/2014 200  150 - 400 K/uL Final  . Neutrophils Relative % 07/09/2014 60  43 - 77 % Final  . Neutro Abs 07/09/2014 2.6  1.7 - 7.7 K/uL Final  . Lymphocytes Relative 07/09/2014 23  12 - 46 % Final  . Lymphs Abs 07/09/2014 1.0  0.7 - 4.0 K/uL Final  . Monocytes Relative 07/09/2014 14* 3 - 12 % Final  . Monocytes Absolute 07/09/2014 0.6  0.1 - 1.0 K/uL Final  . Eosinophils Relative 07/09/2014 2  0 - 5 % Final  . Eosinophils Absolute 07/09/2014 0.1  0.0 - 0.7 K/uL Final  . Basophils Relative 07/09/2014 1  0 - 1 % Final  . Basophils Absolute 07/09/2014 0.0  0.0 - 0.1 K/uL Final  . Sodium 07/09/2014 137  137 - 147 mEq/L Final  . Potassium 07/09/2014 4.4  3.7 - 5.3 mEq/L Final  . Chloride 07/09/2014 98  96 - 112 mEq/L Final  . CO2 07/09/2014 26  19 - 32 mEq/L Final  . Glucose, Bld 07/09/2014 108* 70 - 99 mg/dL Final  . BUN 07/09/2014 20  6 - 23 mg/dL Final  . Creatinine, Ser 07/09/2014 1.00  0.50 - 1.10 mg/dL Final  . Calcium 07/09/2014 9.5  8.4 - 10.5 mg/dL Final  . Total Protein 07/09/2014 7.4  6.0 - 8.3 g/dL  Final  . Albumin 07/09/2014 3.9  3.5 - 5.2 g/dL Final  . AST 07/09/2014 21  0 - 37 U/L Final  . ALT 07/09/2014 10  0 - 35 U/L Final  . Alkaline Phosphatase 07/09/2014 59  39 - 117 U/L Final  . Total Bilirubin 07/09/2014 0.8  0.3 - 1.2 mg/dL Final  . GFR calc non Af Amer 07/09/2014 54* >90 mL/min Final  . GFR calc Af Amer 07/09/2014 62* >90 mL/min Final   Comment: (NOTE)  The eGFR has been calculated using the CKD EPI equation.                          This calculation has not been validated in all clinical situations.                          eGFR's persistently <90 mL/min signify possible Chronic Kidney                          Disease.  . Anion gap 07/09/2014 13  5 - 15 Final  . Color, Urine 07/09/2014 YELLOW  YELLOW Final  . APPearance 07/09/2014 CLOUDY* CLEAR Final  . Specific Gravity, Urine 07/09/2014 1.010  1.005 - 1.030 Final  . pH 07/09/2014 7.0  5.0 - 8.0 Final  . Glucose, UA 07/09/2014 NEGATIVE  NEGATIVE mg/dL Final  . Hgb urine dipstick 07/09/2014 NEGATIVE  NEGATIVE Final  . Bilirubin Urine 07/09/2014 NEGATIVE  NEGATIVE Final  . Ketones, ur 07/09/2014 NEGATIVE  NEGATIVE mg/dL Final  . Protein, ur 07/09/2014 NEGATIVE  NEGATIVE mg/dL Final  . Urobilinogen, UA 07/09/2014 1.0  0.0 - 1.0 mg/dL Final  . Nitrite 07/09/2014 NEGATIVE  NEGATIVE Final  . Leukocytes, UA 07/09/2014 TRACE* NEGATIVE Final  . Specimen Description 07/09/2014 URINE, CLEAN CATCH   Final  . Special Requests 07/09/2014 NONE   Final  . Culture  Setup Time 07/09/2014    Final                   Value:07/09/2014 10:23                         Performed at [facility]  . Colony Count 07/09/2014    Final                   Value:>=100,000 COLONIES/ML                         Performed at [facility]  . Culture 07/09/2014    Final                   Value:Multiple bacterial morphotypes present, none predominant. Suggest appropriate recollection if clinically indicated.                          Performed at [facility]  . Report Status 07/09/2014 07/10/2014 FINAL   Final  . Lactic Acid, Venous 07/09/2014 0.71  0.5 - 2.2 mmol/L Final  . Troponin I 07/09/2014 <0.30  <0.30 ng/mL Final   Comment:                                 Due to the release kinetics of cTnI,                          a negative result within the first hours                          of the onset of symptoms does not rule out  myocardial infarction with certainty.                          If myocardial infarction is still suspected,                          repeat the test at appropriate intervals.  . Squamous Epithelial / LPF 07/09/2014 MANY* RARE Final  . WBC, UA 07/09/2014 3-6  <3 WBC/hpf Final  . Bacteria, UA 07/09/2014 FEW* RARE Final  . Troponin I 07/09/2014 <0.30  <0.30 ng/mL Final   Comment:                                 Due to the release kinetics of cTnI,                          a negative result within the first hours                          of the onset of symptoms does not rule out                          myocardial infarction with certainty.                          If myocardial infarction is still suspected,                          repeat the test at appropriate intervals.  . Lactic Acid, Venous 07/09/2014 1.39  0.5 - 2.2 mmol/L Final    Ct Abdomen Pelvis Wo Contrast  07/09/2014   ADDENDUM REPORT: 07/09/2014 16:25  ADDENDUM: The case was discussed by telephone with Dr. Kathrynn Humble. Given the patient's symptom complex and normal renal function, a CTA of the chest and abdomen and pelvis will be performed to more fully assess the visualized portions of the thoracic aorta as well as the distal abdominal aorta for possible dissection.   Electronically Signed   By: David  [place]   On: 07/09/2014 16:25   07/09/2014   CLINICAL DATA:  Left lower leg pain extending into the left back, decreased urinary stream volume  EXAM: CT ABDOMEN AND PELVIS WITHOUT  CONTRAST  TECHNIQUE: Multidetector CT imaging of the abdomen and pelvis was performed following the standard protocol without IV contrast.  COMPARISON:  Acute abdominal series of today's date  FINDINGS: The abdominal aorta at the level of the aortic hiatus measured measures 3.3 cm AP x 3.9 cm transversely. At the level of the renal arteries it measures 3.1 cm AP x 3.2 cm transversely. Just above the bifurcation the aorta measures 2.2 cm AP x 2.3 cm transversely. The common iliac arteries exhibit no aneurysm.  The liver, gallbladder, pancreas, spleen, partially distended stomach, adrenal glands, and kidneys are normal. The small and large bowel exhibit no evidence of ileus nor obstruction nor acute inflammation. The urinary bladder is normal. The uterus is surgically absent. There are no adnexal masses. There is no inguinal or significant umbilical hernia.  The lumbar vertebral bodies are preserved in height. There is disc space degenerative aggression disc space narrowing at L5-S1. The bony pelvis is unremarkable. The lung bases are clear.  IMPRESSION: 1. The thoracic aorta just distal to the aortic arch exhibits increased caliber measuring 4.3 cm AP x 4 cm transversely. Mural calcification is somewhat eccentrically positioned to the left posteriorly. More proximally one cannot comment upon the aorta. The abdominal aorta exhibits atherosclerotic calcification and mild aneurysmal dilation of its proximal and midportions. There is a normal tapering caliber. The maximal measured diameter is at the aortic hiatus and this is 3.9 cm in the transverse plane. There is no evidence of periaortic hemorrhage. 2. There is no acute hepatobiliary, urinary tract, or bowel abnormality. 3. Contrast enhanced CT scanning of the thorax with attention to the aorta would be useful given the patient's symptoms.  Electronically Signed: By: David  [place] On: 07/09/2014 14:52   Dg Abd Acute W/chest  07/09/2014   CLINICAL DATA:  Left  shoulder blade pain, low back pain. Left flank pain.  EXAM: ACUTE ABDOMEN SERIES (ABDOMEN 2 VIEW & CHEST 1 VIEW)  COMPARISON:  11/12/2013.  Chest x-ray 02/18/2013  FINDINGS: Prior CABG. Cardiomegaly with tortuosity and ectasia of the thoracic aorta. No confluent opacities or effusions.  Nonobstructive bowel gas pattern. No organomegaly or free air. No suspicious calcification. No acute bony abnormality.  IMPRESSION: Cardiomegaly.  No active cardiopulmonary disease.  No evidence of bowel obstruction or free air.   Electronically Signed   By: Charlett Nose M.D.   On: 07/09/2014 10:09   Ct Angio Chest Aorta W/cm &/or Wo/cm  07/09/2014   CLINICAL DATA:  Left periscapular pain radiating to the low back. History of multiple medical problems including atrial fibrillation, hypertension, myocardial infarction, diabetes and peripheral vascular disease.  EXAM: CT ANGIOGRAPHY CHEST, ABDOMEN AND PELVIS  TECHNIQUE: Multidetector CT imaging through the chest, abdomen and pelvis was performed using the standard protocol during bolus administration of intravenous contrast. Multiplanar reconstructed images and MIPs were obtained and reviewed to evaluate the vascular anatomy.  CONTRAST:  OMNIPAQUE IOHEXOL 350 MG/ML SOLN  COMPARISON:  Chest CT 11/30/2012. Non contrast abdominal CT 07/09/2014.  FINDINGS: CTA CHEST FINDINGS  Pre contrast images demonstrate prior median sternotomy and extensive atherosclerosis of the aorta, great vessels and coronary arteries. Post-contrast, there is extensive intimal irregularity of the descending thoracic aorta which is similar to the prior examination. There is chronic mural thrombus and ulcerated plaque. No dissection or large vessel occlusion demonstrated. The pulmonary arteries are well opacified with contrast. There is no evidence of acute pulmonary embolism. The heart is mildly enlarged.  Small mediastinal and hilar lymph nodes are stable. There are trace bilateral pleural effusions. There  is no significant pericardial effusion.  There is patchy atelectasis at both lung bases. No airspace disease or endobronchial lesion demonstrated.  Review of the MIP images confirms the above findings.  CTA ABDOMEN AND PELVIS FINDINGS  There is extensive atherosclerosis of the abdominal aorta with irregular mural thrombus and ulcerated plaque as previously demonstrated. No displaced intimal calcifications are demonstrated. Mild dilatation of the proximal aorta has slightly progressed, now measuring up to 3.8 cm transverse (3.4 cm previously). There is no focal aneurysm or evidence of retroperitoneal hematoma. The celiac trunk is patent with mild proximal stenosis. There is also ostial stenosis of the superior mesenteric artery. The inferior mesenteric artery appears occluded. There is probable stenosis of the renal arteries proximally, greater on the left.  The liver demonstrates mild contour irregularity and reflux of contrast into the hepatic veins. No focal liver lesion identified. As evaluated in the arterial phase, these spleen, gallbladder, pancreas and adrenal  glands appear unremarkable. There is mild renal cortical thinning bilaterally.  There is no bowel wall thickening or pneumatosis. No inflammatory changes are identified within the abdomen or pelvis. The uterus is surgically absent. The bladder appears normal.  Lower lumbar spine degenerative changes are present. No acute osseous findings are seen.  Review of the MIP images confirms the above findings.  IMPRESSION: 1. No acute vascular findings are demonstrated within the chest, abdomen or pelvis. 2. Severe atherosclerosis with irregular mural thrombus and ulcerated plaque formation throughout the thoracoabdominal aorta. Findings are similar to the prior study. There is proximal stenosis of the celiac trunk, superior mesenteric artery and both renal arteries. The inferior mesenteric artery appears occluded. 3. No evidence of retroperitoneal hematoma or  mesenteric ischemia.   Electronically Signed   By: Camie Patience M.D.   On: 07/09/2014 18:38   Ct Angio Abd/pel W/ And/or W/o  07/09/2014   CLINICAL DATA:  Left periscapular pain radiating to the low back. History of multiple medical problems including atrial fibrillation, hypertension, myocardial infarction, diabetes and peripheral vascular disease.  EXAM: CT ANGIOGRAPHY CHEST, ABDOMEN AND PELVIS  TECHNIQUE: Multidetector CT imaging through the chest, abdomen and pelvis was performed using the standard protocol during bolus administration of intravenous contrast. Multiplanar reconstructed images and MIPs were obtained and reviewed to evaluate the vascular anatomy.  CONTRAST:  17mL OMNIPAQUE IOHEXOL 350 MG/ML SOLN  COMPARISON:  Chest CT 11/30/2012. Non contrast abdominal CT 07/09/2014.  FINDINGS: CTA CHEST FINDINGS  Pre contrast images demonstrate prior median sternotomy and extensive atherosclerosis of the aorta, great vessels and coronary arteries. Post-contrast, there is extensive intimal irregularity of the descending thoracic aorta which is similar to the prior examination. There is chronic mural thrombus and ulcerated plaque. No dissection or large vessel occlusion demonstrated. The pulmonary arteries are well opacified with contrast. There is no evidence of acute pulmonary embolism. The heart is mildly enlarged.  Small mediastinal and hilar lymph nodes are stable. There are trace bilateral pleural effusions. There is no significant pericardial effusion.  There is patchy atelectasis at both lung bases. No airspace disease or endobronchial lesion demonstrated.  Review of the MIP images confirms the above findings.  CTA ABDOMEN AND PELVIS FINDINGS  There is extensive atherosclerosis of the abdominal aorta with irregular mural thrombus and ulcerated plaque as previously demonstrated. No displaced intimal calcifications are demonstrated. Mild dilatation of the proximal aorta has slightly progressed, now  measuring up to 3.8 cm transverse (3.4 cm previously). There is no focal aneurysm or evidence of retroperitoneal hematoma. The celiac trunk is patent with mild proximal stenosis. There is also ostial stenosis of the superior mesenteric artery. The inferior mesenteric artery appears occluded. There is probable stenosis of the renal arteries proximally, greater on the left.  The liver demonstrates mild contour irregularity and reflux of contrast into the hepatic veins. No focal liver lesion identified. As evaluated in the arterial phase, these spleen, gallbladder, pancreas and adrenal glands appear unremarkable. There is mild renal cortical thinning bilaterally.  There is no bowel wall thickening or pneumatosis. No inflammatory changes are identified within the abdomen or pelvis. The uterus is surgically absent. The bladder appears normal.  Lower lumbar spine degenerative changes are present. No acute osseous findings are seen.  Review of the MIP images confirms the above findings.  IMPRESSION: 1. No acute vascular findings are demonstrated within the chest, abdomen or pelvis. 2. Severe atherosclerosis with irregular mural thrombus and ulcerated plaque formation throughout the thoracoabdominal aorta. Findings are  similar to the prior study. There is proximal stenosis of the celiac trunk, superior mesenteric artery and both renal arteries. The inferior mesenteric artery appears occluded. 3. No evidence of retroperitoneal hematoma or mesenteric ischemia.   Electronically Signed   By: Camie Patience M.D.   On: 07/09/2014 18:38     Assessment/Plan 1. Essential hypertension Add - cloNIDine (CATAPRES) 0.1 MG tablet; Take 1 tablet (0.1 mg total) by mouth 3 (three) times daily. Take one tablet 3 times daily to help control BP  Dispense: 90 tablet; Refill: 3  2. Type II or unspecified type diabetes mellitus with renal manifestations, not stated as uncontrolled controlled  3. AAA (abdominal aortic aneurysm) without  rupture monitor

## 2014-07-23 ENCOUNTER — Other Ambulatory Visit: Payer: Medicare Other

## 2014-07-23 DIAGNOSIS — E1129 Type 2 diabetes mellitus with other diabetic kidney complication: Secondary | ICD-10-CM

## 2014-07-23 DIAGNOSIS — E785 Hyperlipidemia, unspecified: Secondary | ICD-10-CM | POA: Diagnosis not present

## 2014-07-24 LAB — LIPID PANEL
Chol/HDL Ratio: 3.1 ratio (ref 0.0–4.4)
Cholesterol, Total: 159 mg/dL (ref 100–199)
HDL: 52 mg/dL
LDL Calculated: 92 mg/dL (ref 0–99)
Triglycerides: 73 mg/dL (ref 0–149)
VLDL Cholesterol Cal: 15 mg/dL (ref 5–40)

## 2014-07-24 LAB — BASIC METABOLIC PANEL
BUN/Creatinine Ratio: 20 (ref 11–26)
BUN: 31 mg/dL — ABNORMAL HIGH (ref 8–27)
CALCIUM: 9.9 mg/dL (ref 8.7–10.3)
CO2: 26 mmol/L (ref 18–29)
CREATININE: 1.52 mg/dL — AB (ref 0.57–1.00)
Chloride: 96 mmol/L — ABNORMAL LOW (ref 97–108)
GFR calc Af Amer: 38 mL/min/{1.73_m2} — ABNORMAL LOW (ref >59)
GFR calc non Af Amer: 33 mL/min/{1.73_m2} — ABNORMAL LOW (ref >59)
GLUCOSE: 105 mg/dL — AB (ref 65–99)
Potassium: 4.2 mmol/L (ref 3.5–5.2)
Sodium: 137 mmol/L (ref 134–144)

## 2014-07-24 LAB — HEMOGLOBIN A1C
ESTIMATED AVERAGE GLUCOSE: 140 mg/dL
HEMOGLOBIN A1C: 6.5 % — AB (ref 4.8–5.6)

## 2014-07-27 ENCOUNTER — Ambulatory Visit: Payer: Medicare Other | Admitting: Internal Medicine

## 2014-08-02 ENCOUNTER — Encounter: Payer: Self-pay | Admitting: Cardiovascular Disease

## 2014-08-02 ENCOUNTER — Ambulatory Visit (INDEPENDENT_AMBULATORY_CARE_PROVIDER_SITE_OTHER): Payer: Medicare Other | Admitting: Cardiovascular Disease

## 2014-08-02 VITALS — BP 122/62 | HR 80 | Ht 62.0 in | Wt 146.5 lb

## 2014-08-02 DIAGNOSIS — I6529 Occlusion and stenosis of unspecified carotid artery: Secondary | ICD-10-CM | POA: Diagnosis not present

## 2014-08-02 DIAGNOSIS — E785 Hyperlipidemia, unspecified: Secondary | ICD-10-CM

## 2014-08-02 DIAGNOSIS — I739 Peripheral vascular disease, unspecified: Secondary | ICD-10-CM

## 2014-08-02 DIAGNOSIS — I2789 Other specified pulmonary heart diseases: Secondary | ICD-10-CM | POA: Diagnosis not present

## 2014-08-02 DIAGNOSIS — I701 Atherosclerosis of renal artery: Secondary | ICD-10-CM | POA: Diagnosis not present

## 2014-08-02 DIAGNOSIS — I2510 Atherosclerotic heart disease of native coronary artery without angina pectoris: Secondary | ICD-10-CM | POA: Diagnosis not present

## 2014-08-02 DIAGNOSIS — I272 Pulmonary hypertension, unspecified: Secondary | ICD-10-CM

## 2014-08-02 NOTE — Patient Instructions (Signed)
Dr. Croitoru recommends that you schedule a follow-up appointment in: ONE YEAR   

## 2014-08-06 NOTE — Progress Notes (Signed)
Patient ID: Kendra Todd, female   DOB: 03/08/1941, 73 y.o.   MRN: [id number]     Reason for office visit CAD status post CABG, hypertensive heart disease, severe hypertension, thoracoabdominal aortic aneurysm, carotid artery disease, hyperlipidemia, Paroxysmal atrial  Kendra Todd has extensive coronary and peripheral arterial disease. She underwent coronary bypass surgery January 2014, after having previously undergone stents to the right coronary artery in 2002 and several angioplasty procedures before that. She has preserved left ventricular systolic function. Despite echo evidence of diastolic dysfunction, congestive heart failure has not been a problem.  She has undergone bilateral carotid endarterectomies and has recurrent stenoses. Of her surgeries was complicated by a dissection flap and a stroke, from which she has recovered. She has a moderate size thoracoabdominal aortic aneurysm.    She has previously undergone TEE guided cardioversion for paroxysmal atrial fibrillation (2012) Had postoperative atrial fibrillation after bypass surgery. Anticoagulants were stopped due to gastrointestinal bleeding that required transfusion. She takes aspirin. She was poorly tolerant to amiodarone  She has severe systemic hypertension and has taken no long time to find a regimen that controls it. He finally seemed to have settled on a combination of high-dose carvedilol, Clonidine, hydralazine, valsartan/hydrochlorothiazide. She has moderate bilateral renal artery stenosis at angiography January 2015, not felt to be severe enough to cause secondary hypertension.  Despite his multiple problems, recently she has been feeling well without any cardiovascular complaints. Her only symptom is occasional unexplained nausea. This is not exercise related   Allergies  Allergen Reactions  . Iron Hives  . Norvasc [Amlodipine Besylate] Nausea And Vomiting  . Promethazine Other (See Comments)    hallucinations  .  Vicodin [Hydrocodone-Acetaminophen] Other (See Comments)    Caused arrythmia   . Vioxx [Rofecoxib] Nausea And Vomiting    Current Outpatient Prescriptions  Medication Sig Dispense Refill  . acetaminophen-codeine (TYLENOL #3) 300-30 MG per tablet Take 1 tablet by mouth every 6 (six) hours as needed for moderate pain.      [name] aspirin 81 MG tablet Take 81 mg by mouth daily.       . carvedilol (COREG) 25 MG tablet Take 1 tablet (25 mg total) by mouth 2 (two) times daily.  60 tablet  3  . cloNIDine (CATAPRES) 0.1 MG tablet Take 1 tablet (0.1 mg total) by mouth 3 (three) times daily. Take one tablet 3 times daily to help control BP  90 tablet  3  . hydrALAZINE (APRESOLINE) 50 MG tablet Take 1.5 tablets (75 mg total) by mouth 3 (three) times daily.  135 tablet  3  . latanoprost (XALATAN) 0.005 % ophthalmic solution Place 1 drop into both eyes at bedtime.      . metFORMIN (GLUCOPHAGE) 1000 MG tablet Take 1,000 mg by mouth 2 (two) times daily with a meal.      . nitroGLYCERIN (NITROSTAT) 0.4 MG SL tablet Place 0.4 mg under the tongue every 5 (five) minutes as needed for chest pain.      [name] oxyCODONE-acetaminophen (PERCOCET/ROXICET) 5-325 MG per tablet Take 1-2 tablets by mouth every 4 (four) hours as needed for severe pain.  15 tablet  0  . rosuvastatin (CRESTOR) 40 MG tablet Take 40 mg by mouth daily.      . valsartan-hydrochlorothiazide (DIOVAN-HCT) 320-25 MG per tablet Take 1 tablet by mouth daily. For blood pressure.  90 tablet  3   No current facility-administered medications for this visit.    Past Medical History  Diagnosis Date  . Arthritis   .  Atrial fibrillation 02/21/2011  . Hypertension   . Hyperlipidemia   . Myocardial infarction   . Diabetes mellitus age 31  . CAD (coronary artery disease)     prior coronary stenting  . Stroke 2001  . Peripheral vascular disease   . Carotid artery disease 2001    s/p Bilateral CEA, after CVA  . TR (tricuspid regurgitation), severe 12/02/12 for  repair with CABG 12/03/2012  . Pulmonary HTN 12/03/2012  . Dyspnea 03/13/2006    Myoview - EF 76%; mild ischemia in apical lateral region, less severe than previous study in 2002  . H/O endarterectomy 09/11/2012    patent L and R carotid sites, R 60-79% restenosed ICA; L <40% restenosed ICA  . Stenosis of artery 06/03/2012    doppler- most distal aspect of abd aorta no aneurysmal dilation; bilateral ABIs - mild L arterial insufficiency, L CIA narrowing w/ 50-69% diameter reduction; L and R SFA both w/ 0-49% diameter reductions  . H/O endarterectomy 01/08/2012    doppler - R distal common carotid/proximal ICA stenosed 80-99%, L 0-39%  . Decreased pedal pulses 06/04/2011    doppler - bilateral ABIs no evidence of insufficiency; L CIA slightly elevated velocities 20-30% diameter reduction;   . Limb pain 05/02/2011    doppler - no evidence of thrombus of thrombophlebitis  . Diabetes   . H/O carotid endarterectomy 03/06/2012    CT angio -high grade stenosis of proximal R internal carotid w/ lg posterior ulceration or dissection flap; near occlusive stenosis at origin of nondominantproximal L vertebral artery; <50% stenosis of proximal R vertebral and proximal L subclavian artery  . Urinary tract infection, site not specified   . Tobacco use disorder   . Unspecified cataract   . Myalgia and myositis, unspecified   . Cervicalgia   . Acute upper respiratory infections of unspecified site   . Tobacco use disorder   . Peripheral vascular disease, unspecified   . Atrial fibrillation   . Phlebitis and thrombophlebitis of superficial vessels of lower extremities   . Edema   . Long term (current) use of anticoagulants   . Loss of weight   . Abdominal pain, epigastric   . Atrial fibrillation   . Benign paroxysmal positional vertigo   . Type II or unspecified type diabetes mellitus with peripheral circulatory disorders, uncontrolled(250.72)   . Unspecified vitamin D deficiency   . Other manifestations of  vitamin A deficiency   . Type II or unspecified type diabetes mellitus with peripheral circulatory disorders, not stated as uncontrolled(250.70)   . Primary localized osteoarthrosis, hand   . Abdominal pain, epigastric   . Nontoxic uninodular goiter   . Nonspecific abnormal results of liver function study   . Candidiasis of vulva and vagina   . Routine gynecological examination   . Unspecified disorder of iris and ciliary body   . Spinal stenosis, unspecified region other than cervical   . Other malaise and fatigue   . Pain in joint, pelvic region and thigh   . Cervicalgia   . Other and unspecified hyperlipidemia   . Edema   . Coronary atherosclerosis of unspecified type of vessel, native or graft   . Pain in limb   . Osteoarthrosis, unspecified whether generalized or localized, unspecified site   . Occlusion and stenosis of carotid artery without mention of cerebral infarction   . Lumbago   . Myalgia and myositis, unspecified   . Renal artery stenosis     Past Surgical History  Procedure Laterality  Date  . Carotid endarterectomy  2001    Bilateral  . Cholecystectomy    . Kidney stone surgery      Removal  . Back surgery  [phone]    three previous surgeries  . Angioplasty  1984 and 1982  . Abdominal hysterectomy  1975  . Percutaneous coronary stent intervention (pci-s)  2002    2 BMS in ostial/Prox & mid RCA (mid 2.5 mm 20x  mm, & 2.75 mm x  8 mm ostial)  . Coronary artery bypass graft  12/04/2012    Procedure: CORONARY ARTERY BYPASS GRAFTING (CABG);  Surgeon: Ivin Poot, MD;  Location: [facility];  Service: Open Heart Surgery;  Laterality: N/A;  . Intraoperative transesophageal echocardiogram  12/04/2012    Procedure: INTRAOPERATIVE TRANSESOPHAGEAL ECHOCARDIOGRAM;  Surgeon: Ivin Poot, MD;  Location: [facility];  Service: Open Heart Surgery;  Laterality: N/A;  . Appendectomy  1954  . Tee with cardioversion  01/23/2011    EF 36-64%; grade 2 diastolic dysfunction,  elevated mean LA filling pressure, RV systolic pressure increased consistent w/ mod pulmonary hypertension  . Arch aortogram  03/30/2011    40% recurrent stenosis at origin of R proximal carotid patch, shelf like recurrent stenosis w/in midsection of patch that does not appear to be flow limiting; normal non-stenosed great vessel origins    Family History  Problem Relation Age of Onset  . Other Mother     CVA  . Diabetes Mother   . Heart disease Mother     Heart disease before age 79  . Heart attack Mother   . Heart disease Father     Heart Disease before age 70  . Heart attack Father   . Heart disease Sister     Amputation  . Diabetes Sister   . Heart disease Brother     Heart disease before age 70  . Diabetes Brother   . Stroke Brother   . Stroke Brother   . Heart disease Brother   . Stroke Brother   . Heart disease Brother   . Heart disease Sister   . Heart disease Sister   . Diabetes Other   . Cancer Other     breast  . Heart disease Other     cad  . Other Other     cva  . Diabetes Daughter   . Cancer Sister     History   Social History  . Marital Status: Widowed    Spouse Name: N/A    Number of Children: N/A  . Years of Education: N/A   Occupational History  . Not on file.   Social History Main Topics  . Smoking status: Former Smoker -- 0.30 packs/day for 57 years    Types: Cigarettes    Quit date: 11/19/2012  . Smokeless tobacco: Never Used     Comment: pt states she is working on it but husband just passed  . Alcohol Use: 0.6 oz/week    1 Glasses of wine per week  . Drug Use: No  . Sexual Activity: Not on file   Other Topics Concern  . Not on file   Social History Narrative  . No narrative on file    Review of systems: Occasional nausea The patient specifically denies any chest pain at rest or with exertion, dyspnea at rest or with exertion, orthopnea, paroxysmal nocturnal dyspnea, syncope, palpitations, focal neurological deficits,  intermittent claudication, lower extremity edema, unexplained weight gain, cough, hemoptysis or wheezing.  The patient  also denies abdominal pain,  vomiting, dysphagia, diarrhea, constipation, polyuria, polydipsia, dysuria, hematuria, frequency, urgency, abnormal bleeding or bruising, fever, chills, unexpected weight changes, mood swings, change in skin or hair texture, change in voice quality, auditory or visual problems, allergic reactions or rashes, new musculoskeletal complaints other than usual "aches and pains".   PHYSICAL EXAM BP 122/62  Pulse 80  Ht 5\' 2"  (1.575 m)  Wt 66.452 kg (146 lb 8 oz)  BMI 26.79 kg/m2  General: Alert, oriented x3, no distress Head: no evidence of trauma, PERRL, EOMI, no exophtalmos or lid lag, no myxedema, no xanthelasma; normal ears, nose and oropharynx Neck: normal jugular venous pulsations and no hepatojugular reflux; brisk carotid pulses without delay and Loud bilateral carotid bruits Chest: clear to auscultation, no signs of consolidation by percussion or palpation, normal fremitus, symmetrical and full respiratory excursions Cardiovascular: normal position and quality of the apical impulse, regular rhythm, normal first and second heart sounds, no murmurs, rubs. Loud S4 gallop Abdomen: no tenderness or distention, no masses by palpation, no abnormal pulsatility or arterial bruits, normal bowel sounds, no hepatosplenomegaly Extremities: no clubbing, cyanosis or edema; 2+ radial, ulnar and brachial pulses bilaterally; 2+ right femoral, posterior tibial and dorsalis pedis pulses; 2+ left femoral, posterior tibial and dorsalis pedis pulses; bilateral femoral bruits  Neurological: grossly nonfocal   EKG: Sinus rhythm with borderline criteria for left ventricular hypertrophy and delayed anterior R-wave progression  Lipid Panel     Component Value Date/Time   CHOL 160 11/28/2012 0239   TRIG 73 07/23/2014 0854   HDL 52 07/23/2014 0854   HDL 49 11/28/2012 0239    CHOLHDL 3.1 07/23/2014 0854   CHOLHDL 3.3 11/28/2012 0239   VLDL 12 11/28/2012 0239   LDLCALC 92 07/23/2014 0854   LDLCALC 99 11/28/2012 0239    BMET    Component Value Date/Time   NA 137 07/23/2014 0854   NA 137 07/09/2014 0937   K 4.2 07/23/2014 0854   CL 96* 07/23/2014 0854   CO2 26 07/23/2014 0854   GLUCOSE 105* 07/23/2014 0854   GLUCOSE 108* 07/09/2014 0937   BUN 31* 07/23/2014 0854   BUN 20 07/09/2014 0937   CREATININE 1.52* 07/23/2014 0854   CREATININE 1.21* 03/09/2014 0845   CALCIUM 9.9 07/23/2014 0854   GFRNONAA 33* 07/23/2014 0854   GFRAA 38* 07/23/2014 0854     ASSESSMENT AND PLAN  CAD (coronary artery disease): 1993 PTCA to circumflex. 2002 2 BMS stents to ostial, S/P CABG x 3 12/04/12  Now Almost 2 years following bypass surgery. She is free of angina and can perform physical activity without shortness of breath. Functional class I  DM2 (diabetes mellitus, type 2)  She reports good glycemic control   HTN (hypertension)  Her blood pressure is Finally well controlled  HLD (hyperlipidemia)  Ideally, her LDL should be less than 70, but the current LDL level is substantially improved over baseline (greater than 50% reduction)  Recurrent carotid stenosis  She has recently seen Dr. Oneida Alar and there is restenosis in the right carotid to approximately 80% severity. She has undergone previous bilateral carotid endarterectomy. Discussed the fact that a stent in the superior option to repeat surgery.   Thoracoabdominal aortic aneurysm Dr. Oneida Alar: "CT angiogram from 07/09/2014 is reviewed. This is compared to a CT scan of the chest performed in January 2014. Descending thoracic and upper abdominal aorta was 3.4 cm in January 2014. This area of the aorta now measures 4 cm. The aneurysm begins at  the level of the left subclavian artery and extends to the peri-renal segment making this a type II thoracoabdominal aneurysm.  ASSESSMENT: Type II thoracoabdominal aneurysm with 8 mm of growth in the  last 9 months. Repair of type II thoracoabdominal aneurysm is associated with significant morbidity mortality and risk of paraplegia. We would consider repair of this at 6 cm or greater"  Atrial fibrillation  Recently it has not recurred. She is not on anticoagulation secondary to severe GI bleeding while on Xarelto. She is not taking any antiarrhythmics. She tolerated amiodarone poorly. Her previous neurological event was related to carotid surgery, not embolic phenomena.  PAD (peripheral artery disease)  Her most recent Doppler study in July of 2013 showed a 50-69% stenosis in the left common iliac artery there was three-vessel runoff in the calf on the right side there was two-vessel runoff to 2 occlusion of the anterior tibial artery with distal reconstitution; bilaterally the ABIs were normal; she does not describe claudication today.  Patient Instructions  Dr. [name] recommends that you schedule a follow-up appointment in: [place].       Holli Humbles, MD, [place] [phone] office [phone] pager

## 2014-08-11 DIAGNOSIS — M159 Polyosteoarthritis, unspecified: Secondary | ICD-10-CM | POA: Diagnosis not present

## 2014-08-19 DIAGNOSIS — E119 Type 2 diabetes mellitus without complications: Secondary | ICD-10-CM | POA: Diagnosis not present

## 2014-08-19 DIAGNOSIS — H2512 Age-related nuclear cataract, left eye: Secondary | ICD-10-CM | POA: Diagnosis not present

## 2014-08-19 DIAGNOSIS — H4011X1 Primary open-angle glaucoma, mild stage: Secondary | ICD-10-CM | POA: Diagnosis not present

## 2014-08-25 ENCOUNTER — Other Ambulatory Visit: Payer: Self-pay | Admitting: *Deleted

## 2014-08-25 DIAGNOSIS — I10 Essential (primary) hypertension: Secondary | ICD-10-CM

## 2014-08-25 MED ORDER — CLONIDINE HCL 0.1 MG PO TABS: ORAL_TABLET | ORAL | Status: DC

## 2014-08-25 NOTE — Telephone Encounter (Signed)
[facility]

## 2014-08-30 ENCOUNTER — Other Ambulatory Visit: Payer: Self-pay | Admitting: Internal Medicine

## 2014-09-06 DIAGNOSIS — M159 Polyosteoarthritis, unspecified: Secondary | ICD-10-CM | POA: Diagnosis not present

## 2014-09-09 ENCOUNTER — Other Ambulatory Visit: Payer: Self-pay

## 2014-09-09 MED ORDER — CARVEDILOL 25 MG PO TABS: 25.0000 mg | ORAL_TABLET | Freq: Two times a day (BID) | ORAL | Status: DC

## 2014-09-09 NOTE — Telephone Encounter (Signed)
Rx was sent to pharmacy electronically. 

## 2014-09-14 ENCOUNTER — Other Ambulatory Visit: Payer: Self-pay | Admitting: *Deleted

## 2014-09-14 MED ORDER — CARVEDILOL 25 MG PO TABS: 25.0000 mg | ORAL_TABLET | Freq: Two times a day (BID) | ORAL | Status: DC

## 2014-10-14 ENCOUNTER — Encounter (HOSPITAL_COMMUNITY): Payer: Self-pay | Admitting: Cardiology

## 2014-10-15 ENCOUNTER — Telehealth: Payer: Self-pay

## 2014-10-15 NOTE — Telephone Encounter (Signed)
Patient called left message on Medical Assistant voice mail: had eye exam 08/19/14 with Dr. Venetia Maxon. Let Dr. Nyoka Cowden know.

## 2014-10-26 ENCOUNTER — Ambulatory Visit (INDEPENDENT_AMBULATORY_CARE_PROVIDER_SITE_OTHER): Payer: Medicare Other | Admitting: Internal Medicine

## 2014-10-26 VITALS — BP 118/78 | HR 72 | Temp 97.9°F | Resp 18 | Ht 62.0 in | Wt 154.6 lb

## 2014-10-26 DIAGNOSIS — N189 Chronic kidney disease, unspecified: Secondary | ICD-10-CM

## 2014-10-26 DIAGNOSIS — I2510 Atherosclerotic heart disease of native coronary artery without angina pectoris: Secondary | ICD-10-CM

## 2014-10-26 DIAGNOSIS — M159 Polyosteoarthritis, unspecified: Secondary | ICD-10-CM

## 2014-10-26 DIAGNOSIS — M545 Low back pain, unspecified: Secondary | ICD-10-CM

## 2014-10-26 DIAGNOSIS — M1990 Unspecified osteoarthritis, unspecified site: Secondary | ICD-10-CM | POA: Insufficient documentation

## 2014-10-26 DIAGNOSIS — M549 Dorsalgia, unspecified: Secondary | ICD-10-CM | POA: Insufficient documentation

## 2014-10-26 DIAGNOSIS — I6529 Occlusion and stenosis of unspecified carotid artery: Secondary | ICD-10-CM

## 2014-10-26 DIAGNOSIS — E1122 Type 2 diabetes mellitus with diabetic chronic kidney disease: Secondary | ICD-10-CM

## 2014-10-26 DIAGNOSIS — I10 Essential (primary) hypertension: Secondary | ICD-10-CM | POA: Diagnosis not present

## 2014-10-26 DIAGNOSIS — E785 Hyperlipidemia, unspecified: Secondary | ICD-10-CM | POA: Diagnosis not present

## 2014-10-26 DIAGNOSIS — M150 Primary generalized (osteo)arthritis: Secondary | ICD-10-CM | POA: Diagnosis not present

## 2014-10-26 MED ORDER — ACETAMINOPHEN-CODEINE #3 300-30 MG PO TABS: 1.0000 | ORAL_TABLET | Freq: Four times a day (QID) | ORAL | Status: DC | PRN

## 2014-10-26 NOTE — Progress Notes (Signed)
Patient ID: Kendra Todd, female   DOB: 03-03-1941, 73 y.o.   MRN: [id number]    Facility  [facility]    Place of Service:   OFFICE   Allergies  Allergen Reactions  . Iron Hives  . Norvasc [Amlodipine Besylate] Nausea And Vomiting  . Promethazine Other (See Comments)    hallucinations  . Vicodin [Hydrocodone-Acetaminophen] Other (See Comments)    Caused arrythmia   . Vioxx [Rofecoxib] Nausea And Vomiting    Chief Complaint  Patient presents with  . Medical Management of Chronic Issues    bp falls / uncontrolled    HPI:  "I think I am having diabetic pain in the feet. Numb and uncomfortable feeling".  Thinks she needs to come off some of the BP meds. Has strange feelings. BP this AM was 81/52.  Essential hypertension: currently treated with multiple medications.  Type 2 diabetes mellitus with diabetic chronic kidney disease; stable and controlled  HLD (hyperlipidemia): controlled.  Coronary artery disease involving native coronary artery of native heart without angina pectoris: no recent chest pains.  Midline low back pain without sciatica: chronic. Not disabling. No radiation of pain in legs. Has chronic leg discomfort possibly related to PAD.  Primary osteoarthritis involving multiple joints    Medications: Patient's Medications  New Prescriptions   No medications on file  Previous Medications   ACETAMINOPHEN-CODEINE (TYLENOL #3) 300-30 MG PER TABLET    Take 1 tablet by mouth every 6 (six) hours as needed for moderate pain.   ASPIRIN 81 MG TABLET    Take 81 mg by mouth daily.    BUTRANS 5 MCG/HR PTWK PATCH    Apply topically once a week.   CARVEDILOL (COREG) 25 MG TABLET    Take 1 tablet (25 mg total) by mouth 2 (two) times daily.   CLONIDINE (CATAPRES) 0.1 MG TABLET    Take one tablet by mouth 3 times daily to help control BP   CRESTOR 40 MG TABLET    TAKE 1 TABLET DAILY TO LOWER CHOLESTEROL   HYDRALAZINE (APRESOLINE) 50 MG TABLET    Take 1.5 tablets (75 mg total) by  mouth 3 (three) times daily.   LATANOPROST (XALATAN) 0.005 % OPHTHALMIC SOLUTION    Place 1 drop into both eyes at bedtime.   METFORMIN (GLUCOPHAGE) 1000 MG TABLET    Take 1,000 mg by mouth 2 (two) times daily with a meal.   NITROGLYCERIN (NITROSTAT) 0.4 MG SL TABLET    Place 0.4 mg under the tongue every 5 (five) minutes as needed for chest pain.   VALSARTAN-HYDROCHLOROTHIAZIDE (DIOVAN-HCT) 320-25 MG PER TABLET    Take 1 tablet by mouth daily. For blood pressure.  Modified Medications   No medications on file  Discontinued Medications   OXYCODONE-ACETAMINOPHEN (PERCOCET/ROXICET) 5-325 MG PER TABLET    Take 1-2 tablets by mouth every 4 (four) hours as needed for severe pain.   ROSUVASTATIN (CRESTOR) 40 MG TABLET    Take 40 mg by mouth daily.     Review of Systems  Constitutional: Negative.   HENT: Negative.   Eyes: Negative.   Respiratory: Negative.   Cardiovascular: Negative for chest pain, palpitations and leg swelling.  Endocrine: Negative for polydipsia, polyphagia and polyuria.       History diabetes  Genitourinary: Positive for urgency and frequency. Negative for flank pain.  Musculoskeletal: Positive for back pain and arthralgias.  Skin: Negative.   Neurological: Negative.   Hematological: Negative.   Psychiatric/Behavioral: Negative.     Filed  Vitals:   10/26/14 1553  BP: 118/78  Pulse: 72  Temp: 97.9 F (36.6 C)  TempSrc: Oral  Resp: 18  Height: 5\' 2"  (1.575 m)  Weight: 154 lb 9.6 oz (70.126 kg)  SpO2: 97%   Body mass index is 28.27 kg/(m^2).  Physical Exam  Constitutional: She is oriented to person, place, and time. She appears well-developed and well-nourished. No distress.  HENT:  Head: Normocephalic.  Nose: Nose normal.  Upper and lower dentures present.  Eyes: Conjunctivae are normal. Pupils are equal, round, and reactive to light.  Wears prescription lenses. Right cataract.  Neck: Normal range of motion. Neck supple. No JVD present. No tracheal  deviation present. No thyromegaly present.  Cardiovascular: Normal rate, regular rhythm and normal heart sounds.  Exam reveals no gallop and no friction rub.   No murmur heard. Diminished dorsalis pedis pulses bilaterally and diminished posterior tibial pulses bilaterally.  Pulmonary/Chest: Effort normal and breath sounds normal. No respiratory distress. She has no wheezes. She has no rales. She exhibits no tenderness.  Abdominal: Soft. Bowel sounds are normal. She exhibits no distension and no mass. There is no tenderness.  Musculoskeletal: She exhibits tenderness. She exhibits no edema.  Pain in multiple joints  Lymphadenopathy:    She has no cervical adenopathy.  Neurological: She is alert and oriented to person, place, and time. She has normal reflexes. No cranial nerve deficit. Coordination normal.  Skin: Skin is warm and dry. No rash noted. No erythema. No pallor.  Psychiatric: She has a normal mood and affect. Her behavior is normal. Judgment normal.     Labs reviewed: No visits with results within 3 Month(s) from this visit. Latest known visit with results is:  Appointment on 07/23/2014  Component Date Value Ref Range Status  . Hgb A1c MFr Bld 07/23/2014 6.5* 4.8 - 5.6 % Final   Comment:          Increased risk for diabetes: 5.7 - 6.4                                   Diabetes: >6.4                                   Glycemic control for adults with diabetes: <7.0  . Est. average glucose Bld gHb Est-m* 07/23/2014 140   Final  . Glucose 07/23/2014 105* 65 - 99 mg/dL Final  . BUN 07/23/2014 31* 8 - 27 mg/dL Final  . Creatinine, Ser 07/23/2014 1.52* 0.57 - 1.00 mg/dL Final  . GFR calc non Af Amer 07/23/2014 33* >59 mL/min/1.73 Final  . GFR calc Af Amer 07/23/2014 38* >59 mL/min/1.73 Final  . BUN/Creatinine Ratio 07/23/2014 20  11 - 26 Final  . Sodium 07/23/2014 137  134 - 144 mmol/L Final  . Potassium 07/23/2014 4.2  3.5 - 5.2 mmol/L Final  . Chloride 07/23/2014 96* 97 - 108  mmol/L Final  . CO2 07/23/2014 26  18 - 29 mmol/L Final  . Calcium 07/23/2014 9.9  8.7 - 10.3 mg/dL Final  . Cholesterol, Total 07/23/2014 159  100 - 199 mg/dL Final  . Triglycerides 07/23/2014 73  0 - 149 mg/dL Final  . HDL 07/23/2014 52  >39 mg/dL Final   Comment: According to ATP-III Guidelines, HDL-C >59 mg/dL is considered a  negative risk factor for CHD.  [name] VLDL Cholesterol Cal 07/23/2014 15  5 - 40 mg/dL Final  . LDL Calculated 07/23/2014 92  0 - 99 mg/dL Final  . Chol/HDL Ratio 07/23/2014 3.1  0.0 - 4.4 ratio units Final   Comment:                                   T. Chol/HDL Ratio                                                                      Men  Women                                                        1/2 Avg.Risk  3.4    3.3                                                            Avg.Risk  5.0    4.4                                                         2X Avg.Risk  9.6    7.1                                                         3X Avg.Risk 23.4   11.0     Assessment/Plan Return for lab  1. Essential hypertension Stop hydralazine. Reduce clonidine to twice daily.  2. Type 2 diabetes mellitus with diabetic chronic kidney disease controlled  3. HLD (hyperlipidemia) controlled  4. Coronary artery disease involving native coronary artery of native heart without angina pectoris No recent chest pains  5. Midline low back pain without sciatica Chronic. Unchanged  6. Primary osteoarthritis involving multiple joints - acetaminophen-codeine (TYLENOL #3) 300-30 MG per tablet; Take 1 tablet by mouth every 6 (six) hours as needed for moderate pain.  Dispense: 120 tablet; Refill: 0

## 2014-10-26 NOTE — Patient Instructions (Addendum)
Return for lab: A1c, microalbumin , lipids, and bmp

## 2014-11-29 ENCOUNTER — Encounter: Payer: Self-pay | Admitting: Internal Medicine

## 2014-11-29 MED ORDER — CLONIDINE HCL 0.1 MG PO TABS: ORAL_TABLET | ORAL | Status: DC

## 2014-12-07 ENCOUNTER — Other Ambulatory Visit (HOSPITAL_COMMUNITY): Payer: Self-pay | Admitting: Cardiovascular Disease

## 2014-12-07 ENCOUNTER — Telehealth (HOSPITAL_COMMUNITY): Payer: Self-pay | Admitting: *Deleted

## 2014-12-07 DIAGNOSIS — I739 Peripheral vascular disease, unspecified: Secondary | ICD-10-CM

## 2014-12-07 DIAGNOSIS — M159 Polyosteoarthritis, unspecified: Secondary | ICD-10-CM | POA: Diagnosis not present

## 2014-12-09 ENCOUNTER — Ambulatory Visit: Payer: Medicare Other | Admitting: Family

## 2014-12-09 ENCOUNTER — Other Ambulatory Visit (HOSPITAL_COMMUNITY): Payer: Medicare Other

## 2014-12-10 ENCOUNTER — Other Ambulatory Visit: Payer: Self-pay | Admitting: Internal Medicine

## 2014-12-14 ENCOUNTER — Ambulatory Visit (HOSPITAL_COMMUNITY)
Admission: RE | Admit: 2014-12-14 | Discharge: 2014-12-14 | Disposition: A | Discharge status: Home / Self Care | Payer: Medicare Other | Source: Ambulatory Visit | Attending: Cardiology | Admitting: Cardiology

## 2014-12-14 DIAGNOSIS — I739 Peripheral vascular disease, unspecified: Secondary | ICD-10-CM | POA: Diagnosis not present

## 2014-12-14 NOTE — Progress Notes (Signed)
Lower Extremity Arterial Duplex Completed. °Brianna L Mazza,RVT °

## 2014-12-17 ENCOUNTER — Encounter: Payer: Self-pay | Admitting: *Deleted

## 2014-12-31 ENCOUNTER — Ambulatory Visit (INDEPENDENT_AMBULATORY_CARE_PROVIDER_SITE_OTHER): Payer: Medicare Other

## 2014-12-31 ENCOUNTER — Ambulatory Visit (INDEPENDENT_AMBULATORY_CARE_PROVIDER_SITE_OTHER): Payer: Medicare Other | Admitting: Podiatrist

## 2014-12-31 ENCOUNTER — Encounter: Payer: Self-pay | Admitting: Podiatrist

## 2014-12-31 VITALS — BP 126/69 | HR 74 | Resp 12

## 2014-12-31 DIAGNOSIS — M79671 Pain in right foot: Secondary | ICD-10-CM

## 2014-12-31 DIAGNOSIS — M799 Soft tissue disorder, unspecified: Secondary | ICD-10-CM | POA: Diagnosis not present

## 2014-12-31 DIAGNOSIS — M6740 Ganglion, unspecified site: Secondary | ICD-10-CM | POA: Diagnosis not present

## 2014-12-31 NOTE — Patient Instructions (Signed)

## 2014-12-31 NOTE — Progress Notes (Signed)
   Subjective:    Patient ID: Kendra Todd, female    DOB: 06/26/1941, 74 y.o.   MRN: [id number]  HPI  PT STATED RT BALL OF THE FOOT HAVING BURNING/PAIN FOR 2 WEEKS. THE FOOT IS BEEN THE SAME BUT WHEN WALKING IN DISTANCE GET WORSE. TRIED NO TREATMENT.  Review of Systems  All other systems reviewed and are negative.      Objective:   Physical Exam Chief Complaint  Patient presents with  . Foot Pain    ''RT BALL OF THE FOOT IS HURTING.''     HPI: Patient is 74 y.o. female who presents today for painful lesion ball of the right foot.   Allergies  Allergen Reactions  . Iron Hives  . Norvasc [Amlodipine Besylate] Nausea And Vomiting  . Promethazine Other (See Comments)    hallucinations  . Vicodin [Hydrocodone-Acetaminophen] Other (See Comments)    Caused arrythmia   . Vioxx [Rofecoxib] Nausea And Vomiting    Physical Exam  Patient is awake, alert, and oriented x 3.  In no acute distress.  Vascular status is intact with palpable pedal pulses at 2/4 DP and PT bilateral and capillary refill time within normal limits. Neurological sensation is also intact bilaterally via Semmes Weinstein monofilament at 5/5 sites. Light touch, vibratory sensation, Achilles tendon reflex is intact. Dermatological exam reveals skin color, turger and texture as normal. No open lesions present.  Musculature intact with dorsiflexion, plantarflexion, inversion, eversion. Very small pea like soft tissue lesion is palpated distal and medial to the tibial sesamoid.  It is tender when touched.  No redness, swelling or signs of infection are present.  xrays do not show the lesion present.   Assessment: ganglion/ soft tissue tumor  Plan: Recommended an injection of steroid to help shrink the size of the lesion and therefore make it less painful and the patient declined. She would like to try Epsom salt soaks first. If she has a need for an injection in the future she will call. Also discussed removal of the soft  tissue lesion as well at this time she like to hold off on any surgical therapies.

## 2015-01-07 ENCOUNTER — Other Ambulatory Visit: Payer: Medicare Other

## 2015-01-07 DIAGNOSIS — E1122 Type 2 diabetes mellitus with diabetic chronic kidney disease: Secondary | ICD-10-CM

## 2015-01-07 DIAGNOSIS — I10 Essential (primary) hypertension: Secondary | ICD-10-CM | POA: Diagnosis not present

## 2015-01-07 DIAGNOSIS — E785 Hyperlipidemia, unspecified: Secondary | ICD-10-CM | POA: Diagnosis not present

## 2015-01-07 DIAGNOSIS — N189 Chronic kidney disease, unspecified: Secondary | ICD-10-CM | POA: Diagnosis not present

## 2015-01-07 DIAGNOSIS — E1129 Type 2 diabetes mellitus with other diabetic kidney complication: Secondary | ICD-10-CM | POA: Diagnosis not present

## 2015-01-08 LAB — COMPREHENSIVE METABOLIC PANEL
ALT: 9 IU/L (ref 0–32)
AST: 17 IU/L (ref 0–40)
Albumin/Globulin Ratio: 1.9 (ref 1.1–2.5)
Albumin: 4.1 g/dL (ref 3.5–4.8)
Alkaline Phosphatase: 63 IU/L (ref 39–117)
BILIRUBIN TOTAL: 0.4 mg/dL (ref 0.0–1.2)
BUN / CREAT RATIO: 17 (ref 11–26)
BUN: 27 mg/dL (ref 8–27)
CHLORIDE: 98 mmol/L (ref 97–108)
CO2: 23 mmol/L (ref 18–29)
CREATININE: 1.61 mg/dL — AB (ref 0.57–1.00)
Calcium: 9 mg/dL (ref 8.7–10.3)
GFR calc non Af Amer: 31 mL/min/{1.73_m2} — ABNORMAL LOW (ref >59)
GFR, EST AFRICAN AMERICAN: 36 mL/min/{1.73_m2} — AB (ref >59)
GLUCOSE: 110 mg/dL — AB (ref 65–99)
Globulin, Total: 2.2 g/dL (ref 1.5–4.5)
Potassium: 5.1 mmol/L (ref 3.5–5.2)
Sodium: 135 mmol/L (ref 134–144)
TOTAL PROTEIN: 6.3 g/dL (ref 6.0–8.5)

## 2015-01-08 LAB — LIPID PANEL
Chol/HDL Ratio: 3.6 ratio units (ref 0.0–4.4)
Cholesterol, Total: 179 mg/dL (ref 100–199)
HDL: 50 mg/dL (ref >39)
LDL Calculated: 116 mg/dL — ABNORMAL HIGH (ref 0–99)
Triglycerides: 65 mg/dL (ref 0–149)
VLDL CHOLESTEROL CAL: 13 mg/dL (ref 5–40)

## 2015-01-08 LAB — MICROALBUMIN, URINE: MICROALBUM., U, RANDOM: 60.6 ug/mL — AB (ref 0.0–17.0)

## 2015-01-08 LAB — HEMOGLOBIN A1C
Est. average glucose Bld gHb Est-mCnc: 148 mg/dL
Hgb A1c MFr Bld: 6.8 % — ABNORMAL HIGH (ref 4.8–5.6)

## 2015-01-12 ENCOUNTER — Ambulatory Visit (INDEPENDENT_AMBULATORY_CARE_PROVIDER_SITE_OTHER): Payer: Medicare Other | Admitting: Internal Medicine

## 2015-01-12 ENCOUNTER — Encounter: Payer: Self-pay | Admitting: Internal Medicine

## 2015-01-12 VITALS — BP 120/78 | HR 53 | Temp 98.0°F | Ht 62.0 in | Wt 149.6 lb

## 2015-01-12 DIAGNOSIS — E1122 Type 2 diabetes mellitus with diabetic chronic kidney disease: Secondary | ICD-10-CM

## 2015-01-12 DIAGNOSIS — N189 Chronic kidney disease, unspecified: Secondary | ICD-10-CM | POA: Diagnosis not present

## 2015-01-12 DIAGNOSIS — M150 Primary generalized (osteo)arthritis: Secondary | ICD-10-CM | POA: Diagnosis not present

## 2015-01-12 DIAGNOSIS — I2510 Atherosclerotic heart disease of native coronary artery without angina pectoris: Secondary | ICD-10-CM

## 2015-01-12 DIAGNOSIS — I739 Peripheral vascular disease, unspecified: Secondary | ICD-10-CM | POA: Diagnosis not present

## 2015-01-12 DIAGNOSIS — E785 Hyperlipidemia, unspecified: Secondary | ICD-10-CM

## 2015-01-12 DIAGNOSIS — M159 Polyosteoarthritis, unspecified: Secondary | ICD-10-CM

## 2015-01-12 DIAGNOSIS — I10 Essential (primary) hypertension: Secondary | ICD-10-CM | POA: Diagnosis not present

## 2015-01-12 NOTE — Progress Notes (Signed)
Patient ID: Kendra Todd, female   DOB: 1941-01-04, 74 y.o.   MRN: [id number]    Facility  [facility]    Place of Service:   OFFICE   Allergies  Allergen Reactions  . Iron Hives  . Norvasc [Amlodipine Besylate] Nausea And Vomiting  . Promethazine Other (See Comments)    hallucinations  . Vicodin [Hydrocodone-Acetaminophen] Other (See Comments)    Caused arrythmia   . Vioxx [Rofecoxib] Nausea And Vomiting    Chief Complaint  Patient presents with  . Medical Management of Chronic Issues    3 Month follow up    HPI:  Type 2 diabetes mellitus with diabetic chronic kidney disease: Chronic disorder under control on present medication  HLD (hyperlipidemia): Controlled  Essential hypertension: Controlled  Coronary artery disease involving native coronary artery of native heart without angina pectoris: Stable. No angina  PAD (peripheral artery disease): Chronic. Painless  Primary osteoarthritis involving multiple joints: Aching in several joints including shoulders, wrists, knees.    Medications: Patient's Medications  New Prescriptions   No medications on file  Previous Medications   ACETAMINOPHEN-CODEINE (TYLENOL #3) 300-30 MG PER TABLET    Take 1 tablet by mouth every 6 (six) hours as needed for moderate pain.   ASPIRIN 81 MG TABLET    Take 81 mg by mouth daily.    BUTRANS 5 MCG/HR PTWK PATCH    Apply topically once a week.   CARVEDILOL (COREG) 25 MG TABLET    Take 1 tablet (25 mg total) by mouth 2 (two) times daily.   CLONIDINE (CATAPRES) 0.1 MG TABLET    Take one tablet by mouth 2 times daily to help control BP   CRESTOR 40 MG TABLET    TAKE 1 TABLET DAILY TO LOWER CHOLESTEROL   LATANOPROST (XALATAN) 0.005 % OPHTHALMIC SOLUTION    Place 1 drop into both eyes at bedtime.   METFORMIN (GLUCOPHAGE) 1000 MG TABLET    Take 1,000 mg by mouth 2 (two) times daily with a meal.   NITROGLYCERIN (NITROSTAT) 0.4 MG SL TABLET    Place 0.4 mg under the tongue every 5 (five) minutes as needed  for chest pain.   VALSARTAN-HYDROCHLOROTHIAZIDE (DIOVAN-HCT) 320-25 MG PER TABLET    TAKE 1 TABLET DAILY FOR BLOOD PRESSURE  Modified Medications   No medications on file  Discontinued Medications   No medications on file     Review of Systems  Constitutional: Negative.   HENT: Negative.   Eyes: Negative.   Respiratory: Negative.   Cardiovascular: Negative for chest pain, palpitations and leg swelling.  Endocrine: Negative for polydipsia, polyphagia and polyuria.       History diabetes  Genitourinary: Positive for urgency and frequency. Negative for flank pain.  Musculoskeletal: Positive for back pain and arthralgias.  Skin: Negative.   Neurological: Negative.   Hematological: Negative.   Psychiatric/Behavioral: Negative.     Filed Vitals:   01/12/15 1213  BP: 120/78  Pulse: 53  Temp: 98 F (36.7 C)  TempSrc: Oral  Height: 5\' 2"  (1.575 m)  Weight: 149 lb 9.6 oz (67.858 kg)   Body mass index is 27.36 kg/(m^2).  Physical Exam  Constitutional: She is oriented to person, place, and time. She appears well-developed and well-nourished. No distress.  HENT:  Head: Normocephalic.  Nose: Nose normal.  Upper and lower dentures present.  Eyes: Conjunctivae are normal. Pupils are equal, round, and reactive to light.  Wears prescription lenses. Right cataract.  Neck: Normal range of motion. Neck supple.  No JVD present. No tracheal deviation present. No thyromegaly present.  Cardiovascular: Normal rate, regular rhythm and normal heart sounds.  Exam reveals no gallop and no friction rub.   No murmur heard. Diminished dorsalis pedis pulses bilaterally and diminished posterior tibial pulses bilaterally.  Pulmonary/Chest: Effort normal and breath sounds normal. No respiratory distress. She has no wheezes. She has no rales. She exhibits no tenderness.  Abdominal: Soft. Bowel sounds are normal. She exhibits no distension and no mass. There is no tenderness.  Musculoskeletal: She  exhibits tenderness. She exhibits no edema.  Pain in multiple joints  Lymphadenopathy:    She has no cervical adenopathy.  Neurological: She is alert and oriented to person, place, and time. She has normal reflexes. No cranial nerve deficit. Coordination normal.  Diminished sensation to vibration and monofilament testing  Skin: Skin is warm and dry. No rash noted. No erythema. No pallor.  Psychiatric: She has a normal mood and affect. Her behavior is normal. Judgment normal.     Labs reviewed: Appointment on 05/12/2015  Component Date Value Ref Range Status  . Microalbum.,U,Random 05/12/2015 60.6* 0.0 - 17.0 ug/mL Final  . Hgb A1c MFr Bld 05/12/2015 6.8* 4.8 - 5.6 % Final   Comment:          Pre-diabetes: 5.7 - 6.4          Diabetes: >6.4          Glycemic control for adults with diabetes: <7.0   . Est. average glucose Bld gHb Est-m* 05/12/2015 148   Final  . Cholesterol, Total 05/12/2015 179  100 - 199 mg/dL Final  . Triglycerides 05/12/2015 65  0 - 149 mg/dL Final  . HDL 05/12/2015 50  >39 mg/dL Final   Comment: According to ATP-III Guidelines, HDL-C >59 mg/dL is considered a negative risk factor for CHD.   [name] VLDL Cholesterol Cal 05/12/2015 13  5 - 40 mg/dL Final  . LDL Calculated 05/12/2015 116* 0 - 99 mg/dL Final  . Chol/HDL Ratio 05/12/2015 3.6  0.0 - 4.4 ratio units Final   Comment:                                   T. Chol/HDL Ratio                                             Men  Women                               1/2 Avg.Risk  3.4    3.3                                   Avg.Risk  5.0    4.4                                2X Avg.Risk  9.6    7.1                                3X Avg.Risk 23.4   11.0   . Glucose  2020/09/1715 110* 65 - 99 mg/dL Final  . BUN 2020/09/1715 27  8 - 27 mg/dL Final  . Creatinine, Ser 2020/09/1715 1.61* 0.57 - 1.00 mg/dL Final  . GFR calc non Af Amer 2020/09/1715 31* >59 mL/min/1.73 Final  . GFR calc Af Amer 2020/09/1715 36* >59 mL/min/1.73 Final  .  BUN/Creatinine Ratio 2020/09/1715 17  11 - 26 Final  . Sodium 2020/09/1715 135  134 - 144 mmol/L Final  . Potassium 2020/09/1715 5.1  3.5 - 5.2 mmol/L Final  . Chloride 2020/09/1715 98  97 - 108 mmol/L Final  . CO2 2020/09/1715 23  18 - 29 mmol/L Final  . Calcium 2020/09/1715 9.0  8.7 - 10.3 mg/dL Final  . Total Protein 2020/09/1715 6.3  6.0 - 8.5 g/dL Final  . Albumin 2020/09/1715 4.1  3.5 - 4.8 g/dL Final  . Globulin, Total 2020/09/1715 2.2  1.5 - 4.5 g/dL Final  . Albumin/Globulin Ratio 2020/09/1715 1.9  1.1 - 2.5 Final  . Bilirubin Total 2020/09/1715 0.4  0.0 - 1.2 mg/dL Final  . Alkaline Phosphatase 2020/09/1715 63  39 - 117 IU/L Final  . AST 2020/09/1715 17  0 - 40 IU/L Final  . ALT 2020/09/1715 9  0 - 32 IU/L Final     Assessment/Plan 1. Type 2 diabetes mellitus with diabetic chronic kidney disease Controlled - Comprehensive metabolic panel; Future - Hemoglobin A1c; Future  2. HLD (hyperlipidemia) Controlled - Lipid panel; Future  3. Essential hypertension Controlled - Comprehensive metabolic panel; Future  4. Coronary artery disease involving native coronary artery of native heart without angina pectoris Stable  5. PAD (peripheral artery disease) Stable 6. Primary osteoarthritis involving multiple joints Stable

## 2015-01-13 ENCOUNTER — Ambulatory Visit: Payer: Medicare Other | Admitting: Vascular Surgery

## 2015-01-13 ENCOUNTER — Ambulatory Visit
Admission: RE | Admit: 2015-01-13 | Discharge: 2015-01-13 | Disposition: A | Discharge status: Home / Self Care | Payer: Medicare Other | Source: Ambulatory Visit | Attending: Vascular Surgery | Admitting: Vascular Surgery

## 2015-01-13 DIAGNOSIS — I716 Thoracoabdominal aortic aneurysm, without rupture, unspecified: Secondary | ICD-10-CM

## 2015-01-13 MED ORDER — IOHEXOL 350 MG/ML SOLN
75.0000 mL | Freq: Once | INTRAVENOUS | Status: AC | PRN
  Administered 2015-01-13: 75 mL via INTRAVENOUS

## 2015-01-18 ENCOUNTER — Encounter: Payer: Self-pay | Admitting: Vascular Surgery

## 2015-01-19 ENCOUNTER — Encounter: Payer: Self-pay | Admitting: Vascular Surgery

## 2015-01-19 ENCOUNTER — Ambulatory Visit (INDEPENDENT_AMBULATORY_CARE_PROVIDER_SITE_OTHER): Payer: Medicare Other | Admitting: Vascular Surgery

## 2015-01-19 VITALS — BP 163/76 | HR 72 | Resp 16 | Ht 62.5 in | Wt 147.0 lb

## 2015-01-19 DIAGNOSIS — I716 Thoracoabdominal aortic aneurysm, without rupture, unspecified: Secondary | ICD-10-CM

## 2015-01-19 DIAGNOSIS — I6523 Occlusion and stenosis of bilateral carotid arteries: Secondary | ICD-10-CM | POA: Diagnosis not present

## 2015-01-19 NOTE — Progress Notes (Signed)
[facility]  [facility]ISTORY [facility] PHYSICAL    History of Present Illness:  Patient is a 74 y.o. 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  She is on coumadin for afib.  His atherosclerotic risk factors remain afib, diabetes, elevated cholesterol, smoking, and coronary artery disease.  These are all currently stable and followed by her primary care physician.  she has had poor control of her blood pressure recently. She states it has been in the 160V to 371 systolic at home. In the ER and at our office recently he was in the 062I to 948 systolic.  She denies any new neurologic events including amaurosis, numbness, or weakness. Previously had a right carotid endarterectomy by Dr. Amedeo Plenty in 2001. She also had a previous left carotid endarterectomy and redo carotid endarterectomy in 2001. Initial carotid was done by Dr. Amedeo Plenty the redo with Dr. Donnetta Hutching.    She is sent for followup today for evaluation of a thoracoabdominal aneurysm. She recently presented to the emergency room complaining of pain in her left shoulder and back. She has had a previous back operations.  She also complains of occasional left anterior chest pain with radiation to the left side. She states this is been present since her coronary bypass grafting in 2014. She has had no symptoms of syncope or collapse. She was given Percocet for pain control by the emergency room.    Past Medical History   Diagnosis  Date   .  Arthritis     .  Atrial fibrillation  02/21/2011   .  Hypertension     .  Hyperlipidemia     .  Myocardial infarction     .  Diabetes mellitus  age 48   .  CAD (coronary artery disease)         prior coronary stenting   .  Stroke  2001   .  Peripheral vascular disease     .  Carotid artery disease  2001       s/p Bilateral CEA, after CVA   .  TR (tricuspid regurgitation), severe 12/02/12 for repair with CABG  12/03/2012   .  Pulmonary HTN  12/03/2012   .  Dyspnea  03/13/2006        Myoview - EF 76%; mild ischemia in apical lateral region, less severe than previous study in 2002   .  H/O endarterectomy  09/11/2012       patent L and R carotid sites, R 60-79% restenosed ICA; L <40% restenosed ICA   .  Stenosis of artery  06/03/2012       doppler- most distal aspect of abd aorta no aneurysmal dilation; bilateral ABIs - mild L arterial insufficiency, L CIA narrowing w/ 50-69% diameter reduction; L and R SFA both w/ 0-49% diameter reductions   .  H/O endarterectomy  01/08/2012       doppler - R distal common carotid/proximal ICA stenosed 80-99%, L 0-39%   .  Decreased pedal pulses  06/04/2011       doppler - bilateral ABIs no evidence of insufficiency; L CIA slightly elevated velocities 20-30% diameter reduction;    .  Limb pain  05/02/2011       doppler - no evidence of thrombus of thrombophlebitis   .  Diabetes     .  H/O carotid endarterectomy  03/06/2012       CT angio -high grade stenosis of proximal R internal carotid w/ lg posterior ulceration  or dissection flap; near occlusive stenosis at origin of nondominantproximal L vertebral artery; <50% stenosis of proximal R vertebral and proximal L subclavian artery   .  Urinary tract infection, site not specified     .  Tobacco use disorder     .  Unspecified cataract     .  Myalgia and myositis, unspecified     .  Cervicalgia     .  Acute upper respiratory infections of unspecified site     .  Tobacco use disorder     .  Peripheral vascular disease, unspecified     .  Atrial fibrillation     .  Phlebitis and thrombophlebitis of superficial vessels of lower extremities     .  Edema     .  Long term (current) use of anticoagulants     .  Loss of weight     .  Abdominal pain, epigastric     .  Atrial fibrillation     .  Benign paroxysmal positional vertigo     .  Type II or unspecified type diabetes mellitus with peripheral circulatory disorders, uncontrolled(250.72)     .  Unspecified vitamin D deficiency     .  Other  manifestations of vitamin A deficiency     .  Type II or unspecified type diabetes mellitus with peripheral circulatory disorders, not stated as uncontrolled(250.70)     .  Primary localized osteoarthrosis, hand     .  Abdominal pain, epigastric     .  Nontoxic uninodular goiter     .  Nonspecific abnormal results of liver function study     .  Candidiasis of vulva and vagina     .  Routine gynecological examination     .  Unspecified disorder of iris and ciliary body     .  Spinal stenosis, unspecified region other than cervical     .  Other malaise and fatigue     .  Pain in joint, pelvic region and thigh     .  Cervicalgia     .  Other and unspecified hyperlipidemia     .  Edema     .  Coronary atherosclerosis of unspecified type of vessel, native or graft     .  Pain in limb     .  Osteoarthrosis, unspecified whether generalized or localized, unspecified site     .  Occlusion and stenosis of carotid artery without mention of cerebral infarction     .  Lumbago     .  Myalgia and myositis, unspecified     .  Renal artery stenosis         Past Surgical History   Procedure  Laterality  Date   .  Carotid endarterectomy    2001       Bilateral   .  Cholecystectomy       .  Kidney stone surgery           Removal   .  Back surgery    [phone]       three previous surgeries   .  Angioplasty    1984 and 1982   .  Abdominal hysterectomy    1975   .  Percutaneous coronary stent intervention (pci-s)    2002       2 BMS in ostial/Prox & mid RCA (mid 2.5 mm 20x  mm, & 2.75 mm x  8 mm ostial)   .  Coronary artery bypass graft    12/04/2012       Procedure: CORONARY ARTERY BYPASS GRAFTING (CABG);  Surgeon: Ivin Poot, MD;  Location: [facility];  Service: Open Heart Surgery;  Laterality: N/A;   .  Intraoperative transesophageal echocardiogram    12/04/2012       Procedure: INTRAOPERATIVE TRANSESOPHAGEAL ECHOCARDIOGRAM;  Surgeon: Ivin Poot, MD;  Location: [facility];  Service: Open  Heart Surgery;  Laterality: N/A;   .  Appendectomy    1954   .  Tee with cardioversion    01/23/2011       EF 50-09%; grade 2 diastolic dysfunction, elevated mean LA filling pressure, RV systolic pressure increased consistent w/ mod pulmonary hypertension   .  Arch aortogram    03/30/2011       40% recurrent stenosis at origin of R proximal carotid patch, shelf like recurrent stenosis w/in midsection of patch that does not appear to be flow limiting; normal non-stenosed great vessel origins     Social History History   Substance Use Topics   .  Smoking status:  Former Smoker -- 0.30 packs/day for 57 years       Types:  Cigarettes       Quit date:  11/19/2012   .  Smokeless tobacco:  Never Used         Comment: pt states she is working on it but husband just passed   .  Alcohol Use:  0.6 oz/week       1 Glasses of wine per week     Family History Family History   Problem  Relation  Age of Onset   .  Other  Mother         CVA   .  Diabetes  Mother     .  Heart disease  Mother         Heart disease before age 1   .  Heart attack  Mother     .  Heart disease  Father         Heart Disease before age 72   .  Heart attack  Father     .  Heart disease  Sister         Amputation   .  Diabetes  Sister     .  Heart disease  Brother         Heart disease before age 21   .  Diabetes  Brother     .  Stroke  Brother     .  Stroke  Brother     .  Heart disease  Brother     .  Stroke  Brother     .  Heart disease  Brother     .  Heart disease  Sister     .  Heart disease  Sister     .  Diabetes  Other     .  Cancer  Other         breast   .  Heart disease  Other         cad   .  Other  Other         cva   .  Diabetes  Daughter     .  Cancer  Sister       Allergies    Allergies   Allergen  Reactions   .  Iron  Hives   .  Norvasc [Amlodipine Besylate]  Nausea  And Vomiting   .  Promethazine  Other (See Comments)       hallucinations   .  Vicodin [Hydrocodone-Acetaminophen]   Other (See Comments)       Caused arrythmia    .  Vioxx [Rofecoxib]  Nausea And Vomiting        Current Outpatient Prescriptions on File Prior to Visit  Medication Sig Dispense Refill  . acetaminophen-codeine (TYLENOL #3) 300-30 MG per tablet Take 1 tablet by mouth every 6 (six) hours as needed for moderate pain. 120 tablet 0  . aspirin 81 MG tablet Take 81 mg by mouth daily.     [name] BUTRANS 5 MCG/HR PTWK patch Apply topically once a week.  3  . carvedilol (COREG) 25 MG tablet Take 1 tablet (25 mg total) by mouth 2 (two) times daily. 180 tablet 3  . CRESTOR 40 MG tablet TAKE 1 TABLET DAILY TO LOWER CHOLESTEROL 90 tablet 1  . latanoprost (XALATAN) 0.005 % ophthalmic solution Place 1 drop into both eyes at bedtime.    . metFORMIN (GLUCOPHAGE) 1000 MG tablet Take 1,000 mg by mouth 2 (two) times daily with a meal.    . nitroGLYCERIN (NITROSTAT) 0.4 MG SL tablet Place 0.4 mg under the tongue every 5 (five) minutes as needed for chest pain.    . valsartan-hydrochlorothiazide (DIOVAN-HCT) 320-25 MG per tablet TAKE 1 TABLET DAILY FOR BLOOD PRESSURE 90 tablet 1  . cloNIDine (CATAPRES) 0.1 MG tablet Take one tablet by mouth 2 times daily to help control BP (Patient not taking: Reported on 01/12/2015) 180 tablet 3   No current facility-administered medications on file prior to visit.    ROS:    General:  No weight loss, Fever, chills  HEENT: No recent headaches, no nasal bleeding, no visual changes, no sore throat  Neurologic: No dizziness, blackouts, seizures. No recent symptoms of stroke or mini- stroke. No recent episodes of slurred speech, or temporary blindness.  Cardiac: + recent episodes of chest pain/pressure, no shortness of breath at rest.  No shortness of breath with exertion.  +history of atrial fibrillation or irregular heartbeat  Vascular: No history of rest pain in feet.  No history of claudication.  No history of non-healing ulcer, No history of DVT    Pulmonary: No home oxygen, no  productive cough, no hemoptysis,  No asthma or wheezing  Musculoskeletal:  [ ]  Arthritis, [ ]  Low back pain,  [ ]  Joint pain  Hematologic:No history of hypercoagulable state.  No history of easy bleeding.  No history of anemia  Gastrointestinal: No hematochezia or melena,  No gastroesophageal reflux, no trouble swallowing  Urinary: [ ]  chronic Kidney disease, [ ]  on HD - [ ]  MWF or [ ]  TTHS, [ ]  Burning with urination, [ ]  Frequent urination, [ ]  Difficulty urinating;    Skin: No rashes  Psychological: No history of anxiety,  No history of depression   Physical Examination    Filed Vitals:   01/19/15 1413 01/19/15 1414  BP: 180/67 163/76  Pulse: 64 72  Resp: 16   Height: 5' 2.5" (1.588 m)   Weight: 147 lb (66.679 kg)     General:  Alert and oriented, no acute distress HEENT: Normal Neck: Bilateral soft carotid bruits Pulmonary: Clear to auscultation bilaterally Cardiac: Regular Rate and Rhythm without 2/6 diastolic murmur Abdomen: Soft, non-tender, non-distended, no mass Skin: No rash Extremity Pulses:  2+ radial, brachial, femoral, absent dorsalis pedis pulses bilaterally Musculoskeletal: No deformity or edema  Neurologic: Upper and lower extremity motor 5/5 and symmetric  DATA:  CT angiogram from 01/13/2015 is reviewed.  This is compared to a CT scan of the chest performed in January 2014. Descending thoracic and upper abdominal aorta was 3.4 cm in January 2014. This area of the aorta now measures 4 cm which is unchanged from 6 months ago. The aneurysm begins at the level of the left subclavian artery and extends to the peri-renal segment making this a type II thoracoabdominal aneurysm.  The abdominal component is 3.8 cm in diameter which is also unchanged.   ASSESSMENT: Type II thoracoabdominal aneurysm no significant change in the last 6 months. We would consider repair of this at 6 cm or greater. I'm not sure whether her shoulder pain is associated with this or not.  She has complained of some anterior chest and shoulder pain in the past. Hypertensive today. Patient states her blood pressure sometimes as low and sometimes is high. Patient also with previous carotid endarterectomy and redo carotid endarterectomy. Currently asymptomatic no symptoms of TIA amaurosis or stroke  PLAN:  #1 needs repeat carotid duplex scan for follow-up of recurrent stenosis. We will arrange this in the next few weeks. Her last duplex was one year ago.  #2 followup CT Angio chest abdomen pelvis in 6 months.  Ruta Hinds, MD [facility] Office: [phone] Pager: [phone]

## 2015-01-20 NOTE — Addendum Note (Signed)
Addended by: Reola Calkins on: 01/20/2015 03:57 PM   Modules accepted: Orders

## 2015-01-24 DIAGNOSIS — H2513 Age-related nuclear cataract, bilateral: Secondary | ICD-10-CM | POA: Diagnosis not present

## 2015-01-24 DIAGNOSIS — H4011X1 Primary open-angle glaucoma, mild stage: Secondary | ICD-10-CM | POA: Diagnosis not present

## 2015-01-26 ENCOUNTER — Other Ambulatory Visit: Payer: Self-pay | Admitting: Vascular Surgery

## 2015-01-26 ENCOUNTER — Ambulatory Visit (HOSPITAL_COMMUNITY)
Admission: RE | Admit: 2015-01-26 | Discharge: 2015-01-26 | Disposition: A | Discharge status: Home / Self Care | Payer: Medicare Other | Source: Ambulatory Visit | Attending: Vascular Surgery | Admitting: Vascular Surgery

## 2015-01-26 DIAGNOSIS — Z48812 Encounter for surgical aftercare following surgery on the circulatory system: Secondary | ICD-10-CM | POA: Diagnosis not present

## 2015-01-26 DIAGNOSIS — I716 Thoracoabdominal aortic aneurysm, without rupture: Secondary | ICD-10-CM | POA: Insufficient documentation

## 2015-01-26 DIAGNOSIS — I6523 Occlusion and stenosis of bilateral carotid arteries: Secondary | ICD-10-CM

## 2015-05-06 ENCOUNTER — Other Ambulatory Visit: Payer: Medicare Other

## 2015-05-06 DIAGNOSIS — I10 Essential (primary) hypertension: Secondary | ICD-10-CM | POA: Diagnosis not present

## 2015-05-06 DIAGNOSIS — E785 Hyperlipidemia, unspecified: Secondary | ICD-10-CM

## 2015-05-06 DIAGNOSIS — E119 Type 2 diabetes mellitus without complications: Secondary | ICD-10-CM

## 2015-05-06 DIAGNOSIS — E1122 Type 2 diabetes mellitus with diabetic chronic kidney disease: Secondary | ICD-10-CM

## 2015-05-06 DIAGNOSIS — N189 Chronic kidney disease, unspecified: Secondary | ICD-10-CM | POA: Diagnosis not present

## 2015-05-07 LAB — COMPREHENSIVE METABOLIC PANEL
ALK PHOS: 60 IU/L (ref 39–117)
ALT: 10 IU/L (ref 0–32)
AST: 16 IU/L (ref 0–40)
Albumin/Globulin Ratio: 1.6 (ref 1.1–2.5)
Albumin: 3.9 g/dL (ref 3.5–4.8)
BUN/Creatinine Ratio: 15 (ref 11–26)
BUN: 21 mg/dL (ref 8–27)
Bilirubin Total: 0.6 mg/dL (ref 0.0–1.2)
CALCIUM: 9.4 mg/dL (ref 8.7–10.3)
CHLORIDE: 97 mmol/L (ref 97–108)
CO2: 25 mmol/L (ref 18–29)
Creatinine, Ser: 1.39 mg/dL — ABNORMAL HIGH (ref 0.57–1.00)
GFR, EST AFRICAN AMERICAN: 42 mL/min/{1.73_m2} — AB (ref >59)
GFR, EST NON AFRICAN AMERICAN: 37 mL/min/{1.73_m2} — AB (ref >59)
Globulin, Total: 2.5 g/dL (ref 1.5–4.5)
Glucose: 105 mg/dL — ABNORMAL HIGH (ref 65–99)
Potassium: 5 mmol/L (ref 3.5–5.2)
Sodium: 136 mmol/L (ref 134–144)
TOTAL PROTEIN: 6.4 g/dL (ref 6.0–8.5)

## 2015-05-07 LAB — LIPID PANEL
CHOL/HDL RATIO: 3.8 ratio (ref 0.0–4.4)
Cholesterol, Total: 184 mg/dL (ref 100–199)
HDL: 48 mg/dL (ref >39)
LDL Calculated: 119 mg/dL — ABNORMAL HIGH (ref 0–99)
Triglycerides: 83 mg/dL (ref 0–149)
VLDL CHOLESTEROL CAL: 17 mg/dL (ref 5–40)

## 2015-05-07 LAB — HEMOGLOBIN A1C
ESTIMATED AVERAGE GLUCOSE: 143 mg/dL
Hgb A1c MFr Bld: 6.6 % — ABNORMAL HIGH (ref 4.8–5.6)

## 2015-05-11 ENCOUNTER — Ambulatory Visit (INDEPENDENT_AMBULATORY_CARE_PROVIDER_SITE_OTHER): Payer: Medicare Other | Admitting: Internal Medicine

## 2015-05-11 ENCOUNTER — Encounter: Payer: Self-pay | Admitting: Internal Medicine

## 2015-05-11 VITALS — BP 140/72 | HR 65 | Temp 98.2°F | Resp 18 | Ht 63.0 in | Wt 144.4 lb

## 2015-05-11 DIAGNOSIS — I6523 Occlusion and stenosis of bilateral carotid arteries: Secondary | ICD-10-CM | POA: Diagnosis not present

## 2015-05-11 DIAGNOSIS — I10 Essential (primary) hypertension: Secondary | ICD-10-CM

## 2015-05-11 DIAGNOSIS — M150 Primary generalized (osteo)arthritis: Secondary | ICD-10-CM

## 2015-05-11 DIAGNOSIS — I82890 Acute embolism and thrombosis of other specified veins: Secondary | ICD-10-CM

## 2015-05-11 DIAGNOSIS — E785 Hyperlipidemia, unspecified: Secondary | ICD-10-CM

## 2015-05-11 DIAGNOSIS — N189 Chronic kidney disease, unspecified: Secondary | ICD-10-CM

## 2015-05-11 DIAGNOSIS — E1122 Type 2 diabetes mellitus with diabetic chronic kidney disease: Secondary | ICD-10-CM

## 2015-05-11 DIAGNOSIS — M159 Polyosteoarthritis, unspecified: Secondary | ICD-10-CM

## 2015-05-11 DIAGNOSIS — I2510 Atherosclerotic heart disease of native coronary artery without angina pectoris: Secondary | ICD-10-CM | POA: Diagnosis not present

## 2015-05-11 HISTORY — DX: Acute embolism and thrombosis of other specified veins: I82.890

## 2015-05-11 MED ORDER — ACETAMINOPHEN-CODEINE #3 300-30 MG PO TABS: ORAL_TABLET | ORAL | Status: DC

## 2015-05-11 NOTE — Progress Notes (Signed)
Patient ID: Kendra Todd, female   DOB: [birth year], 74 y.o.   MRN: [id number]    Facility  [facility]    Place of Service:   OFFICE    Allergies  Allergen Reactions  . Iron Hives  . Norvasc [Amlodipine Besylate] Nausea And Vomiting  . Promethazine Other (See Comments)    hallucinations  . Vicodin [Hydrocodone-Acetaminophen] Other (See Comments)    Caused arrythmia   . Vioxx [Rofecoxib] Nausea And Vomiting    Chief Complaint  Patient presents with  . Follow-up    HPI:  Superficial vein thrombosis: tender at the left forehead for the last couple of weeks. No headache. No visual disturbance. One inch long superficial vein thrombosis. No involvement of the temporal artery.  Type 2 diabetes mellitus with diabetic chronic kidney disease -controlled  HLD (hyperlipidemia) - controlled  Essential hypertension: controlled  Coronary artery disease involving native coronary artery of native heart without angina pectoris: stable  Primary osteoarthritis involving multiple joints - benefits from acetaminophen-codeine (TYLENOL #3) 300-30 MG per tablet    Medications: Patient's Medications  New Prescriptions   No medications on file  Previous Medications   ACETAMINOPHEN-CODEINE (TYLENOL #3) 300-30 MG PER TABLET    Take 1 tablet by mouth every 6 (six) hours as needed for moderate pain.   ASPIRIN 81 MG TABLET    Take 81 mg by mouth daily.    CARVEDILOL (COREG) 25 MG TABLET    Take 1 tablet (25 mg total) by mouth 2 (two) times daily.   CRESTOR 40 MG TABLET    TAKE 1 TABLET DAILY TO LOWER CHOLESTEROL   LATANOPROST (XALATAN) 0.005 % OPHTHALMIC SOLUTION    Place 1 drop into both eyes at bedtime.   METFORMIN (GLUCOPHAGE) 1000 MG TABLET    Take 1,000 mg by mouth 2 (two) times daily with a meal.   NITROGLYCERIN (NITROSTAT) 0.4 MG SL TABLET    Place 0.4 mg under the tongue every 5 (five) minutes as needed for chest pain.   VALSARTAN-HYDROCHLOROTHIAZIDE (DIOVAN-HCT) 320-25 MG PER TABLET    TAKE 1  TABLET DAILY FOR BLOOD PRESSURE  Modified Medications   No medications on file  Discontinued Medications   BUTRANS 5 MCG/HR PTWK PATCH    Apply topically once a week.   CLONIDINE (CATAPRES) 0.1 MG TABLET    Take one tablet by mouth 2 times daily to help control BP     Review of Systems  Constitutional: Negative.   HENT: Negative.   Eyes: Negative.   Respiratory: Negative.   Cardiovascular: Negative for chest pain, palpitations and leg swelling.  Endocrine: Negative for polydipsia, polyphagia and polyuria.       History diabetes  Genitourinary: Positive for urgency and frequency. Negative for flank pain.  Musculoskeletal: Positive for back pain and arthralgias.  Skin: Negative.   Neurological: Negative.   Hematological: Negative.   Psychiatric/Behavioral: Negative.     Filed Vitals:   05/11/15 1239  BP: 140/72  Pulse: 65  Temp: 98.2 F (36.8 C)  TempSrc: Oral  Resp: 18  Height: 5\' 3"  (1.6 m)  Weight: 144 lb 6.4 oz (65.499 kg)  SpO2: 98%   Body mass index is 25.59 kg/(m^2).  Physical Exam  Constitutional: She is oriented to person, place, and time. She appears well-developed and well-nourished. No distress.  HENT:  Head: Normocephalic.  Nose: Nose normal.  Upper and lower dentures present.  Eyes: Conjunctivae are normal. Pupils are equal, round, and reactive to light.  Wears prescription lenses.  Right cataract.  Neck: Normal range of motion. Neck supple. No JVD present. No tracheal deviation present. No thyromegaly present.  Cardiovascular: Normal rate, regular rhythm and normal heart sounds.  Exam reveals no gallop and no friction rub.   No murmur heard. Diminished dorsalis pedis pulses bilaterally and diminished posterior tibial pulses bilaterally.  Pulmonary/Chest: Effort normal and breath sounds normal. No respiratory distress. She has no wheezes. She has no rales. She exhibits no tenderness.  Abdominal: Soft. Bowel sounds are normal. She exhibits no distension  and no mass. There is no tenderness.  Musculoskeletal: She exhibits tenderness. She exhibits no edema.  Pain in multiple joints  Lymphadenopathy:    She has no cervical adenopathy.  Neurological: She is alert and oriented to person, place, and time. She has normal reflexes. No cranial nerve deficit. Coordination normal.  Diminished sensation to vibration and monofilament testing  Skin: Skin is warm and dry. No rash noted. No erythema. No pallor.  Psychiatric: She has a normal mood and affect. Her behavior is normal. Judgment normal.     Labs reviewed: Appointment on 05/06/2015  Component Date Value Ref Range Status  . Hgb A1c MFr Bld 05/06/2015 6.6* 4.8 - 5.6 % Final   Comment:          Pre-diabetes: 5.7 - 6.4          Diabetes: >6.4          Glycemic control for adults with diabetes: <7.0   . Est. average glucose Bld gHb Est-m* 05/06/2015 143   Final  . Cholesterol, Total 05/06/2015 184  100 - 199 mg/dL Final  . Triglycerides 05/06/2015 83  0 - 149 mg/dL Final  . HDL 05/06/2015 48  >39 mg/dL Final   Comment: According to ATP-III Guidelines, HDL-C >59 mg/dL is considered a negative risk factor for CHD.   [name] VLDL Cholesterol Cal 05/06/2015 17  5 - 40 mg/dL Final  . LDL Calculated 05/06/2015 119* 0 - 99 mg/dL Final  . Chol/HDL Ratio 05/06/2015 3.8  0.0 - 4.4 ratio units Final   Comment:                                   T. Chol/HDL Ratio                                             Men  Women                               1/2 Avg.Risk  3.4    3.3                                   Avg.Risk  5.0    4.4                                2X Avg.Risk  9.6    7.1                                3X Avg.Risk 23.4   11.0   . Glucose  05/06/2015 105* 65 - 99 mg/dL Final  . BUN 05/06/2015 21  8 - 27 mg/dL Final  . Creatinine, Ser 05/06/2015 1.39* 0.57 - 1.00 mg/dL Final  . GFR calc non Af Amer 05/06/2015 37* >59 mL/min/1.73 Final  . GFR calc Af Amer 05/06/2015 42* >59 mL/min/1.73 Final  .  BUN/Creatinine Ratio 05/06/2015 15  11 - 26 Final  . Sodium 05/06/2015 136  134 - 144 mmol/L Final  . Potassium 05/06/2015 5.0  3.5 - 5.2 mmol/L Final  . Chloride 05/06/2015 97  97 - 108 mmol/L Final  . CO2 05/06/2015 25  18 - 29 mmol/L Final  . Calcium 05/06/2015 9.4  8.7 - 10.3 mg/dL Final  . Total Protein 05/06/2015 6.4  6.0 - 8.5 g/dL Final  . Albumin 05/06/2015 3.9  3.5 - 4.8 g/dL Final  . Globulin, Total 05/06/2015 2.5  1.5 - 4.5 g/dL Final  . Albumin/Globulin Ratio 05/06/2015 1.6  1.1 - 2.5 Final  . Bilirubin Total 05/06/2015 0.6  0.0 - 1.2 mg/dL Final  . Alkaline Phosphatase 05/06/2015 60  39 - 117 IU/L Final  . AST 05/06/2015 16  0 - 40 IU/L Final  . ALT 05/06/2015 10  0 - 32 IU/L Final     Assessment/Plan  1. Superficial vein thrombosis Try Aspercreme 4 times daily to painful vein left forehead.  2. Type 2 diabetes mellitus with diabetic chronic kidney disease Controlled - Hemoglobin A1c; Future - Basic metabolic panel; Future  3. HLD (hyperlipidemia) Controlled - Lipid panel; Future  4. Essential hypertension Controlled  5. Coronary artery disease involving native coronary artery of native heart without angina pectoris Occasional angina  6. Primary osteoarthritis involving multiple joints - acetaminophen-codeine (TYLENOL #3) 300-30 MG per tablet; One every 6 hours as needed to control pain  Dispense: 120 tablet; Refill: 5

## 2015-05-11 NOTE — Patient Instructions (Signed)
Try Aspercream 4 times daily to the tender vein in the left forehead.

## 2015-05-21 ENCOUNTER — Other Ambulatory Visit: Payer: Self-pay | Admitting: Internal Medicine

## 2015-05-25 ENCOUNTER — Ambulatory Visit (INDEPENDENT_AMBULATORY_CARE_PROVIDER_SITE_OTHER): Payer: Medicare Other | Admitting: Internal Medicine

## 2015-05-25 ENCOUNTER — Encounter: Payer: Self-pay | Admitting: Internal Medicine

## 2015-05-25 VITALS — HR 59 | Temp 98.9°F | Resp 20 | Ht 63.0 in | Wt 148.4 lb

## 2015-05-25 DIAGNOSIS — M150 Primary generalized (osteo)arthritis: Secondary | ICD-10-CM | POA: Diagnosis not present

## 2015-05-25 DIAGNOSIS — I6523 Occlusion and stenosis of bilateral carotid arteries: Secondary | ICD-10-CM

## 2015-05-25 DIAGNOSIS — M159 Polyosteoarthritis, unspecified: Secondary | ICD-10-CM

## 2015-05-25 DIAGNOSIS — B029 Zoster without complications: Secondary | ICD-10-CM | POA: Insufficient documentation

## 2015-05-25 MED ORDER — VALACYCLOVIR HCL 1 G PO TABS: ORAL_TABLET | ORAL | Status: DC

## 2015-05-25 MED ORDER — ACETAMINOPHEN-CODEINE #3 300-30 MG PO TABS: ORAL_TABLET | ORAL | Status: DC

## 2015-05-25 NOTE — Progress Notes (Signed)
Patient ID: Kendra Todd, female   DOB: 05-01-1941, 74 y.o.   MRN: [id number]    Facility  [facility]    Place of Service:   OFFICE    Allergies  Allergen Reactions  . Iron Hives  . Norvasc [Amlodipine Besylate] Nausea And Vomiting  . Promethazine Other (See Comments)    hallucinations  . Vicodin [Hydrocodone-Acetaminophen] Other (See Comments)    Caused arrythmia   . Vioxx [Rofecoxib] Nausea And Vomiting    Chief Complaint  Patient presents with  . Acute Visit    has rash under her left breast spreading to the back    HPI:  Herpes zoster: Painful vesicular rash in blotches on the left trunk at about the T7 dermatome present for at least 6 days. She gives a history of having had shingles 8 years ago and about the same area.  Primary osteoarthritis involving multiple joints - requesting renewal of acetaminophen-codeine (TYLENOL #3) 300-30 MG per tablet      Medications: Patient's Medications  New Prescriptions   No medications on file  Previous Medications   ACETAMINOPHEN-CODEINE (TYLENOL #3) 300-30 MG PER TABLET    One every 6 hours as needed to control pain   ASPIRIN 81 MG TABLET    Take 81 mg by mouth daily.    CARVEDILOL (COREG) 25 MG TABLET    Take 1 tablet (25 mg total) by mouth 2 (two) times daily.   CRESTOR 40 MG TABLET    TAKE 1 TABLET DAILY TO LOWER CHOLESTEROL   LATANOPROST (XALATAN) 0.005 % OPHTHALMIC SOLUTION    Place 1 drop into both eyes at bedtime.   METFORMIN (GLUCOPHAGE) 1000 MG TABLET    Take 1,000 mg by mouth 2 (two) times daily with a meal.   NITROGLYCERIN (NITROSTAT) 0.4 MG SL TABLET    Place 0.4 mg under the tongue every 5 (five) minutes as needed for chest pain.   VALSARTAN-HYDROCHLOROTHIAZIDE (DIOVAN-HCT) 320-25 MG PER TABLET    TAKE 1 TABLET DAILY FOR BLOOD PRESSURE  Modified Medications   No medications on file  Discontinued Medications   No medications on file     Review of Systems  Constitutional: Negative.   HENT: Negative.   Eyes:  Negative.   Respiratory: Negative.   Cardiovascular: Negative for chest pain, palpitations and leg swelling.  Endocrine: Negative for polydipsia, polyphagia and polyuria.       History diabetes  Genitourinary: Positive for urgency and frequency. Negative for flank pain.  Musculoskeletal: Positive for back pain and arthralgias.  Skin:       New rash on the left chest wall that is painful.  Neurological: Negative.   Hematological: Negative.   Psychiatric/Behavioral: Negative.     Filed Vitals:   05/25/15 1554  Pulse: 59  Temp: 98.9 F (37.2 C)  TempSrc: Oral  Resp: 20  Height: 5\' 3"  (1.6 m)  Weight: 148 lb 6.4 oz (67.314 kg)  SpO2: 98%   Body mass index is 26.29 kg/(m^2).  Physical Exam  Constitutional: She is oriented to person, place, and time. She appears well-developed and well-nourished. No distress.  HENT:  Head: Normocephalic.  Nose: Nose normal.  Upper and lower dentures present.  Eyes: Conjunctivae are normal. Pupils are equal, round, and reactive to light.  Wears prescription lenses. Right cataract.  Neck: Normal range of motion. Neck supple. No JVD present. No tracheal deviation present. No thyromegaly present.  Cardiovascular: Normal rate, regular rhythm and normal heart sounds.  Exam reveals no gallop and  no friction rub.   No murmur heard. Diminished dorsalis pedis pulses bilaterally and diminished posterior tibial pulses bilaterally.  Pulmonary/Chest: Effort normal and breath sounds normal. No respiratory distress. She has no wheezes. She has no rales. She exhibits no tenderness.  Abdominal: Soft. Bowel sounds are normal. She exhibits no distension and no mass. There is no tenderness.  Musculoskeletal: She exhibits tenderness. She exhibits no edema.  Pain in multiple joints  Lymphadenopathy:    She has no cervical adenopathy.  Neurological: She is alert and oriented to person, place, and time. She has normal reflexes. No cranial nerve deficit. Coordination  normal.  Diminished sensation to vibration and monofilament testing  Skin: Skin is warm and dry. No rash noted. No erythema. No pallor.  Vesicular rash of the left chest wall with clumps of vesicles extending from the sternum around to the posterior chest..  Psychiatric: She has a normal mood and affect. Her behavior is normal. Judgment normal.     Labs reviewed: Appointment on 05/06/2015  Component Date Value Ref Range Status  . Hgb A1c MFr Bld 05/06/2015 6.6* 4.8 - 5.6 % Final   Comment:          Pre-diabetes: 5.7 - 6.4          Diabetes: >6.4          Glycemic control for adults with diabetes: <7.0   . Est. average glucose Bld gHb Est-m* 05/06/2015 143   Final  . Cholesterol, Total 05/06/2015 184  100 - 199 mg/dL Final  . Triglycerides 05/06/2015 83  0 - 149 mg/dL Final  . HDL 05/06/2015 48  >39 mg/dL Final   Comment: According to ATP-III Guidelines, HDL-C >59 mg/dL is considered a negative risk factor for CHD.   [name] VLDL Cholesterol Cal 05/06/2015 17  5 - 40 mg/dL Final  . LDL Calculated 05/06/2015 119* 0 - 99 mg/dL Final  . Chol/HDL Ratio 05/06/2015 3.8  0.0 - 4.4 ratio units Final   Comment:                                   T. Chol/HDL Ratio                                             Men  Women                               1/2 Avg.Risk  3.4    3.3                                   Avg.Risk  5.0    4.4                                2X Avg.Risk  9.6    7.1                                3X Avg.Risk 23.4   11.0   . Glucose 05/06/2015 105* 65 - 99 mg/dL Final  . BUN 05/06/2015 21  8 -  27 mg/dL Final  . Creatinine, Ser 05/06/2015 1.39* 0.57 - 1.00 mg/dL Final  . GFR calc non Af Amer 05/06/2015 37* >59 mL/min/1.73 Final  . GFR calc Af Amer 05/06/2015 42* >59 mL/min/1.73 Final  . BUN/Creatinine Ratio 05/06/2015 15  11 - 26 Final  . Sodium 05/06/2015 136  134 - 144 mmol/L Final  . Potassium 05/06/2015 5.0  3.5 - 5.2 mmol/L Final  . Chloride 05/06/2015 97  97 - 108 mmol/L  Final  . CO2 05/06/2015 25  18 - 29 mmol/L Final  . Calcium 05/06/2015 9.4  8.7 - 10.3 mg/dL Final  . Total Protein 05/06/2015 6.4  6.0 - 8.5 g/dL Final  . Albumin 05/06/2015 3.9  3.5 - 4.8 g/dL Final  . Globulin, Total 05/06/2015 2.5  1.5 - 4.5 g/dL Final  . Albumin/Globulin Ratio 05/06/2015 1.6  1.1 - 2.5 Final  . Bilirubin Total 05/06/2015 0.6  0.0 - 1.2 mg/dL Final  . Alkaline Phosphatase 05/06/2015 60  39 - 117 IU/L Final  . AST 05/06/2015 16  0 - 40 IU/L Final  . ALT 05/06/2015 10  0 - 32 IU/L Final     Assessment/Plan  1. Herpes zoster - valACYclovir (VALTREX) 1000 MG tablet; One twice daily to treat shingles  Dispense: 20 tablet; Refill: 0  2. Primary osteoarthritis involving multiple joints - acetaminophen-codeine (TYLENOL #3) 300-30 MG per tablet; One every 6 hours as needed to control pain  Dispense: 120 tablet; Refill: 5

## 2015-06-08 ENCOUNTER — Encounter: Payer: Self-pay | Admitting: Internal Medicine

## 2015-06-08 ENCOUNTER — Ambulatory Visit (INDEPENDENT_AMBULATORY_CARE_PROVIDER_SITE_OTHER): Payer: Medicare Other | Admitting: Internal Medicine

## 2015-06-08 VITALS — BP 118/78 | HR 94 | Temp 98.1°F | Ht 63.0 in | Wt 145.0 lb

## 2015-06-08 DIAGNOSIS — B029 Zoster without complications: Secondary | ICD-10-CM

## 2015-06-08 DIAGNOSIS — I6523 Occlusion and stenosis of bilateral carotid arteries: Secondary | ICD-10-CM

## 2015-06-08 NOTE — Progress Notes (Signed)
Patient ID: Kendra Todd, female   DOB: [birth year], 74 y.o.   MRN: [id number]    Facility  [facility]    Place of Service:   OFFICE    Allergies  Allergen Reactions  . Iron Hives  . Norvasc [Amlodipine Besylate] Nausea And Vomiting  . Promethazine Other (See Comments)    hallucinations  . Vicodin [Hydrocodone-Acetaminophen] Other (See Comments)    Caused arrythmia   . Vioxx [Rofecoxib] Nausea And Vomiting    Chief Complaint  Patient presents with  . Medical Management of Chronic Issues    Medical Management of Chronic Issues. Follow up Shingles.    HPI:  Herpes zoster: no pain and rash is resolved.    Medications: Patient's Medications  New Prescriptions   No medications on file  Previous Medications   ACETAMINOPHEN-CODEINE (TYLENOL #3) 300-30 MG PER TABLET    One every 6 hours as needed to control pain   ASPIRIN 81 MG TABLET    Take 81 mg by mouth daily.    CARVEDILOL (COREG) 25 MG TABLET    Take 1 tablet (25 mg total) by mouth 2 (two) times daily.   CRESTOR 40 MG TABLET    TAKE 1 TABLET DAILY TO LOWER CHOLESTEROL   LATANOPROST (XALATAN) 0.005 % OPHTHALMIC SOLUTION    Place 1 drop into both eyes at bedtime.   METFORMIN (GLUCOPHAGE) 1000 MG TABLET    Take 1,000 mg by mouth 2 (two) times daily with a meal.   NITROGLYCERIN (NITROSTAT) 0.4 MG SL TABLET    Place 0.4 mg under the tongue every 5 (five) minutes as needed for chest pain.   VALSARTAN-HYDROCHLOROTHIAZIDE (DIOVAN-HCT) 320-25 MG PER TABLET    TAKE 1 TABLET DAILY FOR BLOOD PRESSURE  Modified Medications   No medications on file  Discontinued Medications   VALACYCLOVIR (VALTREX) 1000 MG TABLET    One twice daily to treat shingles     Review of Systems  Constitutional: Negative.   HENT: Negative.   Eyes: Negative.   Respiratory: Negative.   Cardiovascular: Negative for chest pain, palpitations and leg swelling.  Endocrine: Negative for polydipsia, polyphagia and polyuria.       History diabetes  Genitourinary:  Positive for urgency and frequency. Negative for flank pain.  Musculoskeletal: Positive for back pain and arthralgias.  Skin:       Rash on the left chest wall resolved  Neurological: Negative.   Hematological: Negative.   Psychiatric/Behavioral: Negative.     Filed Vitals:   06/08/15 1213  BP: 118/78  Pulse: 94  Temp: 98.1 F (36.7 C)  TempSrc: Oral  Height: 5\' 3"  (1.6 m)  Weight: 145 lb (65.772 kg)   Body mass index is 25.69 kg/(m^2).  Physical Exam  Constitutional: She is oriented to person, place, and time. She appears well-developed and well-nourished. No distress.  HENT:  Head: Normocephalic.  Nose: Nose normal.  Upper and lower dentures present.  Eyes: Conjunctivae are normal. Pupils are equal, round, and reactive to light.  Wears prescription lenses. Right cataract.  Neck: Normal range of motion. Neck supple. No JVD present. No tracheal deviation present. No thyromegaly present.  Cardiovascular: Normal rate, regular rhythm and normal heart sounds.  Exam reveals no gallop and no friction rub.   No murmur heard. Diminished dorsalis pedis pulses bilaterally and diminished posterior tibial pulses bilaterally.  Pulmonary/Chest: Effort normal and breath sounds normal. No respiratory distress. She has no wheezes. She has no rales. She exhibits no tenderness.  Abdominal: Soft. Bowel  sounds are normal. She exhibits no distension and no mass. There is no tenderness.  Musculoskeletal: She exhibits tenderness. She exhibits no edema.  Pain in multiple joints  Lymphadenopathy:    She has no cervical adenopathy.  Neurological: She is alert and oriented to person, place, and time. She has normal reflexes. No cranial nerve deficit. Coordination normal.  Diminished sensation to vibration and monofilament testing  Skin: Skin is warm and dry. No rash noted. No erythema. No pallor.  Resolution of prior rash on the left chest wall.  Psychiatric: She has a normal mood and affect. Her  behavior is normal. Judgment normal.     Labs reviewed: Appointment on 05/06/2015  Component Date Value Ref Range Status  . Hgb A1c MFr Bld 05/06/2015 6.6* 4.8 - 5.6 % Final   Comment:          Pre-diabetes: 5.7 - 6.4          Diabetes: >6.4          Glycemic control for adults with diabetes: <7.0   . Est. average glucose Bld gHb Est-m* 05/06/2015 143   Final  . Cholesterol, Total 05/06/2015 184  100 - 199 mg/dL Final  . Triglycerides 05/06/2015 83  0 - 149 mg/dL Final  . HDL 05/06/2015 48  >39 mg/dL Final   Comment: According to ATP-III Guidelines, HDL-C >59 mg/dL is considered a negative risk factor for CHD.   [name] VLDL Cholesterol Cal 05/06/2015 17  5 - 40 mg/dL Final  . LDL Calculated 05/06/2015 119* 0 - 99 mg/dL Final  . Chol/HDL Ratio 05/06/2015 3.8  0.0 - 4.4 ratio units Final   Comment:                                   T. Chol/HDL Ratio                                             Men  Women                               1/2 Avg.Risk  3.4    3.3                                   Avg.Risk  5.0    4.4                                2X Avg.Risk  9.6    7.1                                3X Avg.Risk 23.4   11.0   . Glucose 05/06/2015 105* 65 - 99 mg/dL Final  . BUN 05/06/2015 21  8 - 27 mg/dL Final  . Creatinine, Ser 05/06/2015 1.39* 0.57 - 1.00 mg/dL Final  . GFR calc non Af Amer 05/06/2015 37* >59 mL/min/1.73 Final  . GFR calc Af Amer 05/06/2015 42* >59 mL/min/1.73 Final  . BUN/Creatinine Ratio 05/06/2015 15  11 - 26 Final  . Sodium 05/06/2015 136  134 - 144  mmol/L Final  . Potassium 05/06/2015 5.0  3.5 - 5.2 mmol/L Final  . Chloride 05/06/2015 97  97 - 108 mmol/L Final  . CO2 05/06/2015 25  18 - 29 mmol/L Final  . Calcium 05/06/2015 9.4  8.7 - 10.3 mg/dL Final  . Total Protein 05/06/2015 6.4  6.0 - 8.5 g/dL Final  . Albumin 05/06/2015 3.9  3.5 - 4.8 g/dL Final  . Globulin, Total 05/06/2015 2.5  1.5 - 4.5 g/dL Final  . Albumin/Globulin Ratio 05/06/2015 1.6  1.1 - 2.5  Final  . Bilirubin Total 05/06/2015 0.6  0.0 - 1.2 mg/dL Final  . Alkaline Phosphatase 05/06/2015 60  39 - 117 IU/L Final  . AST 05/06/2015 16  0 - 40 IU/L Final  . ALT 05/06/2015 10  0 - 32 IU/L Final     Assessment/Plan  1. Herpes zoster resolved

## 2015-06-23 DIAGNOSIS — M11261 Other chondrocalcinosis, right knee: Secondary | ICD-10-CM | POA: Diagnosis not present

## 2015-06-23 DIAGNOSIS — M25559 Pain in unspecified hip: Secondary | ICD-10-CM | POA: Diagnosis not present

## 2015-06-23 DIAGNOSIS — M160 Bilateral primary osteoarthritis of hip: Secondary | ICD-10-CM | POA: Diagnosis not present

## 2015-06-23 DIAGNOSIS — M859 Disorder of bone density and structure, unspecified: Secondary | ICD-10-CM | POA: Diagnosis not present

## 2015-06-23 DIAGNOSIS — M11262 Other chondrocalcinosis, left knee: Secondary | ICD-10-CM | POA: Diagnosis not present

## 2015-06-23 DIAGNOSIS — M25561 Pain in right knee: Secondary | ICD-10-CM | POA: Diagnosis not present

## 2015-06-23 DIAGNOSIS — M159 Polyosteoarthritis, unspecified: Secondary | ICD-10-CM | POA: Diagnosis not present

## 2015-06-30 DIAGNOSIS — M159 Polyosteoarthritis, unspecified: Secondary | ICD-10-CM | POA: Diagnosis not present

## 2015-06-30 DIAGNOSIS — M545 Low back pain: Secondary | ICD-10-CM | POA: Diagnosis not present

## 2015-06-30 DIAGNOSIS — M25561 Pain in right knee: Secondary | ICD-10-CM | POA: Diagnosis not present

## 2015-06-30 DIAGNOSIS — M859 Disorder of bone density and structure, unspecified: Secondary | ICD-10-CM | POA: Diagnosis not present

## 2015-07-15 ENCOUNTER — Other Ambulatory Visit: Payer: Self-pay | Admitting: *Deleted

## 2015-07-15 MED ORDER — METFORMIN HCL 1000 MG PO TABS: 1000.0000 mg | ORAL_TABLET | Freq: Two times a day (BID) | ORAL | Status: DC

## 2015-07-15 NOTE — Telephone Encounter (Signed)
Patient requested Rx to be faxed to [facility] 

## 2015-07-26 ENCOUNTER — Encounter: Payer: Self-pay | Admitting: Vascular Surgery

## 2015-07-27 ENCOUNTER — Other Ambulatory Visit: Payer: Self-pay | Admitting: *Deleted

## 2015-07-27 DIAGNOSIS — Z01812 Encounter for preprocedural laboratory examination: Secondary | ICD-10-CM

## 2015-07-27 DIAGNOSIS — H2513 Age-related nuclear cataract, bilateral: Secondary | ICD-10-CM | POA: Diagnosis not present

## 2015-07-27 DIAGNOSIS — H4011X1 Primary open-angle glaucoma, mild stage: Secondary | ICD-10-CM | POA: Diagnosis not present

## 2015-07-27 LAB — CREATININE, ISTAT: Creatinine, IStat: 1.5 mg/dL — ABNORMAL HIGH (ref 0.6–1.3)

## 2015-07-28 ENCOUNTER — Ambulatory Visit
Admission: RE | Admit: 2015-07-28 | Discharge: 2015-07-28 | Disposition: A | Discharge status: Home / Self Care | Payer: Medicare Other | Source: Ambulatory Visit | Attending: Vascular Surgery | Admitting: Vascular Surgery

## 2015-07-28 ENCOUNTER — Ambulatory Visit (INDEPENDENT_AMBULATORY_CARE_PROVIDER_SITE_OTHER): Payer: Medicare Other | Admitting: Vascular Surgery

## 2015-07-28 ENCOUNTER — Encounter: Payer: Self-pay | Admitting: Vascular Surgery

## 2015-07-28 VITALS — BP 199/78 | HR 70 | Temp 98.1°F | Resp 14 | Ht 62.5 in | Wt 149.0 lb

## 2015-07-28 DIAGNOSIS — I6523 Occlusion and stenosis of bilateral carotid arteries: Secondary | ICD-10-CM

## 2015-07-28 DIAGNOSIS — I716 Thoracoabdominal aortic aneurysm, without rupture, unspecified: Secondary | ICD-10-CM

## 2015-07-28 DIAGNOSIS — I714 Abdominal aortic aneurysm, without rupture: Secondary | ICD-10-CM | POA: Diagnosis not present

## 2015-07-28 MED ORDER — IOPAMIDOL (ISOVUE-370) INJECTION 76%
50.0000 mL | Freq: Once | INTRAVENOUS | Status: AC | PRN
  Administered 2015-07-28: 50 mL via INTRAVENOUS

## 2015-07-28 NOTE — Progress Notes (Signed)
[facility]  HISTORY AND PHYSICAL    History of Present Illness:  Patient is a 74 y.o. 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   His atherosclerotic risk factors remain afib, diabetes, elevated cholesterol, smoking, and coronary artery disease.  These are all currently stable and followed by her primary care physician.  She has had poor control of her blood pressure chronically with it being low sometimes in the mornings and then very high in the evenings..  She denies any new neurologic events including amaurosis, numbness, or weakness. Previously had a right carotid endarterectomy by Dr. Amedeo Plenty in 2001. She also had a previous left carotid endarterectomy and redo carotid endarterectomy in 2001. Initial carotid was done by Dr. Amedeo Plenty the redo with Dr. Donnetta Hutching.    She is sent for followup today for evaluation of a thoracoabdominal aneurysm.    Past Medical History    Diagnosis   Date    .   Arthritis       .   Atrial fibrillation   02/21/2011    .   Hypertension       .   Hyperlipidemia       .   Myocardial infarction       .   Diabetes mellitus   age 69    .   CAD (coronary artery disease)             prior coronary stenting    .   Stroke   2001    .   Peripheral vascular disease       .   Carotid artery disease   2001          s/p Bilateral CEA, after CVA    .   TR (tricuspid regurgitation), severe 12/02/12 for repair with CABG   12/03/2012    .   Pulmonary HTN   12/03/2012    .   Dyspnea   03/13/2006          Myoview - EF 76%; mild ischemia in apical lateral region, less severe than previous study in 2002    .   H/O endarterectomy   09/11/2012          patent L and R carotid sites, R 60-79% restenosed ICA; L <40% restenosed ICA    .   Stenosis of artery   06/03/2012          doppler- most distal aspect of abd aorta no aneurysmal dilation; bilateral ABIs - mild L arterial insufficiency, L CIA narrowing w/ 50-69% diameter reduction; L and  R SFA both w/ 0-49% diameter reductions    .   H/O endarterectomy   01/08/2012          doppler - R distal common carotid/proximal ICA stenosed 80-99%, L 0-39%    .   Decreased pedal pulses   06/04/2011          doppler - bilateral ABIs no evidence of insufficiency; L CIA slightly elevated velocities 20-30% diameter reduction;     .   Limb pain   05/02/2011          doppler - no evidence of thrombus of thrombophlebitis    .   Diabetes       .   H/O carotid endarterectomy   03/06/2012          CT angio -high grade stenosis of proximal R internal carotid w/ lg posterior ulceration or dissection flap;  near occlusive stenosis at origin of nondominantproximal L vertebral artery; <50% stenosis of proximal R vertebral and proximal L subclavian artery    .   Urinary tract infection, site not specified       .   Tobacco use disorder       .   Unspecified cataract       .   Myalgia and myositis, unspecified       .   Cervicalgia       .   Acute upper respiratory infections of unspecified site       .   Tobacco use disorder       .   Peripheral vascular disease, unspecified       .   Atrial fibrillation       .   Phlebitis and thrombophlebitis of superficial vessels of lower extremities       .   Edema       .   Long term (current) use of anticoagulants       .   Loss of weight       .   Abdominal pain, epigastric       .   Atrial fibrillation       .   Benign paroxysmal positional vertigo       .   Type II or unspecified type diabetes mellitus with peripheral circulatory disorders, uncontrolled(250.72)       .   Unspecified vitamin D deficiency       .   Other manifestations of vitamin A deficiency       .   Type II or unspecified type diabetes mellitus with peripheral circulatory disorders, not stated as uncontrolled(250.70)       .   Primary localized osteoarthrosis, hand       .   Abdominal pain, epigastric       .   Nontoxic uninodular goiter       .   Nonspecific abnormal results of liver  function study       .   Candidiasis of vulva and vagina       .   Routine gynecological examination       .   Unspecified disorder of iris and ciliary body       .   Spinal stenosis, unspecified region other than cervical       .   Other malaise and fatigue       .   Pain in joint, pelvic region and thigh       .   Cervicalgia       .   Other and unspecified hyperlipidemia       .   Edema       .   Coronary atherosclerosis of unspecified type of vessel, native or graft       .   Pain in limb       .   Osteoarthrosis, unspecified whether generalized or localized, unspecified site       .   Occlusion and stenosis of carotid artery without mention of cerebral infarction       .   Lumbago       .   Myalgia and myositis, unspecified       .   Renal artery stenosis           Past Surgical History    Procedure   Laterality   Date    .  Carotid endarterectomy      2001          Bilateral    .   Cholecystectomy          .   Kidney stone surgery                Removal    .   Back surgery      [phone]          three previous surgeries    .   Angioplasty      1984 and 1982    .   Abdominal hysterectomy      1975    .   Percutaneous coronary stent intervention (pci-s)      2002          2 BMS in ostial/Prox & mid RCA (mid 2.5 mm 20x  mm, & 2.75 mm x  8 mm ostial)    .   Coronary artery bypass graft      12/04/2012          Procedure: CORONARY ARTERY BYPASS GRAFTING (CABG);  Surgeon: Ivin Poot, MD;  Location: [place];  Service: Open Heart Surgery;  Laterality: N/A;    .   Intraoperative transesophageal echocardiogram      12/04/2012          Procedure: INTRAOPERATIVE TRANSESOPHAGEAL ECHOCARDIOGRAM;  Surgeon: Ivin Poot, MD;  Location: [facility];  Service: Open Heart Surgery;  Laterality: N/A;    .   Appendectomy      1954    .   Tee with cardioversion      01/23/2011          EF 93-26%; grade 2 diastolic dysfunction, elevated mean LA filling pressure, RV systolic pressure increased  consistent w/ mod pulmonary hypertension    .   Arch aortogram      03/30/2011          40% recurrent stenosis at origin of R proximal carotid patch, shelf like recurrent stenosis w/in midsection of patch that does not appear to be flow limiting; normal non-stenosed great vessel origins      Social History History    Substance Use Topics    .   Smoking status:   Former Smoker -- 0.30 packs/day for 57 years          Types:   Cigarettes          Quit date:   11/19/2012    .   Smokeless tobacco:   Never Used             Comment: pt states she is working on it but husband just passed    .   Alcohol Use:   0.6 oz/week          1 Glasses of wine per week      Family History Family History    Problem   Relation   Age of Onset    .   Other   Mother             CVA    .   Diabetes   Mother       .   Heart disease   Mother             Heart disease before age 44    .   Heart attack   Mother       .   Heart disease   Father  Heart Disease before age 27    .   Heart attack   Father       .   Heart disease   Sister             Amputation    .   Diabetes   Sister       .   Heart disease   Brother             Heart disease before age 11    .   Diabetes   Brother       .   Stroke   Brother       .   Stroke   Brother       .   Heart disease   Brother       .   Stroke   Brother       .   Heart disease   Brother       .   Heart disease   Sister       .   Heart disease   Sister       .   Diabetes   Other       .   Cancer   Other             breast    .   Heart disease   Other             cad    .   Other   Other             cva    .   Diabetes   Daughter       .   Cancer   Sister         Allergies    Allergies    Allergen   Reactions    .   Iron   Hives    .   Norvasc [Amlodipine Besylate]   Nausea And Vomiting    .   Promethazine   Other (See Comments)          hallucinations    .   Vicodin [Hydrocodone-Acetaminophen]   Other (See Comments)          Caused  arrythmia     .   Vioxx [Rofecoxib]   Nausea And Vomiting         Current Outpatient Prescriptions on File Prior to Visit  Medication Sig Dispense Refill  . acetaminophen-codeine (TYLENOL #3) 300-30 MG per tablet One every 6 hours as needed to control pain 120 tablet 5  . aspirin 81 MG tablet Take 81 mg by mouth daily.     . carvedilol (COREG) 25 MG tablet Take 1 tablet (25 mg total) by mouth 2 (two) times daily. 180 tablet 3  . CRESTOR 40 MG tablet TAKE 1 TABLET DAILY TO LOWER CHOLESTEROL 90 tablet 1  . latanoprost (XALATAN) 0.005 % ophthalmic solution Place 1 drop into both eyes at bedtime.    . metFORMIN (GLUCOPHAGE) 1000 MG tablet Take 1 tablet (1,000 mg total) by mouth 2 (two) times daily with a meal. 180 tablet 3  . nitroGLYCERIN (NITROSTAT) 0.4 MG SL tablet Place 0.4 mg under the tongue every 5 (five) minutes as needed for chest pain.    . valsartan-hydrochlorothiazide (DIOVAN-HCT) 320-25 MG per tablet TAKE 1 TABLET DAILY FOR BLOOD PRESSURE 90 tablet 1   No current facility-administered medications on file prior to visit.    ROS:  General:  No weight loss, Fever, chills  HEENT: No recent headaches, no nasal bleeding, no visual changes, no sore throat  Neurologic: No dizziness, blackouts, seizures. No recent symptoms of stroke or mini- stroke. No recent episodes of slurred speech, or temporary blindness.  Cardiac: + recent episodes of chest pain/pressure, no shortness of breath at rest.  No shortness of breath with exertion.  +history of atrial fibrillation or irregular heartbeat  Vascular: No history of rest pain in feet.  No history of claudication.  No history of non-healing ulcer, No history of DVT    Pulmonary: No home oxygen, no productive cough, no hemoptysis,  No asthma or wheezing  Musculoskeletal:  [ ]  Arthritis, [ ]  Low back pain,  [ ]  Joint pain  Hematologic:No history of hypercoagulable state.  No history of easy bleeding.  No history of  anemia  Gastrointestinal: No hematochezia or melena,  No gastroesophageal reflux, no trouble swallowing  Urinary: [ ]  chronic Kidney disease, [ ]  on HD - [ ]  MWF or [ ]  TTHS, [ ]  Burning with urination, [ ]  Frequent urination, [ ]  Difficulty urinating;    Skin: No rashes  Psychological: No history of anxiety,  No history of depression   Physical Examination    Filed Vitals:   07/28/15 1252 07/28/15 1255  BP: 194/65 199/78  Pulse: 70 70  Temp: 98.1 F (36.7 C)   TempSrc: Oral   Resp: 14   Height: 5' 2.5" (1.588 m)   Weight: 149 lb (67.586 kg)   SpO2: 98%     General:  Alert and oriented, no acute distress HEENT: Normal Neck: Bilateral soft carotid bruits Pulmonary: Clear to auscultation bilaterally Cardiac: Regular Rate and Rhythm without 2/6 diastolic murmur Abdomen: Soft, non-tender, non-distended, no mass Skin: No rash Extremity Pulses:  2+ radial, brachial, femoral, absent dorsalis pedis pulses bilaterally Musculoskeletal: No deformity or edema     Neurologic: Upper and lower extremity motor 5/5 and symmetric  DATA:  CT angiogram from today images are reviewed.  This is compared to a CT scan from 6 months ago.[name] Descending thoracic and upper abdominal aorta was 4 cm which is similar to 6 months ago. The abdominal component is 4 cm in diameter which is also unchanged.   ASSESSMENT: Type II thoracoabdominal aneurysm no significant change in the last 6 months. We would consider repair of this at 6 cm or greater.Patient also with previous carotid endarterectomy and redo carotid endarterectomy. Currently asymptomatic no symptoms of TIA amaurosis or stroke  PLAN:  #1 repeat carotid duplex one year  #2 followup CT Angio chest abdomen pelvis in 6 months.  #3 hypertension patient will continue to work on this with her primary care physician.  Ruta Hinds, MD [facility] Office: [phone] Pager: [phone]

## 2015-07-29 NOTE — Addendum Note (Signed)
Addended by: Thresa Ross C on: 07/29/2015 12:59 PM   Modules accepted: Orders

## 2015-08-01 ENCOUNTER — Other Ambulatory Visit: Payer: Self-pay | Admitting: Cardiovascular Disease

## 2015-08-01 NOTE — Telephone Encounter (Signed)
Rx request sent to pharmacy.  

## 2015-08-04 ENCOUNTER — Encounter: Payer: Self-pay | Admitting: Cardiovascular Disease

## 2015-08-04 ENCOUNTER — Ambulatory Visit (INDEPENDENT_AMBULATORY_CARE_PROVIDER_SITE_OTHER): Payer: Medicare Other | Admitting: Cardiovascular Disease

## 2015-08-04 VITALS — BP 150/98 | HR 67 | Resp 16 | Ht 62.0 in | Wt 147.0 lb

## 2015-08-04 DIAGNOSIS — I10 Essential (primary) hypertension: Secondary | ICD-10-CM | POA: Diagnosis not present

## 2015-08-04 DIAGNOSIS — I701 Atherosclerosis of renal artery: Secondary | ICD-10-CM

## 2015-08-04 DIAGNOSIS — E785 Hyperlipidemia, unspecified: Secondary | ICD-10-CM

## 2015-08-04 DIAGNOSIS — I480 Paroxysmal atrial fibrillation: Secondary | ICD-10-CM | POA: Diagnosis not present

## 2015-08-04 DIAGNOSIS — I2510 Atherosclerotic heart disease of native coronary artery without angina pectoris: Secondary | ICD-10-CM

## 2015-08-04 DIAGNOSIS — I714 Abdominal aortic aneurysm, without rupture, unspecified: Secondary | ICD-10-CM

## 2015-08-04 DIAGNOSIS — I739 Peripheral vascular disease, unspecified: Secondary | ICD-10-CM

## 2015-08-04 MED ORDER — CLONIDINE HCL 0.1 MG PO TABS: ORAL_TABLET | ORAL | Status: DC

## 2015-08-04 MED ORDER — VALSARTAN-HYDROCHLOROTHIAZIDE 320-25 MG PO TABS: 0.5000 | ORAL_TABLET | Freq: Two times a day (BID) | ORAL | Status: DC

## 2015-08-04 NOTE — Progress Notes (Signed)
Patient ID: Kendra Todd, female   DOB: 12-10-1940, 74 y.o.   MRN: [id number]     Cardiology Office Note   Date:  08/06/2015   ID:  ANTWONETTE Todd, DOB January 12, 1941, MRN [id number]  PCP:  Estill Dooms, MD  Cardiologist:   [name], MD   Chief Complaint  Patient presents with  . Annual Exam  . Headache  . Edema    FEET AND ANKLES      History of Present Illness: Kendra Todd is a 74 y.o. female who presents for CAD status post CABG, hypertensive heart disease, severe hypertension, thoracoabdominal aortic aneurysm, carotid artery disease, hyperlipidemia, Paroxysmal atrial fibrillation  She has not had recent CV complaints. She had shingles on the left chest.  She reports wide swings in BP. Sometimes as low as 88/50, other times 180/100s. She cannot identify a pattern.  Kendra Todd has extensive coronary and peripheral arterial disease. She underwent coronary bypass surgery January 2014, after having previously undergone stents to the right coronary artery in 2002 and several angioplasty procedures before that. She has preserved left ventricular systolic function. Despite echo evidence of diastolic dysfunction, congestive heart failure has not been a problem.  She has undergone bilateral carotid endarterectomies and has recurrent stenoses. Of her surgeries was complicated by a dissection flap and a stroke, from which she has recovered. She has a moderate size thoracoabdominal aortic aneurysm (recent reimaging with Dr. Oneida Alar shows a stable diameter of 4 cm).   She has previously undergone TEE guided cardioversion for paroxysmal atrial fibrillation (2012) Had postoperative atrial fibrillation after bypass surgery. Anticoagulants were stopped due to gastrointestinal bleeding that required transfusion. She takes aspirin. She was poorly tolerant to amiodarone (nausea, anorexia, 20 lb weight loss).  She has severe systemic hypertension and it has taken a long time to find a regimen that  controls it. He finally seemed to have settled on a combination of high-dose carvedilol, Clonidine, hydralazine, valsartan/hydrochlorothiazide. She has moderate bilateral renal artery stenosis at angiography January 2015, not felt to be severe enough to cause secondary hypertension.    Past Medical History  Diagnosis Date  . Arthritis   . Atrial fibrillation ([place]) 02/21/2011  . Hypertension   . Hyperlipidemia   . Myocardial infarction ([place])   . Diabetes mellitus age 71  . CAD (coronary artery disease)     prior coronary stenting  . Stroke ([place]) 2001  . Peripheral vascular disease ([place])   . Carotid artery disease ([place]) 2001    s/p Bilateral CEA, after CVA  . TR (tricuspid regurgitation), severe 12/02/12 for repair with CABG 12/03/2012  . Pulmonary HTN ([place]) 12/03/2012  . Dyspnea 03/13/2006    Myoview - EF 76%; mild ischemia in apical lateral region, less severe than previous study in 2002  . H/O endarterectomy 09/11/2012    patent L and R carotid sites, R 60-79% restenosed ICA; L <40% restenosed ICA  . Stenosis of artery ([place]) 06/03/2012    doppler- most distal aspect of abd aorta no aneurysmal dilation; bilateral ABIs - mild L arterial insufficiency, L CIA narrowing w/ 50-69% diameter reduction; L and R SFA both w/ 0-49% diameter reductions  . H/O endarterectomy 01/08/2012    doppler - R distal common carotid/proximal ICA stenosed 80-99%, L 0-39%  . Decreased pedal pulses 06/04/2011    doppler - bilateral ABIs no evidence of insufficiency; L CIA slightly elevated velocities 20-30% diameter reduction;   . Limb pain 05/02/2011    doppler - no evidence  of thrombus of thrombophlebitis  . Diabetes ([place])   . H/O carotid endarterectomy 03/06/2012    CT angio -high grade stenosis of proximal R internal carotid w/ lg posterior ulceration or dissection flap; near occlusive stenosis at origin of nondominantproximal L vertebral artery; <50% stenosis of proximal R vertebral and proximal L subclavian artery    . Urinary tract infection, site not specified   . Tobacco use disorder   . Unspecified cataract   . Myalgia and myositis, unspecified   . Cervicalgia   . Acute upper respiratory infections of unspecified site   . Tobacco use disorder   . Peripheral vascular disease, unspecified ([place])   . Atrial fibrillation ([place])   . Phlebitis and thrombophlebitis of superficial vessels of lower extremities   . Edema   . Long term (current) use of anticoagulants   . Loss of weight   . Abdominal pain, epigastric   . Atrial fibrillation ([place])   . Benign paroxysmal positional vertigo   . Type II or unspecified type diabetes mellitus with peripheral circulatory disorders, uncontrolled(250.72)   . Unspecified vitamin D deficiency   . Other manifestations of vitamin A deficiency   . Type II or unspecified type diabetes mellitus with peripheral circulatory disorders, not stated as uncontrolled(250.70)   . Primary localized osteoarthrosis, hand   . Abdominal pain, epigastric   . Nontoxic uninodular goiter   . Nonspecific abnormal results of liver function study   . Candidiasis of vulva and vagina   . Routine gynecological examination   . Unspecified disorder of iris and ciliary body   . Spinal stenosis, unspecified region other than cervical   . Other malaise and fatigue   . Pain in joint, pelvic region and thigh   . Cervicalgia   . Other and unspecified hyperlipidemia   . Edema   . Coronary atherosclerosis of unspecified type of vessel, native or graft   . Pain in limb   . Osteoarthrosis, unspecified whether generalized or localized, unspecified site   . Occlusion and stenosis of carotid artery without mention of cerebral infarction   . Lumbago   . Myalgia and myositis, unspecified   . Renal artery stenosis ([place])   . HTN (hypertension) 11/27/2012  . Superficial vein thrombosis 05/11/2015    Left forehead    Past Surgical History  Procedure Laterality Date  . Carotid endarterectomy  2001     Bilateral  . Kidney stone surgery      Removal  . Back surgery  [id number]    three previous surgeries  . Angioplasty  1984 and 1982  . Abdominal hysterectomy  1975  . Percutaneous coronary stent intervention (pci-s)  2002    2 BMS in ostial/Prox & mid RCA (mid 2.5 mm 20x  mm, & 2.75 mm x  8 mm ostial)  . Coronary artery bypass graft  12/04/2012    Procedure: CORONARY ARTERY BYPASS GRAFTING (CABG);  Surgeon: Ivin Poot, MD;  Location: [facility];  Service: Open Heart Surgery;  Laterality: N/A;  . Intraoperative transesophageal echocardiogram  12/04/2012    Procedure: INTRAOPERATIVE TRANSESOPHAGEAL ECHOCARDIOGRAM;  Surgeon: Ivin Poot, MD;  Location: [facility];  Service: Open Heart Surgery;  Laterality: N/A;  . Appendectomy  1954  . Tee with cardioversion  01/23/2011    EF 76-19%; grade 2 diastolic dysfunction, elevated mean LA filling pressure, RV systolic pressure increased consistent w/ mod pulmonary hypertension  . Arch aortogram  03/30/2011    40% recurrent stenosis at origin of R proximal  carotid patch, shelf like recurrent stenosis w/in midsection of patch that does not appear to be flow limiting; normal non-stenosed great vessel origins  . Left heart catheterization with coronary angiogram N/A 11/28/2012    Procedure: LEFT HEART CATHETERIZATION WITH CORONARY ANGIOGRAM;  Surgeon: Leonie Man, MD;  Location: [facility] CATH LAB;  Service: Cardiovascular;  Laterality: N/A;  . Right heart catheterization N/A 12/02/2012    Procedure: RIGHT HEART CATH;  Surgeon: Troy Sine, MD;  Location: [facility] CATH LAB;  Service: Cardiovascular;  Laterality: N/A;  . Renal angiogram N/A 11/30/2013    Procedure: RENAL ANGIOGRAM;  Surgeon: Lorretta Harp, MD;  Location: [facility] CATH LAB;  Service: Cardiovascular;  Laterality: N/A;     Current Outpatient Prescriptions  Medication Sig Dispense Refill  . acetaminophen-codeine (TYLENOL #3) 300-30 MG per tablet One every 6 hours as needed to control pain 120 tablet 5    . aspirin 81 MG tablet Take 81 mg by mouth daily.     . carvedilol (COREG) 25 MG tablet TAKE 1 TABLET TWICE A DAY 180 tablet 0  . CRESTOR 40 MG tablet TAKE 1 TABLET DAILY TO LOWER CHOLESTEROL 90 tablet 1  . latanoprost (XALATAN) 0.005 % ophthalmic solution Place 1 drop into both eyes at bedtime.    . metFORMIN (GLUCOPHAGE) 1000 MG tablet Take 1 tablet (1,000 mg total) by mouth 2 (two) times daily with a meal. 180 tablet 3  . nitroGLYCERIN (NITROSTAT) 0.4 MG SL tablet Place 0.4 mg under the tongue every 5 (five) minutes as needed for chest pain.    . valsartan-hydrochlorothiazide (DIOVAN-HCT) 320-25 MG tablet Take 0.5 tablets by mouth 2 (two) times daily. for blood pressure 90 tablet 1  . cloNIDine (CATAPRES) 0.1 MG tablet TAKE AS NEEDED IF YOUR SYSTOLIC NUMBER IS GREATER THAN 150 90 tablet 2   No current facility-administered medications for this visit.    Allergies:   Iron; Norvasc; Promethazine; Vicodin; and Vioxx    Social History:  The patient  reports that she quit smoking about 2 years ago. Her smoking use included Cigarettes. She has a 17.1 pack-year smoking history. She has never used smokeless tobacco. She reports that she drinks about 0.6 oz of alcohol per week. She reports that she does not use illicit drugs.   Family History:  The patient's family history includes Cancer in her other and sister; Diabetes in her brother, daughter, mother, other, and sister; Heart attack in her father and mother; Heart disease in her brother, brother, brother, father, mother, other, sister, sister, and sister; Other in her mother and other; Stroke in her brother, brother, and brother.    ROS:  Please see the history of present illness.    Otherwise, review of systems positive for none.   All other systems are reviewed and negative.    PHYSICAL EXAM: VS:  BP 150/98 mmHg  Pulse 67  Resp 16  Ht 5\' 2"  (1.575 m)  Wt 147 lb (66.679 kg)  BMI 26.88 kg/m2 , BMI Body mass index is 26.88  kg/(m^2).  General: Alert, oriented x3, no distress Head: no evidence of trauma, PERRL, EOMI, no exophtalmos or lid lag, no myxedema, no xanthelasma; normal ears, nose and oropharynx Neck: normal jugular venous pulsations and no hepatojugular reflux; brisk carotid pulses without delay and no carotid bruits Chest: clear to auscultation, no signs of consolidation by percussion or palpation, normal fremitus, symmetrical and full respiratory excursions Cardiovascular: normal position and quality of the apical impulse, regular rhythm, normal first  and second heart sounds, no murmurs or rubs, loud S4 gallop is heard Abdomen: no tenderness or distention, no masses by palpation, no abnormal pulsatility or arterial bruits, normal bowel sounds, no hepatosplenomegaly Extremities: no clubbing, cyanosis or edema; 2+ radial, ulnar and brachial pulses bilaterally; 2+ right femoral, posterior tibial and dorsalis pedis pulses; 2+ left femoral, posterior tibial and dorsalis pedis pulses; no subclavian or femoral bruits Neurological: grossly nonfocal Psych: euthymic mood, full affect   EKG:  EKG is ordered today. The ekg ordered today demonstrates Sinus rhythm with one 1.5" pause, probably a blocked PAC, poor R wave progression, no repol changes, QTC 464 ms   Recent Labs: 05/06/2015: ALT 10; BUN 21; Creatinine, Ser 1.39*; Potassium 5.0; Sodium 136    Lipid Panel    Component Value Date/Time   CHOL 184 05/06/2015 0828   CHOL 160 11/28/2012 0239   TRIG 83 05/06/2015 0828   HDL 48 05/06/2015 0828   HDL 49 11/28/2012 0239   CHOLHDL 3.8 05/06/2015 0828   CHOLHDL 3.3 11/28/2012 0239   VLDL 12 11/28/2012 0239   LDLCALC 119* 05/06/2015 0828   LDLCALC 99 11/28/2012 0239      Wt Readings from Last 3 Encounters:  08/04/15 147 lb (66.679 kg)  07/28/15 149 lb (67.586 kg)  06/08/15 145 lb (65.772 kg)   .   ASSESSMENT AND PLAN:  CAD (coronary artery disease): 1993 PTCA to circumflex. 2002 2 BMS stents to  ostial, S/P CABG x 3 12/04/12  Now almost 2 years following bypass surgery. She is free of angina and can perform physical activity without shortness of breath. Functional class I.  DM2 (diabetes mellitus, type 2)  She reports good glycemic control   HTN (hypertension)  Her blood pressure is erratic. It is possible that her valsartan is wearing off in under 24h. Will give in two divided doses. Add clonidine prn. Bring her back to see our Pharm D with log of BP and her home monitor.  HLD (hyperlipidemia)  Ideally, her LDL should be less than 70, but the current LDL level is substantially improved over baseline (greater than 50% reduction). Option to add zetia?  Recurrent carotid stenosis  She has recently seen Dr. Oneida Alar and there is restenosis in the right carotid to approximately 80% severity. She has undergone previous bilateral carotid endarterectomy. Discussed the fact that a stent in the superior option to repeat surgery.   Thoracoabdominal aortic aneurysm Stable in size, followed by Dr. Ruta Hinds.  Atrial fibrillation  Recently it has not recurred. She is not on anticoagulation secondary to severe GI bleeding while on Xarelto. She is not taking any antiarrhythmics. She tolerated amiodarone poorly. Her previous neurological event was related to carotid surgery, not embolic phenomena.  PAD (peripheral artery disease)  Her most recent Doppler study in July of 2013 showed a 50-69% stenosis in the left common iliac artery there was three-vessel runoff in the calf on the right side there was two-vessel runoff to 2 occlusion of the anterior tibial artery with distal reconstitution; bilaterally the ABIs were normal; she does not describe claudication today.    Current medicines are reviewed at length with the patient today.  The patient has concerns regarding medicines.   Labs/ tests ordered today include:   Orders Placed This Encounter  Procedures  . EKG 12-Lead      Patient Instructions  Your physician has recommended you make the following change in your medication: TAKE VALSARTAN HCTZ 1/2 TABLET TWICE A DAY. START CLONIDINE  0.1MG  - TAKE AS NEEDED DAILY IF YOUR SYSTOLIC BP IS GREATER THAN 150.  Your physician recommends that you schedule a follow-up appointment in: 2-3 [place], PHARM-D FOR BLOOD PRESSURE CHECK.  Dr. [name] recommends that you schedule a follow-up appointment in: [place], CROITORU,MIHAI, MD  08/06/2015 4:46 PM    [name], MD, [facility] [phone] office [phone] pager

## 2015-08-04 NOTE — Patient Instructions (Signed)
Your physician has recommended you make the following change in your medication: TAKE VALSARTAN HCTZ 1/2 TABLET TWICE A DAY. START CLONIDINE 0.1MG  - TAKE AS NEEDED DAILY IF YOUR SYSTOLIC BP IS GREATER THAN 150.  Your physician recommends that you schedule a follow-up appointment in: 2-3 [place], PHARM-D FOR BLOOD PRESSURE CHECK.  Dr. [name] recommends that you schedule a follow-up appointment in: 4 MONTHS

## 2015-08-06 ENCOUNTER — Encounter: Payer: Self-pay | Admitting: Cardiovascular Disease

## 2015-08-25 ENCOUNTER — Encounter: Payer: Self-pay | Admitting: Pharmacist Clinician (PhC)/ Clinical Pharmacy Specialist

## 2015-08-25 ENCOUNTER — Ambulatory Visit (INDEPENDENT_AMBULATORY_CARE_PROVIDER_SITE_OTHER): Payer: Medicare Other | Admitting: Pharmacist Clinician (PhC)/ Clinical Pharmacy Specialist

## 2015-08-25 VITALS — BP 148/92 | HR 56 | Wt 146.0 lb

## 2015-08-25 DIAGNOSIS — I701 Atherosclerosis of renal artery: Secondary | ICD-10-CM

## 2015-08-25 DIAGNOSIS — I10 Essential (primary) hypertension: Secondary | ICD-10-CM

## 2015-08-25 NOTE — Progress Notes (Signed)
08/25/2015 ITHA KROEKER September 20, 1941 [id number]   HPI:  Kendra Todd is a 74 y.o. female patient of Dr [name], with a PMH below who presents today for hypertension clinic evaluation.  Her blood pressure is quite labile, with as much as a 100 point swing in systolic readings over the course of a week.    Cardiac Hx: HTN, CAD with CABG, carotid artery disease, thoracoabdominal aortic aneurysm, PAF, stroke, hyperlipidemia  Family Hx: one of 9 siblings, both parents and 7 siblings all deceased from MI, mothers family had DM; 3 children one starting to develop cardiac problems (cardiomyopathy?)  Social Hx: coffee each morning, rare soda  Diet: believes to be low salt diet, does add some when cooking; drinks 16-20 oz water per day, eats out usually once weekly.    Exercise: none, problems with arthritis in hips and knees  Home BP readings: (pt has AF) has home wrist cuff with wide range of readings, low of 92/67 and high of 161/110.  Average 124/83.  She brings her cuff with her today.   Current antihypertensive medications: valsartan 320/25 (divided to 1/2 tablet twice daily); clonidine 0.1 mg for systolic BP > 741 (has take 2 x since MD visit on 9/29)   Current Outpatient Prescriptions  Medication Sig Dispense Refill  . acetaminophen-codeine (TYLENOL #3) 300-30 MG per tablet One every 6 hours as needed to control pain 120 tablet 5  . aspirin 81 MG tablet Take 81 mg by mouth daily.     . carvedilol (COREG) 25 MG tablet TAKE 1 TABLET TWICE A DAY 180 tablet 0  . cloNIDine (CATAPRES) 0.1 MG tablet TAKE AS NEEDED IF YOUR SYSTOLIC NUMBER IS GREATER THAN 150 90 tablet 2  . CRESTOR 40 MG tablet TAKE 1 TABLET DAILY TO LOWER CHOLESTEROL 90 tablet 1  . latanoprost (XALATAN) 0.005 % ophthalmic solution Place 1 drop into both eyes at bedtime.    . metFORMIN (GLUCOPHAGE) 1000 MG tablet Take 1 tablet (1,000 mg total) by mouth 2 (two) times daily with a meal. 180 tablet 3  . nitroGLYCERIN (NITROSTAT)  0.4 MG SL tablet Place 0.4 mg under the tongue every 5 (five) minutes as needed for chest pain.    . valsartan-hydrochlorothiazide (DIOVAN-HCT) 320-25 MG tablet Take 0.5 tablets by mouth 2 (two) times daily. for blood pressure 90 tablet 1   No current facility-administered medications for this visit.    Allergies  Allergen Reactions  . Iron Hives  . Norvasc [Amlodipine Besylate] Nausea And Vomiting  . Promethazine Other (See Comments)    hallucinations  . Vicodin [Hydrocodone-Acetaminophen] Other (See Comments)    Caused arrythmia   . Vioxx [Rofecoxib] Nausea And Vomiting    Past Medical History  Diagnosis Date  . Arthritis   . Atrial fibrillation ([place]) 02/21/2011  . Hypertension   . Hyperlipidemia   . Myocardial infarction ([place])   . Diabetes mellitus age 8  . CAD (coronary artery disease)     prior coronary stenting  . Stroke ([place]) 2001  . Peripheral vascular disease ([place])   . Carotid artery disease ([place]) 2001    s/p Bilateral CEA, after CVA  . TR (tricuspid regurgitation), severe 12/02/12 for repair with CABG 12/03/2012  . Pulmonary HTN ([place]) 12/03/2012  . Dyspnea 03/13/2006    Myoview - EF 76%; mild ischemia in apical lateral region, less severe than previous study in 2002  . H/O endarterectomy 09/11/2012    patent L and R carotid sites, R 60-79%  restenosed ICA; L <40% restenosed ICA  . Stenosis of artery ([place]) 06/03/2012    doppler- most distal aspect of abd aorta no aneurysmal dilation; bilateral ABIs - mild L arterial insufficiency, L CIA narrowing w/ 50-69% diameter reduction; L and R SFA both w/ 0-49% diameter reductions  . H/O endarterectomy 01/08/2012    doppler - R distal common carotid/proximal ICA stenosed 80-99%, L 0-39%  . Decreased pedal pulses 06/04/2011    doppler - bilateral ABIs no evidence of insufficiency; L CIA slightly elevated velocities 20-30% diameter reduction;   . Limb pain 05/02/2011    doppler - no evidence of thrombus of thrombophlebitis  . Diabetes  ([place])   . H/O carotid endarterectomy 03/06/2012    CT angio -high grade stenosis of proximal R internal carotid w/ lg posterior ulceration or dissection flap; near occlusive stenosis at origin of nondominantproximal L vertebral artery; <50% stenosis of proximal R vertebral and proximal L subclavian artery  . Urinary tract infection, site not specified   . Tobacco use disorder   . Unspecified cataract   . Myalgia and myositis, unspecified   . Cervicalgia   . Acute upper respiratory infections of unspecified site   . Tobacco use disorder   . Peripheral vascular disease, unspecified ([place])   . Atrial fibrillation ([place])   . Phlebitis and thrombophlebitis of superficial vessels of lower extremities   . Edema   . Long term (current) use of anticoagulants   . Loss of weight   . Abdominal pain, epigastric   . Atrial fibrillation ([place])   . Benign paroxysmal positional vertigo   . Type II or unspecified type diabetes mellitus with peripheral circulatory disorders, uncontrolled(250.72)   . Unspecified vitamin D deficiency   . Other manifestations of vitamin A deficiency   . Type II or unspecified type diabetes mellitus with peripheral circulatory disorders, not stated as uncontrolled(250.70)   . Primary localized osteoarthrosis, hand   . Abdominal pain, epigastric   . Nontoxic uninodular goiter   . Nonspecific abnormal results of liver function study   . Candidiasis of vulva and vagina   . Routine gynecological examination   . Unspecified disorder of iris and ciliary body   . Spinal stenosis, unspecified region other than cervical   . Other malaise and fatigue   . Pain in joint, pelvic region and thigh   . Cervicalgia   . Other and unspecified hyperlipidemia   . Edema   . Coronary atherosclerosis of unspecified type of vessel, native or graft   . Pain in limb   . Osteoarthrosis, unspecified whether generalized or localized, unspecified site   . Occlusion and stenosis of carotid artery  without mention of cerebral infarction   . Lumbago   . Myalgia and myositis, unspecified   . Renal artery stenosis ([place])   . HTN (hypertension) 11/27/2012  . Superficial vein thrombosis 05/11/2015    Left forehead    Blood pressure 148/92, pulse 56, weight 146 lb (66.225 kg).    Tommy Medal PharmD CPP [facility]

## 2015-08-25 NOTE — Patient Instructions (Signed)
Return for a a follow up appointment in 3 months (we will call you in December to schedule that for January)  Your blood pressure today is 148/92  Check your blood pressure at home several times each week and keep record of the readings. If you take a clonidine tablet please check your BP about 3-4 hours later  Take your BP meds as follows: continue valsartan 320/25 mg (1/2 tablet twice daily), clonidine 0.1 mg if systolic pressure is >119  Bring all of your meds, your BP cuff and your record of home blood pressures to your next appointment.  Exercise as you're able, try to walk approximately 30 minutes per day.  Keep salt intake to a minimum, especially watch canned and prepared boxed foods.  Eat more fresh fruits and vegetables and fewer canned items.  Avoid eating in fast food restaurants.    HOW TO TAKE YOUR BLOOD PRESSURE: . Rest 5 minutes before taking your blood pressure. .  Don't smoke or drink caffeinated beverages for at least 30 minutes before. . Take your blood pressure before (not after) you eat. . Sit comfortably with your back supported and both feet on the floor (don't cross your legs). . Elevate your arm to heart level on a table or a desk. . Use the proper sized cuff. It should fit smoothly and snugly around your bare upper arm. There should be enough room to slip a fingertip under the cuff. The bottom edge of the cuff should be 1 inch above the crease of the elbow. . Ideally, take 3 measurements at one sitting and record the average.

## 2015-08-25 NOTE — Assessment & Plan Note (Addendum)
Today in the office her BP is at 148/92.  With her extensive cardiac history would like to keep her pressure at or below 140/90.  She brought her home cuff with her and unfortunately it was erratic, reading 10-25 points higher (tried twice) systolic and 47-09 points higher diastolic.  That being said, most of her home readings were lower than what we got in the office today.  She is currently taking her valsartan hctz 320/25 1/2 tablet twice daily, in hopes of avoiding hypotension.   She also has clonidine 0.1 mg to take only as needed for systolic readings >295.  I am going to continue her on the same routine, and have asked that she check her BP daily and also about 3-4 hours after taking any doses of clonidine, to see how effective it is.  I will have her follow up with me in 3 months, but she is to call sooner if she finds herself using the clonidine multiple times per week.

## 2015-09-13 ENCOUNTER — Ambulatory Visit: Payer: Medicare Other | Admitting: Internal Medicine

## 2015-09-26 DIAGNOSIS — M810 Age-related osteoporosis without current pathological fracture: Secondary | ICD-10-CM | POA: Diagnosis not present

## 2015-09-27 ENCOUNTER — Other Ambulatory Visit: Payer: Medicare Other

## 2015-09-27 DIAGNOSIS — E1122 Type 2 diabetes mellitus with diabetic chronic kidney disease: Secondary | ICD-10-CM | POA: Diagnosis not present

## 2015-09-27 DIAGNOSIS — E785 Hyperlipidemia, unspecified: Secondary | ICD-10-CM

## 2015-09-28 LAB — BASIC METABOLIC PANEL
BUN / CREAT RATIO: 25 (ref 11–26)
BUN: 31 mg/dL — ABNORMAL HIGH (ref 8–27)
CO2: 23 mmol/L (ref 18–29)
CREATININE: 1.23 mg/dL — AB (ref 0.57–1.00)
Calcium: 9.2 mg/dL (ref 8.7–10.3)
Chloride: 104 mmol/L (ref 97–106)
GFR, EST AFRICAN AMERICAN: 49 mL/min/{1.73_m2} — AB (ref >59)
GFR, EST NON AFRICAN AMERICAN: 43 mL/min/{1.73_m2} — AB (ref >59)
Glucose: 112 mg/dL — ABNORMAL HIGH (ref 65–99)
POTASSIUM: 4.7 mmol/L (ref 3.5–5.2)
SODIUM: 139 mmol/L (ref 136–144)

## 2015-09-28 LAB — LIPID PANEL
CHOL/HDL RATIO: 3 ratio (ref 0.0–4.4)
Cholesterol, Total: 170 mg/dL (ref 100–199)
HDL: 57 mg/dL (ref >39)
LDL Calculated: 102 mg/dL — ABNORMAL HIGH (ref 0–99)
TRIGLYCERIDES: 55 mg/dL (ref 0–149)
VLDL Cholesterol Cal: 11 mg/dL (ref 5–40)

## 2015-09-28 LAB — HEMOGLOBIN A1C
Est. average glucose Bld gHb Est-mCnc: 146 mg/dL
HEMOGLOBIN A1C: 6.7 % — AB (ref 4.8–5.6)

## 2015-10-05 ENCOUNTER — Ambulatory Visit (INDEPENDENT_AMBULATORY_CARE_PROVIDER_SITE_OTHER): Payer: Medicare Other | Admitting: Internal Medicine

## 2015-10-05 ENCOUNTER — Encounter: Payer: Self-pay | Admitting: Internal Medicine

## 2015-10-05 VITALS — BP 138/78 | HR 67 | Temp 98.0°F | Resp 20 | Ht 62.0 in | Wt 150.6 lb

## 2015-10-05 DIAGNOSIS — I10 Essential (primary) hypertension: Secondary | ICD-10-CM | POA: Diagnosis not present

## 2015-10-05 DIAGNOSIS — I2510 Atherosclerotic heart disease of native coronary artery without angina pectoris: Secondary | ICD-10-CM

## 2015-10-05 DIAGNOSIS — N182 Chronic kidney disease, stage 2 (mild): Secondary | ICD-10-CM

## 2015-10-05 DIAGNOSIS — M159 Polyosteoarthritis, unspecified: Secondary | ICD-10-CM

## 2015-10-05 DIAGNOSIS — E1122 Type 2 diabetes mellitus with diabetic chronic kidney disease: Secondary | ICD-10-CM

## 2015-10-05 DIAGNOSIS — E785 Hyperlipidemia, unspecified: Secondary | ICD-10-CM

## 2015-10-05 DIAGNOSIS — M150 Primary generalized (osteo)arthritis: Secondary | ICD-10-CM | POA: Diagnosis not present

## 2015-10-05 DIAGNOSIS — I701 Atherosclerosis of renal artery: Secondary | ICD-10-CM | POA: Diagnosis not present

## 2015-10-05 MED ORDER — MELOXICAM 15 MG PO TABS: ORAL_TABLET | ORAL | Status: DC

## 2015-10-05 NOTE — Progress Notes (Signed)
Patient ID: Kendra Todd, female   DOB: Nov 06, 1940, 74 y.o.   MRN: [id number]    Facility  [facility]    Place of Service:   OFFICE    Allergies  Allergen Reactions  . Iron Hives  . Norvasc [Amlodipine Besylate] Nausea And Vomiting  . Promethazine Other (See Comments)    hallucinations  . Vicodin [Hydrocodone-Acetaminophen] Other (See Comments)    Caused arrythmia   . Vioxx [Rofecoxib] Nausea And Vomiting    Chief Complaint  Patient presents with  . Medical Management of Chronic Issues    4 mo f/u    HPI:  Coronary artery disease involving native coronary artery of native heart without angina pectoris - occasional heaviness in the chest, but no use of nitroglycerin. Denies dyspnea or increased fatigability.  Type 2 diabetes mellitus with stage 2 chronic kidney disease, without long-term current use of insulin (HCC) - controlled  - HLD (hyperlipidemia) - controlled  Essential hypertension - controlled  Primary osteoarthritis involving multiple joints - multiple painful joints. Now seeing Dr..Syed at [facility]. Had recent bone scan. Results have not been reported yet.    Medications: Patient's Medications  New Prescriptions   No medications on file  Previous Medications   ACETAMINOPHEN-CODEINE (TYLENOL #3) 300-30 MG PER TABLET    One every 6 hours as needed to control pain   ASPIRIN 81 MG TABLET    Take 81 mg by mouth daily.    CARVEDILOL (COREG) 25 MG TABLET    TAKE 1 TABLET TWICE A DAY   CLONIDINE (CATAPRES) 0.1 MG TABLET    TAKE AS NEEDED IF YOUR SYSTOLIC NUMBER IS GREATER THAN 150   CRESTOR 40 MG TABLET    TAKE 1 TABLET DAILY TO LOWER CHOLESTEROL   LATANOPROST (XALATAN) 0.005 % OPHTHALMIC SOLUTION    Place 1 drop into both eyes at bedtime.   METFORMIN (GLUCOPHAGE) 1000 MG TABLET    Take 1 tablet (1,000 mg total) by mouth 2 (two) times daily with a meal.   NITROGLYCERIN (NITROSTAT) 0.4 MG SL TABLET    Place 0.4 mg under the tongue every 5 (five) minutes  as needed for chest pain.   VALSARTAN-HYDROCHLOROTHIAZIDE (DIOVAN-HCT) 320-25 MG TABLET    Take 0.5 tablets by mouth 2 (two) times daily. for blood pressure  Modified Medications   No medications on file  Discontinued Medications   No medications on file    Review of Systems  Constitutional: Negative.   HENT: Negative.   Eyes: Negative.   Respiratory: Negative.   Cardiovascular: Negative for chest pain, palpitations and leg swelling.  Endocrine: Negative for polydipsia, polyphagia and polyuria.       History diabetes  Genitourinary: Positive for urgency and frequency. Negative for flank pain.  Musculoskeletal: Positive for back pain and arthralgias.  Skin:       Rash on the left chest wall resolved  Neurological: Negative.   Hematological: Negative.   Psychiatric/Behavioral: Negative.     Filed Vitals:   10/05/15 1417  BP: 138/78  Pulse: 67  Temp: 98 F (36.7 C)  TempSrc: Oral  Resp: 20  Height: 5' 2" (1.575 m)  Weight: 150 lb 9.6 oz (68.312 kg)  SpO2: 97%   Body mass index is 27.54 kg/(m^2).  Physical Exam  Constitutional: She is oriented to person, place, and time. She appears well-developed and well-nourished. No distress.  HENT:  Head: Normocephalic.  Nose: Nose normal.  Upper and lower dentures present.  Eyes: Conjunctivae are normal. Pupils  are equal, round, and reactive to light.  Wears prescription lenses. Right cataract.  Neck: Normal range of motion. Neck supple. No JVD present. No tracheal deviation present. No thyromegaly present.  Cardiovascular: Normal rate, regular rhythm and normal heart sounds.  Exam reveals no gallop and no friction rub.   No murmur heard. Diminished dorsalis pedis pulses bilaterally and diminished posterior tibial pulses bilaterally.  Pulmonary/Chest: Effort normal and breath sounds normal. No respiratory distress. She has no wheezes. She has no rales. She exhibits no tenderness.  Abdominal: Soft. Bowel sounds are normal. She  exhibits no distension and no mass. There is no tenderness.  Musculoskeletal: She exhibits tenderness. She exhibits no edema.  Pain in multiple joints  Lymphadenopathy:    She has no cervical adenopathy.  Neurological: She is alert and oriented to person, place, and time. She has normal reflexes. No cranial nerve deficit. Coordination normal.  Diminished sensation to vibration and monofilament testing  Skin: Skin is warm and dry. No rash noted. No erythema. No pallor.  Resolution of prior rash on the left chest wall.  Psychiatric: She has a normal mood and affect. Her behavior is normal. Judgment normal.    Labs reviewed: Lab Summary Latest Ref Rng 09/27/2015 05/06/2015 01/07/2015  Hemoglobin 13.0-17.0 g/dL (None) (None) (None)  Hematocrit 39.0-52.0 % (None) (None) (None)  White count - (None) (None) (None)  Platelet count - (None) (None) (None)  Sodium 136 - 144 mmol/L 139 136 135  Potassium 3.5 - 5.2 mmol/L 4.7 5.0 5.1  Calcium 8.7 - 10.3 mg/dL 9.2 9.4 9.0  Phosphorus - (None) (None) (None)  Creatinine 0.57 - 1.00 mg/dL 1.23(H) 1.39(H) 1.61(H)  AST 0 - 40 IU/L (None) 16 17  Alk Phos 39 - 117 IU/L (None) 60 63  Bilirubin 0.0 - 1.2 mg/dL (None) 0.6 0.4  Glucose 65 - 99 mg/dL 112(H) 105(H) 110(H)  Cholesterol - (None) (None) (None)  HDL cholesterol >39 mg/dL 57 48 50  Triglycerides 0 - 149 mg/dL 55 83 65  LDL Direct - (None) (None) (None)  LDL Calc 0 - 99 mg/dL 102(H) 119(H) 116(H)  Total protein - (None) (None) (None)  Albumin 3.5 - 4.8 g/dL (None) 3.9 4.1   Lab Results  Component Value Date   TSH 1.436 11/24/2013   Lab Results  Component Value Date   BUN 31* 09/27/2015   Lab Results  Component Value Date   HGBA1C 6.7* 09/27/2015    Assessment/Plan  1. Coronary artery disease involving native coronary artery of native heart without angina pectoris Stable  2. Type 2 diabetes mellitus with stage 2 chronic kidney disease, without long-term current use of insulin  (HCC) Controlled - Hemoglobin A1c; Future - Comprehensive metabolic panel; Future - Microalbumin, urine; Future  3. HLD (hyperlipidemia) Controlled - Lipid panel; Future  4. Essential hypertension Controlled - Comprehensive metabolic panel; Future  5. Primary osteoarthritis involving multiple joints -Try meloxicam 15 mg daily

## 2015-10-28 ENCOUNTER — Other Ambulatory Visit: Payer: Self-pay | Admitting: Cardiovascular Disease

## 2015-10-28 NOTE — Telephone Encounter (Signed)
Rx request sent to pharmacy.  

## 2015-11-03 ENCOUNTER — Encounter: Payer: Self-pay | Admitting: Physician Assistant

## 2015-11-03 ENCOUNTER — Ambulatory Visit (INDEPENDENT_AMBULATORY_CARE_PROVIDER_SITE_OTHER): Payer: Medicare Other | Admitting: Physician Assistant

## 2015-11-03 VITALS — BP 160/90 | HR 58 | Ht 62.5 in | Wt 155.6 lb

## 2015-11-03 DIAGNOSIS — I071 Rheumatic tricuspid insufficiency: Secondary | ICD-10-CM | POA: Diagnosis not present

## 2015-11-03 DIAGNOSIS — I5030 Unspecified diastolic (congestive) heart failure: Secondary | ICD-10-CM | POA: Diagnosis not present

## 2015-11-03 DIAGNOSIS — I701 Atherosclerosis of renal artery: Secondary | ICD-10-CM

## 2015-11-03 DIAGNOSIS — I4891 Unspecified atrial fibrillation: Secondary | ICD-10-CM | POA: Diagnosis not present

## 2015-11-03 DIAGNOSIS — Z79899 Other long term (current) drug therapy: Secondary | ICD-10-CM | POA: Diagnosis not present

## 2015-11-03 LAB — BASIC METABOLIC PANEL
BUN: 35 mg/dL — AB (ref 7–25)
CALCIUM: 8.9 mg/dL (ref 8.6–10.4)
CO2: 23 mmol/L (ref 20–31)
Chloride: 103 mmol/L (ref 98–110)
Creat: 1.43 mg/dL — ABNORMAL HIGH (ref 0.60–0.93)
GLUCOSE: 114 mg/dL — AB (ref 65–99)
POTASSIUM: 5.1 mmol/L (ref 3.5–5.3)
SODIUM: 137 mmol/L (ref 135–146)

## 2015-11-03 MED ORDER — FUROSEMIDE 40 MG PO TABS: 40.0000 mg | ORAL_TABLET | Freq: Every day | ORAL | Status: DC

## 2015-11-03 MED ORDER — MELOXICAM 7.5 MG PO TABS
ORAL_TABLET | ORAL | Status: DC
Start: 2015-11-03 — End: 2015-11-18

## 2015-11-03 MED ORDER — POTASSIUM CHLORIDE CRYS ER 20 MEQ PO TBCR: 20.0000 meq | EXTENDED_RELEASE_TABLET | Freq: Every day | ORAL | Status: DC

## 2015-11-03 NOTE — Patient Instructions (Addendum)
Medication Instructions:  Your physician has recommended you make the following change in your medication:  1- START Lasix 40 mg by mouth daily for 4 days 2- START Kdur 20 meq by mouth daily for 4 days 3- HOLD Diavan/HCTZ 4- Decrease Mobic 7.5 mg by mouth daily as needed    Labwork: Your physician recommends that you have lab work today: BMET Your physician recommends that you return for lab work in: one week with your echocardiogram. BMET  Testing/Procedures: Your physician has requested that you have an echocardiogram in one week. Echocardiography is a painless test that uses sound waves to create images of your heart. It provides your doctor with information about the size and shape of your heart and how well your heart's chambers and valves are working. This procedure takes approximately one hour. There are no restrictions for this procedure.  Follow-Up: Your physician recommends that you schedule a follow-up appointment as directed after echocardiogram results.      If you need a refill on your cardiac medications before your next appointment, please call your pharmacy.

## 2015-11-03 NOTE — Progress Notes (Signed)
Cardiology Office Note   Date:  11/03/2015   ID:  Kendra Todd, DOB 1941-03-05, MRN [id number]  PCP:  Estill Dooms, MD  Cardiologist:  Dr Juanetta Snow, PA-C   Chief Complaint  Patient presents with  . Follow-up    pt c/o occasional chest tightness, swelling/weakness in bilateral legs, SOB on minimal exertion    History of Present Illness: Kendra Todd is a 74 y.o. female with a history of CABG 2014, HTN, hypertensive heart dz, thoraco-abd AA, carotid dz, HL, PAF not anticoagulated due to [place] presents for evaluation of dyspnea and edema as well as chest tightness.  She started noticing increased dyspnea on exertion and this is progressed. She now gets short of breath walking room to room. She describes orthopnea but PND is not clear as she normally would gets up several times a night to go to the bathroom  She has had some chest tightness but that is occurring at rest and with exertion. It is not necessarily in association with the shortness of breath. She also notices that her legs are weak and feels that she gets very tired whenever she walks any significant distance. Her activity level has greatly decreased because of her symptoms. She is aware that her weight is up a little bit but does not have scales and so doesn't know how much. Her abdomen feels bloated and she has lower extremity edema.   She has a bad back which gives her back pain. She also has hip pain and right knee pain.   Past Medical History  Diagnosis Date  . Arthritis   . Atrial fibrillation ([place]) 02/21/2011  . Hypertension   . Hyperlipidemia   . Myocardial infarction ([employer])   . Diabetes mellitus age 82  . CAD (coronary artery disease)     prior coronary stenting  . Stroke ([place]) 2001  . Peripheral vascular disease ([place])   . Carotid artery disease ([place]) 2001    s/p Bilateral CEA, after CVA  . TR (tricuspid regurgitation), severe 12/02/12 for repair with CABG 12/03/2012  .  Pulmonary HTN ([place]) 12/03/2012  . Dyspnea 03/13/2006    Myoview - EF 76%; mild ischemia in apical lateral region, less severe than previous study in 2002  . H/O endarterectomy 09/11/2012    patent L and R carotid sites, R 60-79% restenosed ICA; L <40% restenosed ICA  . Stenosis of artery ([place]) 06/03/2012    doppler- most distal aspect of abd aorta no aneurysmal dilation; bilateral ABIs - mild L arterial insufficiency, L CIA narrowing w/ 50-69% diameter reduction; L and R SFA both w/ 0-49% diameter reductions  . H/O endarterectomy 01/08/2012    doppler - R distal common carotid/proximal ICA stenosed 80-99%, L 0-39%  . Decreased pedal pulses 06/04/2011    doppler - bilateral ABIs no evidence of insufficiency; L CIA slightly elevated velocities 20-30% diameter reduction;   . Limb pain 05/02/2011    doppler - no evidence of thrombus of thrombophlebitis  . Diabetes ([place])   . H/O carotid endarterectomy 03/06/2012    CT angio -high grade stenosis of proximal R internal carotid w/ lg posterior ulceration or dissection flap; near occlusive stenosis at origin of nondominantproximal L vertebral artery; <50% stenosis of proximal R vertebral and proximal L subclavian artery  . Urinary tract infection, site not specified   . Tobacco use disorder   . Unspecified cataract   . Myalgia and myositis, unspecified   .  Cervicalgia   . Acute upper respiratory infections of unspecified site   . Tobacco use disorder   . Peripheral vascular disease, unspecified ([place])   . Atrial fibrillation ([place])   . Phlebitis and thrombophlebitis of superficial vessels of lower extremities   . Edema   . Long term (current) use of anticoagulants   . Loss of weight   . Abdominal pain, epigastric   . Atrial fibrillation ([place])   . Benign paroxysmal positional vertigo   . Type II or unspecified type diabetes mellitus with peripheral circulatory disorders, uncontrolled(250.72)   . Unspecified vitamin D deficiency   . Other manifestations of  vitamin A deficiency   . Type II or unspecified type diabetes mellitus with peripheral circulatory disorders, not stated as uncontrolled(250.70)   . Primary localized osteoarthrosis, hand   . Abdominal pain, epigastric   . Nontoxic uninodular goiter   . Nonspecific abnormal results of liver function study   . Candidiasis of vulva and vagina   . Routine gynecological examination   . Unspecified disorder of iris and ciliary body   . Spinal stenosis, unspecified region other than cervical   . Other malaise and fatigue   . Pain in joint, pelvic region and thigh   . Cervicalgia   . Other and unspecified hyperlipidemia   . Edema   . Coronary atherosclerosis of unspecified type of vessel, native or graft   . Pain in limb   . Osteoarthrosis, unspecified whether generalized or localized, unspecified site   . Occlusion and stenosis of carotid artery without mention of cerebral infarction   . Lumbago   . Myalgia and myositis, unspecified   . Renal artery stenosis ([place])   . HTN (hypertension) 11/27/2012  . Superficial vein thrombosis 05/11/2015    Left forehead    Past Surgical History  Procedure Laterality Date  . Carotid endarterectomy  2001    Bilateral  . Kidney stone surgery      Removal  . Back surgery  [phone]    three previous surgeries  . Angioplasty  1984 and 1982  . Abdominal hysterectomy  1975  . Percutaneous coronary stent intervention (pci-s)  2002    2 BMS in ostial/Prox & mid RCA (mid 2.5 mm 20x  mm, & 2.75 mm x  8 mm ostial)  . Coronary artery bypass graft  12/04/2012    Procedure: CORONARY ARTERY BYPASS GRAFTING (CABG);  Surgeon: Ivin Poot, MD;  Location: [facility];  Service: Open Heart Surgery;  Laterality: N/A;  . Intraoperative transesophageal echocardiogram  12/04/2012    Procedure: INTRAOPERATIVE TRANSESOPHAGEAL ECHOCARDIOGRAM;  Surgeon: Ivin Poot, MD;  Location: [facility];  Service: Open Heart Surgery;  Laterality: N/A;  . Appendectomy  1954  . Tee with  cardioversion  01/23/2011    EF 99991111; grade 2 diastolic dysfunction, elevated mean LA filling pressure, RV systolic pressure increased consistent w/ mod pulmonary hypertension  . Arch aortogram  03/30/2011    40% recurrent stenosis at origin of R proximal carotid patch, shelf like recurrent stenosis w/in midsection of patch that does not appear to be flow limiting; normal non-stenosed great vessel origins  . Left heart catheterization with coronary angiogram N/A 11/28/2012    Procedure: LEFT HEART CATHETERIZATION WITH CORONARY ANGIOGRAM;  Surgeon: Leonie Man, MD;  Location[facility] CATH LAB;  Service: Cardiovascular;  Laterality: N/A;  . Right heart catheterization N/A 12/02/2012    Procedure: RIGHT HEART CATH;  Surgeon: Troy Sine, MD;  Location: [facility] CATH LAB;  Service: Cardiovascular;  Laterality: N/A;  . Renal angiogram N/A 11/30/2013    Procedure: RENAL ANGIOGRAM;  Surgeon: Lorretta Harp, MD;  Location: [facility] CATH LAB;  Service: Cardiovascular;  Laterality: N/A;    Current Outpatient Prescriptions  Medication Sig Dispense Refill  . acetaminophen-codeine (TYLENOL #3) 300-30 MG per tablet One every 6 hours as needed to control pain 120 tablet 5  . aspirin 81 MG tablet Take 81 mg by mouth daily.     . carvedilol (COREG) 25 MG tablet TAKE 1 TABLET TWICE A DAY 180 tablet 1  . cloNIDine (CATAPRES) 0.1 MG tablet TAKE AS NEEDED IF YOUR SYSTOLIC NUMBER IS GREATER THAN 150 90 tablet 2  . CRESTOR 40 MG tablet TAKE 1 TABLET DAILY TO LOWER CHOLESTEROL 90 tablet 1  . latanoprost (XALATAN) 0.005 % ophthalmic solution Place 1 drop into both eyes at bedtime.    . meloxicam (MOBIC) 15 MG tablet One daily to help arthritis 14 tablet 5  . metFORMIN (GLUCOPHAGE) 1000 MG tablet Take 1 tablet (1,000 mg total) by mouth 2 (two) times daily with a meal. 180 tablet 3  . nitroGLYCERIN (NITROSTAT) 0.4 MG SL tablet Place 0.4 mg under the tongue every 5 (five) minutes as needed for chest pain.    .  valsartan-hydrochlorothiazide (DIOVAN-HCT) 320-25 MG tablet Take 0.5 tablets by mouth 2 (two) times daily. for blood pressure 90 tablet 1   No current facility-administered medications for this visit.    Allergies:   Iron; Amiodarone; Norvasc; Promethazine; Vicodin; and Vioxx    Social History:  The patient  reports that she quit smoking about 2 years ago. Her smoking use included Cigarettes. She has a 17.1 pack-year smoking history. She has never used smokeless tobacco. She reports that she drinks about 0.6 oz of alcohol per week. She reports that she does not use illicit drugs.   Family History:  The patient's family history includes Cancer in her other and sister; Diabetes in her brother, daughter, mother, other, and sister; Heart attack in her father and mother; Heart disease in her brother, brother, brother, father, mother, other, sister, sister, and sister; Other in her mother and other; Stroke in her brother, brother, and brother.    ROS:  Please see the history of present illness. All other systems are reviewed and negative.    PHYSICAL EXAM: VS:  BP 160/90 mmHg  Pulse 58  Ht 5' 2.5" (1.588 m)  Wt 155 lb 9.6 oz (70.58 kg)  BMI 27.99 kg/m2 , BMI Body mass index is 27.99 kg/(m^2). GEN: Well nourished, well developed, female in no acute distress HEENT: normal for age  Neck: JVD 10 cm, positive hepatojugular reflux , no carotid bruit, no masses Cardiac: RRR;  soft  murmur, no rubs, or gallops Respiratory: bibasilar Rales, right greater than left with decreased breath sounds in the base on the left, normal work of breathing GI: soft, nontender, nondistended, + BS MS: no deformity or atrophy, 1-2 plus edema; distal pulses are 2+ in all 4 extremities  Skin: warm and dry, no rash Neuro:  Strength and sensation are intact Psych: euthymic mood, full affect   EKG:  EKG is ordered today. The ekg ordered today demonstrates sinus bradycardia with sinus arrhythmia, heart rate 58 and  normal intervals   Recent Labs: 05/06/2015: ALT 10 09/27/2015: BUN 31*; Creatinine, Ser 1.23*; Potassium 4.7; Sodium 139    Lipid Panel    Component Value Date/Time   CHOL 170 09/27/2015 0822   CHOL 160 11/28/2012  0239   TRIG 55 09/27/2015 0822   HDL 57 09/27/2015 0822   HDL 49 11/28/2012 0239   CHOLHDL 3.0 09/27/2015 0822   CHOLHDL 3.3 11/28/2012 0239   VLDL 12 11/28/2012 0239   LDLCALC 102* 09/27/2015 0822   LDLCALC 99 11/28/2012 0239     Wt Readings from Last 3 Encounters:  11/03/15 155 lb 9.6 oz (70.58 kg)  10/05/15 150 lb 9.6 oz (68.312 kg)  08/25/15 146 lb (66.225 kg)     Other studies Reviewed: Additional studies/ records that were reviewed today include: Office Notes, previous testing.  ASSESSMENT AND PLAN:  1.  Hypertensive heart disease, diastolic CHF: Her EF was normal in 2014 was grade 2 diastolic dysfunction. She now presents with volume overload evidenced by physical exam and history. She is not currently on a diuretic.  We will start Lasix 40 mg daily and K Dur 20 mEq daily for 4 days. She will come in next week for an echocardiogram and a BMET. Because of the Lasix and concerns for her kidney function, we will hold her Diovan HCTZ for now. She is also requested to decreased Mobic.  When her echocardiogram and labs next week are reviewed, the decision can be made regarding changing her diuretic to Lasix chronically versus restarting her Diovan HCT.  2. Hypertension: Her blood pressure has been elevated recently. It is hoped that it will improve with diuresis.  3. Chest pressure: Her chest pain is not in the setting of exertion. It may be associated with her volume overload. We will follow-up on symptoms after she is diuresed to see if further testing is needed.   Current medicines are reviewed at length with the patient today.  The patient does not have concerns regarding medicines.  The following changes have been made:  Hold Diovan HCT, add Lasix and  potassium  Labs/ tests ordered today include:  Orders Placed This Encounter  Procedures  . Basic Metabolic Panel (BMET)  . Basic Metabolic Panel (BMET)  . EKG 12-Lead  . Echocardiogram     Disposition:   FU with Dr. [name]  Signed, Lenoard Aden  11/03/2015 11:43 AM    [address], [place], [place]  [address] Phone: [phone]; Fax: [phone]

## 2015-11-09 ENCOUNTER — Ambulatory Visit (HOSPITAL_COMMUNITY)
Admission: RE | Admit: 2015-11-09 | Discharge: 2015-11-09 | Disposition: A | Discharge status: Home / Self Care | Payer: Medicare Other | Source: Ambulatory Visit | Attending: Physician Assistant | Admitting: Physician Assistant

## 2015-11-09 DIAGNOSIS — I10 Essential (primary) hypertension: Secondary | ICD-10-CM | POA: Insufficient documentation

## 2015-11-09 DIAGNOSIS — E785 Hyperlipidemia, unspecified: Secondary | ICD-10-CM | POA: Insufficient documentation

## 2015-11-09 DIAGNOSIS — E119 Type 2 diabetes mellitus without complications: Secondary | ICD-10-CM | POA: Insufficient documentation

## 2015-11-09 DIAGNOSIS — I059 Rheumatic mitral valve disease, unspecified: Secondary | ICD-10-CM | POA: Insufficient documentation

## 2015-11-09 DIAGNOSIS — I340 Nonrheumatic mitral (valve) insufficiency: Secondary | ICD-10-CM | POA: Diagnosis not present

## 2015-11-09 DIAGNOSIS — I4891 Unspecified atrial fibrillation: Secondary | ICD-10-CM | POA: Insufficient documentation

## 2015-11-09 DIAGNOSIS — I071 Rheumatic tricuspid insufficiency: Secondary | ICD-10-CM | POA: Diagnosis not present

## 2015-11-09 DIAGNOSIS — I517 Cardiomegaly: Secondary | ICD-10-CM | POA: Diagnosis not present

## 2015-11-09 NOTE — Progress Notes (Signed)
  Echocardiogram 2D Echocardiogram has been performed.  Kendra Todd 11/09/2015, 11:50 AM

## 2015-11-10 ENCOUNTER — Telehealth: Payer: Self-pay | Admitting: Physician Assistant

## 2015-11-10 ENCOUNTER — Other Ambulatory Visit: Payer: Self-pay | Admitting: *Deleted

## 2015-11-10 DIAGNOSIS — Z79899 Other long term (current) drug therapy: Secondary | ICD-10-CM

## 2015-11-10 LAB — BASIC METABOLIC PANEL
BUN: 28 mg/dL — AB (ref 7–25)
CO2: 25 mmol/L (ref 20–31)
Calcium: 9 mg/dL (ref 8.6–10.4)
Chloride: 104 mmol/L (ref 98–110)
Creat: 1.31 mg/dL — ABNORMAL HIGH (ref 0.60–0.93)
GLUCOSE: 91 mg/dL (ref 65–99)
Potassium: 5 mmol/L (ref 3.5–5.3)
Sodium: 139 mmol/L (ref 135–146)

## 2015-11-10 NOTE — Telephone Encounter (Signed)
Future lab order released. [facility] rep aware.

## 2015-11-11 NOTE — Progress Notes (Signed)
She still appears volume overloaded and creatinine trending a little better. Would increase diuretic further

## 2015-11-14 ENCOUNTER — Telehealth: Payer: Self-pay | Admitting: *Deleted

## 2015-11-14 NOTE — Telephone Encounter (Signed)
Message sent to Dr. Loletha Grayer.  Patient has an appt with [name] Friday. Is that OK?

## 2015-11-14 NOTE — Telephone Encounter (Signed)
-----   Message from [name], MD sent at 11/11/2015  6:36 PM EST ----- How soon can e please bring her in to the office?. I think she needs more diuretic and a right and left heart cath

## 2015-11-15 ENCOUNTER — Telehealth: Payer: Self-pay | Admitting: *Deleted

## 2015-11-15 MED ORDER — POTASSIUM CHLORIDE CRYS ER 20 MEQ PO TBCR: 20.0000 meq | EXTENDED_RELEASE_TABLET | Freq: Every day | ORAL | Status: DC

## 2015-11-15 MED ORDER — FUROSEMIDE 40 MG PO TABS: 40.0000 mg | ORAL_TABLET | Freq: Every day | ORAL | Status: DC

## 2015-11-15 NOTE — Telephone Encounter (Signed)
Patient only took furosemide and K+ x 4 days.  New Rx sent and patient will restart.  Patient verbalized understanding.

## 2015-11-15 NOTE — Telephone Encounter (Signed)
-----   Message from [name], PA-C sent at 11/14/2015  4:53 PM EST ----- The Lasix was initially only temporary. [name], can you make sure she is still taking the Lasix and K+? Thanks  ----- Message -----    From: [name], MD    Sent: 11/14/2015  11:35 AM      To: [name], PA-C, #  OK. [name], I think she probably needs a right and left heart cath, once she is able to lie flat for the procedure. [name] ----- Message -----    From: [name], CMA    Sent: 11/14/2015  11:20 AM      To: [name], MD  She is seeing [name] this Friday.  Is that OK? ----- Message -----    From: [name], MD    Sent: 11/11/2015   6:36 PM      To: [name], CMA  How soon can e please bring her in to the office?. I think she needs more diuretic and a right and left heart cath

## 2015-11-18 ENCOUNTER — Ambulatory Visit (INDEPENDENT_AMBULATORY_CARE_PROVIDER_SITE_OTHER): Payer: Medicare Other | Admitting: Physician Assistant

## 2015-11-18 ENCOUNTER — Encounter: Payer: Self-pay | Admitting: Physician Assistant

## 2015-11-18 VITALS — BP 108/70 | HR 56 | Ht 62.5 in | Wt 150.3 lb

## 2015-11-18 DIAGNOSIS — I25810 Atherosclerosis of coronary artery bypass graft(s) without angina pectoris: Secondary | ICD-10-CM | POA: Diagnosis not present

## 2015-11-18 DIAGNOSIS — I5033 Acute on chronic diastolic (congestive) heart failure: Secondary | ICD-10-CM | POA: Diagnosis not present

## 2015-11-18 DIAGNOSIS — Z79899 Other long term (current) drug therapy: Secondary | ICD-10-CM | POA: Diagnosis not present

## 2015-11-18 LAB — BASIC METABOLIC PANEL
BUN: 35 mg/dL — AB (ref 7–25)
CHLORIDE: 102 mmol/L (ref 98–110)
CO2: 30 mmol/L (ref 20–31)
CREATININE: 1.57 mg/dL — AB (ref 0.60–0.93)
Calcium: 9.7 mg/dL (ref 8.6–10.4)
Glucose, Bld: 76 mg/dL (ref 65–99)
Potassium: 4.7 mmol/L (ref 3.5–5.3)
Sodium: 141 mmol/L (ref 135–146)

## 2015-11-18 NOTE — Progress Notes (Signed)
Cardiology Office Note   Date:  11/18/2015   ID:  Kendra Todd, DOB Feb 03, 1941, MRN [id number]  PCP:  Estill Dooms, MD  Cardiologist:  Dr Hughie Closs, [name], PA-C   History of Present Illness: Kendra Todd is a 75 y.o. female with a history of CABG 2014, HTN, hypertensive heart dz, thoraco-abd AA, carotid dz, HL, PAF not anticoagulated due to GIB.  Seen in office 12/29 w/ SOB and chest pressure. Lasix initiated and echo done w/ PAS 73, nl EF. Dr [name] reviewed and rec R/L heart cath once medically stable.  Kendra Todd presents for further evaluation.  She has been compliant with the Lasix and potassium. She has had some leg cramps but in general has done pretty well with it. She is breathing much better. She has more energy and is able to do more around the house. She has not had any chest discomfort since she was diuresed. She is tracking her weight and feels that she is at or slightly above her dry weight. She now has shortness of breath only with overexertion. Otherwise, she has only a small amount of daytime edema, and denies orthopnea or PND. She is generally doing much better.  One day, she skipped her blood pressure medications because her systolic blood pressure was in the 90s. Otherwise, she has taken them but will miss a dose if her blood pressure runs too low. She wonders if she is going to have to keep taking the Lasix.   Past Medical History  Diagnosis Date  . Arthritis   . Atrial fibrillation ([place]) 02/21/2011  . Hypertension   . Hyperlipidemia   . Myocardial infarction ([place])   . Diabetes mellitus age 89  . CAD (coronary artery disease)     prior coronary stenting  . Stroke ([name]) 2001  . Peripheral vascular disease ([place])   . Carotid artery disease ([name]) 2001    s/p Bilateral CEA, after CVA  . TR (tricuspid regurgitation), severe 12/02/12 for repair with CABG 12/03/2012  . Pulmonary HTN ([name]) 12/03/2012  . Dyspnea 03/13/2006    Myoview - EF 76%; mild  ischemia in apical lateral region, less severe than previous study in 2002  . H/O endarterectomy 09/11/2012    patent L and R carotid sites, R 60-79% restenosed ICA; L <40% restenosed ICA  . Stenosis of artery ([place]) 06/03/2012    doppler- most distal aspect of abd aorta no aneurysmal dilation; bilateral ABIs - mild L arterial insufficiency, L CIA narrowing w/ 50-69% diameter reduction; L and R SFA both w/ 0-49% diameter reductions  . H/O endarterectomy 01/08/2012    doppler - R distal common carotid/proximal ICA stenosed 80-99%, L 0-39%  . Decreased pedal pulses 06/04/2011    doppler - bilateral ABIs no evidence of insufficiency; L CIA slightly elevated velocities 20-30% diameter reduction;   . Limb pain 05/02/2011    doppler - no evidence of thrombus of thrombophlebitis  . Diabetes ([name])   . H/O carotid endarterectomy 03/06/2012    CT angio -high grade stenosis of proximal R internal carotid w/ lg posterior ulceration or dissection flap; near occlusive stenosis at origin of nondominantproximal L vertebral artery; <50% stenosis of proximal R vertebral and proximal L subclavian artery  . Urinary tract infection, site not specified   . Tobacco use disorder   . Unspecified cataract   . Myalgia and myositis, unspecified   . Cervicalgia   . Acute upper respiratory infections of unspecified site   .  Tobacco use disorder   . Peripheral vascular disease, unspecified ([place])   . Atrial fibrillation ([place])   . Phlebitis and thrombophlebitis of superficial vessels of lower extremities   . Edema   . Long term (current) use of anticoagulants   . Loss of weight   . Abdominal pain, epigastric   . Atrial fibrillation ([name])   . Benign paroxysmal positional vertigo   . Type II or unspecified type diabetes mellitus with peripheral circulatory disorders, uncontrolled(250.72)   . Unspecified vitamin D deficiency   . Other manifestations of vitamin A deficiency   . Type II or unspecified type diabetes mellitus with  peripheral circulatory disorders, not stated as uncontrolled(250.70)   . Primary localized osteoarthrosis, hand   . Abdominal pain, epigastric   . Nontoxic uninodular goiter   . Nonspecific abnormal results of liver function study   . Candidiasis of vulva and vagina   . Routine gynecological examination   . Unspecified disorder of iris and ciliary body   . Spinal stenosis, unspecified region other than cervical   . Other malaise and fatigue   . Pain in joint, pelvic region and thigh   . Cervicalgia   . Other and unspecified hyperlipidemia   . Edema   . Coronary atherosclerosis of unspecified type of vessel, native or graft   . Pain in limb   . Osteoarthrosis, unspecified whether generalized or localized, unspecified site   . Occlusion and stenosis of carotid artery without mention of cerebral infarction   . Lumbago   . Myalgia and myositis, unspecified   . Renal artery stenosis ([name])   . HTN (hypertension) 11/27/2012  . Superficial vein thrombosis 05/11/2015    Left forehead    Past Surgical History  Procedure Laterality Date  . Carotid endarterectomy  2001    Bilateral  . Kidney stone surgery      Removal  . Back surgery  [phone]    three previous surgeries  . Angioplasty  1984 and 1982  . Abdominal hysterectomy  1975  . Percutaneous coronary stent intervention (pci-s)  2002    2 BMS in ostial/Prox & mid RCA (mid 2.5 mm 20x  mm, & 2.75 mm x  8 mm ostial)  . Coronary artery bypass graft  12/04/2012    Procedure: CORONARY ARTERY BYPASS GRAFTING (CABG);  Surgeon: Ivin Poot, MD;  Location: [facility];  Service: Open Heart Surgery;  Laterality: N/A;  . Intraoperative transesophageal echocardiogram  12/04/2012    Procedure: INTRAOPERATIVE TRANSESOPHAGEAL ECHOCARDIOGRAM;  Surgeon: Ivin Poot, MD;  Location: [facility];  Service: Open Heart Surgery;  Laterality: N/A;  . Appendectomy  1954  . Tee with cardioversion  01/23/2011    EF 99991111; grade 2 diastolic dysfunction,  elevated mean LA filling pressure, RV systolic pressure increased consistent w/ mod pulmonary hypertension  . Arch aortogram  03/30/2011    40% recurrent stenosis at origin of R proximal carotid patch, shelf like recurrent stenosis w/in midsection of patch that does not appear to be flow limiting; normal non-stenosed great vessel origins  . Left heart catheterization with coronary angiogram N/A 11/28/2012    Procedure: LEFT HEART CATHETERIZATION WITH CORONARY ANGIOGRAM;  Surgeon: Leonie Man, MD;  Location: [facility] CATH LAB;  Service: Cardiovascular;  Laterality: N/A;  . Right heart catheterization N/A 12/02/2012    Procedure: RIGHT HEART CATH;  Surgeon: Troy Sine, MD;  Location[facility] CATH LAB;  Service: Cardiovascular;  Laterality: N/A;  . Renal angiogram N/A 11/30/2013  Procedure: RENAL ANGIOGRAM;  Surgeon: Lorretta Harp, MD;  Location: [facility] CATH LAB;  Service: Cardiovascular;  Laterality: N/A;    Current Outpatient Prescriptions  Medication Sig Dispense Refill  . acetaminophen-codeine (TYLENOL #3) 300-30 MG tablet Take 1 tablet by mouth every 6 (six) hours as needed for moderate pain.    [name] aspirin 81 MG tablet Take 81 mg by mouth daily.     . carvedilol (COREG) 25 MG tablet Take 25 mg by mouth 2 (two) times daily with a meal.    . cloNIDine (CATAPRES) 0.1 MG tablet Take 0.1 mg by mouth as needed (SYSTOLIC BP > [id number]).    . furosemide (LASIX) 40 MG tablet Take 1 tablet (40 mg total) by mouth daily. 30 tablet 0  . latanoprost (XALATAN) 0.005 % ophthalmic solution Place 1 drop into both eyes at bedtime.    . meloxicam (MOBIC) 7.5 MG tablet Take 7.5 mg by mouth daily. FOR ARTHRITIS    . metFORMIN (GLUCOPHAGE) 1000 MG tablet Take 1 tablet (1,000 mg total) by mouth 2 (two) times daily with a meal. 180 tablet 3  . nitroGLYCERIN (NITROSTAT) 0.4 MG SL tablet Place 0.4 mg under the tongue every 5 (five) minutes as needed for chest pain.    . potassium chloride SA (K-DUR,KLOR-CON) 20 MEQ tablet Take 1  tablet (20 mEq total) by mouth daily. 30 tablet 0  . rosuvastatin (CRESTOR) 40 MG tablet Take 40 mg by mouth daily.     No current facility-administered medications for this visit.    Allergies:   Iron; Amiodarone; Norvasc; Promethazine; Vicodin; and Vioxx    Social History:  The patient  reports that she quit smoking about 2 years ago. Her smoking use included Cigarettes. She has a 17.1 pack-year smoking history. She has never used smokeless tobacco. She reports that she drinks about 0.6 oz of alcohol per week. She reports that she does not use illicit drugs.   Family History:  The patient's family history includes Cancer in her other and sister; Diabetes in her brother, daughter, mother, other, and sister; Heart attack in her father and mother; Heart disease in her brother, brother, brother, father, mother, other, sister, sister, and sister; Other in her mother and other; Stroke in her brother, brother, and brother.    ROS:  Please see the history of present illness. All other systems are reviewed and negative.    PHYSICAL EXAM: VS:  BP 108/70 mmHg  Pulse 56  Ht 5' 2.5" (1.588 m)  Wt 150 lb 4.8 oz (68.176 kg)  BMI 27.04 kg/m2 , BMI Body mass index is 27.04 kg/(m^2). GEN: Well nourished, well developed, female in no acute distress HEENT: normal for age  Neck: Minimal JVD, no carotid bruit, no masses Cardiac: RRR; no murmur, no rubs, or gallops Respiratory:  clear to auscultation bilaterally, normal work of breathing GI: soft, nontender, nondistended, + BS MS: no deformity or atrophy; no edema; distal pulses are 2+ in all 4 extremities  Skin: warm and dry, no rash Neuro:  Strength and sensation are intact Psych: euthymic mood, full affect   EKG:  EKG is not ordered today.  ECHO: 11/09/2015 - Left ventricle: The cavity size was normal. There was moderate concentric hypertrophy. Systolic function was normal. The estimated ejection fraction was in the range of 60% to 65%.  Wall motion was normal; there were no regional wall motion abnormalities. Features are consistent with a pseudonormal left ventricular filling pattern, with concomitant abnormal relaxation and increased filling pressure (  grade 2 diastolic dysfunction). - Mitral valve: Calcified annulus. There was moderate regurgitation directed eccentrically. - Left atrium: The atrium was mildly dilated. - Right ventricle: The cavity size was mildly dilated. Wall thickness was normal. - Right atrium: The atrium was moderately dilated. - Tricuspid valve: There was moderate regurgitation. - Pulmonary arteries: Systolic pressure was severely increased. PA peak pressure: 73 mm Hg (S). Impressions: - Compared to the prior study, there has been no significant interval change.  Recent Labs: 05/06/2015: ALT 10 11/10/2015: BUN 28*; Creat 1.31*; Potassium 5.0; Sodium 139    Lipid Panel    Component Value Date/Time   CHOL 170 09/27/2015 0822   CHOL 160 11/28/2012 0239   TRIG 55 09/27/2015 0822   HDL 57 09/27/2015 0822   HDL 49 11/28/2012 0239   CHOLHDL 3.0 09/27/2015 0822   CHOLHDL 3.3 11/28/2012 0239   VLDL 12 11/28/2012 0239   LDLCALC 102* 09/27/2015 0822   LDLCALC 99 11/28/2012 0239     Wt Readings from Last 3 Encounters:  11/18/15 150 lb 4.8 oz (68.176 kg)  11/03/15 155 lb 9.6 oz (70.58 kg)  10/05/15 150 lb 9.6 oz (68.312 kg)     Other studies Reviewed: Additional studies/ records that were reviewed today include: Office Notes and other records.  ASSESSMENT AND PLAN:  1. Acute on chronic diastolic CHF: Her symptoms have greatly improved. Her weight is down 5 pounds. She states that her dry weight at home is about 145 pounds and she is just a little above that.   She will need to keep taking the Lasix at some level. However, it is okay for her to cut back the Lasix to whatever dosage is necessary in order to maintain her current weight. I advised her that I did not want her to  lose much more weight, a pound or 2 is okay. She agrees.   Because of the leg cramping, we will check a BMET today to make sure that she is getting adequate potassium supplementation. She struggles taking the potassium, perhaps she can dissolve it in applesauce to make it easier to get it down because the pills are so large.  2. CAD: Dr. [name] had recommended that she have a right and left heart cath once her respiratory status was improved, because of her chest discomfort. However, the patient is very reluctant to have this done. Her previous catheterizations have been done through her right groin and she says there is a lot of scar tissue in that area and they're having trouble getting up through there. She was advised that we would go to the right wrist if at all possible, but she still does not wish to have the catheterization done at this time. She has had no more ischemic symptoms, since the fluid status improved. She was advised that if she has more chest discomfort she is to call us immediately and we will need to set it up then. She is in agreement with this. Continue aspirin, carvedilol, and Crestor.   Current medicines are reviewed at length with the patient today.  The patient does not have concerns regarding medicines.  The following changes have been made:  Adjust the Lasix as needed to maintain her current weight take the potassium when you take the Lasix  Labs/ tests ordered today include:   Orders Placed This Encounter  Procedures  . Basic metabolic panel     Disposition:   FU with Dr. [name]  Signed, [name], PA-C  11/18/2015 12:57  PM    [facility] [place], [place], [place]  [address] Phone: [phone]; Fax: [phone]

## 2015-11-18 NOTE — Patient Instructions (Signed)
Your physician recommends that you return for lab work TODAY.   Your physician recommends that you schedule a follow-up appointment in: 3 months with Dr [name].

## 2015-11-23 ENCOUNTER — Other Ambulatory Visit: Payer: Self-pay

## 2015-11-23 MED ORDER — ACETAMINOPHEN-CODEINE #3 300-30 MG PO TABS: 1.0000 | ORAL_TABLET | Freq: Four times a day (QID) | ORAL | Status: DC | PRN

## 2015-12-14 ENCOUNTER — Other Ambulatory Visit: Payer: Self-pay | Admitting: Cardiovascular Disease

## 2015-12-14 DIAGNOSIS — I739 Peripheral vascular disease, unspecified: Secondary | ICD-10-CM

## 2015-12-21 ENCOUNTER — Encounter (HOSPITAL_COMMUNITY): Payer: Medicare Other

## 2015-12-21 ENCOUNTER — Ambulatory Visit (HOSPITAL_COMMUNITY)
Admission: RE | Admit: 2015-12-21 | Discharge: 2015-12-21 | Disposition: A | Discharge status: Home / Self Care | Payer: Medicare Other | Source: Ambulatory Visit | Attending: Cardiovascular Disease | Admitting: Cardiovascular Disease

## 2015-12-21 DIAGNOSIS — I10 Essential (primary) hypertension: Secondary | ICD-10-CM | POA: Insufficient documentation

## 2015-12-21 DIAGNOSIS — E785 Hyperlipidemia, unspecified: Secondary | ICD-10-CM | POA: Diagnosis not present

## 2015-12-21 DIAGNOSIS — I739 Peripheral vascular disease, unspecified: Secondary | ICD-10-CM

## 2015-12-21 DIAGNOSIS — R938 Abnormal findings on diagnostic imaging of other specified body structures: Secondary | ICD-10-CM | POA: Diagnosis not present

## 2015-12-21 DIAGNOSIS — Z720 Tobacco use: Secondary | ICD-10-CM | POA: Insufficient documentation

## 2015-12-21 DIAGNOSIS — E119 Type 2 diabetes mellitus without complications: Secondary | ICD-10-CM | POA: Diagnosis not present

## 2016-01-10 DIAGNOSIS — M859 Disorder of bone density and structure, unspecified: Secondary | ICD-10-CM | POA: Diagnosis not present

## 2016-01-10 DIAGNOSIS — M25561 Pain in right knee: Secondary | ICD-10-CM | POA: Diagnosis not present

## 2016-01-10 DIAGNOSIS — M159 Polyosteoarthritis, unspecified: Secondary | ICD-10-CM | POA: Diagnosis not present

## 2016-01-10 DIAGNOSIS — M545 Low back pain: Secondary | ICD-10-CM | POA: Diagnosis not present

## 2016-01-13 ENCOUNTER — Other Ambulatory Visit: Payer: Self-pay | Admitting: Vascular Surgery

## 2016-01-13 DIAGNOSIS — I6523 Occlusion and stenosis of bilateral carotid arteries: Secondary | ICD-10-CM | POA: Diagnosis not present

## 2016-01-13 DIAGNOSIS — Z01812 Encounter for preprocedural laboratory examination: Secondary | ICD-10-CM | POA: Diagnosis not present

## 2016-01-13 DIAGNOSIS — I716 Thoracoabdominal aortic aneurysm, without rupture: Secondary | ICD-10-CM | POA: Diagnosis not present

## 2016-01-13 LAB — CREATININE, ISTAT: CREATININE, ISTAT: 1.6 mg/dL — AB (ref 0.6–1.3)

## 2016-01-18 ENCOUNTER — Other Ambulatory Visit: Payer: Self-pay | Admitting: *Deleted

## 2016-01-18 DIAGNOSIS — I716 Thoracoabdominal aortic aneurysm, without rupture, unspecified: Secondary | ICD-10-CM

## 2016-01-18 DIAGNOSIS — I714 Abdominal aortic aneurysm, without rupture, unspecified: Secondary | ICD-10-CM

## 2016-01-19 ENCOUNTER — Ambulatory Visit
Admission: RE | Admit: 2016-01-19 | Discharge: 2016-01-19 | Disposition: A | Discharge status: Home / Self Care | Payer: Medicare Other | Source: Ambulatory Visit | Attending: Vascular Surgery | Admitting: Vascular Surgery

## 2016-01-19 DIAGNOSIS — I716 Thoracoabdominal aortic aneurysm, without rupture, unspecified: Secondary | ICD-10-CM

## 2016-01-19 DIAGNOSIS — I714 Abdominal aortic aneurysm, without rupture: Secondary | ICD-10-CM | POA: Diagnosis not present

## 2016-01-19 DIAGNOSIS — I712 Thoracic aortic aneurysm, without rupture: Secondary | ICD-10-CM | POA: Diagnosis not present

## 2016-01-19 MED ORDER — IOPAMIDOL (ISOVUE-370) INJECTION 76%
60.0000 mL | Freq: Once | INTRAVENOUS | Status: AC | PRN
  Administered 2016-01-19: 60 mL via INTRAVENOUS

## 2016-01-20 ENCOUNTER — Telehealth: Payer: Self-pay

## 2016-01-20 NOTE — Telephone Encounter (Signed)
rec'd call report on CTA chest, abd/ pelvis; final report in EPIC.  Forwarded report to Dr. Oneida Alar to review.  With Dr. Oneida Alar on vacation, reviewed results with Dr. Bridgett Larsson.  Per Dr. Bridgett Larsson, okay for awaiting Dr. Oneida Alar return to review.  Pt. Has an office appt. 02/09/16 with Dr. Oneida Alar.  No action taken at this time.

## 2016-01-24 DIAGNOSIS — H2513 Age-related nuclear cataract, bilateral: Secondary | ICD-10-CM | POA: Diagnosis not present

## 2016-01-24 DIAGNOSIS — H35033 Hypertensive retinopathy, bilateral: Secondary | ICD-10-CM | POA: Diagnosis not present

## 2016-01-24 DIAGNOSIS — E119 Type 2 diabetes mellitus without complications: Secondary | ICD-10-CM | POA: Diagnosis not present

## 2016-01-26 ENCOUNTER — Ambulatory Visit: Payer: Medicare Other | Admitting: Vascular Surgery

## 2016-01-30 ENCOUNTER — Other Ambulatory Visit: Payer: Self-pay | Admitting: Cardiovascular Disease

## 2016-01-31 NOTE — Telephone Encounter (Signed)
REFILL 

## 2016-02-02 ENCOUNTER — Other Ambulatory Visit: Payer: Medicare Other

## 2016-02-03 ENCOUNTER — Encounter: Payer: Self-pay | Admitting: Vascular Surgery

## 2016-02-06 ENCOUNTER — Other Ambulatory Visit: Payer: Medicare Other

## 2016-02-06 DIAGNOSIS — I10 Essential (primary) hypertension: Secondary | ICD-10-CM

## 2016-02-06 DIAGNOSIS — N182 Chronic kidney disease, stage 2 (mild): Principal | ICD-10-CM

## 2016-02-06 DIAGNOSIS — E1122 Type 2 diabetes mellitus with diabetic chronic kidney disease: Secondary | ICD-10-CM

## 2016-02-06 DIAGNOSIS — E785 Hyperlipidemia, unspecified: Secondary | ICD-10-CM

## 2016-02-07 LAB — LIPID PANEL
CHOLESTEROL TOTAL: 159 mg/dL (ref 100–199)
Chol/HDL Ratio: 2.6 ratio units (ref 0.0–4.4)
HDL: 61 mg/dL (ref >39)
LDL CALC: 88 mg/dL (ref 0–99)
TRIGLYCERIDES: 50 mg/dL (ref 0–149)
VLDL Cholesterol Cal: 10 mg/dL (ref 5–40)

## 2016-02-07 LAB — COMPREHENSIVE METABOLIC PANEL
A/G RATIO: 1.6 (ref 1.2–2.2)
ALBUMIN: 4.1 g/dL (ref 3.5–4.8)
ALK PHOS: 53 IU/L (ref 39–117)
ALT: 9 IU/L (ref 0–32)
AST: 19 IU/L (ref 0–40)
BILIRUBIN TOTAL: 0.4 mg/dL (ref 0.0–1.2)
BUN / CREAT RATIO: 18 (ref 12–28)
BUN: 32 mg/dL — AB (ref 8–27)
CHLORIDE: 100 mmol/L (ref 96–106)
CO2: 22 mmol/L (ref 18–29)
Calcium: 9.2 mg/dL (ref 8.7–10.3)
Creatinine, Ser: 1.81 mg/dL — ABNORMAL HIGH (ref 0.57–1.00)
GFR, EST AFRICAN AMERICAN: 31 mL/min/{1.73_m2} — AB (ref >59)
GFR, EST NON AFRICAN AMERICAN: 27 mL/min/{1.73_m2} — AB (ref >59)
GLUCOSE: 111 mg/dL — AB (ref 65–99)
Globulin, Total: 2.5 g/dL (ref 1.5–4.5)
POTASSIUM: 5.3 mmol/L — AB (ref 3.5–5.2)
Sodium: 137 mmol/L (ref 134–144)
Total Protein: 6.6 g/dL (ref 6.0–8.5)

## 2016-02-07 LAB — HEMOGLOBIN A1C
ESTIMATED AVERAGE GLUCOSE: 140 mg/dL
Hgb A1c MFr Bld: 6.5 % — ABNORMAL HIGH (ref 4.8–5.6)

## 2016-02-07 LAB — MICROALBUMIN, URINE: Microalbumin, Urine: 63.1 ug/mL

## 2016-02-08 ENCOUNTER — Encounter: Payer: Self-pay | Admitting: Internal Medicine

## 2016-02-08 ENCOUNTER — Ambulatory Visit (INDEPENDENT_AMBULATORY_CARE_PROVIDER_SITE_OTHER): Payer: Medicare Other | Admitting: Internal Medicine

## 2016-02-08 VITALS — BP 110/62 | HR 50 | Temp 97.9°F | Ht 62.0 in | Wt 155.0 lb

## 2016-02-08 DIAGNOSIS — I25810 Atherosclerosis of coronary artery bypass graft(s) without angina pectoris: Secondary | ICD-10-CM

## 2016-02-08 DIAGNOSIS — E785 Hyperlipidemia, unspecified: Secondary | ICD-10-CM | POA: Diagnosis not present

## 2016-02-08 DIAGNOSIS — I10 Essential (primary) hypertension: Secondary | ICD-10-CM

## 2016-02-08 DIAGNOSIS — I4891 Unspecified atrial fibrillation: Secondary | ICD-10-CM | POA: Diagnosis not present

## 2016-02-08 DIAGNOSIS — N182 Chronic kidney disease, stage 2 (mild): Secondary | ICD-10-CM | POA: Diagnosis not present

## 2016-02-08 DIAGNOSIS — E1122 Type 2 diabetes mellitus with diabetic chronic kidney disease: Secondary | ICD-10-CM

## 2016-02-08 DIAGNOSIS — I2510 Atherosclerotic heart disease of native coronary artery without angina pectoris: Secondary | ICD-10-CM | POA: Diagnosis not present

## 2016-02-08 NOTE — Progress Notes (Signed)
Patient ID: Kendra Todd, female   DOB: October 21, 1941, 75 y.o.   MRN: [id number]   Location:  [facility]  Provider: Jeanmarie Hubert ,MD  Code Status: full Goals of Care:  Advanced Directives 02/08/2016  Does patient have an advance directive? Yes  Type of Paramedic of [place];Living will  Copy of advanced directive(s) in chart? No - copy requested  Would patient like information on creating an advanced directive? -     Chief Complaint  Patient presents with  . Medical Management of Chronic Issues    blood sugar, blood pressure, cholesterol, CAD. Checks blood sugar once a week, last BS was 114.    HPI: Patient is a 75 y.o. female seen today for medical management of chronic diseases.    Pain in the lower legs andf the bottom of the feet. Feet are tingling and numb.  Type 2 diabetes mellitus with stage 2 chronic kidney disease, without long-term current use of insulin (HCC) - controlled  HLD (hyperlipidemia) - controlled  Essential hypertension - controlled  Coronary artery disease involving native coronary artery of native heart without angina pectoris - occ chest pain quickly relieved with NTG  Atrial fibrillation, unspecified type ([place]) - denies palpitations. Currently in NSR.   Past Medical History  Diagnosis Date  . Arthritis   . Atrial fibrillation ([place]) 02/21/2011  . Hypertension   . Hyperlipidemia   . Myocardial infarction ([place])   . Diabetes mellitus age 72  . CAD (coronary artery disease)     prior coronary stenting  . Stroke ([place]) 2001  . Peripheral vascular disease ([place])   . Carotid artery disease ([place]) 2001    s/p Bilateral CEA, after CVA  . TR (tricuspid regurgitation), severe 12/02/12 for repair with CABG 12/03/2012  . Pulmonary HTN ([place]) 12/03/2012  . Dyspnea 03/13/2006    Myoview - EF 76%; mild ischemia in apical lateral region, less severe than previous study in 2002  . H/O endarterectomy 09/11/2012    patent L and R carotid sites, R  60-79% restenosed ICA; L <40% restenosed ICA  . Stenosis of artery ([place]) 06/03/2012    doppler- most distal aspect of abd aorta no aneurysmal dilation; bilateral ABIs - mild L arterial insufficiency, L CIA narrowing w/ 50-69% diameter reduction; L and R SFA both w/ 0-49% diameter reductions  . H/O endarterectomy 01/08/2012    doppler - R distal common carotid/proximal ICA stenosed 80-99%, L 0-39%  . Decreased pedal pulses 06/04/2011    doppler - bilateral ABIs no evidence of insufficiency; L CIA slightly elevated velocities 20-30% diameter reduction;   . Limb pain 05/02/2011    doppler - no evidence of thrombus of thrombophlebitis  . Diabetes ([place])   . H/O carotid endarterectomy 03/06/2012    CT angio -high grade stenosis of proximal R internal carotid w/ lg posterior ulceration or dissection flap; near occlusive stenosis at origin of nondominantproximal L vertebral artery; <50% stenosis of proximal R vertebral and proximal L subclavian artery  . Urinary tract infection, site not specified   . Tobacco use disorder   . Unspecified cataract   . Myalgia and myositis, unspecified   . Cervicalgia   . Acute upper respiratory infections of unspecified site   . Tobacco use disorder   . Peripheral vascular disease, unspecified ([place])   . Atrial fibrillation ([place])   . Phlebitis and thrombophlebitis of superficial vessels of lower extremities   . Edema   . Long term (current) use of anticoagulants   .  Loss of weight   . Abdominal pain, epigastric   . Atrial fibrillation ([place])   . Benign paroxysmal positional vertigo   . Type II or unspecified type diabetes mellitus with peripheral circulatory disorders, uncontrolled(250.72)   . Unspecified vitamin D deficiency   . Other manifestations of vitamin A deficiency   . Type II or unspecified type diabetes mellitus with peripheral circulatory disorders, not stated as uncontrolled(250.70)   . Primary localized osteoarthrosis, hand   . Abdominal pain, epigastric    . Nontoxic uninodular goiter   . Nonspecific abnormal results of liver function study   . Candidiasis of vulva and vagina   . Routine gynecological examination   . Unspecified disorder of iris and ciliary body   . Spinal stenosis, unspecified region other than cervical   . Other malaise and fatigue   . Pain in joint, pelvic region and thigh   . Cervicalgia   . Other and unspecified hyperlipidemia   . Edema   . Coronary atherosclerosis of unspecified type of vessel, native or graft   . Pain in limb   . Osteoarthrosis, unspecified whether generalized or localized, unspecified site   . Occlusion and stenosis of carotid artery without mention of cerebral infarction   . Lumbago   . Myalgia and myositis, unspecified   . Renal artery stenosis ([place])   . HTN (hypertension) 11/27/2012  . Superficial vein thrombosis 05/11/2015    Left forehead    Past Surgical History  Procedure Laterality Date  . Carotid endarterectomy  2001    Bilateral  . Kidney stone surgery      Removal  . Back surgery  [phone]    three previous surgeries  . Angioplasty  1984 and 1982  . Abdominal hysterectomy  1975  . Percutaneous coronary stent intervention (pci-s)  2002    2 BMS in ostial/Prox & mid RCA (mid 2.5 mm 20x  mm, & 2.75 mm x  8 mm ostial)  . Coronary artery bypass graft  12/04/2012    Procedure: CORONARY ARTERY BYPASS GRAFTING (CABG);  Surgeon: Ivin Poot, MD;  Location: [facility];  Service: Open Heart Surgery;  Laterality: N/A;  . Intraoperative transesophageal echocardiogram  12/04/2012    Procedure: INTRAOPERATIVE TRANSESOPHAGEAL ECHOCARDIOGRAM;  Surgeon: Ivin Poot, MD;  Location: [facility];  Service: Open Heart Surgery;  Laterality: N/A;  . Appendectomy  1954  . Tee with cardioversion  01/23/2011    EF 99991111; grade 2 diastolic dysfunction, elevated mean LA filling pressure, RV systolic pressure increased consistent w/ mod pulmonary hypertension  . Arch aortogram  03/30/2011    40%  recurrent stenosis at origin of R proximal carotid patch, shelf like recurrent stenosis w/in midsection of patch that does not appear to be flow limiting; normal non-stenosed great vessel origins  . Left heart catheterization with coronary angiogram N/A 11/28/2012    Procedure: LEFT HEART CATHETERIZATION WITH CORONARY ANGIOGRAM;  Surgeon: Leonie Man, MD;  Location: [facility] CATH LAB;  Service: Cardiovascular;  Laterality: N/A;  . Right heart catheterization N/A 12/02/2012    Procedure: RIGHT HEART CATH;  Surgeon: Troy Sine, MD;  Location: [facility] CATH LAB;  Service: Cardiovascular;  Laterality: N/A;  . Renal angiogram N/A 11/30/2013    Procedure: RENAL ANGIOGRAM;  Surgeon: Lorretta Harp, MD;  Location: [facility] CATH LAB;  Service: Cardiovascular;  Laterality: N/A;    Allergies  Allergen Reactions  . Iron Hives  . Amiodarone Nausea And Vomiting  . Norvasc [Amlodipine Besylate] Nausea And Vomiting  .  Promethazine Other (See Comments)    hallucinations  . Vicodin [Hydrocodone-Acetaminophen] Other (See Comments)    Caused arrythmia   . Vioxx [Rofecoxib] Nausea And Vomiting      Medication List       This list is accurate as of: 02/08/16 12:12 PM.  Always use your most recent med list.               acetaminophen-codeine 300-30 MG tablet  Commonly known as:  TYLENOL #3  Take 1 tablet by mouth every 6 (six) hours as needed for moderate pain.     aspirin 81 MG tablet  Take 81 mg by mouth daily.     carvedilol 25 MG tablet  Commonly known as:  COREG  Take 25 mg by mouth 2 (two) times daily with a meal.     cloNIDine 0.1 MG tablet  Commonly known as:  CATAPRES  Take 0.1 mg by mouth as needed (SYSTOLIC BP > [id number]).     CRESTOR 40 MG tablet  Generic drug:  rosuvastatin  Take 40 mg by mouth daily.     furosemide 40 MG tablet  Commonly known as:  LASIX  Take 1 tablet (40 mg total) by mouth daily.     latanoprost 0.005 % ophthalmic solution  Commonly known as:  XALATAN  Place 1 drop  into both eyes at bedtime.     LUMIGAN 0.01 % Soln  Generic drug:  bimatoprost     meloxicam 7.5 MG tablet  Commonly known as:  MOBIC  Take 7.5 mg by mouth daily. FOR ARTHRITIS     metFORMIN 1000 MG tablet  Commonly known as:  GLUCOPHAGE  Take 1 tablet (1,000 mg total) by mouth 2 (two) times daily with a meal.     nitroGLYCERIN 0.4 MG SL tablet  Commonly known as:  NITROSTAT  Place 0.4 mg under the tongue every 5 (five) minutes as needed for chest pain.     potassium chloride SA 20 MEQ tablet  Commonly known as:  K-DUR,KLOR-CON  Take 1 tablet (20 mEq total) by mouth daily.     valsartan-hydrochlorothiazide 320-25 MG tablet  Commonly known as:  DIOVAN-HCT  TAKE ONE-HALF (1/2) TABLET TWICE A DAY FOR BLOOD PRESSURE        Review of Systems:  Review of Systems  Constitutional: Negative.   HENT: Negative.   Eyes: Negative.   Respiratory: Negative.   Cardiovascular: Negative for chest pain, palpitations and leg swelling.       Hx of AF; currently in NSR  Endocrine: Negative for polydipsia, polyphagia and polyuria.       History diabetes  Genitourinary: Positive for urgency and frequency. Negative for flank pain.  Musculoskeletal: Positive for back pain and arthralgias.  Skin:       Rash on the left chest wall resolved  Neurological:       Intermittent numbness, tingking, and discomfort in both feet  Hematological: Negative.   Psychiatric/Behavioral: Negative.     Health Maintenance  Topic Date Due  . ZOSTAVAX  02/15/1999  . DEXA SCAN  02/15/2004  . PNA vac Low Risk Adult (2 of 2 - PCV13) 11/13/2012  . OPHTHALMOLOGY EXAM  10/27/2015  . FOOT EXAM  01/12/2016  . INFLUENZA VACCINE  11/05/2016 (Originally 06/05/2016)  . HEMOGLOBIN A1C  08/07/2016  . TETANUS/TDAP  11/13/2021    Physical Exam: Filed Vitals:   02/08/16 1138  BP: 110/62  Pulse: 50  Temp: 97.9 F (36.6 C)  TempSrc: Oral  Height: 5\' 2"  (1.575 m)  Weight: 155 lb (70.308 kg)  SpO2: 97%   Body mass  index is 28.34 kg/(m^2). Physical Exam  Constitutional: She is oriented to person, place, and time. She appears well-developed and well-nourished. No distress.  HENT:  Head: Normocephalic.  Nose: Nose normal.  Upper and lower dentures present.  Eyes: Conjunctivae are normal. Pupils are equal, round, and reactive to light.  Wears prescription lenses. Right cataract.  Neck: Normal range of motion. Neck supple. No JVD present. No tracheal deviation present. No thyromegaly present.  Cardiovascular: Normal rate, regular rhythm and normal heart sounds.  Exam reveals no gallop and no friction rub.   No murmur heard. Diminished dorsalis pedis pulses bilaterally and diminished posterior tibial pulses bilaterally.  Pulmonary/Chest: Effort normal and breath sounds normal. No respiratory distress. She has no wheezes. She has no rales. She exhibits no tenderness.  Abdominal: Soft. Bowel sounds are normal. She exhibits no distension and no mass. There is no tenderness.  Musculoskeletal: She exhibits tenderness. She exhibits no edema.  Pain in multiple joints  Lymphadenopathy:    She has no cervical adenopathy.  Neurological: She is alert and oriented to person, place, and time. She has normal reflexes. No cranial nerve deficit. Coordination normal.  Diminished sensation to vibration and monofilament testing  Skin: Skin is warm and dry. No rash noted. No erythema. No pallor.  Resolution of prior rash on the left chest wall.  Psychiatric: She has a normal mood and affect. Her behavior is normal. Judgment normal.    Labs reviewed: Basic Metabolic Panel:  Recent Labs  11/10/15 1012 11/18/15 1055 02/06/16 0831  NA 139 141 137  K 5.0 4.7 5.3*  CL 104 102 100  CO2 25 30 22   GLUCOSE 91 76 111*  BUN 28* 35* 32*  CREATININE 1.31* 1.57* 1.81*  CALCIUM 9.0 9.7 9.2   Liver Function Tests:  Recent Labs  05/06/15 0828 02/06/16 0831  AST 16 19  ALT 10 9  ALKPHOS 60 53  BILITOT 0.6 0.4  PROT  6.4 6.6  ALBUMIN 3.9 4.1   No results for input(s): LIPASE, AMYLASE in the last 8760 hours. No results for input(s): AMMONIA in the last 8760 hours. CBC: No results for input(s): WBC, NEUTROABS, HGB, HCT, MCV, PLT in the last 8760 hours. Lipid Panel:  Recent Labs  05/06/15 0828 09/27/15 0822 02/06/16 0831  CHOL 184 170 159  HDL 48 57 61  LDLCALC 119* 102* 88  TRIG 83 55 50  CHOLHDL 3.8 3.0 2.6   Lab Results  Component Value Date   HGBA1C 6.5* 02/06/2016    Procedures since last visit: Ct Angio Chest Aorta W/cm &/or Wo/cm  01/19/2016  CLINICAL DATA:  Thoraco abdominal aortic aneurysm, without rupture. EXAM: CT ANGIOGRAPHY CHEST, ABDOMEN AND PELVIS TECHNIQUE: Multidetector CT imaging through the chest, abdomen and pelvis was performed using the standard protocol during bolus administration of intravenous contrast. Multiplanar reconstructed images and MIPs were obtained and reviewed to evaluate the vascular anatomy. CONTRAST:  60 mL of Isovue 370 intravenously. COMPARISON:  CT scan of January 13, 2015. FINDINGS: CTA CHEST FINDINGS No pneumothorax or pleural effusion is noted. No acute pulmonary disease is noted. There is no evidence of thoracic aortic dissection. Status post coronary artery bypass graft. Right innominate and left common carotid arteries share a common origin without significant stenosis. Moderate focal stenosis is noted at the origin of the left subclavian artery. Ascending thoracic aorta has normal diameter of 2.9 cm.  Transverse thoracic aorta has normal diameter of 2.8 cm. Aneurysmal dilatation of the descending thoracic aorta is noted with irregular atheromatous disease, measuring 4.1 cm in diameter. This is not significantly changed compared to prior exam. No significant osseous abnormality is noted. No mediastinal mass or adenopathy is noted. Review of the MIP images confirms the above findings. CTA ABDOMEN AND PELVIS FINDINGS No gallstones are noted. The liver, spleen and  pancreas are unremarkable. Adrenal glands appear normal. Cortical scarring is seen involving the right kidney, but it is seen to enhance normally. However, there is decreased perfusion of the left kidney, most likely due to significant atheromatous disease and stenosis involving the proximal left renal artery. Severe atheromatous disease is noted throughout the abdominal aorta. 3.8 cm proximal abdominal aortic aneurysm is noted which is extension of previously described descending thoracic aortic aneurysm. Severe stenosis is noted involving the origins of the celiac, and superior mesenteric arteries. Severe stenoses are also noted involving both renal arteries. Aneurysm extends to the aortic bifurcation. Iliac vessels are patent and non aneurysmal the significant atheromatous disease and stenoses are present. Urinary bladder appears normal. There is no evidence of bowel obstruction. Status post hysterectomy. No abnormal fluid collection is noted. Severe degenerative disc disease is noted at L5-S1. Review of the MIP images confirms the above findings. IMPRESSION: 4.1 cm aneurysm of descending thoracic aorta is noted with significant atheromatous disease present. This is not significantly changed compared to prior exam. The ascending thoracic aorta and transverse aortic arch are normal in caliber. Moderate focal stenosis is noted at the origin the left subclavian artery. 3.8 cm proximal abdominal aortic aneurysm is noted with significant atheromatous disease present. Severe stenoses are seen involving the origins of the celiac and superior mesenteric arteries, as well as the right renal artery. Normal enhancement of the right kidney is noted. However, there is significantly delayed enhancement of the left kidney, presumably due to severe atheromatous disease involving the origin of the left renal artery. These results will be called to the ordering clinician or representative by the Radiologist Assistant, and  communication documented in the PACS or zVision Dashboard. Electronically Signed   By: Marijo Conception, M.D.   On: 01/19/2016 10:55   Ct Angio Abd/pel W/ And/or W/o  01/19/2016  CLINICAL DATA:  Thoraco abdominal aortic aneurysm, without rupture. EXAM: CT ANGIOGRAPHY CHEST, ABDOMEN AND PELVIS TECHNIQUE: Multidetector CT imaging through the chest, abdomen and pelvis was performed using the standard protocol during bolus administration of intravenous contrast. Multiplanar reconstructed images and MIPs were obtained and reviewed to evaluate the vascular anatomy. CONTRAST:  60 mL of Isovue 370 intravenously. COMPARISON:  CT scan of January 13, 2015. FINDINGS: CTA CHEST FINDINGS No pneumothorax or pleural effusion is noted. No acute pulmonary disease is noted. There is no evidence of thoracic aortic dissection. Status post coronary artery bypass graft. Right innominate and left common carotid arteries share a common origin without significant stenosis. Moderate focal stenosis is noted at the origin of the left subclavian artery. Ascending thoracic aorta has normal diameter of 2.9 cm. Transverse thoracic aorta has normal diameter of 2.8 cm. Aneurysmal dilatation of the descending thoracic aorta is noted with irregular atheromatous disease, measuring 4.1 cm in diameter. This is not significantly changed compared to prior exam. No significant osseous abnormality is noted. No mediastinal mass or adenopathy is noted. Review of the MIP images confirms the above findings. CTA ABDOMEN AND PELVIS FINDINGS No gallstones are noted. The liver, spleen and pancreas are unremarkable.  Adrenal glands appear normal. Cortical scarring is seen involving the right kidney, but it is seen to enhance normally. However, there is decreased perfusion of the left kidney, most likely due to significant atheromatous disease and stenosis involving the proximal left renal artery. Severe atheromatous disease is noted throughout the abdominal aorta. 3.8  cm proximal abdominal aortic aneurysm is noted which is extension of previously described descending thoracic aortic aneurysm. Severe stenosis is noted involving the origins of the celiac, and superior mesenteric arteries. Severe stenoses are also noted involving both renal arteries. Aneurysm extends to the aortic bifurcation. Iliac vessels are patent and non aneurysmal the significant atheromatous disease and stenoses are present. Urinary bladder appears normal. There is no evidence of bowel obstruction. Status post hysterectomy. No abnormal fluid collection is noted. Severe degenerative disc disease is noted at L5-S1. Review of the MIP images confirms the above findings. IMPRESSION: 4.1 cm aneurysm of descending thoracic aorta is noted with significant atheromatous disease present. This is not significantly changed compared to prior exam. The ascending thoracic aorta and transverse aortic arch are normal in caliber. Moderate focal stenosis is noted at the origin the left subclavian artery. 3.8 cm proximal abdominal aortic aneurysm is noted with significant atheromatous disease present. Severe stenoses are seen involving the origins of the celiac and superior mesenteric arteries, as well as the right renal artery. Normal enhancement of the right kidney is noted. However, there is significantly delayed enhancement of the left kidney, presumably due to severe atheromatous disease involving the origin of the left renal artery. These results will be called to the ordering clinician or representative by the Radiologist Assistant, and communication documented in the PACS or zVision Dashboard. Electronically Signed   By: Marijo Conception, M.D.   On: 01/19/2016 10:55    Assessment/Plan 1. Type 2 diabetes mellitus with stage 2 chronic kidney disease, without long-term current use of insulin (HCC) - Hemoglobin A1c; Future - Basic metabolic panel; Future - Microalbumin, urine; Future  2. HLD (hyperlipidemia) - Lipid  panel; Future  3. Essential hypertension - Basic metabolic panel; Future  4. Coronary artery disease involving native coronary artery of native heart without angina pectoris occ chest pain  5. Atrial fibrillation, unspecified type ([place]) Stable. NSR [place].

## 2016-02-09 ENCOUNTER — Ambulatory Visit (INDEPENDENT_AMBULATORY_CARE_PROVIDER_SITE_OTHER): Payer: Medicare Other | Admitting: Vascular Surgery

## 2016-02-09 ENCOUNTER — Encounter: Payer: Self-pay | Admitting: Vascular Surgery

## 2016-02-09 VITALS — BP 157/55 | HR 63 | Temp 98.8°F | Resp 14 | Ht 62.0 in | Wt 153.0 lb

## 2016-02-09 DIAGNOSIS — I716 Thoracoabdominal aortic aneurysm, without rupture, unspecified: Secondary | ICD-10-CM

## 2016-02-09 DIAGNOSIS — I6523 Occlusion and stenosis of bilateral carotid arteries: Secondary | ICD-10-CM

## 2016-02-09 NOTE — Progress Notes (Signed)
[facility] [place] HISTORY AND PHYSICAL    History of Present Illness:  Patient is a 75 y.o. 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   His atherosclerotic risk factors remain afib, diabetes, elevated cholesterol, smoking, and coronary artery disease.  These are all currently stable and followed by her primary care physician.  She has had poor control of her blood pressure chronically with it being low sometimes in the mornings and then very high in the evenings. This has persisted for several years.  She denies any new neurologic events including amaurosis, numbness, or weakness. Previously had a right carotid endarterectomy by Dr. Amedeo Plenty in 2001. She also had a previous left carotid endarterectomy and redo carotid endarterectomy in 2001. Initial carotid was done by Dr. Amedeo Plenty the redo with Dr. Donnetta Hutching.    She is sent for followup today for evaluation of a thoracoabdominal aneurysm.    Past Medical History     Diagnosis    Date     .    Arthritis         .    Atrial fibrillation    02/21/2011     .    Hypertension         .    Hyperlipidemia         .    Myocardial infarction         .    Diabetes mellitus    age 67     .    CAD (coronary artery disease)                 prior coronary stenting     .    Stroke    2001     .    Peripheral vascular disease         .    Carotid artery disease    2001             s/p Bilateral CEA, after CVA     .    TR (tricuspid regurgitation), severe 12/02/12 for repair with CABG    12/03/2012     .    Pulmonary HTN    12/03/2012     .    Dyspnea    03/13/2006             Myoview - EF 76%; mild ischemia in apical lateral region, less severe than previous study in 2002     .    H/O endarterectomy    09/11/2012             patent L and R carotid sites, R 60-79% restenosed ICA; L <40% restenosed ICA     .    Stenosis of artery    06/03/2012             doppler- most distal aspect of abd aorta no aneurysmal dilation; bilateral  ABIs - mild L arterial insufficiency, L CIA narrowing w/ 50-69% diameter reduction; L and R SFA both w/ 0-49% diameter reductions     .    H/O endarterectomy    01/08/2012             doppler - R distal common carotid/proximal ICA stenosed 80-99%, L 0-39%     .    Decreased pedal pulses    06/04/2011             doppler - bilateral ABIs no evidence of insufficiency; L CIA slightly elevated velocities 20-30% diameter  reduction;      .    Limb pain    05/02/2011             doppler - no evidence of thrombus of thrombophlebitis     .    Diabetes         .    H/O carotid endarterectomy    03/06/2012             CT angio -high grade stenosis of proximal R internal carotid w/ lg posterior ulceration or dissection flap; near occlusive stenosis at origin of nondominantproximal L vertebral artery; <50% stenosis of proximal R vertebral and proximal L subclavian artery     .    Urinary tract infection, site not specified         .    Tobacco use disorder         .    Unspecified cataract         .    Myalgia and myositis, unspecified         .    Cervicalgia         .    Acute upper respiratory infections of unspecified site         .    Tobacco use disorder         .    Peripheral vascular disease, unspecified         .    Atrial fibrillation         .    Phlebitis and thrombophlebitis of superficial vessels of lower extremities         .    Edema         .    Long term (current) use of anticoagulants         .    Loss of weight         .    Abdominal pain, epigastric         .    Atrial fibrillation         .    Benign paroxysmal positional vertigo         .    Type II or unspecified type diabetes mellitus with peripheral circulatory disorders, uncontrolled(250.72)         .    Unspecified vitamin D deficiency         .    Other manifestations of vitamin A deficiency         .    Type II or unspecified type diabetes mellitus with peripheral circulatory disorders, not stated as uncontrolled(250.70)          .    Primary localized osteoarthrosis, hand         .    Abdominal pain, epigastric         .    Nontoxic uninodular goiter         .    Nonspecific abnormal results of liver function study         .    Candidiasis of vulva and vagina         .    Routine gynecological examination         .    Unspecified disorder of iris and ciliary body         .    Spinal stenosis, unspecified region other than cervical         .    Other malaise and fatigue         .  Pain in joint, pelvic region and thigh         .    Cervicalgia         .    Other and unspecified hyperlipidemia         .    Edema         .    Coronary atherosclerosis of unspecified type of vessel, native or graft         .    Pain in limb         .    Osteoarthrosis, unspecified whether generalized or localized, unspecified site         .    Occlusion and stenosis of carotid artery without mention of cerebral infarction         .    Lumbago         .    Myalgia and myositis, unspecified         .    Renal artery stenosis             Past Surgical History     Procedure    Laterality    Date     .    Carotid endarterectomy        2001             Bilateral     .    Cholecystectomy             .    Kidney stone surgery                     Removal     .    Back surgery        [phone]             three previous surgeries     .    Angioplasty        1984 and 1982     .    Abdominal hysterectomy        1975     .    Percutaneous coronary stent intervention (pci-s)        2002             2 BMS in ostial/Prox & mid RCA (mid 2.5 mm 20x  mm, & 2.75 mm x  8 mm ostial)     .    Coronary artery bypass graft        12/04/2012             Procedure: CORONARY ARTERY BYPASS GRAFTING (CABG);  Surgeon: Ivin Poot, MD;  Location: [facility];  Service: Open Heart Surgery;  Laterality: N/A;     .    Intraoperative transesophageal echocardiogram        12/04/2012             Procedure: INTRAOPERATIVE TRANSESOPHAGEAL ECHOCARDIOGRAM;   Surgeon: Ivin Poot, MD;  Location: [facility];  Service: Open Heart Surgery;  Laterality: N/A;     .    Appendectomy        1954     .    Tee with cardioversion        01/23/2011             EF 99991111; grade 2 diastolic dysfunction, elevated mean LA filling pressure, RV systolic pressure increased consistent w/ mod pulmonary hypertension     .    Arch aortogram  03/30/2011             40% recurrent stenosis at origin of R proximal carotid patch, shelf like recurrent stenosis w/in midsection of patch that does not appear to be flow limiting; normal non-stenosed great vessel origins       Social History History     Substance Use Topics     .    Smoking status:    Former Smoker -- 0.30 packs/day for 57 years             Types:    Cigarettes             Quit date:    11/19/2012     .    Smokeless tobacco:    Never Used                 Comment: pt states she is working on it but husband just passed     .    Alcohol Use:    0.6 oz/week             1 Glasses of wine per week       Family History Family History     Problem    Relation    Age of Onset     .    Other    Mother                 CVA     .    Diabetes    Mother         .    Heart disease    Mother                 Heart disease before age 40     .    Heart attack    Mother         .    Heart disease    Father                 Heart Disease before age 45     .    Heart attack    Father         .    Heart disease    Sister                 Amputation     .    Diabetes    Sister         .    Heart disease    Brother                 Heart disease before age 25     .    Diabetes    Brother         .    Stroke    Brother         .    Stroke    Brother         .    Heart disease    Brother         .    Stroke    Brother         .    Heart disease    Brother         .    Heart disease    Sister         .    Heart disease    Sister         .  Diabetes    Other         .    Cancer    Other                 breast     .     Heart disease    Other                 cad     .    Other    Other                 cva     .    Diabetes    Daughter         .    Cancer    Sister           Allergies    Allergies     Allergen    Reactions     .    Iron    Hives     .    Norvasc [Amlodipine Besylate]    Nausea And Vomiting     .    Promethazine    Other (See Comments)             hallucinations     .    Vicodin [Hydrocodone-Acetaminophen]    Other (See Comments)             Caused arrythmia      .    Vioxx [Rofecoxib]    Nausea And Vomiting      Current Outpatient Prescriptions on File Prior to Visit  Medication Sig Dispense Refill  . acetaminophen-codeine (TYLENOL #3) 300-30 MG tablet Take 1 tablet by mouth every 6 (six) hours as needed for moderate pain. 30 tablet 0  . aspirin 81 MG tablet Take 81 mg by mouth daily.     . carvedilol (COREG) 25 MG tablet Take 25 mg by mouth 2 (two) times daily with a meal.    . cloNIDine (CATAPRES) 0.1 MG tablet Take 0.1 mg by mouth as needed (SYSTOLIC BP > [id number]).    . furosemide (LASIX) 40 MG tablet Take 1 tablet (40 mg total) by mouth daily. 30 tablet 0  . latanoprost (XALATAN) 0.005 % ophthalmic solution Place 1 drop into both eyes at bedtime.    [name] LUMIGAN 0.01 % SOLN     . meloxicam (MOBIC) 7.5 MG tablet Take 7.5 mg by mouth daily. FOR ARTHRITIS    . metFORMIN (GLUCOPHAGE) 1000 MG tablet Take 1 tablet (1,000 mg total) by mouth 2 (two) times daily with a meal. 180 tablet 3  . nitroGLYCERIN (NITROSTAT) 0.4 MG SL tablet Place 0.4 mg under the tongue every 5 (five) minutes as needed for chest pain.    . potassium chloride SA (K-DUR,KLOR-CON) 20 MEQ tablet Take 1 tablet (20 mEq total) by mouth daily. 30 tablet 0  . rosuvastatin (CRESTOR) 40 MG tablet Take 40 mg by mouth daily.    . valsartan-hydrochlorothiazide (DIOVAN-HCT) 320-25 MG tablet TAKE ONE-HALF (1/2) TABLET TWICE A DAY FOR BLOOD PRESSURE 90 tablet 1   No current facility-administered medications on file prior to visit.     ROS:    General:  No weight loss, Fever, chills  HEENT: No recent headaches, no nasal bleeding, no visual changes, no sore throat  Neurologic: No dizziness, blackouts, seizures. No recent symptoms of stroke or mini- stroke. No recent episodes of slurred speech, or temporary blindness.  Cardiac: + recent episodes of chest pain/pressure, no  shortness of breath at rest.  No shortness of breath with exertion.  +history of atrial fibrillation or irregular heartbeat  Vascular: No history of rest pain in feet.  No history of claudication.  No history of non-healing ulcer, No history of DVT    Pulmonary: No home oxygen, no productive cough, no hemoptysis,  No asthma or wheezing  Musculoskeletal:  [ ]  Arthritis, [ ]  Low back pain,  [ ]  Joint pain  Hematologic:No history of hypercoagulable state.  No history of easy bleeding.  No history of anemia  Gastrointestinal: No hematochezia or melena,  No gastroesophageal reflux, no trouble swallowing  Urinary: [ ]  chronic Kidney disease, [ ]  on HD - [ ]  MWF or [ ]  TTHS, [ ]  Burning with urination, [ ]  Frequent urination, [ ]  Difficulty urinating;    Skin: No rashes  Psychological: No history of anxiety,  No history of depression   Physical Examination    Filed Vitals:   02/09/16 1027  BP: 157/55  Pulse: 63  Temp: 98.8 F (37.1 C)  TempSrc: Oral  Resp: 14  Height: 5\' 2"  (1.575 m)  Weight: 153 lb (69.4 kg)  SpO2: 98%    General:  Alert and oriented, no acute distress HEENT: Normal Neck: Left-sided carotid bruit (previously history of bilateral bruits on recent office visit) Pulmonary: Clear to auscultation bilaterally Cardiac: Regular Rate and Rhythm without 2/6 diastolic murmur Abdomen: Soft, non-tender, non-distended, no mass Skin: No rash Extremity Pulses:  2+ radial, brachial, femoral, dorsalis pedis pulses bilaterally Musculoskeletal: No deformity or edema     Neurologic: Upper and lower extremity motor 5/5 and  symmetric  DATA:  CT angiogram from today images are reviewed.  This is compared to a CT scan from 6 months ago.[name] Descending thoracic and upper abdominal aorta was 4 cm which is similar to 6 months ago. The abdominal component is 4 cm in diameter which is also unchanged.   ASSESSMENT: Type II thoracoabdominal aneurysm no significant change in the last 6 months. We would consider repair of this at 6 cm or greater.Patient also with previous carotid endarterectomy and redo carotid endarterectomy. Currently asymptomatic no symptoms of TIA amaurosis or stroke  PLAN:  #1 repeat carotid duplex one year #2 repeat CT scan of the chest abdomen and pelvis in 2 years. #3 hypertension patient will continue to work on this with her primary care physician.  Ruta Hinds, MD [facility] [place] Office: [phone] Pager: [phone]

## 2016-02-27 ENCOUNTER — Encounter: Payer: Self-pay | Admitting: Cardiovascular Disease

## 2016-02-27 ENCOUNTER — Ambulatory Visit (INDEPENDENT_AMBULATORY_CARE_PROVIDER_SITE_OTHER): Payer: Medicare Other | Admitting: Cardiovascular Disease

## 2016-02-27 VITALS — BP 128/88 | HR 63 | Ht 62.0 in | Wt 153.0 lb

## 2016-02-27 DIAGNOSIS — I6523 Occlusion and stenosis of bilateral carotid arteries: Secondary | ICD-10-CM

## 2016-02-27 DIAGNOSIS — I701 Atherosclerosis of renal artery: Secondary | ICD-10-CM

## 2016-02-27 DIAGNOSIS — I716 Thoracoabdominal aortic aneurysm, without rupture, unspecified: Secondary | ICD-10-CM

## 2016-02-27 DIAGNOSIS — N183 Chronic kidney disease, stage 3 unspecified: Secondary | ICD-10-CM

## 2016-02-27 DIAGNOSIS — I10 Essential (primary) hypertension: Secondary | ICD-10-CM

## 2016-02-27 DIAGNOSIS — I272 Other secondary pulmonary hypertension: Secondary | ICD-10-CM

## 2016-02-27 DIAGNOSIS — I739 Peripheral vascular disease, unspecified: Secondary | ICD-10-CM

## 2016-02-27 DIAGNOSIS — I480 Paroxysmal atrial fibrillation: Secondary | ICD-10-CM

## 2016-02-27 DIAGNOSIS — E785 Hyperlipidemia, unspecified: Secondary | ICD-10-CM

## 2016-02-27 DIAGNOSIS — I2510 Atherosclerotic heart disease of native coronary artery without angina pectoris: Secondary | ICD-10-CM | POA: Diagnosis not present

## 2016-02-27 DIAGNOSIS — I5032 Chronic diastolic (congestive) heart failure: Secondary | ICD-10-CM

## 2016-02-27 DIAGNOSIS — I2721 Secondary pulmonary arterial hypertension: Secondary | ICD-10-CM

## 2016-02-27 MED ORDER — FUROSEMIDE 40 MG PO TABS: 40.0000 mg | ORAL_TABLET | Freq: Every day | ORAL | Status: DC

## 2016-02-27 NOTE — Progress Notes (Signed)
Patient ID: Kendra Todd, female   DOB: Sep 25, 1941, 75 y.o.   MRN: [id number]    Cardiology Office Note    Date:  02/27/2016   ID:  Kendra Todd, Kendra Todd Dec 28, 1940, MRN [id number]  PCP:  Estill Dooms, MD  Cardiologist:   [name], MD   Chief Complaint  Patient presents with  . 3 MONTHS  . Edema    History of Present Illness:  Kendra Todd is a 75 y.o. female with CAD status post CABG, hypertensive heart disease with diastolic HF, severe hypertension, thoracoabdominal aortic aneurysm, recurrent carotid artery disease, hyperlipidemia,  remoteparoxysmal atrial fibrillation and pulmonary artery hypertension.  She has not had recent CV complaints. Blood pressure control has recently been better, but remains a little volatile. Sometimes when she wakes up in the mornings her blood pressure will be around 90/50 and she feels a little weak. She will delay take her morning medications until later in the day, but never skips the medications altogether. Sometimes in the afternoon her blood pressure is high but the systolic blood pressure has only exceeded 150 about 3 times over the last month. In fact the highest systolic blood pressure she has seen in the last month was 152 mmHg.  Denies angina or dyspnea on exertion. She does not have intermittent claudication. Recently performed ankle brachial indices showed 1.05 on the right 0.9 on the left. Denies syncope, palpitations, leg edema, new focal neurological events or bleeding problems.  She has decided she does not want to go right to left heart catheterization.  Kendra Todd has extensive coronary and peripheral arterial disease. She underwent coronary bypass surgery January 2014, after having previously undergone stents to the right coronary artery in 2002 and several angioplasty procedures before that. She has preserved left ventricular systolic function. Despite echo evidence of diastolic dysfunction, congestive heart failure has not been a  problem.  She has undergone bilateral carotid endarterectomies and has recurrent stenoses. Of her surgeries was complicated by a dissection flap and a stroke, from which she has recovered. She has a moderate size type 2 thoracoabdominal aortic aneurysm (recent reimaging with Dr. Oneida Alar shows a stable diameter of 4 cm).   She has previously undergone TEE guided cardioversion for paroxysmal atrial fibrillation (2012) Had postoperative atrial fibrillation after bypass surgery. Anticoagulants were stopped due to gastrointestinal bleeding that required transfusion. She takes aspirin. She was poorly tolerant to amiodarone (nausea, anorexia, 20 lb weight loss).  She has severe systemic hypertension and it has taken a long time to find a regimen that controls it. He finally seemed to have settled on a combination of high-dose carvedilol, Clonidine, valsartan/hydrochlorothiazide. She has moderate bilateral renal artery stenosis at angiography January 2015, not felt to be severe enough to cause secondary hypertension.  Echocardiogram performed in January 2017 showed severe pulmonary artery hypertension (estimated systolic PA pressure 73 mmHg) in the setting of pseudo-normal mitral inflow consistent with grade 2 diastolic dysfunction/elevated left atrial filling pressures. Moderate mitral regurgitation was also present  Past Medical History  Diagnosis Date  . Arthritis   . Atrial fibrillation ([place]) 02/21/2011  . Hypertension   . Hyperlipidemia   . Myocardial infarction ([place])   . Diabetes mellitus age 71  . CAD (coronary artery disease)     prior coronary stenting  . Stroke ([place]) 2001  . Peripheral vascular disease ([place])   . Carotid artery disease ([place]) 2001    s/p Bilateral CEA, after CVA  . TR (tricuspid regurgitation), severe 12/02/12 for  repair with CABG 12/03/2012  . Pulmonary HTN ([place]) 12/03/2012  . Dyspnea 03/13/2006    Myoview - EF 76%; mild ischemia in apical lateral region, less severe than  previous study in 2002  . H/O endarterectomy 09/11/2012    patent L and R carotid sites, R 60-79% restenosed ICA; L <40% restenosed ICA  . Stenosis of artery ([place]) 06/03/2012    doppler- most distal aspect of abd aorta no aneurysmal dilation; bilateral ABIs - mild L arterial insufficiency, L CIA narrowing w/ 50-69% diameter reduction; L and R SFA both w/ 0-49% diameter reductions  . H/O endarterectomy 01/08/2012    doppler - R distal common carotid/proximal ICA stenosed 80-99%, L 0-39%  . Decreased pedal pulses 06/04/2011    doppler - bilateral ABIs no evidence of insufficiency; L CIA slightly elevated velocities 20-30% diameter reduction;   . Limb pain 05/02/2011    doppler - no evidence of thrombus of thrombophlebitis  . Diabetes ([place])   . H/O carotid endarterectomy 03/06/2012    CT angio -high grade stenosis of proximal R internal carotid w/ lg posterior ulceration or dissection flap; near occlusive stenosis at origin of nondominantproximal L vertebral artery; <50% stenosis of proximal R vertebral and proximal L subclavian artery  . Urinary tract infection, site not specified   . Tobacco use disorder   . Unspecified cataract   . Myalgia and myositis, unspecified   . Cervicalgia   . Acute upper respiratory infections of unspecified site   . Tobacco use disorder   . Peripheral vascular disease, unspecified ([place])   . Atrial fibrillation ([place])   . Phlebitis and thrombophlebitis of superficial vessels of lower extremities   . Edema   . Long term (current) use of anticoagulants   . Loss of weight   . Abdominal pain, epigastric   . Atrial fibrillation ([place])   . Benign paroxysmal positional vertigo   . Type II or unspecified type diabetes mellitus with peripheral circulatory disorders, uncontrolled(250.72)   . Unspecified vitamin D deficiency   . Other manifestations of vitamin A deficiency   . Type II or unspecified type diabetes mellitus with peripheral circulatory disorders, not stated as  uncontrolled(250.70)   . Primary localized osteoarthrosis, hand   . Abdominal pain, epigastric   . Nontoxic uninodular goiter   . Nonspecific abnormal results of liver function study   . Candidiasis of vulva and vagina   . Routine gynecological examination   . Unspecified disorder of iris and ciliary body   . Spinal stenosis, unspecified region other than cervical   . Other malaise and fatigue   . Pain in joint, pelvic region and thigh   . Cervicalgia   . Other and unspecified hyperlipidemia   . Edema   . Coronary atherosclerosis of unspecified type of vessel, native or graft   . Pain in limb   . Osteoarthrosis, unspecified whether generalized or localized, unspecified site   . Occlusion and stenosis of carotid artery without mention of cerebral infarction   . Lumbago   . Myalgia and myositis, unspecified   . Renal artery stenosis ([place])   . HTN (hypertension) 11/27/2012  . Superficial vein thrombosis 05/11/2015    Left forehead    Past Surgical History  Procedure Laterality Date  . Carotid endarterectomy  2001    Bilateral  . Kidney stone surgery      Removal  . Back surgery  [phone]    three previous surgeries  . Angioplasty  1984 and 1982  . Abdominal  hysterectomy  1975  . Percutaneous coronary stent intervention (pci-s)  2002    2 BMS in ostial/Prox & mid RCA (mid 2.5 mm 20x  mm, & 2.75 mm x  8 mm ostial)  . Coronary artery bypass graft  12/04/2012    Procedure: CORONARY ARTERY BYPASS GRAFTING (CABG);  Surgeon: Ivin Poot, MD;  Location: [facility];  Service: Open Heart Surgery;  Laterality: N/A;  . Intraoperative transesophageal echocardiogram  12/04/2012    Procedure: INTRAOPERATIVE TRANSESOPHAGEAL ECHOCARDIOGRAM;  Surgeon: Ivin Poot, MD;  Location: [facility];  Service: Open Heart Surgery;  Laterality: N/A;  . Appendectomy  1954  . Tee with cardioversion  01/23/2011    EF 99991111; grade 2 diastolic dysfunction, elevated mean LA filling pressure, RV systolic pressure  increased consistent w/ mod pulmonary hypertension  . Arch aortogram  03/30/2011    40% recurrent stenosis at origin of R proximal carotid patch, shelf like recurrent stenosis w/in midsection of patch that does not appear to be flow limiting; normal non-stenosed great vessel origins  . Left heart catheterization with coronary angiogram N/A 11/28/2012    Procedure: LEFT HEART CATHETERIZATION WITH CORONARY ANGIOGRAM;  Surgeon: Leonie Man, MD;  [facility] C[facility] LAB;  Service: Cardiovascular;  Laterality: N/A;  . Right heart catheterization N/A 12/02/2012    Procedure: RIGHT HEART CATH;  Surgeon: Troy Sine, MD;  Location: [facility] C[facility] LAB;  Service: Cardiovascular;  Laterality: N/A;  . Renal angiogram N/A 11/30/2013    Procedure: RENAL ANGIOGRAM;  Surgeon: Lorretta Harp, MD;  Location: [facility] CATH LAB;  Service: Cardiovascular;  Laterality: N/A;    Current Medications: Outpatient Prescriptions Prior to Visit  Medication Sig Dispense Refill  . acetaminophen-codeine (TYLENOL #3) 300-30 MG tablet Take 1 tablet by mouth every 6 (six) hours as needed for moderate pain. 30 tablet 0  . aspirin 81 MG tablet Take 81 mg by mouth daily.     . carvedilol (COREG) 25 MG tablet Take 25 mg by mouth 2 (two) times daily with a meal.    . cloNIDine (CATAPRES) 0.1 MG tablet Take 0.1 mg by mouth as needed (SYSTOLIC BP > [id number]).    [name] LUMIGAN 0.01 % SOLN Place 1 drop into both eyes at bedtime.     . meloxicam (MOBIC) 7.5 MG tablet Take 7.5 mg by mouth daily. FOR ARTHRITIS    . metFORMIN (GLUCOPHAGE) 1000 MG tablet Take 1 tablet (1,000 mg total) by mouth 2 (two) times daily with a meal. 180 tablet 3  . nitroGLYCERIN (NITROSTAT) 0.4 MG SL tablet Place 0.4 mg under the tongue every 5 (five) minutes as needed for chest pain.    . potassium chloride SA (K-DUR,KLOR-CON) 20 MEQ tablet Take 1 tablet (20 mEq total) by mouth daily. 30 tablet 0  . rosuvastatin (CRESTOR) 40 MG tablet Take 40 mg by mouth daily.    .  valsartan-hydrochlorothiazide (DIOVAN-HCT) 320-25 MG tablet TAKE ONE-HALF (1/2) TABLET TWICE A DAY FOR BLOOD PRESSURE 90 tablet 1  . furosemide (LASIX) 40 MG tablet Take 1 tablet (40 mg total) by mouth daily. 30 tablet 0  . latanoprost (XALATAN) 0.005 % ophthalmic solution Place 1 drop into both eyes at bedtime.     No facility-administered medications prior to visit.     Allergies:   Iron; Amiodarone; Norvasc; Promethazine; Vicodin; and Vioxx   Social History   Social History  . Marital Status: Widowed    Spouse Name: N/A  . Number of Children: N/A  . Years  of Education: N/A   Social History Main Topics  . Smoking status: Former Smoker -- 0.30 packs/day for 57 years    Types: Cigarettes    Quit date: 11/19/2012  . Smokeless tobacco: Never Used  . Alcohol Use: 0.6 oz/week    1 Glasses of wine per week  . Drug Use: No  . Sexual Activity: Not Asked   Other Topics Concern  . None   Social History Narrative     Family History:  The patient's family history includes Cancer in her other and sister; Diabetes in her brother, daughter, mother, other, and sister; Heart attack in her father and mother; Heart disease in her brother, brother, brother, father, mother, other, sister, sister, and sister; Other in her mother and other; Stroke in her brother, brother, and brother.   ROS:   Please see the history of present illness.    ROS All other systems reviewed and are negative.   PHYSICAL EXAM:   VS:  BP 128/88 mmHg  Pulse 63  Ht 5\' 2"  (1.575 m)  Wt 69.4 kg (153 lb)  BMI 27.98 kg/m2   GEN: Well nourished, well developed, in no acute distress HEENT: normal Neck: no JVD, bilateral endarterectomy scars and bilateral carotid bruits, or masses Cardiac: RRR; no murmurs, rubs, or gallops,no edema  Respiratory:  clear to auscultation bilaterally, normal work of breathing GI: soft, nontender, nondistended, + BS MS: no deformity or atrophy Skin: warm and dry, no rash Neuro:  Alert and  Oriented x 3, Strength and sensation are intact Psych: euthymic mood, full affect  Wt Readings from Last 3 Encounters:  02/27/16 69.4 kg (153 lb)  02/09/16 69.4 kg (153 lb)  02/08/16 70.308 kg (155 lb)      Studies/Labs Reviewed:   EKG:  EKG is not ordered today.   Recent Labs: 02/06/2016: ALT 9; BUN 32*; Creatinine, Ser 1.81*; Potassium 5.3*; Sodium 137   Lipid Panel    Component Value Date/Time   CHOL 159 02/06/2016 0831   CHOL 160 11/28/2012 0239   TRIG 50 02/06/2016 0831   HDL 61 02/06/2016 0831   HDL 49 11/28/2012 0239   CHOLHDL 2.6 02/06/2016 0831   CHOLHDL 3.3 11/28/2012 0239   VLDL 12 11/28/2012 0239   LDLCALC 88 02/06/2016 0831   LDLCALC 99 11/28/2012 0239    Additional studies/ records that were reviewed today include:  Notes from Dr. Nyoka Cowden, Dr. fields and CT angiogram of the aorta  ASSESSMENT:    1. Coronary artery disease involving native coronary artery of native heart without angina pectoris   2. Bilateral carotid artery stenosis   3. Thoracoabdominal aortic aneurysm, without rupture ([place])   4. Renal artery stenosis ([place])   5. PAD (peripheral artery disease) ([place])   6. HLD (hyperlipidemia)   7. Paroxysmal atrial fibrillation ([facility])   8. Essential hypertension   9. Chronic diastolic heart failure ([place])   10. PAH (pulmonary artery hypertension) ([facility])      PLAN:  In order of problems listed above:  1. CAD S/P CABG - No symptoms of angina pectoris. (1993 PTCA to circumflex. 2002 2XBMS to ostial RCA, S/P CABG x 3 12/04/12) 2. Carotid disease - has known significant restenosis in the right carotid, asymptomatic. Would be a candidate for carotid stent if this requires another revascularization. 3. Thoracoabdominal aortic aneurysm - stable dimensions by CT scans performed 6 months apart 4. RAS - felt to not be severe enough to cause hypertension 5. PAD - ankle-brachial indices actually look  pretty good. She does not report claudication. Doppler study in July  of 2013 showed a 50-69% stenosis in the left common iliac artery there was three-vessel runoff in the calf on the right side there was two-vessel runoff to 2 occlusion of the anterior tibial artery with distal reconstitution. 6. HLP - maximum dose of our most potent statin. Ideally would like to drive LDL to less than 70. She is not interested in PCS K9 inhibitors. Her current LDL cholesterol is roughly 60% less than her baseline. 7. PAF - no symptomatic recurrence in years; anticoagulation stopped due to severe GI bleeding. Note poorly tolerated amiodarone therapy in the past. Embolic risk remains very high, although her only stroke in the past was related to complications of carotid surgery rather than an embolic event. Even discounting her stroke, CHADSVasc score is 18 (age 6, gender, HTN, vascular disease, CHF, DM). If atrial fibrillation is detected again, recommend Watchman device 8. HTN- low her blood pressure remains a little erratic, I think we have achieved the best compromise between symptomatic hypotension and adequate control of hypertension 9. HFPEF -there was very convincing evidence of severe diastolic dysfunction by echocardiography, currently asymptomatic on a relatively low dose of loop diuretic 10. PAH - this is probably secondary to diastolic left ventricular dysfunction. Cardiac catheterization was offered, but after symptomatic improvement she prefers not to have invasive procedures. She is worried about mechanical complications, as had occurred in the past. 11. Mild diabetes mellitus on metformin monotherapy 12. Chronic kidney disease stage III  Medication Adjustments/Labs and Tests Ordered: Current medicines are reviewed at length with the patient today.  Concerns regarding medicines are outlined above.  Medication changes, Labs and Tests ordered today are listed in the Patient Instructions below. Patient Instructions  Your physician recommends that you continue on your current  medications as directed. Please refer to the Current Medication list given to you today.  Dr [name] recommends that you schedule a follow-up appointment in 1 year. You will receive a reminder letter in the mail two months in advance. If you don't receive a letter, please call our office to schedule the follow-up appointment.  If you need a refill on your cardiac medications before your next appointment, please call your pharmacy.    Mikael Spray, MD  02/27/2016 5:46 PM    [facility] [address], [place], [place]  [address] Phone: [phone]; Fax: [phone]

## 2016-02-27 NOTE — Patient Instructions (Signed)
Your physician recommends that you continue on your current medications as directed. Please refer to the Current Medication list given to you today.  Dr Croitoru recommends that you schedule a follow-up appointment in 1 year. You will receive a reminder letter in the mail two months in advance. If you don't receive a letter, please call our office to schedule the follow-up appointment.  If you need a refill on your cardiac medications before your next appointment, please call your pharmacy. 

## 2016-03-05 ENCOUNTER — Other Ambulatory Visit: Payer: Self-pay | Admitting: *Deleted

## 2016-03-05 DIAGNOSIS — I6523 Occlusion and stenosis of bilateral carotid arteries: Secondary | ICD-10-CM

## 2016-03-13 ENCOUNTER — Other Ambulatory Visit: Payer: Self-pay | Admitting: Internal Medicine

## 2016-03-13 ENCOUNTER — Other Ambulatory Visit: Payer: Self-pay | Admitting: *Deleted

## 2016-04-25 ENCOUNTER — Other Ambulatory Visit: Payer: Self-pay | Admitting: Cardiovascular Disease

## 2016-04-25 NOTE — Telephone Encounter (Signed)
Rx(s) sent to pharmacy electronically.  

## 2016-05-30 DIAGNOSIS — M25561 Pain in right knee: Secondary | ICD-10-CM | POA: Diagnosis not present

## 2016-05-30 DIAGNOSIS — M545 Low back pain: Secondary | ICD-10-CM | POA: Diagnosis not present

## 2016-05-30 DIAGNOSIS — M859 Disorder of bone density and structure, unspecified: Secondary | ICD-10-CM | POA: Diagnosis not present

## 2016-05-30 DIAGNOSIS — M159 Polyosteoarthritis, unspecified: Secondary | ICD-10-CM | POA: Diagnosis not present

## 2016-06-06 DIAGNOSIS — M859 Disorder of bone density and structure, unspecified: Secondary | ICD-10-CM | POA: Diagnosis not present

## 2016-06-06 DIAGNOSIS — M545 Low back pain: Secondary | ICD-10-CM | POA: Diagnosis not present

## 2016-06-06 DIAGNOSIS — M25561 Pain in right knee: Secondary | ICD-10-CM | POA: Diagnosis not present

## 2016-06-06 DIAGNOSIS — M159 Polyosteoarthritis, unspecified: Secondary | ICD-10-CM | POA: Diagnosis not present

## 2016-06-11 ENCOUNTER — Other Ambulatory Visit: Payer: Self-pay | Admitting: Physician Assistant

## 2016-06-22 NOTE — Addendum Note (Signed)
Addended by: Moshe Cipro MESHELL A on: 06/22/2016 04:09 PM   Modules accepted: Orders

## 2016-07-06 DIAGNOSIS — E119 Type 2 diabetes mellitus without complications: Secondary | ICD-10-CM | POA: Diagnosis not present

## 2016-07-06 DIAGNOSIS — H35033 Hypertensive retinopathy, bilateral: Secondary | ICD-10-CM | POA: Diagnosis not present

## 2016-07-06 DIAGNOSIS — H2513 Age-related nuclear cataract, bilateral: Secondary | ICD-10-CM | POA: Diagnosis not present

## 2016-07-18 ENCOUNTER — Other Ambulatory Visit: Payer: Self-pay | Admitting: *Deleted

## 2016-07-18 MED ORDER — ROSUVASTATIN CALCIUM 40 MG PO TABS: 40.0000 mg | ORAL_TABLET | Freq: Every day | ORAL | 2 refills | Status: DC

## 2016-07-20 DIAGNOSIS — M25561 Pain in right knee: Secondary | ICD-10-CM | POA: Diagnosis not present

## 2016-07-20 DIAGNOSIS — M545 Low back pain: Secondary | ICD-10-CM | POA: Diagnosis not present

## 2016-07-20 DIAGNOSIS — M859 Disorder of bone density and structure, unspecified: Secondary | ICD-10-CM | POA: Diagnosis not present

## 2016-07-20 DIAGNOSIS — M159 Polyosteoarthritis, unspecified: Secondary | ICD-10-CM | POA: Diagnosis not present

## 2016-07-23 ENCOUNTER — Other Ambulatory Visit: Payer: Self-pay | Admitting: *Deleted

## 2016-07-23 MED ORDER — ROSUVASTATIN CALCIUM 40 MG PO TABS: 40.0000 mg | ORAL_TABLET | Freq: Every day | ORAL | 1 refills | Status: DC

## 2016-07-23 NOTE — Telephone Encounter (Signed)
Patient requested to be sent to pharmacy.  

## 2016-07-29 ENCOUNTER — Other Ambulatory Visit: Payer: Self-pay | Admitting: Cardiovascular Disease

## 2016-07-31 NOTE — Telephone Encounter (Signed)
Rx(s) sent to pharmacy electronically.  

## 2016-08-02 ENCOUNTER — Other Ambulatory Visit: Payer: Self-pay | Admitting: Internal Medicine

## 2016-08-06 ENCOUNTER — Other Ambulatory Visit: Payer: Medicare Other

## 2016-08-06 DIAGNOSIS — I714 Abdominal aortic aneurysm, without rupture, unspecified: Secondary | ICD-10-CM

## 2016-08-06 DIAGNOSIS — I716 Thoracoabdominal aortic aneurysm, without rupture, unspecified: Secondary | ICD-10-CM

## 2016-08-06 DIAGNOSIS — E1122 Type 2 diabetes mellitus with diabetic chronic kidney disease: Secondary | ICD-10-CM

## 2016-08-06 DIAGNOSIS — E785 Hyperlipidemia, unspecified: Secondary | ICD-10-CM | POA: Diagnosis not present

## 2016-08-06 DIAGNOSIS — I6523 Occlusion and stenosis of bilateral carotid arteries: Secondary | ICD-10-CM

## 2016-08-06 DIAGNOSIS — I10 Essential (primary) hypertension: Secondary | ICD-10-CM

## 2016-08-06 DIAGNOSIS — N182 Chronic kidney disease, stage 2 (mild): Secondary | ICD-10-CM | POA: Diagnosis not present

## 2016-08-06 DIAGNOSIS — M25562 Pain in left knee: Secondary | ICD-10-CM | POA: Diagnosis not present

## 2016-08-06 DIAGNOSIS — M25561 Pain in right knee: Secondary | ICD-10-CM | POA: Diagnosis not present

## 2016-08-06 LAB — BASIC METABOLIC PANEL
BUN: 33 mg/dL — ABNORMAL HIGH (ref 7–25)
CALCIUM: 9.3 mg/dL (ref 8.6–10.4)
CHLORIDE: 102 mmol/L (ref 98–110)
CO2: 26 mmol/L (ref 20–31)
Creat: 1.59 mg/dL — ABNORMAL HIGH (ref 0.60–0.93)
GLUCOSE: 115 mg/dL — AB (ref 65–99)
Potassium: 4.9 mmol/L (ref 3.5–5.3)
SODIUM: 136 mmol/L (ref 135–146)

## 2016-08-06 LAB — LIPID PANEL
CHOL/HDL RATIO: 3.9 ratio (ref <=5.0)
Cholesterol: 187 mg/dL (ref 125–200)
HDL: 48 mg/dL (ref >=46)
LDL CALC: 121 mg/dL (ref <130)
TRIGLYCERIDES: 92 mg/dL (ref <150)
VLDL: 18 mg/dL (ref <30)

## 2016-08-07 LAB — MICROALBUMIN, URINE: Microalb, Ur: 4 mg/dL

## 2016-08-07 LAB — HEMOGLOBIN A1C
Hgb A1c MFr Bld: 6.6 % — ABNORMAL HIGH (ref <5.7)
Mean Plasma Glucose: 143 mg/dL

## 2016-08-08 ENCOUNTER — Ambulatory Visit (INDEPENDENT_AMBULATORY_CARE_PROVIDER_SITE_OTHER): Payer: Medicare Other | Admitting: Internal Medicine

## 2016-08-08 ENCOUNTER — Encounter: Payer: Self-pay | Admitting: Internal Medicine

## 2016-08-08 VITALS — BP 122/92 | HR 61 | Temp 97.8°F | Ht 62.0 in | Wt 162.0 lb

## 2016-08-08 DIAGNOSIS — E785 Hyperlipidemia, unspecified: Secondary | ICD-10-CM

## 2016-08-08 DIAGNOSIS — N182 Chronic kidney disease, stage 2 (mild): Secondary | ICD-10-CM | POA: Diagnosis not present

## 2016-08-08 DIAGNOSIS — I701 Atherosclerosis of renal artery: Secondary | ICD-10-CM | POA: Diagnosis not present

## 2016-08-08 DIAGNOSIS — N183 Chronic kidney disease, stage 3 unspecified: Secondary | ICD-10-CM

## 2016-08-08 DIAGNOSIS — I150 Renovascular hypertension: Secondary | ICD-10-CM | POA: Diagnosis not present

## 2016-08-08 DIAGNOSIS — E1122 Type 2 diabetes mellitus with diabetic chronic kidney disease: Secondary | ICD-10-CM

## 2016-08-08 DIAGNOSIS — I2510 Atherosclerotic heart disease of native coronary artery without angina pectoris: Secondary | ICD-10-CM

## 2016-08-08 DIAGNOSIS — I6523 Occlusion and stenosis of bilateral carotid arteries: Secondary | ICD-10-CM

## 2016-08-08 NOTE — Progress Notes (Signed)
Facility  [facility]    Place of Service:   OFFICE    Allergies  Allergen Reactions  . Iron Hives  . Amiodarone Nausea And Vomiting  . Norvasc [Amlodipine Besylate] Nausea And Vomiting  . Promethazine Other (See Comments)    hallucinations  . Vicodin [Hydrocodone-Acetaminophen] Other (See Comments)    Caused arrythmia   . Vioxx [Rofecoxib] Nausea And Vomiting    Chief Complaint  Patient presents with  . Medical Management of Chronic Issues    6 month medication management blood sugar, blood pressure, cholesterol, CAD, review lab    HPI:   Type 2 diabetes mellitus with stage 2 chronic kidney disease, without long-term current use of insulin (HCC) - controlled  Renovascular hypertension - BP is erratic. Sometimes has SBP as high as 200. No associated symptoms.  Hyperlipidemia, unspecified hyperlipidemia type - controlled  Coronary artery disease involving native coronary artery of native heart without angina pectoris - no angina or palpitations  Bilateral carotid artery stenosis - continue to follow Carotid Doppler annually  CKD (chronic kidney disease) stage 3, GFR 30-59 ml/min - stable    Medications: Patient's Medications  New Prescriptions   No medications on file  Previous Medications   ACETAMINOPHEN-CODEINE (TYLENOL #3) 300-30 MG TABLET    TAKE ONE TABLET BY MOUTH EVERY 6 HOURS AS NEEDED FOR  MODERATE  PAIN   ALENDRONATE (FOSAMAX) 70 MG TABLET    Take one tablet daily for bones   ASPIRIN 81 MG TABLET    Take 81 mg by mouth daily.    CARVEDILOL (COREG) 25 MG TABLET    Take 1 tablet (25 mg total) by mouth 2 (two) times daily with a meal.   CLONIDINE (CATAPRES) 0.1 MG TABLET    Take 0.1 mg by mouth as needed (SYSTOLIC BP > 453).   FUROSEMIDE (LASIX) 40 MG TABLET    Take 1 tablet (40 mg total) by mouth daily.   FUROSEMIDE (LASIX) 40 MG TABLET    TAKE ONE TABLET BY MOUTH ONCE DAILY   LUMIGAN 0.01 % SOLN    Place 1 drop into both eyes at bedtime.    MELOXICAM (MOBIC)  7.5 MG TABLET    Take 7.5 mg by mouth daily. FOR ARTHRITIS   METFORMIN (GLUCOPHAGE) 1000 MG TABLET    TAKE 1 TABLET TWICE A DAY WITH MEALS   NITROGLYCERIN (NITROSTAT) 0.4 MG SL TABLET    Place 0.4 mg under the tongue every 5 (five) minutes as needed for chest pain.   POTASSIUM CHLORIDE SA (K-DUR,KLOR-CON) 20 MEQ TABLET    Take 1 tablet (20 mEq total) by mouth daily.   ROSUVASTATIN (CRESTOR) 40 MG TABLET    Take 1 tablet (40 mg total) by mouth daily.   VALSARTAN-HYDROCHLOROTHIAZIDE (DIOVAN-HCT) 320-25 MG TABLET    TAKE ONE-HALF (1/2) TABLET TWICE A DAY FOR BLOOD PRESSURE  Modified Medications   No medications on file  Discontinued Medications   No medications on file    Review of Systems  Constitutional: Negative.   HENT: Negative.   Eyes: Negative.   Respiratory: Negative.   Cardiovascular: Negative for chest pain, palpitations and leg swelling.       Hx of AF; currently in NSR  Endocrine: Negative for polydipsia, polyphagia and polyuria.       History diabetes  Genitourinary: Positive for frequency and urgency. Negative for flank pain.  Musculoskeletal: Positive for arthralgias and back pain.  Skin:       Rash on the left  chest wall resolved  Neurological:       Intermittent numbness, tingking, and discomfort in both feet  Hematological: Negative.   Psychiatric/Behavioral: Negative.     Vitals:   08/08/16 1026  BP: (!) 122/92  Pulse: 61  Temp: 97.8 F (36.6 C)  TempSrc: Oral  SpO2: 99%  Weight: 162 lb (73.5 kg)  Height: '5\' 2"'$  (1.575 m)   Body mass index is 29.63 kg/m. Wt Readings from Last 3 Encounters:  08/08/16 162 lb (73.5 kg)  02/27/16 153 lb (69.4 kg)  02/09/16 153 lb (69.4 kg)      Physical Exam  Constitutional: She is oriented to person, place, and time. She appears well-developed and well-nourished. No distress.  HENT:  Head: Normocephalic.  Nose: Nose normal.  Upper and lower dentures present.  Eyes: Conjunctivae are normal. Pupils are equal, round,  and reactive to light.  Wears prescription lenses. Right cataract.  Neck: Normal range of motion. Neck supple. No JVD present. No tracheal deviation present. No thyromegaly present.  Cardiovascular: Normal rate, regular rhythm and normal heart sounds.  Exam reveals no gallop and no friction rub.   No murmur heard. Diminished dorsalis pedis pulses bilaterally and diminished posterior tibial pulses bilaterally.  Pulmonary/Chest: Effort normal and breath sounds normal. No respiratory distress. She has no wheezes. She has no rales. She exhibits no tenderness.  Abdominal: Soft. Bowel sounds are normal. She exhibits no distension and no mass. There is no tenderness.  Musculoskeletal: She exhibits tenderness. She exhibits no edema.  Pain in multiple joints  Lymphadenopathy:    She has no cervical adenopathy.  Neurological: She is alert and oriented to person, place, and time. She has normal reflexes. No cranial nerve deficit. Coordination normal.  Diminished sensation to vibration and monofilament testing  Skin: Skin is warm and dry. No rash noted. No erythema. No pallor.  Resolution of prior rash on the left chest wall.  Psychiatric: She has a normal mood and affect. Her behavior is normal. Judgment normal.    Labs reviewed: Lab Summary Latest Ref Rng & Units 08/06/2016 02/06/2016 11/18/2015 11/10/2015  Hemoglobin 13.0-17.0 g/dL (None) (None) (None) (None)  Hematocrit 39.0-52.0 % (None) (None) (None) (None)  White count - (None) (None) (None) (None)  Platelet count - (None) (None) (None) (None)  Sodium 135 - 146 mmol/L 136 137 141 139  Potassium 3.5 - 5.3 mmol/L 4.9 5.3(H) 4.7 5.0  Calcium 8.6 - 10.4 mg/dL 9.3 9.2 9.7 9.0  Phosphorus - (None) (None) (None) (None)  Creatinine 0.60 - 0.93 mg/dL 1.59(H) 1.81(H) 1.57(H) 1.31(H)  AST 0 - 40 IU/L (None) 19 (None) (None)  Alk Phos 39 - 117 IU/L (None) 53 (None) (None)  Bilirubin 0.0 - 1.2 mg/dL (None) 0.4 (None) (None)  Glucose 65 - 99 mg/dL 115(H)  111(H) 76 91  Cholesterol 125 - 200 mg/dL 187 (None) (None) (None)  HDL cholesterol >=46 mg/dL 48 61 (None) (None)  Triglycerides <150 mg/dL 92 50 (None) (None)  LDL Direct - (None) (None) (None) (None)  LDL Calc <130 mg/dL 121 88 (None) (None)  Total protein - (None) (None) (None) (None)  Albumin 3.5 - 4.8 g/dL (None) 4.1 (None) (None)  Some recent data might be hidden   Lab Results  Component Value Date   TSH 1.436 11/24/2013   TSH 2.654 11/28/2012   TSH 0.990 01/22/2011   Lab Results  Component Value Date   BUN 33 (H) 08/06/2016   BUN 32 (H) 02/06/2016   BUN 35 (H) 11/18/2015  Lab Results  Component Value Date   HGBA1C 6.6 (H) 08/06/2016   HGBA1C 6.5 (H) 02/06/2016   HGBA1C 6.7 (H) 09/27/2015    Assessment/Plan  1. Type 2 diabetes mellitus with stage 2 chronic kidney disease, without long-term current use of insulin (HCC) - Hemoglobin A1c; Future - Comprehensive metabolic panel; Future  2. Renovascular hypertension - Comprehensive metabolic panel; Future  3. Hyperlipidemia, unspecified hyperlipidemia type - Lipid panel; Future  4. Coronary artery disease involving native coronary artery of native heart without angina pectoris stable  5. Bilateral carotid artery stenosis stable  6. CKD (chronic kidney disease) stage 3, GFR 30-59 ml/min stable

## 2016-09-10 ENCOUNTER — Telehealth: Payer: Self-pay | Admitting: Cardiovascular Disease

## 2016-09-10 NOTE — Telephone Encounter (Signed)
Can also bring her in to see me at 0830 tomorrow

## 2016-09-10 NOTE — Telephone Encounter (Signed)
Offered patient PA appt for today as we have available slots on Hao's schedule. She was unable to make an appointment today.  At patient request, I have scheduled her for PA appt on 11/8 (Wednesday). She will take extra lasix today as advised and I will follow up with her with further instruction.

## 2016-09-10 NOTE — Telephone Encounter (Signed)
Patient agreeable to being seen tomorrow by Dr. Loletha Grayer. Aware to do lasix increase today as instructed and see provider tomorrow for further instruction.

## 2016-09-10 NOTE — Telephone Encounter (Signed)
Pt of Dr. [name] States issue of feeling a little SOB, "wheezing". Attributes to her CHF and "holding fluid". Approximates 10 lb gain in 1 week. Notes decrease in UOP, she's not noted any changes in blood sugar levels, fluid intake, or diet. Drinks "fluids" but "it's not all water" - coffee, sweet tea - perhaps 1 glass of water a day. She limits intake due to fluid restriction.  She is taking 40mg  lasix daily AM. Advised extra 1x lasix today. Aware I will request advice on dose increase as well as recommendations for follow up and labwork.

## 2016-09-10 NOTE — Telephone Encounter (Signed)
Pt c/o Shortness Of Breath: STAT if SOB developed within the last 24 hours or pt is noticeably SOB on the phone  1. Are you currently SOB (can you hear that pt is SOB on the phone)? Yes a little   2. How long have you been experiencing SOB?3-4 days   3. Are you SOB when sitting or when up moving around? Both sitting and worse when she is moving around   4. Are you currently experiencing any other symptoms?Fatigue, wheezing

## 2016-09-11 ENCOUNTER — Ambulatory Visit (INDEPENDENT_AMBULATORY_CARE_PROVIDER_SITE_OTHER): Payer: Medicare Other | Admitting: Cardiovascular Disease

## 2016-09-11 ENCOUNTER — Encounter: Payer: Self-pay | Admitting: Cardiovascular Disease

## 2016-09-11 VITALS — BP 172/86 | HR 66 | Ht 62.5 in | Wt 160.8 lb

## 2016-09-11 DIAGNOSIS — N183 Chronic kidney disease, stage 3 unspecified: Secondary | ICD-10-CM

## 2016-09-11 DIAGNOSIS — Z79899 Other long term (current) drug therapy: Secondary | ICD-10-CM | POA: Diagnosis not present

## 2016-09-11 DIAGNOSIS — E1122 Type 2 diabetes mellitus with diabetic chronic kidney disease: Secondary | ICD-10-CM

## 2016-09-11 DIAGNOSIS — I5033 Acute on chronic diastolic (congestive) heart failure: Secondary | ICD-10-CM

## 2016-09-11 DIAGNOSIS — I701 Atherosclerosis of renal artery: Secondary | ICD-10-CM | POA: Diagnosis not present

## 2016-09-11 DIAGNOSIS — I2510 Atherosclerotic heart disease of native coronary artery without angina pectoris: Secondary | ICD-10-CM | POA: Diagnosis not present

## 2016-09-11 DIAGNOSIS — I272 Pulmonary hypertension, unspecified: Secondary | ICD-10-CM

## 2016-09-11 DIAGNOSIS — I716 Thoracoabdominal aortic aneurysm, without rupture, unspecified: Secondary | ICD-10-CM

## 2016-09-11 DIAGNOSIS — I480 Paroxysmal atrial fibrillation: Secondary | ICD-10-CM

## 2016-09-11 DIAGNOSIS — I6523 Occlusion and stenosis of bilateral carotid arteries: Secondary | ICD-10-CM

## 2016-09-11 DIAGNOSIS — E7800 Pure hypercholesterolemia, unspecified: Secondary | ICD-10-CM

## 2016-09-11 DIAGNOSIS — I739 Peripheral vascular disease, unspecified: Secondary | ICD-10-CM

## 2016-09-11 DIAGNOSIS — I10 Essential (primary) hypertension: Secondary | ICD-10-CM

## 2016-09-11 MED ORDER — MAGNESIUM OXIDE 400 MG PO TABS: 400.0000 mg | ORAL_TABLET | Freq: Every day | ORAL | 11 refills | Status: DC

## 2016-09-11 NOTE — Patient Instructions (Addendum)
Dr [name] has recommended making the following medication changes: 1. CONTINUE Furosemide 60 mg daily  >>Until weight on home scale is 7lbs LESS THAN today's weight - THEN RESUME 1 TABLET DAILY 2. INCREASE Potassium to 1.5 tablets (30 mEq total) daily  >>Until weight on home scale is 7lbs THAN today's weight - THEN RESUME 1 TABLET DAILY 3. START Magnesium Oxide 400 mg - take 1 tablet daily  Your physician recommends that you return for lab work on Thursday.  Your physician recommends that you schedule a follow-up appointment in 2 weeks with a PA or NP.  Dr [name] recommends that you schedule a follow-up appointment in 3-4 months.  If you need a refill on your cardiac medications before your next appointment, please call your pharmacy.

## 2016-09-11 NOTE — Progress Notes (Signed)
Patient ID: Kendra Todd, female   DOB: Dec 19, 1940, 75 y.o.   MRN: [id number]    Cardiology Office Note    Date:  09/11/2016   ID:  Kendra Todd, [name] 1941-02-23, MRN [id number]  PCP:  Jeanmarie Hubert, MD  Cardiologist:   [name], MD   Chief Complaint  Patient presents with  . Shortness of Breath    pt stated she have a lot of fluid and it having problem breathing    History of Present Illness:  Kendra Todd is a 75 y.o. female with CAD status post CABG, hypertensive heart disease with diastolic HF, severe hypertension, thoracoabdominal aortic aneurysm, recurrent carotid artery disease, hyperlipidemia,  Remote paroxysmal atrial fibrillation and pulmonary artery hypertension, chronic kidney disease stage III.  [name] has had worsening exertional dyspnea and even some subtle suggestion of orthopnea recently. We recommended increasing the dose of her loop diuretic and she has lost 3 pounds overnight and feels a little better today. She is still probably about 7 pounds above her "dry weight". She is mildly hypertensive. The cause for her decompensation might be intermittent noncompliance with loop diuretic therapy. She does not like swallowing the large potassium tablets. She therefore sometimes avoids taking both the Lasix and potassium.  She has intermittent ankle swelling, none today. She does not have intermittent claudication. Denies syncope, palpitations, new focal neurological events or bleeding problems.   [name] has extensive coronary and peripheral arterial disease. She underwent coronary bypass surgery January 2014, after having previously undergone stents to the right coronary artery in 2002 and several angioplasty procedures before that. Echocardiogram performed in January 2017 showed severe pulmonary artery hypertension (estimated systolic PA pressure 73 mmHg) in the setting of pseudo-normal mitral inflow consistent with grade 2 diastolic dysfunction/elevated left atrial filling  pressures. Moderate mitral regurgitation was also present  She has undergone bilateral carotid endarterectomies and has recurrent stenoses. Of her surgeries was complicated by a dissection flap and a stroke, from which she has recovered. She has a moderate size type 2 thoracoabdominal aortic aneurysm (recent reimaging with Dr. Oneida Alar shows a stable diameter of 4 cm).   She has previously undergone TEE guided cardioversion for paroxysmal atrial fibrillation (2012) Had postoperative atrial fibrillation after bypass surgery. Anticoagulants were stopped due to gastrointestinal bleeding that required transfusion. She takes aspirin. She was poorly tolerant to amiodarone (nausea, anorexia, 20 lb weight loss).  She has severe systemic hypertension and it has taken a long time to find a regimen that controls it.  Finally seemed to have settled on a combination of high-dose carvedilol, Clonidine, valsartan/hydrochlorothiazide. She has moderate bilateral renal artery stenosis at angiography January 2015, not felt to be severe enough to cause secondary hypertension.   Past Medical History:  Diagnosis Date  . Abdominal pain, epigastric   . Abdominal pain, epigastric   . Acute upper respiratory infections of unspecified site   . Arthritis   . Atrial fibrillation ([place]) 02/21/2011  . Atrial fibrillation ([place])   . Atrial fibrillation ([place])   . Benign paroxysmal positional vertigo   . CAD (coronary artery disease)    prior coronary stenting  . Candidiasis of vulva and vagina   . Carotid artery disease ([place]) 2001   s/p Bilateral CEA, after CVA  . Cervicalgia   . Cervicalgia   . Coronary atherosclerosis of unspecified type of vessel, native or graft   . Decreased pedal pulses 06/04/2011   doppler - bilateral ABIs no evidence of insufficiency; L CIA slightly  elevated velocities 20-30% diameter reduction;   . Diabetes ([place])   . Diabetes mellitus age 69  . Dyspnea 03/13/2006   Myoview - EF 76%; mild ischemia  in apical lateral region, less severe than previous study in 2002  . Edema   . Edema   . H/O carotid endarterectomy 03/06/2012   CT angio -high grade stenosis of proximal R internal carotid w/ lg posterior ulceration or dissection flap; near occlusive stenosis at origin of nondominantproximal L vertebral artery; <50% stenosis of proximal R vertebral and proximal L subclavian artery  . H/O endarterectomy 09/11/2012   patent L and R carotid sites, R 60-79% restenosed ICA; L <40% restenosed ICA  . H/O endarterectomy 01/08/2012   doppler - R distal common carotid/proximal ICA stenosed 80-99%, L 0-39%  . HTN (hypertension) 11/27/2012  . Hyperlipidemia   . Hypertension   . Limb pain 05/02/2011   doppler - no evidence of thrombus of thrombophlebitis  . Long term (current) use of anticoagulants   . Loss of weight   . Lumbago   . Myalgia and myositis, unspecified   . Myalgia and myositis, unspecified   . Myocardial infarction   . Nonspecific abnormal results of liver function study   . Nontoxic uninodular goiter   . Occlusion and stenosis of carotid artery without mention of cerebral infarction   . Osteoarthrosis, unspecified whether generalized or localized, unspecified site   . Other and unspecified hyperlipidemia   . Other malaise and fatigue   . Other manifestations of vitamin A deficiency   . Pain in joint, pelvic region and thigh   . Pain in limb   . Peripheral vascular disease ([place])   . Peripheral vascular disease, unspecified   . Phlebitis and thrombophlebitis of superficial vessels of lower extremities   . Primary localized osteoarthrosis, hand   . Pulmonary HTN 12/03/2012  . Renal artery stenosis ([place])   . Routine gynecological examination   . Spinal stenosis, unspecified region other than cervical   . Stenosis of artery ([place]) 06/03/2012   doppler- most distal aspect of abd aorta no aneurysmal dilation; bilateral ABIs - mild L arterial insufficiency, L CIA narrowing w/ 50-69% diameter  reduction; L and R SFA both w/ 0-49% diameter reductions  . Stroke ([place]) 2001  . Superficial vein thrombosis 05/11/2015   Left forehead  . Tobacco use disorder   . Tobacco use disorder   . TR (tricuspid regurgitation), severe 12/02/12 for repair with CABG 12/03/2012  . Type II or unspecified type diabetes mellitus with peripheral circulatory disorders, not stated as uncontrolled(250.70)   . Type II or unspecified type diabetes mellitus with peripheral circulatory disorders, uncontrolled(250.72)   . Unspecified cataract   . Unspecified disorder of iris and ciliary body   . Unspecified vitamin D deficiency   . Urinary tract infection, site not specified     Past Surgical History:  Procedure Laterality Date  . ABDOMINAL HYSTERECTOMY  1975  . ANGIOPLASTY  1984 and 1982  . APPENDECTOMY  1954  . ARCH AORTOGRAM  03/30/2011   40% recurrent stenosis at origin of R proximal carotid patch, shelf like recurrent stenosis w/in midsection of patch that does not appear to be flow limiting; normal non-stenosed great vessel origins  . BACK SURGERY  [phone]   three previous surgeries  . CAROTID ENDARTERECTOMY  2001   Bilateral  . CORONARY ARTERY BYPASS GRAFT  12/04/2012   Procedure: CORONARY ARTERY BYPASS GRAFTING (CABG);  Surgeon: Ivin Poot, MD;  Location: [facility];  Service: Open Heart Surgery;  Laterality: N/A;  . INTRAOPERATIVE TRANSESOPHAGEAL ECHOCARDIOGRAM  12/04/2012   Procedure: INTRAOPERATIVE TRANSESOPHAGEAL ECHOCARDIOGRAM;  Surgeon: Ivin Poot, MD;  Location: [facility];  Service: Open Heart Surgery;  Laterality: N/A;  . KIDNEY STONE SURGERY     Removal  . LEFT HEART CATHETERIZATION WITH CORONARY ANGIOGRAM N/A 11/28/2012   Procedure: LEFT HEART CATHETERIZATION WITH CORONARY ANGIOGRAM;  Surgeon: Leonie Man, MD;  Location: [facility] CATH LAB;  Service: Cardiovascular;  Laterality: N/A;  . PERCUTANEOUS CORONARY STENT INTERVENTION (PCI-S)  2002   2 BMS in ostial/Prox & mid RCA (mid 2.5 mm 20x   mm, & 2.75 mm x  8 mm ostial)  . RENAL ANGIOGRAM N/A 11/30/2013   Procedure: RENAL ANGIOGRAM;  Surgeon: Lorretta Harp, MD;  Location: [facility] CATH LAB;  Service: Cardiovascular;  Laterality: N/A;  . RIGHT HEART CATHETERIZATION N/A 12/02/2012   Procedure: RIGHT HEART CATH;  Surgeon: Troy Sine, MD;  Location: [facility] CATH LAB;  Service: Cardiovascular;  Laterality: N/A;  . TEE WITH CARDIOVERSION  01/23/2011   EF [id number]; grade 2 diastolic dysfunction, elevated mean LA filling pressure, RV systolic pressure increased consistent w/ mod pulmonary hypertension    Current Medications: Outpatient Medications Prior to Visit  Medication Sig Dispense Refill  . acetaminophen-codeine (TYLENOL #3) 300-30 MG tablet TAKE ONE TABLET BY MOUTH EVERY 6 HOURS AS NEEDED FOR  MODERATE  PAIN 30 tablet 0  . alendronate (FOSAMAX) 70 MG tablet Take one tablet daily for bones    . aspirin 81 MG tablet Take 81 mg by mouth daily.     . carvedilol (COREG) 25 MG tablet Take 1 tablet (25 mg total) by mouth 2 (two) times daily with a meal. 180 tablet 3  . cloNIDine (CATAPRES) 0.1 MG tablet Take 0.1 mg by mouth as needed (SYSTOLIC BP > [id number]).    . furosemide (LASIX) 40 MG tablet Take 1 tablet (40 mg total) by mouth daily. 30 tablet 11  . furosemide (LASIX) 40 MG tablet TAKE ONE TABLET BY MOUTH ONCE DAILY 30 tablet 0  . LUMIGAN 0.01 % SOLN Place 1 drop into both eyes at bedtime.     . meloxicam (MOBIC) 7.5 MG tablet Take 7.5 mg by mouth daily. FOR ARTHRITIS    . metFORMIN (GLUCOPHAGE) 1000 MG tablet TAKE 1 TABLET TWICE A DAY WITH MEALS 180 tablet 1  . nitroGLYCERIN (NITROSTAT) 0.4 MG SL tablet Place 0.4 mg under the tongue every 5 (five) minutes as needed for chest pain.    . potassium chloride SA (K-DUR,KLOR-CON) 20 MEQ tablet Take 1 tablet (20 mEq total) by mouth daily. 30 tablet 0  . rosuvastatin (CRESTOR) 40 MG tablet Take 1 tablet (40 mg total) by mouth daily. 90 tablet 1  . valsartan-hydrochlorothiazide (DIOVAN-HCT) 320-25 MG  tablet TAKE ONE-HALF (1/2) TABLET TWICE A DAY FOR BLOOD PRESSURE 90 tablet 2   No facility-administered medications prior to visit.      Allergies:   Iron; Amiodarone; Norvasc [amlodipine besylate]; Promethazine; Vicodin [hydrocodone-acetaminophen]; and Vioxx [rofecoxib]   Social History   Social History  . Marital status: Widowed    Spouse name: N/A  . Number of children: N/A  . Years of education: N/A   Social History Main Topics  . Smoking status: Former Smoker    Packs/day: 0.30    Years: 57.00    Types: Cigarettes    Quit date: 11/19/2012  . Smokeless tobacco: Never Used  . Alcohol use 0.6 oz/week  1 Glasses of wine per week  . Drug use: No  . Sexual activity: No   Other Topics Concern  . Not on file   Social History Narrative  . No narrative on file     Family History:  The patient's family history includes Cancer in her other and sister; Diabetes in her brother, daughter, mother, other, and sister; Heart attack in her father and mother; Heart disease in her brother, brother, brother, father, mother, other, sister, sister, and sister; Other in her mother and other; Stroke in her brother, brother, and brother.   ROS:   Please see the history of present illness.    ROS All other systems reviewed and are negative.   PHYSICAL EXAM:   VS:  BP (!) 172/86   Pulse 66   Ht 5' 2.5" (1.588 m)   Wt 160 lb 12.8 oz (72.9 kg)   BMI 28.94 kg/m    GEN: Well nourished, well developed, in no acute distress  HEENT: normal  Neck: 5-7 cm JVD, bilateral endarterectomy scars and bilateral carotid bruits, or masses Cardiac: RRR; no murmurs, rubs, or gallops,no edema  Respiratory:  clear to auscultation bilaterally, normal work of breathing GI: soft, nontender, nondistended, + BS MS: no deformity or atrophy  Skin: warm and dry, no rash Neuro:  Alert and Oriented x 3, Strength and sensation are intact Psych: euthymic mood, full affect  Wt Readings from Last 3 Encounters:    09/11/16 160 lb 12.8 oz (72.9 kg)  08/08/16 162 lb (73.5 kg)  02/27/16 153 lb (69.4 kg)      Studies/Labs Reviewed:   EKG:  EKG is ordered today.  Shows sinus rhythm and QS pattern in leads V1-V3 with pre-existing nonspecific ST segment changes across multiple leads.  Recent Labs: 02/06/2016: ALT 9 08/06/2016: BUN 33; Creat 1.59; Potassium 4.9; Sodium 136   Lipid Panel    Component Value Date/Time   CHOL 187 08/06/2016 0950   CHOL 159 02/06/2016 0831   TRIG 92 08/06/2016 0950   HDL 48 08/06/2016 0950   HDL 61 02/06/2016 0831   CHOLHDL 3.9 08/06/2016 0950   VLDL 18 08/06/2016 0950   LDLCALC 121 08/06/2016 0950   LDLCALC 88 02/06/2016 0831     ASSESSMENT:    1. Acute on chronic diastolic congestive heart failure ([place])   2. Coronary artery disease involving native coronary artery of native heart without angina pectoris   3. Bilateral carotid artery stenosis   4. Thoracoabdominal aortic aneurysm, without rupture ([place])   5. Renal artery stenosis ([place])   6. PAD (peripheral artery disease) ([place])   7. Pure hypercholesterolemia   8. Paroxysmal atrial fibrillation ([facility])   9. Essential hypertension   10. Pulmonary HTN   11. Type 2 diabetes mellitus with stage 3 chronic kidney disease, without long-term current use of insulin ([place])   12. CKD (chronic kidney disease) stage 3, GFR 30-59 ml/min   13. Medication management      PLAN:  In order of problems listed above:  1. CHF: She still has evidence of hypervolemia. Probably roughly 7 pounds above "dry weight" NYHA functional class II-III. Reminded her of the importance of daily weight monitoring and gave her a weight-based prescription for loop diuretics. We'll try to get smaller potassium chloride supplements tablets. Still have the option for right and left heart catheterization if we cannot achieve a satisfactory balance between renal dysfunction and heart failure. Recheck labs later this week and arrange for early clinical  follow-up  2. CAD S/P CABG - No symptoms of angina pectoris. (1993 PTCA to circumflex. 2002 2XBMS to ostial RCA, S/P CABG x 3 12/04/12) 3. Carotid disease - has known significant restenosis in the right carotid, asymptomatic. Would be a candidate for carotid stent if this requires another revascularization. 4. Thoracoabdominal aortic aneurysm - stable dimensions by CT scans performed 6 months apart 5. RAS - felt to not be severe enough to cause hypertension 6. PAD - ankle-brachial indices actually look pretty good. She does not report claudication. Doppler study in July of 2013 showed a 50-69% stenosis in the left common iliac artery there was three-vessel runoff in the calf on the right side there was two-vessel runoff to 2 occlusion of the anterior tibial artery with distal reconstitution. 7. HLP - maximum dose of our most potent statin. Ideally would like to drive LDL to less than 70. She is not interested in PCS K9 inhibitors. Her current LDL cholesterol is roughly 60% less than her baseline. 8. PAF - no symptomatic recurrence in years; anticoagulation stopped due to severe GI bleeding. Note poorly tolerated amiodarone therapy in the past. Embolic risk remains very high, although her only stroke in the past was related to complications of carotid surgery rather than an embolic event. Even discounting her stroke, CHADSVasc score is 26 (age 74, gender, HTN, vascular disease, CHF, DM). If atrial fibrillation is detected again, recommend Watchman device 9. HTN- low her blood pressure remains a little erratic, I think we have achieved the best compromise between symptomatic hypotension and adequate control of hypertension. Reevaluate blood pressure after diuresis. 10. PAH - this is probably secondary to diastolic left ventricular dysfunction. Cardiac catheterization was offered, but she declined because she is worried about mechanical complications, as had occurred in the past. 11. Mild diabetes mellitus on  metformin monotherapy 12. Chronic kidney disease stage III  Medication Adjustments/Labs and Tests Ordered: Current medicines are reviewed at length with the patient today.  Concerns regarding medicines are outlined above.  Medication changes, Labs and Tests ordered today are listed in the Patient Instructions below. Patient Instructions  Dr [name] has recommended making the following medication changes: 1. CONTINUE Furosemide 60 mg daily  >>Until weight on home scale is 7lbs LESS THAN today's weight - THEN RESUME 1 TABLET DAILY 2. INCREASE Potassium to 1.5 tablets (30 mEq total) daily  >>Until weight on home scale is 7lbs THAN today's weight - THEN RESUME 1 TABLET DAILY 3. START Magnesium Oxide 400 mg - take 1 tablet daily  Your physician recommends that you return for lab work on Thursday.  Your physician recommends that you schedule a follow-up appointment in 2 weeks with a PA or NP.  Dr [name] recommends that you schedule a follow-up appointment in 3-4 months.  If you need a refill on your cardiac medications before your next appointment, please call your pharmacy.       Signed, [name], MD  09/11/2016 5:07 PM    [facility] [address], [place], [place]  [address] Phone: [phone]; Fax: [phone]

## 2016-09-12 ENCOUNTER — Ambulatory Visit: Payer: Medicare Other | Admitting: Physician Assistant

## 2016-09-12 ENCOUNTER — Telehealth: Payer: Self-pay | Admitting: Cardiovascular Disease

## 2016-09-12 MED ORDER — POTASSIUM CHLORIDE CRYS ER 20 MEQ PO TBCR: 30.0000 meq | EXTENDED_RELEASE_TABLET | Freq: Every day | ORAL | 5 refills | Status: DC

## 2016-09-12 NOTE — Telephone Encounter (Signed)
New Message  Pt c/o medication issue:  1. Name of Medication: Potassium   2. How are you currently taking this medication (dosage and times per day)? 20MEQ  3. Are you having a reaction (difficulty breathing--STAT)? no  4. What is your medication issue? Per pt is not able to swallow the pill; its too big. Pt would like to know if she could be prescribed a smaller form of the pill. Please call back to discuss

## 2016-09-12 NOTE — Telephone Encounter (Signed)
Informed pt that medication does not come in smaller tablet just a capsule and these cannot be cut in half for increased dose that pt is taking. Also, pt states that pharmacy told her that it comes in 10 mg-I only see the 10mg  in capsule, this is not well covered insurance wise. Will try the 1.5 tablet dose and will need refill.

## 2016-09-14 LAB — BASIC METABOLIC PANEL
BUN: 34 mg/dL — ABNORMAL HIGH (ref 7–25)
CALCIUM: 9.4 mg/dL (ref 8.6–10.4)
CHLORIDE: 98 mmol/L (ref 98–110)
CO2: 27 mmol/L (ref 20–31)
CREATININE: 1.98 mg/dL — AB (ref 0.60–0.93)
Glucose, Bld: 116 mg/dL — ABNORMAL HIGH (ref 65–99)
Potassium: 4.8 mmol/L (ref 3.5–5.3)
Sodium: 136 mmol/L (ref 135–146)

## 2016-09-14 LAB — MAGNESIUM: Magnesium: 2.1 mg/dL (ref 1.5–2.5)

## 2016-09-18 ENCOUNTER — Telehealth: Payer: Self-pay | Admitting: *Deleted

## 2016-09-18 ENCOUNTER — Telehealth: Payer: Self-pay | Admitting: Cardiology

## 2016-09-18 NOTE — Telephone Encounter (Signed)
-----   Message from [name], MD sent at 09/17/2016  6:06 PM EST ----- Kidney function has deteriorated a little, as expected with diuresis. Please ask her her weight and breathing status when you call with results.

## 2016-09-18 NOTE — Telephone Encounter (Signed)
Left message to call back in regards to lab work  Results ( may ask to speak to [place] or [place])

## 2016-09-18 NOTE — Telephone Encounter (Signed)
See previous note

## 2016-09-18 NOTE — Telephone Encounter (Signed)
Keep appt as scheduled, but call back if weight increases, please.

## 2016-09-18 NOTE — Telephone Encounter (Signed)
Spoke to patient . Results given ,  patient states breathing is good and she lost 4 lbs last week, after visit. patient has not gain the 4 lbs back. Patient states she still not going to the bathroom a lot - slow stream , no discomfort . She states she just went twice yesterday. She has an appointment with extender on 09/25/16. Will defer to Dr [name]

## 2016-09-18 NOTE — Telephone Encounter (Signed)
New message  Pt is returning call to Trixie Dredge  Please call back

## 2016-09-19 NOTE — Telephone Encounter (Signed)
Spoke to patient . Information given . Verbalized understanding. 

## 2016-09-25 ENCOUNTER — Ambulatory Visit (INDEPENDENT_AMBULATORY_CARE_PROVIDER_SITE_OTHER): Payer: Medicare Other | Admitting: Physician Assistant

## 2016-09-25 ENCOUNTER — Encounter: Payer: Self-pay | Admitting: Physician Assistant

## 2016-09-25 VITALS — BP 152/80 | HR 55 | Ht 62.5 in | Wt 160.0 lb

## 2016-09-25 DIAGNOSIS — I701 Atherosclerosis of renal artery: Secondary | ICD-10-CM

## 2016-09-25 DIAGNOSIS — I10 Essential (primary) hypertension: Secondary | ICD-10-CM

## 2016-09-25 DIAGNOSIS — I5032 Chronic diastolic (congestive) heart failure: Secondary | ICD-10-CM | POA: Diagnosis not present

## 2016-09-25 DIAGNOSIS — N183 Chronic kidney disease, stage 3 unspecified: Secondary | ICD-10-CM

## 2016-09-25 MED ORDER — VALSARTAN 320 MG PO TABS: 160.0000 mg | ORAL_TABLET | Freq: Two times a day (BID) | ORAL | 6 refills | Status: DC

## 2016-09-25 MED ORDER — VALSARTAN 320 MG PO TABS: 160.0000 mg | ORAL_TABLET | Freq: Two times a day (BID) | ORAL | 1 refills | Status: DC

## 2016-09-25 NOTE — Patient Instructions (Signed)
Medication Instructions: STOP Valsartan-HCTZ.  START Valsartan 320 mg--take 1/2 tablet by mouth twice daily.   Labwork: Your physician recommends that you return for lab work today: BMET ---We will call you with instructions when labs are reviewed.   Referral:  You have been referred to Nephrology for Stage 3 CKD.--Pt requests to be seen in January if possible.   Follow-Up: Your physician recommends that you schedule a follow-up appointment in: 1-2 weeks with a Nurse for BP Check.  Your physician recommends that you schedule a follow-up appointment in: January 2018 with Dr. [name].   Any Other Special Instructions will be listed below:  Your physician recommends that you weigh, daily, at the same time every day, and in the same amount of clothing. Please record your daily weights on the handout provided and bring it to your next appointment.   Your physician has requested that you regularly monitor and record your blood pressure readings at home. Please use the same machine at the same time of day to check your readings and record them to bring to your follow-up visit. Please record when you have skipped your medication.  If you need a refill on your cardiac medications before your next appointment, please call your pharmacy.

## 2016-09-25 NOTE — Progress Notes (Signed)
Cardiology Office Note   Date:  09/25/2016   ID:  [name], [name] 1941-04-20, MRN [id number]  PCP:  Jeanmarie Hubert, MD  Cardiologist:  Dr [name] 09/11/2016 Kendra Ferries, PA-C  11/2015   History of Present Illness: Kendra Todd is a 75 y.o. female with a history of CABG 2014 w/ LIMA-LAD, SVG-OM, SVG-RCA, HTN, hypertensive heart dz, thoraco-abd AA 4.1 cm 01/2016, bilat CEA w/ CVA , HL, PAF not anticoagulated due to GIB, CHADSVasc score is 12 (age 48, gender, HTN, vascular disease, CHF, DM).  [name]  11/07, seen by Dr [name], wt-based rx for Lasix given for volume overload, early f/u recommended  Kendra Todd presents for follow up.  She went from 40 mg qd to 60 mg qd of Lasix as directed. By her scales, she has lost 5 lbs. She has gone from 163>>now 158 lbs. She would like to lose some more weight, but does not feel she has extra fluid on board. She is breathing better, LE edema is improved. She denies orthopnea or PND. She has been still taking 1-1/2 tabs Lasix daily till today, when she took 1 tablet.  She feels more able to do what she needs to do.  She has multiple dishes to make for Thanksgiving  She is concerned about her kidney function and wishes to see a Nephrologist. She is afraid she will go into kidney failure.   Her BP is up this am. Yesterday, her BP was low at 98/68. Therefore, she did not take the Coreg at all. She has had problems with fluctuating BP in the past. Sometimes she has to skip the am meds but can take the pm Coreg. This happens 2-3 x month.   She has not felt light-headed or dizzy.   She misunderstood medication instructions and has been taking the Valsartan/HCTZ 320/25 mg at 1-1/2 tabs daily in am, not 1/2 tab bid  She feels she will need Lasix going forward to manage the volume.    Past Medical History:  Diagnosis Date  . Abdominal pain, epigastric   . Acute upper respiratory infections of unspecified site   . Arthritis   . Atrial fibrillation  ([place]) 02/21/2011  . Benign paroxysmal positional vertigo   . CAD (coronary artery disease)    prior coronary stenting  . Candidiasis of vulva and vagina   . Carotid artery disease ([place]) 2001   s/p Bilateral CEA, after CVA  . Cervicalgia   . Decreased pedal pulses 06/04/2011   doppler - bilateral ABIs no evidence of insufficiency; L CIA slightly elevated velocities 20-30% diameter reduction;   . Dyspnea 03/13/2006   Myoview - EF 76%; mild ischemia in apical lateral region, less severe than previous study in 2002  . Edema   . H/O carotid endarterectomy 03/06/2012   CT angio -high grade stenosis of proximal R internal carotid w/ lg posterior ulceration or dissection flap; near occlusive stenosis at origin of nondominantproximal L vertebral artery; <50% stenosis of proximal R vertebral and proximal L subclavian artery  . H/O endarterectomy 09/11/2012   patent L and R carotid sites, R 60-79% restenosed ICA; L <40% restenosed ICA  . H/O endarterectomy 01/08/2012   doppler - R distal common carotid/proximal ICA stenosed 80-99%, L 0-39%  . HTN (hypertension) 11/27/2012  . Hyperlipidemia   . Hypertension   . Limb pain 05/02/2011   doppler - no evidence of thrombus of thrombophlebitis  . Long term (current) use of anticoagulants   . Loss  of weight   . Lumbago   . Myalgia and myositis, unspecified   . Myocardial infarction   . Nonspecific abnormal results of liver function study   . Nontoxic uninodular goiter   . Osteoarthrosis, unspecified whether generalized or localized, unspecified site   . Other malaise and fatigue   . Other manifestations of vitamin A deficiency   . Pain in joint, pelvic region and thigh   . Pain in limb   . Peripheral vascular disease ([place])   . Phlebitis and thrombophlebitis of superficial vessels of lower extremities   . Primary localized osteoarthrosis, hand   . Pulmonary HTN 12/03/2012  . Renal artery stenosis ([place])   . Routine gynecological examination   . Spinal  stenosis, unspecified region other than cervical   . Stenosis of artery ([place]) 06/03/2012   doppler- most distal aspect of abd aorta no aneurysmal dilation; bilateral ABIs - mild L arterial insufficiency, L CIA narrowing w/ 50-69% diameter reduction; L and R SFA both w/ 0-49% diameter reductions  . Stroke ([place]) 2001  . Superficial vein thrombosis 05/11/2015   Left forehead  . Tobacco use disorder   . TR (tricuspid regurgitation), severe 12/02/12 for repair with CABG 12/03/2012  . Type II or unspecified type diabetes mellitus with peripheral circulatory disorders, not stated as uncontrolled(250.70)   . Unspecified cataract   . Unspecified disorder of iris and ciliary body   . Unspecified vitamin D deficiency   . Urinary tract infection, site not specified     Past Surgical History:  Procedure Laterality Date  . ABDOMINAL HYSTERECTOMY  1975  . ANGIOPLASTY  1984 and 1982  . APPENDECTOMY  1954  . ARCH AORTOGRAM  03/30/2011   40% recurrent stenosis at origin of R proximal carotid patch, shelf like recurrent stenosis w/in midsection of patch that does not appear to be flow limiting; normal non-stenosed great vessel origins  . BACK SURGERY  '84,'86, '87   three previous surgeries  . CAROTID ENDARTERECTOMY  2001   Bilateral  . CORONARY ARTERY BYPASS GRAFT  12/04/2012   Procedure: CORONARY ARTERY BYPASS GRAFTING (CABG);  Surgeon: Ivin Poot, MD; LIMA-LAD, SVG-OM, SVG-RCA  . INTRAOPERATIVE TRANSESOPHAGEAL ECHOCARDIOGRAM  12/04/2012   Procedure: INTRAOPERATIVE TRANSESOPHAGEAL ECHOCARDIOGRAM;  Surgeon: Ivin Poot, MD;  Location: [facility];  Service: Open Heart Surgery;  Laterality: N/A;  . KIDNEY STONE SURGERY     Removal  . LEFT HEART CATHETERIZATION WITH CORONARY ANGIOGRAM N/A 11/28/2012   Procedure: LEFT HEART CATHETERIZATION WITH CORONARY ANGIOGRAM;  Surgeon: Leonie Man, MD;  Location: [facility] CATH LAB;  Service: Cardiovascular;  Laterality: N/A;  . PERCUTANEOUS CORONARY STENT INTERVENTION  (PCI-S)  2002   2 BMS in ostial/Prox & mid RCA (mid 2.5 mm 20x  mm, & 2.75 mm x  8 mm ostial)  . RENAL ANGIOGRAM N/A 11/30/2013   Procedure: RENAL ANGIOGRAM;  Surgeon: Lorretta Harp, MD;  Location: [facility] CATH LAB;  Service: Cardiovascular;  Laterality: N/A;  . RIGHT HEART CATHETERIZATION N/A 12/02/2012   Procedure: RIGHT HEART CATH;  Surgeon: Troy Sine, MD;  Location: [facility] CATH LAB;  Service: Cardiovascular;  Laterality: N/A;  . TEE WITH CARDIOVERSION  01/23/2011   EF 99991111; grade 2 diastolic dysfunction, elevated mean LA filling pressure, RV systolic pressure increased consistent w/ mod pulmonary hypertension    Current Outpatient Prescriptions  Medication Sig Dispense Refill  . acetaminophen-codeine (TYLENOL #3) 300-30 MG tablet TAKE ONE TABLET BY MOUTH EVERY 6 HOURS AS NEEDED FOR  MODERATE  PAIN 30 tablet 0  . alendronate (FOSAMAX) 70 MG tablet Take one tablet daily for bones    . aspirin 81 MG tablet Take 81 mg by mouth daily.     . carvedilol (COREG) 25 MG tablet Take 1 tablet (25 mg total) by mouth 2 (two) times daily with a meal. 180 tablet 3  . cloNIDine (CATAPRES) 0.1 MG tablet Take 0.1 mg by mouth as needed (SYSTOLIC BP > Q000111Q).    . furosemide (LASIX) 40 MG tablet Take 1 tablet (40 mg total) by mouth daily. 30 tablet 11  . furosemide (LASIX) 40 MG tablet TAKE ONE TABLET BY MOUTH ONCE DAILY 30 tablet 0  . LUMIGAN 0.01 % SOLN Place 1 drop into both eyes at bedtime.     . magnesium oxide (MAG-OX) 400 MG tablet Take 1 tablet (400 mg total) by mouth daily. 30 tablet 11  . meloxicam (MOBIC) 7.5 MG tablet Take 7.5 mg by mouth daily. FOR ARTHRITIS    . metFORMIN (GLUCOPHAGE) 1000 MG tablet TAKE 1 TABLET TWICE A DAY WITH MEALS 180 tablet 1  . nitroGLYCERIN (NITROSTAT) 0.4 MG SL tablet Place 0.4 mg under the tongue every 5 (five) minutes as needed for chest pain.    . potassium chloride SA (K-DUR,KLOR-CON) 20 MEQ tablet Take 1.5 tablets (30 mEq total) by mouth daily. 45 tablet 5  .  rosuvastatin (CRESTOR) 40 MG tablet Take 1 tablet (40 mg total) by mouth daily. 90 tablet 1  . valsartan-hydrochlorothiazide (DIOVAN-HCT) 320-25 MG tablet TAKE ONE-HALF (1/2) TABLET TWICE A DAY FOR BLOOD PRESSURE 90 tablet 2  . Vitamin D, Ergocalciferol, (DRISDOL) 50000 units CAPS capsule Take 1 capsule by mouth once a week.     No current facility-administered medications for this visit.     Allergies:   Iron; Amiodarone; Norvasc [amlodipine besylate]; Promethazine; Vicodin [hydrocodone-acetaminophen]; and Vioxx [rofecoxib]    Social History:  The patient  reports that she quit smoking about 3 years ago. Her smoking use included Cigarettes. She has a 17.10 pack-year smoking history. She has never used smokeless tobacco. She reports that she drinks about 0.6 oz of alcohol per week . She reports that she does not use drugs.   Family History:  The patient's family history includes Cancer in her other and sister; Diabetes in her brother, daughter, mother, other, and sister; Heart attack in her father and mother; Heart disease in her brother, brother, brother, father, mother, other, sister, sister, and sister; Other in her mother and other; Stroke in her brother, brother, and brother.    ROS:  Please see the history of present illness. All other systems are reviewed and negative.    PHYSICAL EXAM: VS:  BP (!) 152/80   Pulse (!) 55   Ht 5' 2.5" (1.588 m)   Wt 160 lb (72.6 kg)   BMI 28.80 kg/m  , BMI Body mass index is 28.8 kg/m. GEN: Well nourished, well developed, female in no acute distress  HEENT: normal for age  Neck: no JVD, bilateral carotid bruits, no masses Cardiac: RRR; no murmur, no rubs, or gallops Respiratory: decreased BS bases, few rhonchi, clears with cough, normal work of breathing GI: soft, nontender, nondistended, + BS MS: no deformity or atrophy; no edema; distal pulses are 2+ in all 4 extremities   Skin: warm and dry, no rash Neuro:  Strength and sensation are  intact Psych: euthymic mood, full affect   EKG:  EKG is not ordered today.  11/2013 PV angio 1: Abdominal  aortogram-the renal arteries appeared patent on abdominal aortography. The abdominal aorta above and below the renal arteries appeared moderately atherosclerotic. 2: Left lower extremity-there is a 75% fairly focal distal left common iliac artery stenosis. 3: Right lower extremity-there was a 60% plaque in the proximal right common iliac artery that appeared ulcerated. There was approximately a 20 mm pullback gradient using a 5 French endhole catheter and administration of 200 mcg of intra-arterial nitroglycerin glycerin through the SideArm sheath. 4: Renal arteries-there was approximately 40-50% bilateral renal artery stenosis noted on selective right and left renal artery angiograms. IMPRESSION:moderate but not severe bilateral renal artery stenosis. This did not appear to be hematoma was significant. I suspected that the renal Dopplers overestimate the degree of stenosis the patient has "essential hypertension". She has moderate iliac disease but it is not clear to me whether she has lifestyle limiting claudication. This will need to be further investigated. The sheath was removed and pressure held on the groin to achieve hemostasis. The patient left the Cath Lab in stable condition. She'll be hydrated, remain recumbent for 4 hours and will be discharged home. She will see him mid-level provider back in one to 2 weeks and me back in 4-6 weeks. We'll obtain lower extremity arterial Doppler studies.  12/2012 CABG 1. Coronary artery bypass grafting x3 (left internal mammary artery to     LAD, saphenous vein graft to circumflex marginal, saphenous vein     graft to right coronary artery). 2. Endoscopic harvest of right leg greater saphenous vein.  ECHO: 11/2015 - Left ventricle: The cavity size was normal. There was moderate   concentric hypertrophy. Systolic function was normal. The    estimated ejection fraction was in the range of 60% to 65%. Wall   motion was normal; there were no regional wall motion   abnormalities. Features are consistent with a pseudonormal left   ventricular filling pattern, with concomitant abnormal relaxation   and increased filling pressure (grade 2 diastolic dysfunction). - Mitral valve: Calcified annulus. There was moderate regurgitation   directed eccentrically. - Left atrium: The atrium was mildly dilated. - Right ventricle: The cavity size was mildly dilated. Wall   thickness was normal. - Right atrium: The atrium was moderately dilated. - Tricuspid valve: There was moderate regurgitation. - Pulmonary arteries: Systolic pressure was severely increased. PA   peak pressure: 73 mm Hg (S). Impressions: - Compared to the prior study, there has been no significant   interval change.  01/2016 CT CHEST (Dr Oneida Alar) IMPRESSION:    4.1 cm aneurysm of descending thoracic aorta is noted with significant atheromatous disease present. This is not significantly changed compared to prior exam. The ascending thoracic aorta and transverse aortic arch are normal in caliber.    Moderate focal stenosis is noted at the origin the left subclavian artery.   3.8 cm proximal abdominal aortic aneurysm is noted with significant atheromatous disease present.   Severe stenoses are seen involving the origins of the celiac and superior mesenteric arteries, as well as the right renal artery.   Normal enhancement of the right kidney is noted. However, there is significantly delayed enhancement of the left kidney, presumably due to severe atheromatous disease involving the origin of the left renal artery.    Recent Labs: 02/06/2016: ALT 9 09/11/2016: BUN 34; Creat 1.98; Magnesium 2.1; Potassium 4.8; Sodium 136    Lipid Panel    Component Value Date/Time   CHOL 187 08/06/2016 0950   CHOL 159 02/06/2016 0831  TRIG 92 08/06/2016 0950   HDL 48 08/06/2016  0950   HDL 61 02/06/2016 0831   CHOLHDL 3.9 08/06/2016 0950   VLDL 18 08/06/2016 0950   LDLCALC 121 08/06/2016 0950   LDLCALC 88 02/06/2016 0831     Wt Readings from Last 3 Encounters:  09/25/16 160 lb (72.6 kg)  09/11/16 160 lb 12.8 oz (72.9 kg)  08/08/16 162 lb (73.5 kg)     Other studies Reviewed: Additional studies/ records that were reviewed today include: Office notes, hospital records and testing.  ASSESSMENT AND PLAN:  1.  Chronic diastolic CHF: Pt is doing well from a volume standpoint. She understands the connection between excess sodium intake, excess fluid intake and volume overload. She is to continue the Lasix at 40 mg qd. As she continues to need Lasix, I will d/c the HCTZ. I will recheck a BMET today.   2. HTN: her BP is variable, she manipulates rx to manage this. However, she was taking too much of the Diovan/HCTZ. Will stop this, put her on Diovan alone and decrease dose to the amount she was supposed to be taking. Ideally, she would get to a steady state with her BP. She is bradycardic, so she may need to be on a lower dose of Coreg. However, she does not seem to be symptomatic from a low heart rate, continue this for now. If BUN/Cr continue to trend up, will have to d/c or decrease Diovan, further limiting BP control options.  RN visit in 2 weeks to follow up on BP variations, weights, how often meds are held.   3. CKD IIII-IV: BUN/Cr were elevated above her norm at last BMET. Recheck today. Will be happy to refer her to Nephrology, pt does not wish to be seen until January. Continue current rx for now.   Current medicines are reviewed at length with the patient today.  The patient does not have concerns regarding medicines.  The following changes have been made:  Decrease Diovan to intended dose, d/c HCTZ  Labs/ tests ordered today include:   Orders Placed This Encounter  Procedures  . BASIC METABOLIC PANEL WITH GFR  . Ambulatory referral to Nephrology      Disposition:   FU with Dr [name]  Signed, Kendra Ferries, PA-C  09/25/2016 11:42 AM    [place] Phone: [phone]; Fax: [phone]  This note was written with the assistance of speech recognition software. Please excuse any transcriptional errors.

## 2016-09-26 LAB — BASIC METABOLIC PANEL WITH GFR
BUN: 60 mg/dL — ABNORMAL HIGH (ref 7–25)
CALCIUM: 9.5 mg/dL (ref 8.6–10.4)
CO2: 27 mmol/L (ref 20–31)
CREATININE: 2.64 mg/dL — AB (ref 0.60–0.93)
Chloride: 100 mmol/L (ref 98–110)
GFR, EST AFRICAN AMERICAN: 19 mL/min — AB (ref >=60)
GFR, EST NON AFRICAN AMERICAN: 17 mL/min — AB (ref >=60)
GLUCOSE: 92 mg/dL (ref 65–99)
Potassium: 5 mmol/L (ref 3.5–5.3)
SODIUM: 136 mmol/L (ref 135–146)

## 2016-10-10 DIAGNOSIS — Z9889 Other specified postprocedural states: Secondary | ICD-10-CM | POA: Diagnosis not present

## 2016-10-10 DIAGNOSIS — I701 Atherosclerosis of renal artery: Secondary | ICD-10-CM | POA: Diagnosis not present

## 2016-10-10 DIAGNOSIS — E1129 Type 2 diabetes mellitus with other diabetic kidney complication: Secondary | ICD-10-CM | POA: Diagnosis not present

## 2016-10-10 DIAGNOSIS — I10 Essential (primary) hypertension: Secondary | ICD-10-CM | POA: Diagnosis not present

## 2016-10-10 DIAGNOSIS — I5032 Chronic diastolic (congestive) heart failure: Secondary | ICD-10-CM | POA: Diagnosis not present

## 2016-10-10 DIAGNOSIS — N183 Chronic kidney disease, stage 3 (moderate): Secondary | ICD-10-CM | POA: Diagnosis not present

## 2016-10-10 DIAGNOSIS — I4891 Unspecified atrial fibrillation: Secondary | ICD-10-CM | POA: Diagnosis not present

## 2016-10-10 DIAGNOSIS — M4800 Spinal stenosis, site unspecified: Secondary | ICD-10-CM | POA: Diagnosis not present

## 2016-10-10 DIAGNOSIS — N179 Acute kidney failure, unspecified: Secondary | ICD-10-CM | POA: Diagnosis not present

## 2016-10-11 ENCOUNTER — Other Ambulatory Visit: Payer: Self-pay | Admitting: Nephrology

## 2016-10-11 DIAGNOSIS — N179 Acute kidney failure, unspecified: Secondary | ICD-10-CM

## 2016-10-17 ENCOUNTER — Ambulatory Visit
Admission: RE | Admit: 2016-10-17 | Discharge: 2016-10-17 | Disposition: A | Discharge status: Home / Self Care | Payer: Medicare Other | Source: Ambulatory Visit | Attending: Nephrology | Admitting: Nephrology

## 2016-10-17 DIAGNOSIS — N179 Acute kidney failure, unspecified: Secondary | ICD-10-CM | POA: Diagnosis not present

## 2016-10-19 ENCOUNTER — Ambulatory Visit (INDEPENDENT_AMBULATORY_CARE_PROVIDER_SITE_OTHER): Payer: Medicare Other | Admitting: Pharmacist

## 2016-10-19 VITALS — BP 154/70 | HR 63

## 2016-10-19 DIAGNOSIS — I10 Essential (primary) hypertension: Secondary | ICD-10-CM

## 2016-10-19 DIAGNOSIS — H02401 Unspecified ptosis of right eyelid: Secondary | ICD-10-CM | POA: Diagnosis not present

## 2016-10-19 DIAGNOSIS — I701 Atherosclerosis of renal artery: Secondary | ICD-10-CM

## 2016-10-19 DIAGNOSIS — H401131 Primary open-angle glaucoma, bilateral, mild stage: Secondary | ICD-10-CM | POA: Diagnosis not present

## 2016-10-19 DIAGNOSIS — H10021 Other mucopurulent conjunctivitis, right eye: Secondary | ICD-10-CM | POA: Diagnosis not present

## 2016-10-19 NOTE — Progress Notes (Signed)
Patient ID: Kendra Todd                 DOB: 01/08/1941                      MRN: [id number]     HPI: Kendra Todd is a 75 y.o. female patient of Dr. [name] with PMH below who presents today for hypertension follow up. She has seen Tommy Medal, PharmD in 2016 at which time her blood pressure was very labile, with as much as a 100 point swing in systolic measurements. Since this time she has been seen by Dr. Loletha Grayer and our PA, [name]. At her most recent visit it was found that she was taking valsartan/HCTZ 320/25mg  1.5 tablets daily - instead of 1/2 tablet daily. This was discontinued and she was started on valsartan 320mg  alone. After the visit her labs returned with elevated Scr and her diovan was stopped and lasix was changed to PRN.   She presents today and states she saw the kidney doctor on Tuesday. At this appt she had an ultrasound of her kidney and bladder. She reports that she has not need clonidine since seeing [name], PA.   She reports she has been skipping her morning dose of carvedilol several times in a week due to low pressures.   Cardiac Hx: HTN, CAD with CABG, carotid artery disease, thoracoabdominal aortic aneurysm, PAF, stroke, hyperlipidemia  Family Hx: one of 9 siblings, both parents and 7 siblings all deceased from MI, mothers family had DM; 3 children one starting to develop cardiac problems (cardiomyopathy?)  Social Hx: coffee each morning, rare soda  Diet: believes to be low salt diet, does add some when cooking; drinks 16-20 oz water per day, eats out usually once weekly.    Exercise: none, problems with arthritis in hips and knees  Current HTN meds:  Lasix 40mg  PRN - has not needed any Clonidine 0.1mg  PRN - has not needed any Carvedilol 25mg  BID - skipping several morning doses  Previously tried:  Norvasc - nausea and vomiting  BP goal: <150/90  Home BP readings:  She reports she has a wrist cuff and arm cuff, but uses her wrist cuff most  frequently. This morning she was 87/66. She has not had any measurements >150 in her left arm, but occasionally does in her right arm.   Wt Readings from Last 3 Encounters:  09/25/16 160 lb (72.6 kg)  09/11/16 160 lb 12.8 oz (72.9 kg)  08/08/16 162 lb (73.5 kg)   BP Readings from Last 3 Encounters:  10/19/16 (!) 154/70  09/25/16 (!) 152/80  09/11/16 (!) 172/86   Pulse Readings from Last 3 Encounters:  10/19/16 63  09/25/16 (!) 55  09/11/16 66    Renal function: CrCl cannot be calculated (Patient's most recent lab result is older than the maximum 21 days allowed.).  Past Medical History:  Diagnosis Date  . Abdominal pain, epigastric   . Acute upper respiratory infections of unspecified site   . Arthritis   . Atrial fibrillation ([place]) 02/21/2011  . Benign paroxysmal positional vertigo   . CAD (coronary artery disease)    prior coronary stenting  . Candidiasis of vulva and vagina   . Carotid artery disease ([place]) 2001   s/p Bilateral CEA, after CVA  . Cervicalgia   . Decreased pedal pulses 06/04/2011   doppler - bilateral ABIs no evidence of insufficiency; L CIA slightly elevated velocities 20-30% diameter reduction;   .  Dyspnea 03/13/2006   Myoview - EF 76%; mild ischemia in apical lateral region, less severe than previous study in 2002  . Edema   . H/O carotid endarterectomy 03/06/2012   CT angio -high grade stenosis of proximal R internal carotid w/ lg posterior ulceration or dissection flap; near occlusive stenosis at origin of nondominantproximal L vertebral artery; <50% stenosis of proximal R vertebral and proximal L subclavian artery  . H/O endarterectomy 09/11/2012   patent L and R carotid sites, R 60-79% restenosed ICA; L <40% restenosed ICA  . H/O endarterectomy 01/08/2012   doppler - R distal common carotid/proximal ICA stenosed 80-99%, L 0-39%  . HTN (hypertension) 11/27/2012  . Hyperlipidemia   . Hypertension   . Limb pain 05/02/2011   doppler - no evidence of thrombus  of thrombophlebitis  . Long term (current) use of anticoagulants   . Loss of weight   . Lumbago   . Myalgia and myositis, unspecified   . Myocardial infarction   . Nonspecific abnormal results of liver function study   . Nontoxic uninodular goiter   . Osteoarthrosis, unspecified whether generalized or localized, unspecified site   . Other malaise and fatigue   . Other manifestations of vitamin A deficiency   . Pain in joint, pelvic region and thigh   . Pain in limb   . Peripheral vascular disease ([place])   . Phlebitis and thrombophlebitis of superficial vessels of lower extremities   . Primary localized osteoarthrosis, hand   . Pulmonary HTN 12/03/2012  . Renal artery stenosis ([place])   . Routine gynecological examination   . Spinal stenosis, unspecified region other than cervical   . Stenosis of artery ([place]) 06/03/2012   doppler- most distal aspect of abd aorta no aneurysmal dilation; bilateral ABIs - mild L arterial insufficiency, L CIA narrowing w/ 50-69% diameter reduction; L and R SFA both w/ 0-49% diameter reductions  . Stroke ([place]) 2001  . Superficial vein thrombosis 05/11/2015   Left forehead  . Tobacco use disorder   . TR (tricuspid regurgitation), severe 12/02/12 for repair with CABG 12/03/2012  . Type II or unspecified type diabetes mellitus with peripheral circulatory disorders, not stated as uncontrolled(250.70)   . Unspecified cataract   . Unspecified disorder of iris and ciliary body   . Unspecified vitamin D deficiency   . Urinary tract infection, site not specified     Current Outpatient Prescriptions on File Prior to Visit  Medication Sig Dispense Refill  . alendronate (FOSAMAX) 70 MG tablet Take one tablet daily for bones    . aspirin 81 MG tablet Take 81 mg by mouth daily.     . cloNIDine (CATAPRES) 0.1 MG tablet Take 0.1 mg by mouth as needed (SYSTOLIC BP > Q000111Q).    [name] LUMIGAN 0.01 % SOLN Place 1 drop into both eyes at bedtime.     . meloxicam (MOBIC) 7.5 MG tablet  Take 7.5 mg by mouth daily. FOR ARTHRITIS    . metFORMIN (GLUCOPHAGE) 1000 MG tablet TAKE 1 TABLET TWICE A DAY WITH MEALS 180 tablet 1  . rosuvastatin (CRESTOR) 40 MG tablet Take 1 tablet (40 mg total) by mouth daily. 90 tablet 1  . Vitamin D, Ergocalciferol, (DRISDOL) 50000 units CAPS capsule Take 1 capsule by mouth once a week.    . magnesium oxide (MAG-OX) 400 MG tablet Take 1 tablet (400 mg total) by mouth daily. (Patient not taking: Reported on 10/19/2016) 30 tablet 11  . nitroGLYCERIN (NITROSTAT) 0.4 MG SL tablet Place  0.4 mg under the tongue every 5 (five) minutes as needed for chest pain.     No current facility-administered medications on file prior to visit.     Allergies  Allergen Reactions  . Iron Hives  . Amiodarone Nausea And Vomiting  . Norvasc [Amlodipine Besylate] Nausea And Vomiting  . Promethazine Other (See Comments)    hallucinations  . Vicodin [Hydrocodone-Acetaminophen] Other (See Comments)    Caused arrythmia   . Vioxx [Rofecoxib] Nausea And Vomiting    Blood pressure (!) 154/70, pulse 63.   Assessment/Plan: Hypertension: BP significantly different between arms which she reports is consistent with her home measurements. This is likely due to her PAD. I believe that her pressures are somewhat labile due to skipped morning doses of carvedilol. In effort to level her pressures out will have her try to take 12.5mg  of carvedilol BID and not skip doses unless pressure <100 or significant dizziness. Have asked she bring record of pressures and any skipped doses to visit with Dr. Loletha Grayer in 3-4 weeks. She can follow up with hypertension clinic after this for additional medication titration.    Thank you, Lelan Pons. Patterson [place], [place]

## 2016-10-19 NOTE — Patient Instructions (Addendum)
Return for a follow up appointment in 3-4 weeks with Dr. Loletha Grayer  Check your blood pressure at home daily (if able) and keep record of the readings.  Take your BP meds as follows: START taking Carvedilol 12.5mg  (1/2 tablet of current supply) twice daily  Bring all of your meds, your BP cuff and your record of home blood pressures to your next appointment.  Exercise as you're able, try to walk approximately 30 minutes per day.  Keep salt intake to a minimum, especially watch canned and prepared boxed foods.  Eat more fresh fruits and vegetables and fewer canned items.  Avoid eating in fast food restaurants.    HOW TO TAKE YOUR BLOOD PRESSURE: . Rest 5 minutes before taking your blood pressure. .  Don't smoke or drink caffeinated beverages for at least 30 minutes before. . Take your blood pressure before (not after) you eat. . Sit comfortably with your back supported and both feet on the floor (don't cross your legs). . Elevate your arm to heart level on a table or a desk. . Use the proper sized cuff. It should fit smoothly and snugly around your bare upper arm. There should be enough room to slip a fingertip under the cuff. The bottom edge of the cuff should be 1 inch above the crease of the elbow. . Ideally, take 3 measurements at one sitting and record the average.

## 2016-10-23 ENCOUNTER — Encounter: Payer: Self-pay | Admitting: Pharmacist

## 2016-11-07 ENCOUNTER — Other Ambulatory Visit: Payer: Self-pay | Admitting: Physician Assistant

## 2016-11-26 ENCOUNTER — Other Ambulatory Visit: Payer: Self-pay | Admitting: Physician Assistant

## 2016-12-03 ENCOUNTER — Ambulatory Visit (INDEPENDENT_AMBULATORY_CARE_PROVIDER_SITE_OTHER): Payer: Medicare Other | Admitting: Cardiovascular Disease

## 2016-12-03 ENCOUNTER — Encounter: Payer: Self-pay | Admitting: Cardiovascular Disease

## 2016-12-03 VITALS — BP 168/80 | HR 62 | Ht 62.5 in | Wt 159.0 lb

## 2016-12-03 DIAGNOSIS — I272 Pulmonary hypertension, unspecified: Secondary | ICD-10-CM

## 2016-12-03 DIAGNOSIS — I480 Paroxysmal atrial fibrillation: Secondary | ICD-10-CM

## 2016-12-03 DIAGNOSIS — E7800 Pure hypercholesterolemia, unspecified: Secondary | ICD-10-CM | POA: Diagnosis not present

## 2016-12-03 DIAGNOSIS — E1122 Type 2 diabetes mellitus with diabetic chronic kidney disease: Secondary | ICD-10-CM | POA: Diagnosis not present

## 2016-12-03 DIAGNOSIS — I2510 Atherosclerotic heart disease of native coronary artery without angina pectoris: Secondary | ICD-10-CM

## 2016-12-03 DIAGNOSIS — I739 Peripheral vascular disease, unspecified: Secondary | ICD-10-CM | POA: Diagnosis not present

## 2016-12-03 DIAGNOSIS — I5032 Chronic diastolic (congestive) heart failure: Secondary | ICD-10-CM | POA: Diagnosis not present

## 2016-12-03 DIAGNOSIS — I10 Essential (primary) hypertension: Secondary | ICD-10-CM | POA: Diagnosis not present

## 2016-12-03 DIAGNOSIS — N183 Chronic kidney disease, stage 3 unspecified: Secondary | ICD-10-CM

## 2016-12-03 DIAGNOSIS — I701 Atherosclerosis of renal artery: Secondary | ICD-10-CM

## 2016-12-03 DIAGNOSIS — I716 Thoracoabdominal aortic aneurysm, without rupture, unspecified: Secondary | ICD-10-CM

## 2016-12-03 DIAGNOSIS — I6523 Occlusion and stenosis of bilateral carotid arteries: Secondary | ICD-10-CM | POA: Diagnosis not present

## 2016-12-03 NOTE — Progress Notes (Signed)
Patient ID: Kendra Todd, female   DOB: 06-13-1941, 76 y.o.   MRN: [id number]    Cardiology Office Note    Date:  12/03/2016   ID:  Kendra Todd, Kendra Todd [birth year], MRN [id number]  PCP:  Kendra Hubert, MD  Cardiologist:   Kendra Klein, MD   Chief Complaint  Patient presents with  . Follow-up    pt c/o DOE, COULD NOT GET HER BP IN LEFT ARM    History of Present Illness:  Kendra Todd is a 76 y.o. female with CAD status post CABG, hypertensive heart disease with diastolic HF, severe hypertension, thoracoabdominal aortic aneurysm, recurrent carotid artery disease, hyperlipidemia,  Remote paroxysmal atrial fibrillation and pulmonary artery hypertension, chronic kidney disease stage III.  She seems to have settled in a pattern of well compensated heart failure without complaints of dyspnea at rest/orthopnea and without most NYHA class II exertional dyspnea. She had transient worsening of her renal failure while taking Diovan HCT and this has been discontinued. Her creatinine peaked at 2.6 and has now improved to 1.8. He was also taking almost daily meloxicam for chronic back pain. She is seeing Dr. Hollie Todd at [facility]. She has an upcoming appointment with Dr. Nyoka Todd when she will have her hemoglobin A1c and hematocrit checked.  She takes clonidine on an as-needed basis for elevated blood pressure but has not needed this more frequently than at most once a week.  She denies problems with angina pectoris, dizziness, lightheadedness, syncope and has rare palpitations. She has a slightly lower blood pressure in her left upper extremity compared with the right but does not describe symptoms suggestive of claudication or steal syndrome.  Kendra Todd has extensive coronary and peripheral arterial disease. She underwent coronary bypass surgery January 2014, after having previously undergone stents to the right coronary artery in 2002 and several angioplasty procedures before that. Echocardiogram performed in  January 2017 showed severe pulmonary artery hypertension (estimated systolic PA pressure 73 mmHg) in the setting of pseudo-normal mitral inflow consistent with grade 2 diastolic dysfunction/elevated left atrial filling pressures. Moderate mitral regurgitation was also present  She has undergone bilateral carotid endarterectomies and has recurrent stenoses. Of her surgeries was complicated by a dissection flap and a stroke, from which she has recovered. She has a moderate size type 2 thoracoabdominal aortic aneurysm (recent reimaging with Dr. Oneida Todd shows a stable diameter of 4 cm).   She has previously undergone TEE guided cardioversion for paroxysmal atrial fibrillation (2012) Had postoperative atrial fibrillation after bypass surgery. Anticoagulants were stopped due to gastrointestinal bleeding that required transfusion. She takes aspirin. She was poorly tolerant to amiodarone (nausea, anorexia, 20 lb weight loss).  She has severe systemic hypertension and it has taken a long time to find a regimen that controls it.  Finally seemed to have settled on a combination of high-dose carvedilol, Clonidine, valsartan/hydrochlorothiazide. She has moderate bilateral renal artery stenosis at angiography January 2015, not felt to be severe enough to cause secondary hypertension.   Past Medical History:  Diagnosis Date  . Abdominal pain, epigastric   . Acute upper respiratory infections of unspecified site   . Arthritis   . Atrial fibrillation ([place]) 02/21/2011  . Benign paroxysmal positional vertigo   . CAD (coronary artery disease)    prior coronary stenting  . Candidiasis of vulva and vagina   . Carotid artery disease ([place]) 2001   s/p Bilateral CEA, after CVA  . Cervicalgia   . Decreased pedal pulses 06/04/2011  doppler - bilateral ABIs no evidence of insufficiency; L CIA slightly elevated velocities 20-30% diameter reduction;   . Dyspnea 03/13/2006   Myoview - EF 76%; mild ischemia in apical lateral  region, less severe than previous study in 2002  . Edema   . H/O carotid endarterectomy 03/06/2012   CT angio -high grade stenosis of proximal R internal carotid w/ lg posterior ulceration or dissection flap; near occlusive stenosis at origin of nondominantproximal L vertebral artery; <50% stenosis of proximal R vertebral and proximal L subclavian artery  . H/O endarterectomy 09/11/2012   patent L and R carotid sites, R 60-79% restenosed ICA; L <40% restenosed ICA  . H/O endarterectomy 01/08/2012   doppler - R distal common carotid/proximal ICA stenosed 80-99%, L 0-39%  . HTN (hypertension) 11/27/2012  . Hyperlipidemia   . Hypertension   . Limb pain 05/02/2011   doppler - no evidence of thrombus of thrombophlebitis  . Long term (current) use of anticoagulants   . Loss of weight   . Lumbago   . Myalgia and myositis, unspecified   . Myocardial infarction   . Nonspecific abnormal results of liver function study   . Nontoxic uninodular goiter   . Osteoarthrosis, unspecified whether generalized or localized, unspecified site   . Other malaise and fatigue   . Other manifestations of vitamin A deficiency   . Pain in joint, pelvic region and thigh   . Pain in limb   . Peripheral vascular disease ([place])   . Phlebitis and thrombophlebitis of superficial vessels of lower extremities   . Primary localized osteoarthrosis, hand   . Pulmonary HTN 12/03/2012  . Renal artery stenosis ([place])   . Routine gynecological examination   . Spinal stenosis, unspecified region other than cervical   . Stenosis of artery ([place]) 06/03/2012   doppler- most distal aspect of abd aorta no aneurysmal dilation; bilateral ABIs - mild L arterial insufficiency, L CIA narrowing w/ 50-69% diameter reduction; L and R SFA both w/ 0-49% diameter reductions  . Stroke ([place]) 2001  . Superficial vein thrombosis 05/11/2015   Left forehead  . Tobacco use disorder   . TR (tricuspid regurgitation), severe 12/02/12 for repair with CABG 12/03/2012   . Type II or unspecified type diabetes mellitus with peripheral circulatory disorders, not stated as uncontrolled(250.70)   . Unspecified cataract   . Unspecified disorder of iris and ciliary body   . Unspecified vitamin D deficiency   . Urinary tract infection, site not specified     Past Surgical History:  Procedure Laterality Date  . ABDOMINAL HYSTERECTOMY  1975  . ANGIOPLASTY  1984 and 1982  . APPENDECTOMY  1954  . ARCH AORTOGRAM  03/30/2011   40% recurrent stenosis at origin of R proximal carotid patch, shelf like recurrent stenosis w/in midsection of patch that does not appear to be flow limiting; normal non-stenosed great vessel origins  . BACK SURGERY  '84,'86, '87   three previous surgeries  . CAROTID ENDARTERECTOMY  2001   Bilateral  . CORONARY ARTERY BYPASS GRAFT  12/04/2012   Procedure: CORONARY ARTERY BYPASS GRAFTING (CABG);  Surgeon: Ivin Poot, MD; LIMA-LAD, SVG-OM, SVG-RCA  . INTRAOPERATIVE TRANSESOPHAGEAL ECHOCARDIOGRAM  12/04/2012   Procedure: INTRAOPERATIVE TRANSESOPHAGEAL ECHOCARDIOGRAM;  Surgeon: Ivin Poot, MD;  Location: [facility];  Service: Open Heart Surgery;  Laterality: N/A;  . KIDNEY STONE SURGERY     Removal  . LEFT HEART CATHETERIZATION WITH CORONARY ANGIOGRAM N/A 11/28/2012   Procedure: LEFT HEART CATHETERIZATION WITH CORONARY ANGIOGRAM;  Surgeon: Leonie Man, MD;  Location[facility] LAB;  Service: Cardiovascular;  Laterality: N/A;  . PERCUTANEOUS CORONARY STENT INTERVENTION (PCI-S)  2002   2 BMS in ostial/Prox & mid RCA (mid 2.5 mm 20x  mm, & 2.75 mm x  8 mm ostial)  . RENAL ANGIOGRAM N/A 11/30/2013   Procedure: RENAL ANGIOGRAM;  Surgeon: Lorretta Harp, MD;  Location[facility] C[facility] LAB;  Service: Cardiovascular;  Laterality: N/A;  . RIGHT HEART CATHETERIZATION N/A 12/02/2012   Procedure: RIGHT HEART CATH;  Surgeon: Troy Sine, MD;  Location: [facility] C[facility] LAB;  Service: Cardiovascular;  Laterality: N/A;  . TEE WITH CARDIOVERSION  01/23/2011   EF 50-55%;  grade 2 diastolic dysfunction, elevated mean LA filling pressure, RV systolic pressure increased consistent w/ mod pulmonary hypertension    Current Medications: Outpatient Medications Prior to Visit  Medication Sig Dispense Refill  . alendronate (FOSAMAX) 70 MG tablet Take one tablet daily for bones    . aspirin 81 MG tablet Take 81 mg by mouth daily.     . carvedilol (COREG) 25 MG tablet Take 0.5 tablets (12.5 mg total) by mouth 2 (two) times daily with a meal. 180 tablet 3  . cloNIDine (CATAPRES) 0.1 MG tablet Take 0.1 mg by mouth as needed (SYSTOLIC BP > [id number]).    [name] LUMIGAN 0.01 % SOLN Place 1 drop into both eyes at bedtime.     . meloxicam (MOBIC) 7.5 MG tablet Take 7.5 mg by mouth daily. FOR ARTHRITIS    . metFORMIN (GLUCOPHAGE) 1000 MG tablet TAKE 1 TABLET TWICE A DAY WITH MEALS 180 tablet 1  . nitroGLYCERIN (NITROSTAT) 0.4 MG SL tablet Place 0.4 mg under the tongue every 5 (five) minutes as needed for chest pain.    . rosuvastatin (CRESTOR) 40 MG tablet Take 1 tablet (40 mg total) by mouth daily. 90 tablet 1  . Vitamin D, Ergocalciferol, (DRISDOL) 50000 units CAPS capsule Take 1 capsule by mouth once a week.    . magnesium oxide (MAG-OX) 400 MG tablet Take 1 tablet (400 mg total) by mouth daily. (Patient not taking: Reported on 10/19/2016) 30 tablet 11  . meloxicam (MOBIC) 7.5 MG tablet TAKE ONE TABLET BY MOUTH ONCE DAILY TO HELP ARTHRITIS (Patient not taking: Reported on 12/03/2016) 30 tablet 11   No facility-administered medications prior to visit.      Allergies:   Iron; Vicodin [hydrocodone-acetaminophen]; Amiodarone; Norvasc [amlodipine besylate]; Promethazine; and Vioxx [rofecoxib]   Social History   Social History  . Marital status: Widowed    Spouse name: N/A  . Number of children: N/A  . Years of education: N/A   Social History Main Topics  . Smoking status: Former Smoker    Packs/day: 0.30    Years: 57.00    Types: Cigarettes    Quit date: 11/19/2012  . Smokeless  tobacco: Never Used  . Alcohol use 0.6 oz/week    1 Glasses of wine per week  . Drug use: No  . Sexual activity: No   Other Topics Concern  . None   Social History Narrative  . None     Family History:  The patient's family history includes Cancer in her other and sister; Diabetes in her brother, daughter, mother, other, and sister; Heart attack in her father and mother; Heart disease in her brother, brother, brother, father, mother, other, sister, sister, and sister; Other in her mother and other; Stroke in her brother, brother, and brother.   ROS:   Please  see the history of present illness.    ROS All other systems reviewed and are negative.   PHYSICAL EXAM:   VS:  BP (!) 168/80 (BP Location: Right Arm)   Pulse 62   Ht 5' 2.5" (1.588 m)   Wt 72.1 kg (159 lb)   BMI 28.62 kg/m    Recheck blood pressure right arm 150/81 mmHg, left arm 138/79 mmHg. GEN: Well nourished, well developed, in no acute distress  HEENT: normal  Neck: 2-4 cm JVD, bilateral endarterectomy scars and bilateral carotid bruits, No goiter or masses Cardiac: RRR; no murmurs, rubs, or gallops,no edema  Respiratory:  clear to auscultation bilaterally, normal work of breathing GI: soft, nontender, nondistended, + BS MS: no deformity or atrophy  Skin: warm and dry, no rash Neuro:  Alert and Oriented x 3, Strength and sensation are intact Psych: euthymic mood, full affect  Wt Readings from Last 3 Encounters:  12/03/16 72.1 kg (159 lb)  09/25/16 72.6 kg (160 lb)  09/11/16 72.9 kg (160 lb 12.8 oz)      Studies/Labs Reviewed:   EKG:  EKG is ordered today.  Shows sinus rhythm With a single PAC   Recent Labs: 02/06/2016: ALT 9 09/11/2016: Magnesium 2.1 09/25/2016: BUN 60; Creat 2.64; Potassium 5.0; Sodium 136   Lipid Panel    Component Value Date/Time   CHOL 187 08/06/2016 0950   CHOL 159 02/06/2016 0831   TRIG 92 08/06/2016 0950   HDL 48 08/06/2016 0950   HDL 61 02/06/2016 0831   CHOLHDL 3.9  08/06/2016 0950   VLDL 18 08/06/2016 0950   LDLCALC 121 08/06/2016 0950   LDLCALC 88 02/06/2016 0831     ASSESSMENT:    1. Chronic diastolic CHF (congestive heart failure), NYHA class 2 ([place])   2. Coronary artery disease involving native coronary artery of native heart without angina pectoris   3. Bilateral carotid artery stenosis   4. Thoracoabdominal aortic aneurysm, without rupture ([place])   5. Renal artery stenosis ([place])   6. PAD (peripheral artery disease) ([place])   7. Pure hypercholesterolemia   8. Paroxysmal atrial fibrillation ([facility])   9. Essential hypertension   10. Pulmonary HTN   11. Type 2 diabetes mellitus with stage 3 chronic kidney disease, without long-term current use of insulin ([place])   12. CKD (chronic kidney disease) stage 3, GFR 30-59 ml/min      PLAN:  In order of problems listed above:  1. CHF: Despite only a minor change in her weight, clinically she does not have evidence of hypervolemia. NYHA functional class II-III. competing priorities for management of CHF and renal insufficiency. We'll not add any more diuretics at this time. Reminded her of the importance of daily weight monitoring. Still have the option for right and left heart catheterization if we cannot achieve a satisfactory balance between renal dysfunction and heart failure.  2. CAD S/P CABG - No symptoms of angina pectoris. (1993 PTCA to circumflex. 2002 2XBMS to ostial RCA, S/P CABG x 3 12/04/12) 3. Carotid disease - has known significant restenosis in the right carotid, asymptomatic. Would be a candidate for carotid stent if this requires another revascularization. 4. Thoracoabdominal aortic aneurysm - stable dimensions by CT scans performed 6 months apart. Like to ability to further contrast based imaging studies. 5. RAS - felt to not be severe enough to cause hypertension 6. PAD - ankle-brachial indices actually look pretty good. She does not report claudication. Doppler study in July of 2013 showed  a 50-69%  stenosis in the left common iliac artery there was three-vessel runoff in the calf on the right side there was two-vessel runoff to 2 occlusion of the anterior tibial artery with distal reconstitution. 7. HLP - on maximum dose of our most potent statin her LDL was still 88. Ideally would like to drive LDL to less than 70. She is not interested in PCS K9 inhibitors. Her current LDL cholesterol is roughly 60% less than her baseline. No change made to current meds. 8. PAF - no symptomatic recurrence in years; anticoagulation stopped due to severe GI bleeding. Note poorly tolerated amiodarone therapy in the past. Embolic risk remains very high, although her only stroke in the past was related to complications of carotid surgery rather than an embolic event. Even discounting her stroke, CHADSVasc score is 54 (age 36, gender, HTN, vascular disease, CHF, DM). If atrial fibrillation is detected again, recommend Watchman device 9. HTN- low her blood pressure remains a little erratic, I think we have achieved the best compromise between symptomatic hypotension and adequate control of hypertension. Always check blood pressure in right arm. She appears to have at least mild left subclavian artery stenosis. 10. PAH - this is probably secondary to diastolic left ventricular dysfunction. Cardiac catheterization was offered, but she declined because she is worried about mechanical complications, as had occurred in the past. 11. Mild diabetes mellitus on metformin monotherapy 12. Chronic kidney disease stage III, GFR approximately 30  Medication Adjustments/Labs and Tests Ordered: Current medicines are reviewed at length with the patient today.  Concerns regarding medicines are outlined above.  Medication changes, Labs and Tests ordered today are listed in the Patient Instructions below. Patient Instructions  Dr [name] recommends that you schedule a follow-up appointment in 12 months. You will receive a reminder  letter in the mail two months in advance. If you don't receive a letter, please call our office to schedule the follow-up appointment.  If you need a refill on your cardiac medications before your next appointment, please call your pharmacy.    Signed, Kendra Klein, MD  12/03/2016 5:58 PM    [facility] [address], [place], [place]  [address] Phone: [phone]; Fax: [phone]

## 2016-12-03 NOTE — Patient Instructions (Signed)
Dr Croitoru recommends that you schedule a follow-up appointment in 12 months. You will receive a reminder letter in the mail two months in advance. If you don't receive a letter, please call our office to schedule the follow-up appointment.  If you need a refill on your cardiac medications before your next appointment, please call your pharmacy. 

## 2016-12-07 ENCOUNTER — Telehealth: Payer: Self-pay

## 2016-12-07 ENCOUNTER — Other Ambulatory Visit: Payer: Medicare Other

## 2016-12-07 DIAGNOSIS — E785 Hyperlipidemia, unspecified: Secondary | ICD-10-CM

## 2016-12-07 DIAGNOSIS — N182 Chronic kidney disease, stage 2 (mild): Principal | ICD-10-CM

## 2016-12-07 DIAGNOSIS — I150 Renovascular hypertension: Secondary | ICD-10-CM | POA: Diagnosis not present

## 2016-12-07 DIAGNOSIS — E1122 Type 2 diabetes mellitus with diabetic chronic kidney disease: Secondary | ICD-10-CM

## 2016-12-07 LAB — HEMOGLOBIN A1C
Hgb A1c MFr Bld: 5.7 % — ABNORMAL HIGH (ref <5.7)
MEAN PLASMA GLUCOSE: 117 mg/dL

## 2016-12-07 LAB — COMPREHENSIVE METABOLIC PANEL
ALT: 8 U/L (ref 6–29)
AST: 15 U/L (ref 10–35)
Albumin: 3.9 g/dL (ref 3.6–5.1)
Alkaline Phosphatase: 38 U/L (ref 33–130)
BUN: 25 mg/dL (ref 7–25)
CHLORIDE: 103 mmol/L (ref 98–110)
CO2: 25 mmol/L (ref 20–31)
CREATININE: 1.32 mg/dL — AB (ref 0.60–0.93)
Calcium: 9.1 mg/dL (ref 8.6–10.4)
GLUCOSE: 207 mg/dL — AB (ref 65–99)
Potassium: 4.5 mmol/L (ref 3.5–5.3)
SODIUM: 136 mmol/L (ref 135–146)
TOTAL PROTEIN: 6.5 g/dL (ref 6.1–8.1)
Total Bilirubin: 0.9 mg/dL (ref 0.2–1.2)

## 2016-12-07 LAB — LIPID PANEL
Cholesterol: 145 mg/dL (ref <200)
HDL: 50 mg/dL — ABNORMAL LOW (ref >50)
LDL CALC: 82 mg/dL (ref <100)
Total CHOL/HDL Ratio: 2.9 Ratio (ref <5.0)
Triglycerides: 63 mg/dL (ref <150)
VLDL: 13 mg/dL (ref <30)

## 2016-12-07 NOTE — Addendum Note (Signed)
Addended by: Diana Eves on: 12/07/2016 09:32 AM   Modules accepted: Orders

## 2016-12-07 NOTE — Telephone Encounter (Signed)
lmtcb

## 2016-12-07 NOTE — Telephone Encounter (Signed)
-----   Message from [name], MD sent at 12/05/2016  5:27 PM EST ----- Hmm. Her LDL was rather high.  Please add Zetia 10 mg daily and recheck in 3 months [name] ----- Message ----- From: Dionne Bucy Neva Ramaswamy, CMA Sent: 12/05/2016   4:48 PM To: [name], MD  She was taking Crestor in November and is still currently taking it.   ----- Message ----- From: [name], MD Sent: 12/05/2016   4:30 PM To: Dionne Bucy Denisa Enterline, CMA  Please confirm whether she was taking her Crestor when her labs were last checked in November. Also please make sure she is still taking it today. [name]

## 2016-12-10 ENCOUNTER — Telehealth: Payer: Self-pay | Admitting: Cardiovascular Disease

## 2016-12-10 ENCOUNTER — Other Ambulatory Visit: Payer: Medicare Other

## 2016-12-10 NOTE — Telephone Encounter (Signed)
New Message     Returning [place] call about medication please call she is at home

## 2016-12-12 ENCOUNTER — Ambulatory Visit (INDEPENDENT_AMBULATORY_CARE_PROVIDER_SITE_OTHER): Payer: Medicare Other

## 2016-12-12 ENCOUNTER — Ambulatory Visit (INDEPENDENT_AMBULATORY_CARE_PROVIDER_SITE_OTHER): Payer: Medicare Other | Admitting: Internal Medicine

## 2016-12-12 ENCOUNTER — Encounter: Payer: Self-pay | Admitting: Internal Medicine

## 2016-12-12 VITALS — BP 150/74 | HR 62 | Temp 98.0°F | Ht 63.0 in | Wt 160.8 lb

## 2016-12-12 VITALS — BP 150/74 | HR 62 | Temp 98.0°F | Ht 63.0 in | Wt 160.0 lb

## 2016-12-12 DIAGNOSIS — R609 Edema, unspecified: Secondary | ICD-10-CM | POA: Diagnosis not present

## 2016-12-12 DIAGNOSIS — I10 Essential (primary) hypertension: Secondary | ICD-10-CM | POA: Diagnosis not present

## 2016-12-12 DIAGNOSIS — E1122 Type 2 diabetes mellitus with diabetic chronic kidney disease: Secondary | ICD-10-CM | POA: Diagnosis not present

## 2016-12-12 DIAGNOSIS — I701 Atherosclerosis of renal artery: Secondary | ICD-10-CM | POA: Diagnosis not present

## 2016-12-12 DIAGNOSIS — Z23 Encounter for immunization: Secondary | ICD-10-CM | POA: Diagnosis not present

## 2016-12-12 DIAGNOSIS — N183 Chronic kidney disease, stage 3 unspecified: Secondary | ICD-10-CM

## 2016-12-12 DIAGNOSIS — Z0000 Encounter for general adult medical examination without abnormal findings: Secondary | ICD-10-CM | POA: Diagnosis not present

## 2016-12-12 DIAGNOSIS — E7800 Pure hypercholesterolemia, unspecified: Secondary | ICD-10-CM | POA: Diagnosis not present

## 2016-12-12 DIAGNOSIS — I2510 Atherosclerotic heart disease of native coronary artery without angina pectoris: Secondary | ICD-10-CM

## 2016-12-12 NOTE — Patient Instructions (Addendum)
Kendra Todd , Thank you for taking time to come for your Medicare Wellness Visit. I appreciate your ongoing commitment to your health goals. Please review the following plan we discussed and let me know if I can assist you in the future.   These are the goals we discussed: Goals    . Weight (lb) < 155 lb (70.3 kg)          Starting 12/12/16, I will attempt to decrease my weight by 5 lbs, to reach my goal weight of 155 lbs.        This is a list of the screening recommended for you and due dates:  Health Maintenance  Topic Date Due  . Complete foot exam   01/12/2016  . Shingles Vaccine  12/07/2019*  . Hemoglobin A1C  06/06/2017  . Urine Protein Check  08/06/2017  . Eye exam for diabetics  09/18/2017  . Tetanus Vaccine  11/13/2021  . DEXA scan (bone density measurement)  Completed  . Pneumonia vaccines  Completed  . Flu Shot  Excluded  *Topic was postponed. The date shown is not the original due date.  Preventive Care for Adults  A healthy lifestyle and preventive care can promote health and wellness. Preventive health guidelines for adults include the following key practices.  . A routine yearly physical is a good way to check with your health care provider about your health and preventive screening. It is a chance to share any concerns and updates on your health and to receive a thorough exam.  . Visit your dentist for a routine exam and preventive care every 6 months. Brush your teeth twice a day and floss once a day. Good oral hygiene prevents tooth decay and gum disease.  . The frequency of eye exams is based on your age, health, family medical history, use  of contact lenses, and other factors. Follow your health care provider's ecommendations for frequency of eye exams.  . Eat a healthy diet. Foods like vegetables, fruits, whole grains, low-fat dairy products, and lean protein foods contain the nutrients you need without too many calories. Decrease your intake of foods high in  solid fats, added sugars, and salt. Eat the right amount of calories for you. Get information about a proper diet from your health care provider, if necessary.  . Regular physical exercise is one of the most important things you can do for your health. Most adults should get at least 150 minutes of moderate-intensity exercise (any activity that increases your heart rate and causes you to sweat) each week. In addition, most adults need muscle-strengthening exercises on 2 or more days a week.  Silver Sneakers may be a benefit available to you. To determine eligibility, you may visit the website: www.silversneakers.com or contact program at 858-521-6865 Mon-Fri between 8AM-8PM.   . Maintain a healthy weight. The body mass index (BMI) is a screening tool to identify possible weight problems. It provides an estimate of body fat based on height and weight. Your health care provider can find your BMI and can help you achieve or maintain a healthy weight.   For adults 20 years and older: ? A BMI below 18.5 is considered underweight. ? A BMI of 18.5 to 24.9 is normal. ? A BMI of 25 to 29.9 is considered overweight. ? A BMI of 30 and above is considered obese.   . Maintain normal blood lipids and cholesterol levels by exercising and minimizing your intake of saturated fat. Eat a balanced diet with  plenty of fruit and vegetables. Blood tests for lipids and cholesterol should begin at age 63 and be repeated every 5 years. If your lipid or cholesterol levels are high, you are over 50, or you are at high risk for heart disease, you may need your cholesterol levels checked more frequently. Ongoing high lipid and cholesterol levels should be treated with medicines if diet and exercise are not working.  . If you smoke, find out from your health care provider how to quit. If you do not use tobacco, please do not start.  . If you choose to drink alcohol, please do not consume more than 2 drinks per day. One drink  is considered to be 12 ounces (355 mL) of beer, 5 ounces (148 mL) of wine, or 1.5 ounces (44 mL) of liquor.  . If you are 71-26 years old, ask your health care provider if you should take aspirin to prevent strokes.  . Use sunscreen. Apply sunscreen liberally and repeatedly throughout the day. You should seek shade when your shadow is shorter than you. Protect yourself by wearing long sleeves, pants, a wide-brimmed hat, and sunglasses year round, whenever you are outdoors.  . Once a month, do a whole body skin exam, using a mirror to look at the skin on your back. Tell your health care provider of new moles, moles that have irregular borders, moles that are larger than a pencil eraser, or moles that have changed in shape or color.

## 2016-12-12 NOTE — Progress Notes (Signed)
Facility  [facility]    Place of Service:   OFFICE    Allergies  Allergen Reactions  . Iron Hives  . Vicodin [Hydrocodone-Acetaminophen] Other (See Comments)    Caused arrythmia   . Amiodarone Nausea And Vomiting  . Norvasc [Amlodipine Besylate] Nausea And Vomiting  . Promethazine Other (See Comments)    hallucinations  . Vioxx [Rofecoxib] Nausea And Vomiting    Chief Complaint  Patient presents with  . Medical Management of Chronic Issues    4 month medication management blood suga, CKD, blood pressure, cholesterol, review labs.     HPI:  Saw Dr. [name]  12/03/16. Rechecked BP and circulation.  Type 2 diabetes mellitus with stage 3 chronic kidney disease, without long-term current use of insulin (HCC) - controlled  Pure hypercholesterolemia - controlled  Essential hypertension - mild elevation of SBP.  Coronary artery disease involving native coronary artery of native heart without angina pectoris - stable  CKD (chronic kidney disease) stage 3, GFR 30-59 ml/min - saw Dr. Hollie Salk, nephrologist. She stopped some of her chronic medications. Patient has mild elevation of SBP, but the BUN and Creatine are half what they were 2 months ago.  Edema, unspecified type - trace bipedal edema.    Medications: Patient's Medications  New Prescriptions   No medications on file  Previous Medications   ALENDRONATE (FOSAMAX) 70 MG TABLET    Take one tablet daily for bones   ASPIRIN 81 MG TABLET    Take 81 mg by mouth daily.    CARVEDILOL (COREG) 25 MG TABLET    Take 0.5 tablets (12.5 mg total) by mouth 2 (two) times daily with a meal.   CLONIDINE (CATAPRES) 0.1 MG TABLET    Take 0.1 mg by mouth as needed (SYSTOLIC BP > 109).   LUMIGAN 0.01 % SOLN    Place 1 drop into both eyes at bedtime.    MELOXICAM (MOBIC) 7.5 MG TABLET    Take 7.5 mg by mouth daily. FOR ARTHRITIS   METFORMIN (GLUCOPHAGE) 1000 MG TABLET    TAKE 1 TABLET TWICE A DAY WITH MEALS   NITROGLYCERIN (NITROSTAT) 0.4 MG SL  TABLET    Place 0.4 mg under the tongue every 5 (five) minutes as needed for chest pain.   ROSUVASTATIN (CRESTOR) 40 MG TABLET    Take 1 tablet (40 mg total) by mouth daily.   VITAMIN D, ERGOCALCIFEROL, (DRISDOL) 50000 UNITS CAPS CAPSULE    Take 1 capsule by mouth once a week.  Modified Medications   No medications on file  Discontinued Medications   No medications on file    Review of Systems  Constitutional: Negative for activity change, appetite change, chills, diaphoresis, fatigue, fever and unexpected weight change.  HENT: Negative for congestion, ear discharge, ear pain, hearing loss, postnasal drip, rhinorrhea, sore throat, tinnitus, trouble swallowing and voice change.   Eyes: Negative for pain, redness, itching and visual disturbance (corrective lenses).  Respiratory: Positive for shortness of breath (with exertion). Negative for cough, choking and wheezing.   Cardiovascular: Positive for leg swelling. Negative for chest pain and palpitations.       Hx of AF; currently in NSR  Gastrointestinal: Negative for abdominal distention, abdominal pain, constipation, diarrhea and nausea.  Endocrine: Negative for cold intolerance, heat intolerance, polydipsia, polyphagia and polyuria.        diabetes  Genitourinary: Positive for frequency and urgency. Negative for difficulty urinating, dysuria, flank pain, hematuria, pelvic pain and vaginal discharge.  Musculoskeletal: Positive  for arthralgias and back pain. Negative for gait problem, myalgias, neck pain and neck stiffness.  Skin: Negative for color change, pallor and rash.       Rash on the left chest wall resolved  Allergic/Immunologic: Negative.   Neurological: Negative for dizziness, tremors, seizures, syncope, weakness, numbness and headaches.       Intermittent numbness, tingking, and discomfort in both feet  Hematological: Negative for adenopathy. Does not bruise/bleed easily.  Psychiatric/Behavioral: Negative for agitation,  behavioral problems, confusion, dysphoric mood, hallucinations, sleep disturbance and suicidal ideas. The patient is not nervous/anxious and is not hyperactive.     Vitals:   12/12/16 1153  BP: (!) 150/74  Pulse: 62  Temp: 98 F (36.7 C)  TempSrc: Oral  SpO2: 97%  Weight: 160 lb (72.6 kg)  Height: '5\' 3"'$  (1.6 m)   Body mass index is 28.34 kg/m. Wt Readings from Last 3 Encounters:  12/12/16 160 lb (72.6 kg)  12/12/16 160 lb 12.8 oz (72.9 kg)  12/03/16 159 lb (72.1 kg)      Physical Exam  Constitutional: She is oriented to person, place, and time. She appears well-developed and well-nourished. No distress.  HENT:  Head: Normocephalic.  Nose: Nose normal.  Upper and lower dentures present.  Eyes: Conjunctivae are normal. Pupils are equal, round, and reactive to light.  Wears prescription lenses. Right cataract.  Neck: Normal range of motion. Neck supple. No JVD present. No tracheal deviation present. No thyromegaly present.  Cardiovascular: Normal rate, regular rhythm and normal heart sounds.  Exam reveals no gallop and no friction rub.   No murmur heard. Diminished dorsalis pedis pulses bilaterally and diminished posterior tibial pulses bilaterally.  Pulmonary/Chest: Effort normal and breath sounds normal. No respiratory distress. She has no wheezes. She has no rales. She exhibits no tenderness.  Abdominal: Soft. Bowel sounds are normal. She exhibits no distension and no mass. There is no tenderness.  Musculoskeletal: She exhibits tenderness. She exhibits no edema.  Pain in multiple joints  Lymphadenopathy:    She has no cervical adenopathy.  Neurological: She is alert and oriented to person, place, and time. She has normal reflexes. No cranial nerve deficit. Coordination normal.  Diminished sensation to vibration and monofilament testing  Skin: Skin is warm and dry. No rash noted. No erythema. No pallor.  Resolution of prior rash on the left chest wall.  Psychiatric: She  has a normal mood and affect. Her behavior is normal. Judgment normal.    Labs reviewed: Lab Summary Latest Ref Rng & Units 12/07/2016 09/25/2016 09/11/2016 08/06/2016  Hemoglobin 13.0-17.0 g/dL (None) (None) (None) (None)  Hematocrit 39.0-52.0 % (None) (None) (None) (None)  White count - (None) (None) (None) (None)  Platelet count - (None) (None) (None) (None)  Sodium 135 - 146 mmol/L 136 136 136 136  Potassium 3.5 - 5.3 mmol/L 4.5 5.0 4.8 4.9  Calcium 8.6 - 10.4 mg/dL 9.1 9.5 9.4 9.3  Phosphorus - (None) (None) (None) (None)  Creatinine 0.60 - 0.93 mg/dL 1.32(H) 2.64(H) 1.98(H) 1.59(H)  AST 10 - 35 U/L 15 (None) (None) (None)  Alk Phos 33 - 130 U/L 38 (None) (None) (None)  Bilirubin 0.2 - 1.2 mg/dL 0.9 (None) (None) (None)  Glucose 65 - 99 mg/dL 207(H) 92 116(H) 115(H)  Cholesterol <200 mg/dL 145 (None) (None) 187  HDL cholesterol >50 mg/dL 50(L) (None) (None) 48  Triglycerides <150 mg/dL 63 (None) (None) 92  LDL Direct - (None) (None) (None) (None)  LDL Calc <100 mg/dL 82 (None) (None) 121  Total protein 6.1 - 8.1 g/dL 6.5 (None) (None) (None)  Albumin 3.6 - 5.1 g/dL 3.9 (None) (None) (None)  Some recent data might be hidden   Lab Results  Component Value Date   TSH 1.436 11/24/2013   TSH 2.654 11/28/2012   TSH 0.990 01/22/2011   Lab Results  Component Value Date   BUN 25 12/07/2016   BUN 60 (H) 09/25/2016   BUN 34 (H) 09/11/2016   Lab Results  Component Value Date   HGBA1C 5.7 (H) 12/07/2016   HGBA1C 6.6 (H) 08/06/2016   HGBA1C 6.5 (H) 02/06/2016    Assessment/Plan  1. Type 2 diabetes mellitus with stage 3 chronic kidney disease, without long-term current use of insulin (HCC) The current medical regimen is effective;  continue present plan and medications. - Hemoglobin A1c; Future - Comprehensive metabolic panel; Future  2. Pure hypercholesterolemia The current medical regimen is effective;  continue present plan and medications. - Lipid panel; Future -  Microalbumin, urine; Future  3. Essential hypertension The current medical regimen is effective;  continue present plan and medications. - Comprehensive metabolic panel; Future  4. Coronary artery disease involving native coronary artery of native heart without angina pectoris stable  5. CKD (chronic kidney disease) stage 3, GFR 30-59 ml/min Improved in the lst 2 months - Comprehensive metabolic panel; Future  6. Edema, unspecified type Trace. Observe.

## 2016-12-12 NOTE — Progress Notes (Signed)
Subjective:   Kendra Todd is a 76 y.o. female who presents for an Initial Medicare Annual Wellness Visit.  Review of Systems     Cardiac Risk Factors include: advanced age (>67men, >90 women);diabetes mellitus;dyslipidemia;family history of premature cardiovascular disease;hypertension;sedentary lifestyle;smoking/ tobacco exposure     Objective:    Today's Vitals   12/12/16 1103 12/12/16 1105  BP: (!) 150/74   Pulse: 62   Temp: 98 F (36.7 C)   TempSrc: Oral   SpO2: 97%   Weight: 160 lb 12.8 oz (72.9 kg)   Height: 5\' 3"  (1.6 m)   PainSc:  5    Body mass index is 28.48 kg/m.   Current Medications (verified) Outpatient Encounter Prescriptions as of 12/12/2016  Medication Sig  . alendronate (FOSAMAX) 70 MG tablet Take one tablet daily for bones  . aspirin 81 MG tablet Take 81 mg by mouth daily.   . carvedilol (COREG) 25 MG tablet Take 0.5 tablets (12.5 mg total) by mouth 2 (two) times daily with a meal.  . cloNIDine (CATAPRES) 0.1 MG tablet Take 0.1 mg by mouth as needed (SYSTOLIC BP > [id number]).  [name] LUMIGAN 0.01 % SOLN Place 1 drop into both eyes at bedtime.   . meloxicam (MOBIC) 7.5 MG tablet Take 7.5 mg by mouth daily. FOR ARTHRITIS  . metFORMIN (GLUCOPHAGE) 1000 MG tablet TAKE 1 TABLET TWICE A DAY WITH MEALS  . nitroGLYCERIN (NITROSTAT) 0.4 MG SL tablet Place 0.4 mg under the tongue every 5 (five) minutes as needed for chest pain.  . rosuvastatin (CRESTOR) 40 MG tablet Take 1 tablet (40 mg total) by mouth daily.  . Vitamin D, Ergocalciferol, (DRISDOL) 50000 units CAPS capsule Take 1 capsule by mouth once a week.   No facility-administered encounter medications on file as of 12/12/2016.     Allergies (verified) Iron; Vicodin [hydrocodone-acetaminophen]; Amiodarone; Norvasc [amlodipine besylate]; Promethazine; and Vioxx [rofecoxib]   History: Past Medical History:  Diagnosis Date  . Abdominal pain, epigastric   . Acute upper respiratory infections of unspecified site   .  Arthritis   . Atrial fibrillation ([place]) 02/21/2011  . Benign paroxysmal positional vertigo   . CAD (coronary artery disease)    prior coronary stenting  . Candidiasis of vulva and vagina   . Carotid artery disease ([place]) 2001   s/p Bilateral CEA, after CVA  . Cervicalgia   . Decreased pedal pulses 06/04/2011   doppler - bilateral ABIs no evidence of insufficiency; L CIA slightly elevated velocities 20-30% diameter reduction;   . Dyspnea 03/13/2006   Myoview - EF 76%; mild ischemia in apical lateral region, less severe than previous study in 2002  . Edema   . H/O carotid endarterectomy 03/06/2012   CT angio -high grade stenosis of proximal R internal carotid w/ lg posterior ulceration or dissection flap; near occlusive stenosis at origin of nondominantproximal L vertebral artery; <50% stenosis of proximal R vertebral and proximal L subclavian artery  . H/O endarterectomy 09/11/2012   patent L and R carotid sites, R 60-79% restenosed ICA; L <40% restenosed ICA  . H/O endarterectomy 01/08/2012   doppler - R distal common carotid/proximal ICA stenosed 80-99%, L 0-39%  . HTN (hypertension) 11/27/2012  . Hyperlipidemia   . Hypertension   . Limb pain 05/02/2011   doppler - no evidence of thrombus of thrombophlebitis  . Long term (current) use of anticoagulants   . Loss of weight   . Lumbago   . Myalgia and myositis, unspecified   .  Myocardial infarction   . Nonspecific abnormal results of liver function study   . Nontoxic uninodular goiter   . Osteoarthrosis, unspecified whether generalized or localized, unspecified site   . Other malaise and fatigue   . Other manifestations of vitamin A deficiency   . Pain in joint, pelvic region and thigh   . Pain in limb   . Peripheral vascular disease ([place])   . Phlebitis and thrombophlebitis of superficial vessels of lower extremities   . Primary localized osteoarthrosis, hand   . Pulmonary HTN 12/03/2012  . Renal artery stenosis ([place])   . Routine  gynecological examination   . Spinal stenosis, unspecified region other than cervical   . Stenosis of artery ([place]) 06/03/2012   doppler- most distal aspect of abd aorta no aneurysmal dilation; bilateral ABIs - mild L arterial insufficiency, L CIA narrowing w/ 50-69% diameter reduction; L and R SFA both w/ 0-49% diameter reductions  . Stroke ([place]) 2001  . Superficial vein thrombosis 05/11/2015   Left forehead  . Tobacco use disorder   . TR (tricuspid regurgitation), severe 12/02/12 for repair with CABG 12/03/2012  . Type II or unspecified type diabetes mellitus with peripheral circulatory disorders, not stated as uncontrolled(250.70)   . Unspecified cataract   . Unspecified disorder of iris and ciliary body   . Unspecified vitamin D deficiency   . Urinary tract infection, site not specified    Past Surgical History:  Procedure Laterality Date  . ABDOMINAL HYSTERECTOMY  1975  . ANGIOPLASTY  1984 and 1982  . APPENDECTOMY  1954  . ARCH AORTOGRAM  03/30/2011   40% recurrent stenosis at origin of R proximal carotid patch, shelf like recurrent stenosis w/in midsection of patch that does not appear to be flow limiting; normal non-stenosed great vessel origins  . BACK SURGERY  '84,'86, '87   three previous surgeries  . CAROTID ENDARTERECTOMY  2001   Bilateral  . CORONARY ARTERY BYPASS GRAFT  12/04/2012   Procedure: CORONARY ARTERY BYPASS GRAFTING (CABG);  Surgeon: Ivin Poot, MD; LIMA-LAD, SVG-OM, SVG-RCA  . INTRAOPERATIVE TRANSESOPHAGEAL ECHOCARDIOGRAM  12/04/2012   Procedure: INTRAOPERATIVE TRANSESOPHAGEAL ECHOCARDIOGRAM;  Surgeon: Ivin Poot, MD;  Location: [facility];  Service: Open Heart Surgery;  Laterality: N/A;  . KIDNEY STONE SURGERY     Removal  . LEFT HEART CATHETERIZATION WITH CORONARY ANGIOGRAM N/A 11/28/2012   Procedure: LEFT HEART CATHETERIZATION WITH CORONARY ANGIOGRAM;  Surgeon: Leonie Man, MD;  Location: [facility] CATH LAB;  Service: Cardiovascular;  Laterality: N/A;  .  PERCUTANEOUS CORONARY STENT INTERVENTION (PCI-S)  2002   2 BMS in ostial/Prox & mid RCA (mid 2.5 mm 20x  mm, & 2.75 mm x  8 mm ostial)  . RENAL ANGIOGRAM N/A 11/30/2013   Procedure: RENAL ANGIOGRAM;  Surgeon: Lorretta Harp, MD;  Location: [facility] CATH LAB;  Service: Cardiovascular;  Laterality: N/A;  . RIGHT HEART CATHETERIZATION N/A 12/02/2012   Procedure: RIGHT HEART CATH;  Surgeon: Troy Sine, MD;  Location: [facility] CATH LAB;  Service: Cardiovascular;  Laterality: N/A;  . TEE WITH CARDIOVERSION  01/23/2011   EF 99991111; grade 2 diastolic dysfunction, elevated mean LA filling pressure, RV systolic pressure increased consistent w/ mod pulmonary hypertension   Family History  Problem Relation Age of Onset  . Other Mother     CVA  . Diabetes Mother   . Heart disease Mother     Heart disease before age 49  . Heart attack Mother   . Heart disease Father  Heart Disease before age 26  . Heart attack Father   . Heart disease Sister     Amputation  . Diabetes Sister   . Heart disease Brother     Heart disease before age 78  . Diabetes Brother   . Stroke Brother   . Stroke Brother   . Heart disease Brother   . Stroke Brother   . Heart disease Brother   . Heart disease Sister   . Heart disease Sister   . Diabetes Daughter   . Cancer Sister   . Diabetes Other   . Cancer Other     breast  . Heart disease Other     cad  . Other Other     cva   Social History   Occupational History  . Not on file.   Social History Main Topics  . Smoking status: Former Smoker    Packs/day: 0.30    Years: 57.00    Types: Cigarettes    Quit date: 11/19/2012  . Smokeless tobacco: Never Used  . Alcohol use 0.6 oz/week    1 Glasses of wine per week  . Drug use: No  . Sexual activity: No    Tobacco Counseling Counseling given: No   Activities of Daily Living In your present state of health, do you have any difficulty performing the following activities: 12/12/2016  Hearing? N  Vision? Y    Difficulty concentrating or making decisions? N  Walking or climbing stairs? Y  Dressing or bathing? N  Doing errands, shopping? N  Preparing Food and eating ? N  Using the Toilet? N  In the past six months, have you accidently leaked urine? Y  Do you have problems with loss of bowel control? N  Managing your Medications? N  Managing your Finances? N  Housekeeping or managing your Housekeeping? N  Some recent data might be hidden    Immunizations and Health Maintenance Immunization History  Administered Date(s) Administered  . Pneumococcal Conjugate-13 12/12/2016  . Pneumococcal Polysaccharide-23 11/14/2011  . Tdap 11/14/2011   Health Maintenance Due  Topic Date Due  . FOOT EXAM  01/12/2016    Patient Care Team: Estill Dooms, MD as PCP - General (Internal Medicine) Ara Kussmaul, MD (Ophthalmology) Unice Bailey, MD (Internal Medicine) [name], MD as Attending Physician (Cardiology) Marylynn Pearson, MD as Consulting Physician (Ophthalmology)  Indicate any recent Medical Services you may have received from other than [facility] providers in the past year (date may be approximate).     Assessment:   This is a routine wellness examination for [place].  Hearing/Vision screen Hearing Screening Comments: Last hearing screen was done with Dr. Gwenlyn Found several years ago. Pt denies any problems with hearing at this time.  Vision Screening Comments: Last eye exam was done in Nov. 2017 with Dr. Venetia Maxon. Pt has F/U appt 12/20/16 to discuss cataract surgery.   Dietary issues and exercise activities discussed: Current Exercise Habits: The patient does not participate in regular exercise at present, Exercise limited by: None identified  Goals    . Weight (lb) < 155 lb (70.3 kg)          Starting 12/12/16, I will attempt to decrease my weight by 5 lbs, to reach my goal weight of 155 lbs.       Depression Screen PHQ 2/9 Scores 12/12/2016 02/08/2016 06/08/2015 05/11/2015 01/13/2015  11/29/2014 07/21/2014  PHQ - 2 Score 0 0 0 0 2 0 0  PHQ- 9 Score - - - -  4 - -    Fall Risk Fall Risk  12/12/2016 02/08/2016 10/05/2015 06/08/2015 05/25/2015  Falls in the past year? No No No No No    Cognitive Function: MMSE - Mini Mental State Exam 12/12/2016  Orientation to time 5  Orientation to Place 5  Registration 3  Attention/ Calculation 5  Recall 3  Language- name 2 objects 2  Language- repeat 1  Language- follow 3 step command 3  Language- read & follow direction 1  Write a sentence 1  Copy design 1  Total score 30        Screening Tests Health Maintenance  Topic Date Due  . FOOT EXAM  01/12/2016  . ZOSTAVAX  12/07/2019 (Originally 02/15/1999)  . HEMOGLOBIN A1C  06/06/2017  . URINE MICROALBUMIN  08/06/2017  . OPHTHALMOLOGY EXAM  09/18/2017  . TETANUS/TDAP  11/13/2021  . DEXA SCAN  Completed  . PNA vac Low Risk Adult  Completed  . INFLUENZA VACCINE  Excluded      Plan:    I have personally reviewed and addressed the Medicare Annual Wellness questionnaire and have noted the following in the patient's chart:  A. Medical and social history B. Use of alcohol, tobacco or illicit drugs  C. Current medications and supplements D. Functional ability and status E.  Nutritional status F.  Physical activity G. Advance directives H. List of other physicians I.  Hospitalizations, surgeries, and ER visits in previous 12 months J.  [place] to include hearing, vision, cognitive, depression L. Referrals and appointments - none  In addition, I have reviewed and discussed with patient certain preventive protocols, quality metrics, and best practice recommendations. A written personalized care plan for preventive services as well as general preventive health recommendations were provided to patient.  See attached scanned questionnaire for additional information.   Signed,   Allyn Kenner, LPN Health Advisor    I have reviewed the information entered by the  Health Advisor. I was present in the office during the time of patient interaction and was available for consultation. I agree with the documentation and advice.  Viviann Spare Nyoka Cowden, MD

## 2016-12-12 NOTE — Progress Notes (Signed)
Quick Notes   Health Maintenance:  Due for Foot Exam    Abnormal Screen: None; MMSE-30/30 Passed Clock test   Patient Concerns:  Pt would like to discuss her meds that her nephrologist took her off of.     Nurse Concerns:  None  I have reviewed the information entered by the Health Advisor. I was present in the office during the time of patient interaction and was available for consultation. I agree with the documentation and advice.  Viviann Spare Nyoka Cowden, MD

## 2016-12-13 NOTE — Telephone Encounter (Signed)
See telephone encounter from 12/07/16.

## 2016-12-13 NOTE — Telephone Encounter (Signed)
Discussed recommendations with patient. She states she had repeat blood work done.  Discussed new results with [name] - no medication changes.  Patient notified and verbalized appreciation for phone call.

## 2016-12-20 DIAGNOSIS — H401131 Primary open-angle glaucoma, bilateral, mild stage: Secondary | ICD-10-CM | POA: Diagnosis not present

## 2016-12-20 DIAGNOSIS — H2513 Age-related nuclear cataract, bilateral: Secondary | ICD-10-CM | POA: Diagnosis not present

## 2016-12-24 ENCOUNTER — Other Ambulatory Visit: Payer: Self-pay | Admitting: *Deleted

## 2016-12-24 MED ORDER — ROSUVASTATIN CALCIUM 40 MG PO TABS: ORAL_TABLET | ORAL | 3 refills | Status: DC

## 2016-12-24 NOTE — Telephone Encounter (Signed)
Patient requested to be sent to [facility].

## 2017-01-03 ENCOUNTER — Ambulatory Visit (INDEPENDENT_AMBULATORY_CARE_PROVIDER_SITE_OTHER): Payer: Medicare Other | Admitting: Cardiovascular Disease

## 2017-01-03 ENCOUNTER — Encounter: Payer: Self-pay | Admitting: Cardiovascular Disease

## 2017-01-03 VITALS — BP 130/88 | HR 66 | Ht 62.0 in | Wt 159.0 lb

## 2017-01-03 DIAGNOSIS — I716 Thoracoabdominal aortic aneurysm, without rupture, unspecified: Secondary | ICD-10-CM

## 2017-01-03 DIAGNOSIS — I6523 Occlusion and stenosis of bilateral carotid arteries: Secondary | ICD-10-CM

## 2017-01-03 DIAGNOSIS — E1122 Type 2 diabetes mellitus with diabetic chronic kidney disease: Secondary | ICD-10-CM | POA: Diagnosis not present

## 2017-01-03 DIAGNOSIS — N183 Chronic kidney disease, stage 3 unspecified: Secondary | ICD-10-CM

## 2017-01-03 DIAGNOSIS — I10 Essential (primary) hypertension: Secondary | ICD-10-CM

## 2017-01-03 DIAGNOSIS — I2510 Atherosclerotic heart disease of native coronary artery without angina pectoris: Secondary | ICD-10-CM

## 2017-01-03 DIAGNOSIS — I480 Paroxysmal atrial fibrillation: Secondary | ICD-10-CM | POA: Diagnosis not present

## 2017-01-03 DIAGNOSIS — E7800 Pure hypercholesterolemia, unspecified: Secondary | ICD-10-CM

## 2017-01-03 DIAGNOSIS — I701 Atherosclerosis of renal artery: Secondary | ICD-10-CM | POA: Diagnosis not present

## 2017-01-03 DIAGNOSIS — I739 Peripheral vascular disease, unspecified: Secondary | ICD-10-CM

## 2017-01-03 DIAGNOSIS — I272 Pulmonary hypertension, unspecified: Secondary | ICD-10-CM

## 2017-01-03 DIAGNOSIS — I5032 Chronic diastolic (congestive) heart failure: Secondary | ICD-10-CM | POA: Diagnosis not present

## 2017-01-03 NOTE — Progress Notes (Signed)
Patient ID: [name], female   DOB: 1941-05-20, 76 y.o.   MRN: [id number]    Cardiology Office Note    Date:  01/04/2017   ID:  Kendra Todd, Kendra Todd 1940-12-19, MRN [id number]  PCP:  Kendra Hubert, MD  Cardiologist:   Kendra Klein, MD   Chief Complaint  Patient presents with  . Follow-up    History of Present Illness:  Kendra Todd is a 76 y.o. female with CAD status post CABG, hypertensive heart disease with diastolic HF, severe hypertension, thoracoabdominal aortic aneurysm, recurrent carotid artery disease, hyperlipidemia,  Remote paroxysmal atrial fibrillation and pulmonary artery hypertension, chronic kidney disease stage III.  She remains in well compensated heart failure with  NYHA class II exertional dyspnea, Dyspnea at rest.   She takes clonidine on an as-needed basis for elevated blood pressure but has not needed this more frequently than at most once a week. She has been taking meloxicam again and I reminded her that this is deleterious both for her renal function and for heart failure. And also increase her risk of bleeding, especially GI bleeding. Asked her to take acetaminophen as much as possible instead, take any NSAIDs sparingly.  She denies problems with angina pectoris, dizziness, lightheadedness, syncope and has rare palpitations. She has a slightly lower blood pressure in her left upper extremity compared with the right but does not describe symptoms suggestive of claudication or steal syndrome.  Kendra Todd has extensive coronary and peripheral arterial disease. She underwent coronary bypass surgery January 2014, after having previously undergone stents to the right coronary artery in 2002 and several angioplasty procedures before that. Echocardiogram performed in January 2017 showed severe pulmonary artery hypertension (estimated systolic PA pressure 73 mmHg) in the setting of pseudo-normal mitral inflow consistent with grade 2 diastolic dysfunction/elevated left atrial  filling pressures. Moderate mitral regurgitation was also present  She has undergone bilateral carotid endarterectomies and has recurrent stenoses. Of her surgeries was complicated by a dissection flap and a stroke, from which she has recovered. She has a moderate size type 2 thoracoabdominal aortic aneurysm (recent reimaging with Dr. Oneida Alar shows a stable diameter of 4 cm).   She has previously undergone TEE guided cardioversion for paroxysmal atrial fibrillation (2012) Had postoperative atrial fibrillation after bypass surgery. Anticoagulants were stopped due to gastrointestinal bleeding that required transfusion. She takes aspirin. She was poorly tolerant to amiodarone (nausea, anorexia, 20 lb weight loss).  She has severe systemic hypertension and it has taken a long time to find a regimen that controls it.  Finally seemed to have settled on a combination of high-dose carvedilol, Clonidine, valsartan/hydrochlorothiazide. She has moderate bilateral renal artery stenosis at angiography January 2015, not felt to be severe enough to cause secondary hypertension.   Past Medical History:  Diagnosis Date  . Abdominal pain, epigastric   . Acute upper respiratory infections of unspecified site   . Arthritis   . Atrial fibrillation ([place]) 02/21/2011  . Benign paroxysmal positional vertigo   . CAD (coronary artery disease)    prior coronary stenting  . Candidiasis of vulva and vagina   . Carotid artery disease ([place]) 2001   s/p Bilateral CEA, after CVA  . Cervicalgia   . Decreased pedal pulses 06/04/2011   doppler - bilateral ABIs no evidence of insufficiency; L CIA slightly elevated velocities 20-30% diameter reduction;   . Dyspnea 03/13/2006   Myoview - EF 76%; mild ischemia in apical lateral region, less severe than previous study in 2002  .  Edema   . H/O carotid endarterectomy 03/06/2012   CT angio -high grade stenosis of proximal R internal carotid w/ lg posterior ulceration or dissection flap;  near occlusive stenosis at origin of nondominantproximal L vertebral artery; <50% stenosis of proximal R vertebral and proximal L subclavian artery  . H/O endarterectomy 09/11/2012   patent L and R carotid sites, R 60-79% restenosed ICA; L <40% restenosed ICA  . H/O endarterectomy 01/08/2012   doppler - R distal common carotid/proximal ICA stenosed 80-99%, L 0-39%  . HTN (hypertension) 11/27/2012  . Hyperlipidemia   . Hypertension   . Limb pain 05/02/2011   doppler - no evidence of thrombus of thrombophlebitis  . Long term (current) use of anticoagulants   . Loss of weight   . Lumbago   . Myalgia and myositis, unspecified   . Myocardial infarction   . Nonspecific abnormal results of liver function study   . Nontoxic uninodular goiter   . Osteoarthrosis, unspecified whether generalized or localized, unspecified site   . Other malaise and fatigue   . Other manifestations of vitamin A deficiency   . Pain in joint, pelvic region and thigh   . Pain in limb   . Peripheral vascular disease ([place])   . Phlebitis and thrombophlebitis of superficial vessels of lower extremities   . Primary localized osteoarthrosis, hand   . Pulmonary HTN 12/03/2012  . Renal artery stenosis ([place])   . Routine gynecological examination   . Spinal stenosis, unspecified region other than cervical   . Stenosis of artery ([place]) 06/03/2012   doppler- most distal aspect of abd aorta no aneurysmal dilation; bilateral ABIs - mild L arterial insufficiency, L CIA narrowing w/ 50-69% diameter reduction; L and R SFA both w/ 0-49% diameter reductions  . Stroke ([employer]) 2001  . Superficial vein thrombosis 05/11/2015   Left forehead  . Tobacco use disorder   . TR (tricuspid regurgitation), severe 12/02/12 for repair with CABG 12/03/2012  . Type II or unspecified type diabetes mellitus with peripheral circulatory disorders, not stated as uncontrolled(250.70)   . Unspecified cataract   . Unspecified disorder of iris and ciliary body   .  Unspecified vitamin D deficiency   . Urinary tract infection, site not specified     Past Surgical History:  Procedure Laterality Date  . ABDOMINAL HYSTERECTOMY  1975  . ANGIOPLASTY  1984 and 1982  . APPENDECTOMY  1954  . ARCH AORTOGRAM  03/30/2011   40% recurrent stenosis at origin of R proximal carotid patch, shelf like recurrent stenosis w/in midsection of patch that does not appear to be flow limiting; normal non-stenosed great vessel origins  . BACK SURGERY  '84,'86, '87   three previous surgeries  . CAROTID ENDARTERECTOMY  2001   Bilateral  . CORONARY ARTERY BYPASS GRAFT  12/04/2012   Procedure: CORONARY ARTERY BYPASS GRAFTING (CABG);  Surgeon: Ivin Poot, MD; LIMA-LAD, SVG-OM, SVG-RCA  . INTRAOPERATIVE TRANSESOPHAGEAL ECHOCARDIOGRAM  12/04/2012   Procedure: INTRAOPERATIVE TRANSESOPHAGEAL ECHOCARDIOGRAM;  Surgeon: Ivin Poot, MD;  Location: [facility];  Service: Open Heart Surgery;  Laterality: N/A;  . KIDNEY STONE SURGERY     Removal  . LEFT HEART CATHETERIZATION WITH CORONARY ANGIOGRAM N/A 11/28/2012   Procedure: LEFT HEART CATHETERIZATION WITH CORONARY ANGIOGRAM;  Surgeon: Leonie Man, MD;  Location: [facility] CATH LAB;  Service: Cardiovascular;  Laterality: N/A;  . PERCUTANEOUS CORONARY STENT INTERVENTION (PCI-S)  2002   2 BMS in ostial/Prox & mid RCA (mid 2.5 mm 20x  mm, &  2.75 mm x  8 mm ostial)  . RENAL ANGIOGRAM N/A 11/30/2013   Procedure: RENAL ANGIOGRAM;  Surgeon: Lorretta Harp, MD;  Location: [facility] CATH LAB;  Service: Cardiovascular;  Laterality: N/A;  . RIGHT HEART CATHETERIZATION N/A 12/02/2012   Procedure: RIGHT HEART CATH;  Surgeon: Troy Sine, MD;  Location: [facility] CATH LAB;  Service: Cardiovascular;  Laterality: N/A;  . TEE WITH CARDIOVERSION  01/23/2011   EF 99991111; grade 2 diastolic dysfunction, elevated mean LA filling pressure, RV systolic pressure increased consistent w/ mod pulmonary hypertension    Current Medications: Outpatient Medications Prior to Visit    Medication Sig Dispense Refill  . alendronate (FOSAMAX) 70 MG tablet Take one tablet daily for bones    . aspirin 81 MG tablet Take 81 mg by mouth daily.     . carvedilol (COREG) 25 MG tablet Take 0.5 tablets (12.5 mg total) by mouth 2 (two) times daily with a meal. 180 tablet 3  . cloNIDine (CATAPRES) 0.1 MG tablet Take 0.1 mg by mouth as needed (SYSTOLIC BP > [id number]).    [name] LUMIGAN 0.01 % SOLN Place 1 drop into both eyes at bedtime.     . meloxicam (MOBIC) 7.5 MG tablet Take 7.5 mg by mouth daily. FOR ARTHRITIS    . metFORMIN (GLUCOPHAGE) 1000 MG tablet TAKE 1 TABLET TWICE A DAY WITH MEALS 180 tablet 1  . nitroGLYCERIN (NITROSTAT) 0.4 MG SL tablet Place 0.4 mg under the tongue every 5 (five) minutes as needed for chest pain.    . rosuvastatin (CRESTOR) 40 MG tablet Take one tablet by mouth once daily for cholesterol 90 tablet 3  . Vitamin D, Ergocalciferol, (DRISDOL) 50000 units CAPS capsule Take 1 capsule by mouth once a week.     No facility-administered medications prior to visit.      Allergies:   Iron; Vicodin [hydrocodone-acetaminophen]; Amiodarone; Norvasc [amlodipine besylate]; Promethazine; and Vioxx [rofecoxib]   Social History   Social History  . Marital status: Widowed    Spouse name: N/A  . Number of children: N/A  . Years of education: N/A   Social History Main Topics  . Smoking status: Former Smoker    Packs/day: 0.30    Years: 57.00    Types: Cigarettes    Quit date: 11/19/2012  . Smokeless tobacco: Never Used  . Alcohol use 0.6 oz/week    1 Glasses of wine per week  . Drug use: No  . Sexual activity: No   Other Topics Concern  . None   Social History Narrative  . None     Family History:  The patient's family history includes Cancer in her other and sister; Diabetes in her brother, daughter, mother, other, and sister; Heart attack in her father and mother; Heart disease in her brother, brother, brother, father, mother, other, sister, sister, and sister;  Other in her mother and other; Stroke in her brother, brother, and brother.   ROS:   Please see the history of present illness.    ROS All other systems reviewed and are negative.   PHYSICAL EXAM:   VS:  BP 130/88 (BP Location: Right Arm, Patient Position: Sitting, Cuff Size: Normal)   Pulse 66   Ht 5\' 2"  (1.575 m)   Wt 72.1 kg (159 lb)   BMI 29.08 kg/m    Recheck blood pressure right arm 150/81 mmHg, left arm 138/79 mmHg. GEN: Well nourished, well developed, in no acute distress  HEENT: normal  Neck: 2-4 cm JVD, bilateral  endarterectomy scars and bilateral carotid bruits, No goiter or masses Cardiac: RRR; no murmurs, rubs, or gallops,no edema  Respiratory:  clear to auscultation bilaterally, normal work of breathing GI: soft, nontender, nondistended, + BS MS: no deformity or atrophy  Skin: warm and dry, no rash Neuro:  Alert and Oriented x 3, Strength and sensation are intact Psych: euthymic mood, full affect  Wt Readings from Last 3 Encounters:  01/03/17 72.1 kg (159 lb)  12/12/16 72.6 kg (160 lb)  12/12/16 72.9 kg (160 lb 12.8 oz)      Studies/Labs Reviewed:   EKG:  EKG is ordered today.  Shows sinus rhythm With a single PAC   Recent Labs: 09/11/2016: Magnesium 2.1 12/07/2016: ALT 8; BUN 25; Creat 1.32; Potassium 4.5; Sodium 136   Lipid Panel    Component Value Date/Time   CHOL 145 12/07/2016 0001   CHOL 159 02/06/2016 0831   TRIG 63 12/07/2016 0001   HDL 50 (L) 12/07/2016 0001   HDL 61 02/06/2016 0831   CHOLHDL 2.9 12/07/2016 0001   VLDL 13 12/07/2016 0001   LDLCALC 82 12/07/2016 0001   LDLCALC 88 02/06/2016 0831     ASSESSMENT:    1. Chronic diastolic CHF (congestive heart failure), NYHA class 2 ([place])   2. Coronary artery disease involving native coronary artery of native heart without angina pectoris   3. Bilateral carotid artery stenosis   4. Thoracoabdominal aortic aneurysm, without rupture ([place])   5. Renal artery stenosis ([place])   6. PAD (peripheral  artery disease) ([place])   7. Pure hypercholesterolemia   8. Paroxysmal atrial fibrillation (H[facility])   9. Essential hypertension   10. Pulmonary HTN   11. Type 2 diabetes mellitus with stage 3 chronic kidney disease, without long-term current use of insulin ([place])   12. CKD (chronic kidney disease) stage 3, GFR 30-59 ml/min      PLAN:  In order of problems listed above:  1. CHF: Weight is steady. NYHA functional class II. Reviewed competing priorities for management of CHF and renal insufficiency. Reminded her of the importance of daily weight monitoring and sodium restriction. Still have the option for right and left heart catheterization if we cannot achieve a satisfactory balance between renal dysfunction and heart failure.  2. CAD S/P CABG - No symptoms of angina pectoris. (1993 PTCA to circumflex. 2002 2XBMS to ostial RCA, S/P CABG x 3 12/04/12) 3. Carotid disease - has known significant restenosis in the right carotid, asymptomatic. Would be a candidate for carotid stent if this requires another revascularization. 4. Thoracoabdominal aortic aneurysm - stable dimensions by CT scans performed 6 months apart. Avoid frequent contrast based imaging studies if possible. 5. RAS - felt to not be severe enough to cause hypertension 6. PAD - ankle-brachial indices actually look pretty good. She does not report claudication. Doppler study in July of 2013 showed a 50-69% stenosis in the left common iliac artery there was three-vessel runoff in the calf on the right side there was two-vessel runoff to 2 occlusion of the anterior tibial artery with distal reconstitution. 7. HLP - on maximum dose of our most potent statin her LDL was still 88. Ideally would like to drive LDL to less than 70. She is not interested in PCS K9 inhibitors. Her current LDL cholesterol is roughly 60% less than her baseline. No change made to current meds. 8. PAF - no symptomatic recurrence in years; anticoagulation stopped due to severe  GI bleeding. Note poorly tolerated amiodarone therapy in the  past. Embolic risk remains very high, although her only stroke in the past was related to complications of carotid surgery rather than an embolic event. Even discounting her stroke, CHADSVasc score is 11 (age 39, gender, HTN, vascular disease, CHF, DM). If atrial fibrillation is detected again, recommend Watchman device 9. HTN-  her blood pressure is staying in the appropriate range. Always check blood pressure in right arm. She appears to have at least mild left subclavian artery stenosis. 10. PAH - this is probably secondary to diastolic left ventricular dysfunction. Cardiac catheterization was offered, but she declined because she is worried about mechanical complications, as had occurred in the past. 11. Mild diabetes mellitus on metformin monotherapy 12. Chronic kidney disease stage III, GFR approximately 30  Medication Adjustments/Labs and Tests Ordered: Current medicines are reviewed at length with the patient today.  Concerns regarding medicines are outlined above.  Medication changes, Labs and Tests ordered today are listed in the Patient Instructions below. Patient Instructions  Dr [name] recommends that you schedule a follow-up appointment in 6 months. You will receive a reminder letter in the mail two months in advance. If you don't receive a letter, please call our office to schedule the follow-up appointment.  If you need a refill on your cardiac medications before your next appointment, please call your pharmacy.    Signed, Kendra Klein, MD  01/04/2017 4:35 PM    [facility] [address], [place], [place]  [address] Phone: [phone]; Fax: [phone]

## 2017-01-03 NOTE — Patient Instructions (Signed)
Dr Croitoru recommends that you schedule a follow-up appointment in 6 months. You will receive a reminder letter in the mail two months in advance. If you don't receive a letter, please call our office to schedule the follow-up appointment.  If you need a refill on your cardiac medications before your next appointment, please call your pharmacy. 

## 2017-01-16 DIAGNOSIS — I4891 Unspecified atrial fibrillation: Secondary | ICD-10-CM | POA: Diagnosis not present

## 2017-01-16 DIAGNOSIS — Z6829 Body mass index (BMI) 29.0-29.9, adult: Secondary | ICD-10-CM | POA: Diagnosis not present

## 2017-01-16 DIAGNOSIS — I272 Pulmonary hypertension, unspecified: Secondary | ICD-10-CM | POA: Diagnosis not present

## 2017-01-16 DIAGNOSIS — I129 Hypertensive chronic kidney disease with stage 1 through stage 4 chronic kidney disease, or unspecified chronic kidney disease: Secondary | ICD-10-CM | POA: Diagnosis not present

## 2017-01-16 DIAGNOSIS — I739 Peripheral vascular disease, unspecified: Secondary | ICD-10-CM | POA: Diagnosis not present

## 2017-01-16 DIAGNOSIS — I5032 Chronic diastolic (congestive) heart failure: Secondary | ICD-10-CM | POA: Diagnosis not present

## 2017-01-16 DIAGNOSIS — Z9889 Other specified postprocedural states: Secondary | ICD-10-CM | POA: Diagnosis not present

## 2017-01-16 DIAGNOSIS — I2510 Atherosclerotic heart disease of native coronary artery without angina pectoris: Secondary | ICD-10-CM | POA: Diagnosis not present

## 2017-01-16 DIAGNOSIS — N179 Acute kidney failure, unspecified: Secondary | ICD-10-CM | POA: Diagnosis not present

## 2017-01-28 DIAGNOSIS — N179 Acute kidney failure, unspecified: Secondary | ICD-10-CM | POA: Diagnosis not present

## 2017-02-21 DIAGNOSIS — H401131 Primary open-angle glaucoma, bilateral, mild stage: Secondary | ICD-10-CM | POA: Diagnosis not present

## 2017-02-21 DIAGNOSIS — H2513 Age-related nuclear cataract, bilateral: Secondary | ICD-10-CM | POA: Diagnosis not present

## 2017-02-27 DIAGNOSIS — Z9889 Other specified postprocedural states: Secondary | ICD-10-CM | POA: Diagnosis not present

## 2017-02-27 DIAGNOSIS — I5032 Chronic diastolic (congestive) heart failure: Secondary | ICD-10-CM | POA: Diagnosis not present

## 2017-02-27 DIAGNOSIS — N179 Acute kidney failure, unspecified: Secondary | ICD-10-CM | POA: Diagnosis not present

## 2017-02-27 DIAGNOSIS — I129 Hypertensive chronic kidney disease with stage 1 through stage 4 chronic kidney disease, or unspecified chronic kidney disease: Secondary | ICD-10-CM | POA: Diagnosis not present

## 2017-02-27 DIAGNOSIS — N183 Chronic kidney disease, stage 3 (moderate): Secondary | ICD-10-CM | POA: Diagnosis not present

## 2017-02-27 DIAGNOSIS — I701 Atherosclerosis of renal artery: Secondary | ICD-10-CM | POA: Diagnosis not present

## 2017-03-07 ENCOUNTER — Encounter (HOSPITAL_COMMUNITY): Payer: Medicare Other

## 2017-03-07 ENCOUNTER — Ambulatory Visit: Payer: Medicare Other | Admitting: Vascular Surgery

## 2017-04-10 ENCOUNTER — Encounter: Payer: Self-pay | Admitting: Internal Medicine

## 2017-04-10 ENCOUNTER — Ambulatory Visit (INDEPENDENT_AMBULATORY_CARE_PROVIDER_SITE_OTHER): Payer: Medicare Other | Admitting: Internal Medicine

## 2017-04-10 VITALS — BP 130/86 | HR 65 | Temp 98.1°F | Ht 62.0 in | Wt 156.0 lb

## 2017-04-10 DIAGNOSIS — L299 Pruritus, unspecified: Secondary | ICD-10-CM | POA: Diagnosis not present

## 2017-04-10 DIAGNOSIS — I701 Atherosclerosis of renal artery: Secondary | ICD-10-CM

## 2017-04-10 DIAGNOSIS — M461 Sacroiliitis, not elsewhere classified: Secondary | ICD-10-CM

## 2017-04-10 DIAGNOSIS — E7800 Pure hypercholesterolemia, unspecified: Secondary | ICD-10-CM | POA: Diagnosis not present

## 2017-04-10 DIAGNOSIS — N183 Chronic kidney disease, stage 3 unspecified: Secondary | ICD-10-CM

## 2017-04-10 DIAGNOSIS — I2510 Atherosclerotic heart disease of native coronary artery without angina pectoris: Secondary | ICD-10-CM

## 2017-04-10 DIAGNOSIS — I6523 Occlusion and stenosis of bilateral carotid arteries: Secondary | ICD-10-CM

## 2017-04-10 DIAGNOSIS — E1122 Type 2 diabetes mellitus with diabetic chronic kidney disease: Secondary | ICD-10-CM | POA: Diagnosis not present

## 2017-04-10 DIAGNOSIS — I10 Essential (primary) hypertension: Secondary | ICD-10-CM | POA: Diagnosis not present

## 2017-04-10 NOTE — Progress Notes (Signed)
Facility  [facility]    Place of Service:   OFFICE    Allergies  Allergen Reactions  . Iron Hives  . Vicodin [Hydrocodone-Acetaminophen] Other (See Comments)    Caused arrythmia   . Amiodarone Nausea And Vomiting  . Norvasc [Amlodipine Besylate] Nausea And Vomiting  . Promethazine Other (See Comments)    hallucinations  . Vioxx [Rofecoxib] Nausea And Vomiting    Chief Complaint  Patient presents with  . Medical Management of Chronic Issues    4 month medication management blood sugar, cholesterol, blood pressure, CKD    HPI:  Did not get lab prior to this visit.  Coronary artery disease involving native coronary artery of native heart without angina pectoris - no angina or palpitations. Has routiine cardiology appt soon.  CKD (chronic kidney disease) stage 3, GFR 30-59 ml/min - return for lab  Type 2 diabetes mellitus with stage 3 chronic kidney disease, without long-term current use of insulin (H[facility]) - returrn for lab  Pure hypercholesterolemia - return for lab  Essential hypertension - controlled  Bilateral carotid artery stenosis - has follow up with [facility] in July 2018.  Sacroiliitis ([facility]) - tender in both SI joints and in the groin bilaterally. Has appt to see Dr. Dossie Der, rheumatologist, next week.  Pruritus - itching at her sternal scar. No injury, rash, pustule, etc    Medications: Patient's Medications  New Prescriptions   No medications on file  Previous Medications   ALENDRONATE (FOSAMAX) 70 MG TABLET    Take one tablet daily for bones   ASPIRIN 81 MG TABLET    Take 81 mg by mouth daily.    CARVEDILOL (COREG) 25 MG TABLET    Take 0.5 tablets (12.5 mg total) by mouth 2 (two) times daily with a meal.   CLONIDINE (CATAPRES) 0.1 MG TABLET    Take 0.1 mg by mouth as needed (SYSTOLIC BP > 382).   LUMIGAN 0.01 % SOLN    Place 1 drop into both eyes at bedtime.    METFORMIN (GLUCOPHAGE) 1000 MG TABLET    TAKE 1 TABLET TWICE A DAY WITH MEALS   NITROGLYCERIN  (NITROSTAT) 0.4 MG SL TABLET    Place 0.4 mg under the tongue every 5 (five) minutes as needed for chest pain.   ROSUVASTATIN (CRESTOR) 40 MG TABLET    Take one tablet by mouth once daily for cholesterol   VITAMIN D, ERGOCALCIFEROL, (DRISDOL) 50000 UNITS CAPS CAPSULE    Take 1 capsule by mouth once a week.  Modified Medications   No medications on file  Discontinued Medications   MELOXICAM (MOBIC) 7.5 MG TABLET    Take 7.5 mg by mouth daily. FOR ARTHRITIS    Review of Systems  Constitutional: Negative for activity change, appetite change, chills, diaphoresis, fatigue, fever and unexpected weight change.  HENT: Negative for congestion, ear discharge, ear pain, hearing loss, postnasal drip, rhinorrhea, sore throat, tinnitus, trouble swallowing and voice change.   Eyes: Negative for pain, redness, itching and visual disturbance (corrective lenses).  Respiratory: Positive for shortness of breath (with exertion). Negative for cough, choking and wheezing.   Cardiovascular: Positive for leg swelling. Negative for chest pain and palpitations.       Hx of AF; currently in NSR  Gastrointestinal: Negative for abdominal distention, abdominal pain, constipation, diarrhea and nausea.  Endocrine: Negative for cold intolerance, heat intolerance, polydipsia, polyphagia and polyuria.        diabetes  Genitourinary: Positive for frequency and urgency. Negative for  difficulty urinating, dysuria, flank pain, genital sores, hematuria, pelvic pain and vaginal discharge.  Musculoskeletal: Positive for arthralgias and back pain. Negative for gait problem, myalgias, neck pain and neck stiffness.  Skin: Negative for color change, pallor and rash.       Itching at the sternal scar.  Allergic/Immunologic: Negative.   Neurological: Negative for dizziness, tremors, seizures, syncope, weakness, numbness and headaches.       Intermittent numbness, tingking, and discomfort in both feet  Hematological: Negative for  adenopathy. Does not bruise/bleed easily.  Psychiatric/Behavioral: Negative for agitation, behavioral problems, confusion, dysphoric mood, hallucinations, sleep disturbance and suicidal ideas. The patient is not nervous/anxious and is not hyperactive.     Vitals:   04/10/17 1241  BP: 130/86  Pulse: 65  Temp: 98.1 F (36.7 C)  TempSrc: Oral  SpO2: 95%  Weight: 156 lb (70.8 kg)  Height: 5' 2" (1.575 m)   Body mass index is 28.53 kg/m. Wt Readings from Last 3 Encounters:  04/10/17 156 lb (70.8 kg)  01/03/17 159 lb (72.1 kg)  12/12/16 160 lb (72.6 kg)      Physical Exam  Constitutional: She is oriented to person, place, and time. She appears well-developed and well-nourished. No distress.  HENT:  Head: Normocephalic.  Nose: Nose normal.  Upper and lower dentures present.  Eyes: Conjunctivae are normal. Pupils are equal, round, and reactive to light.  Wears prescription lenses. Right cataract.  Neck: Normal range of motion. Neck supple. No JVD present. No tracheal deviation present. No thyromegaly present.  1/4 right carotid bruit  Cardiovascular: Normal rate, regular rhythm and normal heart sounds.  Exam reveals no gallop and no friction rub.   No murmur heard. Diminished dorsalis pedis pulses bilaterally and diminished posterior tibial pulses bilaterally.  Pulmonary/Chest: Effort normal and breath sounds normal. No respiratory distress. She has no wheezes. She has no rales. She exhibits no tenderness.  Abdominal: Soft. Bowel sounds are normal. She exhibits no distension and no mass. There is no tenderness.  Musculoskeletal: She exhibits tenderness. She exhibits no edema.  Pain in multiple joints  Lymphadenopathy:    She has no cervical adenopathy.  Neurological: She is alert and oriented to person, place, and time. She has normal reflexes. No cranial nerve deficit. Coordination normal.  Diminished sensation to vibration and monofilament testing  Skin: Skin is warm and dry.  No rash noted. No erythema. No pallor.  Resolution of prior rash on the left chest wall.  Psychiatric: She has a normal mood and affect. Her behavior is normal. Judgment normal.    Labs reviewed: Lab Summary Latest Ref Rng & Units 12/07/2016 09/25/2016 09/11/2016 08/06/2016  Hemoglobin 13.0-17.0  (None) (None) (None) (None)  Hematocrit 39.0-52.0  (None) (None) (None) (None)  White count - (None) (None) (None) (None)  Platelet count - (None) (None) (None) (None)  Sodium 135 - 146 mmol/L 136 136 136 136  Potassium 3.5 - 5.3 mmol/L 4.5 5.0 4.8 4.9  Calcium 8.6 - 10.4 mg/dL 9.1 9.5 9.4 9.3  Phosphorus - (None) (None) (None) (None)  Creatinine 0.60 - 0.93 mg/dL 1.32(H) 2.64(H) 1.98(H) 1.59(H)  AST 10 - 35 U/L 15 (None) (None) (None)  Alk Phos 33 - 130 U/L 38 (None) (None) (None)  Bilirubin 0.2 - 1.2 mg/dL 0.9 (None) (None) (None)  Glucose 65 - 99 mg/dL 207(H) 92 116(H) 115(H)  Cholesterol <200 mg/dL 145 (None) (None) 187  HDL cholesterol >50 mg/dL 50(L) (None) (None) 48  Triglycerides <150 mg/dL 63 (None) (None) 92  LDL Direct - (None) (None) (None) (None)  LDL Calc <100 mg/dL 82 (None) (None) 121  Total protein 6.1 - 8.1 g/dL 6.5 (None) (None) (None)  Albumin 3.6 - 5.1 g/dL 3.9 (None) (None) (None)  Some recent data might be hidden   Lab Results  Component Value Date   TSH 1.436 11/24/2013   TSH 2.654 11/28/2012   TSH 0.990 01/22/2011   Lab Results  Component Value Date   BUN 25 12/07/2016   BUN 60 (H) 09/25/2016   BUN 34 (H) 09/11/2016   Lab Results  Component Value Date   HGBA1C 5.7 (H) 12/07/2016   HGBA1C 6.6 (H) 08/06/2016   HGBA1C 6.5 (H) 02/06/2016    Assessment/Plan  1. Coronary artery disease involving native coronary artery of native heart without angina pectoris The current medical regimen is effective;  continue present plan and medications.  2. CKD (chronic kidney disease) stage 3, GFR 30-59 ml/min -CMP  3. Type 2 diabetes mellitus with stage 3 chronic  kidney disease, without long-term current use of insulin (HCC) -A1c, CMP  4. Pure hypercholesterolemia -lipids  5. Essential hypertension The current medical regimen is effective;  continue present plan and medications.  6. Bilateral carotid artery stenosis -has carotid Doppleer scheduled  7. Sacroiliitis (HCC) -to see Dr. Dossie Der  8. Pruritus -try 1% hydrocortisone cream to scar daily

## 2017-04-15 ENCOUNTER — Other Ambulatory Visit: Payer: Medicare Other

## 2017-04-15 DIAGNOSIS — I10 Essential (primary) hypertension: Secondary | ICD-10-CM | POA: Diagnosis not present

## 2017-04-15 DIAGNOSIS — N183 Chronic kidney disease, stage 3 unspecified: Secondary | ICD-10-CM

## 2017-04-15 DIAGNOSIS — E1122 Type 2 diabetes mellitus with diabetic chronic kidney disease: Secondary | ICD-10-CM

## 2017-04-15 DIAGNOSIS — E7800 Pure hypercholesterolemia, unspecified: Secondary | ICD-10-CM

## 2017-04-15 LAB — LIPID PANEL
CHOL/HDL RATIO: 3 ratio (ref <5.0)
Cholesterol: 164 mg/dL (ref <200)
HDL: 55 mg/dL (ref >50)
LDL CALC: 95 mg/dL (ref <100)
TRIGLYCERIDES: 68 mg/dL (ref <150)
VLDL: 14 mg/dL (ref <30)

## 2017-04-15 LAB — COMPREHENSIVE METABOLIC PANEL
ALBUMIN: 3.6 g/dL (ref 3.6–5.1)
ALT: 6 U/L (ref 6–29)
AST: 14 U/L (ref 10–35)
Alkaline Phosphatase: 38 U/L (ref 33–130)
BUN: 23 mg/dL (ref 7–25)
CALCIUM: 9.1 mg/dL (ref 8.6–10.4)
CHLORIDE: 102 mmol/L (ref 98–110)
CO2: 27 mmol/L (ref 20–31)
Creat: 1.38 mg/dL — ABNORMAL HIGH (ref 0.60–0.93)
GLUCOSE: 91 mg/dL (ref 65–99)
Potassium: 4.7 mmol/L (ref 3.5–5.3)
Sodium: 138 mmol/L (ref 135–146)
Total Bilirubin: 0.6 mg/dL (ref 0.2–1.2)
Total Protein: 6.7 g/dL (ref 6.1–8.1)

## 2017-04-15 LAB — MICROALBUMIN, URINE: Microalb, Ur: 2.9 mg/dL

## 2017-04-16 LAB — HEMOGLOBIN A1C
HEMOGLOBIN A1C: 6.4 % — AB (ref <5.7)
Mean Plasma Glucose: 137 mg/dL

## 2017-04-21 ENCOUNTER — Other Ambulatory Visit: Payer: Self-pay | Admitting: Cardiovascular Disease

## 2017-04-29 DIAGNOSIS — H401131 Primary open-angle glaucoma, bilateral, mild stage: Secondary | ICD-10-CM | POA: Diagnosis not present

## 2017-04-29 DIAGNOSIS — H2511 Age-related nuclear cataract, right eye: Secondary | ICD-10-CM | POA: Diagnosis not present

## 2017-04-29 DIAGNOSIS — H2513 Age-related nuclear cataract, bilateral: Secondary | ICD-10-CM | POA: Diagnosis not present

## 2017-04-29 DIAGNOSIS — H25811 Combined forms of age-related cataract, right eye: Secondary | ICD-10-CM | POA: Diagnosis not present

## 2017-04-30 DIAGNOSIS — H401131 Primary open-angle glaucoma, bilateral, mild stage: Secondary | ICD-10-CM | POA: Diagnosis not present

## 2017-04-30 DIAGNOSIS — H2513 Age-related nuclear cataract, bilateral: Secondary | ICD-10-CM | POA: Diagnosis not present

## 2017-05-16 DIAGNOSIS — H401131 Primary open-angle glaucoma, bilateral, mild stage: Secondary | ICD-10-CM | POA: Diagnosis not present

## 2017-05-24 ENCOUNTER — Encounter: Payer: Self-pay | Admitting: Vascular Surgery

## 2017-05-30 ENCOUNTER — Encounter: Payer: Self-pay | Admitting: Vascular Surgery

## 2017-05-30 ENCOUNTER — Ambulatory Visit (INDEPENDENT_AMBULATORY_CARE_PROVIDER_SITE_OTHER): Payer: Medicare Other | Admitting: Vascular Surgery

## 2017-05-30 ENCOUNTER — Ambulatory Visit (HOSPITAL_COMMUNITY)
Admission: RE | Admit: 2017-05-30 | Discharge: 2017-05-30 | Disposition: A | Discharge status: Home / Self Care | Payer: Medicare Other | Source: Ambulatory Visit | Attending: Vascular Surgery | Admitting: Vascular Surgery

## 2017-05-30 VITALS — BP 157/82 | HR 56 | Temp 98.2°F | Resp 20 | Ht 62.0 in | Wt 158.5 lb

## 2017-05-30 DIAGNOSIS — I739 Peripheral vascular disease, unspecified: Secondary | ICD-10-CM

## 2017-05-30 DIAGNOSIS — I6523 Occlusion and stenosis of bilateral carotid arteries: Secondary | ICD-10-CM | POA: Insufficient documentation

## 2017-05-30 DIAGNOSIS — I701 Atherosclerosis of renal artery: Secondary | ICD-10-CM

## 2017-05-30 LAB — VAS US CAROTID
LCCAPDIAS: 7 cm/s
LEFT ECA DIAS: -7 cm/s
LICADDIAS: -13 cm/s
LICADSYS: -68 cm/s
LICAPDIAS: -7 cm/s
LICAPSYS: -30 cm/s
Left CCA dist dias: -8 cm/s
Left CCA dist sys: -55 cm/s
Left CCA prox sys: 58 cm/s
RIGHT CCA MID DIAS: 14 cm/s
RIGHT ECA DIAS: -25 cm/s
Right CCA prox dias: 13 cm/s
Right CCA prox sys: 46 cm/s
Right cca dist sys: -84 cm/s

## 2017-05-30 NOTE — Progress Notes (Signed)
HISTORY AND PHYSICAL     CC:  Follow up carotid duplex Requesting Provider:  Estill Dooms, MD  HPI: This is a 76 y.o. female who presents today for follow up carotid duplex.  She is s/p bilateral carotid endarterectomy by Dr. Amedeo Plenty October 2001 and re-exploration of left carotid endarterectomy in August 2001 by Dr. Donnetta Hutching.  She denies any amaurosis fugax, speech difficulty or unilateral weakness.  She states that she does have some numbness in her right leg that has been present since the vein harvest for her open heart surgery.  She sometimes favors the left leg b/c of this.  She denies any dizziness.    She states she does get some cramping in her calves when she walks.  She can only walk a short distance if she is going uphill.  She can walk a fair distance going downhill.  She denies any non healing wounds on her feet.  ABI's in February 2017 were 1.0 right and 0.94 left.  She does have shortness of breath with activity and she is followed for heart failure by cardiology.    She also has a TAA.  She denies any chest pain or stabbing back pain.  She is scheduled to have a repeat CTA chest/abdomen/pelvis in one year.  She is being treated for her hypertension and is closely followed by cardiology for this as well.    The pt is on a statin for cholesterol management.  She is on a daily aspirin.   She is on a beta blocker and clonidine for blood pressure management.   She has a remote tobacco hx - quit 2013.    Past Medical History:  Diagnosis Date  . Abdominal pain, epigastric   . Acute upper respiratory infections of unspecified site   . Arthritis   . Atrial fibrillation ([place]) 02/21/2011  . Benign paroxysmal positional vertigo   . CAD (coronary artery disease)    prior coronary stenting  . Candidiasis of vulva and vagina   . Carotid artery disease ([place]) 2001   s/p Bilateral CEA, after CVA  . Cervicalgia   . Decreased pedal pulses 06/04/2011   doppler - bilateral ABIs no evidence of  insufficiency; L CIA slightly elevated velocities 20-30% diameter reduction;   . Dyspnea 03/13/2006   Myoview - EF 76%; mild ischemia in apical lateral region, less severe than previous study in 2002  . Edema   . H/O carotid endarterectomy 03/06/2012   CT angio -high grade stenosis of proximal R internal carotid w/ lg posterior ulceration or dissection flap; near occlusive stenosis at origin of nondominantproximal L vertebral artery; <50% stenosis of proximal R vertebral and proximal L subclavian artery  . H/O endarterectomy 09/11/2012   patent L and R carotid sites, R 60-79% restenosed ICA; L <40% restenosed ICA  . H/O endarterectomy 01/08/2012   doppler - R distal common carotid/proximal ICA stenosed 80-99%, L 0-39%  . HTN (hypertension) 11/27/2012  . Hyperlipidemia   . Hypertension   . Limb pain 05/02/2011   doppler - no evidence of thrombus of thrombophlebitis  . Long term (current) use of anticoagulants   . Loss of weight   . Lumbago   . Myalgia and myositis, unspecified   . Myocardial infarction ([place])   . Nonspecific abnormal results of liver function study   . Nontoxic uninodular goiter   . Osteoarthrosis, unspecified whether generalized or localized, unspecified site   . Other malaise and fatigue   . Other manifestations of vitamin  A deficiency   . Pain in joint, pelvic region and thigh   . Pain in limb   . Peripheral vascular disease ([place])   . Phlebitis and thrombophlebitis of superficial vessels of lower extremities   . Primary localized osteoarthrosis, hand   . Pulmonary HTN ([place]) 12/03/2012  . Renal artery stenosis ([place])   . Routine gynecological examination   . Spinal stenosis, unspecified region other than cervical   . Stenosis of artery ([place]) 06/03/2012   doppler- most distal aspect of abd aorta no aneurysmal dilation; bilateral ABIs - mild L arterial insufficiency, L CIA narrowing w/ 50-69% diameter reduction; L and R SFA both w/ 0-49% diameter reductions  . Stroke ([place]) 2001   . Superficial vein thrombosis 05/11/2015   Left forehead  . Tobacco use disorder   . TR (tricuspid regurgitation), severe 12/02/12 for repair with CABG 12/03/2012  . Type II or unspecified type diabetes mellitus with peripheral circulatory disorders, not stated as uncontrolled(250.70)   . Unspecified cataract   . Unspecified disorder of iris and ciliary body   . Unspecified vitamin D deficiency   . Urinary tract infection, site not specified     Past Surgical History:  Procedure Laterality Date  . ABDOMINAL HYSTERECTOMY  1975  . ANGIOPLASTY  1984 and 1982  . APPENDECTOMY  1954  . ARCH AORTOGRAM  03/30/2011   40% recurrent stenosis at origin of R proximal carotid patch, shelf like recurrent stenosis w/in midsection of patch that does not appear to be flow limiting; normal non-stenosed great vessel origins  . BACK SURGERY  '84,'86, '87   three previous surgeries  . CAROTID ENDARTERECTOMY  2001   Bilateral  . CORONARY ARTERY BYPASS GRAFT  12/04/2012   Procedure: CORONARY ARTERY BYPASS GRAFTING (CABG);  Surgeon: Ivin Poot, MD; LIMA-LAD, SVG-OM, SVG-RCA  . INTRAOPERATIVE TRANSESOPHAGEAL ECHOCARDIOGRAM  12/04/2012   Procedure: INTRAOPERATIVE TRANSESOPHAGEAL ECHOCARDIOGRAM;  Surgeon: Ivin Poot, MD;  Location: [facility];  Service: Open Heart Surgery;  Laterality: N/A;  . KIDNEY STONE SURGERY     Removal  . LEFT HEART CATHETERIZATION WITH CORONARY ANGIOGRAM N/A 11/28/2012   Procedure: LEFT HEART CATHETERIZATION WITH CORONARY ANGIOGRAM;  Surgeon: Leonie Man, MD;  Location[facility] LAB;  Service: Cardiovascular;  Laterality: N/A;  . PERCUTANEOUS CORONARY STENT INTERVENTION (PCI-S)  2002   2 BMS in ostial/Prox & mid RCA (mid 2.5 mm 20x  mm, & 2.75 mm x  8 mm ostial)  . RENAL ANGIOGRAM N/A 11/30/2013   Procedure: RENAL ANGIOGRAM;  Surgeon: Lorretta Harp, MD;  Location: [facility] CATH LAB;  Service: Cardiovascular;  Laterality: N/A;  . RIGHT HEART CATHETERIZATION N/A 12/02/2012   Procedure:  RIGHT HEART CATH;  Surgeon: Troy Sine, MD;  Location: [facility] CATH LAB;  Service: Cardiovascular;  Laterality: N/A;  . TEE WITH CARDIOVERSION  01/23/2011   EF 18-29%; grade 2 diastolic dysfunction, elevated mean LA filling pressure, RV systolic pressure increased consistent w/ mod pulmonary hypertension    Allergies  Allergen Reactions  . Iron Hives  . Vicodin [Hydrocodone-Acetaminophen] Other (See Comments)    Caused arrythmia   . Amiodarone Nausea And Vomiting  . Norvasc [Amlodipine Besylate] Nausea And Vomiting  . Promethazine Other (See Comments)    hallucinations  . Vioxx [Rofecoxib] Nausea And Vomiting    Current Outpatient Prescriptions  Medication Sig Dispense Refill  . alendronate (FOSAMAX) 70 MG tablet once a week. Take one tablet daily for bones    . aspirin 81 MG tablet Take  81 mg by mouth daily.     . carvedilol (COREG) 25 MG tablet Take 0.5 tablets (12.5 mg total) by mouth 2 (two) times daily with a meal. 180 tablet 3  . carvedilol (COREG) 25 MG tablet Take 0.5 tablets (12.5 mg total) by mouth 2 (two) times daily with a meal. 90 tablet 1  . cloNIDine (CATAPRES) 0.1 MG tablet Take 0.1 mg by mouth as needed (SYSTOLIC BP > 811).    [name] LUMIGAN 0.01 % SOLN Place 1 drop into both eyes at bedtime.     . metFORMIN (GLUCOPHAGE) 1000 MG tablet TAKE 1 TABLET TWICE A DAY WITH MEALS 180 tablet 1  . nitroGLYCERIN (NITROSTAT) 0.4 MG SL tablet Place 0.4 mg under the tongue every 5 (five) minutes as needed for chest pain.    . rosuvastatin (CRESTOR) 40 MG tablet Take one tablet by mouth once daily for cholesterol 90 tablet 3  . Vitamin D, Ergocalciferol, (DRISDOL) 50000 units CAPS capsule Take 1 capsule by mouth once a week.     No current facility-administered medications for this visit.     Family History  Problem Relation Age of Onset  . Other Mother        CVA  . Diabetes Mother   . Heart disease Mother        Heart disease before age 27  . Heart attack Mother   . Heart  disease Father        Heart Disease before age 80  . Heart attack Father   . Heart disease Sister        Amputation  . Diabetes Sister   . Heart disease Brother        Heart disease before age 50  . Diabetes Brother   . Stroke Brother   . Stroke Brother   . Heart disease Brother   . Stroke Brother   . Heart disease Brother   . Heart disease Sister   . Heart disease Sister   . Diabetes Daughter   . Cancer Sister   . Diabetes Other   . Cancer Other        breast  . Heart disease Other        cad  . Other Other        cva    Social History   Social History  . Marital status: Widowed    Spouse name: N/A  . Number of children: N/A  . Years of education: N/A   Occupational History  . Not on file.   Social History Main Topics  . Smoking status: Former Smoker    Packs/day: 0.30    Years: 57.00    Types: Cigarettes    Quit date: 11/19/2012  . Smokeless tobacco: Never Used  . Alcohol use 0.6 oz/week    1 Glasses of wine per week  . Drug use: No  . Sexual activity: No   Other Topics Concern  . Not on file   Social History Narrative  . No narrative on file     REVIEW OF SYSTEMS:   [X]  denotes positive finding, [ ]  denotes negative finding Cardiac  Comments:  Chest pain or chest pressure:    Shortness of breath upon exertion: x   Short of breath when lying flat:    Irregular heart rhythm: x       Vascular    Pain in calf, thigh, or hip brought on by ambulation: x   Pain in feet at night that wakes  you up from your sleep:     Blood clot in your veins:    Leg swelling:         Pulmonary    Oxygen at home:    Productive cough:     Wheezing:         Neurologic    Sudden weakness in arms or legs:     Sudden numbness in arms or legs:     Sudden onset of difficulty speaking or slurred speech:    Temporary loss of vision in one eye:     Problems with dizziness:         Gastrointestinal    Blood in stool:     Vomited blood:         Genitourinary      Burning when urinating:     Blood in urine:        Psychiatric    Major depression:         Hematologic    Bleeding problems:    Problems with blood clotting too easily:        Skin    Rashes or ulcers:        Constitutional    Fever or chills:      PHYSICAL EXAMINATION:  Vitals:   05/30/17 1429  BP: (!) 157/82  Pulse: (!) 56  Resp: 20  Temp: 98.2 F (36.8 C)   Body mass index is 28.99 kg/m.  General:  WDWN in NAD; vital signs documented above Gait: Not observed HENT: WNL, normocephalic Pulmonary: normal non-labored breathing , without Rales, rhonchi,  wheezing Cardiac: regular HR, without  Murmurs, rubs or gallops; without carotid bruits Abdomen: soft, NT, no pulsatile masses palpated. Skin: without rashes Vascular Exam/Pulses:  Right Left  Radial 2+ (normal) Unable to palpate   Ulnar Unable to palpate  Unable to palpate   Femoral 2+ (normal) 2+ (normal)  Popliteal Unable to palpate  Unable to palpate   DP 2+ (normal) 2+ (normal)  PT Unable to palpate  Unable to palpate    Extremities: without ischemic changes, without Gangrene , without cellulitis; without open wounds;  Musculoskeletal: no muscle wasting or atrophy  Neurologic: A&O X 3;  No focal weakness or paresthesias are detected Psychiatric:  The pt has Normal affect.   Non-Invasive Vascular Imaging:   Carotid Duplex on 05/30/17: 1.  40-59% right ICA re-stenosis 2.  Right ECA stenosis 3.  1-39% left ICA stenosis --No significant change when compared to exam on 01/26/15  Pt meds includes: Statin:  Yes.   Beta Blocker:  Yes.   Aspirin:  Yes.   ACEI:  No. ARB:  No. CCB use:  No Other Antiplatelet/Anticoagulant:  No   ASSESSMENT/PLAN:: 76 y.o. female with hx of bilateral carotid endarterectomy and redo left carotid endarterectomy, TAA, and PAD.     -pt continues to be asymptomatic and carotid duplex is essentially unchanged.  Repeat carotid duplex in one year -TAA-denies and chest or  stabbing back pain-CTA chest/abdomen/pelvis in one year -hypertension-closely followed and managed by Dr. [name] -claudication -will get ABI's in one year when she returns.  Continue walking program.    Leontine Locket, PA-C [facility] [phone]  Clinic MD:  Pt seen and examined with Dr. Oneida Alar  History and exam findings as above. Patient currently has mild asymptomatic carotid occlusive disease. She has some element of lower extremity peripheral arterial disease but is not really that symptomatic. She has a known thoracoabdominal aneurysm and will have  this rescanned next year.  Ruta Hinds, MD [facility] [place] Office: [phone] Pager: [phone]

## 2017-06-07 NOTE — Addendum Note (Signed)
Addended by: Lianne Cure A on: 06/07/2017 09:24 AM   Modules accepted: Orders

## 2017-06-20 DIAGNOSIS — H5202 Hypermetropia, left eye: Secondary | ICD-10-CM | POA: Diagnosis not present

## 2017-06-20 DIAGNOSIS — Z961 Presence of intraocular lens: Secondary | ICD-10-CM | POA: Diagnosis not present

## 2017-06-20 DIAGNOSIS — H52223 Regular astigmatism, bilateral: Secondary | ICD-10-CM | POA: Diagnosis not present

## 2017-06-20 DIAGNOSIS — H524 Presbyopia: Secondary | ICD-10-CM | POA: Diagnosis not present

## 2017-06-20 DIAGNOSIS — H5211 Myopia, right eye: Secondary | ICD-10-CM | POA: Diagnosis not present

## 2017-06-27 DIAGNOSIS — N183 Chronic kidney disease, stage 3 (moderate): Secondary | ICD-10-CM | POA: Diagnosis not present

## 2017-06-27 DIAGNOSIS — Z9889 Other specified postprocedural states: Secondary | ICD-10-CM | POA: Diagnosis not present

## 2017-06-27 DIAGNOSIS — I129 Hypertensive chronic kidney disease with stage 1 through stage 4 chronic kidney disease, or unspecified chronic kidney disease: Secondary | ICD-10-CM | POA: Diagnosis not present

## 2017-06-27 DIAGNOSIS — E1129 Type 2 diabetes mellitus with other diabetic kidney complication: Secondary | ICD-10-CM | POA: Diagnosis not present

## 2017-06-27 DIAGNOSIS — Z6829 Body mass index (BMI) 29.0-29.9, adult: Secondary | ICD-10-CM | POA: Diagnosis not present

## 2017-07-18 ENCOUNTER — Encounter: Payer: Self-pay | Admitting: Internal Medicine

## 2017-07-18 ENCOUNTER — Ambulatory Visit (INDEPENDENT_AMBULATORY_CARE_PROVIDER_SITE_OTHER): Payer: Medicare Other | Admitting: Internal Medicine

## 2017-07-18 VITALS — BP 140/80 | HR 60 | Temp 98.3°F | Wt 159.0 lb

## 2017-07-18 DIAGNOSIS — E1122 Type 2 diabetes mellitus with diabetic chronic kidney disease: Secondary | ICD-10-CM | POA: Diagnosis not present

## 2017-07-18 DIAGNOSIS — I701 Atherosclerosis of renal artery: Secondary | ICD-10-CM

## 2017-07-18 DIAGNOSIS — R300 Dysuria: Secondary | ICD-10-CM

## 2017-07-18 DIAGNOSIS — N3001 Acute cystitis with hematuria: Secondary | ICD-10-CM | POA: Diagnosis not present

## 2017-07-18 DIAGNOSIS — N183 Chronic kidney disease, stage 3 unspecified: Secondary | ICD-10-CM

## 2017-07-18 LAB — POCT URINALYSIS DIPSTICK
Bilirubin, UA: NEGATIVE
Glucose, UA: NEGATIVE
Ketones, UA: NEGATIVE
Nitrite, UA: NEGATIVE
Spec Grav, UA: <=1.005 — AB (ref 1.010–1.025)
Urobilinogen, UA: NEGATIVE E.U./dL — AB
pH, UA: 7.5 (ref 5.0–8.0)

## 2017-07-18 MED ORDER — CIPROFLOXACIN HCL 500 MG PO TABS: 500.0000 mg | ORAL_TABLET | Freq: Two times a day (BID) | ORAL | 0 refills | Status: DC

## 2017-07-18 MED ORDER — CIPROFLOXACIN HCL 500 MG PO TABS
500.0000 mg | ORAL_TABLET | Freq: Two times a day (BID) | ORAL | 0 refills | Status: DC
Start: 2017-07-18 — End: 2017-08-15

## 2017-07-18 NOTE — Patient Instructions (Signed)
Urinary Tract Infection, Adult A urinary tract infection (UTI) is an infection of any part of the urinary tract, which includes the kidneys, ureters, bladder, and urethra. These organs make, store, and get rid of urine in the body. UTI can be a bladder infection (cystitis) or kidney infection (pyelonephritis). What are the causes? This infection may be caused by fungi, viruses, or bacteria. Bacteria are the most common cause of UTIs. This condition can also be caused by repeated incomplete emptying of the bladder during urination. What increases the risk? This condition is more likely to develop if:  You ignore your need to urinate or hold urine for long periods of time.  You do not empty your bladder completely during urination.  You wipe back to front after urinating or having a bowel movement, if you are female.  You are uncircumcised, if you are female.  You are constipated.  You have a urinary catheter that stays in place (indwelling).  You have a weak defense (immune) system.  You have a medical condition that affects your bowels, kidneys, or bladder.  You have diabetes.  You take antibiotic medicines frequently or for long periods of time, and the antibiotics no longer work well against certain types of infections (antibiotic resistance).  You take medicines that irritate your urinary tract.  You are exposed to chemicals that irritate your urinary tract.  You are female.  What are the signs or symptoms? Symptoms of this condition include:  Fever.  Frequent urination or passing small amounts of urine frequently.  Needing to urinate urgently.  Pain or burning with urination.  Urine that smells bad or unusual.  Cloudy urine.  Pain in the lower abdomen or back.  Trouble urinating.  Blood in the urine.  Vomiting or being less hungry than normal.  Diarrhea or abdominal pain.  Vaginal discharge, if you are female.  How is this diagnosed? This condition is  diagnosed with a medical history and physical exam. You will also need to provide a urine sample to test your urine. Other tests may be done, including:  Blood tests.  Sexually transmitted disease (STD) testing.  If you have had more than one UTI, a cystoscopy or imaging studies may be done to determine the cause of the infections. How is this treated? Treatment for this condition often includes a combination of two or more of the following:  Antibiotic medicine.  Other medicines to treat less common causes of UTI.  Over-the-counter medicines to treat pain.  Drinking enough water to stay hydrated.  Follow these instructions at home:  Take over-the-counter and prescription medicines only as told by your health care provider.  If you were prescribed an antibiotic, take it as told by your health care provider. Do not stop taking the antibiotic even if you start to feel better.  Avoid alcohol, caffeine, tea, and carbonated beverages. They can irritate your bladder.  Drink enough fluid to keep your urine clear or pale yellow.  Keep all follow-up visits as told by your health care provider. This is important.  Make sure to: ? Empty your bladder often and completely. Do not hold urine for long periods of time. ? Empty your bladder before and after sex. ? Wipe from front to back after a bowel movement if you are female. Use each tissue one time when you wipe. Contact a health care provider if:  You have back pain.  You have a fever.  You feel nauseous or vomit.  Your symptoms do not  get better after 3 days.  Your symptoms go away and then return. Get help right away if:  You have severe back pain or lower abdominal pain.  You are vomiting and cannot keep down any medicines or water. This information is not intended to replace advice given to you by your health care provider. Make sure you discuss any questions you have with your health care provider. Document Released:  08/01/2005 Document Revised: 04/04/2016 Document Reviewed: 09/12/2015 Elsevier Interactive Patient Education  2017 [place] yogurt daily while on the antibiotics.  Drink plenty of water and cranberry juice without added sugar like [place].

## 2017-07-18 NOTE — Progress Notes (Signed)
Location:  [facility] clinic Provider: Eilan Mcinerny L. Mariea Clonts, D.O., C.M.D.  Code Status: DNR Goals of Care:  Advanced Directives 05/30/2017  Does Patient Have a Medical Advance Directive? Yes  Type of Paramedic of [place];Living will  Copy of [place] in Chart? -  Would patient like information on creating a medical advance directive? -  Pre-existing out of facility DNR order (yellow form or pink MOST form) -     Chief Complaint  Patient presents with  . Acute Visit    UTI? painful and frequent urination x3 days    HPI: Patient is a 76 y.o. female seen today for an acute visit for painful and frequent urination for 3 days.  She is diabetic.  Not having a full stream when she goes and painful when it ends.  Not sure if it's from her diseased kidney or urinary tract.  Has seen Dr. Nyoka Cowden over 25 yrs.  Is drinking a lot of water and juice.  No suprapubic pain.  Has arthritis in her spine so may have pain more from that.  Has had three back operations.  Sees Dr. Hollie Salk about her kidneys for CKD ([facility]).  Also has renal a stenosis.    Past Medical History:  Diagnosis Date  . Abdominal pain, epigastric   . Acute upper respiratory infections of unspecified site   . Arthritis   . Atrial fibrillation ([place]) 02/21/2011  . Benign paroxysmal positional vertigo   . CAD (coronary artery disease)    prior coronary stenting  . Candidiasis of vulva and vagina   . Carotid artery disease ([place]) 2001   s/p Bilateral CEA, after CVA  . Cervicalgia   . Decreased pedal pulses 06/04/2011   doppler - bilateral ABIs no evidence of insufficiency; L CIA slightly elevated velocities 20-30% diameter reduction;   . Dyspnea 03/13/2006   Myoview - EF 76%; mild ischemia in apical lateral region, less severe than previous study in 2002  . Edema   . H/O carotid endarterectomy 03/06/2012   CT angio -high grade stenosis of proximal R internal carotid w/ lg posterior ulceration  or dissection flap; near occlusive stenosis at origin of nondominantproximal L vertebral artery; <50% stenosis of proximal R vertebral and proximal L subclavian artery  . H/O endarterectomy 09/11/2012   patent L and R carotid sites, R 60-79% restenosed ICA; L <40% restenosed ICA  . H/O endarterectomy 01/08/2012   doppler - R distal common carotid/proximal ICA stenosed 80-99%, L 0-39%  . HTN (hypertension) 11/27/2012  . Hyperlipidemia   . Hypertension   . Limb pain 05/02/2011   doppler - no evidence of thrombus of thrombophlebitis  . Long term (current) use of anticoagulants   . Loss of weight   . Lumbago   . Myalgia and myositis, unspecified   . Myocardial infarction ([place])   . Nonspecific abnormal results of liver function study   . Nontoxic uninodular goiter   . Osteoarthrosis, unspecified whether generalized or localized, unspecified site   . Other malaise and fatigue   . Other manifestations of vitamin A deficiency   . Pain in joint, pelvic region and thigh   . Pain in limb   . Peripheral vascular disease ([place])   . Phlebitis and thrombophlebitis of superficial vessels of lower extremities   . Primary localized osteoarthrosis, hand   . Pulmonary HTN ([place]) 12/03/2012  . Renal artery stenosis ([place])   . Routine gynecological examination   . Spinal stenosis, unspecified region  other than cervical   . Stenosis of artery ([facility]) 06/03/2012   doppler- most distal aspect of abd aorta no aneurysmal dilation; bilateral ABIs - mild L arterial insufficiency, L CIA narrowing w/ 50-69% diameter reduction; L and R SFA both w/ 0-49% diameter reductions  . Stroke ([place]) 2001  . Superficial vein thrombosis 05/11/2015   Left forehead  . Tobacco use disorder   . TR (tricuspid regurgitation), severe 12/02/12 for repair with CABG 12/03/2012  . Type II or unspecified type diabetes mellitus with peripheral circulatory disorders, not stated as uncontrolled(250.70)   . Unspecified cataract   . Unspecified disorder  of iris and ciliary body   . Unspecified vitamin D deficiency   . Urinary tract infection, site not specified     Past Surgical History:  Procedure Laterality Date  . ABDOMINAL HYSTERECTOMY  1975  . ANGIOPLASTY  1984 and 1982  . APPENDECTOMY  1954  . ARCH AORTOGRAM  03/30/2011   40% recurrent stenosis at origin of R proximal carotid patch, shelf like recurrent stenosis w/in midsection of patch that does not appear to be flow limiting; normal non-stenosed great vessel origins  . BACK SURGERY  '84,'86, '87   three previous surgeries  . CAROTID ENDARTERECTOMY  2001   Bilateral  . CORONARY ARTERY BYPASS GRAFT  12/04/2012   Procedure: CORONARY ARTERY BYPASS GRAFTING (CABG);  Surgeon: Ivin Poot, MD; LIMA-LAD, SVG-OM, SVG-RCA  . INTRAOPERATIVE TRANSESOPHAGEAL ECHOCARDIOGRAM  12/04/2012   Procedure: INTRAOPERATIVE TRANSESOPHAGEAL ECHOCARDIOGRAM;  Surgeon: Ivin Poot, MD;  Location: [place];  Service: Open Heart Surgery;  Laterality: N/A;  . KIDNEY STONE SURGERY     Removal  . LEFT HEART CATHETERIZATION WITH CORONARY ANGIOGRAM N/A 11/28/2012   Procedure: LEFT HEART CATHETERIZATION WITH CORONARY ANGIOGRAM;  Surgeon: Leonie Man, MD;  [facility] LAB;  Service: Cardiovascular;  Laterality: N/A;  . PERCUTANEOUS CORONARY STENT INTERVENTION (PCI-S)  2002   2 BMS in ostial/Prox & mid RCA (mid 2.5 mm 20x  mm, & 2.75 mm x  8 mm ostial)  . RENAL ANGIOGRAM N/A 11/30/2013   Procedure: RENAL ANGIOGRAM;  Surgeon: Lorretta Harp, MD;  Location: [facility] CATH LAB;  Service: Cardiovascular;  Laterality: N/A;  . RIGHT HEART CATHETERIZATION N/A 12/02/2012   Procedure: RIGHT HEART CATH;  Surgeon: Troy Sine, MD;  Location: [facility] CATH LAB;  Service: Cardiovascular;  Laterality: N/A;  . TEE WITH CARDIOVERSION  01/23/2011   EF 60-73%; grade 2 diastolic dysfunction, elevated mean LA filling pressure, RV systolic pressure increased consistent w/ mod pulmonary hypertension    Allergies  Allergen Reactions    . Iron Hives  . Vicodin [Hydrocodone-Acetaminophen] Other (See Comments)    Caused arrythmia   . Amiodarone Nausea And Vomiting  . Norvasc [Amlodipine Besylate] Nausea And Vomiting  . Promethazine Other (See Comments)    hallucinations  . Vioxx [Rofecoxib] Nausea And Vomiting    Allergies as of 07/18/2017      Reactions   Iron Hives   Vicodin [hydrocodone-acetaminophen] Other (See Comments)   Caused arrythmia    Amiodarone Nausea And Vomiting   Norvasc [amlodipine Besylate] Nausea And Vomiting   Promethazine Other (See Comments)   hallucinations   Vioxx [rofecoxib] Nausea And Vomiting      Medication List       Accurate as of 07/18/17 11:32 AM. Always use your most recent med list.          alendronate 70 MG tablet Commonly known as:  FOSAMAX Take 70  mg by mouth once a week.   aspirin 81 MG tablet Take 81 mg by mouth daily.   carvedilol 25 MG tablet Commonly known as:  COREG Take 0.5 tablets (12.5 mg total) by mouth 2 (two) times daily with a meal.   cloNIDine 0.1 MG tablet Commonly known as:  CATAPRES Take 0.1 mg by mouth as needed (SYSTOLIC BP > 741).   LUMIGAN 0.01 % Soln Generic drug:  bimatoprost Place 1 drop into both eyes at bedtime.   metFORMIN 1000 MG tablet Commonly known as:  GLUCOPHAGE TAKE 1 TABLET TWICE A DAY WITH MEALS   nitroGLYCERIN 0.4 MG SL tablet Commonly known as:  NITROSTAT Place 0.4 mg under the tongue every 5 (five) minutes as needed for chest pain.   rosuvastatin 40 MG tablet Commonly known as:  CRESTOR Take one tablet by mouth once daily for cholesterol   Vitamin D (Ergocalciferol) 50000 units Caps capsule Commonly known as:  DRISDOL Take 1 capsule by mouth once a week.            Discharge Care Instructions        Start     Ordered   07/18/17 0000  POC Urinalysis Dipstick     07/18/17 1117   07/18/17 0000  Urine Culture     07/18/17 1117      Review of Systems:  Review of Systems  Constitutional: Negative  for chills, fever and malaise/fatigue.  HENT: Negative for congestion.   Eyes: Negative for blurred vision.  Respiratory: Negative for shortness of breath.   Cardiovascular: Negative for chest pain and palpitations.  Gastrointestinal: Negative for abdominal pain, blood in stool, constipation, diarrhea and melena.  Genitourinary: Positive for dysuria, frequency and urgency. Negative for flank pain and hematuria.  Musculoskeletal: Positive for back pain. Negative for falls.  Skin: Negative for itching and rash.  Neurological: Negative for dizziness and weakness.  Psychiatric/Behavioral: Negative for depression and memory loss. The patient is not nervous/anxious and does not have insomnia.     Health Maintenance  Topic Date Due  . FOOT EXAM  01/12/2016  . OPHTHALMOLOGY EXAM  09/18/2017  . HEMOGLOBIN A1C  10/15/2017  . URINE MICROALBUMIN  04/15/2018  . TETANUS/TDAP  11/13/2021  . DEXA SCAN  Completed  . PNA vac Low Risk Adult  Completed  . INFLUENZA VACCINE  Excluded    Physical Exam: Vitals:   07/18/17 1103  BP: 140/80  Pulse: 60  Temp: 98.3 F (36.8 C)  TempSrc: Oral  SpO2: 98%  Weight: 159 lb (72.1 kg)   Body mass index is 29.08 kg/m. Physical Exam  Constitutional: She is oriented to person, place, and time. She appears well-developed and well-nourished. No distress.  Cardiovascular: Normal rate, regular rhythm, normal heart sounds and intact distal pulses.   Pulmonary/Chest: Effort normal and breath sounds normal. No respiratory distress.  Abdominal: Soft. Bowel sounds are normal. She exhibits no distension and no mass. There is tenderness. There is no rebound and no guarding.  Full feeling when bladder palpated though just emptied her bladder  Genitourinary:  Genitourinary Comments: Suprapubic tenderness  Musculoskeletal: Normal range of motion.  Neurological: She is alert and oriented to person, place, and time.  Skin: Skin is warm and dry. Capillary refill takes  less than 2 seconds.  Psychiatric: She has a normal mood and affect.  Very pleasant    Labs reviewed: Basic Metabolic Panel:  Recent Labs  09/11/16 1017 09/25/16 1059 12/07/16 0001 04/15/17 0906  NA 136 136  136 138  K 4.8 5.0 4.5 4.7  CL 98 100 103 102  CO2 27 27 25 27   GLUCOSE 116* 92 207* 91  BUN 34* 60* 25 23  CREATININE 1.98* 2.64* 1.32* 1.38*  CALCIUM 9.4 9.5 9.1 9.1  MG 2.1  --   --   --    Liver Function Tests:  Recent Labs  12/07/16 0001 04/15/17 0906  AST 15 14  ALT 8 6  ALKPHOS 38 38  BILITOT 0.9 0.6  PROT 6.5 6.7  ALBUMIN 3.9 3.6   No results for input(s): LIPASE, AMYLASE in the last 8760 hours. No results for input(s): AMMONIA in the last 8760 hours. CBC: No results for input(s): WBC, NEUTROABS, HGB, HCT, MCV, PLT in the last 8760 hours. Lipid Panel:  Recent Labs  08/06/16 0950 12/07/16 0001 04/15/17 0906  CHOL 187 145 164  HDL 48 50* 55  LDLCALC 121 82 95  TRIG 92 63 68  CHOLHDL 3.9 2.9 3.0   Lab Results  Component Value Date   HGBA1C 6.4 (H) 04/15/2017    Assessment/Plan 1. Dysuria -seems to have classic UTI symptoms with positive dip so sent for culture -push po fluids with water and [place] cranberry or similar -cipro and yogurt for 7 days - POC Urinalysis Dipstick - Urine Culture - ciprofloxacin (CIPRO) 500 MG tablet; Take 1 tablet (500 mg total) by mouth 2 (two) times daily.  Dispense: 14 tablet; Refill: 0  2. Acute cystitis with hematuria -see #1 - ciprofloxacin (CIPRO) 500 MG tablet; Take 1 tablet (500 mg total) by mouth 2 (two) times daily.  Dispense: 14 tablet; Refill: 0  3. Type 2 diabetes mellitus with stage 3 chronic kidney disease, without long-term current use of insulin (HCC) -well controlled, cont current regimen, nothing suggests a kidney problem today with classic UTI presentation Lab Results  Component Value Date   HGBA1C 6.4 (H) 04/15/2017   Labs/tests ordered:   Orders Placed This Encounter  Procedures   . Urine Culture  . POC Urinalysis Dipstick    Next appt:  08/15/2017--keep regular appt  Rizwan Kuyper L. Perri Lamagna, D.O. [facility] [address], [place] [address] Cell Phone (Mon-Fri 8am-5pm):  [phone] On Call:  [phone] & follow prompts after 5pm & weekends Office Phone:  [phone] Office Fax:  [phone]

## 2017-07-19 ENCOUNTER — Other Ambulatory Visit: Payer: Self-pay | Admitting: Internal Medicine

## 2017-07-21 LAB — URINE CULTURE
MICRO NUMBER:: 81012188
SPECIMEN QUALITY:: ADEQUATE

## 2017-07-23 DIAGNOSIS — M79642 Pain in left hand: Secondary | ICD-10-CM | POA: Diagnosis not present

## 2017-07-23 DIAGNOSIS — M160 Bilateral primary osteoarthritis of hip: Secondary | ICD-10-CM | POA: Diagnosis not present

## 2017-07-23 DIAGNOSIS — M19042 Primary osteoarthritis, left hand: Secondary | ICD-10-CM | POA: Diagnosis not present

## 2017-07-23 DIAGNOSIS — M810 Age-related osteoporosis without current pathological fracture: Secondary | ICD-10-CM | POA: Diagnosis not present

## 2017-07-23 DIAGNOSIS — M859 Disorder of bone density and structure, unspecified: Secondary | ICD-10-CM | POA: Diagnosis not present

## 2017-07-23 DIAGNOSIS — M25561 Pain in right knee: Secondary | ICD-10-CM | POA: Diagnosis not present

## 2017-07-23 DIAGNOSIS — M79641 Pain in right hand: Secondary | ICD-10-CM | POA: Diagnosis not present

## 2017-07-23 DIAGNOSIS — M159 Polyosteoarthritis, unspecified: Secondary | ICD-10-CM | POA: Diagnosis not present

## 2017-07-23 DIAGNOSIS — M545 Low back pain: Secondary | ICD-10-CM | POA: Diagnosis not present

## 2017-07-23 DIAGNOSIS — M19041 Primary osteoarthritis, right hand: Secondary | ICD-10-CM | POA: Diagnosis not present

## 2017-08-15 ENCOUNTER — Encounter: Payer: Self-pay | Admitting: Internal Medicine

## 2017-08-15 ENCOUNTER — Ambulatory Visit (INDEPENDENT_AMBULATORY_CARE_PROVIDER_SITE_OTHER): Payer: Medicare Other | Admitting: Internal Medicine

## 2017-08-15 VITALS — BP 120/70 | HR 68 | Temp 98.1°F | Wt 163.0 lb

## 2017-08-15 DIAGNOSIS — M810 Age-related osteoporosis without current pathological fracture: Secondary | ICD-10-CM | POA: Diagnosis not present

## 2017-08-15 DIAGNOSIS — I2510 Atherosclerotic heart disease of native coronary artery without angina pectoris: Secondary | ICD-10-CM

## 2017-08-15 DIAGNOSIS — I480 Paroxysmal atrial fibrillation: Secondary | ICD-10-CM | POA: Diagnosis not present

## 2017-08-15 DIAGNOSIS — N183 Chronic kidney disease, stage 3 unspecified: Secondary | ICD-10-CM

## 2017-08-15 DIAGNOSIS — M159 Polyosteoarthritis, unspecified: Secondary | ICD-10-CM

## 2017-08-15 DIAGNOSIS — E7800 Pure hypercholesterolemia, unspecified: Secondary | ICD-10-CM

## 2017-08-15 DIAGNOSIS — M150 Primary generalized (osteo)arthritis: Secondary | ICD-10-CM | POA: Diagnosis not present

## 2017-08-15 DIAGNOSIS — I701 Atherosclerosis of renal artery: Secondary | ICD-10-CM | POA: Diagnosis not present

## 2017-08-15 DIAGNOSIS — E1122 Type 2 diabetes mellitus with diabetic chronic kidney disease: Secondary | ICD-10-CM | POA: Diagnosis not present

## 2017-08-15 DIAGNOSIS — I6523 Occlusion and stenosis of bilateral carotid arteries: Secondary | ICD-10-CM | POA: Diagnosis not present

## 2017-08-15 DIAGNOSIS — M8949 Other hypertrophic osteoarthropathy, multiple sites: Secondary | ICD-10-CM

## 2017-08-15 NOTE — Progress Notes (Signed)
She had severe GI bleeding years ago ahen she was on Xarelto. Did you cature the arrhythmia on ECG? If not, will try to firmly document the atrial fibrillation first. I'll schedule her for a 14 day monitor. I we see atrial fibrillation, will prefer Eliquis, for possibly safer GI bleed profile. Thanks!

## 2017-08-15 NOTE — Progress Notes (Signed)
Location:  [facility] clinic Provider:  Rayona Sardinha L. Mariea Clonts, D.O., C.M.D.  Code Status: full code Goals of Care:  Advanced Directives 08/15/2017  Does Patient Have a Medical Advance Directive? No  Type of Advance Directive -  Copy of [place] in Chart? -  Would patient like information on creating a medical advance directive? No - Patient declined  Pre-existing out of facility DNR order (yellow form or pink MOST form) -   Chief Complaint  Patient presents with  . Medical Management of Chronic Issues    20mth follow-up    HPI: Patient is a 76 y.o. female seen today for medical management of chronic diseases.   She saw me initially for UTI 9/13.  All resolved.  Abx worked well.  CAD:  Had triple CABG in 2014.  No pains "that need to be reported".  Does occasionally get a short pain in her left side at the mitral area.  Follows with cardiology.  Has prn NTG and knows what to do. Takes her baby asa.   CKD:  Dr. Hollie Salk took her off all of her ARBs and changed her medication around.  Had been on diovan before and much better since that change.    DMII:  Sugar avg has bene good for a long time.  On metformin.    Hyperlipidemia: LDL 95 in June.  Is on crestor 40mg .  No problems from the medication.  95 is the best it's been in a long time.  Avoids fried foods and fast foods.  Does not want more medication.    Bilateral carotid a stenosis:  Dr. Oneida Alar is following.    Sacroiliitis:  Flares up at times.  Dr. Dossie Der noticed she had some scoliosis also.  Explained some of her pain.    Osteoporosis:  On fosamax and Vitamin D therapy.  Has been on fosamax for at least two years.  Rheum has been managing.    Has OA.  Sees Dr. Dossie Der from rheum--had hip and hand xrays with her last month.  Takes extra strength tylenol only.   Does have paroxysmal afib.  Comes and goes.  Is on baby asa.  Notes from Dr. Loletha Grayer indicate the afib was remote and not recent.  Had a cardioversion in 2012.  Did not  tolerate amiodarone then.  Reports that after her first carotid surgery, she had a stroke, but had not residual from that and had no long term effect after therapy.    Dr. Oneida Alar does an [place] and CT scans to f/u on the carotids.    Does not take flu shots, but did get pneumonia shots.  She doesn't believe it will keep her from getting the flu.  She rarely gets sick with colds.  Had shingles twice.  Discussed shingrix with her today and she's going to think about it.    Past Medical History:  Diagnosis Date  . Abdominal pain, epigastric   . Acute upper respiratory infections of unspecified site   . Arthritis   . Atrial fibrillation ([place]) 02/21/2011  . Benign paroxysmal positional vertigo   . CAD (coronary artery disease)    prior coronary stenting  . Candidiasis of vulva and vagina   . Carotid artery disease ([place]) 2001   s/p Bilateral CEA, after CVA  . Cervicalgia   . Decreased pedal pulses 06/04/2011   doppler - bilateral ABIs no evidence of insufficiency; L CIA slightly elevated velocities 20-30% diameter reduction;   . Dyspnea 03/13/2006  Myoview - EF 76%; mild ischemia in apical lateral region, less severe than previous study in 2002  . Edema   . H/O carotid endarterectomy 03/06/2012   CT angio -high grade stenosis of proximal R internal carotid w/ lg posterior ulceration or dissection flap; near occlusive stenosis at origin of nondominantproximal L vertebral artery; <50% stenosis of proximal R vertebral and proximal L subclavian artery  . H/O endarterectomy 09/11/2012   patent L and R carotid sites, R 60-79% restenosed ICA; L <40% restenosed ICA  . H/O endarterectomy 01/08/2012   doppler - R distal common carotid/proximal ICA stenosed 80-99%, L 0-39%  . HTN (hypertension) 11/27/2012  . Hyperlipidemia   . Hypertension   . Limb pain 05/02/2011   doppler - no evidence of thrombus of thrombophlebitis  . Long term (current) use of anticoagulants   . Loss of weight   . Lumbago   . Myalgia  and myositis, unspecified   . Myocardial infarction ([place])   . Nonspecific abnormal results of liver function study   . Nontoxic uninodular goiter   . Osteoarthrosis, unspecified whether generalized or localized, unspecified site   . Other malaise and fatigue   . Other manifestations of vitamin A deficiency   . Pain in joint, pelvic region and thigh   . Pain in limb   . Peripheral vascular disease ([place])   . Phlebitis and thrombophlebitis of superficial vessels of lower extremities   . Primary localized osteoarthrosis, hand   . Pulmonary HTN ([place]) 12/03/2012  . Renal artery stenosis ([place])   . Routine gynecological examination   . Spinal stenosis, unspecified region other than cervical   . Stenosis of artery ([place]) 06/03/2012   doppler- most distal aspect of abd aorta no aneurysmal dilation; bilateral ABIs - mild L arterial insufficiency, L CIA narrowing w/ 50-69% diameter reduction; L and R SFA both w/ 0-49% diameter reductions  . Stroke ([place]) 2001  . Superficial vein thrombosis 05/11/2015   Left forehead  . Tobacco use disorder   . TR (tricuspid regurgitation), severe 12/02/12 for repair with CABG 12/03/2012  . Type II or unspecified type diabetes mellitus with peripheral circulatory disorders, not stated as uncontrolled(250.70)   . Unspecified cataract   . Unspecified disorder of iris and ciliary body   . Unspecified vitamin D deficiency   . Urinary tract infection, site not specified     Past Surgical History:  Procedure Laterality Date  . ABDOMINAL HYSTERECTOMY  1975  . ANGIOPLASTY  1984 and 1982  . APPENDECTOMY  1954  . ARCH AORTOGRAM  03/30/2011   40% recurrent stenosis at origin of R proximal carotid patch, shelf like recurrent stenosis w/in midsection of patch that does not appear to be flow limiting; normal non-stenosed great vessel origins  . BACK SURGERY  '84,'86, '87   three previous surgeries  . CAROTID ENDARTERECTOMY  2001   Bilateral  . CORONARY ARTERY BYPASS GRAFT   12/04/2012   Procedure: CORONARY ARTERY BYPASS GRAFTING (CABG);  Surgeon: Ivin Poot, MD; LIMA-LAD, SVG-OM, SVG-RCA  . INTRAOPERATIVE TRANSESOPHAGEAL ECHOCARDIOGRAM  12/04/2012   Procedure: INTRAOPERATIVE TRANSESOPHAGEAL ECHOCARDIOGRAM;  Surgeon: Ivin Poot, MD;  Location: [facility];  Service: Open Heart Surgery;  Laterality: N/A;  . KIDNEY STONE SURGERY     Removal  . LEFT HEART CATHETERIZATION WITH CORONARY ANGIOGRAM N/A 11/28/2012   Procedure: LEFT HEART CATHETERIZATION WITH CORONARY ANGIOGRAM;  Surgeon: Leonie Man, MD;  Location: [facility] CATH LAB;  Service: Cardiovascular;  Laterality: N/A;  . PERCUTANEOUS CORONARY  STENT INTERVENTION (PCI-S)  2002   2 BMS in ostial/Prox & mid RCA (mid 2.5 mm 20x  mm, & 2.75 mm x  8 mm ostial)  . RENAL ANGIOGRAM N/A 11/30/2013   Procedure: RENAL ANGIOGRAM;  Surgeon: Lorretta Harp, MD;  Location: [facility] CATH LAB;  Service: Cardiovascular;  Laterality: N/A;  . RIGHT HEART CATHETERIZATION N/A 12/02/2012   Procedure: RIGHT HEART CATH;  Surgeon: Troy Sine, MD;  Location: [facility] CATH LAB;  Service: Cardiovascular;  Laterality: N/A;  . TEE WITH CARDIOVERSION  01/23/2011   EF 25-95%; grade 2 diastolic dysfunction, elevated mean LA filling pressure, RV systolic pressure increased consistent w/ mod pulmonary hypertension    Allergies  Allergen Reactions  . Iron Hives  . Vicodin [Hydrocodone-Acetaminophen] Other (See Comments)    Caused arrythmia   . Amiodarone Nausea And Vomiting  . Norvasc [Amlodipine Besylate] Nausea And Vomiting  . Promethazine Other (See Comments)    hallucinations  . Vioxx [Rofecoxib] Nausea And Vomiting    Outpatient Encounter Prescriptions as of 08/15/2017  Medication Sig  . alendronate (FOSAMAX) 70 MG tablet Take 70 mg by mouth once a week.   [name] aspirin 81 MG tablet Take 81 mg by mouth daily.   . carvedilol (COREG) 25 MG tablet Take 0.5 tablets (12.5 mg total) by mouth 2 (two) times daily with a meal.  . cloNIDine (CATAPRES) 0.1 MG  tablet Take 0.1 mg by mouth as needed (SYSTOLIC BP > 638).  [name] LUMIGAN 0.01 % SOLN Place 1 drop into both eyes at bedtime.   . metFORMIN (GLUCOPHAGE) 1000 MG tablet TAKE 1 TABLET TWICE A DAY WITH MEALS  . nitroGLYCERIN (NITROSTAT) 0.4 MG SL tablet Place 0.4 mg under the tongue every 5 (five) minutes as needed for chest pain.  . rosuvastatin (CRESTOR) 40 MG tablet Take one tablet by mouth once daily for cholesterol  . Vitamin D, Ergocalciferol, (DRISDOL) 50000 units CAPS capsule Take 1 capsule by mouth once a week.  . [DISCONTINUED] ciprofloxacin (CIPRO) 500 MG tablet Take 1 tablet (500 mg total) by mouth 2 (two) times daily.   No facility-administered encounter medications on file as of 08/15/2017.     Review of Systems:  ROS  Health Maintenance  Topic Date Due  . FOOT EXAM  01/12/2016  . OPHTHALMOLOGY EXAM  09/18/2017  . HEMOGLOBIN A1C  10/15/2017  . URINE MICROALBUMIN  04/15/2018  . TETANUS/TDAP  11/13/2021  . DEXA SCAN  Completed  . PNA vac Low Risk Adult  Completed  . INFLUENZA VACCINE  Excluded    Physical Exam: Vitals:   08/15/17 0952  BP: 120/70  Pulse: 68  Temp: 98.1 F (36.7 C)  TempSrc: Oral  SpO2: 97%  Weight: 163 lb (73.9 kg)   Body mass index is 29.81 kg/m. Physical Exam  Labs reviewed: Basic Metabolic Panel:  Recent Labs  09/11/16 1017 09/25/16 1059 12/07/16 0001 04/15/17 0906  NA 136 136 136 138  K 4.8 5.0 4.5 4.7  CL 98 100 103 102  CO2 27 27 25 27   GLUCOSE 116* 92 207* 91  BUN 34* 60* 25 23  CREATININE 1.98* 2.64* 1.32* 1.38*  CALCIUM 9.4 9.5 9.1 9.1  MG 2.1  --   --   --    Liver Function Tests:  Recent Labs  12/07/16 0001 04/15/17 0906  AST 15 14  ALT 8 6  ALKPHOS 38 38  BILITOT 0.9 0.6  PROT 6.5 6.7  ALBUMIN 3.9 3.6   No results for  input(s): LIPASE, AMYLASE in the last 8760 hours. No results for input(s): AMMONIA in the last 8760 hours. CBC: No results for input(s): WBC, NEUTROABS, HGB, HCT, MCV, PLT in the last 8760  hours. Lipid Panel:  Recent Labs  12/07/16 0001 04/15/17 0906  CHOL 145 164  HDL 50* 55  LDLCALC 82 95  TRIG 63 68  CHOLHDL 2.9 3.0   Lab Results  Component Value Date   HGBA1C 6.4 (H) 04/15/2017    Assessment/Plan 1. Type 2 diabetes mellitus with stage 3 chronic kidney disease, without long-term current use of insulin (HCC) - hba1c has been at goal, follows a healthy diet, cont metformin therapy, f/u labs before next appt - CBC with Differential/Platelet; Future - COMPLETE METABOLIC PANEL WITH GFR; Future - Hemoglobin A1c; Future - Lipid panel; Future  2. Coronary artery disease involving native coronary artery of native heart without angina pectoris - stable, cont asa, statin, coreg therapy; has prn ntg  - Lipid panel; Future  3. CKD (chronic kidney disease) stage 3, GFR 30-59 ml/min (HCC) -Avoid nephrotoxic agents like nsaids, dose adjust renally excreted meds, hydrate. - CBC with Differential/Platelet; Future - COMPLETE METABOLIC PANEL WITH GFR; Future -has renal artery stenosis  4. Pure hypercholesterolemia -cont crestor therapy, LDL is above goal of 70, but pt really does not want to change meds, is already doing well with her diet regimen - Lipid panel; Future  5. Bilateral carotid artery stenosis -s/p b/l CEA in 2001 -follows with Dr. Oneida Alar for screening studies  6. Paroxysmal atrial fibrillation (HCC) - has been having episodes of afib lately and was irreg irreg on exam today -discussed idea of xarelto or eliquis instead of asa 81 with him due to [place] of 5+ -will copy Dr. Loletha Grayer since he follows her and get his opinion since she's not been on this so far -pt is agreeable to seeing him again sooner for eval if needed  - CBC with Differential/Platelet; Future  7. Primary osteoarthritis involving multiple joints -follows with Dr. Dossie Der at rheum  8.  Senile osteoporosis Cont fosamax and vitamin D therapy Monitored by rheum  Labs/tests ordered:  Orders  Placed This Encounter  Procedures  . CBC with Differential/Platelet    Standing Status:   Future    Standing Expiration Date:   04/15/2018  . COMPLETE METABOLIC PANEL WITH GFR    Standing Status:   Future    Standing Expiration Date:   04/15/2018  . Hemoglobin A1c    Standing Status:   Future    Standing Expiration Date:   04/15/2018  . Lipid panel    Standing Status:   Future    Standing Expiration Date:   04/15/2018    Next appt:  12/26/2017 med mgt, AWV with Clarise Cruz, labs before  Alesandro Stueve L. Shashwat Cleary, D.O. [facility] [address], [place] [address] Cell Phone (Mon-Fri 8am-5pm):  [phone] On Call:  [phone] & follow prompts after 5pm & weekends Office Phone:  [phone] Office Fax:  [phone]

## 2017-08-16 NOTE — Progress Notes (Signed)
Please schedule for 14 day monitor

## 2017-09-03 ENCOUNTER — Telehealth: Payer: Self-pay

## 2017-09-03 DIAGNOSIS — I480 Paroxysmal atrial fibrillation: Secondary | ICD-10-CM

## 2017-09-03 NOTE — Telephone Encounter (Signed)
    [place], Kendra Gobble, Kendra Todd at 08/15/2017 10:00 AM   Status: Signed    She had severe GI bleeding years ago ahen she was on Xarelto. Did you cature the arrhythmia on ECG? If not, will try to firmly document the atrial fibrillation first. I'll schedule her for a 14 day monitor. I we see atrial fibrillation, will prefer Eliquis, for possibly safer GI bleed profile. Thanks!

## 2017-09-03 NOTE — Telephone Encounter (Signed)
Called patient. Notified her of recommendations and that someone would be calling to schedule her appointment. Patient verbalized understanding and agreed with plan.

## 2017-09-19 ENCOUNTER — Ambulatory Visit (INDEPENDENT_AMBULATORY_CARE_PROVIDER_SITE_OTHER): Payer: Medicare Other

## 2017-09-19 DIAGNOSIS — I480 Paroxysmal atrial fibrillation: Secondary | ICD-10-CM

## 2017-10-02 DIAGNOSIS — M859 Disorder of bone density and structure, unspecified: Secondary | ICD-10-CM | POA: Diagnosis not present

## 2017-10-02 DIAGNOSIS — M159 Polyosteoarthritis, unspecified: Secondary | ICD-10-CM | POA: Diagnosis not present

## 2017-10-02 DIAGNOSIS — M25561 Pain in right knee: Secondary | ICD-10-CM | POA: Diagnosis not present

## 2017-10-02 DIAGNOSIS — N189 Chronic kidney disease, unspecified: Secondary | ICD-10-CM | POA: Diagnosis not present

## 2017-10-02 DIAGNOSIS — M25519 Pain in unspecified shoulder: Secondary | ICD-10-CM | POA: Diagnosis not present

## 2017-10-02 DIAGNOSIS — M810 Age-related osteoporosis without current pathological fracture: Secondary | ICD-10-CM | POA: Diagnosis not present

## 2017-10-02 DIAGNOSIS — M545 Low back pain: Secondary | ICD-10-CM | POA: Diagnosis not present

## 2017-10-03 ENCOUNTER — Encounter: Payer: Self-pay | Admitting: Internal Medicine

## 2017-10-03 DIAGNOSIS — H401131 Primary open-angle glaucoma, bilateral, mild stage: Secondary | ICD-10-CM | POA: Diagnosis not present

## 2017-10-03 LAB — HM DIABETES EYE EXAM

## 2017-10-09 ENCOUNTER — Telehealth: Payer: Self-pay | Admitting: Cardiovascular Disease

## 2017-10-09 NOTE — Telephone Encounter (Signed)
Patient calling,states that she is returning your call in regards to event monitor results

## 2017-10-16 MED ORDER — APIXABAN 5 MG PO TABS: 5.0000 mg | ORAL_TABLET | Freq: Two times a day (BID) | ORAL | 3 refills | Status: DC

## 2017-10-16 NOTE — Telephone Encounter (Signed)
Notes recorded by Diana Eves, CMA on 10/09/2017 at 4:28 PM EST Returned call to patient. Results reviewed. Patient agreeable to start Eliquis 5 mg BID.  Rx(s) sent to pharmacy electronically.

## 2017-10-19 ENCOUNTER — Other Ambulatory Visit: Payer: Self-pay | Admitting: Cardiovascular Disease

## 2017-10-23 ENCOUNTER — Other Ambulatory Visit: Payer: Self-pay | Admitting: *Deleted

## 2017-10-23 MED ORDER — ROSUVASTATIN CALCIUM 40 MG PO TABS: ORAL_TABLET | ORAL | 3 refills | Status: DC

## 2017-10-23 NOTE — Telephone Encounter (Signed)
Patient requested refill to be sent to [facility].  

## 2017-10-24 ENCOUNTER — Other Ambulatory Visit: Payer: Self-pay

## 2017-10-24 MED ORDER — APIXABAN 5 MG PO TABS: 5.0000 mg | ORAL_TABLET | Freq: Two times a day (BID) | ORAL | 1 refills | Status: DC

## 2017-12-03 ENCOUNTER — Encounter: Payer: Self-pay | Admitting: Internal Medicine

## 2017-12-03 ENCOUNTER — Ambulatory Visit (INDEPENDENT_AMBULATORY_CARE_PROVIDER_SITE_OTHER): Payer: Medicare Other | Admitting: Internal Medicine

## 2017-12-03 VITALS — BP 124/88 | HR 50 | Temp 97.6°F | Ht 62.0 in | Wt 161.0 lb

## 2017-12-03 DIAGNOSIS — M150 Primary generalized (osteo)arthritis: Secondary | ICD-10-CM

## 2017-12-03 DIAGNOSIS — M159 Polyosteoarthritis, unspecified: Secondary | ICD-10-CM

## 2017-12-03 DIAGNOSIS — M542 Cervicalgia: Secondary | ICD-10-CM | POA: Diagnosis not present

## 2017-12-03 DIAGNOSIS — M62838 Other muscle spasm: Secondary | ICD-10-CM

## 2017-12-03 MED ORDER — TIZANIDINE HCL 2 MG PO CAPS: 2.0000 mg | ORAL_CAPSULE | Freq: Three times a day (TID) | ORAL | 1 refills | Status: DC

## 2017-12-03 NOTE — Progress Notes (Signed)
Patient ID: Kendra Todd, female   DOB: 1941-08-25, 77 y.o.   MRN: [id number]   [facility] OFFICE  Provider: DR Arletha Grippe  Code Status:  Goals of Care:  Advanced Directives 08/15/2017  Does Patient Have a Medical Advance Directive? No  Type of Advance Directive -  Copy of [place] in Chart? -  Would patient like information on creating a medical advance directive? No - Patient declined  Pre-existing out of facility DNR order (yellow form or pink MOST form) -     Chief Complaint  Patient presents with  . Acute Visit    Patient c/o swollen gland and pain on L side of neck; no known injury, started last Tues  . Medication Refill    No Refills    HPI: Patient is a 77 y.o. female seen today for an acute visit for left neck pain x 1 week. No known injury. Left neck swollen and has reduced left neck rotation. She is applying heat pad which helps a little.  Pain is sharp but slightly better, 4-5/10 on scale. No numbness/tingling in UE. No weakness. She has chronic intermittent dysphagia.  Past Medical History:  Diagnosis Date  . Abdominal pain, epigastric   . Acute upper respiratory infections of unspecified site   . Arthritis   . Atrial fibrillation ([place]) 02/21/2011  . Benign paroxysmal positional vertigo   . CAD (coronary artery disease)    prior coronary stenting  . Candidiasis of vulva and vagina   . Carotid artery disease ([place]) 2001   s/p Bilateral CEA, after CVA  . Cervicalgia   . Decreased pedal pulses 06/04/2011   doppler - bilateral ABIs no evidence of insufficiency; L CIA slightly elevated velocities 20-30% diameter reduction;   . Dyspnea 03/13/2006   Myoview - EF 76%; mild ischemia in apical lateral region, less severe than previous study in 2002  . Edema   . H/O carotid endarterectomy 03/06/2012   CT angio -high grade stenosis of proximal R internal carotid w/ lg posterior ulceration or dissection flap; near occlusive stenosis at origin of  nondominantproximal L vertebral artery; <50% stenosis of proximal R vertebral and proximal L subclavian artery  . H/O endarterectomy 09/11/2012   patent L and R carotid sites, R 60-79% restenosed ICA; L <40% restenosed ICA  . H/O endarterectomy 01/08/2012   doppler - R distal common carotid/proximal ICA stenosed 80-99%, L 0-39%  . HTN (hypertension) 11/27/2012  . Hyperlipidemia   . Hypertension   . Limb pain 05/02/2011   doppler - no evidence of thrombus of thrombophlebitis  . Long term (current) use of anticoagulants   . Loss of weight   . Lumbago   . Myalgia and myositis, unspecified   . Myocardial infarction ([place])   . Nonspecific abnormal results of liver function study   . Nontoxic uninodular goiter   . Osteoarthrosis, unspecified whether generalized or localized, unspecified site   . Other malaise and fatigue   . Other manifestations of vitamin A deficiency   . Pain in joint, pelvic region and thigh   . Pain in limb   . Peripheral vascular disease ([place])   . Phlebitis and thrombophlebitis of superficial vessels of lower extremities   . Primary localized osteoarthrosis, hand   . Pulmonary HTN ([place]) 12/03/2012  . Renal artery stenosis ([place])   . Routine gynecological examination   . Spinal stenosis, unspecified region other than cervical   . Stenosis of artery ([facility]) 06/03/2012   doppler- most  distal aspect of abd aorta no aneurysmal dilation; bilateral ABIs - mild L arterial insufficiency, L CIA narrowing w/ 50-69% diameter reduction; L and R SFA both w/ 0-49% diameter reductions  . Stroke ([place]) 2001  . Superficial vein thrombosis 05/11/2015   Left forehead  . Tobacco use disorder   . TR (tricuspid regurgitation), severe 12/02/12 for repair with CABG 12/03/2012  . Type II or unspecified type diabetes mellitus with peripheral circulatory disorders, not stated as uncontrolled(250.70)   . Unspecified cataract   . Unspecified disorder of iris and ciliary body   . Unspecified vitamin D  deficiency   . Urinary tract infection, site not specified     Past Surgical History:  Procedure Laterality Date  . ABDOMINAL HYSTERECTOMY  1975  . ANGIOPLASTY  1984 and 1982  . APPENDECTOMY  1954  . ARCH AORTOGRAM  03/30/2011   40% recurrent stenosis at origin of R proximal carotid patch, shelf like recurrent stenosis w/in midsection of patch that does not appear to be flow limiting; normal non-stenosed great vessel origins  . BACK SURGERY  '84,'86, '87   three previous surgeries  . CAROTID ENDARTERECTOMY  2001   Bilateral  . CORONARY ARTERY BYPASS GRAFT  12/04/2012   Procedure: CORONARY ARTERY BYPASS GRAFTING (CABG);  Surgeon: Ivin Poot, MD; LIMA-LAD, SVG-OM, SVG-RCA  . INTRAOPERATIVE TRANSESOPHAGEAL ECHOCARDIOGRAM  12/04/2012   Procedure: INTRAOPERATIVE TRANSESOPHAGEAL ECHOCARDIOGRAM;  Surgeon: Ivin Poot, MD;  Location: [facility];  Service: Open Heart Surgery;  Laterality: N/A;  . KIDNEY STONE SURGERY     Removal  . LEFT HEART CATHETERIZATION WITH CORONARY ANGIOGRAM N/A 11/28/2012   Procedure: LEFT HEART CATHETERIZATION WITH CORONARY ANGIOGRAM;  Surgeon: Leonie Man, MD;  Location: [facility] CATH LAB;  Service: Cardiovascular;  Laterality: N/A;  . PERCUTANEOUS CORONARY STENT INTERVENTION (PCI-S)  2002   2 BMS in ostial/Prox & mid RCA (mid 2.5 mm 20x  mm, & 2.75 mm x  8 mm ostial)  . RENAL ANGIOGRAM N/A 11/30/2013   Procedure: RENAL ANGIOGRAM;  Surgeon: Lorretta Harp, MD;  Location: [facility] CATH LAB;  Service: Cardiovascular;  Laterality: N/A;  . RIGHT HEART CATHETERIZATION N/A 12/02/2012   Procedure: RIGHT HEART CATH;  Surgeon: Troy Sine, MD;  Location: [facility] CATH LAB;  Service: Cardiovascular;  Laterality: N/A;  . TEE WITH CARDIOVERSION  01/23/2011   EF 37-62%; grade 2 diastolic dysfunction, elevated mean LA filling pressure, RV systolic pressure increased consistent w/ mod pulmonary hypertension     reports that she quit smoking about 5 years ago. Her smoking use included  cigarettes. She has a 17.10 pack-year smoking history. she has never used smokeless tobacco. She reports that she drinks about 0.6 oz of alcohol per week. She reports that she does not use drugs. Social History   Socioeconomic History  . Marital status: Widowed    Spouse name: Not on file  . Number of children: Not on file  . Years of education: Not on file  . Highest education level: Not on file  Social Needs  . Financial resource strain: Not on file  . Food insecurity - worry: Not on file  . Food insecurity - inability: Not on file  . Transportation needs - medical: Not on file  . Transportation needs - non-medical: Not on file  Occupational History  . Not on file  Tobacco Use  . Smoking status: Former Smoker    Packs/day: 0.30    Years: 57.00    Pack years: 17.10  Types: Cigarettes    Last attempt to quit: 11/19/2012    Years since quitting: 5.0  . Smokeless tobacco: Never Used  Substance and Sexual Activity  . Alcohol use: Yes    Alcohol/week: 0.6 oz    Types: 1 Glasses of wine per week  . Drug use: No  . Sexual activity: No  Other Topics Concern  . Not on file  Social History Narrative  . Not on file    Family History  Problem Relation Age of Onset  . Other Mother        CVA  . Diabetes Mother   . Heart disease Mother        Heart disease before age 14  . Heart attack Mother   . Heart disease Father        Heart Disease before age 69  . Heart attack Father   . Heart disease Sister        Amputation  . Diabetes Sister   . Heart disease Brother        Heart disease before age 6  . Diabetes Brother   . Stroke Brother   . Stroke Brother   . Heart disease Brother   . Stroke Brother   . Heart disease Brother   . Heart disease Sister   . Heart disease Sister   . Diabetes Daughter   . Cancer Sister   . Diabetes Other   . Cancer Other        breast  . Heart disease Other        cad  . Other Other        cva    Allergies  Allergen Reactions  .  Iron Hives  . Vicodin [Hydrocodone-Acetaminophen] Other (See Comments)    Caused arrythmia   . Amiodarone Nausea And Vomiting  . Norvasc [Amlodipine Besylate] Nausea And Vomiting  . Promethazine Other (See Comments)    hallucinations  . Vioxx [Rofecoxib] Nausea And Vomiting    Outpatient Encounter Medications as of 12/03/2017  Medication Sig  . alendronate (FOSAMAX) 70 MG tablet Take 70 mg by mouth once a week.   [name] apixaban (ELIQUIS) 5 MG TABS tablet Take 1 tablet (5 mg total) by mouth 2 (two) times daily.  [name] aspirin 81 MG tablet Take 81 mg by mouth daily.   . carvedilol (COREG) 25 MG tablet TAKE ONE-HALF (1/2) TABLET TWICE A DAY WITH MEALS  . cloNIDine (CATAPRES) 0.1 MG tablet Take 0.1 mg by mouth as needed (SYSTOLIC BP > 382).  [name] LUMIGAN 0.01 % SOLN Place 1 drop into both eyes at bedtime.   . metFORMIN (GLUCOPHAGE) 1000 MG tablet TAKE 1 TABLET TWICE A DAY WITH MEALS  . nitroGLYCERIN (NITROSTAT) 0.4 MG SL tablet Place 0.4 mg under the tongue every 5 (five) minutes as needed for chest pain.  . rosuvastatin (CRESTOR) 40 MG tablet Take one tablet by mouth once daily for cholesterol  . Vitamin D, Ergocalciferol, (DRISDOL) 50000 units CAPS capsule Take 1 capsule by mouth once a week.   No facility-administered encounter medications on file as of 12/03/2017.     Review of Systems:  Review of Systems  HENT: Positive for trouble swallowing.   Musculoskeletal: Positive for neck pain.  All other systems reviewed and are negative.   Health Maintenance  Topic Date Due  . FOOT EXAM  01/12/2016  . OPHTHALMOLOGY EXAM  09/18/2017  . HEMOGLOBIN A1C  10/15/2017  . URINE MICROALBUMIN  04/15/2018  . TETANUS/TDAP  11/13/2021  .  DEXA SCAN  Completed  . PNA vac Low Risk Adult  Completed  . INFLUENZA VACCINE  Discontinued    Physical Exam: Vitals:   12/03/17 0942  BP: 124/88  Pulse: (!) 50  Temp: 97.6 F (36.4 C)  TempSrc: Oral  SpO2: 97%  Weight: 161 lb (73 kg)  Height: 5\' 2"  (1.575 m)    Body mass index is 29.45 kg/m. Physical Exam  Constitutional: She is oriented to person, place, and time. She appears well-developed and well-nourished.  HENT:  Mouth/Throat: Oropharynx is clear and moist.  MMM; no oral thrush  Eyes: Pupils are equal, round, and reactive to light. No scleral icterus.  Neck: Neck supple. Muscular tenderness present. Carotid bruit is present (right). Decreased range of motion present.    Cardiovascular: Intact distal pulses. An irregularly irregular rhythm present. Bradycardia present. Exam reveals no gallop and no friction rub.  Murmur (1/6 SEM) heard. Pulmonary/Chest: Effort normal and breath sounds normal. No respiratory distress. She has no wheezes. She has no rales. She exhibits no tenderness.  Musculoskeletal: She exhibits edema and tenderness.  Neurological: She is alert and oriented to person, place, and time. She has normal reflexes.  Skin: Skin is warm and dry. No rash noted.  Psychiatric: She has a normal mood and affect. Her behavior is normal. Judgment and thought content normal.    Labs reviewed: Basic Metabolic Panel: Recent Labs    12/07/16 0001 04/15/17 0906  NA 136 138  K 4.5 4.7  CL 103 102  CO2 25 27  GLUCOSE 207* 91  BUN 25 23  CREATININE 1.32* 1.38*  CALCIUM 9.1 9.1   Liver Function Tests: Recent Labs    12/07/16 0001 04/15/17 0906  AST 15 14  ALT 8 6  ALKPHOS 38 38  BILITOT 0.9 0.6  PROT 6.5 6.7  ALBUMIN 3.9 3.6   No results for input(s): LIPASE, AMYLASE in the last 8760 hours. No results for input(s): AMMONIA in the last 8760 hours. CBC: No results for input(s): WBC, NEUTROABS, HGB, HCT, MCV, PLT in the last 8760 hours. Lipid Panel: Recent Labs    12/07/16 0001 04/15/17 0906  CHOL 145 164  HDL 50* 55  LDLCALC 82 95  TRIG 63 68  CHOLHDL 2.9 3.0   Lab Results  Component Value Date   HGBA1C 6.4 (H) 04/15/2017    Procedures since last visit: No results found.  Assessment/Plan   ICD-10-CM     1. Neck pain on left side M54.2 DG Cervical Spine Complete  2. Muscle spasms of neck M62.838 tizanidine (ZANAFLEX) 2 MG capsule  3. Primary osteoarthritis involving multiple joints M15.0    Will call with xray results  START ZANAFLEX (TIZANIDINE) 1 TABLET UP TO 3 TIMES DAILY AS NEEDED FOR MUSCLE SPASMS  Take Tylenol ES as needed for pain  Continue warm compresses as needed for pain. May alternate with cool compress as needed - 15 minutes at least 3 times daily  Follow up with Dr Mariea Clonts as scheduled or sooner if need be.   Kendra Todd  [facility] [address] [place], [place] [address] [phone] Cell (Monday-Friday 8 AM - 5 PM) [phone] After 5 PM and follow prompts

## 2017-12-03 NOTE — Patient Instructions (Signed)
Will call with xray results  START ZANAFLEX (TIZANIDINE) 1 TABLET UP TO 3 TIMES DAILY AS NEEDED FOR MUSCLE SPASMS  Take Tylenol ES as needed for pain  Continue warm compresses as needed for pain. May alternate with cool compress as needed - 15 minutes at least 3 times daily  Follow up with Dr Mariea Clonts as scheduled or sooner if need be.

## 2017-12-16 DIAGNOSIS — I701 Atherosclerosis of renal artery: Secondary | ICD-10-CM | POA: Diagnosis not present

## 2017-12-16 DIAGNOSIS — N2581 Secondary hyperparathyroidism of renal origin: Secondary | ICD-10-CM | POA: Diagnosis not present

## 2017-12-16 DIAGNOSIS — I129 Hypertensive chronic kidney disease with stage 1 through stage 4 chronic kidney disease, or unspecified chronic kidney disease: Secondary | ICD-10-CM | POA: Diagnosis not present

## 2017-12-16 DIAGNOSIS — Z9889 Other specified postprocedural states: Secondary | ICD-10-CM | POA: Diagnosis not present

## 2017-12-16 DIAGNOSIS — N183 Chronic kidney disease, stage 3 (moderate): Secondary | ICD-10-CM | POA: Diagnosis not present

## 2017-12-16 DIAGNOSIS — I4891 Unspecified atrial fibrillation: Secondary | ICD-10-CM | POA: Diagnosis not present

## 2017-12-16 DIAGNOSIS — E1122 Type 2 diabetes mellitus with diabetic chronic kidney disease: Secondary | ICD-10-CM | POA: Diagnosis not present

## 2017-12-24 ENCOUNTER — Other Ambulatory Visit: Payer: Medicare Other

## 2017-12-24 DIAGNOSIS — N183 Chronic kidney disease, stage 3 unspecified: Secondary | ICD-10-CM

## 2017-12-24 DIAGNOSIS — I480 Paroxysmal atrial fibrillation: Secondary | ICD-10-CM

## 2017-12-24 DIAGNOSIS — D649 Anemia, unspecified: Secondary | ICD-10-CM | POA: Diagnosis not present

## 2017-12-24 DIAGNOSIS — E7800 Pure hypercholesterolemia, unspecified: Secondary | ICD-10-CM | POA: Diagnosis not present

## 2017-12-24 DIAGNOSIS — I2510 Atherosclerotic heart disease of native coronary artery without angina pectoris: Secondary | ICD-10-CM

## 2017-12-24 DIAGNOSIS — E1122 Type 2 diabetes mellitus with diabetic chronic kidney disease: Secondary | ICD-10-CM | POA: Diagnosis not present

## 2017-12-24 DIAGNOSIS — R5383 Other fatigue: Secondary | ICD-10-CM | POA: Diagnosis not present

## 2017-12-26 ENCOUNTER — Ambulatory Visit (INDEPENDENT_AMBULATORY_CARE_PROVIDER_SITE_OTHER): Payer: Medicare Other

## 2017-12-26 ENCOUNTER — Encounter: Payer: Self-pay | Admitting: Internal Medicine

## 2017-12-26 ENCOUNTER — Ambulatory Visit: Payer: Medicare Other | Admitting: Internal Medicine

## 2017-12-26 VITALS — BP 136/78 | HR 72 | Temp 98.1°F | Wt 162.0 lb

## 2017-12-26 VITALS — BP 164/80 | HR 72 | Temp 98.1°F | Ht 62.0 in | Wt 162.0 lb

## 2017-12-26 DIAGNOSIS — N183 Chronic kidney disease, stage 3 unspecified: Secondary | ICD-10-CM

## 2017-12-26 DIAGNOSIS — I480 Paroxysmal atrial fibrillation: Secondary | ICD-10-CM

## 2017-12-26 DIAGNOSIS — M150 Primary generalized (osteo)arthritis: Secondary | ICD-10-CM

## 2017-12-26 DIAGNOSIS — E1122 Type 2 diabetes mellitus with diabetic chronic kidney disease: Secondary | ICD-10-CM

## 2017-12-26 DIAGNOSIS — I10 Essential (primary) hypertension: Secondary | ICD-10-CM

## 2017-12-26 DIAGNOSIS — I2510 Atherosclerotic heart disease of native coronary artery without angina pectoris: Secondary | ICD-10-CM | POA: Diagnosis not present

## 2017-12-26 DIAGNOSIS — M159 Polyosteoarthritis, unspecified: Secondary | ICD-10-CM

## 2017-12-26 DIAGNOSIS — Z0000 Encounter for general adult medical examination without abnormal findings: Secondary | ICD-10-CM

## 2017-12-26 DIAGNOSIS — D649 Anemia, unspecified: Secondary | ICD-10-CM

## 2017-12-26 LAB — LIPID PANEL
Cholesterol: 171 mg/dL (ref <200)
HDL: 51 mg/dL (ref >50)
LDL Cholesterol (Calc): 103 mg/dL (calc) — ABNORMAL HIGH
Non-HDL Cholesterol (Calc): 120 mg/dL (calc) (ref <130)
Total CHOL/HDL Ratio: 3.4 (calc) (ref <5.0)
Triglycerides: 81 mg/dL (ref <150)

## 2017-12-26 LAB — COMPLETE METABOLIC PANEL WITH GFR
AG Ratio: 1.6 (calc) (ref 1.0–2.5)
ALT: 7 U/L (ref 6–29)
AST: 16 U/L (ref 10–35)
Albumin: 4 g/dL (ref 3.6–5.1)
Alkaline phosphatase (APISO): 45 U/L (ref 33–130)
BUN/Creatinine Ratio: 18 (calc) (ref 6–22)
BUN: 28 mg/dL — ABNORMAL HIGH (ref 7–25)
CO2: 28 mmol/L (ref 20–32)
Calcium: 9.2 mg/dL (ref 8.6–10.4)
Chloride: 100 mmol/L (ref 98–110)
Creat: 1.54 mg/dL — ABNORMAL HIGH (ref 0.60–0.93)
GFR, Est African American: 37 mL/min/{1.73_m2} — ABNORMAL LOW (ref >=60)
GFR, Est Non African American: 32 mL/min/{1.73_m2} — ABNORMAL LOW (ref >=60)
Globulin: 2.5 g/dL (calc) (ref 1.9–3.7)
Glucose, Bld: 108 mg/dL — ABNORMAL HIGH (ref 65–99)
Potassium: 4.7 mmol/L (ref 3.5–5.3)
Sodium: 135 mmol/L (ref 135–146)
Total Bilirubin: 0.5 mg/dL (ref 0.2–1.2)
Total Protein: 6.5 g/dL (ref 6.1–8.1)

## 2017-12-26 LAB — HEMOGLOBIN A1C
Hgb A1c MFr Bld: 6.4 % of total Hgb — ABNORMAL HIGH (ref <5.7)
Mean Plasma Glucose: 137 (calc)
eAG (mmol/L): 7.6 (calc)

## 2017-12-26 LAB — TEST AUTHORIZATION

## 2017-12-26 LAB — CBC WITH DIFFERENTIAL/PLATELET
Basophils Absolute: 62 cells/uL (ref 0–200)
Basophils Relative: 1.2 %
Eosinophils Absolute: 208 cells/uL (ref 15–500)
Eosinophils Relative: 4 %
HCT: 32.8 % — ABNORMAL LOW (ref 35.0–45.0)
Hemoglobin: 10.4 g/dL — ABNORMAL LOW (ref 11.7–15.5)
Lymphs Abs: 1149 cells/uL (ref 850–3900)
MCH: 24.9 pg — ABNORMAL LOW (ref 27.0–33.0)
MCHC: 31.7 g/dL — ABNORMAL LOW (ref 32.0–36.0)
MCV: 78.5 fL — ABNORMAL LOW (ref 80.0–100.0)
MPV: 10.6 fL (ref 7.5–12.5)
Monocytes Relative: 12.7 %
Neutro Abs: 3120 cells/uL (ref 1500–7800)
Neutrophils Relative %: 60 %
Platelets: 184 10*3/uL (ref 140–400)
RBC: 4.18 10*6/uL (ref 3.80–5.10)
RDW: 15.4 % — ABNORMAL HIGH (ref 11.0–15.0)
Total Lymphocyte: 22.1 %
WBC mixed population: 660 cells/uL (ref 200–950)
WBC: 5.2 10*3/uL (ref 3.8–10.8)

## 2017-12-26 LAB — B12 AND FOLATE PANEL
Folate: 12.5 ng/mL
Vitamin B-12: 378 pg/mL (ref 200–1100)

## 2017-12-26 MED ORDER — HYDRALAZINE HCL 25 MG PO TABS: 25.0000 mg | ORAL_TABLET | Freq: Three times a day (TID) | ORAL | 3 refills | Status: DC

## 2017-12-26 NOTE — Patient Instructions (Addendum)
Continue to check your blood pressure daily and record the times you are checking it.  We will see how the hydralazine three times a day works for the blood pressure.  Stop the clonidine.  Iron Deficiency Anemia, Adult Iron deficiency anemia is a condition in which the concentration of red blood cells or hemoglobin in the blood is below normal because of too little iron. Hemoglobin is a substance in red blood cells that carries oxygen to the body's tissues. When the concentration of red blood cells or hemoglobin is too low, not enough oxygen reaches these tissues. Iron deficiency anemia is usually long-lasting (chronic) and it develops over time. It may or may not cause symptoms. It is a common type of anemia. What are the causes? This condition may be caused by:  Not enough iron in the diet.  Blood loss caused by bleeding in the intestine.  Blood loss from a gastrointestinal condition like Crohn disease.  Frequent blood draws, such as from blood donation.  Abnormal absorption in the gut.  Heavy menstrual periods in women.  Cancers of the gastrointestinal system, such as colon cancer.  What are the signs or symptoms? Symptoms of this condition may include:  Fatigue.  Headache.  Pale skin, lips, and nail beds.  Poor appetite.  Weakness.  Shortness of breath.  Dizziness.  Cold hands and feet.  Fast or irregular heartbeat.  Irritability. This is more common in severe anemia.  Rapid breathing. This is more common in severe anemia.  Mild anemia may not cause any symptoms. How is this diagnosed? This condition is diagnosed based on:  Your medical history.  A physical exam.  Blood tests.  You may have additional tests to find the underlying cause of your anemia, such as:  Testing for blood in the stool (fecal occult blood test).  A procedure to see inside your colon and rectum (colonoscopy).  A procedure to see inside your esophagus and stomach  (endoscopy).  A test in which cells are removed from bone marrow (bone marrow aspiration) or fluid is removed from the bone marrow to be examined (biopsy). This is rarely needed.  How is this treated? This condition is treated by correcting the cause of your iron deficiency. Treatment may involve:  Adding iron-rich foods to your diet.  Taking iron supplements. If you are pregnant or breastfeeding, you may need to take extra iron because your normal diet usually does not provide the amount of iron that you need.  Increasing vitamin C intake. Vitamin C helps your body absorb iron. Your health care provider may recommend that you take iron supplements along with a glass of orange juice or a vitamin C supplement.  Medicines to make heavy menstrual flow lighter.  Surgery.  You may need repeat blood tests to determine whether treatment is working. Depending on the underlying cause, the anemia should be corrected within 2 months of starting treatment. If the treatment does not seem to be working, you may need more testing. Follow these instructions at home: Medicines  Take over-the-counter and prescription medicines only as told by your health care provider. This includes iron supplements and vitamins.  If you cannot tolerate taking iron supplements by mouth, talk with your health care provider about taking them through a vein (intravenously) or an injection into a muscle.  For the best iron absorption, you should take iron supplements when your stomach is empty. If you cannot tolerate them on an empty stomach, you may need to take them with  food.  Do not drink milk or take antacids at the same time as your iron supplements. Milk and antacids may interfere with iron absorption.  Iron supplements can cause constipation. To prevent constipation, include fiber in your diet as told by your health care provider. A stool softener may also be recommended. Eating and drinking  Talk with your health  care provider before changing your diet. He or she may recommend that you eat foods that contain a lot of iron, such as: ? Liver. ? Low-fat (lean) beef. ? Breads and cereals that have iron added to them (are fortified). ? Eggs. ? Dried fruit. ? Dark green, leafy vegetables.  To help your body use the iron from iron-rich foods, eat those foods at the same time as fresh fruits and vegetables that are high in vitamin C. Foods that are high in vitamin C include: ? Oranges. ? Peppers. ? Tomatoes. ? Mangoes.  Drinkenoughfluid to keep your urine clear or pale yellow. General instructions  Return to your normal activities as told by your health care provider. Ask your health care provider what activities are safe for you.  Practice good hygiene. Anemia can make you more prone to illness and infection.  Keep all follow-up visits as told by your health care provider. This is important. Contact a health care provider if:  You feel nauseous or you vomit.  You feel weak.  You have unexplained sweating.  You develop symptoms of constipation, such as: ? Having fewer than three bowel movements a week. ? Straining to have a bowel movement. ? Having stools that are hard, dry, or larger than normal. ? Feeling full or bloated. ? Pain in the lower abdomen. ? Not feeling relief after having a bowel movement. Get help right away if:  You faint. If this happens, do not drive yourself to the hospital. Call your local emergency services (911 in the [place]  You have chest pain.  You have shortness of breath that: ? Is severe. ? Gets worse with physical activity.  You have a rapid heartbeat.  You become light-headed when getting up from a sitting or lying down position. This information is not intended to replace advice given to you by your health care provider. Make sure you discuss any questions you have with your health care provider. Document Released: 10/19/2000 Document Revised:  07/11/2016 Document Reviewed: 07/11/2016 Elsevier Interactive Patient Education  2018 [employer].

## 2017-12-26 NOTE — Progress Notes (Signed)
Subjective:   Kendra Todd is a 77 y.o. female who presents for Medicare Annual (Subsequent) preventive examination.  Last AWV-12/12/2016    Objective:     Vitals: BP (!) 164/80 (BP Location: Right Arm, Patient Position: Sitting)   Pulse 72   Temp 98.1 F (36.7 C) (Oral)   Ht 5\' 2"  (1.575 m)   Wt 162 lb (73.5 kg)   SpO2 95%   BMI 29.63 kg/m   Body mass index is 29.63 kg/m.  Provider notified of BP  Advanced Directives 12/26/2017 08/15/2017 05/30/2017 04/10/2017 12/12/2016 12/12/2016 08/08/2016  Does Patient Have a Medical Advance Directive? Yes No Yes Yes Yes Yes Yes  Type of Paramedic of [place];Living will - [place];Living will Living will Living will Living will [place];Living will  Does patient want to make changes to medical advance directive? No - Patient declined - - - - - -  Copy of [place] in Chart? No - copy requested - - - - - No - copy requested  Would patient like information on creating a medical advance directive? - No - Patient declined - - - - -  Pre-existing out of facility DNR order (yellow form or pink MOST form) - - - - - - -    Tobacco Social History   Tobacco Use  Smoking Status Former Smoker  . Packs/day: 0.30  . Years: 57.00  . Pack years: 17.10  . Types: Cigarettes  . Last attempt to quit: 11/19/2012  . Years since quitting: 5.1  Smokeless Tobacco Never Used     Counseling given: Not Answered   Clinical Intake:  Pre-visit preparation completed: No  Pain : No/denies pain     Nutritional Risks: None Diabetes: Yes CBG done?: No Did pt. bring in CBG monitor from home?: No  How often do you need to have someone help you when you read instructions, pamphlets, or other written materials from your doctor or pharmacy?: 1 - Never What is the last grade level you completed in school?: HS  Interpreter Needed?: No  Information entered by :: Tyson Dense,  RN  Past Medical History:  Diagnosis Date  . Abdominal pain, epigastric   . Acute upper respiratory infections of unspecified site   . Arthritis   . Atrial fibrillation ([place]) 02/21/2011  . Benign paroxysmal positional vertigo   . CAD (coronary artery disease)    prior coronary stenting  . Candidiasis of vulva and vagina   . Carotid artery disease ([place]) 2001   s/p Bilateral CEA, after CVA  . Cervicalgia   . Decreased pedal pulses 06/04/2011   doppler - bilateral ABIs no evidence of insufficiency; L CIA slightly elevated velocities 20-30% diameter reduction;   . Dyspnea 03/13/2006   Myoview - EF 76%; mild ischemia in apical lateral region, less severe than previous study in 2002  . Edema   . H/O carotid endarterectomy 03/06/2012   CT angio -high grade stenosis of proximal R internal carotid w/ lg posterior ulceration or dissection flap; near occlusive stenosis at origin of nondominantproximal L vertebral artery; <50% stenosis of proximal R vertebral and proximal L subclavian artery  . H/O endarterectomy 09/11/2012   patent L and R carotid sites, R 60-79% restenosed ICA; L <40% restenosed ICA  . H/O endarterectomy 01/08/2012   doppler - R distal common carotid/proximal ICA stenosed 80-99%, L 0-39%  . HTN (hypertension) 11/27/2012  . Hyperlipidemia   . Hypertension   .  Limb pain 05/02/2011   doppler - no evidence of thrombus of thrombophlebitis  . Long term (current) use of anticoagulants   . Loss of weight   . Lumbago   . Myalgia and myositis, unspecified   . Myocardial infarction ([place])   . Nonspecific abnormal results of liver function study   . Nontoxic uninodular goiter   . Osteoarthrosis, unspecified whether generalized or localized, unspecified site   . Other malaise and fatigue   . Other manifestations of vitamin A deficiency   . Pain in joint, pelvic region and thigh   . Pain in limb   . Peripheral vascular disease ([place])   . Phlebitis and thrombophlebitis of superficial vessels  of lower extremities   . Primary localized osteoarthrosis, hand   . Pulmonary HTN ([place]) 12/03/2012  . Renal artery stenosis ([place])   . Routine gynecological examination   . Spinal stenosis, unspecified region other than cervical   . Stenosis of artery ([place]) 06/03/2012   doppler- most distal aspect of abd aorta no aneurysmal dilation; bilateral ABIs - mild L arterial insufficiency, L CIA narrowing w/ 50-69% diameter reduction; L and R SFA both w/ 0-49% diameter reductions  . Stroke ([place]) 2001  . Superficial vein thrombosis 05/11/2015   Left forehead  . Tobacco use disorder   . TR (tricuspid regurgitation), severe 12/02/12 for repair with CABG 12/03/2012  . Type II or unspecified type diabetes mellitus with peripheral circulatory disorders, not stated as uncontrolled(250.70)   . Unspecified cataract   . Unspecified disorder of iris and ciliary body   . Unspecified vitamin D deficiency   . Urinary tract infection, site not specified    Past Surgical History:  Procedure Laterality Date  . ABDOMINAL HYSTERECTOMY  1975  . ANGIOPLASTY  1984 and 1982  . APPENDECTOMY  1954  . ARCH AORTOGRAM  03/30/2011   40% recurrent stenosis at origin of R proximal carotid patch, shelf like recurrent stenosis w/in midsection of patch that does not appear to be flow limiting; normal non-stenosed great vessel origins  . BACK SURGERY  '84,'86, '87   three previous surgeries  . CAROTID ENDARTERECTOMY  2001   Bilateral  . CORONARY ARTERY BYPASS GRAFT  12/04/2012   Procedure: CORONARY ARTERY BYPASS GRAFTING (CABG);  Surgeon: Ivin Poot, MD; LIMA-LAD, SVG-OM, SVG-RCA  . INTRAOPERATIVE TRANSESOPHAGEAL ECHOCARDIOGRAM  12/04/2012   Procedure: INTRAOPERATIVE TRANSESOPHAGEAL ECHOCARDIOGRAM;  Surgeon: Ivin Poot, MD;  Location: [place];  Service: Open Heart Surgery;  Laterality: N/A;  . KIDNEY STONE SURGERY     Removal  . LEFT HEART CATHETERIZATION WITH CORONARY ANGIOGRAM N/A 11/28/2012   Procedure: LEFT HEART  CATHETERIZATION WITH CORONARY ANGIOGRAM;  Surgeon: Leonie Man, MD;  Location: [facility] CATH LAB;  Service: Cardiovascular;  Laterality: N/A;  . PERCUTANEOUS CORONARY STENT INTERVENTION (PCI-S)  2002   2 BMS in ostial/Prox & mid RCA (mid 2.5 mm 20x  mm, & 2.75 mm x  8 mm ostial)  . RENAL ANGIOGRAM N/A 11/30/2013   Procedure: RENAL ANGIOGRAM;  Surgeon: Lorretta Harp, MD;  Location: [facility] CATH LAB;  Service: Cardiovascular;  Laterality: N/A;  . RIGHT HEART CATHETERIZATION N/A 12/02/2012   Procedure: RIGHT HEART CATH;  Surgeon: Troy Sine, MD;  Location: [facility] CATH LAB;  Service: Cardiovascular;  Laterality: N/A;  . TEE WITH CARDIOVERSION  01/23/2011   EF 09-60%; grade 2 diastolic dysfunction, elevated mean LA filling pressure, RV systolic pressure increased consistent w/ mod pulmonary hypertension   Family History  Problem Relation  Age of Onset  . Other Mother        CVA  . Diabetes Mother   . Heart disease Mother        Heart disease before age 41  . Heart attack Mother   . Heart disease Father        Heart Disease before age 60  . Heart attack Father   . Heart disease Sister        Amputation  . Diabetes Sister   . Heart disease Brother        Heart disease before age 65  . Diabetes Brother   . Stroke Brother   . Stroke Brother   . Heart disease Brother   . Stroke Brother   . Heart disease Brother   . Heart disease Sister   . Heart disease Sister   . Diabetes Daughter   . Cancer Sister   . Diabetes Other   . Cancer Other        breast  . Heart disease Other        cad  . Other Other        cva   Social History   Socioeconomic History  . Marital status: Widowed    Spouse name: None  . Number of children: None  . Years of education: None  . Highest education level: None  Social Needs  . Financial resource strain: Not hard at all  . Food insecurity - worry: Never true  . Food insecurity - inability: Never true  . Transportation needs - medical: No  . Transportation  needs - non-medical: No  Occupational History  . None  Tobacco Use  . Smoking status: Former Smoker    Packs/day: 0.30    Years: 57.00    Pack years: 17.10    Types: Cigarettes    Last attempt to quit: 11/19/2012    Years since quitting: 5.1  . Smokeless tobacco: Never Used  Substance and Sexual Activity  . Alcohol use: Yes    Comment: rarely, 1 a month  . Drug use: No  . Sexual activity: No  Other Topics Concern  . None  Social History Narrative  . None    Outpatient Encounter Medications as of 12/26/2017  Medication Sig  . alendronate (FOSAMAX) 70 MG tablet Take 70 mg by mouth once a week.   [name] apixaban (ELIQUIS) 5 MG TABS tablet Take 1 tablet (5 mg total) by mouth 2 (two) times daily.  [name] aspirin 81 MG tablet Take 81 mg by mouth daily.   . carvedilol (COREG) 25 MG tablet TAKE ONE-HALF (1/2) TABLET TWICE A DAY WITH MEALS  . cloNIDine (CATAPRES) 0.1 MG tablet Take 0.1 mg by mouth as needed (SYSTOLIC BP > 841).  [name] LUMIGAN 0.01 % SOLN Place 1 drop into both eyes at bedtime.   . metFORMIN (GLUCOPHAGE) 1000 MG tablet TAKE 1 TABLET TWICE A DAY WITH MEALS  . nitroGLYCERIN (NITROSTAT) 0.4 MG SL tablet Place 0.4 mg under the tongue every 5 (five) minutes as needed for chest pain.  . rosuvastatin (CRESTOR) 40 MG tablet Take one tablet by mouth once daily for cholesterol  . tizanidine (ZANAFLEX) 2 MG capsule Take 1 capsule (2 mg total) by mouth 3 (three) times daily.  . [DISCONTINUED] Vitamin D, Ergocalciferol, (DRISDOL) 50000 units CAPS capsule Take 1 capsule by mouth once a week.   No facility-administered encounter medications on file as of 12/26/2017.     Activities of Daily Living In your present  state of health, do you have any difficulty performing the following activities: 12/26/2017  Hearing? N  Vision? Y  Difficulty concentrating or making decisions? N  Walking or climbing stairs? Y  Dressing or bathing? N  Doing errands, shopping? N  Preparing Food and eating ? N  Using  the Toilet? N  In the past six months, have you accidently leaked urine? N  Do you have problems with loss of bowel control? N  Managing your Medications? N  Managing your Finances? N  Housekeeping or managing your Housekeeping? N  Some recent data might be hidden    Patient Care Team: [name], DO as PCP - General (Geriatric Medicine) [name], MD as Attending Physician (Cardiology) Marylynn Pearson, MD as Consulting Physician (Ophthalmology) Madelon Lips, MD as Consulting Physician (Nephrology) Valinda Party, MD (Rheumatology)    Assessment:   This is a routine wellness examination for [place].  Exercise Activities and Dietary recommendations Current Exercise Habits: The patient does not participate in regular exercise at present, Exercise limited by: cardiac condition(s)  Goals    . DIET - INCREASE WATER INTAKE    . Weight (lb) < 155 lb (70.3 kg)     Starting 12/12/16, I will attempt to decrease my weight by 5 lbs, to reach my goal weight of 155 lbs.        Fall Risk Fall Risk  12/26/2017 12/03/2017 08/15/2017 12/12/2016 02/08/2016  Falls in the past year? No No No No No   Is the patient's home free of loose throw rugs in walkways, pet beds, electrical cords, etc?   yes      Grab bars in the bathroom? yes      Handrails on the stairs?   yes      Adequate lighting?   yes  Depression Screen PHQ 2/9 Scores 12/26/2017 08/15/2017 12/12/2016 02/08/2016  PHQ - 2 Score 0 0 0 0  PHQ- 9 Score - - - -     Cognitive Function MMSE - Mini Mental State Exam 12/26/2017 12/12/2016  Orientation to time 4 5  Orientation to Place 5 5  Registration 3 3  Attention/ Calculation 5 5  Recall 2 3  Language- name 2 objects 2 2  Language- repeat 1 1  Language- follow 3 step command 3 3  Language- read & follow direction 1 1  Write a sentence 1 1  Copy design 1 1  Total score 28 30        Immunization History  Administered Date(s) Administered  . Pneumococcal Conjugate-13  12/12/2016  . Pneumococcal Polysaccharide-23 11/14/2011  . Tdap 11/14/2011    Qualifies for Shingles Vaccine yes, educated and declined for now  Screening Tests Health Maintenance  Topic Date Due  . FOOT EXAM  01/12/2016  . OPHTHALMOLOGY EXAM  09/18/2017  . URINE MICROALBUMIN  04/15/2018  . HEMOGLOBIN A1C  06/23/2018  . TETANUS/TDAP  11/13/2021  . DEXA SCAN  Completed  . PNA vac Low Risk Adult  Completed  . INFLUENZA VACCINE  Discontinued    Cancer Screenings: Lung: Low Dose CT Chest recommended if Age 85-80 years, 30 pack-year currently smoking OR have quit w/in 15years. Patient does not qualify. Breast:  Up to date on Mammogram? No   Up to date of Bone Density/Dexa? Yes Colorectal: up to date  Additional Screenings:  Hepatitis C Screening: declined     Plan:    I have personally reviewed and addressed the Medicare Annual Wellness questionnaire and have noted the  following in the patient's chart:  A. Medical and social history B. Use of alcohol, tobacco or illicit drugs  C. Current medications and supplements D. Functional ability and status E.  Nutritional status F.  Physical activity G. Advance directives H. List of other physicians I.  Hospitalizations, surgeries, and ER visits in previous 12 months J.  [place] to include hearing, vision, cognitive, depression L. Referrals and appointments - none  In addition, I have reviewed and discussed with patient certain preventive protocols, quality metrics, and best practice recommendations. A written personalized care plan for preventive services as well as general preventive health recommendations were provided to patient.  See attached scanned questionnaire for additional information.   Signed,   Tyson Dense, RN Nurse Health Advisor  Patient Concerns:None

## 2017-12-26 NOTE — Patient Instructions (Signed)
Kendra Todd , Thank you for taking time to come for your Medicare Wellness Visit. I appreciate your ongoing commitment to your health goals. Please review the following plan we discussed and let me know if I can assist you in the future.   Screening recommendations/referrals: Colonoscopy excluded, you are over age 79 Mammogram-last one 03/09/2014 Bone Density up to date Recommended yearly ophthalmology/optometry visit for glaucoma screening and checkup Recommended yearly dental visit for hygiene and checkup  Vaccinations: Influenza vaccine excluded Pneumococcal vaccine up to date Tdap vaccine up to date, due 11/13/2021 Shingles vaccine due, declined for now  Advanced directives: Please bring [place] a copy of living will and health care power of attorney  Conditions/risks identified: none  Next appointment: Tyson Dense, RN 12/31/2018 @ 10am   Preventive Care 65 Years and Older, Female Preventive care refers to lifestyle choices and visits with your health care provider that can promote health and wellness. What does preventive care include?  A yearly physical exam. This is also called an annual well check.  Dental exams once or twice a year.  Routine eye exams. Ask your health care provider how often you should have your eyes checked.  Personal lifestyle choices, including:  Daily care of your teeth and gums.  Regular physical activity.  Eating a healthy diet.  Avoiding tobacco and drug use.  Limiting alcohol use.  Practicing safe sex.  Taking low-dose aspirin every day.  Taking vitamin and mineral supplements as recommended by your health care provider. What happens during an annual well check? The services and screenings done by your health care provider during your annual well check will depend on your age, overall health, lifestyle risk factors, and family history of disease. Counseling  Your health care provider may ask you questions about your:  Alcohol  use.  Tobacco use.  Drug use.  Emotional well-being.  Home and relationship well-being.  Sexual activity.  Eating habits.  History of falls.  Memory and ability to understand (cognition).  Work and work Statistician.  Reproductive health. Screening  You may have the following tests or measurements:  Height, weight, and BMI.  Blood pressure.  Lipid and cholesterol levels. These may be checked every 5 years, or more frequently if you are over 78 years old.  Skin check.  Lung cancer screening. You may have this screening every year starting at age 39 if you have a 30-pack-year history of smoking and currently smoke or have quit within the past 15 years.  Fecal occult blood test (FOBT) of the stool. You may have this test every year starting at age 47.  Flexible sigmoidoscopy or colonoscopy. You may have a sigmoidoscopy every 5 years or a colonoscopy every 10 years starting at age 26.  Hepatitis C blood test.  Hepatitis B blood test.  Sexually transmitted disease (STD) testing.  Diabetes screening. This is done by checking your blood sugar (glucose) after you have not eaten for a while (fasting). You may have this done every 1-3 years.  Bone density scan. This is done to screen for osteoporosis. You may have this done starting at age 37.  Mammogram. This may be done every 1-2 years. Talk to your health care provider about how often you should have regular mammograms. Talk with your health care provider about your test results, treatment options, and if necessary, the need for more tests. Vaccines  Your health care provider may recommend certain vaccines, such as:  Influenza vaccine. This is recommended every year.  Tetanus,  diphtheria, and acellular pertussis (Tdap, Td) vaccine. You may need a Td booster every 10 years.  Zoster vaccine. You may need this after age 37.  Pneumococcal 13-valent conjugate (PCV13) vaccine. One dose is recommended after age  61.  Pneumococcal polysaccharide (PPSV23) vaccine. One dose is recommended after age 79. Talk to your health care provider about which screenings and vaccines you need and how often you need them. This information is not intended to replace advice given to you by your health care provider. Make sure you discuss any questions you have with your health care provider. Document Released: 11/18/2015 Document Revised: 07/11/2016 Document Reviewed: 08/23/2015 Elsevier Interactive Patient Education  2017 [place] Prevention in the Home Falls can cause injuries. They can happen to people of all ages. There are many things you can do to make your home safe and to help prevent falls. What can I do on the outside of my home?  Regularly fix the edges of walkways and driveways and fix any cracks.  Remove anything that might make you trip as you walk through a door, such as a raised step or threshold.  Trim any bushes or trees on the path to your home.  Use bright outdoor lighting.  Clear any walking paths of anything that might make someone trip, such as rocks or tools.  Regularly check to see if handrails are loose or broken. Make sure that both sides of any steps have handrails.  Any raised decks and porches should have guardrails on the edges.  Have any leaves, snow, or ice cleared regularly.  Use sand or salt on walking paths during winter.  Clean up any spills in your garage right away. This includes oil or grease spills. What can I do in the bathroom?  Use night lights.  Install grab bars by the toilet and in the tub and shower. Do not use towel bars as grab bars.  Use non-skid mats or decals in the tub or shower.  If you need to sit down in the shower, use a plastic, non-slip stool.  Keep the floor dry. Clean up any water that spills on the floor as soon as it happens.  Remove soap buildup in the tub or shower regularly.  Attach bath mats securely with double-sided  non-slip rug tape.  Do not have throw rugs and other things on the floor that can make you trip. What can I do in the bedroom?  Use night lights.  Make sure that you have a light by your bed that is easy to reach.  Do not use any sheets or blankets that are too big for your bed. They should not hang down onto the floor.  Have a firm chair that has side arms. You can use this for support while you get dressed.  Do not have throw rugs and other things on the floor that can make you trip. What can I do in the kitchen?  Clean up any spills right away.  Avoid walking on wet floors.  Keep items that you use a lot in easy-to-reach places.  If you need to reach something above you, use a strong step stool that has a grab bar.  Keep electrical cords out of the way.  Do not use floor polish or wax that makes floors slippery. If you must use wax, use non-skid floor wax.  Do not have throw rugs and other things on the floor that can make you trip. What can I do with  my stairs?  Do not leave any items on the stairs.  Make sure that there are handrails on both sides of the stairs and use them. Fix handrails that are broken or loose. Make sure that handrails are as long as the stairways.  Check any carpeting to make sure that it is firmly attached to the stairs. Fix any carpet that is loose or worn.  Avoid having throw rugs at the top or bottom of the stairs. If you do have throw rugs, attach them to the floor with carpet tape.  Make sure that you have a light switch at the top of the stairs and the bottom of the stairs. If you do not have them, ask someone to add them for you. What else can I do to help prevent falls?  Wear shoes that:  Do not have high heels.  Have rubber bottoms.  Are comfortable and fit you well.  Are closed at the toe. Do not wear sandals.  If you use a stepladder:  Make sure that it is fully opened. Do not climb a closed stepladder.  Make sure that both  sides of the stepladder are locked into place.  Ask someone to hold it for you, if possible.  Clearly mark and make sure that you can see:  Any grab bars or handrails.  First and last steps.  Where the edge of each step is.  Use tools that help you move around (mobility aids) if they are needed. These include:  Canes.  Walkers.  Scooters.  Crutches.  Turn on the lights when you go into a dark area. Replace any light bulbs as soon as they burn out.  Set up your furniture so you have a clear path. Avoid moving your furniture around.  If any of your floors are uneven, fix them.  If there are any pets around you, be aware of where they are.  Review your medicines with your doctor. Some medicines can make you feel dizzy. This can increase your chance of falling. Ask your doctor what other things that you can do to help prevent falls. This information is not intended to replace advice given to you by your health care provider. Make sure you discuss any questions you have with your health care provider. Document Released: 08/18/2009 Document Revised: 03/29/2016 Document Reviewed: 11/26/2014 Elsevier Interactive Patient Education  2017 [employer].

## 2017-12-26 NOTE — Progress Notes (Signed)
Provider:  Rexene Edison. Mariea Clonts, D.O., C.M.D. Location:      Place of Service:     Previous PCP: [name], DO Patient Care Team: [name], DO as PCP - General (Geriatric Medicine) [name], MD as Attending Physician (Cardiology) Marylynn Pearson, MD as Consulting Physician (Ophthalmology) Madelon Lips, MD as Consulting Physician (Nephrology) Valinda Party, MD (Rheumatology)  Extended Emergency Contact Information Primary Emergency Contact: Marissa Nestle of [place] Phone: [phone] Mobile Phone: [phone] Relation: Son Secondary Emergency Contact: Marcie Bal States of [place] Phone: [phone] Relation: Relative  Code Status: FC Goals of Care: Advanced Directive information Advanced Directives 12/26/2017  Does Patient Have a Medical Advance Directive? Yes  Type of Paramedic of [place];Living will  Does patient want to make changes to medical advance directive? No - Patient declined  Copy of [place] in Chart? No - copy requested  Would patient like information on creating a medical advance directive? -  Pre-existing out of facility DNR order (yellow form or pink MOST form) -      Chief Complaint  Patient presents with  . Medical Management of Chronic Issues    44mth follow-up  . ACP    no ACP    HPI: Patient is a 77 y.o. female seen today for medical management of chronic disease.  OA: Weather and joints don't mix well. The dampest and the cold makes her hands, wrist, knee, back achy and stiff. Extra strength tylenol helps some. Warm water helps with hands are bad.   Paroxysmal a.fib: She feels she is in a.fib all the time. She has SOB a good bit. Is followed by cardio. Eliqus was started to help think blood. She is taking it twice a day. She denies chest pain.   CKD: Was seen by Dr Hollie Salk and all labs looked good, stable per her visit there. Was told her iron  is low and she cannot take oral iron due to hives and ER visit because it was so bad.   DMII: Needs test strips (she has been out the last week). Blood sugars running 130's. Overall she feels good with sugar levels  BP:  She has trouble keeping her BP under control. Left arm does not give a true reading. Readings at home range around: 130-140/70. When SBP is over 150 you take clonidine to help reduce it and it will come down to 140's.     Past Medical History:  Diagnosis Date  . Abdominal pain, epigastric   . Acute upper respiratory infections of unspecified site   . Arthritis   . Atrial fibrillation ([place]) 02/21/2011  . Benign paroxysmal positional vertigo   . CAD (coronary artery disease)    prior coronary stenting  . Candidiasis of vulva and vagina   . Carotid artery disease ([place]) 2001   s/p Bilateral CEA, after CVA  . Cervicalgia   . Decreased pedal pulses 06/04/2011   doppler - bilateral ABIs no evidence of insufficiency; L CIA slightly elevated velocities 20-30% diameter reduction;   . Dyspnea 03/13/2006   Myoview - EF 76%; mild ischemia in apical lateral region, less severe than previous study in 2002  . Edema   . H/O carotid endarterectomy 03/06/2012   CT angio -high grade stenosis of proximal R internal carotid w/ lg posterior ulceration or dissection flap; near occlusive stenosis at origin of nondominantproximal L vertebral artery; <50% stenosis of proximal R vertebral and proximal L subclavian artery  .  H/O endarterectomy 09/11/2012   patent L and R carotid sites, R 60-79% restenosed ICA; L <40% restenosed ICA  . H/O endarterectomy 01/08/2012   doppler - R distal common carotid/proximal ICA stenosed 80-99%, L 0-39%  . HTN (hypertension) 11/27/2012  . Hyperlipidemia   . Hypertension   . Limb pain 05/02/2011   doppler - no evidence of thrombus of thrombophlebitis  . Long term (current) use of anticoagulants   . Loss of weight   . Lumbago   . Myalgia and myositis, unspecified   .  Myocardial infarction ([place])   . Nonspecific abnormal results of liver function study   . Nontoxic uninodular goiter   . Osteoarthrosis, unspecified whether generalized or localized, unspecified site   . Other malaise and fatigue   . Other manifestations of vitamin A deficiency   . Pain in joint, pelvic region and thigh   . Pain in limb   . Peripheral vascular disease ([place])   . Phlebitis and thrombophlebitis of superficial vessels of lower extremities   . Primary localized osteoarthrosis, hand   . Pulmonary HTN ([place]) 12/03/2012  . Renal artery stenosis ([place])   . Routine gynecological examination   . Spinal stenosis, unspecified region other than cervical   . Stenosis of artery ([place]) 06/03/2012   doppler- most distal aspect of abd aorta no aneurysmal dilation; bilateral ABIs - mild L arterial insufficiency, L CIA narrowing w/ 50-69% diameter reduction; L and R SFA both w/ 0-49% diameter reductions  . Stroke ([place]) 2001  . Superficial vein thrombosis 05/11/2015   Left forehead  . Tobacco use disorder   . TR (tricuspid regurgitation), severe 12/02/12 for repair with CABG 12/03/2012  . Type II or unspecified type diabetes mellitus with peripheral circulatory disorders, not stated as uncontrolled(250.70)   . Unspecified cataract   . Unspecified disorder of iris and ciliary body   . Unspecified vitamin D deficiency   . Urinary tract infection, site not specified    Past Surgical History:  Procedure Laterality Date  . ABDOMINAL HYSTERECTOMY  1975  . ANGIOPLASTY  1984 and 1982  . APPENDECTOMY  1954  . ARCH AORTOGRAM  03/30/2011   40% recurrent stenosis at origin of R proximal carotid patch, shelf like recurrent stenosis w/in midsection of patch that does not appear to be flow limiting; normal non-stenosed great vessel origins  . BACK SURGERY  '84,'86, '87   three previous surgeries  . CAROTID ENDARTERECTOMY  2001   Bilateral  . CORONARY ARTERY BYPASS GRAFT  12/04/2012   Procedure: CORONARY  ARTERY BYPASS GRAFTING (CABG);  Surgeon: Ivin Poot, MD; LIMA-LAD, SVG-OM, SVG-RCA  . INTRAOPERATIVE TRANSESOPHAGEAL ECHOCARDIOGRAM  12/04/2012   Procedure: INTRAOPERATIVE TRANSESOPHAGEAL ECHOCARDIOGRAM;  Surgeon: Ivin Poot, MD;  Location: [facility];  Service: Open Heart Surgery;  Laterality: N/A;  . KIDNEY STONE SURGERY     Removal  . LEFT HEART CATHETERIZATION WITH CORONARY ANGIOGRAM N/A 11/28/2012   Procedure: LEFT HEART CATHETERIZATION WITH CORONARY ANGIOGRAM;  Surgeon: Leonie Man, MD;  Location: [facility] CATH LAB;  Service: Cardiovascular;  Laterality: N/A;  . PERCUTANEOUS CORONARY STENT INTERVENTION (PCI-S)  2002   2 BMS in ostial/Prox & mid RCA (mid 2.5 mm 20x  mm, & 2.75 mm x  8 mm ostial)  . RENAL ANGIOGRAM N/A 11/30/2013   Procedure: RENAL ANGIOGRAM;  Surgeon: Lorretta Harp, MD;  Location: [facility] CATH LAB;  Service: Cardiovascular;  Laterality: N/A;  . RIGHT HEART CATHETERIZATION N/A 12/02/2012   Procedure: RIGHT HEART CATH;  Surgeon: Troy Sine, MD;  Location[facility] CATH LAB;  Service: Cardiovascular;  Laterality: N/A;  . TEE WITH CARDIOVERSION  01/23/2011   EF 84-69%; grade 2 diastolic dysfunction, elevated mean LA filling pressure, RV systolic pressure increased consistent w/ mod pulmonary hypertension    reports that she quit smoking about 5 years ago. Her smoking use included cigarettes. She has a 17.10 pack-year smoking history. she has never used smokeless tobacco. She reports that she drinks alcohol. She reports that she does not use drugs.  Functional Status Survey:    Family History  Problem Relation Age of Onset  . Other Mother        CVA  . Diabetes Mother   . Heart disease Mother        Heart disease before age 14  . Heart attack Mother   . Heart disease Father        Heart Disease before age 19  . Heart attack Father   . Heart disease Sister        Amputation  . Diabetes Sister   . Heart disease Brother        Heart disease before age 58  . Diabetes  Brother   . Stroke Brother   . Stroke Brother   . Heart disease Brother   . Stroke Brother   . Heart disease Brother   . Heart disease Sister   . Heart disease Sister   . Diabetes Daughter   . Cancer Sister   . Diabetes Other   . Cancer Other        breast  . Heart disease Other        cad  . Other Other        cva    Health Maintenance  Topic Date Due  . FOOT EXAM  01/12/2016  . OPHTHALMOLOGY EXAM  09/18/2017  . URINE MICROALBUMIN  04/15/2018  . HEMOGLOBIN A1C  06/23/2018  . TETANUS/TDAP  11/13/2021  . DEXA SCAN  Completed  . PNA vac Low Risk Adult  Completed  . INFLUENZA VACCINE  Discontinued    Allergies  Allergen Reactions  . Iron Hives  . Vicodin [Hydrocodone-Acetaminophen] Other (See Comments)    Caused arrythmia   . Amiodarone Nausea And Vomiting  . Norvasc [Amlodipine Besylate] Nausea And Vomiting  . Promethazine Other (See Comments)    hallucinations  . Vioxx [Rofecoxib] Nausea And Vomiting    Outpatient Encounter Medications as of 12/26/2017  Medication Sig  . alendronate (FOSAMAX) 70 MG tablet Take 70 mg by mouth once a week.   [name] apixaban (ELIQUIS) 5 MG TABS tablet Take 1 tablet (5 mg total) by mouth 2 (two) times daily.  [name] aspirin 81 MG tablet Take 81 mg by mouth daily.   . carvedilol (COREG) 25 MG tablet TAKE ONE-HALF (1/2) TABLET TWICE A DAY WITH MEALS  . cloNIDine (CATAPRES) 0.1 MG tablet Take 0.1 mg by mouth as needed (SYSTOLIC BP > 629).  [name] LUMIGAN 0.01 % SOLN Place 1 drop into both eyes at bedtime.   . metFORMIN (GLUCOPHAGE) 1000 MG tablet TAKE 1 TABLET TWICE A DAY WITH MEALS  . nitroGLYCERIN (NITROSTAT) 0.4 MG SL tablet Place 0.4 mg under the tongue every 5 (five) minutes as needed for chest pain.  . rosuvastatin (CRESTOR) 40 MG tablet Take one tablet by mouth once daily for cholesterol  . tizanidine (ZANAFLEX) 2 MG capsule Take 1 capsule (2 mg total) by mouth 3 (three) times daily.   No facility-administered  encounter medications on file as  of 12/26/2017.     Review of Systems  Constitutional: Negative for chills, fever and malaise/fatigue.  HENT: Negative.   Respiratory: Positive for shortness of breath. Negative for cough and wheezing.   Cardiovascular: Positive for chest pain and palpitations. Negative for orthopnea and leg swelling.  Gastrointestinal: Negative.   Genitourinary: Positive for frequency and urgency.  Musculoskeletal: Positive for joint pain and myalgias.       OA-mulitple joints  Neurological: Negative.   Endo/Heme/Allergies: Negative.   Psychiatric/Behavioral: Positive for memory loss. The patient has insomnia.     Vitals:   12/26/17 1408  BP: (!) 172/80  Pulse: 72  Temp: 98.1 F (36.7 C)  TempSrc: Oral  SpO2: 95%  Weight: 162 lb (73.5 kg)   Body mass index is 29.63 kg/m. Physical Exam  Constitutional: She is oriented to person, place, and time. She appears well-developed and well-nourished.  Cardiovascular: Normal heart sounds and intact distal pulses. An irregularly irregular rhythm present.  Pulmonary/Chest: Effort normal and breath sounds normal.  Musculoskeletal: Normal range of motion.  Neurological: She is alert and oriented to person, place, and time.  Skin: Skin is warm and dry.  Psychiatric: She has a normal mood and affect. Her behavior is normal. Judgment and thought content normal.  Vitals reviewed.   Labs reviewed: Basic Metabolic Panel: Recent Labs    04/15/17 0906 12/24/17 0824  NA 138 135  K 4.7 4.7  CL 102 100  CO2 27 28  GLUCOSE 91 108*  BUN 23 28*  CREATININE 1.38* 1.54*  CALCIUM 9.1 9.2   Liver Function Tests: Recent Labs    04/15/17 0906 12/24/17 0824  AST 14 16  ALT 6 7  ALKPHOS 38  --   BILITOT 0.6 0.5  PROT 6.7 6.5  ALBUMIN 3.6  --    No results for input(s): LIPASE, AMYLASE in the last 8760 hours. No results for input(s): AMMONIA in the last 8760 hours. CBC: Recent Labs    12/24/17 0824  WBC 5.2  NEUTROABS 3,120  HGB 10.4*  HCT 32.8*    MCV 78.5*  PLT 184   Cardiac Enzymes: No results for input(s): CKTOTAL, CKMB, CKMBINDEX, TROPONINI in the last 8760 hours. BNP: Invalid input(s): POCBNP Lab Results  Component Value Date   HGBA1C 6.4 (H) 12/24/2017   Lab Results  Component Value Date   TSH 1.436 11/24/2013   No results found for: VITAMINB12 No results found for: FOLATE No results found for: IRON, TIBC, FERRITIN  Imaging and Procedures Recently:  Assessment/Plan  1. Paroxysmal atrial fibrillation (HCC) She reports feeling the irregular beating of her heart rate. She is having some increase SOB with this. On exam she is stable with no signs of respiratory distress or signs of RVR. She continues to take coreg 25 mg and is followed by cardiology for his as well. She was started on Eliquis to help prevent clot formation. She is tolerating this well she says. Will continue this treatment and appreciate cardiologist collaboration with her.    2. Essential hypertension Today in office her first BP is 172/80, her repeat BP take by myself was 136/78. She reports have BP elevated over 150 on several occasions during the week and was previously told to give herself clonidine if SBP was greater than 150. She is having to do this more frequently and so we will add hydralazine 25mg  to her regimen in hopes to improve the stablity to her BP on a daily  bases.  Advised to check her BP and follow back up with [place] to help make sure this medication is tolerated and therapeutic.   - hydrALAZINE (APRESOLINE) 25 MG tablet; Take 1 tablet (25 mg total) by mouth 3 (three) times daily.  Dispense: 90 tablet; Refill: 3  3. CKD (chronic kidney disease) stage 3, GFR 30-59 ml/min (HCC) She has demonstrated a slow decline over the last several years with GFR. She is currently at 37 post this weeks labs. Will continue to monitor this for medication changes as needed. She is also followed by [facility] (Dr Hollie Salk) who we appreciate collaboration  with in her care. Today we will check her iron panel, ferritin, B12, and folate as she demonstrates mild anemia on recent lab work.   4. Type 2 diabetes mellitus with stage 3 chronic kidney disease, without long-term current use of insulin ([place]) She is doing well on blood sugar monitor and tolerates metformin as ordered. She has good control with A1c at 6.4% this lab check. She has carried this trend for over a year. Ordered her more strips today at visit. Will continue her treatment  with metformin.   5. Primary osteoarthritis involving multiple joints She continues to have trouble with her joints. She continues to take extra strength tylenol as needed and will utilize warm baths and soaks to ease the discomfort in hands when it is really bad. She is pleasant and understands there is not much else to be done for this. Will continue to monitor her joints involved and or worsen symptoms. Encouraged to walk and move as much as possible to help lubricate the joint with the fluid in there.   6. Anemia She demonstrates anemia on labs this week. It looks to be iron related. We will check a iron panel, ferritin, B12 and folate. Will order treatments as labs demonstrate which anemia this is. She is encourage to start consuming iron foods such as green leafy veggies, raisins, chicken, eggs, and beef (but instructed to not eat a lot of beef if she can get a good amount of iron from other healthier sources).    Labs/tests ordered: No orders of the defined types were placed in this encounter.   Next Visit: 01/06/2018   Karen Kays, RN, DNP Student Geriatrics [facility] [phone] [place], [place] [address] Cell Phone (Mon-Fri 8am-5pm):  [phone] On Call:  [phone] & follow prompts after 5pm & weekends Office Phone:  [phone] Office Fax:  [phone]

## 2017-12-27 ENCOUNTER — Encounter: Payer: Self-pay | Admitting: *Deleted

## 2018-01-06 ENCOUNTER — Ambulatory Visit: Payer: Medicare Other | Admitting: Internal Medicine

## 2018-01-06 ENCOUNTER — Telehealth: Payer: Self-pay | Admitting: Internal Medicine

## 2018-01-06 NOTE — Telephone Encounter (Signed)
Kendra Todd Self  [phone]  Kendra Todd came in at 2:45 for a 2:15 appointment so we reschedule and she left BP log  The first 2 days on new medicine it made her dizzy and weak. She stated that it was 12/30/17 that she restarted her new medication as prescribed. Sat: 2/23 am before pill 195/76 took Clondine 114/63 Sun: Before meds 186/63 Sun PM 138/63 Mon:  AM 150/64  PM 158/65  PM 147/71 Tues:  AM 139/69  PM 206/86 > took Clondine Wed:  AM 162/77  Took new med 3 times Thurs: AM 95/54  PM 171/80  PM 191/80 - 149/62 Fri: 01/03/18 -- 151/68 01/04/18 AM 138/68  PM 111/65 01/05/18 AM 162/74  PM 144/67 01/06/18 10:00 AM 173/74  She does not feel like new medication is working and would like for you to call her

## 2018-01-06 NOTE — Telephone Encounter (Signed)
Spoke with patient and advised results   

## 2018-01-06 NOTE — Telephone Encounter (Signed)
Agree with hydralazine was not working and I don't want her to take it if she is feeling dizzy and weak.  She should keep new appointment to readdress bp meds

## 2018-01-19 ENCOUNTER — Other Ambulatory Visit: Payer: Self-pay | Admitting: Cardiovascular Disease

## 2018-01-20 ENCOUNTER — Encounter: Payer: Self-pay | Admitting: Internal Medicine

## 2018-01-20 DIAGNOSIS — D649 Anemia, unspecified: Secondary | ICD-10-CM | POA: Insufficient documentation

## 2018-01-20 NOTE — Telephone Encounter (Signed)
REFILL 

## 2018-01-23 ENCOUNTER — Encounter: Payer: Self-pay | Admitting: Internal Medicine

## 2018-01-23 ENCOUNTER — Ambulatory Visit (INDEPENDENT_AMBULATORY_CARE_PROVIDER_SITE_OTHER): Payer: Medicare Other | Admitting: Internal Medicine

## 2018-01-23 VITALS — BP 132/60 | HR 63 | Temp 98.2°F | Ht 62.0 in | Wt 163.2 lb

## 2018-01-23 DIAGNOSIS — N183 Chronic kidney disease, stage 3 unspecified: Secondary | ICD-10-CM

## 2018-01-23 DIAGNOSIS — I2510 Atherosclerotic heart disease of native coronary artery without angina pectoris: Secondary | ICD-10-CM | POA: Diagnosis not present

## 2018-01-23 DIAGNOSIS — E1122 Type 2 diabetes mellitus with diabetic chronic kidney disease: Secondary | ICD-10-CM

## 2018-01-23 DIAGNOSIS — I480 Paroxysmal atrial fibrillation: Secondary | ICD-10-CM

## 2018-01-23 DIAGNOSIS — I10 Essential (primary) hypertension: Secondary | ICD-10-CM

## 2018-01-23 NOTE — Progress Notes (Signed)
Location:  [facility] clinic Provider:  Tiffany L. Mariea Clonts, D.O., C.M.D.  Code Status: FC Goals of Care:  Advanced Directives 12/26/2017  Does Patient Have a Medical Advance Directive? Yes  Type of Paramedic of [place];Living will  Does patient want to make changes to medical advance directive? No - Patient declined  Copy of [place] in Chart? No - copy requested  Would patient like information on creating a medical advance directive? -  Pre-existing out of facility DNR order (yellow form or pink MOST form) -     Chief Complaint  Patient presents with  . Follow-up    2 week F/U on B/P medication, DM foot exam due   . Medication Refill    No refills  . Immunizations    Refused flu vaccine   . Health Maintenance    Per patient last eye exam 09/2017     HPI: Patient is a 77 y.o. female seen today for medical management of chronic diseases.  She is doing well and has no complaints today in the office. Her BP is good and reports doing well. She did not bring in a dairy of numbers. But she says it is has not been taking her clonidine as she was because BP has been staying under 150.  She is due today for foot exam as well. She reports no changes in sensation with her feet. She is without trouble walking. She is without burning, tingling, or other changes that cause her trouble.   Eye care: She sees Dr, Venetia Maxon for her eyes. She is already set with him and reports no changes in vision today.     Past Medical History:  Diagnosis Date  . Abdominal pain, epigastric   . Acute upper respiratory infections of unspecified site   . Arthritis   . Atrial fibrillation ([place]) 02/21/2011  . Benign paroxysmal positional vertigo   . CAD (coronary artery disease)    prior coronary stenting  . Candidiasis of vulva and vagina   . Carotid artery disease ([place]) 2001   s/p Bilateral CEA, after CVA  . Cervicalgia   . Decreased pedal pulses 06/04/2011   doppler  - bilateral ABIs no evidence of insufficiency; L CIA slightly elevated velocities 20-30% diameter reduction;   . Dyspnea 03/13/2006   Myoview - EF 76%; mild ischemia in apical lateral region, less severe than previous study in 2002  . Edema   . H/O carotid endarterectomy 03/06/2012   CT angio -high grade stenosis of proximal R internal carotid w/ lg posterior ulceration or dissection flap; near occlusive stenosis at origin of nondominantproximal L vertebral artery; <50% stenosis of proximal R vertebral and proximal L subclavian artery  . H/O endarterectomy 09/11/2012   patent L and R carotid sites, R 60-79% restenosed ICA; L <40% restenosed ICA  . H/O endarterectomy 01/08/2012   doppler - R distal common carotid/proximal ICA stenosed 80-99%, L 0-39%  . HTN (hypertension) 11/27/2012  . Hyperlipidemia   . Hypertension   . Limb pain 05/02/2011   doppler - no evidence of thrombus of thrombophlebitis  . Long term (current) use of anticoagulants   . Loss of weight   . Lumbago   . Myalgia and myositis, unspecified   . Myocardial infarction ([place])   . Nonspecific abnormal results of liver function study   . Nontoxic uninodular goiter   . Osteoarthrosis, unspecified whether generalized or localized, unspecified site   . Other malaise and fatigue   .  Other manifestations of vitamin A deficiency   . Pain in joint, pelvic region and thigh   . Pain in limb   . Peripheral vascular disease ([place])   . Phlebitis and thrombophlebitis of superficial vessels of lower extremities   . Primary localized osteoarthrosis, hand   . Pulmonary HTN ([place]) 12/03/2012  . Renal artery stenosis ([place])   . Routine gynecological examination   . Spinal stenosis, unspecified region other than cervical   . Stenosis of artery ([place]) 06/03/2012   doppler- most distal aspect of abd aorta no aneurysmal dilation; bilateral ABIs - mild L arterial insufficiency, L CIA narrowing w/ 50-69% diameter reduction; L and R SFA both w/ 0-49% diameter  reductions  . Stroke ([place]) 2001  . Superficial vein thrombosis 05/11/2015   Left forehead  . Tobacco use disorder   . TR (tricuspid regurgitation), severe 12/02/12 for repair with CABG 12/03/2012  . Type II or unspecified type diabetes mellitus with peripheral circulatory disorders, not stated as uncontrolled(250.70)   . Unspecified cataract   . Unspecified disorder of iris and ciliary body   . Unspecified vitamin D deficiency   . Urinary tract infection, site not specified     Past Surgical History:  Procedure Laterality Date  . ABDOMINAL HYSTERECTOMY  1975  . ANGIOPLASTY  1984 and 1982  . APPENDECTOMY  1954  . ARCH AORTOGRAM  03/30/2011   40% recurrent stenosis at origin of R proximal carotid patch, shelf like recurrent stenosis w/in midsection of patch that does not appear to be flow limiting; normal non-stenosed great vessel origins  . BACK SURGERY  '84,'86, '87   three previous surgeries  . CAROTID ENDARTERECTOMY  2001   Bilateral  . CORONARY ARTERY BYPASS GRAFT  12/04/2012   Procedure: CORONARY ARTERY BYPASS GRAFTING (CABG);  Surgeon: Ivin Poot, MD; LIMA-LAD, SVG-OM, SVG-RCA  . INTRAOPERATIVE TRANSESOPHAGEAL ECHOCARDIOGRAM  12/04/2012   Procedure: INTRAOPERATIVE TRANSESOPHAGEAL ECHOCARDIOGRAM;  Surgeon: Ivin Poot, MD;  Location: [facility];  Service: Open Heart Surgery;  Laterality: N/A;  . KIDNEY STONE SURGERY     Removal  . LEFT HEART CATHETERIZATION WITH CORONARY ANGIOGRAM N/A 11/28/2012   Procedure: LEFT HEART CATHETERIZATION WITH CORONARY ANGIOGRAM;  Surgeon: Leonie Man, MD;  Location[facility]ATH LAB;  Service: Cardiovascular;  Laterality: N/A;  . PERCUTANEOUS CORONARY STENT INTERVENTION (PCI-S)  2002   2 BMS in ostial/Prox & mid RCA (mid 2.5 mm 20x  mm, & 2.75 mm x  8 mm ostial)  . RENAL ANGIOGRAM N/A 11/30/2013   Procedure: RENAL ANGIOGRAM;  Surgeon: Lorretta Harp, MD;  Location: [facility] CATH LAB;  Service: Cardiovascular;  Laterality: N/A;  . RIGHT HEART  CATHETERIZATION N/A 12/02/2012   Procedure: RIGHT HEART CATH;  Surgeon: Troy Sine, MD;  Location[facility] C[facility] LAB;  Service: Cardiovascular;  Laterality: N/A;  . TEE WITH CARDIOVERSION  01/23/2011   EF 82-95%; grade 2 diastolic dysfunction, elevated mean LA filling pressure, RV systolic pressure increased consistent w/ mod pulmonary hypertension    Allergies  Allergen Reactions  . Iron Hives  . Vicodin [Hydrocodone-Acetaminophen] Other (See Comments)    Caused arrythmia   . Amiodarone Nausea And Vomiting  . Norvasc [Amlodipine Besylate] Nausea And Vomiting  . Promethazine Other (See Comments)    hallucinations  . Vioxx [Rofecoxib] Nausea And Vomiting    Outpatient Encounter Medications as of 01/23/2018  Medication Sig  . alendronate (FOSAMAX) 70 MG tablet Take 70 mg by mouth once a week.   [name] apixaban (ELIQUIS)  5 MG TABS tablet Take 1 tablet (5 mg total) by mouth 2 (two) times daily.  [name] aspirin 81 MG tablet Take 81 mg by mouth daily.   . Blood Glucose Monitoring Suppl (ONE TOUCH ULTRA MINI) w/Device KIT Use as instructed to test blood sugar once daily DX: E11.29  . carvedilol (COREG) 25 MG tablet Take 0.5 tablets (12.5 mg total) by mouth 2 (two) times daily with a meal. NEED OV.  [name] glucose blood (ONE TOUCH ULTRA TEST) test strip Use as instructed to test blood sugar once daily DX: E11.29  . hydrALAZINE (APRESOLINE) 25 MG tablet Take 1 tablet (25 mg total) by mouth 3 (three) times daily.  [name] LUMIGAN 0.01 % SOLN Place 1 drop into both eyes at bedtime.   . metFORMIN (GLUCOPHAGE) 1000 MG tablet TAKE 1 TABLET TWICE A DAY WITH MEALS  . nitroGLYCERIN (NITROSTAT) 0.4 MG SL tablet Place 0.4 mg under the tongue every 5 (five) minutes as needed for chest pain.  . ONE TOUCH LANCETS MISC Use as instructed to test blood sugar once daily DX: E11.29  . rosuvastatin (CRESTOR) 40 MG tablet Take one tablet by mouth once daily for cholesterol  . [DISCONTINUED] tizanidine (ZANAFLEX) 2 MG capsule Take 1  capsule (2 mg total) by mouth 3 (three) times daily. (Patient not taking: Reported on 01/23/2018)   No facility-administered encounter medications on file as of 01/23/2018.     Review of Systems:  Review of Systems  Constitutional: Negative for chills, fever and malaise/fatigue.  Eyes: Negative for blurred vision.  Respiratory: Negative for cough and shortness of breath.   Cardiovascular: Negative for chest pain, palpitations and leg swelling.  Neurological: Negative for dizziness and headaches.  Psychiatric/Behavioral: The patient does not have insomnia.     Health Maintenance  Topic Date Due  . FOOT EXAM  01/12/2016  . OPHTHALMOLOGY EXAM  09/18/2017  . URINE MICROALBUMIN  04/15/2018  . HEMOGLOBIN A1C  06/23/2018  . TETANUS/TDAP  11/13/2021  . DEXA SCAN  Completed  . PNA vac Low Risk Adult  Completed  . INFLUENZA VACCINE  Discontinued    Physical Exam: Vitals:   01/23/18 1317  BP: 132/60  Pulse: 63  Temp: 98.2 F (36.8 C)  TempSrc: Oral  SpO2: 97%  Weight: 163 lb 3.2 oz (74 kg)  Height: '5\' 2"'$  (1.575 m)   Body mass index is 29.85 kg/m. Physical Exam  Constitutional: She is oriented to person, place, and time. She appears well-developed.  Cardiovascular: Intact distal pulses. An irregularly irregular rhythm present.  Pulses:      Dorsalis pedis pulses are 2+ on the right side, and 2+ on the left side.       Posterior tibial pulses are 1+ on the right side, and 1+ on the left side.  Controlled A-fib today  She has bilateral non pitting edema of the top foot.   Pulmonary/Chest: Effort normal and breath sounds normal.  Musculoskeletal:       Right foot: There is normal range of motion and no deformity.       Left foot: There is normal range of motion and no deformity.  Feet:  Right Foot:  Protective Sensation: 5 sites tested. 5 sites sensed.  Left Foot:  Protective Sensation: 5 sites tested. 5 sites sensed.  Neurological: She is alert and oriented to person,  place, and time.  Skin: Skin is warm and dry. Capillary refill takes less than 2 seconds.  Psychiatric: She has a normal mood and affect.  Her behavior is normal. Judgment and thought content normal.    Labs reviewed: Basic Metabolic Panel: Recent Labs    04/15/17 0906 12/24/17 0824  NA 138 135  K 4.7 4.7  CL 102 100  CO2 27 28  GLUCOSE 91 108*  BUN 23 28*  CREATININE 1.38* 1.54*  CALCIUM 9.1 9.2   Liver Function Tests: Recent Labs    04/15/17 0906 12/24/17 0824  AST 14 16  ALT 6 7  ALKPHOS 38  --   BILITOT 0.6 0.5  PROT 6.7 6.5  ALBUMIN 3.6  --    No results for input(s): LIPASE, AMYLASE in the last 8760 hours. No results for input(s): AMMONIA in the last 8760 hours. CBC: Recent Labs    12/24/17 0824  WBC 5.2  NEUTROABS 3,120  HGB 10.4*  HCT 32.8*  MCV 78.5*  PLT 184   Lipid Panel: Recent Labs    04/15/17 0906 12/24/17 0824  CHOL 164 171  HDL 55 51  LDLCALC 95 103*  TRIG 68 81  CHOLHDL 3.0 3.4   Lab Results  Component Value Date   HGBA1C 6.4 (H) 12/24/2017    Procedures since last visit: No results found.  Assessment/Plan 1. Paroxysmal atrial fibrillation ([place]) She is in afib today, but controlled and without signs and symptoms of  Clot formation or worsen of control. She does have mild edema of the top Foot, but she reports that is her usual. She is maintained on Eliquis and coreg.   - CBC with Differential/Platelet; Future  2. Essential hypertension She is stable today and reports tolerating the hydralazine as prescribed and  Tolerating it without trouble. Will continue this as well as her Clonidine as needed fro SBP over 150.  - COMPLETE METABOLIC PANEL WITH GFR; Future  3. Type 2 diabetes mellitus with stage 3 chronic kidney disease, without long-term current use of insulin ([place]) She is doing well. Will assess A1c at future date. She continues metformin.  She had her annual foot exam today and she is without signs of peripheral    Neuropathy at this time. - Hemoglobin A1c; Future  4. Coronary artery disease involving native coronary artery of native heart without angina pectoris She is stable with this and without chest pain. She is maintained on asa, statin, coreg, hydralazine, and PRN nitro. Will assess with labs in future.  - CBC with Differential/Platelet; Future - COMPLETE METABOLIC PANEL WITH GFR; Future - Lipid panel; Future  Labs/tests ordered:   Orders Placed This Encounter  Procedures  . CBC with Differential/Platelet    Standing Status:   Future    Standing Expiration Date:   09/25/2018  . COMPLETE METABOLIC PANEL WITH GFR    Standing Status:   Future    Standing Expiration Date:   09/25/2018  . Hemoglobin A1c    Standing Status:   Future    Standing Expiration Date:   09/25/2018  . Lipid panel    Standing Status:   Future    Standing Expiration Date:   09/25/2018    Next appt:  06/05/2018   Karen Kays, RN, AGPCNP, DNP [facility] [phone], [place] [address] Cell Phone (Mon-Fri 8am-5pm):  [phone] On Call:  [phone] & follow prompts after 5pm & weekends Office Phone:  [phone] Office Fax:  [phone]

## 2018-01-31 ENCOUNTER — Telehealth: Payer: Self-pay | Admitting: Physician Assistant

## 2018-01-31 MED ORDER — NITROGLYCERIN 0.4 MG SL SUBL: 0.4000 mg | SUBLINGUAL_TABLET | SUBLINGUAL | 3 refills | Status: AC | PRN

## 2018-01-31 NOTE — Telephone Encounter (Signed)
Spoke with patient and she has been having constant soreness in her left rib area and under left breast. Radiates to back between shoulder blades, Tylenol seems to help. Pain worse when bends over. Denies any shortness of breath or nausea. Pain has been going on for less than a week. Blood pressure 132/60 NTG is old so asked for refill, rx sent to pharmacy. Patient wanted to be seen since she had not been seen in a year, she is overdue.  Appointment had been scheduled prior to me calling patient. Advised to continue to use Tylenol as needed and keep appointment next week.

## 2018-01-31 NOTE — Telephone Encounter (Signed)
Pt c/o of Chest Pain: STAT if CP now or developed within 24 hours  1. Are you having CP right now? No   2. Are you experiencing any other symptoms (ex. SOB, nausea, vomiting, sweating)? No other symptoms reported   3. How long have you been experiencing CP? a few days   4. Is your CP continuous or coming and going? Patient states that is chest discomfort, not sharp pains but uncomfortable and pain in shoulder blades.  5. Have you taken Nitroglycerin?  ?

## 2018-02-02 ENCOUNTER — Encounter (HOSPITAL_COMMUNITY): Payer: Self-pay | Admitting: Emergency Medicine

## 2018-02-02 ENCOUNTER — Emergency Department (HOSPITAL_COMMUNITY): Payer: Medicare Other

## 2018-02-02 ENCOUNTER — Inpatient Hospital Stay (HOSPITAL_COMMUNITY)
Admission: EM | Admit: 2018-02-02 | Discharge: 2018-02-05 | DRG: 305 | Disposition: A | Discharge status: Home / Self Care | Payer: Medicare Other | Attending: Cardiovascular Disease | Admitting: Cardiovascular Disease

## 2018-02-02 ENCOUNTER — Other Ambulatory Visit: Payer: Self-pay

## 2018-02-02 DIAGNOSIS — I130 Hypertensive heart and chronic kidney disease with heart failure and stage 1 through stage 4 chronic kidney disease, or unspecified chronic kidney disease: Secondary | ICD-10-CM | POA: Diagnosis present

## 2018-02-02 DIAGNOSIS — Z90710 Acquired absence of both cervix and uterus: Secondary | ICD-10-CM

## 2018-02-02 DIAGNOSIS — Z8679 Personal history of other diseases of the circulatory system: Secondary | ICD-10-CM

## 2018-02-02 DIAGNOSIS — Z951 Presence of aortocoronary bypass graft: Secondary | ICD-10-CM

## 2018-02-02 DIAGNOSIS — Z8673 Personal history of transient ischemic attack (TIA), and cerebral infarction without residual deficits: Secondary | ICD-10-CM

## 2018-02-02 DIAGNOSIS — I2721 Secondary pulmonary arterial hypertension: Secondary | ICD-10-CM | POA: Diagnosis not present

## 2018-02-02 DIAGNOSIS — E7800 Pure hypercholesterolemia, unspecified: Secondary | ICD-10-CM | POA: Diagnosis present

## 2018-02-02 DIAGNOSIS — I716 Thoracoabdominal aortic aneurysm, without rupture, unspecified: Secondary | ICD-10-CM | POA: Diagnosis present

## 2018-02-02 DIAGNOSIS — R110 Nausea: Secondary | ICD-10-CM | POA: Diagnosis not present

## 2018-02-02 DIAGNOSIS — R079 Chest pain, unspecified: Secondary | ICD-10-CM | POA: Diagnosis not present

## 2018-02-02 DIAGNOSIS — Z7984 Long term (current) use of oral hypoglycemic drugs: Secondary | ICD-10-CM

## 2018-02-02 DIAGNOSIS — Z823 Family history of stroke: Secondary | ICD-10-CM

## 2018-02-02 DIAGNOSIS — Z7901 Long term (current) use of anticoagulants: Secondary | ICD-10-CM

## 2018-02-02 DIAGNOSIS — I252 Old myocardial infarction: Secondary | ICD-10-CM

## 2018-02-02 DIAGNOSIS — Z888 Allergy status to other drugs, medicaments and biological substances status: Secondary | ICD-10-CM

## 2018-02-02 DIAGNOSIS — Z955 Presence of coronary angioplasty implant and graft: Secondary | ICD-10-CM

## 2018-02-02 DIAGNOSIS — Z8249 Family history of ischemic heart disease and other diseases of the circulatory system: Secondary | ICD-10-CM

## 2018-02-02 DIAGNOSIS — I160 Hypertensive urgency: Principal | ICD-10-CM | POA: Diagnosis present

## 2018-02-02 DIAGNOSIS — Z803 Family history of malignant neoplasm of breast: Secondary | ICD-10-CM

## 2018-02-02 DIAGNOSIS — I491 Atrial premature depolarization: Secondary | ICD-10-CM | POA: Diagnosis present

## 2018-02-02 DIAGNOSIS — Z886 Allergy status to analgesic agent status: Secondary | ICD-10-CM

## 2018-02-02 DIAGNOSIS — I5032 Chronic diastolic (congestive) heart failure: Secondary | ICD-10-CM | POA: Diagnosis present

## 2018-02-02 DIAGNOSIS — Z79899 Other long term (current) drug therapy: Secondary | ICD-10-CM

## 2018-02-02 DIAGNOSIS — Z87442 Personal history of urinary calculi: Secondary | ICD-10-CM

## 2018-02-02 DIAGNOSIS — D500 Iron deficiency anemia secondary to blood loss (chronic): Secondary | ICD-10-CM | POA: Diagnosis present

## 2018-02-02 DIAGNOSIS — E785 Hyperlipidemia, unspecified: Secondary | ICD-10-CM | POA: Diagnosis present

## 2018-02-02 DIAGNOSIS — E1151 Type 2 diabetes mellitus with diabetic peripheral angiopathy without gangrene: Secondary | ICD-10-CM | POA: Diagnosis present

## 2018-02-02 DIAGNOSIS — I272 Pulmonary hypertension, unspecified: Secondary | ICD-10-CM | POA: Diagnosis present

## 2018-02-02 DIAGNOSIS — I513 Intracardiac thrombosis, not elsewhere classified: Secondary | ICD-10-CM | POA: Diagnosis present

## 2018-02-02 DIAGNOSIS — Z8672 Personal history of thrombophlebitis: Secondary | ICD-10-CM

## 2018-02-02 DIAGNOSIS — Z833 Family history of diabetes mellitus: Secondary | ICD-10-CM

## 2018-02-02 DIAGNOSIS — Z7982 Long term (current) use of aspirin: Secondary | ICD-10-CM

## 2018-02-02 DIAGNOSIS — I4891 Unspecified atrial fibrillation: Secondary | ICD-10-CM | POA: Diagnosis present

## 2018-02-02 DIAGNOSIS — Z87440 Personal history of urinary (tract) infections: Secondary | ICD-10-CM

## 2018-02-02 DIAGNOSIS — I480 Paroxysmal atrial fibrillation: Secondary | ICD-10-CM | POA: Diagnosis present

## 2018-02-02 DIAGNOSIS — M461 Sacroiliitis, not elsewhere classified: Secondary | ICD-10-CM | POA: Diagnosis present

## 2018-02-02 DIAGNOSIS — I441 Atrioventricular block, second degree: Secondary | ICD-10-CM | POA: Diagnosis present

## 2018-02-02 DIAGNOSIS — Z7983 Long term (current) use of bisphosphonates: Secondary | ICD-10-CM

## 2018-02-02 DIAGNOSIS — M47816 Spondylosis without myelopathy or radiculopathy, lumbar region: Secondary | ICD-10-CM | POA: Diagnosis present

## 2018-02-02 DIAGNOSIS — Z885 Allergy status to narcotic agent status: Secondary | ICD-10-CM

## 2018-02-02 DIAGNOSIS — E1122 Type 2 diabetes mellitus with diabetic chronic kidney disease: Secondary | ICD-10-CM | POA: Diagnosis present

## 2018-02-02 DIAGNOSIS — R0789 Other chest pain: Secondary | ICD-10-CM | POA: Diagnosis not present

## 2018-02-02 DIAGNOSIS — M546 Pain in thoracic spine: Secondary | ICD-10-CM

## 2018-02-02 DIAGNOSIS — N183 Chronic kidney disease, stage 3 (moderate): Secondary | ICD-10-CM | POA: Diagnosis present

## 2018-02-02 DIAGNOSIS — I701 Atherosclerosis of renal artery: Secondary | ICD-10-CM | POA: Diagnosis present

## 2018-02-02 DIAGNOSIS — Z87891 Personal history of nicotine dependence: Secondary | ICD-10-CM

## 2018-02-02 DIAGNOSIS — R0602 Shortness of breath: Secondary | ICD-10-CM | POA: Diagnosis not present

## 2018-02-02 DIAGNOSIS — Z9049 Acquired absence of other specified parts of digestive tract: Secondary | ICD-10-CM

## 2018-02-02 DIAGNOSIS — I2510 Atherosclerotic heart disease of native coronary artery without angina pectoris: Secondary | ICD-10-CM | POA: Diagnosis present

## 2018-02-02 DIAGNOSIS — I6523 Occlusion and stenosis of bilateral carotid arteries: Secondary | ICD-10-CM | POA: Diagnosis present

## 2018-02-02 LAB — CBC
HCT: 34.9 % — ABNORMAL LOW (ref 36.0–46.0)
Hemoglobin: 10.9 g/dL — ABNORMAL LOW (ref 12.0–15.0)
MCH: 24.8 pg — ABNORMAL LOW (ref 26.0–34.0)
MCHC: 31.2 g/dL (ref 30.0–36.0)
MCV: 79.3 fL (ref 78.0–100.0)
PLATELETS: 272 10*3/uL (ref 150–400)
RBC: 4.4 MIL/uL (ref 3.87–5.11)
RDW: 18.6 % — ABNORMAL HIGH (ref 11.5–15.5)
WBC: 5.9 10*3/uL (ref 4.0–10.5)

## 2018-02-02 LAB — BASIC METABOLIC PANEL
Anion gap: 13 (ref 5–15)
BUN: 17 mg/dL (ref 6–20)
CALCIUM: 9.9 mg/dL (ref 8.9–10.3)
CO2: 22 mmol/L (ref 22–32)
CREATININE: 1.46 mg/dL — AB (ref 0.44–1.00)
Chloride: 99 mmol/L — ABNORMAL LOW (ref 101–111)
GFR calc non Af Amer: 33 mL/min — ABNORMAL LOW (ref >60)
GFR, EST AFRICAN AMERICAN: 39 mL/min — AB (ref >60)
Glucose, Bld: 97 mg/dL (ref 65–99)
Potassium: 4.8 mmol/L (ref 3.5–5.1)
SODIUM: 134 mmol/L — AB (ref 135–145)

## 2018-02-02 LAB — I-STAT TROPONIN, ED: TROPONIN I, POC: 0 ng/mL (ref 0.00–0.08)

## 2018-02-02 MED ORDER — FENTANYL CITRATE (PF) 100 MCG/2ML IJ SOLN
50.0000 ug | Freq: Once | INTRAMUSCULAR | Status: AC
  Administered 2018-02-02: 50 ug via INTRAVENOUS
  Filled 2018-02-02: qty 2

## 2018-02-02 MED ORDER — HYDRALAZINE HCL 20 MG/ML IJ SOLN
10.0000 mg | Freq: Once | INTRAMUSCULAR | Status: AC
  Administered 2018-02-02: 10 mg via INTRAVENOUS
  Filled 2018-02-02: qty 1

## 2018-02-02 MED ORDER — HYDRALAZINE HCL 20 MG/ML IJ SOLN
20.0000 mg | Freq: Once | INTRAMUSCULAR | Status: AC
  Administered 2018-02-02: 20 mg via INTRAVENOUS
  Filled 2018-02-02: qty 1

## 2018-02-02 MED ORDER — HYDROMORPHONE HCL 1 MG/ML IJ SOLN
1.0000 mg | Freq: Once | INTRAMUSCULAR | Status: AC
  Administered 2018-02-02: 1 mg via INTRAVENOUS
  Filled 2018-02-02: qty 1

## 2018-02-02 MED ORDER — IOPAMIDOL (ISOVUE-370) INJECTION 76%
INTRAVENOUS | Status: AC
  Administered 2018-02-02: 100 mL
  Filled 2018-02-02: qty 100

## 2018-02-02 MED ORDER — ONDANSETRON HCL 4 MG/2ML IJ SOLN
4.0000 mg | Freq: Once | INTRAMUSCULAR | Status: AC
  Administered 2018-02-02: 4 mg via INTRAVENOUS
  Filled 2018-02-02: qty 2

## 2018-02-02 NOTE — ED Notes (Signed)
Pt SpO2 85% on RA. Denies SOB, likely dozing off. Placed on 2Lpm via [place] for comfort and support.

## 2018-02-02 NOTE — ED Provider Notes (Signed)
[facility] EMERGENCY DEPARTMENT Provider Note   CSN: [id number] Arrival date & time: 02/02/18  1920     History   Chief Complaint Chief Complaint  Patient presents with  . Chest Pain  . Back Pain    HPI Kendra Todd is a 77 y.o. female with a h/o of thoracoabdominal aortic aneurysm, CAD s/p PC in 1993 and 2002 and CABG x3 2014, A fib on Eliquis, chronic diastolic CHF NYHA class 2, PAD, bilateral carotid artery stenosis s/o carotid endarterectomy, DM Type II with renal complications, CKD stage III, and renal artery stenosis who presents to the emergency department with a chief complaint of chest pain and dyspnea.   Patient endorses constant, worsening left sided, sharp, chest pain, located under the left breast, that radiates to her back, between her shoulder blades, that began 3 days ago with worsening dyspnea.  It is worse when she sits with her back against the bed or chair and improved when she leans forward.   She treated her symptoms with Tylenol with mild improvement.  She called her cardiologist's office 2 days ago to get a refill of nitroglycerin and to schedule an appointment since she had not been seen in over a year.  A prescription of nitroglycerin was called in and she has a follow-up appointment with cardiology this upcoming week.  She denies palpitations, leg swelling, cough, fever, chills, URI sx, dizziness, weakness, syncope, nausea, or emesis.  No known trauma or injury.  Reports that she took all of her home pain medications this morning, but has not taken any of her afternoon or nighttime medications.  The history is provided by the patient. No language interpreter was used.    Past Medical History:  Diagnosis Date  . Abdominal pain, epigastric   . Acute upper respiratory infections of unspecified site   . Arthritis   . Atrial fibrillation ([place]) 02/21/2011  . Benign paroxysmal positional vertigo   . CAD (coronary artery disease)    prior  coronary stenting  . Candidiasis of vulva and vagina   . Carotid artery disease ([place]) 2001   s/p Bilateral CEA, after CVA  . Cervicalgia   . Decreased pedal pulses 06/04/2011   doppler - bilateral ABIs no evidence of insufficiency; L CIA slightly elevated velocities 20-30% diameter reduction;   . Dyspnea 03/13/2006   Myoview - EF 76%; mild ischemia in apical lateral region, less severe than previous study in 2002  . Edema   . H/O carotid endarterectomy 03/06/2012   CT angio -high grade stenosis of proximal R internal carotid w/ lg posterior ulceration or dissection flap; near occlusive stenosis at origin of nondominantproximal L vertebral artery; <50% stenosis of proximal R vertebral and proximal L subclavian artery  . H/O endarterectomy 09/11/2012   patent L and R carotid sites, R 60-79% restenosed ICA; L <40% restenosed ICA  . H/O endarterectomy 01/08/2012   doppler - R distal common carotid/proximal ICA stenosed 80-99%, L 0-39%  . HTN (hypertension) 11/27/2012  . Hyperlipidemia   . Hypertension   . Limb pain 05/02/2011   doppler - no evidence of thrombus of thrombophlebitis  . Long term (current) use of anticoagulants   . Loss of weight   . Lumbago   . Myalgia and myositis, unspecified   . Myocardial infarction ([place])   . Nonspecific abnormal results of liver function study   . Nontoxic uninodular goiter   . Osteoarthrosis, unspecified whether generalized or localized, unspecified site   . Other  malaise and fatigue   . Other manifestations of vitamin A deficiency   . Pain in joint, pelvic region and thigh   . Pain in limb   . Peripheral vascular disease ([place])   . Phlebitis and thrombophlebitis of superficial vessels of lower extremities   . Primary localized osteoarthrosis, hand   . Pulmonary HTN ([place]) 12/03/2012  . Renal artery stenosis ([place])   . Routine gynecological examination   . Spinal stenosis, unspecified region other than cervical   . Stenosis of artery ([place]) 06/03/2012    doppler- most distal aspect of abd aorta no aneurysmal dilation; bilateral ABIs - mild L arterial insufficiency, L CIA narrowing w/ 50-69% diameter reduction; L and R SFA both w/ 0-49% diameter reductions  . Stroke ([place]) 2001  . Superficial vein thrombosis 05/11/2015   Left forehead  . Tobacco use disorder   . TR (tricuspid regurgitation), severe 12/02/12 for repair with CABG 12/03/2012  . Type II or unspecified type diabetes mellitus with peripheral circulatory disorders, not stated as uncontrolled(250.70)   . Unspecified cataract   . Unspecified disorder of iris and ciliary body   . Unspecified vitamin D deficiency   . Urinary tract infection, site not specified     Patient Active Problem List   Diagnosis Date Noted  . Anemia 01/20/2018  . Sacroiliitis ([place]) 04/10/2017  . Pruritus 04/10/2017  . Edema 12/12/2016  . Chronic diastolic CHF (congestive heart failure), NYHA class 2 ([place]) 09/25/2016  . Thoracoabdominal aortic aneurysm, without rupture ([place]) 02/27/2016  . CKD (chronic kidney disease) stage 3, GFR 30-59 ml/min ([facility]) 02/27/2016  . Herpes zoster 05/25/2015  . Superficial vein thrombosis 05/11/2015  . Back pain 10/26/2014  . Osteoarthritis 10/26/2014  . AAA (abdominal aortic aneurysm) without rupture ([place]) 07/21/2014  . Pain in joint, shoulder region 01/20/2014  . Renal artery stenosis ([place]) 10/22/2013  . Palpitations 06/03/2013  . Atrial fibrillation ([place]) 04/25/2013  . TR (tricuspid regurgitation), severe 12/02/12 for repair with CABG 12/03/2012  . Pulmonary HTN ([place]) 12/03/2012  . NSTEMI (non-ST elevated myocardial infarction) ([place]) 11/27/2012  . PAD (peripheral artery disease) ([place]) 11/27/2012  . CAD (coronary artery disease): 1993 PTCA to circumflex.  2002 2 BMS stents to ostial, S/P CABG x 3 12/04/12 11/27/2012  . DM (diabetes mellitus), type 2 with renal complications ([place]) 86/76/1950  . HTN (hypertension) 11/27/2012  . HLD (hyperlipidemia) 11/27/2012  . Bilateral  carotid artery stenosis 02/14/2012    Past Surgical History:  Procedure Laterality Date  . ABDOMINAL HYSTERECTOMY  1975  . ANGIOPLASTY  1984 and 1982  . APPENDECTOMY  1954  . ARCH AORTOGRAM  03/30/2011   40% recurrent stenosis at origin of R proximal carotid patch, shelf like recurrent stenosis w/in midsection of patch that does not appear to be flow limiting; normal non-stenosed great vessel origins  . BACK SURGERY  '84,'86, '87   three previous surgeries  . CAROTID ENDARTERECTOMY  2001   Bilateral  . CORONARY ARTERY BYPASS GRAFT  12/04/2012   Procedure: CORONARY ARTERY BYPASS GRAFTING (CABG);  Surgeon: Ivin Poot, MD; LIMA-LAD, SVG-OM, SVG-RCA  . INTRAOPERATIVE TRANSESOPHAGEAL ECHOCARDIOGRAM  12/04/2012   Procedure: INTRAOPERATIVE TRANSESOPHAGEAL ECHOCARDIOGRAM;  Surgeon: Ivin Poot, MD;  Location: [facility];  Service: Open Heart Surgery;  Laterality: N/A;  . KIDNEY STONE SURGERY     Removal  . LEFT HEART CATHETERIZATION WITH CORONARY ANGIOGRAM N/A 11/28/2012   Procedure: LEFT HEART CATHETERIZATION WITH CORONARY ANGIOGRAM;  Surgeon: Leonie Man, MD;  Location: [facility] CATH LAB;  Service: Cardiovascular;  Laterality: N/A;  . PERCUTANEOUS CORONARY STENT INTERVENTION (PCI-S)  2002   2 BMS in ostial/Prox & mid RCA (mid 2.5 mm 20x  mm, & 2.75 mm x  8 mm ostial)  . RENAL ANGIOGRAM N/A 11/30/2013   Procedure: RENAL ANGIOGRAM;  Surgeon: Lorretta Harp, MD;  Location: [facility] CATH LAB;  Service: Cardiovascular;  Laterality: N/A;  . RIGHT HEART CATHETERIZATION N/A 12/02/2012   Procedure: RIGHT HEART CATH;  Surgeon: Troy Sine, MD;  Location[facility] C[facility] LAB;  Service: Cardiovascular;  Laterality: N/A;  . TEE WITH CARDIOVERSION  01/23/2011   EF 32-20%; grade 2 diastolic dysfunction, elevated mean LA filling pressure, RV systolic pressure increased consistent w/ mod pulmonary hypertension     OB History   None      Home Medications    Prior to Admission medications   Medication Sig Start  Date End Date Taking? Authorizing Provider  alendronate (FOSAMAX) 70 MG tablet Take 70 mg by mouth once a week.  07/13/16  Yes [provider]  apixaban (ELIQUIS) 5 MG TABS tablet Take 1 tablet (5 mg total) by mouth 2 (two) times daily. 10/24/17  Yes Croitoru, Mihai, MD  aspirin 81 MG tablet Take 81 mg by mouth daily.    Yes [provider]  carvedilol (COREG) 25 MG tablet Take 0.5 tablets (12.5 mg total) by mouth 2 (two) times daily with a meal. NEED OV. 01/20/18  Yes Croitoru, Mihai, MD  hydrALAZINE (APRESOLINE) 25 MG tablet Take 1 tablet (25 mg total) by mouth 3 (three) times daily. 12/26/17  Yes Reed, Tiffany L, DO  LUMIGAN 0.01 % SOLN Place 1 drop into both eyes at bedtime.  01/24/16  Yes [provider]  metFORMIN (GLUCOPHAGE) 1000 MG tablet TAKE 1 TABLET TWICE A DAY WITH MEALS Patient taking differently: TAKE 1000 mg  TABLET TWICE A DAY WITH MEALS 08/02/16  Yes Estill Dooms, MD  nitroGLYCERIN (NITROSTAT) 0.4 MG SL tablet Place 1 tablet (0.4 mg total) under the tongue every 5 (five) minutes as needed for chest pain. 01/31/18  Yes Croitoru, Mihai, MD  rosuvastatin (CRESTOR) 40 MG tablet Take one tablet by mouth once daily for cholesterol Patient taking differently: Take 40 mg by mouth at bedtime.  10/23/17  Yes Reed, Tiffany L, DO    Family History Family History  Problem Relation Age of Onset  . Other Mother        CVA  . Diabetes Mother   . Heart disease Mother        Heart disease before age 80  . Heart attack Mother   . Heart disease Father        Heart Disease before age 79  . Heart attack Father   . Heart disease Sister        Amputation  . Diabetes Sister   . Heart disease Brother        Heart disease before age 3  . Diabetes Brother   . Stroke Brother   . Stroke Brother   . Heart disease Brother   . Stroke Brother   . Heart disease Brother   . Heart disease Sister   . Heart disease Sister   . Diabetes Daughter   . Cancer Sister   .  Diabetes Other   . Cancer Other        breast  . Heart disease Other        cad  . Other Other  cva    Social History Social History   Tobacco Use  . Smoking status: Former Smoker    Packs/day: 0.30    Years: 57.00    Pack years: 17.10    Types: Cigarettes    Last attempt to quit: 11/19/2012    Years since quitting: 5.2  . Smokeless tobacco: Never Used  Substance Use Topics  . Alcohol use: Yes    Comment: rarely, 1 a month  . Drug use: No     Allergies   Iron; Vicodin [hydrocodone-acetaminophen]; Amiodarone; Norvasc [amlodipine besylate]; Promethazine; and Vioxx [rofecoxib]   Review of Systems Review of Systems  Constitutional: Negative for activity change, chills and fever.  HENT: Negative for congestion.   Respiratory: Positive for shortness of breath. Negative for cough.   Cardiovascular: Positive for chest pain. Negative for palpitations and leg swelling.  Gastrointestinal: Negative for abdominal pain.  Genitourinary: Negative for dysuria.  Musculoskeletal: Negative for back pain.  Skin: Negative for rash.  Allergic/Immunologic: Negative for immunocompromised state.  Neurological: Negative for dizziness, syncope, weakness, numbness and headaches.  Psychiatric/Behavioral: Negative for confusion.     Physical Exam Updated Vital Signs BP (!) 178/73   Pulse 90   Temp 98 F (36.7 C) (Oral)   Resp 20   Ht 5' 2.5" (1.588 m)   Wt 72.1 kg (159 lb)   SpO2 96%   BMI 28.62 kg/m   Physical Exam  Constitutional: She is oriented to person, place, and time. No distress.  Uncomfortable appearing  HENT:  Head: Normocephalic.  Eyes: Conjunctivae are normal.  Neck: Neck supple. No JVD present.  Cardiovascular: Normal rate and normal heart sounds. An irregularly irregular rhythm present. Exam reveals no gallop and no friction rub.  No murmur heard. DP and radial pulses 2+  Pulmonary/Chest: Effort normal and breath sounds normal. No stridor. No respiratory  distress. She has no wheezes. She has no rales. She exhibits no tenderness.  Abdominal: Soft. She exhibits no distension. There is no tenderness.  Musculoskeletal: She exhibits tenderness.  She has somewhat reproducible tenderness to palpation over the left shoulder blade.  Neurological: She is alert and oriented to person, place, and time.  Skin: Skin is warm. No rash noted. She is not diaphoretic.  Psychiatric: Her behavior is normal.  Nursing note and vitals reviewed.    ED Treatments / Results  Labs (all labs ordered are listed, but only abnormal results are displayed) Labs Reviewed  BASIC METABOLIC PANEL - Abnormal; Notable for the following components:      Result Value   Sodium 134 (*)    Chloride 99 (*)    Creatinine, Ser 1.46 (*)    GFR calc non Af Amer 33 (*)    GFR calc Af Amer 39 (*)    All other components within normal limits  CBC - Abnormal; Notable for the following components:   Hemoglobin 10.9 (*)    HCT 34.9 (*)    MCH 24.8 (*)    RDW 18.6 (*)    All other components within normal limits  I-STAT TROPONIN, ED    EKG EKG Interpretation  Date/Time:  Sunday February 02 2018 19:42:24 EDT Ventricular Rate:  96 PR Interval:    QRS Duration: 72 QT Interval:  356 QTC Calculation: 449 R Axis:   22 Text Interpretation:  Atrial fibrillation ST & T wave abnormality, consider inferior ischemia Abnormal ECG Confirmed by Dene Gentry [id number]) on 02/02/2018 8:30:56 PM   Radiology Dg Chest 2 View  Result  Date: 02/02/2018 CLINICAL DATA:  Chest pain. EXAM: CHEST - 2 VIEW COMPARISON:  Radiographs of February 18, 2013. FINDINGS: Status post coronary bypass graft. Increased aneurysmal dilatation of descending thoracic aorta is noted. No pneumothorax or pleural effusion is noted. Both lungs are clear. The visualized skeletal structures are unremarkable. IMPRESSION: Increased aneurysmal dilatation of descending thoracic aorta is noted; CT angiography of the chest is recommended  for further evaluation. No other significant abnormality seen in the chest. Electronically Signed   By: Marijo Conception, M.D.   On: 02/02/2018 20:49    Procedures .Critical Care Performed by: Joanne Gavel, PA-C Authorized by: Joanne Gavel, PA-C   Critical care provider statement:    Critical care time (minutes):  45   Critical care time was exclusive of:  Separately billable procedures and treating other patients and teaching time   Critical care was necessary to treat or prevent imminent or life-threatening deterioration of the following conditions:  Cardiac failure   Critical care was time spent personally by me on the following activities:  Re-evaluation of patient's condition, review of old charts, ordering and performing treatments and interventions, ordering and review of laboratory studies, ordering and review of radiographic studies, examination of patient, evaluation of patient's response to treatment, development of treatment plan with patient or surrogate and obtaining history from patient or surrogate   (including critical care time)  Medications Ordered in ED Medications  hydrALAZINE (APRESOLINE) injection 10 mg (10 mg Intravenous Given 02/02/18 2123)  fentaNYL (SUBLIMAZE) injection 50 mcg (50 mcg Intravenous Given 02/02/18 2121)  iopamidol (ISOVUE-370) 76 % injection (100 mLs  Contrast Given 02/02/18 2229)  hydrALAZINE (APRESOLINE) injection 20 mg (20 mg Intravenous Given 02/02/18 2156)  HYDROmorphone (DILAUDID) injection 1 mg (1 mg Intravenous Given 02/02/18 2202)  ondansetron (ZOFRAN) injection 4 mg (4 mg Intravenous Given 02/02/18 2208)     Initial Impression / Assessment and Plan / ED Course  I have reviewed the triage vital signs and the nursing notes.  Pertinent labs & imaging results that were available during my care of the patient were reviewed by me and considered in my medical decision making (see chart for details).     77 year old female with a h/o of  thoracoabdominal aortic aneurysm, CAD s/p PC in 1993 and 2002 and CABG x3 2014, A fib on Eliquis, chronic diastolic CHF NYHA class 2, PAD, bilateral carotid artery stenosis s/o carotid endarterectomy, DM Type II with renal complications, CKD stage III, and renal artery stenosis who presents to the emergency department with a chief complaint of chest pain and dyspnea.  The patient was seen and evaluated by Dr. Francia Greaves, attending physician, who is in agreement with the workup and plan.  BP 240/100s on initial evaluation.  On exam, she appears extremely uncomfortable. Her vital signs are extremely concerning for clinical decompensation. Given her hypertensive emergency and known thoracic aneurysm, there is significant concern for aortic dissection.   She missed her afternoon and evening dose of her home hydralazine.  10 of IV hydralazine given with no improvement in her blood pressure.  Repeated with 20 mg of hydralazine, improving to 141/55.  Pain controlled with fentanyl and Dilaudid.  Chest x-ray with increased aneurysmal dilatation of descending thoracic aorta.  No obvious mediastinal widening.  CT dissection study is pending. EKG with A. fib, unchanged from previous.  Troponin is negative.  Labs are otherwise reassuring. Patient care transferred to PA Sanders at the end of my shift. Patient presentation, ED course, and  plan of care discussed with review of all pertinent labs and imaging. Please see his/her note for further details regarding further ED course and disposition.   Final Clinical Impressions(s) / ED Diagnoses   Final diagnoses:  None    ED Discharge Orders    None       Joanne Gavel, PA-C 02/02/18 2350    Valarie Merino, MD 02/03/18 [phone]

## 2018-02-02 NOTE — ED Notes (Signed)
Patient transported to CT 

## 2018-02-02 NOTE — ED Notes (Signed)
Pt reports vomiting once while in CT but upon returning to tx room pt states her nausea is subsiding

## 2018-02-02 NOTE — ED Notes (Signed)
Pt coming to treatment room after chest xray

## 2018-02-02 NOTE — ED Provider Notes (Signed)
Assumed care from PA MacDonald at shift change.  See prior notes for full H&P.   Briefly, 77 y.o. F-- known TAA, here with HTN emergency abnd chest pain beneath left breast to the back between shoulder blades.  Missed evening dose of hydralazine so given some here but did require further doses.  Last BP 191/61.  Labs thus far reassuring.  CXR with increased aneurysmal dilatation of descend aorta noted.    Plan:  Dissection study pending.  Will need admission for BP control even if CT negative.  Results for orders placed or performed during the hospital encounter of 81/19/14  Basic metabolic panel  Result Value Ref Range   Sodium 134 (L) 135 - 145 mmol/L   Potassium 4.8 3.5 - 5.1 mmol/L   Chloride 99 (L) 101 - 111 mmol/L   CO2 22 22 - 32 mmol/L   Glucose, Bld 97 65 - 99 mg/dL   BUN 17 6 - 20 mg/dL   Creatinine, Ser 1.46 (H) 0.44 - 1.00 mg/dL   Calcium 9.9 8.9 - 10.3 mg/dL   GFR calc non Af Amer 33 (L) >60 mL/min   GFR calc Af Amer 39 (L) >60 mL/min   Anion gap 13 5 - 15  CBC  Result Value Ref Range   WBC 5.9 4.0 - 10.5 K/uL   RBC 4.40 3.87 - 5.11 MIL/uL   Hemoglobin 10.9 (L) 12.0 - 15.0 g/dL   HCT 34.9 (L) 36.0 - 46.0 %   MCV 79.3 78.0 - 100.0 fL   MCH 24.8 (L) 26.0 - 34.0 pg   MCHC 31.2 30.0 - 36.0 g/dL   RDW 18.6 (H) 11.5 - 15.5 %   Platelets 272 150 - 400 K/uL  I-stat troponin, ED  Result Value Ref Range   Troponin i, poc 0.00 0.00 - 0.08 ng/mL   Comment 3           Dg Chest 2 View  Result Date: 02/02/2018 CLINICAL DATA:  Chest pain. EXAM: CHEST - 2 VIEW COMPARISON:  Radiographs of February 18, 2013. FINDINGS: Status post coronary bypass graft. Increased aneurysmal dilatation of descending thoracic aorta is noted. No pneumothorax or pleural effusion is noted. Both lungs are clear. The visualized skeletal structures are unremarkable. IMPRESSION: Increased aneurysmal dilatation of descending thoracic aorta is noted; CT angiography of the chest is recommended for further evaluation.  No other significant abnormality seen in the chest. Electronically Signed   By: Marijo Conception, M.D.   On: 02/02/2018 20:49   Ct Angio Chest/abd/pel For Dissection W And/or W/wo  Result Date: 02/02/2018 CLINICAL DATA:  Back pain.  Nausea. EXAM: CT ANGIOGRAPHY CHEST, ABDOMEN AND PELVIS TECHNIQUE: Multidetector CT imaging through the chest, abdomen and pelvis was performed using the standard protocol during bolus administration of intravenous contrast. Multiplanar reconstructed images and MIPs were obtained and reviewed to evaluate the vascular anatomy. CONTRAST:  175mL ISOVUE-370 IOPAMIDOL (ISOVUE-370) INJECTION 76% COMPARISON:  01/19/2016 CT angiogram of chest, abdomen and pelvis. FINDINGS: CTA CHEST FINDINGS Cardiovascular: Mild cardiomegaly, stable. No significant pericardial fluid/thickening. Three-vessel coronary atherosclerosis status post CABG. Severely atherosclerotic thoracic aorta with extensive ulcerated mural thrombus. Patent aortic arch vessels. Diffuse aneurysmal dilatation of the descending thoracic aorta, maximum diameter 4.3 cm, stable. No evidence of acute intramural hematoma, dissection, pseudoaneurysm or penetrating atherosclerotic ulcer in the thoracic aorta. Top-normal caliber pulmonary arteries (main pulmonary artery diameter 3.0 cm). No central pulmonary emboli. Mediastinum/Nodes: No discrete thyroid nodules. Mildly patulous thoracic esophagus. No pathologically enlarged axillary, mediastinal or  hilar lymph nodes. Lungs/Pleura: No pneumothorax. No pleural effusion. Mild compressive atelectasis in the medial left lower lobe. Stable tiny calcified medial apical right upper lobe 3 mm granuloma. No acute consolidative airspace disease, lung masses or significant pulmonary nodules. Musculoskeletal: No aggressive appearing focal osseous lesions. Intact sternotomy wires. Mild thoracic spondylosis. Review of the MIP images confirms the above findings. CTA ABDOMEN AND PELVIS FINDINGS VASCULAR  Aorta: Severely atherosclerotic abdominal aorta with extensively ulcerated mural thrombus. There is irregular aneurysmal dilatation of the entire abdominal aorta with maximum diameter 4.2 cm in the suprarenal abdominal aorta and 3.6 cm in the infrarenal abdominal aorta, unchanged. No dissection, penetrating atherosclerotic ulcer, pseudoaneurysm, vasculitis or significant stenosis. Celiac: Moderate ostial stenosis, approximately 50%. No dissection, aneurysm or vasculitis. SMA: High-grade ostial stenosis, proximally 70-80%. No dissection, aneurysm or vasculitis. Renals: Single renal arteries bilaterally. Probable high-grade ostial stenosis in the renal arteries bilaterally. No aneurysm, dissection or vasculitis. IMA: Patent without evidence of aneurysm, dissection, vasculitis or significant stenosis. Inflow: High-grade stenosis in the proximal left external iliac artery, approximately 70-80%. Multifocal moderate stenoses in the bilateral common iliac arteries. Veins: Contrast reflux into the IVC and hepatic veins. Review of the MIP images confirms the above findings. NON-VASCULAR Hepatobiliary: Liver surface appears finely irregular, cannot exclude cirrhosis. No liver mass. Normal gallbladder with no radiopaque cholelithiasis. No biliary ductal dilatation. Pancreas: Normal, with no mass or duct dilation. Spleen: Normal size. No mass. Adrenals/Urinary Tract: Normal adrenals. No hydronephrosis. No renal masses. Asymmetric mild to moderate left renal atrophy. Normal bladder. Stomach/Bowel: Normal non-distended stomach. Normal caliber small bowel with no small bowel wall thickening. Appendectomy. Normal large bowel with no diverticulosis, large bowel wall thickening or pericolonic fat stranding. Vascular/Lymphatic: See above for vascular findings. No pathologically enlarged lymph nodes in the abdomen or pelvis. Reproductive: Status post hysterectomy, with no abnormal findings at the vaginal cuff. No adnexal mass. Other:  No pneumoperitoneum, ascites or focal fluid collection. Musculoskeletal: No aggressive appearing focal osseous lesions. Marked lower lumbar spondylosis. Review of the MIP images confirms the above findings. IMPRESSION: 1. No acute aortic syndrome. Severely atherosclerotic aorta with extensively ulcerated mural thrombus. Aortic Atherosclerosis (ICD10-I70.0). 2. Diffuse aneurysmal dilatation of the descending and abdominal aorta, stable since 2017 CT. Maximum descending aortic diameter 4.3 cm. Maximum abdominal aortic diameter 4.2 cm suprarenal and 3.6 cm infrarenal. 3. Multifocal significant atherosclerotic stenoses within the abdominal aortic branch vessels as detailed. 4. Cardiomegaly. Contrast reflux into the IVC and hepatic veins, suggesting right heart failure. 5. No active pulmonary disease. 6. Finely irregular liver surface, suggestive of cirrhosis. 7. Asymmetric left renal atrophy. Electronically Signed   By: Ilona Sorrel M.D.   On: 02/02/2018 23:56   12:01 AM CT C/A/P negative for dissection.  Patient's BP is starting to increase again.  She will require admission for ongoing BP control.  Spoke with Dr. Hal Hope-- will admit for ongoing care.   Larene Pickett, PA-C 02/03/18 0143    Tanna Furry, MD 02/03/18 [phone]

## 2018-02-02 NOTE — ED Triage Notes (Signed)
Initially c/o pain between shoulder blades for three days.  During triage endorses pain is coming through to center of chest and under both breasts.  Notably uncomfortable in triage.

## 2018-02-03 ENCOUNTER — Observation Stay (HOSPITAL_COMMUNITY): Payer: Medicare Other

## 2018-02-03 ENCOUNTER — Encounter (HOSPITAL_COMMUNITY): Payer: Self-pay | Admitting: Internal Medicine

## 2018-02-03 ENCOUNTER — Other Ambulatory Visit: Payer: Self-pay

## 2018-02-03 DIAGNOSIS — M461 Sacroiliitis, not elsewhere classified: Secondary | ICD-10-CM | POA: Diagnosis present

## 2018-02-03 DIAGNOSIS — I441 Atrioventricular block, second degree: Secondary | ICD-10-CM | POA: Diagnosis present

## 2018-02-03 DIAGNOSIS — I491 Atrial premature depolarization: Secondary | ICD-10-CM | POA: Diagnosis present

## 2018-02-03 DIAGNOSIS — I5032 Chronic diastolic (congestive) heart failure: Secondary | ICD-10-CM | POA: Diagnosis not present

## 2018-02-03 DIAGNOSIS — I252 Old myocardial infarction: Secondary | ICD-10-CM | POA: Diagnosis not present

## 2018-02-03 DIAGNOSIS — I361 Nonrheumatic tricuspid (valve) insufficiency: Secondary | ICD-10-CM

## 2018-02-03 DIAGNOSIS — I25118 Atherosclerotic heart disease of native coronary artery with other forms of angina pectoris: Secondary | ICD-10-CM | POA: Diagnosis not present

## 2018-02-03 DIAGNOSIS — E1151 Type 2 diabetes mellitus with diabetic peripheral angiopathy without gangrene: Secondary | ICD-10-CM | POA: Diagnosis present

## 2018-02-03 DIAGNOSIS — D500 Iron deficiency anemia secondary to blood loss (chronic): Secondary | ICD-10-CM | POA: Diagnosis present

## 2018-02-03 DIAGNOSIS — Z8672 Personal history of thrombophlebitis: Secondary | ICD-10-CM | POA: Diagnosis not present

## 2018-02-03 DIAGNOSIS — I2721 Secondary pulmonary arterial hypertension: Secondary | ICD-10-CM | POA: Diagnosis present

## 2018-02-03 DIAGNOSIS — I160 Hypertensive urgency: Secondary | ICD-10-CM | POA: Diagnosis not present

## 2018-02-03 DIAGNOSIS — I6523 Occlusion and stenosis of bilateral carotid arteries: Secondary | ICD-10-CM

## 2018-02-03 DIAGNOSIS — I272 Pulmonary hypertension, unspecified: Secondary | ICD-10-CM

## 2018-02-03 DIAGNOSIS — Z8679 Personal history of other diseases of the circulatory system: Secondary | ICD-10-CM | POA: Diagnosis not present

## 2018-02-03 DIAGNOSIS — I2510 Atherosclerotic heart disease of native coronary artery without angina pectoris: Secondary | ICD-10-CM | POA: Diagnosis not present

## 2018-02-03 DIAGNOSIS — I513 Intracardiac thrombosis, not elsewhere classified: Secondary | ICD-10-CM | POA: Diagnosis present

## 2018-02-03 DIAGNOSIS — E1122 Type 2 diabetes mellitus with diabetic chronic kidney disease: Secondary | ICD-10-CM | POA: Diagnosis present

## 2018-02-03 DIAGNOSIS — N183 Chronic kidney disease, stage 3 (moderate): Secondary | ICD-10-CM | POA: Diagnosis present

## 2018-02-03 DIAGNOSIS — R0789 Other chest pain: Secondary | ICD-10-CM | POA: Diagnosis not present

## 2018-02-03 DIAGNOSIS — I739 Peripheral vascular disease, unspecified: Secondary | ICD-10-CM | POA: Diagnosis not present

## 2018-02-03 DIAGNOSIS — I716 Thoracoabdominal aortic aneurysm, without rupture: Secondary | ICD-10-CM

## 2018-02-03 DIAGNOSIS — Z8673 Personal history of transient ischemic attack (TIA), and cerebral infarction without residual deficits: Secondary | ICD-10-CM | POA: Diagnosis not present

## 2018-02-03 DIAGNOSIS — E785 Hyperlipidemia, unspecified: Secondary | ICD-10-CM | POA: Diagnosis present

## 2018-02-03 DIAGNOSIS — I130 Hypertensive heart and chronic kidney disease with heart failure and stage 1 through stage 4 chronic kidney disease, or unspecified chronic kidney disease: Secondary | ICD-10-CM | POA: Diagnosis present

## 2018-02-03 DIAGNOSIS — I701 Atherosclerosis of renal artery: Secondary | ICD-10-CM | POA: Diagnosis present

## 2018-02-03 DIAGNOSIS — I4891 Unspecified atrial fibrillation: Secondary | ICD-10-CM

## 2018-02-03 DIAGNOSIS — I480 Paroxysmal atrial fibrillation: Secondary | ICD-10-CM

## 2018-02-03 DIAGNOSIS — R079 Chest pain, unspecified: Secondary | ICD-10-CM | POA: Diagnosis present

## 2018-02-03 DIAGNOSIS — M546 Pain in thoracic spine: Secondary | ICD-10-CM | POA: Diagnosis present

## 2018-02-03 DIAGNOSIS — M47816 Spondylosis without myelopathy or radiculopathy, lumbar region: Secondary | ICD-10-CM | POA: Diagnosis present

## 2018-02-03 LAB — CBG MONITORING, ED
GLUCOSE-CAPILLARY: 98 mg/dL (ref 65–99)
GLUCOSE-CAPILLARY: 151 mg/dL — AB (ref 65–99)

## 2018-02-03 LAB — BASIC METABOLIC PANEL
ANION GAP: 11 (ref 5–15)
BUN: 18 mg/dL (ref 6–20)
CHLORIDE: 100 mmol/L — AB (ref 101–111)
CO2: 23 mmol/L (ref 22–32)
CREATININE: 1.52 mg/dL — AB (ref 0.44–1.00)
Calcium: 9.1 mg/dL (ref 8.9–10.3)
GFR calc non Af Amer: 32 mL/min — ABNORMAL LOW (ref >60)
GFR, EST AFRICAN AMERICAN: 37 mL/min — AB (ref >60)
Glucose, Bld: 124 mg/dL — ABNORMAL HIGH (ref 65–99)
POTASSIUM: 4.5 mmol/L (ref 3.5–5.1)
SODIUM: 134 mmol/L — AB (ref 135–145)

## 2018-02-03 LAB — ECHOCARDIOGRAM COMPLETE
HEIGHTINCHES: 62.5 in
WEIGHTICAEL: 2544 [oz_av]

## 2018-02-03 LAB — CBC
HCT: 31.3 % — ABNORMAL LOW (ref 36.0–46.0)
HEMOGLOBIN: 9.9 g/dL — AB (ref 12.0–15.0)
MCH: 25.1 pg — AB (ref 26.0–34.0)
MCHC: 31.6 g/dL (ref 30.0–36.0)
MCV: 79.4 fL (ref 78.0–100.0)
PLATELETS: 237 10*3/uL (ref 150–400)
RBC: 3.94 MIL/uL (ref 3.87–5.11)
RDW: 19.3 % — ABNORMAL HIGH (ref 11.5–15.5)
WBC: 6.4 10*3/uL (ref 4.0–10.5)

## 2018-02-03 LAB — TROPONIN I
TROPONIN I: 0.06 ng/mL — AB (ref <0.03)
TROPONIN I: 0.06 ng/mL — AB (ref <0.03)
Troponin I: 0.07 ng/mL (ref <0.03)

## 2018-02-03 LAB — GLUCOSE, CAPILLARY
GLUCOSE-CAPILLARY: 121 mg/dL — AB (ref 65–99)
Glucose-Capillary: 97 mg/dL (ref 65–99)

## 2018-02-03 MED ORDER — HYDRALAZINE HCL 20 MG/ML IJ SOLN: 10.0000 mg | INTRAMUSCULAR | Status: DC | PRN

## 2018-02-03 MED ORDER — LATANOPROST 0.005 % OP SOLN
1.0000 [drp] | Freq: Every day | OPHTHALMIC | Status: DC
  Filled 2018-02-03: qty 2.5

## 2018-02-03 MED ORDER — ROSUVASTATIN CALCIUM 20 MG PO TABS
40.0000 mg | ORAL_TABLET | Freq: Every day | ORAL | Status: DC
  Administered 2018-02-03 – 2018-02-04 (×2): 40 mg via ORAL
  Filled 2018-02-03: qty 2
  Filled 2018-02-03: qty 1
  Filled 2018-02-03: qty 2

## 2018-02-03 MED ORDER — ONDANSETRON HCL 4 MG/2ML IJ SOLN: 4.0000 mg | Freq: Four times a day (QID) | INTRAMUSCULAR | Status: DC | PRN

## 2018-02-03 MED ORDER — APIXABAN 5 MG PO TABS
5.0000 mg | ORAL_TABLET | Freq: Two times a day (BID) | ORAL | Status: DC
  Administered 2018-02-03 – 2018-02-04 (×4): 5 mg via ORAL
  Filled 2018-02-03 (×5): qty 1

## 2018-02-03 MED ORDER — HYDRALAZINE HCL 25 MG PO TABS
25.0000 mg | ORAL_TABLET | Freq: Three times a day (TID) | ORAL | Status: DC
  Administered 2018-02-03 – 2018-02-05 (×6): 25 mg via ORAL
  Filled 2018-02-03 (×7): qty 1

## 2018-02-03 MED ORDER — ONDANSETRON HCL 4 MG PO TABS: 4.0000 mg | ORAL_TABLET | Freq: Four times a day (QID) | ORAL | Status: DC | PRN

## 2018-02-03 MED ORDER — APIXABAN 5 MG PO TABS
5.0000 mg | ORAL_TABLET | Freq: Two times a day (BID) | ORAL | Status: DC
  Filled 2018-02-03: qty 1

## 2018-02-03 MED ORDER — HYDROMORPHONE HCL 1 MG/ML IJ SOLN
0.5000 mg | INTRAMUSCULAR | Status: DC | PRN
  Administered 2018-02-03 – 2018-02-04 (×2): 0.5 mg via INTRAVENOUS
  Filled 2018-02-03 (×3): qty 1

## 2018-02-03 MED ORDER — NITROGLYCERIN 0.4 MG SL SUBL
0.4000 mg | SUBLINGUAL_TABLET | SUBLINGUAL | Status: DC | PRN
  Administered 2018-02-03 (×3): 0.4 mg via SUBLINGUAL
  Filled 2018-02-03: qty 1

## 2018-02-03 MED ORDER — CARVEDILOL 12.5 MG PO TABS
12.5000 mg | ORAL_TABLET | Freq: Two times a day (BID) | ORAL | Status: DC
  Administered 2018-02-03 – 2018-02-05 (×4): 12.5 mg via ORAL
  Filled 2018-02-03 (×4): qty 1

## 2018-02-03 MED ORDER — INSULIN ASPART 100 UNIT/ML ~~LOC~~ SOLN
0.0000 [IU] | Freq: Three times a day (TID) | SUBCUTANEOUS | Status: DC
  Administered 2018-02-03: 2 [IU] via SUBCUTANEOUS
  Administered 2018-02-04 (×2): 1 [IU] via SUBCUTANEOUS
  Filled 2018-02-03: qty 1

## 2018-02-03 MED ORDER — ASPIRIN EC 81 MG PO TBEC
81.0000 mg | DELAYED_RELEASE_TABLET | Freq: Every day | ORAL | Status: DC
  Administered 2018-02-03 – 2018-02-05 (×3): 81 mg via ORAL
  Filled 2018-02-03 (×3): qty 1

## 2018-02-03 NOTE — Progress Notes (Signed)
  Echocardiogram 2D Echocardiogram has been performed.  Randa Lynn Laylynn Campanella 02/03/2018, 4:27 PM

## 2018-02-03 NOTE — H&P (Signed)
Cardiology Admission History and Physical:   Patient ID: Kendra Todd; MRN: [id number]; DOB: [birth year]   Admission date: 02/02/2018  Primary Care Provider: [name], DO Primary Cardiologist: No primary care provider on file. Dushaun Okey Primary Electrophysiologist:  N/A  Chief Complaint:  L infrascapular pain  Patient Profile:   Kendra Todd is a 77 y.o. 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  History of Present Illness:   [name] began experiencing chest discomfort located in the left infrascapular area and radiating towards the lateral left chest about a week ago.  The pain became steadily more intense.  It was not associated with physical activity, but was always there.  It became extremely severe over the last 3 days and she eventually had to come to the emergency room.  She obtained relief with Dilaudid, but not with nitroglycerin.  On arrival to the emergency room she was severely hypertensive, now resolved.  When at home she had back pain even when her blood pressure was only 132/60.  She again had pain this morning when her blood pressure was around 130/60.  She did have some improvement in the left lateral chest pain this morning when she received sublingual nitroglycerin, but the back pain is persistent and unchanged.  She does not have any worsening of shortness of breath and denies diaphoresis.  She has had some nausea but no vomiting.  The pain is not positional.  The pain is not associated with cough or deep respiration.  She denies change in her pattern  of mild pedal edema, consistently worse on the right than on the left.  She has mild numbness in her right foot ever since her bypass surgery saphenectomy harvest, 5 years ago.  She denies any other focal neurological complaints.  Chest CT angiography shows evidence of a very tortuous moderate-sized thoracoabdominal type II aneurysm, measuring a maximum 4.3 cm.  There is extensive mural hematoma and very irregular plaque.  The radiologist does not think there is evidence of an aortic ulcer/intramural hematoma or dissection.  There is no evidence of contrast extravasation.  Her previous cardiovascular history is quite complex.  Even in the 1990s she had some episodes of coronary angioplasty.  She received a stent to the right coronary artery in 2002 and underwent coronary bypass surgery in January 2014.  She has not required revascularization since then.  She has previous undergone bilateral carotid endarterectomy and has recurrent carotid stenoses (40-59% on the right by ultrasound 2018), followed by Dr. Oneida Alar.  1 of her endarterectomy procedures was complicated by dissection flap with stroke, thankfully without long-term neurological sequelae.  She has bilateral renal artery stenoses demonstrated at angiography in January 2015 but they were not felt to be severe enough to cause renovascular hypertension.  She has a 50-69% stenosis in the left common iliac artery.  She has had several episodes of paroxysmal atrial fibrillation and has previous undergone electrical cardioversion at least once.  She was intolerant of amiodarone (nausea, anorexia, 20 pound weight loss).  At one point anticoagulants had to be stopped when she developed GI bleeding, but she has been back on Eliquis for about the last 4 months she does have mild microcytic  anemia.  Without serious overt bleeding complications.  Hemoglobin 9.9 today.  She has had problems with diastolic heart failure and moderate to severe pulmonary artery  hypertension (at 1.2 during heart failure exacerbation estimated systolic PA pressure was 73 mmHg).  She has to adjust her doses of diuretics occasionally.  She has CKD stage III and is followed by Dr. Madelon Lips.  She has severe systemic hypertension and it has been a challenge to identify a system of medications I will control her blood pressure without causing side effects.  Recently her blood pressure has been well controlled, outside of the episode of severe pain that led to this hospitalization.  She has extensive spondylosis in her lumbar spine.  Past Medical History:  Diagnosis Date  . Abdominal pain, epigastric   . Acute upper respiratory infections of unspecified site   . Arthritis   . Atrial fibrillation ([place]) 02/21/2011  . Benign paroxysmal positional vertigo   . CAD (coronary artery disease)    prior coronary stenting  . Candidiasis of vulva and vagina   . Carotid artery disease ([place]) 2001   s/p Bilateral CEA, after CVA  . Cervicalgia   . Decreased pedal pulses 06/04/2011   doppler - bilateral ABIs no evidence of insufficiency; L CIA slightly elevated velocities 20-30% diameter reduction;   . Dyspnea 03/13/2006   Myoview - EF 76%; mild ischemia in apical lateral region, less severe than previous study in 2002  . Edema   . H/O carotid endarterectomy 03/06/2012   CT angio -high grade stenosis of proximal R internal carotid w/ lg posterior ulceration or dissection flap; near occlusive stenosis at origin of nondominantproximal L vertebral artery; <50% stenosis of proximal R vertebral and proximal L subclavian artery  . H/O endarterectomy 09/11/2012   patent L and R carotid sites, R 60-79% restenosed ICA; L <40% restenosed ICA  . H/O endarterectomy 01/08/2012   doppler - R distal common carotid/proximal ICA stenosed 80-99%, L 0-39%  . HTN (hypertension) 11/27/2012  . Hyperlipidemia   . Hypertension   . Limb pain 05/02/2011   doppler - no evidence of thrombus of thrombophlebitis    . Long term (current) use of anticoagulants   . Loss of weight   . Lumbago   . Myalgia and myositis, unspecified   . Myocardial infarction ([place])   . Nonspecific abnormal results of liver function study   . Nontoxic uninodular goiter   . Osteoarthrosis, unspecified whether generalized or localized, unspecified site   . Other malaise and fatigue   . Other manifestations of vitamin A deficiency   . Pain in joint, pelvic region and thigh   . Pain in limb   . Peripheral vascular disease ([place])   . Phlebitis and thrombophlebitis of superficial vessels of lower extremities   . Primary localized osteoarthrosis, hand   . Pulmonary HTN ([place]) 12/03/2012  . Renal artery stenosis ([place])   . Routine gynecological examination   . Spinal stenosis, unspecified region other than cervical   . Stenosis of artery ([place]) 06/03/2012   doppler- most distal aspect of abd aorta no aneurysmal dilation; bilateral ABIs - mild L arterial insufficiency, L CIA narrowing w/ 50-69% diameter reduction; L and R SFA both w/ 0-49% diameter reductions  . Stroke ([place]) 2001  . Superficial vein thrombosis 05/11/2015   Left forehead  . Tobacco use disorder   . TR (tricuspid regurgitation), severe 12/02/12 for repair with CABG 12/03/2012  . Type II or unspecified type diabetes mellitus with peripheral circulatory  disorders, not stated as uncontrolled(250.70)   . Unspecified cataract   . Unspecified disorder of iris and ciliary body   . Unspecified vitamin D deficiency   . Urinary tract infection, site not specified     Past Surgical History:  Procedure Laterality Date  . ABDOMINAL HYSTERECTOMY  1975  . ANGIOPLASTY  1984 and 1982  . APPENDECTOMY  1954  . ARCH AORTOGRAM  03/30/2011   40% recurrent stenosis at origin of R proximal carotid patch, shelf like recurrent stenosis w/in midsection of patch that does not appear to be flow limiting; normal non-stenosed great vessel origins  . BACK SURGERY  '84,'86, '87   three previous  surgeries  . CAROTID ENDARTERECTOMY  2001   Bilateral  . CORONARY ARTERY BYPASS GRAFT  12/04/2012   Procedure: CORONARY ARTERY BYPASS GRAFTING (CABG);  Surgeon: Ivin Poot, MD; LIMA-LAD, SVG-OM, SVG-RCA  . INTRAOPERATIVE TRANSESOPHAGEAL ECHOCARDIOGRAM  12/04/2012   Procedure: INTRAOPERATIVE TRANSESOPHAGEAL ECHOCARDIOGRAM;  Surgeon: Ivin Poot, MD;  Location: [place] [facility];  Service: Open Heart Surgery;  Laterality: N/A;  . KIDNEY STONE SURGERY     Removal  . LEFT HEART CATHETERIZATION WITH CORONARY ANGIOGRAM N/A 11/28/2012   Procedure: LEFT HEART CATHETERIZATION WITH CORONARY ANGIOGRAM;  Surgeon: Leonie Man, MD;  Location: [facility] CATH LAB;  Service: Cardiovascular;  Laterality: N/A;  . PERCUTANEOUS CORONARY STENT INTERVENTION (PCI-S)  2002   2 BMS in ostial/Prox & mid RCA (mid 2.5 mm 20x  mm, & 2.75 mm x  8 mm ostial)  . RENAL ANGIOGRAM N/A 11/30/2013   Procedure: RENAL ANGIOGRAM;  Surgeon: Lorretta Harp, MD;  Location: [facility] CATH LAB;  Service: Cardiovascular;  Laterality: N/A;  . RIGHT HEART CATHETERIZATION N/A 12/02/2012   Procedure: RIGHT HEART CATH;  Surgeon: Troy Sine, MD;  Location: [facility] CATH LAB;  Service: Cardiovascular;  Laterality: N/A;  . TEE WITH CARDIOVERSION  01/23/2011   EF 83-38%; grade 2 diastolic dysfunction, elevated mean LA filling pressure, RV systolic pressure increased consistent w/ mod pulmonary hypertension     Medications Prior to Admission: Prior to Admission medications   Medication Sig Start Date End Date Taking? Authorizing Provider  alendronate (FOSAMAX) 70 MG tablet Take 70 mg by mouth once a week.  07/13/16  Yes [provider]  apixaban (ELIQUIS) 5 MG TABS tablet Take 1 tablet (5 mg total) by mouth 2 (two) times daily. 10/24/17  Yes Brandom Kerwin, MD  aspirin 81 MG tablet Take 81 mg by mouth daily.    Yes [provider]  carvedilol (COREG) 25 MG tablet Take 0.5 tablets (12.5 mg total) by mouth 2 (two) times daily with a meal. NEED OV.  01/20/18  Yes Lindsi Bayliss, MD  hydrALAZINE (APRESOLINE) 25 MG tablet Take 1 tablet (25 mg total) by mouth 3 (three) times daily. 12/26/17  Yes Reed, Tiffany L, DO  LUMIGAN 0.01 % SOLN Place 1 drop into both eyes at bedtime.  01/24/16  Yes [provider]  metFORMIN (GLUCOPHAGE) 1000 MG tablet TAKE 1 TABLET TWICE A DAY WITH MEALS Patient taking differently: TAKE 1000 mg  TABLET TWICE A DAY WITH MEALS 08/02/16  Yes Estill Dooms, MD  nitroGLYCERIN (NITROSTAT) 0.4 MG SL tablet Place 1 tablet (0.4 mg total) under the tongue every 5 (five) minutes as needed for chest pain. 01/31/18  Yes Amiria Orrison, MD  rosuvastatin (CRESTOR) 40 MG tablet Take one tablet by mouth once daily for cholesterol Patient taking differently: Take 40 mg by mouth at bedtime.  10/23/17  Yes Reed, Tiffany L, DO     Allergies:    Allergies  Allergen Reactions  . Iron Hives  . Vicodin [Hydrocodone-Acetaminophen] Other (See Comments)    Caused arrythmia   . Amiodarone Nausea And Vomiting  . Norvasc [Amlodipine Besylate] Nausea And Vomiting  . Promethazine Other (See Comments)    hallucinations  . Vioxx [Rofecoxib] Nausea And Vomiting    Social History:   Social History   Socioeconomic History  . Marital status: Widowed    Spouse name: Not on file  . Number of children: Not on file  . Years of education: Not on file  . Highest education level: Not on file  Occupational History  . Not on file  Social Needs  . Financial resource strain: Not hard at all  . Food insecurity:    Worry: Never true    Inability: Never true  . Transportation needs:    Medical: No    Non-medical: No  Tobacco Use  . Smoking status: Former Smoker    Packs/day: 0.30    Years: 57.00    Pack years: 17.10    Types: Cigarettes    Last attempt to quit: 11/19/2012    Years since quitting: 5.2  . Smokeless tobacco: Never Used  Substance and Sexual Activity  . Alcohol use: Yes    Comment: rarely, 1 a month  . Drug use: No    . Sexual activity: Never  Lifestyle  . Physical activity:    Days per week: 0 days    Minutes per session: 0 min  . Stress: Only a little  Relationships  . Social connections:    Talks on phone: More than three times a week    Gets together: More than three times a week    Attends religious service: More than 4 times per year    Active member of club or organization: Yes    Attends meetings of clubs or organizations: More than 4 times per year    Relationship status: Widowed  . Intimate partner violence:    Fear of current or ex partner: No    Emotionally abused: No    Physically abused: No    Forced sexual activity: No  Other Topics Concern  . Not on file  Social History Narrative  . Not on file    Family History:   The patient's family history includes Cancer in her other and sister; Diabetes in her brother, daughter, mother, other, and sister; Heart attack in her father and mother; Heart disease in her brother, brother, brother, father, mother, other, sister, sister, and sister; Other in her mother and other; Stroke in her brother, brother, and brother.    ROS:  Please see the history of present illness.  All other ROS reviewed and negative.     Physical Exam/Data:   Vitals:   02/03/18 0827 02/03/18 0830 02/03/18 0845 02/03/18 0915  BP: (!) 128/55 (!) 131/110 (!) 130/58 (!) 122/51  Pulse: 83 81 85 83  Resp: (!) 21 (!) 22 (!) 24 17  Temp:      TempSrc:      SpO2: 96% 97% 98% 99%  Weight:      Height:       No intake or output data in the 24 hours ending 02/03/18 0938 Filed Weights   02/02/18 1941  Weight: 159 lb (72.1 kg)   Body mass index is 28.62 kg/m.  General:  Well nourished, well developed, in no acute distress HEENT:  normal Lymph: no adenopathy Neck: no JVD,  she has bilateral carotid bruits and bilateral carotid endarterectomy scars Endocrine:  No thryomegaly Vascular: No carotid bruits; FA pulses 2+ bilaterally without bruits  Cardiac:  normal S1,  S2; RRR; no murmur  Lungs:  clear to auscultation bilaterally, no wheezing, rhonchi or rales  Abd: soft, nontender, no hepatomegaly  Ext: Trace right foot edema, no edema on the left edema Musculoskeletal:  No deformities, BUE and BLE strength normal and equal Skin: warm and dry  Neuro:  CNs 2-12 intact, no focal abnormalities noted Psych:  Normal affect    EKG:  The ECG that was done today was personally reviewed and demonstrates sinus rhythm with frequent PACs (this is not atrial fibrillation) and mild lateral ST segment depression, not really changed from previous tracings  Relevant CV Studies: Jan 2017 ECHO - Left ventricle: The cavity size was normal. There was moderate   concentric hypertrophy. Systolic function was normal. The   estimated ejection fraction was in the range of 60% to 65%. Wall   motion was normal; there were no regional wall motion   abnormalities. Features are consistent with a pseudonormal left   ventricular filling pattern, with concomitant abnormal relaxation   and increased filling pressure (grade 2 diastolic dysfunction). - Mitral valve: Calcified annulus. There was moderate regurgitation   directed eccentrically. - Left atrium: The atrium was mildly dilated. - Right ventricle: The cavity size was mildly dilated. Wall   thickness was normal. - Right atrium: The atrium was moderately dilated. - Tricuspid valve: There was moderate regurgitation. - Pulmonary arteries: Systolic pressure was severely increased. PA   peak pressure: 73 mm Hg (S).  Impressions:  - Compared to the prior study, there has been no significant   interval change.   Laboratory Data:  Chemistry Recent Labs  Lab 02/02/18 2024 02/03/18 0355  NA 134* 134*  K 4.8 4.5  CL 99* 100*  CO2 22 23  GLUCOSE 97 124*  BUN 17 18  CREATININE 1.46* 1.52*  CALCIUM 9.9 9.1  GFRNONAA 33* 32*  GFRAA 39* 37*  ANIONGAP 13 11    No results for input(s): PROT, ALBUMIN, AST, ALT, ALKPHOS,  BILITOT in the last 168 hours. Hematology Recent Labs  Lab 02/02/18 2024 02/03/18 0355  WBC 5.9 6.4  RBC 4.40 3.94  HGB 10.9* 9.9*  HCT 34.9* 31.3*  MCV 79.3 79.4  MCH 24.8* 25.1*  MCHC 31.2 31.6  RDW 18.6* 19.3*  PLT 272 237   Cardiac Enzymes Recent Labs  Lab 02/03/18 0355  TROPONINI 0.06*    Recent Labs  Lab 02/02/18 2048  TROPIPOC 0.00    BNPNo results for input(s): BNP, PROBNP in the last 168 hours.  DDimer No results for input(s): DDIMER in the last 168 hours.  Radiology/Studies:  Dg Chest 2 View  Result Date: 02/02/2018 CLINICAL DATA:  Chest pain. EXAM: CHEST - 2 VIEW COMPARISON:  Radiographs of February 18, 2013. FINDINGS: Status post coronary bypass graft. Increased aneurysmal dilatation of descending thoracic aorta is noted. No pneumothorax or pleural effusion is noted. Both lungs are clear. The visualized skeletal structures are unremarkable. IMPRESSION: Increased aneurysmal dilatation of descending thoracic aorta is noted; CT angiography of the chest is recommended for further evaluation. No other significant abnormality seen in the chest. Electronically Signed   By: Marijo Conception, M.D.   On: 02/02/2018 20:49   Ct Angio Chest/abd/pel For Dissection W And/or W/wo  Result Date: 02/02/2018 CLINICAL DATA:  Back  pain.  Nausea. EXAM: CT ANGIOGRAPHY CHEST, ABDOMEN AND PELVIS TECHNIQUE: Multidetector CT imaging through the chest, abdomen and pelvis was performed using the standard protocol during bolus administration of intravenous contrast. Multiplanar reconstructed images and MIPs were obtained and reviewed to evaluate the vascular anatomy. CONTRAST:  165mL ISOVUE-370 IOPAMIDOL (ISOVUE-370) INJECTION 76% COMPARISON:  01/19/2016 CT angiogram of chest, abdomen and pelvis. FINDINGS: CTA CHEST FINDINGS Cardiovascular: Mild cardiomegaly, stable. No significant pericardial fluid/thickening. Three-vessel coronary atherosclerosis status post CABG. Severely atherosclerotic thoracic  aorta with extensive ulcerated mural thrombus. Patent aortic arch vessels. Diffuse aneurysmal dilatation of the descending thoracic aorta, maximum diameter 4.3 cm, stable. No evidence of acute intramural hematoma, dissection, pseudoaneurysm or penetrating atherosclerotic ulcer in the thoracic aorta. Top-normal caliber pulmonary arteries (main pulmonary artery diameter 3.0 cm). No central pulmonary emboli. Mediastinum/Nodes: No discrete thyroid nodules. Mildly patulous thoracic esophagus. No pathologically enlarged axillary, mediastinal or hilar lymph nodes. Lungs/Pleura: No pneumothorax. No pleural effusion. Mild compressive atelectasis in the medial left lower lobe. Stable tiny calcified medial apical right upper lobe 3 mm granuloma. No acute consolidative airspace disease, lung masses or significant pulmonary nodules. Musculoskeletal: No aggressive appearing focal osseous lesions. Intact sternotomy wires. Mild thoracic spondylosis. Review of the MIP images confirms the above findings. CTA ABDOMEN AND PELVIS FINDINGS VASCULAR Aorta: Severely atherosclerotic abdominal aorta with extensively ulcerated mural thrombus. There is irregular aneurysmal dilatation of the entire abdominal aorta with maximum diameter 4.2 cm in the suprarenal abdominal aorta and 3.6 cm in the infrarenal abdominal aorta, unchanged. No dissection, penetrating atherosclerotic ulcer, pseudoaneurysm, vasculitis or significant stenosis. Celiac: Moderate ostial stenosis, approximately 50%. No dissection, aneurysm or vasculitis. SMA: High-grade ostial stenosis, proximally 70-80%. No dissection, aneurysm or vasculitis. Renals: Single renal arteries bilaterally. Probable high-grade ostial stenosis in the renal arteries bilaterally. No aneurysm, dissection or vasculitis. IMA: Patent without evidence of aneurysm, dissection, vasculitis or significant stenosis. Inflow: High-grade stenosis in the proximal left external iliac artery, approximately 70-80%.  Multifocal moderate stenoses in the bilateral common iliac arteries. Veins: Contrast reflux into the IVC and hepatic veins. Review of the MIP images confirms the above findings. NON-VASCULAR Hepatobiliary: Liver surface appears finely irregular, cannot exclude cirrhosis. No liver mass. Normal gallbladder with no radiopaque cholelithiasis. No biliary ductal dilatation. Pancreas: Normal, with no mass or duct dilation. Spleen: Normal size. No mass. Adrenals/Urinary Tract: Normal adrenals. No hydronephrosis. No renal masses. Asymmetric mild to moderate left renal atrophy. Normal bladder. Stomach/Bowel: Normal non-distended stomach. Normal caliber small bowel with no small bowel wall thickening. Appendectomy. Normal large bowel with no diverticulosis, large bowel wall thickening or pericolonic fat stranding. Vascular/Lymphatic: See above for vascular findings. No pathologically enlarged lymph nodes in the abdomen or pelvis. Reproductive: Status post hysterectomy, with no abnormal findings at the vaginal cuff. No adnexal mass. Other: No pneumoperitoneum, ascites or focal fluid collection. Musculoskeletal: No aggressive appearing focal osseous lesions. Marked lower lumbar spondylosis. Review of the MIP images confirms the above findings. IMPRESSION: 1. No acute aortic syndrome. Severely atherosclerotic aorta with extensively ulcerated mural thrombus. Aortic Atherosclerosis (ICD10-I70.0). 2. Diffuse aneurysmal dilatation of the descending and abdominal aorta, stable since 2017 CT. Maximum descending aortic diameter 4.3 cm. Maximum abdominal aortic diameter 4.2 cm suprarenal and 3.6 cm infrarenal. 3. Multifocal significant atherosclerotic stenoses within the abdominal aortic branch vessels as detailed. 4. Cardiomegaly. Contrast reflux into the IVC and hepatic veins, suggesting right heart failure. 5. No active pulmonary disease. 6. Finely irregular liver surface, suggestive of cirrhosis. 7. Asymmetric left renal atrophy.  Electronically  Signed   By: Ilona Sorrel M.D.   On: 02/02/2018 23:56    Assessment and Plan:   1. L infrascapular pain: This is very different from her previous angina which was consistently manifested as retrosternal heaviness.  It is not exertional or positional.  It cannot be reproduced with palpation and is not pleuritic in nature.  My biggest concern is that this may be related to aortic pathology.  Not withstanding the radiology interpretation, her lower thoracic aorta shows extreme luminal irregularities and I am still concerned that she may have a penetrating ulcer.  We will ask 1 of our cardiothoracic surgeons to review the images as well.  Her blood pressure is now very well controlled.  She had pain even when her blood pressure was normal. 2. Thoracoabdominal aneurysm with extensive mural thrombus: Minimally change in size from previous evaluation, measures 4.3 cm.  No evidence of dissection or rupture. 3. CAD: Symptoms are different from previous angina pectoris and her ECG does not show any acute changes, but she does have a borderline troponin of 0.06.  We will check an echocardiogram for wall motion abnormalities and serial troponins.  Consider nuclear imaging.  Reluctant to recommend coronary angiography due to the high risk of embolic complications (will be hard to image her grafts from upper extremity, but this will be safer than having from the femoral access).  Consider nuclear imaging test to see if there is a sizable area of ischemia, before we subject her to the risks of angiography. 4. HTN: Currently well controlled.  Continue carvedilol and hydralazine. 5. CKD: At risk for contrast-induced nephrotoxicity.  She has just received contrast for her chest CT.  Would avoid additional contrast based procedures for another 48 hours if possible.  Avoid NSAIDs and other nephrotoxic agents. 6. HLP: On high-dose of a very potent statin.  Last LDL was 103 about a month ago: may need to add  ezetimibe or PCS K9 inhibitor, to reach target LDL under 70. 7. Microcytic anemia: Likely due to chronic GI iron loss.  On aspirin and apixaban.  Chronically, will probably have to stop her aspirin, will continue for the time being while there is suspicion for acute coronary syndrome. 8. AFib: Hold apixaban.  Start heparin if troponin rises.  Severity of  Illness: The appropriate patient status for this patient is INPATIENT. Inpatient status is judged to be reasonable and necessary in order to provide the required intensity of service to ensure the patient's safety. The patient's presenting symptoms, physical exam findings, and initial radiographic and laboratory data in the context of their chronic comorbidities is felt to place them at high risk for further clinical deterioration. Furthermore, it is not anticipated that the patient will be medically stable for discharge from the hospital within 2 midnights of admission. The following factors support the patient status of inpatient.   " The patient's presenting symptoms include chest and back pain, possible angina. " The worrisome physical exam findings include irregular rhythm. " The initial radiographic and laboratory data are worrisome because of thoracoabdominal aneurysm of the aorta, anemia, abnormal cardiac enzymes. " The chronic co-morbidities include coronary artery disease, hypertension, paroxysmal atrial fibrillation, chronic kidney disease.   * I certify that at the point of admission it is my clinical judgment that the patient will require inpatient hospital care spanning beyond 2 midnights from the point of admission due to high intensity of service, high risk for further deterioration and high frequency of surveillance required.*  For questions or updates, please contact [place] Please consult [email or web address] for contact info under Cardiology/STEMI.    Signed, [name], MD  02/03/2018 9:38 AM

## 2018-02-03 NOTE — ED Notes (Signed)
Dr. Starla Link paged to inform of chest pain.

## 2018-02-03 NOTE — ED Notes (Signed)
Dr. Starla Link contacted with no new orders.

## 2018-02-03 NOTE — Progress Notes (Addendum)
Called rn in ED to receive report. RN to call me back

## 2018-02-03 NOTE — ED Notes (Signed)
Cardiology at bedside.

## 2018-02-03 NOTE — H&P (Signed)
History and Physical    Kendra Todd UXN:[id number] DOB: [birth year] DOA: 02/02/2018  PCP: [name], DO  Patient coming from: Home.  Chief Complaint: Chest pain and back pain.  HPI: Kendra Todd is a 77 y.o. female with history of CAD status post CABG, hypertension, chronic kidney disease, diabetes mellitus type 2, thoracoabdominal aneurysm, atrial fibrillation, hyperlipidemia, carotid stenosis presents to the ER with complaints of persistent chest pain radiating to the back over the last 3 days.  Pain present at rest.  Denies any shortness of breath fever chills.  Has not missed her medications.  ED Course: In the ER patient is found to be markedly hypertensive.  Blood pressure was more than 254 systolic.  Given the history of aneurysms CT angiogram of the chest and abdomen was done which did not show any acute aortic syndromes.  Did show extensive mural thrombus.  Patient was given IV hydralazine follow his blood pressure improved patient also was given pain relief medication following which chest pain improved but still mildly persist.  EKG shows sinus rhythm with changes in the inferior leads comparable to the old EKG.  Review of Systems: As per HPI, rest all negative.   Past Medical History:  Diagnosis Date  . Abdominal pain, epigastric   . Acute upper respiratory infections of unspecified site   . Arthritis   . Atrial fibrillation ([place]) 02/21/2011  . Benign paroxysmal positional vertigo   . CAD (coronary artery disease)    prior coronary stenting  . Candidiasis of vulva and vagina   . Carotid artery disease ([place]) 2001   s/p Bilateral CEA, after CVA  . Cervicalgia   . Decreased pedal pulses 06/04/2011   doppler - bilateral ABIs no evidence of insufficiency; L CIA slightly elevated velocities 20-30% diameter reduction;   . Dyspnea 03/13/2006   Myoview - EF 76%; mild ischemia in apical lateral region, less severe than previous study in 2002  . Edema   . H/O carotid  endarterectomy 03/06/2012   CT angio -high grade stenosis of proximal R internal carotid w/ lg posterior ulceration or dissection flap; near occlusive stenosis at origin of nondominantproximal L vertebral artery; <50% stenosis of proximal R vertebral and proximal L subclavian artery  . H/O endarterectomy 09/11/2012   patent L and R carotid sites, R 60-79% restenosed ICA; L <40% restenosed ICA  . H/O endarterectomy 01/08/2012   doppler - R distal common carotid/proximal ICA stenosed 80-99%, L 0-39%  . HTN (hypertension) 11/27/2012  . Hyperlipidemia   . Hypertension   . Limb pain 05/02/2011   doppler - no evidence of thrombus of thrombophlebitis  . Long term (current) use of anticoagulants   . Loss of weight   . Lumbago   . Myalgia and myositis, unspecified   . Myocardial infarction ([place])   . Nonspecific abnormal results of liver function study   . Nontoxic uninodular goiter   . Osteoarthrosis, unspecified whether generalized or localized, unspecified site   . Other malaise and fatigue   . Other manifestations of vitamin A deficiency   . Pain in joint, pelvic region and thigh   . Pain in limb   . Peripheral vascular disease ([place])   . Phlebitis and thrombophlebitis of superficial vessels of lower extremities   . Primary localized osteoarthrosis, hand   . Pulmonary HTN ([place]) 12/03/2012  . Renal artery stenosis ([place])   . Routine gynecological examination   . Spinal stenosis, unspecified region other than cervical   .  Stenosis of artery ([place]) 06/03/2012   doppler- most distal aspect of abd aorta no aneurysmal dilation; bilateral ABIs - mild L arterial insufficiency, L CIA narrowing w/ 50-69% diameter reduction; L and R SFA both w/ 0-49% diameter reductions  . Stroke ([place]) 2001  . Superficial vein thrombosis 05/11/2015   Left forehead  . Tobacco use disorder   . TR (tricuspid regurgitation), severe 12/02/12 for repair with CABG 12/03/2012  . Type II or unspecified type diabetes mellitus with  peripheral circulatory disorders, not stated as uncontrolled(250.70)   . Unspecified cataract   . Unspecified disorder of iris and ciliary body   . Unspecified vitamin D deficiency   . Urinary tract infection, site not specified     Past Surgical History:  Procedure Laterality Date  . ABDOMINAL HYSTERECTOMY  1975  . ANGIOPLASTY  1984 and 1982  . APPENDECTOMY  1954  . ARCH AORTOGRAM  03/30/2011   40% recurrent stenosis at origin of R proximal carotid patch, shelf like recurrent stenosis w/in midsection of patch that does not appear to be flow limiting; normal non-stenosed great vessel origins  . BACK SURGERY  '84,'86, '87   three previous surgeries  . CAROTID ENDARTERECTOMY  2001   Bilateral  . CORONARY ARTERY BYPASS GRAFT  12/04/2012   Procedure: CORONARY ARTERY BYPASS GRAFTING (CABG);  Surgeon: Ivin Poot, MD; LIMA-LAD, SVG-OM, SVG-RCA  . INTRAOPERATIVE TRANSESOPHAGEAL ECHOCARDIOGRAM  12/04/2012   Procedure: INTRAOPERATIVE TRANSESOPHAGEAL ECHOCARDIOGRAM;  Surgeon: Ivin Poot, MD;  Location: [facility];  Service: Open Heart Surgery;  Laterality: N/A;  . KIDNEY STONE SURGERY     Removal  . LEFT HEART CATHETERIZATION WITH CORONARY ANGIOGRAM N/A 11/28/2012   Procedure: LEFT HEART CATHETERIZATION WITH CORONARY ANGIOGRAM;  Surgeon: Leonie Man, MD;  Location: [facility] CATH LAB;  Service: Cardiovascular;  Laterality: N/A;  . PERCUTANEOUS CORONARY STENT INTERVENTION (PCI-S)  2002   2 BMS in ostial/Prox & mid RCA (mid 2.5 mm 20x  mm, & 2.75 mm x  8 mm ostial)  . RENAL ANGIOGRAM N/A 11/30/2013   Procedure: RENAL ANGIOGRAM;  Surgeon: Lorretta Harp, MD;  Location: [facility] CATH LAB;  Service: Cardiovascular;  Laterality: N/A;  . RIGHT HEART CATHETERIZATION N/A 12/02/2012   Procedure: RIGHT HEART CATH;  Surgeon: Troy Sine, MD;  Location: [facility] CATH LAB;  Service: Cardiovascular;  Laterality: N/A;  . TEE WITH CARDIOVERSION  01/23/2011   EF 16-10%; grade 2 diastolic dysfunction, elevated mean LA  filling pressure, RV systolic pressure increased consistent w/ mod pulmonary hypertension     reports that she quit smoking about 5 years ago. Her smoking use included cigarettes. She has a 17.10 pack-year smoking history. She has never used smokeless tobacco. She reports that she drinks alcohol. She reports that she does not use drugs.  Allergies  Allergen Reactions  . Iron Hives  . Vicodin [Hydrocodone-Acetaminophen] Other (See Comments)    Caused arrythmia   . Amiodarone Nausea And Vomiting  . Norvasc [Amlodipine Besylate] Nausea And Vomiting  . Promethazine Other (See Comments)    hallucinations  . Vioxx [Rofecoxib] Nausea And Vomiting    Family History  Problem Relation Age of Onset  . Other Mother        CVA  . Diabetes Mother   . Heart disease Mother        Heart disease before age 57  . Heart attack Mother   . Heart disease Father        Heart Disease before age 25  .  Heart attack Father   . Heart disease Sister        Amputation  . Diabetes Sister   . Heart disease Brother        Heart disease before age 59  . Diabetes Brother   . Stroke Brother   . Stroke Brother   . Heart disease Brother   . Stroke Brother   . Heart disease Brother   . Heart disease Sister   . Heart disease Sister   . Diabetes Daughter   . Cancer Sister   . Diabetes Other   . Cancer Other        breast  . Heart disease Other        cad  . Other Other        cva    Prior to Admission medications   Medication Sig Start Date End Date Taking? Authorizing Provider  alendronate (FOSAMAX) 70 MG tablet Take 70 mg by mouth once a week.  07/13/16  Yes [provider]  apixaban (ELIQUIS) 5 MG TABS tablet Take 1 tablet (5 mg total) by mouth 2 (two) times daily. 10/24/17  Yes Croitoru, Mihai, MD  aspirin 81 MG tablet Take 81 mg by mouth daily.    Yes [provider]  carvedilol (COREG) 25 MG tablet Take 0.5 tablets (12.5 mg total) by mouth 2 (two) times daily with a meal. NEED  OV. 01/20/18  Yes Croitoru, Mihai, MD  hydrALAZINE (APRESOLINE) 25 MG tablet Take 1 tablet (25 mg total) by mouth 3 (three) times daily. 12/26/17  Yes Reed, Tiffany L, DO  LUMIGAN 0.01 % SOLN Place 1 drop into both eyes at bedtime.  01/24/16  Yes [provider]  metFORMIN (GLUCOPHAGE) 1000 MG tablet TAKE 1 TABLET TWICE A DAY WITH MEALS Patient taking differently: TAKE 1000 mg  TABLET TWICE A DAY WITH MEALS 08/02/16  Yes Estill Dooms, MD  nitroGLYCERIN (NITROSTAT) 0.4 MG SL tablet Place 1 tablet (0.4 mg total) under the tongue every 5 (five) minutes as needed for chest pain. 01/31/18  Yes Croitoru, Mihai, MD  rosuvastatin (CRESTOR) 40 MG tablet Take one tablet by mouth once daily for cholesterol Patient taking differently: Take 40 mg by mouth at bedtime.  10/23/17  Yes [name], DO    Physical Exam: Vitals:   02/03/18 0130 02/03/18 0200 02/03/18 0230 02/03/18 0300  BP: (!) 170/70 (!) 179/76 (!) 151/71 139/68  Pulse: 85 86 93 84  Resp: 20 (!) 25 15 17   Temp:      TempSrc:      SpO2: 94% 100% (!) 89% 99%  Weight:      Height:          Constitutional: Moderately built and nourished. Vitals:   02/03/18 0130 02/03/18 0200 02/03/18 0230 02/03/18 0300  BP: (!) 170/70 (!) 179/76 (!) 151/71 139/68  Pulse: 85 86 93 84  Resp: 20 (!) 25 15 17   Temp:      TempSrc:      SpO2: 94% 100% (!) 89% 99%  Weight:      Height:       Eyes: Anicteric no pallor. ENMT: No discharge from the ears eyes nose or mouth. Neck: No mass palpated no neck rigidity. Respiratory: No rhonchi or crepitations. Cardiovascular: S1-S2 heard no murmurs appreciated. Abdomen: Soft nontender bowel sounds present. Musculoskeletal: No edema.  No joint effusion. Skin: No rash.  Skin appears warm. Neurologic: Alert awake oriented to time place and person.  Moves all extremities.  Psychiatric: Appears normal.  Normal affect.   Labs on Admission: I have personally reviewed following labs and imaging  studies  CBC: Recent Labs  Lab 02/02/18 2024  WBC 5.9  HGB 10.9*  HCT 34.9*  MCV 79.3  PLT 185   Basic Metabolic Panel: Recent Labs  Lab 02/02/18 2024  NA 134*  K 4.8  CL 99*  CO2 22  GLUCOSE 97  BUN 17  CREATININE 1.46*  CALCIUM 9.9   GFR: Estimated Creatinine Clearance: 29.9 mL/min (A) (by C-G formula based on SCr of 1.46 mg/dL (H)). Liver Function Tests: No results for input(s): AST, ALT, ALKPHOS, BILITOT, PROT, ALBUMIN in the last 168 hours. No results for input(s): LIPASE, AMYLASE in the last 168 hours. No results for input(s): AMMONIA in the last 168 hours. Coagulation Profile: No results for input(s): INR, PROTIME in the last 168 hours. Cardiac Enzymes: No results for input(s): CKTOTAL, CKMB, CKMBINDEX, TROPONINI in the last 168 hours. BNP (last 3 results) No results for input(s): PROBNP in the last 8760 hours. HbA1C: No results for input(s): HGBA1C in the last 72 hours. CBG: No results for input(s): GLUCAP in the last 168 hours. Lipid Profile: No results for input(s): CHOL, HDL, LDLCALC, TRIG, CHOLHDL, LDLDIRECT in the last 72 hours. Thyroid Function Tests: No results for input(s): TSH, T4TOTAL, FREET4, T3FREE, THYROIDAB in the last 72 hours. Anemia Panel: No results for input(s): VITAMINB12, FOLATE, FERRITIN, TIBC, IRON, RETICCTPCT in the last 72 hours. Urine analysis:    Component Value Date/Time   COLORURINE YELLOW 07/09/2014 0955   APPEARANCEUR CLOUDY (A) 07/09/2014 0955   LABSPEC 1.010 07/09/2014 0955   PHURINE 7.0 07/09/2014 0955   GLUCOSEU NEGATIVE 07/09/2014 0955   HGBUR NEGATIVE 07/09/2014 0955   BILIRUBINUR neg 07/18/2017 1118   KETONESUR NEGATIVE 07/09/2014 0955   PROTEINUR 100++ 07/18/2017 1118   PROTEINUR NEGATIVE 07/09/2014 0955   UROBILINOGEN negative (A) 07/18/2017 1118   UROBILINOGEN 1.0 07/09/2014 0955   NITRITE neg 07/18/2017 1118   NITRITE NEGATIVE 07/09/2014 0955   LEUKOCYTESUR Moderate (2+) (A) 07/18/2017 1118   Sepsis  Labs: @LABRCNTIP (procalcitonin:4,lacticidven:4) )No results found for this or any previous visit (from the past 240 hour(s)).   Radiological Exams on Admission: Dg Chest 2 View  Result Date: 02/02/2018 CLINICAL DATA:  Chest pain. EXAM: CHEST - 2 VIEW COMPARISON:  Radiographs of February 18, 2013. FINDINGS: Status post coronary bypass graft. Increased aneurysmal dilatation of descending thoracic aorta is noted. No pneumothorax or pleural effusion is noted. Both lungs are clear. The visualized skeletal structures are unremarkable. IMPRESSION: Increased aneurysmal dilatation of descending thoracic aorta is noted; CT angiography of the chest is recommended for further evaluation. No other significant abnormality seen in the chest. Electronically Signed   By: Marijo Conception, M.D.   On: 02/02/2018 20:49   Ct Angio Chest/abd/pel For Dissection W And/or W/wo  Result Date: 02/02/2018 CLINICAL DATA:  Back pain.  Nausea. EXAM: CT ANGIOGRAPHY CHEST, ABDOMEN AND PELVIS TECHNIQUE: Multidetector CT imaging through the chest, abdomen and pelvis was performed using the standard protocol during bolus administration of intravenous contrast. Multiplanar reconstructed images and MIPs were obtained and reviewed to evaluate the vascular anatomy. CONTRAST:  16mL ISOVUE-370 IOPAMIDOL (ISOVUE-370) INJECTION 76% COMPARISON:  01/19/2016 CT angiogram of chest, abdomen and pelvis. FINDINGS: CTA CHEST FINDINGS Cardiovascular: Mild cardiomegaly, stable. No significant pericardial fluid/thickening. Three-vessel coronary atherosclerosis status post CABG. Severely atherosclerotic thoracic aorta with extensive ulcerated mural thrombus. Patent aortic arch vessels. Diffuse aneurysmal dilatation of the descending thoracic  aorta, maximum diameter 4.3 cm, stable. No evidence of acute intramural hematoma, dissection, pseudoaneurysm or penetrating atherosclerotic ulcer in the thoracic aorta. Top-normal caliber pulmonary arteries (main pulmonary  artery diameter 3.0 cm). No central pulmonary emboli. Mediastinum/Nodes: No discrete thyroid nodules. Mildly patulous thoracic esophagus. No pathologically enlarged axillary, mediastinal or hilar lymph nodes. Lungs/Pleura: No pneumothorax. No pleural effusion. Mild compressive atelectasis in the medial left lower lobe. Stable tiny calcified medial apical right upper lobe 3 mm granuloma. No acute consolidative airspace disease, lung masses or significant pulmonary nodules. Musculoskeletal: No aggressive appearing focal osseous lesions. Intact sternotomy wires. Mild thoracic spondylosis. Review of the MIP images confirms the above findings. CTA ABDOMEN AND PELVIS FINDINGS VASCULAR Aorta: Severely atherosclerotic abdominal aorta with extensively ulcerated mural thrombus. There is irregular aneurysmal dilatation of the entire abdominal aorta with maximum diameter 4.2 cm in the suprarenal abdominal aorta and 3.6 cm in the infrarenal abdominal aorta, unchanged. No dissection, penetrating atherosclerotic ulcer, pseudoaneurysm, vasculitis or significant stenosis. Celiac: Moderate ostial stenosis, approximately 50%. No dissection, aneurysm or vasculitis. SMA: High-grade ostial stenosis, proximally 70-80%. No dissection, aneurysm or vasculitis. Renals: Single renal arteries bilaterally. Probable high-grade ostial stenosis in the renal arteries bilaterally. No aneurysm, dissection or vasculitis. IMA: Patent without evidence of aneurysm, dissection, vasculitis or significant stenosis. Inflow: High-grade stenosis in the proximal left external iliac artery, approximately 70-80%. Multifocal moderate stenoses in the bilateral common iliac arteries. Veins: Contrast reflux into the IVC and hepatic veins. Review of the MIP images confirms the above findings. NON-VASCULAR Hepatobiliary: Liver surface appears finely irregular, cannot exclude cirrhosis. No liver mass. Normal gallbladder with no radiopaque cholelithiasis. No biliary  ductal dilatation. Pancreas: Normal, with no mass or duct dilation. Spleen: Normal size. No mass. Adrenals/Urinary Tract: Normal adrenals. No hydronephrosis. No renal masses. Asymmetric mild to moderate left renal atrophy. Normal bladder. Stomach/Bowel: Normal non-distended stomach. Normal caliber small bowel with no small bowel wall thickening. Appendectomy. Normal large bowel with no diverticulosis, large bowel wall thickening or pericolonic fat stranding. Vascular/Lymphatic: See above for vascular findings. No pathologically enlarged lymph nodes in the abdomen or pelvis. Reproductive: Status post hysterectomy, with no abnormal findings at the vaginal cuff. No adnexal mass. Other: No pneumoperitoneum, ascites or focal fluid collection. Musculoskeletal: No aggressive appearing focal osseous lesions. Marked lower lumbar spondylosis. Review of the MIP images confirms the above findings. IMPRESSION: 1. No acute aortic syndrome. Severely atherosclerotic aorta with extensively ulcerated mural thrombus. Aortic Atherosclerosis (ICD10-I70.0). 2. Diffuse aneurysmal dilatation of the descending and abdominal aorta, stable since 2017 CT. Maximum descending aortic diameter 4.3 cm. Maximum abdominal aortic diameter 4.2 cm suprarenal and 3.6 cm infrarenal. 3. Multifocal significant atherosclerotic stenoses within the abdominal aortic branch vessels as detailed. 4. Cardiomegaly. Contrast reflux into the IVC and hepatic veins, suggesting right heart failure. 5. No active pulmonary disease. 6. Finely irregular liver surface, suggestive of cirrhosis. 7. Asymmetric left renal atrophy. Electronically Signed   By: Ilona Sorrel M.D.   On: 02/02/2018 23:56    EKG: Independently reviewed.  Normal sinus rhythm with inferior lead changes comparable to the old EKG.  Assessment/Plan Active Problems:   Bilateral carotid artery stenosis   PAD (peripheral artery disease) (HCC)   CAD (coronary artery disease): 1993 PTCA to circumflex.   2002 2 BMS stents to ostial, S/P CABG x 3 12/04/12   TR (tricuspid regurgitation), severe 12/02/12 for repair with CABG   Pulmonary HTN ([place])   Atrial fibrillation (H[facility])   Thoracoabdominal aortic aneurysm, without rupture ([place])  Sacroiliitis (HCC)   Hypertensive urgency    1. Hypertensive urgency -patient was placed on IV hydralazine and will continue patient's home dose of Coreg and hydralazine.  Patient's blood pressure persistently be elevated may have to increase dose of hydralazine or add other antihypertensives. 2. Upper back pain and chest pain likely from uncontrolled hypertension.  CAT scan does show extensive mural thrombus.  We will cycle cardiac markers at this time.  If pain persist may discuss with patient's vascular surgeon.  Patient on pain relief medications. 3. History of CAD status post CABG -patient is on apixaban statins Coreg.  Cycle cardiac markers.  Will check 2D echo. 4. Atrial fibrillation presently in sinus rhythm.  On apixaban and Coreg. 5. Chronic kidney disease stage III -creatinine appears to be at baseline. 6. Anemia likely from kidney disease -follow CBC. 7. History of sacroiliitis.   DVT prophylaxis: Apixaban. Code Status: Full code. Family Communication: Family at the bedside. Disposition Plan: Home. Consults called: Cardiology. Admission status: Observation.   Rise Patience MD [facility] Pager [phone].  If 7PM-7AM, please contact night-coverage [email or web address] Password [email or web address]  02/03/2018, 3:16 AM

## 2018-02-03 NOTE — ED Notes (Signed)
Pt states  Now chest pain free.

## 2018-02-03 NOTE — Progress Notes (Signed)
Patient ID: Kendra Todd, female   DOB: [birth year], 77 y.o.   MRN: [id number] Cardiology team has taken over the care of the patient and will be the primary while the patient is in the hospital.  Hospitalist service will sign off.  Notifed Dr. [name].

## 2018-02-03 NOTE — ED Notes (Signed)
Dr. Starla Link paged with troponin result.

## 2018-02-03 NOTE — ED Notes (Signed)
Spoke with Dr. Loletha Grayer.  Pt will be switched. To tele

## 2018-02-04 DIAGNOSIS — I25118 Atherosclerotic heart disease of native coronary artery with other forms of angina pectoris: Secondary | ICD-10-CM

## 2018-02-04 DIAGNOSIS — I716 Thoracoabdominal aortic aneurysm, without rupture: Secondary | ICD-10-CM

## 2018-02-04 DIAGNOSIS — I5032 Chronic diastolic (congestive) heart failure: Secondary | ICD-10-CM

## 2018-02-04 LAB — GLUCOSE, CAPILLARY
GLUCOSE-CAPILLARY: 96 mg/dL (ref 65–99)
GLUCOSE-CAPILLARY: 109 mg/dL — AB (ref 65–99)
Glucose-Capillary: 122 mg/dL — ABNORMAL HIGH (ref 65–99)
Glucose-Capillary: 138 mg/dL — ABNORMAL HIGH (ref 65–99)

## 2018-02-04 MED ORDER — TRAMADOL-ACETAMINOPHEN 37.5-325 MG PO TABS
2.0000 | ORAL_TABLET | Freq: Four times a day (QID) | ORAL | Status: DC | PRN
  Administered 2018-02-04 – 2018-02-05 (×2): 2 via ORAL
  Filled 2018-02-04 (×2): qty 2

## 2018-02-04 NOTE — Progress Notes (Addendum)
  Subjective:  CTA, 2D Echo images reviewed Significant atherosclerotic disease of the entire descending thoracic and abdominal aorta with mild-moderate aneurysmal enlargement Would not recommend TEVAR Recommend cont statin, BP meds and 81 ASA ECho with normal L:V fx Objective: Vital signs in last 24 hours: Temp:  [98.2 F (36.8 C)-98.6 F (37 C)] 98.6 F (37 C) (04/02 0516) Pulse Rate:  [59-87] 70 (04/02 0516) Cardiac Rhythm: Normal sinus rhythm (04/01 2030) Resp:  [14-24] 18 (04/02 0516) BP: (96-142)/(40-110) 112/95 (04/02 0516) SpO2:  [95 %-100 %] 95 % (04/02 0516)       Intake/Output from previous day: No intake/output data recorded. Intake/Output this shift: No intake/output data recorded.    Lab Results: Recent Labs    02/02/18 2024 02/03/18 0355  WBC 5.9 6.4  HGB 10.9* 9.9*  HCT 34.9* 31.3*  PLT 272 237   BMET:  Recent Labs    02/02/18 2024 02/03/18 0355  NA 134* 134*  K 4.8 4.5  CL 99* 100*  CO2 22 23  GLUCOSE 97 124*  BUN 17 18  CREATININE 1.46* 1.52*  CALCIUM 9.9 9.1    PT/INR: No results for input(s): LABPROT, INR in the last 72 hours. ABG    Component Value Date/Time   PHART 7.360 12/05/2012 0122   HCO3 22.4 12/05/2012 0122   TCO2 23 12/05/2012 1628   ACIDBASEDEF 3.0 (H) 12/05/2012 0122   O2SAT 97.0 12/05/2012 0122   CBG (last 3)  Recent Labs    02/03/18 1428 02/03/18 1749 02/03/18 2241  GLUCAP 151* 97 121*    Assessment/Plan: S/P  Cont medical therapy Full consult to follow  LOS: 1 day    Tharon Aquas Trigt III 02/04/2018

## 2018-02-04 NOTE — Care Management Note (Signed)
Case Management Note  Patient Details  Name: LILIANA DANG MRN: [id number] Date of Birth: 01/22/1941  Subjective/Objective:  Admitted with HTN urgency, hx of CAD status post CABG, hypertension, chronic kidney disease, diabetes mellitus type 2, thoracoabdominal aneurysm, atrial fibrillation, hyperlipidemia, carotid stenosis. From home with daughter. Independent with ADL's , no DME usage PTA.         Amairani Shuey (Daughter)      [phone]          PCP: Hollace Kinnier  Action/Plan: Transition to home when medically stable. CM following for d/c needs.  Expected Discharge Date:  02/04/18               Expected Discharge Plan:     In-House Referral:     Discharge planning Services     Post Acute Care Choice:    Choice offered to:     DME Arranged:    DME Agency:     HH Arranged:    HH Agency:     Status of Service:   in process  If discussed at [employer] of Stay Meetings, dates discussed:    Additional Comments:  Sharin Mons, RN 02/04/2018, 4:00 PM

## 2018-02-04 NOTE — Progress Notes (Addendum)
Progress Note  Patient Name: Kendra Todd Date of Encounter: 02/04/2018  Primary Cardiologist: No primary care provider on file.   Subjective   Continues to have back pain, now bilateral.  Had to have a dose of Dilaudid overnight.  A little better today. Echo does not show any regional wall motion abnormalities. Appreciate Dr. Lucianne Lei Trigt's review of the CT angiogram of the aorta. Blood pressure is well controlled.  Occasionally elevated when she has worse pain. Cardiac troponin I with a minimal elevation "plateau" pattern (0.06-0.07-0.06)  Inpatient Medications    Scheduled Meds: . apixaban  5 mg Oral BID  . aspirin EC  81 mg Oral Daily  . carvedilol  12.5 mg Oral BID WC  . hydrALAZINE  25 mg Oral Q8H  . insulin aspart  0-9 Units Subcutaneous TID WC  . latanoprost  1 drop Both Eyes QHS  . rosuvastatin  40 mg Oral QHS   Continuous Infusions:  PRN Meds: hydrALAZINE, HYDROmorphone (DILAUDID) injection, nitroGLYCERIN, ondansetron **OR** ondansetron (ZOFRAN) IV, traMADol-acetaminophen   Vital Signs    Vitals:   02/03/18 1700 02/03/18 2245 02/04/18 0516 02/04/18 0841  BP: (!) 96/40 (!) 105/42 (!) 112/95 (!) 170/57  Pulse: 63 63 70 77  Resp: 15 18 18    Temp:  98.2 F (36.8 C) 98.6 F (37 C)   TempSrc:  Oral Oral   SpO2: 97% 97% 95%   Weight:      Height:        Intake/Output Summary (Last 24 hours) at 02/04/2018 1054 Last data filed at 02/04/2018 4166 Gross per 24 hour  Intake 200 ml  Output -  Net 200 ml   Filed Weights   02/02/18 1941  Weight: 159 lb (72.1 kg)    Telemetry    Sinus rhythm with PACs and second-degree AV block Mobitz type I- Personally Reviewed  ECG    No new tracing- Personally Reviewed  Physical Exam  Calm and relaxed this morning GEN: No acute distress.   Neck: No JVD Cardiac: RRR occasional ectopy, no murmurs, rubs, or gallops.  Respiratory: Clear to auscultation bilaterally. GI: Soft, nontender, non-distended  MS: No edema; No  deformity. Neuro:  Nonfocal  Psych: Normal affect   Labs    Chemistry Recent Labs  Lab 02/02/18 2024 02/03/18 0355  NA 134* 134*  K 4.8 4.5  CL 99* 100*  CO2 22 23  GLUCOSE 97 124*  BUN 17 18  CREATININE 1.46* 1.52*  CALCIUM 9.9 9.1  GFRNONAA 33* 32*  GFRAA 39* 37*  ANIONGAP 13 11     Hematology Recent Labs  Lab 02/02/18 2024 02/03/18 0355  WBC 5.9 6.4  RBC 4.40 3.94  HGB 10.9* 9.9*  HCT 34.9* 31.3*  MCV 79.3 79.4  MCH 24.8* 25.1*  MCHC 31.2 31.6  RDW 18.6* 19.3*  PLT 272 237    Cardiac Enzymes Recent Labs  Lab 02/03/18 0355 02/03/18 1037 02/03/18 1517  TROPONINI 0.06* 0.07* 0.06*    Recent Labs  Lab 02/02/18 2048  TROPIPOC 0.00     BNPNo results for input(s): BNP, PROBNP in the last 168 hours.   DDimer No results for input(s): DDIMER in the last 168 hours.   Radiology    Dg Chest 2 View  Result Date: 02/02/2018 CLINICAL DATA:  Chest pain. EXAM: CHEST - 2 VIEW COMPARISON:  Radiographs of February 18, 2013. FINDINGS: Status post coronary bypass graft. Increased aneurysmal dilatation of descending thoracic aorta is noted. No pneumothorax or pleural effusion  is noted. Both lungs are clear. The visualized skeletal structures are unremarkable. IMPRESSION: Increased aneurysmal dilatation of descending thoracic aorta is noted; CT angiography of the chest is recommended for further evaluation. No other significant abnormality seen in the chest. Electronically Signed   By: Marijo Conception, M.D.   On: 02/02/2018 20:49   Ct Angio Chest/abd/pel For Dissection W And/or W/wo  Result Date: 02/02/2018 CLINICAL DATA:  Back pain.  Nausea. EXAM: CT ANGIOGRAPHY CHEST, ABDOMEN AND PELVIS TECHNIQUE: Multidetector CT imaging through the chest, abdomen and pelvis was performed using the standard protocol during bolus administration of intravenous contrast. Multiplanar reconstructed images and MIPs were obtained and reviewed to evaluate the vascular anatomy. CONTRAST:  171mL  ISOVUE-370 IOPAMIDOL (ISOVUE-370) INJECTION 76% COMPARISON:  01/19/2016 CT angiogram of chest, abdomen and pelvis. FINDINGS: CTA CHEST FINDINGS Cardiovascular: Mild cardiomegaly, stable. No significant pericardial fluid/thickening. Three-vessel coronary atherosclerosis status post CABG. Severely atherosclerotic thoracic aorta with extensive ulcerated mural thrombus. Patent aortic arch vessels. Diffuse aneurysmal dilatation of the descending thoracic aorta, maximum diameter 4.3 cm, stable. No evidence of acute intramural hematoma, dissection, pseudoaneurysm or penetrating atherosclerotic ulcer in the thoracic aorta. Top-normal caliber pulmonary arteries (main pulmonary artery diameter 3.0 cm). No central pulmonary emboli. Mediastinum/Nodes: No discrete thyroid nodules. Mildly patulous thoracic esophagus. No pathologically enlarged axillary, mediastinal or hilar lymph nodes. Lungs/Pleura: No pneumothorax. No pleural effusion. Mild compressive atelectasis in the medial left lower lobe. Stable tiny calcified medial apical right upper lobe 3 mm granuloma. No acute consolidative airspace disease, lung masses or significant pulmonary nodules. Musculoskeletal: No aggressive appearing focal osseous lesions. Intact sternotomy wires. Mild thoracic spondylosis. Review of the MIP images confirms the above findings. CTA ABDOMEN AND PELVIS FINDINGS VASCULAR Aorta: Severely atherosclerotic abdominal aorta with extensively ulcerated mural thrombus. There is irregular aneurysmal dilatation of the entire abdominal aorta with maximum diameter 4.2 cm in the suprarenal abdominal aorta and 3.6 cm in the infrarenal abdominal aorta, unchanged. No dissection, penetrating atherosclerotic ulcer, pseudoaneurysm, vasculitis or significant stenosis. Celiac: Moderate ostial stenosis, approximately 50%. No dissection, aneurysm or vasculitis. SMA: High-grade ostial stenosis, proximally 70-80%. No dissection, aneurysm or vasculitis. Renals: Single  renal arteries bilaterally. Probable high-grade ostial stenosis in the renal arteries bilaterally. No aneurysm, dissection or vasculitis. IMA: Patent without evidence of aneurysm, dissection, vasculitis or significant stenosis. Inflow: High-grade stenosis in the proximal left external iliac artery, approximately 70-80%. Multifocal moderate stenoses in the bilateral common iliac arteries. Veins: Contrast reflux into the IVC and hepatic veins. Review of the MIP images confirms the above findings. NON-VASCULAR Hepatobiliary: Liver surface appears finely irregular, cannot exclude cirrhosis. No liver mass. Normal gallbladder with no radiopaque cholelithiasis. No biliary ductal dilatation. Pancreas: Normal, with no mass or duct dilation. Spleen: Normal size. No mass. Adrenals/Urinary Tract: Normal adrenals. No hydronephrosis. No renal masses. Asymmetric mild to moderate left renal atrophy. Normal bladder. Stomach/Bowel: Normal non-distended stomach. Normal caliber small bowel with no small bowel wall thickening. Appendectomy. Normal large bowel with no diverticulosis, large bowel wall thickening or pericolonic fat stranding. Vascular/Lymphatic: See above for vascular findings. No pathologically enlarged lymph nodes in the abdomen or pelvis. Reproductive: Status post hysterectomy, with no abnormal findings at the vaginal cuff. No adnexal mass. Other: No pneumoperitoneum, ascites or focal fluid collection. Musculoskeletal: No aggressive appearing focal osseous lesions. Marked lower lumbar spondylosis. Review of the MIP images confirms the above findings. IMPRESSION: 1. No acute aortic syndrome. Severely atherosclerotic aorta with extensively ulcerated mural thrombus. Aortic Atherosclerosis (ICD10-I70.0). 2. Diffuse aneurysmal dilatation of  the descending and abdominal aorta, stable since 2017 CT. Maximum descending aortic diameter 4.3 cm. Maximum abdominal aortic diameter 4.2 cm suprarenal and 3.6 cm infrarenal. 3.  Multifocal significant atherosclerotic stenoses within the abdominal aortic branch vessels as detailed. 4. Cardiomegaly. Contrast reflux into the IVC and hepatic veins, suggesting right heart failure. 5. No active pulmonary disease. 6. Finely irregular liver surface, suggestive of cirrhosis. 7. Asymmetric left renal atrophy. Electronically Signed   By: Ilona Sorrel M.D.   On: 02/02/2018 23:56    Cardiac Studies  ECHO February 03, 2018  - Left ventricle: The cavity size was normal. Wall thickness was   increased in a pattern of moderate LVH. Systolic function was   normal. The estimated ejection fraction was in the range of 60%   to 65%. Wall motion was normal; there were no regional wall   motion abnormalities. Features are consistent with a pseudonormal   left ventricular filling pattern, with concomitant abnormal   relaxation and increased filling pressure (grade 2 diastolic   dysfunction). - Aortic valve: Moderately calcified annulus. - Left atrium: The atrium was severely dilated. - Tricuspid valve: There was mild-moderate regurgitation. - Pulmonary arteries: Systolic pressure was moderately increased.   PA peak pressure: 49 mm Hg (S).  Patient Profile     77 y.o. female with extensive atherosclerotic disease involving the coronary arteries, thoracoabdominal aortic aneurysm, lower extremities, renal arteries, systemic hypertension, chronic kidney disease, history of paroxysmal atrial fibrillation, hyperlipidemia presenting with severe left infrascapular pain, now bilateral. No evidence of acute coronary syndrome by ECG, cardiac enzymes or echo wall motion analysis. CT angiogram of the aorta images reviewed by Dr. Prescott Gum, recommends medical management  Assessment & Plan    1. Thoracic back pain:  More and more this appears to be musculoskeletal in pattern.  Says are contraindicated due to renal dysfunction and iron deficiency anemia with use of anticoagulants.  She was told not to use  Tylenol with codeine by her nephrologist.  She has a history of allergic response to hydrocodone.  We will try tramadol. 2. Thoracoabdominal aneurysm with extensive mural thrombus: Minimally change in size from previous evaluation, measures 4.3 cm.  No evidence of dissection or rupture.  Extensive ulcerative atherosclerosis, but no indication for intervention at this time. 3. CAD:  Symptoms are very different from her previous angina do not have a pattern consistent with coronary insufficiency.  ECG is low risk.  Echo does not show regional wall motion abnormalities.  The cardiac enzyme elevation is minimal and a plateau pattern.  While this may reflect adverse long-term prognosis, it is not indicative of an acute coronary event.  She is at high risk for mechanical complications and embolic events as well as renal insufficiency with cardiac catheterization.  We will manage medically. 4. HTN:  Very well controlled, except when her pain kicks in.  Continue carvedilol and hydralazine.  Avoid RAAS inhibitors 5. CKD:  Avoid NSAIDs, RAAS inhibitors, contrast. 6. HLP: On high-dose of a very potent statin.  Last LDL was 103 about a month ago:  Plan to add ezetimibe or PCS K9 inhibitor, to reach target LDL under 70. 7. Microcytic anemia: Likely due to chronic GI iron loss.  On apixaban and aspirin - will plan to stop one. Hard to say whether her risk of embolic stroke from atrial fibrillation or complications of extensive atherosclerotic disease is the bigger concern.  Overall, I think stopping the Eliquis is the better choice.. 8. AFib:  None seen  on this admission, rhythm is irregular due to frequent PACs and episodes of Mobitz type I AV block.  Hold apixaban.   9. CHF: Echocardiogram shows evidence of elevated filling pressures, but she does not have any symptoms of heart failure.  Would avoid diuretic therapy due to the risk to her kidney function, unless she has dyspnea or overt clinical hypervolemia.  Note that  her pulmonary artery pressure has actually improved since her previous echocardiogram, suggesting better filling pressures.     For questions or updates, please contact [place] Please consult [email or web address] for contact info under Cardiology/STEMI.      Signed, [name], MD  02/04/2018, 10:54 AM

## 2018-02-05 DIAGNOSIS — R0789 Other chest pain: Secondary | ICD-10-CM

## 2018-02-05 DIAGNOSIS — I2510 Atherosclerotic heart disease of native coronary artery without angina pectoris: Secondary | ICD-10-CM

## 2018-02-05 DIAGNOSIS — M546 Pain in thoracic spine: Secondary | ICD-10-CM

## 2018-02-05 LAB — GLUCOSE, CAPILLARY: GLUCOSE-CAPILLARY: 111 mg/dL — AB (ref 65–99)

## 2018-02-05 MED ORDER — TRAMADOL-ACETAMINOPHEN 37.5-325 MG PO TABS: 1.0000 | ORAL_TABLET | Freq: Four times a day (QID) | ORAL | 0 refills | Status: AC | PRN

## 2018-02-05 NOTE — Progress Notes (Signed)
Progress Note  Patient Name: Kendra Todd Date of Encounter: 02/05/2018  Primary Cardiologist: No primary care provider on file.   Subjective   Feeling much better, smiling this morning.  Had almost complete relief of back pain with Ultracet.  Inpatient Medications    Scheduled Meds: . aspirin EC  81 mg Oral Daily  . carvedilol  12.5 mg Oral BID WC  . hydrALAZINE  25 mg Oral Q8H  . insulin aspart  0-9 Units Subcutaneous TID WC  . latanoprost  1 drop Both Eyes QHS  . rosuvastatin  40 mg Oral QHS   Continuous Infusions:  PRN Meds: hydrALAZINE, HYDROmorphone (DILAUDID) injection, nitroGLYCERIN, ondansetron **OR** ondansetron (ZOFRAN) IV, traMADol-acetaminophen   Vital Signs    Vitals:   02/04/18 1449 02/04/18 1616 02/04/18 2151 02/05/18 0517  BP: (!) 129/55 (!) 136/58 (!) 117/51 (!) 141/52  Pulse:  67 (!) 56 63  Resp:   17 17  Temp:   98.4 F (36.9 C) 98.5 F (36.9 C)  TempSrc:   Oral   SpO2:   98% 99%  Weight:      Height:        Intake/Output Summary (Last 24 hours) at 02/05/2018 0953 Last data filed at 02/05/2018 0805 Gross per 24 hour  Intake 1497 ml  Output -  Net 1497 ml   Filed Weights   02/02/18 1941  Weight: 159 lb (72.1 kg)    Telemetry    This rhythm with frequent PACs and occasional episodes of Mobitz type I AV block, without significant bradycardia- Personally Reviewed  ECG    No new tracing- Personally Reviewed  Physical Exam  Smiling, comfortable GEN: No acute distress.   Neck: No JVD, lateral carotid endarterectomy scars and carotid bruits Cardiac:  Irregular, no murmurs, rubs, or gallops.  Respiratory: Clear to auscultation bilaterally. GI: Soft, nontender, non-distended  MS: No edema; No deformity. Neuro:  Nonfocal  Psych: Normal affect   Labs    Chemistry Recent Labs  Lab 02/02/18 2024 02/03/18 0355  NA 134* 134*  K 4.8 4.5  CL 99* 100*  CO2 22 23  GLUCOSE 97 124*  BUN 17 18  CREATININE 1.46* 1.52*  CALCIUM 9.9 9.1    GFRNONAA 33* 32*  GFRAA 39* 37*  ANIONGAP 13 11     Hematology Recent Labs  Lab 02/02/18 2024 02/03/18 0355  WBC 5.9 6.4  RBC 4.40 3.94  HGB 10.9* 9.9*  HCT 34.9* 31.3*  MCV 79.3 79.4  MCH 24.8* 25.1*  MCHC 31.2 31.6  RDW 18.6* 19.3*  PLT 272 237    Cardiac Enzymes Recent Labs  Lab 02/03/18 0355 02/03/18 1037 02/03/18 1517  TROPONINI 0.06* 0.07* 0.06*    Recent Labs  Lab 02/02/18 2048  TROPIPOC 0.00     BNPNo results for input(s): BNP, PROBNP in the last 168 hours.   DDimer No results for input(s): DDIMER in the last 168 hours.   Radiology    No results found.  Cardiac Studies   ECHO February 03, 2018  - Left ventricle: The cavity size was normal. Wall thickness was increased in a pattern of moderate LVH. Systolic function was normal. The estimated ejection fraction was in the range of 60% to 65%. Wall motion was normal; there were no regional wall motion abnormalities. Features are consistent with a pseudonormal left ventricular filling pattern, with concomitant abnormal relaxation and increased filling pressure (grade 2 diastolic dysfunction). - Aortic valve: Moderately calcified annulus. - Left atrium: The atrium  was severely dilated. - Tricuspid valve: There was mild-moderate regurgitation. - Pulmonary arteries: Systolic pressure was moderately increased. PA peak pressure: 49 mm Hg (S).  Patient Profile     77 y.o. female with extensive atherosclerotic disease involving the coronary arteries, thoracoabdominal aortic aneurysm, lower extremities, renal arteries, systemic hypertension, chronic kidney disease, history of paroxysmal atrial fibrillation, hyperlipidemia presenting with severe left infrascapular pain, now bilateral. No evidence of acute coronary syndrome by ECG, cardiac enzymes or echo wall motion analysis. CT angiogram of the aorta images reviewed by Dr. Prescott Gum, recommends medical management  Assessment & Plan     1. Thoracic back pain:  Improved with tramadol.  Likely musculoskeletal.  Cannot take NSAIDs and her nephrologist has recommended that she stop taking codeine. 2. Thoracoabdominal aneurysm with extensive mural thrombus:Minimally change in size from previous evaluation, measures 4.3 cm. No evidence of dissection or rupture.  Extensive ulcerative atherosclerosis, but no indication for intervention at this time.  Appreciate evaluation by CV surgery. 3. CAD:  Symptoms have been very different from her previous angina.  Low level plateau increase in troponin is not indicative of acute coronary event. 4. HTN:  Well controlled on current medical regimen.Continue carvedilol and hydralazine.  Avoid RAAS inhibitors 5. CKD: Avoid NSAIDs, RAAS inhibitors, contrast. 6. HLP:On high-dose of a very potent statin.Last LDL was 103 about a month ago: Plan to add ezetimibe or PCS K9inhibitor,to reach target LDL under 70. 7. Microcytic anemia:Likely due to chronic GI iron loss.  Since aspirin seems to be very important to due to her extensive atherosclerotic disease in the thoracic aorta, carotids, renal arteries, coronaries and lower extremities, will discontinue apixaban 8. AFib: None seen on this admission, rhythm is irregular due to frequent PACs and episodes of Mobitz type I AV block.  Hold apixaban.  9. CHF:  She denies dyspnea does not have physical findings to support volume overload.  Echocardiogram shows evidence of elevated filling pressures.  Would avoid diuretic therapy due to the risk to her kidney function, unless she has dyspnea or overt clinical hypervolemia.  Note that her pulmonary artery pressure has actually improved since her previous echocardiogram, suggesting better filling pressures.   We will discharge today.  Stop apixaban.  Give prescription for Ultracet.  Refer to our clinical pharmacists to evaluate options for PC SK 9 inhibitor (preferable if affordable) versus  ezetimibe.  For questions or updates, please contact [place] Please consult [email or web address] for contact info under Cardiology/STEMI.      Signed, [name], MD  02/05/2018, 9:53 AM

## 2018-02-05 NOTE — Discharge Summary (Signed)
Discharge Summary    Patient ID: Kendra Todd,  MRN: [id number], DOB/AGE: 1940/11/08 77 y.o.  Admit date: 02/02/2018 Discharge date: 02/05/2018  Primary Care Provider: [name] Primary Cardiologist: [name], MD  Discharge Diagnoses    Principal Problem:   Thoracic back pain Active Problems:   CAD (coronary artery disease): 1993 PTCA to circumflex.  2002 2 BMS stents to ostial, S/P CABG x 3 12/04/12   HLD (hyperlipidemia)   Pulmonary HTN (HCC)   Atrial fibrillation (HCC)   Thoracoabdominal aortic aneurysm, without rupture (HCC)   Hypertensive urgency   Chest pain   Allergies Allergies  Allergen Reactions  . Iron Hives  . Vicodin [Hydrocodone-Acetaminophen] Other (See Comments)    Caused arrythmia   . Amiodarone Nausea And Vomiting  . Norvasc [Amlodipine Besylate] Nausea And Vomiting  . Promethazine Other (See Comments)    hallucinations  . Vioxx [Rofecoxib] Nausea And Vomiting    Diagnostic Studies/Procedures    Echocardiogram 02/03/18: Study Conclusions  - Left ventricle: The cavity size was normal. Wall thickness was   increased in a pattern of moderate LVH. Systolic function was   normal. The estimated ejection fraction was in the range of 60%   to 65%. Wall motion was normal; there were no regional wall   motion abnormalities. Features are consistent with a pseudonormal   left ventricular filling pattern, with concomitant abnormal   relaxation and increased filling pressure (grade 2 diastolic   dysfunction). - Aortic valve: Moderately calcified annulus. - Left atrium: The atrium was severely dilated. - Tricuspid valve: There was mild-moderate regurgitation. - Pulmonary arteries: Systolic pressure was moderately increased.   PA peak pressure: 49 mm Hg (S). _____________   History of Present Illness     Ms. Kendra Todd began experiencing chest discomfort located in the left infrascapular area and radiating towards the lateral left chest about a week  ago.  The pain became steadily more intense.  It was not associated with physical activity, but was always there.  It became extremely severe over the last 3 days and she eventually had to come to the emergency room.  She obtained relief with Dilaudid, but not with nitroglycerin.  On arrival to the emergency room she was severely hypertensive, now resolved.  When at home she had back pain even when her blood pressure was only 132/60.  She again had pain this morning when her blood pressure was around 130/60.  She did have some improvement in the left lateral chest pain this morning when she received sublingual nitroglycerin, but the back pain is persistent and unchanged.  She does not have any worsening of shortness of breath and denies diaphoresis.  She has had some nausea but no vomiting.  The pain is not positional.  The pain is not associated with cough or deep respiration.  She denies change in her pattern of mild pedal edema, consistently worse on the right than on the left.  She has mild numbness in her right foot ever since her bypass surgery saphenectomy harvest, 5 years ago.  She denies any other focal neurological complaints.  Chest CT angiography shows evidence of a very tortuous moderate-sized thoracoabdominal type II aneurysm, measuring a maximum 4.3 cm.  There is extensive mural hematoma and very irregular plaque.  The radiologist does not think there is evidence of an aortic ulcer/intramural hematoma or dissection.  There is no evidence of contrast extravasation.  Her previous cardiovascular history is quite complex.  Even in the  1990s she had some episodes of coronary angioplasty.  She received a stent to the right coronary artery in 2002 and underwent coronary bypass surgery in January 2014.  She has not required revascularization since then.  She has previous undergone bilateral carotid endarterectomy and has recurrent carotid stenoses (40-59% on the right by ultrasound 2018),  followed by Dr. Oneida Alar.  1 of her endarterectomy procedures was complicated by dissection flap with stroke, thankfully without long-term neurological sequelae.  She has bilateral renal artery stenoses demonstrated at angiography in January 2015 but they were not felt to be severe enough to cause renovascular hypertension.  She has a 50-69% stenosis in the left common iliac artery.  She has had several episodes of paroxysmal atrial fibrillation and has previous undergone electrical cardioversion at least once.  She was intolerant of amiodarone (nausea, anorexia, 20 pound weight loss).  At one point anticoagulants had to be stopped when she developed GI bleeding, but she has been back on Eliquis for about the last 4 months she does have mild microcytic anemia.  Without serious overt bleeding complications.  Hemoglobin 9.9 today.  She has had problems with diastolic heart failure and moderate to severe pulmonary artery hypertension (at 1.2 during heart failure exacerbation estimated systolic PA pressure was 73 mmHg).  She has to adjust her doses of diuretics occasionally.  She has CKD stage III and is followed by Dr. Madelon Lips.  She has severe systemic hypertension and it has been a challenge to identify a system of medications I will control her blood pressure without causing side effects.  Recently her blood pressure has been well controlled, outside of the episode of severe pain that led to this hospitalization.  She has extensive spondylosis in her lumbar spine.    Hospital Course     Consultants: CT Surgery   1. Thoracic backpain:Thought to be 2/2 musculoskeletal pain. Improved with tramadol. Cannot take NSAIDs and her nephrologist has recommended that she stop taking codeine.  2. Thoracoabdominal aneurysm with extensive mural thrombus:Minimally change in size from previous evaluation, measures 4.3 cm. No evidence of dissection or rupture.Extensive ulcerative atherosclerosis.  Evaluated by CT Surgery - no indication for intervention at this time; recommend continue medical management of HLD and HTN.   3. WNU:UVOZDGUYQI symptoms have been very different from her previous angina. EKG non-ischemic. Low level plateau increase in troponin is not indicative of acute coronary event - troponin trend 0.06>0.07>0.06.   4. HKV:QQVZDGLOV presented with hypertensive urgency, which improved with improvement in pain control. Well controlled on current medical regimen.Continued carvedilol and hydralazine.Avoid RAAS inhibitors  5. CKD:Cr stable at discharge - 1.52. Avoid NSAIDs, RAAS inhibitors, contrast.  6. HLD:On high-dose of a very potent statin.Last LDL was 103 about a month FIE:PPIR toadd ezetimibe or PCS K9inhibitor,to reach target LDL under 70.  7. Microcytic anemia:Likely due to chronic GI iron loss.Hgb 9.9 at discharge.Since aspirin seems to be very important due to her extensive atherosclerotic disease in the thoracic aorta, carotids, renal arteries, coronaries, and lower extremities, will discontinue apixaban  8. AFib:None seen on this admission, rhythm is irregular due to frequent PACs and episodes of Mobitz type I AV block.Apixaban discontinued.   9. CHF: Without dyspnea and no physical exam findings to support volume overload. Echocardiogram shows evidence of elevated filling pressures. Would avoid diuretic therapy due to the risk to her kidney function, unless she has dyspnea or overt clinical hypervolemia. Note that her pulmonary artery pressure has actually improved since her previous echocardiogram, suggesting  better filling pressures.  _____________  Discharge Vitals Blood pressure (!) 141/52, pulse 63, temperature 98.5 F (36.9 C), resp. rate 17, height 5' 2.5" (1.588 m), weight 159 lb (72.1 kg), SpO2 99 %.  Filed Weights   02/02/18 1941  Weight: 159 lb (72.1 kg)    Labs & Radiologic Studies    CBC Recent Labs     02/02/18 2024 02/03/18 0355  WBC 5.9 6.4  HGB 10.9* 9.9*  HCT 34.9* 31.3*  MCV 79.3 79.4  PLT 272 671   Basic Metabolic Panel Recent Labs    02/02/18 2024 02/03/18 0355  NA 134* 134*  K 4.8 4.5  CL 99* 100*  CO2 22 23  GLUCOSE 97 124*  BUN 17 18  CREATININE 1.46* 1.52*  CALCIUM 9.9 9.1   Liver Function Tests No results for input(s): AST, ALT, ALKPHOS, BILITOT, PROT, ALBUMIN in the last 72 hours. No results for input(s): LIPASE, AMYLASE in the last 72 hours. Cardiac Enzymes Recent Labs    02/03/18 0355 02/03/18 1037 02/03/18 1517  TROPONINI 0.06* 0.07* 0.06*   BNP Invalid input(s): POCBNP D-Dimer No results for input(s): DDIMER in the last 72 hours. Hemoglobin A1C No results for input(s): HGBA1C in the last 72 hours. Fasting Lipid Panel No results for input(s): CHOL, HDL, LDLCALC, TRIG, CHOLHDL, LDLDIRECT in the last 72 hours. Thyroid Function Tests No results for input(s): TSH, T4TOTAL, T3FREE, THYROIDAB in the last 72 hours.  Invalid input(s): FREET3 _____________  Dg Chest 2 View  Result Date: 02/02/2018 CLINICAL DATA:  Chest pain. EXAM: CHEST - 2 VIEW COMPARISON:  Radiographs of February 18, 2013. FINDINGS: Status post coronary bypass graft. Increased aneurysmal dilatation of descending thoracic aorta is noted. No pneumothorax or pleural effusion is noted. Both lungs are clear. The visualized skeletal structures are unremarkable. IMPRESSION: Increased aneurysmal dilatation of descending thoracic aorta is noted; CT angiography of the chest is recommended for further evaluation. No other significant abnormality seen in the chest. Electronically Signed   By: Marijo Conception, M.D.   On: 02/02/2018 20:49   Ct Angio Chest/abd/pel For Dissection W And/or W/wo  Result Date: 02/02/2018 CLINICAL DATA:  Back pain.  Nausea. EXAM: CT ANGIOGRAPHY CHEST, ABDOMEN AND PELVIS TECHNIQUE: Multidetector CT imaging through the chest, abdomen and pelvis was performed using the standard  protocol during bolus administration of intravenous contrast. Multiplanar reconstructed images and MIPs were obtained and reviewed to evaluate the vascular anatomy. CONTRAST:  123mL ISOVUE-370 IOPAMIDOL (ISOVUE-370) INJECTION 76% COMPARISON:  01/19/2016 CT angiogram of chest, abdomen and pelvis. FINDINGS: CTA CHEST FINDINGS Cardiovascular: Mild cardiomegaly, stable. No significant pericardial fluid/thickening. Three-vessel coronary atherosclerosis status post CABG. Severely atherosclerotic thoracic aorta with extensive ulcerated mural thrombus. Patent aortic arch vessels. Diffuse aneurysmal dilatation of the descending thoracic aorta, maximum diameter 4.3 cm, stable. No evidence of acute intramural hematoma, dissection, pseudoaneurysm or penetrating atherosclerotic ulcer in the thoracic aorta. Top-normal caliber pulmonary arteries (main pulmonary artery diameter 3.0 cm). No central pulmonary emboli. Mediastinum/Nodes: No discrete thyroid nodules. Mildly patulous thoracic esophagus. No pathologically enlarged axillary, mediastinal or hilar lymph nodes. Lungs/Pleura: No pneumothorax. No pleural effusion. Mild compressive atelectasis in the medial left lower lobe. Stable tiny calcified medial apical right upper lobe 3 mm granuloma. No acute consolidative airspace disease, lung masses or significant pulmonary nodules. Musculoskeletal: No aggressive appearing focal osseous lesions. Intact sternotomy wires. Mild thoracic spondylosis. Review of the MIP images confirms the above findings. CTA ABDOMEN AND PELVIS FINDINGS VASCULAR Aorta: Severely atherosclerotic abdominal aorta with extensively  ulcerated mural thrombus. There is irregular aneurysmal dilatation of the entire abdominal aorta with maximum diameter 4.2 cm in the suprarenal abdominal aorta and 3.6 cm in the infrarenal abdominal aorta, unchanged. No dissection, penetrating atherosclerotic ulcer, pseudoaneurysm, vasculitis or significant stenosis. Celiac: Moderate  ostial stenosis, approximately 50%. No dissection, aneurysm or vasculitis. SMA: High-grade ostial stenosis, proximally 70-80%. No dissection, aneurysm or vasculitis. Renals: Single renal arteries bilaterally. Probable high-grade ostial stenosis in the renal arteries bilaterally. No aneurysm, dissection or vasculitis. IMA: Patent without evidence of aneurysm, dissection, vasculitis or significant stenosis. Inflow: High-grade stenosis in the proximal left external iliac artery, approximately 70-80%. Multifocal moderate stenoses in the bilateral common iliac arteries. Veins: Contrast reflux into the IVC and hepatic veins. Review of the MIP images confirms the above findings. NON-VASCULAR Hepatobiliary: Liver surface appears finely irregular, cannot exclude cirrhosis. No liver mass. Normal gallbladder with no radiopaque cholelithiasis. No biliary ductal dilatation. Pancreas: Normal, with no mass or duct dilation. Spleen: Normal size. No mass. Adrenals/Urinary Tract: Normal adrenals. No hydronephrosis. No renal masses. Asymmetric mild to moderate left renal atrophy. Normal bladder. Stomach/Bowel: Normal non-distended stomach. Normal caliber small bowel with no small bowel wall thickening. Appendectomy. Normal large bowel with no diverticulosis, large bowel wall thickening or pericolonic fat stranding. Vascular/Lymphatic: See above for vascular findings. No pathologically enlarged lymph nodes in the abdomen or pelvis. Reproductive: Status post hysterectomy, with no abnormal findings at the vaginal cuff. No adnexal mass. Other: No pneumoperitoneum, ascites or focal fluid collection. Musculoskeletal: No aggressive appearing focal osseous lesions. Marked lower lumbar spondylosis. Review of the MIP images confirms the above findings. IMPRESSION: 1. No acute aortic syndrome. Severely atherosclerotic aorta with extensively ulcerated mural thrombus. Aortic Atherosclerosis (ICD10-I70.0). 2. Diffuse aneurysmal dilatation of the  descending and abdominal aorta, stable since 2017 CT. Maximum descending aortic diameter 4.3 cm. Maximum abdominal aortic diameter 4.2 cm suprarenal and 3.6 cm infrarenal. 3. Multifocal significant atherosclerotic stenoses within the abdominal aortic branch vessels as detailed. 4. Cardiomegaly. Contrast reflux into the IVC and hepatic veins, suggesting right heart failure. 5. No active pulmonary disease. 6. Finely irregular liver surface, suggestive of cirrhosis. 7. Asymmetric left renal atrophy. Electronically Signed   By: Ilona Sorrel M.D.   On: 02/02/2018 23:56   Disposition   Patient was seen and examined by Dr. [name] who deemed patient as stable for discharge. Follow-up has been arranged. Discharge medications as listed below.   Follow-up Plans & Appointments    Follow-up Information    Barrett, Evelene Croon, PA-C Follow up on 03/06/2018.   Specialties:  Cardiology, Radiology Why:  Please arrive 15 minutes early for your 10:00am appointment Contact information: [address] [place] [address] [facility] Follow up on 02/18/2018.   Why:  Please arrive 15 minutes for your 1:30pm Lipid clinic appointment. You will discuss options for additional medications to better control your cholesterol.  Contact information: [facility] [address] [place] [address]         Discharge Instructions    Diet - low sodium heart healthy   Complete by:  As directed    Increase activity slowly   Complete by:  As directed       Discharge Medications   Allergies as of 02/05/2018      Reactions   Iron Hives   Vicodin [hydrocodone-acetaminophen] Other (See Comments)   Caused arrythmia    Amiodarone Nausea And Vomiting   Norvasc [  amlodipine Besylate] Nausea And Vomiting   Promethazine Other (See Comments)   hallucinations   Vioxx [rofecoxib] Nausea And Vomiting      Medication List    STOP taking these medications     apixaban 5 MG Tabs tablet Commonly known as:  ELIQUIS     TAKE these medications   alendronate 70 MG tablet Commonly known as:  FOSAMAX Take 70 mg by mouth once a week.   aspirin 81 MG tablet Take 81 mg by mouth daily.   carvedilol 25 MG tablet Commonly known as:  COREG Take 0.5 tablets (12.5 mg total) by mouth 2 (two) times daily with a meal. NEED OV.   hydrALAZINE 25 MG tablet Commonly known as:  APRESOLINE Take 1 tablet (25 mg total) by mouth 3 (three) times daily.   LUMIGAN 0.01 % Soln Generic drug:  bimatoprost Place 1 drop into both eyes at bedtime.   metFORMIN 1000 MG tablet Commonly known as:  GLUCOPHAGE TAKE 1 TABLET TWICE A DAY WITH MEALS What changed:    how much to take  how to take this  when to take this   nitroGLYCERIN 0.4 MG SL tablet Commonly known as:  NITROSTAT Place 1 tablet (0.4 mg total) under the tongue every 5 (five) minutes as needed for chest pain.   rosuvastatin 40 MG tablet Commonly known as:  CRESTOR Take one tablet by mouth once daily for cholesterol What changed:    how much to take  how to take this  when to take this  additional instructions   traMADol-acetaminophen 37.5-325 MG tablet Commonly known as:  ULTRACET Take 1 tablet by mouth every 6 (six) hours as needed for up to 5 days for moderate pain or severe pain.      Outstanding Labs/Studies   None  Duration of Discharge Encounter   Greater than 30 minutes including physician time.  Signed, Abigail Butts PA-C 02/05/2018, 11:40 AM

## 2018-02-05 NOTE — Consult Note (Signed)
            [facility] Primary Care Navigator  02/05/2018  Kendra Todd Apr 24, 1941 [id number]   Went to seepatient at the bedside to identify possible discharge needsbutshe was already dischargedhometoday perstaffreport.  Patient was brought in for extreme back pain and was severely hypertensive- which improved with improvement in pain control.  Primary care provider's officeis listed as providingtransition of care (TOC)follow-up.    For additional questions please contact:  Edwena Felty A. Amarilis Belflower, BSN, RN-BC [facility] PRIMARY CARE Navigator Cell: [phone]

## 2018-02-05 NOTE — Progress Notes (Signed)
Kendra Todd to be D/C'd home per MD order. Discussed with the patient and all questions fully answered. VVS, Skin clean, dry and intact without evidence of skin break down, no evidence of skin tears noted.  IV catheter discontinued intact. Site without signs and symptoms of complications. Dressing and pressure applied.  An After Visit Summary was printed and given to the patient.  Patient escorted via [place], and D/C home via private auto.  Kendra Todd  02/05/2018 2:19 PM

## 2018-02-06 ENCOUNTER — Telehealth: Payer: Self-pay

## 2018-02-06 NOTE — Telephone Encounter (Signed)
Transition Care Management Follow-Up Telephone Call   Date discharged and where: [facility] on 02/05/2018  How have you been since you were released from the hospital? No pain since being home-Tramadol has not been needed yet. No complaints of weakness and she feels that she will be back to her normal energy level soon.  Any patient concerns? None  Items Reviewed:   Meds: Y, not taking eliquis anymore  Allergies: Y  Dietary Changes Reviewed: Y  Functional Questionnaire:  Independent-I Dependent-D  ADLs:   Dressing- I    Eating- I   Maintaining continence- I   Transferring- I w/ assist of cane for long distances   Transportation- I   Meal Prep- I   Managing Meds- I  Confirmed importance and Date/Time of follow-up visits scheduled:Yes, Dr. Mariea Clonts 02/13/2018 @ 8am   Confirmed with patient if condition worsens to call PCP or go to the Emergency Dept. Patient was given office number and encouraged to call back with questions or concerns: Yes

## 2018-02-07 ENCOUNTER — Ambulatory Visit: Payer: Medicare Other | Admitting: Physician Assistant

## 2018-02-13 ENCOUNTER — Encounter: Payer: Self-pay | Admitting: Internal Medicine

## 2018-02-13 ENCOUNTER — Ambulatory Visit (INDEPENDENT_AMBULATORY_CARE_PROVIDER_SITE_OTHER): Payer: Medicare Other | Admitting: Internal Medicine

## 2018-02-13 VITALS — BP 130/70 | HR 61 | Temp 98.4°F | Ht 62.0 in | Wt 160.6 lb

## 2018-02-13 DIAGNOSIS — I7410 Embolism and thrombosis of unspecified parts of aorta: Secondary | ICD-10-CM | POA: Diagnosis not present

## 2018-02-13 DIAGNOSIS — I480 Paroxysmal atrial fibrillation: Secondary | ICD-10-CM | POA: Diagnosis not present

## 2018-02-13 DIAGNOSIS — N183 Chronic kidney disease, stage 3 unspecified: Secondary | ICD-10-CM

## 2018-02-13 DIAGNOSIS — I10 Essential (primary) hypertension: Secondary | ICD-10-CM

## 2018-02-13 DIAGNOSIS — M546 Pain in thoracic spine: Secondary | ICD-10-CM

## 2018-02-13 DIAGNOSIS — I2510 Atherosclerotic heart disease of native coronary artery without angina pectoris: Secondary | ICD-10-CM | POA: Diagnosis not present

## 2018-02-13 DIAGNOSIS — I716 Thoracoabdominal aortic aneurysm, without rupture, unspecified: Secondary | ICD-10-CM

## 2018-02-13 DIAGNOSIS — R630 Anorexia: Secondary | ICD-10-CM | POA: Diagnosis not present

## 2018-02-13 NOTE — Progress Notes (Signed)
Location:  [facility] Provider: Lisandra Mathisen L. Mariea Clonts, D.O., C.M.D.  Code Status: full code Goals of Care:  Advanced Directives 02/13/2018  Does Patient Have a Medical Advance Directive? No  Type of Advance Directive -  Does patient want to make changes to medical advance directive? No - Patient declined  Copy of [place] in Chart? -  Would patient like information on creating a medical advance directive? No - Patient declined  Pre-existing out of facility DNR order (yellow form or pink MOST form) -  has living will and hcpoa at home, but we don't have it on file--requested she bring a copy in her instructions today   Chief Complaint  Patient presents with  . Transitions Of Care    TOC D/C 02/05/18; patient stated she feeling better but having slight pain under Left shoulder blade and upper back  . ACP    none    HPI: Patient is a 77 y.o. female with h/o CAD with prior angioplasty in the 1990s, stent to RCA in 2002, CABG in 1/14, previous bilateral CEA with recurrent stenoses followed by Dr. Oneida Alar, left common iliac 50-69% stenosis, paroxysmal afib diastolic chf, pulmonary htn, CKD 3 followed by Dr. Hollie Salk and lumbar spondylosis seen today for [facility] visit s/p admission from 3/31-02/05/18 with thoracic back pain of 3 days' duration in the left scapula radiating to lateral left chest relieved with dilaudid but not ntg.  HTN was severe, but improved to 130s/60s during stay.  Chest CTA revealed a very tortuous moderate-sized thoracoabdominal type 2 aneurysm with max size of 4.3 cm with an extensive mural hematoma and irregular plaque.  Radiology did not see ulceration or dissection or any contrast extravasation.    Pain is improved.  tramadol--she used it only for 5 days--it controlled her pain.  She cannot take nsaids due to her ckd3.  It remains tender in that area.  Still hurts here and there.  She reports no falls, no lifting or moving furniture.  Children don't let her do those  things.    BP here today is 130/70 which is excellent for her.    Appetite is not as good.  Certain things make her nauseous in the mornings like meats.  Stomach is a little queasy.  Eating little small meals during the day.  Does not have a ton of energy either.  Has been coming on for a while not just since hospitalization.   Is drinking ensure or boost if food won't stay down.  Eats a lot of fruit.  Weight is stable overall looking at trend.  Discussed importance of having living will and hcpoa on file.  She will have her daughter copy it and bring it to [place].    Past Medical History:  Diagnosis Date  . Abdominal pain, epigastric   . Acute upper respiratory infections of unspecified site   . Arthritis   . Atrial fibrillation ([place]) 02/21/2011  . Benign paroxysmal positional vertigo   . CAD (coronary artery disease)    prior coronary stenting  . Candidiasis of vulva and vagina   . Carotid artery disease ([place]) 2001   s/p Bilateral CEA, after CVA  . Cervicalgia   . Decreased pedal pulses 06/04/2011   doppler - bilateral ABIs no evidence of insufficiency; L CIA slightly elevated velocities 20-30% diameter reduction;   . Dyspnea 03/13/2006   Myoview - EF 76%; mild ischemia in apical lateral region, less severe than previous study in 2002  . Edema   .  H/O carotid endarterectomy 03/06/2012   CT angio -high grade stenosis of proximal R internal carotid w/ lg posterior ulceration or dissection flap; near occlusive stenosis at origin of nondominantproximal L vertebral artery; <50% stenosis of proximal R vertebral and proximal L subclavian artery  . H/O endarterectomy 09/11/2012   patent L and R carotid sites, R 60-79% restenosed ICA; L <40% restenosed ICA  . H/O endarterectomy 01/08/2012   doppler - R distal common carotid/proximal ICA stenosed 80-99%, L 0-39%  . HTN (hypertension) 11/27/2012  . Hyperlipidemia   . Hypertension   . Limb pain 05/02/2011   doppler - no evidence of thrombus of  thrombophlebitis  . Long term (current) use of anticoagulants   . Loss of weight   . Lumbago   . Myalgia and myositis, unspecified   . Myocardial infarction ([place])   . Nonspecific abnormal results of liver function study   . Nontoxic uninodular goiter   . Osteoarthrosis, unspecified whether generalized or localized, unspecified site   . Other malaise and fatigue   . Other manifestations of vitamin A deficiency   . Pain in joint, pelvic region and thigh   . Pain in limb   . Peripheral vascular disease ([place])   . Phlebitis and thrombophlebitis of superficial vessels of lower extremities   . Primary localized osteoarthrosis, hand   . Pulmonary HTN ([place]) 12/03/2012  . Renal artery stenosis ([place])   . Routine gynecological examination   . Spinal stenosis, unspecified region other than cervical   . Stenosis of artery ([place]) 06/03/2012   doppler- most distal aspect of abd aorta no aneurysmal dilation; bilateral ABIs - mild L arterial insufficiency, L CIA narrowing w/ 50-69% diameter reduction; L and R SFA both w/ 0-49% diameter reductions  . Stroke ([place]) 2001  . Superficial vein thrombosis 05/11/2015   Left forehead  . Tobacco use disorder   . TR (tricuspid regurgitation), severe 12/02/12 for repair with CABG 12/03/2012  . Type II or unspecified type diabetes mellitus with peripheral circulatory disorders, not stated as uncontrolled(250.70)   . Unspecified cataract   . Unspecified disorder of iris and ciliary body   . Unspecified vitamin D deficiency   . Urinary tract infection, site not specified     Past Surgical History:  Procedure Laterality Date  . ABDOMINAL HYSTERECTOMY  1975  . ANGIOPLASTY  1984 and 1982  . APPENDECTOMY  1954  . ARCH AORTOGRAM  03/30/2011   40% recurrent stenosis at origin of R proximal carotid patch, shelf like recurrent stenosis w/in midsection of patch that does not appear to be flow limiting; normal non-stenosed great vessel origins  . BACK SURGERY  '84,'86, '87    three previous surgeries  . CAROTID ENDARTERECTOMY  2001   Bilateral  . CORONARY ARTERY BYPASS GRAFT  12/04/2012   Procedure: CORONARY ARTERY BYPASS GRAFTING (CABG);  Surgeon: Ivin Poot, MD; LIMA-LAD, SVG-OM, SVG-RCA  . INTRAOPERATIVE TRANSESOPHAGEAL ECHOCARDIOGRAM  12/04/2012   Procedure: INTRAOPERATIVE TRANSESOPHAGEAL ECHOCARDIOGRAM;  Surgeon: Ivin Poot, MD;  Location: [facility];  Service: Open Heart Surgery;  Laterality: N/A;  . KIDNEY STONE SURGERY     Removal  . LEFT HEART CATHETERIZATION WITH CORONARY ANGIOGRAM N/A 11/28/2012   Procedure: LEFT HEART CATHETERIZATION WITH CORONARY ANGIOGRAM;  Surgeon: Leonie Man, MD;  [facility] C[facility] LAB;  Service: Cardiovascular;  Laterality: N/A;  . PERCUTANEOUS CORONARY STENT INTERVENTION (PCI-S)  2002   2 BMS in ostial/Prox & mid RCA (mid 2.5 mm 20x  mm, & 2.75 mm  x  8 mm ostial)  . RENAL ANGIOGRAM N/A 11/30/2013   Procedure: RENAL ANGIOGRAM;  Surgeon: Lorretta Harp, MD;  Location: [facility] CATH LAB;  Service: Cardiovascular;  Laterality: N/A;  . RIGHT HEART CATHETERIZATION N/A 12/02/2012   Procedure: RIGHT HEART CATH;  Surgeon: Troy Sine, MD;  Location: [facility] CATH LAB;  Service: Cardiovascular;  Laterality: N/A;  . TEE WITH CARDIOVERSION  01/23/2011   EF 55-73%; grade 2 diastolic dysfunction, elevated mean LA filling pressure, RV systolic pressure increased consistent w/ mod pulmonary hypertension    Allergies  Allergen Reactions  . Iron Hives  . Vicodin [Hydrocodone-Acetaminophen] Other (See Comments)    Caused arrythmia   . Amiodarone Nausea And Vomiting  . Norvasc [Amlodipine Besylate] Nausea And Vomiting  . Promethazine Other (See Comments)    hallucinations  . Vioxx [Rofecoxib] Nausea And Vomiting    Outpatient Encounter Medications as of 02/13/2018  Medication Sig  . alendronate (FOSAMAX) 70 MG tablet Take 70 mg by mouth once a week.   [name] aspirin 81 MG tablet Take 81 mg by mouth daily.   . carvedilol (COREG) 25 MG tablet Take  0.5 tablets (12.5 mg total) by mouth 2 (two) times daily with a meal. NEED OV.  . hydrALAZINE (APRESOLINE) 25 MG tablet Take 1 tablet (25 mg total) by mouth 3 (three) times daily.  [name] LUMIGAN 0.01 % SOLN Place 1 drop into both eyes at bedtime.   . metFORMIN (GLUCOPHAGE) 1000 MG tablet TAKE 1 TABLET TWICE A DAY WITH MEALS (Patient taking differently: TAKE 1000 mg  TABLET TWICE A DAY WITH MEALS)  . nitroGLYCERIN (NITROSTAT) 0.4 MG SL tablet Place 1 tablet (0.4 mg total) under the tongue every 5 (five) minutes as needed for chest pain.  . rosuvastatin (CRESTOR) 40 MG tablet Take one tablet by mouth once daily for cholesterol (Patient taking differently: Take 40 mg by mouth at bedtime. )   No facility-administered encounter medications on file as of 02/13/2018.   Medications were reconciled today preadmission with hospital discharge list  Review of Systems:  Review of Systems  Constitutional: Positive for malaise/fatigue. Negative for chills and fever.  HENT: Negative for congestion.   Eyes: Negative for blurred vision.  Respiratory: Negative for cough and shortness of breath.   Cardiovascular: Negative for chest pain, palpitations and leg swelling.  Gastrointestinal: Positive for nausea. Negative for abdominal pain, blood in stool, constipation, diarrhea, heartburn, melena and vomiting.       Decreased appetite in ams lately  Genitourinary: Negative for dysuria.  Musculoskeletal: Positive for back pain and myalgias. Negative for falls, joint pain and neck pain.       Neck not bothering her now  Neurological: Negative for dizziness.  Endo/Heme/Allergies: Does not bruise/bleed easily.  Psychiatric/Behavioral: Negative for depression and memory loss. The patient is not nervous/anxious.     Health Maintenance  Topic Date Due  . URINE MICROALBUMIN  04/15/2018  . HEMOGLOBIN A1C  06/23/2018  . OPHTHALMOLOGY EXAM  10/03/2018  . FOOT EXAM  01/24/2019  . TETANUS/TDAP  11/13/2021  . DEXA SCAN   Completed  . PNA vac Low Risk Adult  Completed  . INFLUENZA VACCINE  Discontinued    Physical Exam: Vitals:   02/13/18 0807  BP: 130/70  Pulse: 61  Temp: 98.4 F (36.9 C)  TempSrc: Oral  SpO2: 99%  Weight: 160 lb 9.6 oz (72.8 kg)  Height: 5\' 2"  (1.575 m)   Body mass index is 29.37 kg/m. Physical Exam  Constitutional:  She is oriented to person, place, and time. She appears well-developed and well-nourished.  HENT:  Head: Normocephalic and atraumatic.  Cardiovascular: Normal rate, regular rhythm, normal heart sounds and intact distal pulses.  Pulmonary/Chest: Effort normal and breath sounds normal. No respiratory distress.  Abdominal: Soft. Bowel sounds are normal.  Musculoskeletal: Normal range of motion. She exhibits tenderness.  Around left scapula in thoracic region   Neurological: She is alert and oriented to person, place, and time.  Skin: Skin is warm and dry. Capillary refill takes less than 2 seconds.  Psychiatric: She has a normal mood and affect.    Labs reviewed: Basic Metabolic Panel: Recent Labs    12/24/17 0824 02/02/18 2024 02/03/18 0355  NA 135 134* 134*  K 4.7 4.8 4.5  CL 100 99* 100*  CO2 28 22 23   GLUCOSE 108* 97 124*  BUN 28* 17 18  CREATININE 1.54* 1.46* 1.52*  CALCIUM 9.2 9.9 9.1   Liver Function Tests: Recent Labs    04/15/17 0906 12/24/17 0824  AST 14 16  ALT 6 7  ALKPHOS 38  --   BILITOT 0.6 0.5  PROT 6.7 6.5  ALBUMIN 3.6  --    No results for input(s): LIPASE, AMYLASE in the last 8760 hours. No results for input(s): AMMONIA in the last 8760 hours. CBC: Recent Labs    12/24/17 0824 02/02/18 2024 02/03/18 0355  WBC 5.2 5.9 6.4  NEUTROABS 3,120  --   --   HGB 10.4* 10.9* 9.9*  HCT 32.8* 34.9* 31.3*  MCV 78.5* 79.3 79.4  PLT 184 272 237   Lipid Panel: Recent Labs    04/15/17 0906 12/24/17 0824  CHOL 164 171  HDL 55 51  LDLCALC 95 103*  TRIG 68 81  CHOLHDL 3.0 3.4   Lab Results  Component Value Date   HGBA1C  6.4 (H) 12/24/2017    Procedures since last visit: Dg Chest 2 View  Result Date: 02/02/2018 CLINICAL DATA:  Chest pain. EXAM: CHEST - 2 VIEW COMPARISON:  Radiographs of February 18, 2013. FINDINGS: Status post coronary bypass graft. Increased aneurysmal dilatation of descending thoracic aorta is noted. No pneumothorax or pleural effusion is noted. Both lungs are clear. The visualized skeletal structures are unremarkable. IMPRESSION: Increased aneurysmal dilatation of descending thoracic aorta is noted; CT angiography of the chest is recommended for further evaluation. No other significant abnormality seen in the chest. Electronically Signed   By: Marijo Conception, M.D.   On: 02/02/2018 20:49   Ct Angio Chest/abd/pel For Dissection W And/or W/wo  Result Date: 02/02/2018 CLINICAL DATA:  Back pain.  Nausea. EXAM: CT ANGIOGRAPHY CHEST, ABDOMEN AND PELVIS TECHNIQUE: Multidetector CT imaging through the chest, abdomen and pelvis was performed using the standard protocol during bolus administration of intravenous contrast. Multiplanar reconstructed images and MIPs were obtained and reviewed to evaluate the vascular anatomy. CONTRAST:  159mL ISOVUE-370 IOPAMIDOL (ISOVUE-370) INJECTION 76% COMPARISON:  01/19/2016 CT angiogram of chest, abdomen and pelvis. FINDINGS: CTA CHEST FINDINGS Cardiovascular: Mild cardiomegaly, stable. No significant pericardial fluid/thickening. Three-vessel coronary atherosclerosis status post CABG. Severely atherosclerotic thoracic aorta with extensive ulcerated mural thrombus. Patent aortic arch vessels. Diffuse aneurysmal dilatation of the descending thoracic aorta, maximum diameter 4.3 cm, stable. No evidence of acute intramural hematoma, dissection, pseudoaneurysm or penetrating atherosclerotic ulcer in the thoracic aorta. Top-normal caliber pulmonary arteries (main pulmonary artery diameter 3.0 cm). No central pulmonary emboli. Mediastinum/Nodes: No discrete thyroid nodules. Mildly  patulous thoracic esophagus. No pathologically enlarged axillary, mediastinal or  hilar lymph nodes. Lungs/Pleura: No pneumothorax. No pleural effusion. Mild compressive atelectasis in the medial left lower lobe. Stable tiny calcified medial apical right upper lobe 3 mm granuloma. No acute consolidative airspace disease, lung masses or significant pulmonary nodules. Musculoskeletal: No aggressive appearing focal osseous lesions. Intact sternotomy wires. Mild thoracic spondylosis. Review of the MIP images confirms the above findings. CTA ABDOMEN AND PELVIS FINDINGS VASCULAR Aorta: Severely atherosclerotic abdominal aorta with extensively ulcerated mural thrombus. There is irregular aneurysmal dilatation of the entire abdominal aorta with maximum diameter 4.2 cm in the suprarenal abdominal aorta and 3.6 cm in the infrarenal abdominal aorta, unchanged. No dissection, penetrating atherosclerotic ulcer, pseudoaneurysm, vasculitis or significant stenosis. Celiac: Moderate ostial stenosis, approximately 50%. No dissection, aneurysm or vasculitis. SMA: High-grade ostial stenosis, proximally 70-80%. No dissection, aneurysm or vasculitis. Renals: Single renal arteries bilaterally. Probable high-grade ostial stenosis in the renal arteries bilaterally. No aneurysm, dissection or vasculitis. IMA: Patent without evidence of aneurysm, dissection, vasculitis or significant stenosis. Inflow: High-grade stenosis in the proximal left external iliac artery, approximately 70-80%. Multifocal moderate stenoses in the bilateral common iliac arteries. Veins: Contrast reflux into the IVC and hepatic veins. Review of the MIP images confirms the above findings. NON-VASCULAR Hepatobiliary: Liver surface appears finely irregular, cannot exclude cirrhosis. No liver mass. Normal gallbladder with no radiopaque cholelithiasis. No biliary ductal dilatation. Pancreas: Normal, with no mass or duct dilation. Spleen: Normal size. No mass. Adrenals/Urinary  Tract: Normal adrenals. No hydronephrosis. No renal masses. Asymmetric mild to moderate left renal atrophy. Normal bladder. Stomach/Bowel: Normal non-distended stomach. Normal caliber small bowel with no small bowel wall thickening. Appendectomy. Normal large bowel with no diverticulosis, large bowel wall thickening or pericolonic fat stranding. Vascular/Lymphatic: See above for vascular findings. No pathologically enlarged lymph nodes in the abdomen or pelvis. Reproductive: Status post hysterectomy, with no abnormal findings at the vaginal cuff. No adnexal mass. Other: No pneumoperitoneum, ascites or focal fluid collection. Musculoskeletal: No aggressive appearing focal osseous lesions. Marked lower lumbar spondylosis. Review of the MIP images confirms the above findings. IMPRESSION: 1. No acute aortic syndrome. Severely atherosclerotic aorta with extensively ulcerated mural thrombus. Aortic Atherosclerosis (ICD10-I70.0). 2. Diffuse aneurysmal dilatation of the descending and abdominal aorta, stable since 2017 CT. Maximum descending aortic diameter 4.3 cm. Maximum abdominal aortic diameter 4.2 cm suprarenal and 3.6 cm infrarenal. 3. Multifocal significant atherosclerotic stenoses within the abdominal aortic branch vessels as detailed. 4. Cardiomegaly. Contrast reflux into the IVC and hepatic veins, suggesting right heart failure. 5. No active pulmonary disease. 6. Finely irregular liver surface, suggestive of cirrhosis. 7. Asymmetric left renal atrophy. Electronically Signed   By: Ilona Sorrel M.D.   On: 02/02/2018 23:56    Assessment/Plan 1. Acute left-sided thoracic back pain -seems she's had muscle spasms that may have begun with her neck pain she had a few weeks before and just gotten worse and worse -much better now after dilaudid in hospital, then tramadol for 5 days after hospitalization, now bothering her just occasionally and remains tender -advised to use heat patches and in between, may use  topical aspercreme, icy hot or theragesic for comfort--her daughter can help to put these on for her  2. Essential hypertension -bp finally controlled to a reasonable level after months to years of elevated bps -cont regimen as above  3. Paroxysmal atrial fibrillation (HCC) -seems regular this am on exam -cont asa, coreg, taken back of eliquis   4. Coronary artery disease involving native coronary artery of native heart without angina pectoris -  not felt to be cause of pain this hospitalization -cont secondary prevention with asa, coreg, statin, ntg  5. CKD (chronic kidney disease) stage 3, GFR 30-59 ml/min (HCC) -follows with Dr. Hollie Salk, doing fine  6. Decreased appetite -gradually occurring over past several months, may be due to diabetes and some degree of early satiety and gastroparesis vs the aging process -encouraged frequent small meals which she is already doing and glucerna rather than ensure or boost that she's been doing since she is diabetic  7. Thoracoabdominal aortic aneurysm, without rupture (HCC) -max diameter of descending aorta 4.3cm, abd aorta 4.2 cm suprarenal and 3.6cm infrarenal -has mural thrombus in it and very "raggedy" appearance -cont monitoring with Dr. [name]  8. Aortic mural thrombus (HCC) -noted during imaging in hospital  Labs/tests ordered:  No orders of the defined types were placed in this encounter. keep lab appt as ordered  Next appt:  06/03/2018 for med mgt with labs before  [place]. Darin Arndt, D.O. [facility] [address], [place] [address] Cell Phone (Mon-Fri 8am-5pm):  [phone] On Call:  [phone] & follow prompts after 5pm & weekends Office Phone:  [phone] Office Fax:  [phone]

## 2018-02-13 NOTE — Patient Instructions (Addendum)
Try using over the counter heat patches again for the sore area. At different times, you may use topical over the counter medications to the area like aspercreme, theragesic cream, biofreeze.    Please bring [place] a copy of your living will and health care power of attorney documents.    Try to eat frequent small meals like you've started doing.  Glucerna is a good protein drink when you have diabetes.

## 2018-02-18 ENCOUNTER — Ambulatory Visit (INDEPENDENT_AMBULATORY_CARE_PROVIDER_SITE_OTHER): Payer: Medicare Other | Admitting: Pharmacist

## 2018-02-18 DIAGNOSIS — I7410 Embolism and thrombosis of unspecified parts of aorta: Secondary | ICD-10-CM | POA: Diagnosis not present

## 2018-02-18 DIAGNOSIS — E7800 Pure hypercholesterolemia, unspecified: Secondary | ICD-10-CM | POA: Diagnosis not present

## 2018-02-18 MED ORDER — EZETIMIBE 10 MG PO TABS: 10.0000 mg | ORAL_TABLET | Freq: Every day | ORAL | 3 refills | Status: DC

## 2018-02-18 NOTE — Patient Instructions (Addendum)
[facility] (pharmacist) Kyndel Egger/Kristin  *Add ezetimibe 10mg  daily to Crestor therapy* *Repeat blood work in 6 weeks*   Cholesterol Cholesterol is a fat. Your body needs a small amount of cholesterol. Cholesterol (plaque) may build up in your blood vessels (arteries). That makes you more likely to have a heart attack or stroke. You cannot feel your cholesterol level. Having a blood test is the only way to find out if your level is high. Keep your test results. Work with your doctor to keep your cholesterol at a good level. What do the results mean?  Total cholesterol is how much cholesterol is in your blood.  LDL is bad cholesterol. This is the type that can build up. Try to have low LDL.  HDL is good cholesterol. It cleans your blood vessels and carries LDL away. Try to have high HDL.  Triglycerides are fat that the body can store or burn for energy. What are good levels of cholesterol?  Total cholesterol below 200.  LDL below 100 is good for people who have health risks. LDL below 70 is good for people who have very high risks.  HDL above 40 is good. It is best to have HDL of 60 or higher.  Triglycerides below 150. How can I lower my cholesterol? Diet Follow your diet program as told by your doctor.  Choose fish, white meat chicken, or [place] that is roasted or baked. Try not to eat red meat, fried foods, sausage, or lunch meats.  Eat lots of fresh fruits and vegetables.  Choose whole grains, beans, pasta, potatoes, and cereals.  Choose olive oil, corn oil, or canola oil. Only use small amounts.  Try not to eat butter, mayonnaise, shortening, or palm kernel oils.  Try not to eat foods with trans fats.  Choose low-fat or nonfat dairy foods. ? Drink skim or nonfat milk. ? Eat low-fat or nonfat yogurt and cheeses. ? Try not to drink whole milk or cream. ? Try not to eat ice cream, egg yolks, or full-fat cheeses.  Healthy desserts include angel food cake, ginger  snaps, animal crackers, hard candy, popsicles, and low-fat or nonfat frozen yogurt. Try not to eat pastries, cakes, pies, and cookies.  Exercise Follow your exercise program as told by your doctor.  Be more active. Try gardening, walking, and taking the stairs.  Ask your doctor about ways that you can be more active.  Medicine  Take over-the-counter and prescription medicines only as told by your doctor. This information is not intended to replace advice given to you by your health care provider. Make sure you discuss any questions you have with your health care provider. Document Released: 01/18/2009 Document Revised: 05/23/2016 Document Reviewed: 05/03/2016 Elsevier Interactive Patient Education  [employer].

## 2018-02-18 NOTE — Progress Notes (Signed)
Patient ID: [name]                 DOB: 1940/12/03                    MRN: [id number]     HPI: Kendra Todd is a 77 y.o. female patient of DR Croitoru referred to lipid clinic by Kendra Lofts PA-C. PMH is significant for CAD s/p stent placement in 2002 and CABG x3 on January/2014, hyperlipidemia, hypertension, atrial fibrillation, Pulmonary HTN, and thoracoabdominal aortic aneurysm, without rupture.  Noted patient LDL-c remains above desired goal for secondary prevention while patient high intensity statin. Patient will be a good candidate for PCSK9 inhibitors.  Current Medications:  Rosuvastatin 40mg  daily  Intolerances:  none  LDL goal: < 70 mg/dL  Diet: decreased appetite, good amount of fuits and vegetables, stable wieght  Exercise: activity of daily leaving  Family History:  Heart disease from father; Diabetes from mother  Social History: former smoker; denies alcohol use  Labs: 12/24/17: CHO 171; HDL 51; TG 81; LDL-c 103 (rosuvastatin 40mg  daily)  Past Medical History:  Diagnosis Date  . Abdominal pain, epigastric   . Acute upper respiratory infections of unspecified site   . Arthritis   . Atrial fibrillation (Kendra Todd) 02/21/2011  . Benign paroxysmal positional vertigo   . CAD (coronary artery disease)    prior coronary stenting  . Candidiasis of vulva and vagina   . Carotid artery disease ([place]) 2001   s/p Bilateral CEA, after CVA  . Cervicalgia   . Decreased pedal pulses 06/04/2011   doppler - bilateral ABIs no evidence of insufficiency; L CIA slightly elevated velocities 20-30% diameter reduction;   . Dyspnea 03/13/2006   Myoview - EF 76%; mild ischemia in apical lateral region, less severe than previous study in 2002  . Edema   . H/O carotid endarterectomy 03/06/2012   CT angio -high grade stenosis of proximal R internal carotid w/ lg posterior ulceration or dissection flap; near occlusive stenosis at origin of nondominantproximal L vertebral artery; <50% stenosis of  proximal R vertebral and proximal L subclavian artery  . H/O endarterectomy 09/11/2012   patent L and R carotid sites, R 60-79% restenosed ICA; L <40% restenosed ICA  . H/O endarterectomy 01/08/2012   doppler - R distal common carotid/proximal ICA stenosed 80-99%, L 0-39%  . HTN (hypertension) 11/27/2012  . Hyperlipidemia   . Hypertension   . Limb pain 05/02/2011   doppler - no evidence of thrombus of thrombophlebitis  . Long term (current) use of anticoagulants   . Loss of weight   . Lumbago   . Myalgia and myositis, unspecified   . Myocardial infarction ([place])   . Nonspecific abnormal results of liver function study   . Nontoxic uninodular goiter   . Osteoarthrosis, unspecified whether generalized or localized, unspecified site   . Other malaise and fatigue   . Other manifestations of vitamin A deficiency   . Pain in joint, pelvic region and thigh   . Pain in limb   . Peripheral vascular disease ([place])   . Phlebitis and thrombophlebitis of superficial vessels of lower extremities   . Primary localized osteoarthrosis, hand   . Pulmonary HTN ([place]) 12/03/2012  . Renal artery stenosis ([place])   . Routine gynecological examination   . Spinal stenosis, unspecified region other than cervical   . Stenosis of artery ([place]) 06/03/2012   doppler- most distal aspect of abd aorta no aneurysmal dilation; bilateral ABIs -  mild L arterial insufficiency, L CIA narrowing w/ 50-69% diameter reduction; L and R SFA both w/ 0-49% diameter reductions  . Stroke ([place]) 2001  . Superficial vein thrombosis 05/11/2015   Left forehead  . Tobacco use disorder   . TR (tricuspid regurgitation), severe 12/02/12 for repair with CABG 12/03/2012  . Type II or unspecified type diabetes mellitus with peripheral circulatory disorders, not stated as uncontrolled(250.70)   . Unspecified cataract   . Unspecified disorder of iris and ciliary body   . Unspecified vitamin D deficiency   . Urinary tract infection, site not specified      Current Outpatient Medications on File Prior to Visit  Medication Sig Dispense Refill  . alendronate (FOSAMAX) 70 MG tablet Take 70 mg by mouth once a week.     [name] aspirin 81 MG tablet Take 81 mg by mouth daily.     . carvedilol (COREG) 25 MG tablet Take 0.5 tablets (12.5 mg total) by mouth 2 (two) times daily with a meal. NEED OV. 90 tablet 0  . hydrALAZINE (APRESOLINE) 25 MG tablet Take 1 tablet (25 mg total) by mouth 3 (three) times daily. 90 tablet 3  . LUMIGAN 0.01 % SOLN Place 1 drop into both eyes at bedtime.     . metFORMIN (GLUCOPHAGE) 1000 MG tablet TAKE 1 TABLET TWICE A DAY WITH MEALS 180 tablet 1  . nitroGLYCERIN (NITROSTAT) 0.4 MG SL tablet Place 1 tablet (0.4 mg total) under the tongue every 5 (five) minutes as needed for chest pain. 25 tablet 3  . rosuvastatin (CRESTOR) 40 MG tablet Take one tablet by mouth once daily for cholesterol 90 tablet 3   No current facility-administered medications on file prior to visit.     Allergies  Allergen Reactions  . Iron Hives  . Vicodin [Hydrocodone-Acetaminophen] Other (See Comments)    Caused arrythmia   . Amiodarone Nausea And Vomiting  . Norvasc [Amlodipine Besylate] Nausea And Vomiting  . Promethazine Other (See Comments)    hallucinations  . Vioxx [Rofecoxib] Nausea And Vomiting    HLD (hyperlipidemia) LDL-c remains above desired goal of 70mg /dL while patient on rosuvastatin 40mg  daily. Medication compliance and diet remain appropriate.  [name] refused [id number] due to fear of needles and inability to self-inject. After further discussion she reports never using ezetimibe in the past and is willing to try "an additional pill".  Will start ezetimibe 10mg  daily and continue rosuvastatin 40mg  daily. Plan to repeat fasting blood work in 6 weeks if patient able to tolerate oral therapy.    Brantley Naser Rodriguez-Guzman PharmD, BCPS, [place] [address] 02/19/2018 8:35 AM

## 2018-02-19 ENCOUNTER — Encounter: Payer: Self-pay | Admitting: Pharmacist

## 2018-02-19 NOTE — Assessment & Plan Note (Addendum)
LDL-c remains above desired goal of 70mg /dL while patient on rosuvastatin 40mg  daily. Medication compliance and diet remain appropriate.  Ms Osbourn refused [id number] due to fear of needles and inability to self-inject. After further discussion she reports never using ezetimibe in the past and is willing to try "an additional pill".  Will start ezetimibe 10mg  daily and continue rosuvastatin 40mg  daily. Plan to repeat fasting blood work in 6 weeks if patient able to tolerate oral therapy.

## 2018-02-19 NOTE — Progress Notes (Signed)
Thanks [name] 

## 2018-03-05 ENCOUNTER — Encounter: Payer: Self-pay | Admitting: Nurse Practitioner

## 2018-03-05 ENCOUNTER — Ambulatory Visit (INDEPENDENT_AMBULATORY_CARE_PROVIDER_SITE_OTHER): Payer: Medicare Other | Admitting: Nurse Practitioner

## 2018-03-05 VITALS — BP 144/82 | HR 82 | Temp 98.6°F | Ht 62.0 in | Wt 153.4 lb

## 2018-03-05 DIAGNOSIS — S29011A Strain of muscle and tendon of front wall of thorax, initial encounter: Secondary | ICD-10-CM | POA: Diagnosis not present

## 2018-03-05 DIAGNOSIS — I7410 Embolism and thrombosis of unspecified parts of aorta: Secondary | ICD-10-CM

## 2018-03-05 MED ORDER — PREDNISONE 10 MG (21) PO TBPK
ORAL_TABLET | ORAL | 0 refills | Status: DC
Start: 2018-03-05 — End: 2018-06-23

## 2018-03-05 MED ORDER — TRAMADOL HCL 50 MG PO TABS: 50.0000 mg | ORAL_TABLET | Freq: Three times a day (TID) | ORAL | 0 refills | Status: DC | PRN

## 2018-03-05 NOTE — Progress Notes (Signed)
Careteam: Patient Care Team: [name], DO as PCP - General (Geriatric Medicine) [name], MD as PCP - Cardiology (Cardiology) [name], MD as Attending Physician (Cardiology) Marylynn Pearson, MD as Consulting Physician (Ophthalmology) Madelon Lips, MD as Consulting Physician (Nephrology) Valinda Party, MD (Rheumatology)  Advanced Directive information Does Patient Have a Medical Advance Directive?: Yes, Type of Advance Directive: Living will  Allergies  Allergen Reactions  . Iron Hives  . Vicodin [Hydrocodone-Acetaminophen] Other (See Comments)    Caused arrythmia   . Amiodarone Nausea And Vomiting  . Norvasc [Amlodipine Besylate] Nausea And Vomiting  . Promethazine Other (See Comments)    hallucinations  . Vioxx [Rofecoxib] Nausea And Vomiting    Chief Complaint  Patient presents with  . Acute Visit    Pt is having ongoing back pain that starts mid back at shoulder blades and moves down back to left side. Pt was using heat therapy patches until one actually burned blisters on her left side.      HPI: Patient is a 77 y.o. female seen in the office today due to ongoing back pain.  Saw Dr Mariea Clonts a few weeks ago and was told it was muscular and was put on therapy heating pad which burned her skin so she is not using.  About 5 days ago pain got bad. 8-9/10 all of time. Sharp burning pain that throbs. She was placed on tramadol when she left the hospital which helps the pain. Tylenol is not effective.  Asper-cream has not helped.  When she takes tramadol it will ease pain for 10-12 hours but always comes back. Overall getting worse.  Originally went to hospital 02/02/18 due to back/rib pain and was evaluated by cardiologist which cardiac was ruled out.   Pain has made her unable to be active and limited what she can do.  Unable to take NSAID due to CKD No injury, strain or changes in movement.  Review of Systems:  Review of Systems  Constitutional:  Negative for chills and fever.  Respiratory: Negative for cough, sputum production and shortness of breath.   Cardiovascular: Negative for chest pain and palpitations.  Musculoskeletal: Positive for back pain and myalgias. Negative for falls and joint pain.    Past Medical History:  Diagnosis Date  . Abdominal pain, epigastric   . Acute upper respiratory infections of unspecified site   . Arthritis   . Atrial fibrillation ([place]) 02/21/2011  . Benign paroxysmal positional vertigo   . CAD (coronary artery disease)    prior coronary stenting  . Candidiasis of vulva and vagina   . Carotid artery disease ([place]) 2001   s/p Bilateral CEA, after CVA  . Cervicalgia   . Decreased pedal pulses 06/04/2011   doppler - bilateral ABIs no evidence of insufficiency; L CIA slightly elevated velocities 20-30% diameter reduction;   . Dyspnea 03/13/2006   Myoview - EF 76%; mild ischemia in apical lateral region, less severe than previous study in 2002  . Edema   . H/O carotid endarterectomy 03/06/2012   CT angio -high grade stenosis of proximal R internal carotid w/ lg posterior ulceration or dissection flap; near occlusive stenosis at origin of nondominantproximal L vertebral artery; <50% stenosis of proximal R vertebral and proximal L subclavian artery  . H/O endarterectomy 09/11/2012   patent L and R carotid sites, R 60-79% restenosed ICA; L <40% restenosed ICA  . H/O endarterectomy 01/08/2012   doppler - R distal common carotid/proximal ICA stenosed 80-99%, L  0-39%  . HTN (hypertension) 11/27/2012  . Hyperlipidemia   . Hypertension   . Limb pain 05/02/2011   doppler - no evidence of thrombus of thrombophlebitis  . Long term (current) use of anticoagulants   . Loss of weight   . Lumbago   . Myalgia and myositis, unspecified   . Myocardial infarction ([place])   . Nonspecific abnormal results of liver function study   . Nontoxic uninodular goiter   . Osteoarthrosis, unspecified whether generalized or  localized, unspecified site   . Other malaise and fatigue   . Other manifestations of vitamin A deficiency   . Pain in joint, pelvic region and thigh   . Pain in limb   . Peripheral vascular disease ([place])   . Phlebitis and thrombophlebitis of superficial vessels of lower extremities   . Primary localized osteoarthrosis, hand   . Pulmonary HTN ([place]) 12/03/2012  . Renal artery stenosis ([place])   . Routine gynecological examination   . Spinal stenosis, unspecified region other than cervical   . Stenosis of artery ([place]) 06/03/2012   doppler- most distal aspect of abd aorta no aneurysmal dilation; bilateral ABIs - mild L arterial insufficiency, L CIA narrowing w/ 50-69% diameter reduction; L and R SFA both w/ 0-49% diameter reductions  . Stroke ([place]) 2001  . Superficial vein thrombosis 05/11/2015   Left forehead  . Tobacco use disorder   . TR (tricuspid regurgitation), severe 12/02/12 for repair with CABG 12/03/2012  . Type II or unspecified type diabetes mellitus with peripheral circulatory disorders, not stated as uncontrolled(250.70)   . Unspecified cataract   . Unspecified disorder of iris and ciliary body   . Unspecified vitamin D deficiency   . Urinary tract infection, site not specified    Past Surgical History:  Procedure Laterality Date  . ABDOMINAL HYSTERECTOMY  1975  . ANGIOPLASTY  1984 and 1982  . APPENDECTOMY  1954  . ARCH AORTOGRAM  03/30/2011   40% recurrent stenosis at origin of R proximal carotid patch, shelf like recurrent stenosis w/in midsection of patch that does not appear to be flow limiting; normal non-stenosed great vessel origins  . BACK SURGERY  '84,'86, '87   three previous surgeries  . CAROTID ENDARTERECTOMY  2001   Bilateral  . CORONARY ARTERY BYPASS GRAFT  12/04/2012   Procedure: CORONARY ARTERY BYPASS GRAFTING (CABG);  Surgeon: Ivin Poot, MD; LIMA-LAD, SVG-OM, SVG-RCA  . INTRAOPERATIVE TRANSESOPHAGEAL ECHOCARDIOGRAM  12/04/2012   Procedure: INTRAOPERATIVE  TRANSESOPHAGEAL ECHOCARDIOGRAM;  Surgeon: Ivin Poot, MD;  Location: [facility];  Service: Open Heart Surgery;  Laterality: N/A;  . KIDNEY STONE SURGERY     Removal  . LEFT HEART CATHETERIZATION WITH CORONARY ANGIOGRAM N/A 11/28/2012   Procedure: LEFT HEART CATHETERIZATION WITH CORONARY ANGIOGRAM;  Surgeon: Leonie Man, MD;  Location: [facility] CATH LAB;  Service: Cardiovascular;  Laterality: N/A;  . PERCUTANEOUS CORONARY STENT INTERVENTION (PCI-S)  2002   2 BMS in ostial/Prox & mid RCA (mid 2.5 mm 20x  mm, & 2.75 mm x  8 mm ostial)  . RENAL ANGIOGRAM N/A 11/30/2013   Procedure: RENAL ANGIOGRAM;  Surgeon: Lorretta Harp, MD;  Location: [facility] CATH LAB;  Service: Cardiovascular;  Laterality: N/A;  . RIGHT HEART CATHETERIZATION N/A 12/02/2012   Procedure: RIGHT HEART CATH;  Surgeon: Troy Sine, MD;  Location: [facility] CATH LAB;  Service: Cardiovascular;  Laterality: N/A;  . TEE WITH CARDIOVERSION  01/23/2011   EF 16-10%; grade 2 diastolic dysfunction, elevated mean LA filling pressure,  RV systolic pressure increased consistent w/ mod pulmonary hypertension   Social History:   reports that she quit smoking about 5 years ago. Her smoking use included cigarettes. She has a 17.10 pack-year smoking history. She has never used smokeless tobacco. She reports that she drinks alcohol. She reports that she does not use drugs.  Family History  Problem Relation Age of Onset  . Other Mother        CVA  . Diabetes Mother   . Heart disease Mother        Heart disease before age 96  . Heart attack Mother   . Heart disease Father        Heart Disease before age 39  . Heart attack Father   . Heart disease Sister        Amputation  . Diabetes Sister   . Heart disease Brother        Heart disease before age 18  . Diabetes Brother   . Stroke Brother   . Stroke Brother   . Heart disease Brother   . Stroke Brother   . Heart disease Brother   . Heart disease Sister   . Heart disease Sister   . Diabetes Daughter    . Cancer Sister   . Diabetes Other   . Cancer Other        breast  . Heart disease Other        cad  . Other Other        cva    Medications: Patient's Medications  New Prescriptions   No medications on file  Previous Medications   ALENDRONATE (FOSAMAX) 70 MG TABLET    Take 70 mg by mouth once a week.    ASPIRIN 81 MG TABLET    Take 81 mg by mouth daily.    CARVEDILOL (COREG) 25 MG TABLET    Take 0.5 tablets (12.5 mg total) by mouth 2 (two) times daily with a meal. NEED OV.   EZETIMIBE (ZETIA) 10 MG TABLET    Take 1 tablet (10 mg total) by mouth daily.   HYDRALAZINE (APRESOLINE) 25 MG TABLET    Take 1 tablet (25 mg total) by mouth 3 (three) times daily.   LUMIGAN 0.01 % SOLN    Place 1 drop into both eyes at bedtime.    METFORMIN (GLUCOPHAGE) 1000 MG TABLET    TAKE 1 TABLET TWICE A DAY WITH MEALS   NITROGLYCERIN (NITROSTAT) 0.4 MG SL TABLET    Place 1 tablet (0.4 mg total) under the tongue every 5 (five) minutes as needed for chest pain.   ROSUVASTATIN (CRESTOR) 40 MG TABLET    Take one tablet by mouth once daily for cholesterol  Modified Medications   No medications on file  Discontinued Medications   No medications on file     Physical Exam:  Vitals:   03/05/18 1305  BP: (!) 144/82  Pulse: 82  Temp: 98.6 F (37 C)  TempSrc: Oral  SpO2: 98%  Weight: 153 lb 6.4 oz (69.6 kg)  Height: 5\' 2"  (1.575 m)   Body mass index is 28.06 kg/m.  Physical Exam  Constitutional: She is oriented to person, place, and time. She appears well-developed and well-nourished.  HENT:  Head: Normocephalic and atraumatic.  Cardiovascular: Normal rate, regular rhythm, normal heart sounds and intact distal pulses.  Pulmonary/Chest: Effort normal and breath sounds normal. No respiratory distress.  Abdominal: Soft. Bowel sounds are normal.  Musculoskeletal: Normal range of motion. She exhibits  tenderness.  Tenderness under left scapula along ribs and intercostal area in thoracic region  around chest   Neurological: She is alert and oriented to person, place, and time.  Skin: Skin is warm and dry.  2 small blistered areas from patch, no erythema or drainage noted   Psychiatric: She has a normal mood and affect.    Labs reviewed: Basic Metabolic Panel: Recent Labs    12/24/17 0824 02/02/18 2024 02/03/18 0355  NA 135 134* 134*  K 4.7 4.8 4.5  CL 100 99* 100*  CO2 28 22 23   GLUCOSE 108* 97 124*  BUN 28* 17 18  CREATININE 1.54* 1.46* 1.52*  CALCIUM 9.2 9.9 9.1   Liver Function Tests: Recent Labs    04/15/17 0906 12/24/17 0824  AST 14 16  ALT 6 7  ALKPHOS 38  --   BILITOT 0.6 0.5  PROT 6.7 6.5  ALBUMIN 3.6  --    No results for input(s): LIPASE, AMYLASE in the last 8760 hours. No results for input(s): AMMONIA in the last 8760 hours. CBC: Recent Labs    12/24/17 0824 02/02/18 2024 02/03/18 0355  WBC 5.2 5.9 6.4  NEUTROABS 3,120  --   --   HGB 10.4* 10.9* 9.9*  HCT 32.8* 34.9* 31.3*  MCV 78.5* 79.3 79.4  PLT 184 272 237   Lipid Panel: Recent Labs    04/15/17 0906 12/24/17 0824  CHOL 164 171  HDL 55 51  LDLCALC 95 103*  TRIG 68 81  CHOLHDL 3.0 3.4   TSH: No results for input(s): TSH in the last 8760 hours. A1C: Lab Results  Component Value Date   HGBA1C 6.4 (H) 12/24/2017     Assessment/Plan 1. Intercostal muscle strain, initial encounter -to use ice to effected area -avoid heat when you have any muscle rub on skin and avoid patches due to reaction.  - predniSONE (STERAPRED UNI-PAK 21 TAB) 10 MG (21) TBPK tablet; Use as directed  Dispense: 21 tablet; Refill: 0 - traMADol (ULTRAM) 50 MG tablet; Take 1 tablet (50 mg total) by mouth every 8 (eight) hours as needed.  Dispense: 30 tablet; Refill: 0  Next appt: 06/03/2018, sooner if needed  Janett Billow K. [place], [facility] [phone]

## 2018-03-05 NOTE — Patient Instructions (Signed)
STOP patches May use ice to effected area To use prednisone as directed

## 2018-03-06 ENCOUNTER — Telehealth: Payer: Self-pay

## 2018-03-06 ENCOUNTER — Ambulatory Visit (INDEPENDENT_AMBULATORY_CARE_PROVIDER_SITE_OTHER): Payer: Medicare Other | Admitting: Physician Assistant

## 2018-03-06 ENCOUNTER — Encounter: Payer: Self-pay | Admitting: Physician Assistant

## 2018-03-06 VITALS — BP 138/78 | HR 67 | Ht 62.5 in | Wt 154.0 lb

## 2018-03-06 DIAGNOSIS — I716 Thoracoabdominal aortic aneurysm, without rupture, unspecified: Secondary | ICD-10-CM

## 2018-03-06 DIAGNOSIS — I5032 Chronic diastolic (congestive) heart failure: Secondary | ICD-10-CM | POA: Diagnosis not present

## 2018-03-06 DIAGNOSIS — I2510 Atherosclerotic heart disease of native coronary artery without angina pectoris: Secondary | ICD-10-CM | POA: Diagnosis not present

## 2018-03-06 DIAGNOSIS — I480 Paroxysmal atrial fibrillation: Secondary | ICD-10-CM

## 2018-03-06 DIAGNOSIS — R0781 Pleurodynia: Secondary | ICD-10-CM

## 2018-03-06 DIAGNOSIS — I7410 Embolism and thrombosis of unspecified parts of aorta: Secondary | ICD-10-CM

## 2018-03-06 NOTE — Progress Notes (Signed)
TY [name] 

## 2018-03-06 NOTE — Telephone Encounter (Signed)
Patient called to say that she did not get any instructions on how to take the prednisone dose pack she picked up from the pharmacy yesterday. The instructions stated to "use as directed" only.   Patient will bring medication by the office today for Janett Billow to give her instructions on how to take the prednisone.

## 2018-03-06 NOTE — Patient Instructions (Signed)
Medication Instructions:  Continue current medications  If you need a refill on your cardiac medications before your next appointment, please call your pharmacy.  Labwork: None ordered   Testing/Procedures: None Ordered  Follow-Up: Your physician wants you to follow-up in: 3 Month with Dr [name].     Thank you for choosing [facility] at [facility]!!

## 2018-03-06 NOTE — Progress Notes (Signed)
Cardiology Office Note   Date:  03/06/2018   ID:  Kendra Todd, DOB [birth year], MRN [id number]  PCP:  [name], DO  Cardiologist: Dr. [name] 02/05/2018 in hospital [name], PA-C 09/25/2016  Chief Complaint  Patient presents with  . Hospitalization Follow-up    pt does mention chest discomfort, pt denies swelling in hands/feet, and no SOB    History of Present Illness: Kendra Todd is a 77 y.o. female with a history of CABG x 3 2014, HTN, HLD, PAF (intol amio) on Eliquis, BPPV, bilat CEA, CVA, PAD, RAS, Pulm HTN, D-CHF, anemia, CKD III  Admitted 3/31-02/05/2018 for thoracic back pain, felt musculoskeletal, CT with tortuous thoracoabdominal type II aneurysm, 4.3 cm with extensive mural hematoma and irregular plaque 02/19/2018 visit with Pharmacist, Zetia added  Kendra Todd presents for cardiology follow up.   She has seen her medical doctor a couple of times for the pain. She got a steroid dose pack, will start that today. She takes tramadol prn, that helps the pain.  She has significant chest wall and rib tenderness.  She states she was told that the chest pain is likely coming from inflammation in her cartilage/ribs.  She is hoping to get better.   She is not having lower extremity edema.  She is not having orthopnea or PND.  She has not had symptoms reminiscent of her pre-CABG symptoms.  She has some dyspnea on exertion but that is hard to sort out because it hurts to breathe deeply.   She has had no palpitations and does not think she has had any more atrial fibrillation.   Past Medical History:  Diagnosis Date  . Abdominal pain, epigastric   . Acute upper respiratory infections of unspecified site   . Arthritis   . Benign paroxysmal positional vertigo   . CAD (coronary artery disease)    prior coronary stenting  . Candidiasis of vulva and vagina   . Carotid artery disease ([place]) 2001   s/p Bilateral CEA, after CVA  . Cervicalgia   . Decreased pedal pulses  06/04/2011   doppler - bilateral ABIs no evidence of insufficiency; L CIA slightly elevated velocities 20-30% diameter reduction;   . Dyspnea 03/13/2006   Myoview - EF 76%; mild ischemia in apical lateral region, less severe than previous study in 2002  . Edema   . H/O carotid endarterectomy 03/06/2012   CT angio -high grade stenosis of proximal R internal carotid w/ lg posterior ulceration or dissection flap; near occlusive stenosis at origin of nondominantproximal L vertebral artery; <50% stenosis of proximal R vertebral and proximal L subclavian artery  . H/O endarterectomy 09/11/2012   patent L and R carotid sites, R 60-79% restenosed ICA; L <40% restenosed ICA  . H/O endarterectomy 01/08/2012   doppler - R distal common carotid/proximal ICA stenosed 80-99%, L 0-39%  . HTN (hypertension) 11/27/2012  . Hyperlipidemia   . Limb pain 05/02/2011   doppler - no evidence of thrombus of thrombophlebitis  . Long term (current) use of anticoagulants   . Loss of weight   . Lumbago   . Myalgia and myositis, unspecified   . Myocardial infarction ([place])   . Nonspecific abnormal results of liver function study   . Nontoxic uninodular goiter   . Osteoarthrosis, unspecified whether generalized or localized, unspecified site   . Other malaise and fatigue   . Other manifestations of vitamin A deficiency   . PAF (paroxysmal atrial fibrillation) ([place]) 02/21/2011  .  Pain in joint, pelvic region and thigh   . Pain in limb   . Peripheral vascular disease ([place])   . Phlebitis and thrombophlebitis of superficial vessels of lower extremities   . Primary localized osteoarthrosis, hand   . Pulmonary HTN ([place]) 12/03/2012  . Renal artery stenosis ([place])   . Routine gynecological examination   . Spinal stenosis, unspecified region other than cervical   . Stenosis of artery ([place]) 06/03/2012   doppler- most distal aspect of abd aorta no aneurysmal dilation; bilateral ABIs - mild L arterial insufficiency, L CIA narrowing w/  50-69% diameter reduction; L and R SFA both w/ 0-49% diameter reductions  . Stroke ([place]) 2001  . Superficial vein thrombosis 05/11/2015   Left forehead  . Tobacco use disorder   . TR (tricuspid regurgitation), severe 12/02/12 for repair with CABG 12/03/2012  . Type II or unspecified type diabetes mellitus with peripheral circulatory disorders, not stated as uncontrolled(250.70)   . Unspecified cataract   . Unspecified disorder of iris and ciliary body   . Unspecified vitamin D deficiency   . Urinary tract infection, site not specified     Past Surgical History:  Procedure Laterality Date  . ABDOMINAL HYSTERECTOMY  1975  . ANGIOPLASTY  1984 and 1982  . APPENDECTOMY  1954  . ARCH AORTOGRAM  03/30/2011   40% recurrent stenosis at origin of R proximal carotid patch, shelf like recurrent stenosis w/in midsection of patch that does not appear to be flow limiting; normal non-stenosed great vessel origins  . BACK SURGERY  '84,'86, '87   three previous surgeries  . CAROTID ENDARTERECTOMY  2001   Bilateral  . CORONARY ARTERY BYPASS GRAFT  12/04/2012   Procedure: CORONARY ARTERY BYPASS GRAFTING (CABG);  Surgeon: Ivin Poot, MD; LIMA-LAD, SVG-OM, SVG-RCA  . INTRAOPERATIVE TRANSESOPHAGEAL ECHOCARDIOGRAM  12/04/2012   Procedure: INTRAOPERATIVE TRANSESOPHAGEAL ECHOCARDIOGRAM;  Surgeon: Ivin Poot, MD;  Location: [facility];  Service: Open Heart Surgery;  Laterality: N/A;  . KIDNEY STONE SURGERY     Removal  . LEFT HEART CATHETERIZATION WITH CORONARY ANGIOGRAM N/A 11/28/2012   Procedure: LEFT HEART CATHETERIZATION WITH CORONARY ANGIOGRAM;  Surgeon: Leonie Man, MD;  Location: [facility] CATH LAB;  Service: Cardiovascular;  Laterality: N/A;  . PERCUTANEOUS CORONARY STENT INTERVENTION (PCI-S)  2002   2 BMS in ostial/Prox & mid RCA (mid 2.5 mm 20x  mm, & 2.75 mm x  8 mm ostial)  . RENAL ANGIOGRAM N/A 11/30/2013   Procedure: RENAL ANGIOGRAM;  Surgeon: Lorretta Harp, MD;  Location[facility] CATH LAB;  Service:  Cardiovascular;  Laterality: N/A;  . RIGHT HEART CATHETERIZATION N/A 12/02/2012   Procedure: RIGHT HEART CATH;  Surgeon: Troy Sine, MD;  Location: [facility] CATH LAB;  Service: Cardiovascular;  Laterality: N/A;  . TEE WITH CARDIOVERSION  01/23/2011   EF 56-21%; grade 2 diastolic dysfunction, elevated mean LA filling pressure, RV systolic pressure increased consistent w/ mod pulmonary hypertension    Current Outpatient Medications  Medication Sig Dispense Refill  . alendronate (FOSAMAX) 70 MG tablet Take 70 mg by mouth once a week.     [name] aspirin 81 MG tablet Take 81 mg by mouth daily.     . carvedilol (COREG) 25 MG tablet Take 0.5 tablets (12.5 mg total) by mouth 2 (two) times daily with a meal. NEED OV. 90 tablet 0  . ezetimibe (ZETIA) 10 MG tablet Take 1 tablet (10 mg total) by mouth daily. 30 tablet 3  . hydrALAZINE (APRESOLINE) 25 MG  tablet Take 1 tablet (25 mg total) by mouth 3 (three) times daily. 90 tablet 3  . LUMIGAN 0.01 % SOLN Place 1 drop into both eyes at bedtime.     . metFORMIN (GLUCOPHAGE) 1000 MG tablet TAKE 1 TABLET TWICE A DAY WITH MEALS 180 tablet 1  . nitroGLYCERIN (NITROSTAT) 0.4 MG SL tablet Place 1 tablet (0.4 mg total) under the tongue every 5 (five) minutes as needed for chest pain. 25 tablet 3  . predniSONE (STERAPRED UNI-PAK 21 TAB) 10 MG (21) TBPK tablet Use as directed 21 tablet 0  . rosuvastatin (CRESTOR) 40 MG tablet Take one tablet by mouth once daily for cholesterol 90 tablet 3  . traMADol (ULTRAM) 50 MG tablet Take 1 tablet (50 mg total) by mouth every 8 (eight) hours as needed. 30 tablet 0   No current facility-administered medications for this visit.     Allergies:   Iron; Vicodin [hydrocodone-acetaminophen]; Amiodarone; Norvasc [amlodipine besylate]; Promethazine; and Vioxx [rofecoxib]    Social History:  The patient  reports that she quit smoking about 5 years ago. Her smoking use included cigarettes. She has a 17.10 pack-year smoking history. She has  never used smokeless tobacco. She reports that she drinks alcohol. She reports that she does not use drugs.   Family History:  The patient's family history includes Cancer in her other and sister; Diabetes in her brother, daughter, mother, other, and sister; Heart attack in her father and mother; Heart disease in her brother, brother, brother, father, mother, other, sister, sister, and sister; Other in her mother and other; Stroke in her brother, brother, and brother.    ROS:  Please see the history of present illness. All other systems are reviewed and negative.    PHYSICAL EXAM: VS:  BP 138/78 (BP Location: Right Arm)   Pulse 67   Ht 5' 2.5" (1.588 m)   Wt 154 lb (69.9 kg)   BMI 27.72 kg/m  , BMI Body mass index is 27.72 kg/m. GEN: Well nourished, well developed, female in no acute distress  HEENT: normal for age  Neck: no JVD, left greater than right carotid bruit, no masses Cardiac: RRR; soft murmur, no rubs, or gallops Respiratory:  clear to auscultation bilaterally, normal work of breathing GI: soft, nontender, nondistended, + BS MS: no deformity or atrophy; no edema; distal pulses are 1+ in all 4 extremities   Skin: warm and dry, no rash Neuro:  Strength and sensation are intact Psych: euthymic mood, full affect   EKG:  EKG is not ordered today.  ECHOApril 1, 2019 - Left ventricle: The cavity size was normal. Wall thickness was increased in a pattern of moderate LVH. Systolic function was normal. The estimated ejection fraction was in the range of 60% to 65%. Wall motion was normal; there were no regional wall motion abnormalities. Features are consistent with a pseudonormal left ventricular filling pattern, with concomitant abnormal relaxation and increased filling pressure (grade 2 diastolic dysfunction). - Aortic valve: Moderately calcified annulus. - Left atrium: The atrium was severely dilated. - Tricuspid valve: There was mild-moderate  regurgitation. - Pulmonary arteries: Systolic pressure was moderately increased. PA peak pressure: 49 mm Hg (S).  Recent Labs: 12/24/2017: ALT 7 02/03/2018: BUN 18; Creatinine, Ser 1.52; Hemoglobin 9.9; Platelets 237; Potassium 4.5; Sodium 134    Lipid Panel    Component Value Date/Time   CHOL 171 12/24/2017 0824   CHOL 159 02/06/2016 0831   TRIG 81 12/24/2017 0824   HDL 51 12/24/2017 0824  HDL 61 02/06/2016 0831   CHOLHDL 3.4 12/24/2017 0824   VLDL 14 04/15/2017 0906   LDLCALC 103 (H) 12/24/2017 0824     Wt Readings from Last 3 Encounters:  03/06/18 154 lb (69.9 kg)  03/05/18 153 lb 6.4 oz (69.6 kg)  02/13/18 160 lb 9.6 oz (72.8 kg)     Other studies Reviewed: Additional studies/ records that were reviewed today include: Office notes, hospital records and testing.  ASSESSMENT AND PLAN:  1.  Chest pain: She is hopeful that her symptoms will improve on the steroids.  2.  CAD: Stable, no further cardiovascular testing needed.  3.  Thoracoabdominal aneurysm, mural thrombus: She follows with Dr. Oneida Alar for this.  4.  PAF: She does not feel she has had any arrhythmia  5.  Chronic diastolic CHF: Her volume status is good by exam.  Her weight is stable.   Current medicines are reviewed at length with the patient today.  The patient does not have concerns regarding medicines.  The following changes have been made:  no change  Labs/ tests ordered today include:  No orders of the defined types were placed in this encounter.    Disposition:   FU with Dr. [name]  Signed, [name], PA-C  03/06/2018 1:20 PM    [place] Phone: [phone]; Fax: [phone]  This note was written with the assistance of speech recognition software. Please excuse any transcriptional errors.

## 2018-03-27 ENCOUNTER — Telehealth: Payer: Self-pay | Admitting: Pharmacist

## 2018-03-27 NOTE — Telephone Encounter (Signed)
Patient is tolerating zetia plus crestor very well.  Already has appointment for complete blood work @PCP  on 06/04/18 an f/u with Dr [name] on 06/23/2018

## 2018-03-27 NOTE — Telephone Encounter (Signed)
-----   Message from [place], [facility] sent at 02/19/2018  8:36 AM EDT ----- Regarding: Lipid f/u Taking ezetimibe 10mg ? Repeat fasting lipids in 1 more week

## 2018-04-03 DIAGNOSIS — H401131 Primary open-angle glaucoma, bilateral, mild stage: Secondary | ICD-10-CM | POA: Diagnosis not present

## 2018-04-03 DIAGNOSIS — E113212 Type 2 diabetes mellitus with mild nonproliferative diabetic retinopathy with macular edema, left eye: Secondary | ICD-10-CM | POA: Diagnosis not present

## 2018-04-03 DIAGNOSIS — H35033 Hypertensive retinopathy, bilateral: Secondary | ICD-10-CM | POA: Diagnosis not present

## 2018-04-10 DIAGNOSIS — H2512 Age-related nuclear cataract, left eye: Secondary | ICD-10-CM | POA: Diagnosis not present

## 2018-04-10 DIAGNOSIS — H35052 Retinal neovascularization, unspecified, left eye: Secondary | ICD-10-CM | POA: Diagnosis not present

## 2018-04-10 DIAGNOSIS — H34212 Partial retinal artery occlusion, left eye: Secondary | ICD-10-CM | POA: Diagnosis not present

## 2018-04-10 DIAGNOSIS — H353221 Exudative age-related macular degeneration, left eye, with active choroidal neovascularization: Secondary | ICD-10-CM | POA: Diagnosis not present

## 2018-04-20 ENCOUNTER — Other Ambulatory Visit: Payer: Self-pay | Admitting: Cardiovascular Disease

## 2018-05-09 ENCOUNTER — Other Ambulatory Visit: Payer: Self-pay

## 2018-05-09 DIAGNOSIS — I714 Abdominal aortic aneurysm, without rupture, unspecified: Secondary | ICD-10-CM

## 2018-05-12 ENCOUNTER — Telehealth: Payer: Self-pay | Admitting: Vascular Surgery

## 2018-05-12 NOTE — Telephone Encounter (Signed)
Called patient and left message regarding her upcoming appt for CTA chest,adb,pelvis ordered by Dr.Fields last year on 05/30/17. That CTA is scheduled for 05/28/18 at [address] [facility] at 9am. The patient is to arrive at 8:30am with no solid foods 4 hours prior. And the patient needs to pick up oral contrast 2-3 days prior at [address] [facility] She is scheduled to see Dr.Fields on 06/05/18 at 1pm. I also mailed her a letter with the above information. awt

## 2018-05-15 DIAGNOSIS — H35052 Retinal neovascularization, unspecified, left eye: Secondary | ICD-10-CM | POA: Diagnosis not present

## 2018-05-15 DIAGNOSIS — H359 Unspecified retinal disorder: Secondary | ICD-10-CM | POA: Diagnosis not present

## 2018-05-15 DIAGNOSIS — H353221 Exudative age-related macular degeneration, left eye, with active choroidal neovascularization: Secondary | ICD-10-CM | POA: Diagnosis not present

## 2018-05-27 ENCOUNTER — Encounter: Payer: Self-pay | Admitting: Vascular Surgery

## 2018-05-28 ENCOUNTER — Ambulatory Visit
Admission: RE | Admit: 2018-05-28 | Discharge: 2018-05-28 | Disposition: A | Discharge status: Home / Self Care | Payer: Medicare Other | Source: Ambulatory Visit | Attending: Vascular Surgery | Admitting: Vascular Surgery

## 2018-05-28 DIAGNOSIS — I714 Abdominal aortic aneurysm, without rupture, unspecified: Secondary | ICD-10-CM

## 2018-05-28 DIAGNOSIS — I712 Thoracic aortic aneurysm, without rupture: Secondary | ICD-10-CM | POA: Diagnosis not present

## 2018-05-28 MED ORDER — IOPAMIDOL (ISOVUE-370) INJECTION 76%
75.0000 mL | Freq: Once | INTRAVENOUS | Status: AC | PRN
  Administered 2018-05-28: 75 mL via INTRAVENOUS

## 2018-06-03 ENCOUNTER — Other Ambulatory Visit: Payer: Medicare Other

## 2018-06-03 DIAGNOSIS — N183 Chronic kidney disease, stage 3 unspecified: Secondary | ICD-10-CM

## 2018-06-03 DIAGNOSIS — I480 Paroxysmal atrial fibrillation: Secondary | ICD-10-CM

## 2018-06-03 DIAGNOSIS — E1122 Type 2 diabetes mellitus with diabetic chronic kidney disease: Secondary | ICD-10-CM

## 2018-06-03 DIAGNOSIS — I2510 Atherosclerotic heart disease of native coronary artery without angina pectoris: Secondary | ICD-10-CM | POA: Diagnosis not present

## 2018-06-03 DIAGNOSIS — I10 Essential (primary) hypertension: Secondary | ICD-10-CM

## 2018-06-04 DIAGNOSIS — E1122 Type 2 diabetes mellitus with diabetic chronic kidney disease: Secondary | ICD-10-CM | POA: Diagnosis not present

## 2018-06-04 DIAGNOSIS — I129 Hypertensive chronic kidney disease with stage 1 through stage 4 chronic kidney disease, or unspecified chronic kidney disease: Secondary | ICD-10-CM | POA: Diagnosis not present

## 2018-06-04 DIAGNOSIS — I701 Atherosclerosis of renal artery: Secondary | ICD-10-CM | POA: Diagnosis not present

## 2018-06-04 DIAGNOSIS — N183 Chronic kidney disease, stage 3 (moderate): Secondary | ICD-10-CM | POA: Diagnosis not present

## 2018-06-04 DIAGNOSIS — K921 Melena: Secondary | ICD-10-CM | POA: Diagnosis not present

## 2018-06-04 DIAGNOSIS — I4891 Unspecified atrial fibrillation: Secondary | ICD-10-CM | POA: Diagnosis not present

## 2018-06-04 LAB — COMPLETE METABOLIC PANEL WITH GFR
AG Ratio: 1.7 (calc) (ref 1.0–2.5)
ALT: 6 U/L (ref 6–29)
AST: 14 U/L (ref 10–35)
Albumin: 3.8 g/dL (ref 3.6–5.1)
Alkaline phosphatase (APISO): 42 U/L (ref 33–130)
BUN/Creatinine Ratio: 20 (calc) (ref 6–22)
BUN: 32 mg/dL — ABNORMAL HIGH (ref 7–25)
CO2: 26 mmol/L (ref 20–32)
Calcium: 9.3 mg/dL (ref 8.6–10.4)
Chloride: 105 mmol/L (ref 98–110)
Creat: 1.57 mg/dL — ABNORMAL HIGH (ref 0.60–0.93)
GFR, Est African American: 36 mL/min/{1.73_m2} — ABNORMAL LOW (ref >=60)
GFR, Est Non African American: 31 mL/min/{1.73_m2} — ABNORMAL LOW (ref >=60)
Globulin: 2.3 g/dL (calc) (ref 1.9–3.7)
Glucose, Bld: 97 mg/dL (ref 65–99)
Potassium: 4.5 mmol/L (ref 3.5–5.3)
Sodium: 138 mmol/L (ref 135–146)
Total Bilirubin: 0.7 mg/dL (ref 0.2–1.2)
Total Protein: 6.1 g/dL (ref 6.1–8.1)

## 2018-06-04 LAB — LIPID PANEL
Cholesterol: 147 mg/dL (ref <200)
HDL: 45 mg/dL — ABNORMAL LOW (ref >50)
LDL Cholesterol (Calc): 86 mg/dL (calc)
Non-HDL Cholesterol (Calc): 102 mg/dL (calc) (ref <130)
Total CHOL/HDL Ratio: 3.3 (calc) (ref <5.0)
Triglycerides: 69 mg/dL (ref <150)

## 2018-06-04 LAB — CBC WITH DIFFERENTIAL/PLATELET
Basophils Absolute: 49 cells/uL (ref 0–200)
Basophils Relative: 1 %
Eosinophils Absolute: 142 cells/uL (ref 15–500)
Eosinophils Relative: 2.9 %
HCT: 28.1 % — ABNORMAL LOW (ref 35.0–45.0)
Hemoglobin: 8.7 g/dL — ABNORMAL LOW (ref 11.7–15.5)
Lymphs Abs: 1127 cells/uL (ref 850–3900)
MCH: 24.9 pg — ABNORMAL LOW (ref 27.0–33.0)
MCHC: 31 g/dL — ABNORMAL LOW (ref 32.0–36.0)
MCV: 80.5 fL (ref 80.0–100.0)
MPV: 10.5 fL (ref 7.5–12.5)
Monocytes Relative: 14.2 %
Neutro Abs: 2886 cells/uL (ref 1500–7800)
Neutrophils Relative %: 58.9 %
Platelets: 232 10*3/uL (ref 140–400)
RBC: 3.49 10*6/uL — ABNORMAL LOW (ref 3.80–5.10)
RDW: 15.4 % — ABNORMAL HIGH (ref 11.0–15.0)
Total Lymphocyte: 23 %
WBC mixed population: 696 cells/uL (ref 200–950)
WBC: 4.9 10*3/uL (ref 3.8–10.8)

## 2018-06-04 LAB — HEMOGLOBIN A1C
Hgb A1c MFr Bld: 5.9 % of total Hgb — ABNORMAL HIGH (ref <5.7)
Mean Plasma Glucose: 123 (calc)
eAG (mmol/L): 6.8 (calc)

## 2018-06-05 ENCOUNTER — Encounter: Payer: Self-pay | Admitting: *Deleted

## 2018-06-05 ENCOUNTER — Encounter (HOSPITAL_COMMUNITY): Payer: Medicare Other

## 2018-06-05 ENCOUNTER — Ambulatory Visit: Payer: Medicare Other | Admitting: Internal Medicine

## 2018-06-05 ENCOUNTER — Ambulatory Visit: Payer: Medicare Other | Admitting: Vascular Surgery

## 2018-06-05 ENCOUNTER — Telehealth: Payer: Self-pay | Admitting: Cardiovascular Disease

## 2018-06-05 NOTE — Telephone Encounter (Signed)
Received records from [facility] on 06/05/18, Appt 06/23/18 @ 11:20AM. [place]

## 2018-06-10 ENCOUNTER — Encounter: Payer: Self-pay | Admitting: Gastroenterology

## 2018-06-19 DIAGNOSIS — H2512 Age-related nuclear cataract, left eye: Secondary | ICD-10-CM | POA: Diagnosis not present

## 2018-06-19 DIAGNOSIS — H34212 Partial retinal artery occlusion, left eye: Secondary | ICD-10-CM | POA: Diagnosis not present

## 2018-06-19 DIAGNOSIS — H353221 Exudative age-related macular degeneration, left eye, with active choroidal neovascularization: Secondary | ICD-10-CM | POA: Diagnosis not present

## 2018-06-23 ENCOUNTER — Ambulatory Visit (INDEPENDENT_AMBULATORY_CARE_PROVIDER_SITE_OTHER): Payer: Medicare Other | Admitting: Cardiovascular Disease

## 2018-06-23 ENCOUNTER — Encounter: Payer: Self-pay | Admitting: Cardiovascular Disease

## 2018-06-23 VITALS — BP 116/64 | HR 57 | Ht 62.0 in | Wt 152.8 lb

## 2018-06-23 DIAGNOSIS — I5032 Chronic diastolic (congestive) heart failure: Secondary | ICD-10-CM

## 2018-06-23 DIAGNOSIS — I701 Atherosclerosis of renal artery: Secondary | ICD-10-CM | POA: Diagnosis not present

## 2018-06-23 DIAGNOSIS — E7800 Pure hypercholesterolemia, unspecified: Secondary | ICD-10-CM

## 2018-06-23 DIAGNOSIS — I150 Renovascular hypertension: Secondary | ICD-10-CM

## 2018-06-23 DIAGNOSIS — I480 Paroxysmal atrial fibrillation: Secondary | ICD-10-CM | POA: Diagnosis not present

## 2018-06-23 DIAGNOSIS — I716 Thoracoabdominal aortic aneurysm, without rupture, unspecified: Secondary | ICD-10-CM

## 2018-06-23 DIAGNOSIS — I2721 Secondary pulmonary arterial hypertension: Secondary | ICD-10-CM | POA: Diagnosis not present

## 2018-06-23 DIAGNOSIS — I2510 Atherosclerotic heart disease of native coronary artery without angina pectoris: Secondary | ICD-10-CM

## 2018-06-23 DIAGNOSIS — I739 Peripheral vascular disease, unspecified: Secondary | ICD-10-CM

## 2018-06-23 DIAGNOSIS — N183 Chronic kidney disease, stage 3 unspecified: Secondary | ICD-10-CM

## 2018-06-23 DIAGNOSIS — E119 Type 2 diabetes mellitus without complications: Secondary | ICD-10-CM

## 2018-06-23 DIAGNOSIS — D631 Anemia in chronic kidney disease: Secondary | ICD-10-CM

## 2018-06-23 DIAGNOSIS — I6523 Occlusion and stenosis of bilateral carotid arteries: Secondary | ICD-10-CM | POA: Diagnosis not present

## 2018-06-23 NOTE — Patient Instructions (Signed)
Medication Instructions: Dr [name] has recommended making the following medication changes: 1. TAKE your Hyrdalazine on a sliding scale If systolic blood pressure (top number) is:  Less than 110 mmHg: HOLD Hydralazine  Higher than 180 mmHg: take 2 tablets of Hydralazine  (50 mg total)  If between 110-180 mmHg: take 1 tablet (25 mg total)  Labwork: NONE ORDERED  Testing/Procedures: 1. Renal Duplex - Your physician has requested that you have a renal artery duplex. During this test, an ultrasound is used to evaluate blood flow to the kidneys. Allow one hour for this exam. Do not eat after midnight the day before and avoid carbonated beverages. Take your medications as you usually do.  Follow-up: Dr [name] recommends that you schedule a follow-up appointment in 6 months. You will receive a reminder letter in the mail two months in advance. If you don't receive a letter, please call our office to schedule the follow-up appointment.  If you need a refill on your cardiac medications before your next appointment, please call your pharmacy.

## 2018-06-23 NOTE — Progress Notes (Signed)
Patient ID: Kendra Todd, female   DOB: 1941/10/12, 77 y.o.   MRN: [id number]    Cardiology Office Note    Date:  06/23/2018   ID:  Kendra Todd, Kendra Todd 1940/12/27, MRN [id number]  PCP:  Kendra Curry, DO  Cardiologist:   [name], MD   No chief complaint on file.   History of Present Illness:  Kendra Todd is a 77 y.o. female with CAD status post CABG, hypertensive heart disease with diastolic HF, severe hypertension, thoracoabdominal aortic aneurysm, recurrent carotid artery disease, history of stroke, hyperlipidemia, paroxysmal atrial fibrillation and severe pulmonary artery hypertension, chronic kidney disease stage III.  She is not having any problems with edema or with dyspnea at rest or with activity.  Her chest pain resolved.  It seems to have been musculoskeletal.  She had a localized ulcerated plaque in her descending thoracic aorta.  CT angiogram of the aorta performed on July 24 continues to show marked irregular plaque at the level of the diaphragmatic segment of the aorta (maximum diameter 4.4 cm).  The renal arteries appeared to have probable high-grade ostial stenosis bilaterally but were difficult to visualize.  There is a 50% ostial stenosis of the celiac artery and a greater than 70% stenosis of the SMA.  2014 her renal duplex ultrasound suggested progression of renal artery stenosis to severe stenosis bilaterally.  She underwent invasive angiography the moderate stenosis no medical therapy was recommended (Dr. Gwenlyn Found).  Biggest problem remains the highly volatile blood pressure.  At times her blood pressure will be 200/100 and she feels dizzy and has blurry vision.  This morning she was weak and dizzy and her blood pressure was 87/50.  She had already taken all her medications. She has a slightly lower blood pressure in her left upper extremity compared with the right but does not describe symptoms suggestive of claudication or steal syndrome.  Anticoagulants.  She has not had  any GI bleeding.  However, at her last visit with Dr. Hollie Todd her hemoglobin was only 8.7 and she was referred to [place] (appointment in October).  Kendra Todd has extensive coronary and peripheral arterial disease. She underwent coronary bypass surgery January 2014, after having previously undergone stents to the right coronary artery in 2002 and several angioplasty procedures before that. Echocardiogram performed in January 2017 showed severe pulmonary artery hypertension (estimated systolic PA pressure 73 mmHg) in the setting of pseudo-normal mitral inflow consistent with grade 2 diastolic dysfunction/elevated left atrial filling pressures. Moderate mitral regurgitation was also present  She has undergone bilateral carotid endarterectomies and has recurrent stenoses. One of her surgeries was complicated by a dissection flap and a stroke, from which she has recovered. She has a moderate size type 2 thoracoabdominal aortic aneurysm (recent reimaging with Dr. Oneida Alar shows a stable diameter of 4.4 cm).   She has previously undergone TEE guided cardioversion for paroxysmal atrial fibrillation (2012) Had postoperative atrial fibrillation after bypass surgery. Anticoagulants were stopped due to gastrointestinal bleeding that required transfusion. She takes aspirin. She was poorly tolerant to amiodarone (nausea, anorexia, 20 lb weight loss).   Past Medical History:  Diagnosis Date  . Abdominal pain, epigastric   . Acute upper respiratory infections of unspecified site   . Arthritis   . Benign paroxysmal positional vertigo   . CAD (coronary artery disease)    prior coronary stenting  . Candidiasis of vulva and vagina   . Carotid artery disease ([place]) 2001   s/p Bilateral CEA, after  CVA  . Cervicalgia   . Decreased pedal pulses 06/04/2011   doppler - bilateral ABIs no evidence of insufficiency; L CIA slightly elevated velocities 20-30% diameter reduction;   . Dyspnea 03/13/2006   Myoview - EF 76%; mild  ischemia in apical lateral region, less severe than previous study in 2002  . Edema   . H/O carotid endarterectomy 03/06/2012   CT angio -high grade stenosis of proximal R internal carotid w/ lg posterior ulceration or dissection flap; near occlusive stenosis at origin of nondominantproximal L vertebral artery; <50% stenosis of proximal R vertebral and proximal L subclavian artery  . H/O endarterectomy 09/11/2012   patent L and R carotid sites, R 60-79% restenosed ICA; L <40% restenosed ICA  . H/O endarterectomy 01/08/2012   doppler - R distal common carotid/proximal ICA stenosed 80-99%, L 0-39%  . HTN (hypertension) 11/27/2012  . Hyperlipidemia   . Limb pain 05/02/2011   doppler - no evidence of thrombus of thrombophlebitis  . Long term (current) use of anticoagulants   . Loss of weight   . Lumbago   . Myalgia and myositis, unspecified   . Myocardial infarction ([place])   . Nonspecific abnormal results of liver function study   . Nontoxic uninodular goiter   . Osteoarthrosis, unspecified whether generalized or localized, unspecified site   . Other malaise and fatigue   . Other manifestations of vitamin A deficiency   . PAF (paroxysmal atrial fibrillation) ([place]) 02/21/2011  . Pain in joint, pelvic region and thigh   . Pain in limb   . Peripheral vascular disease ([place])   . Phlebitis and thrombophlebitis of superficial vessels of lower extremities   . Primary localized osteoarthrosis, hand   . Pulmonary HTN ([place]) 12/03/2012  . Renal artery stenosis ([place])   . Routine gynecological examination   . Spinal stenosis, unspecified region other than cervical   . Stenosis of artery ([place]) 06/03/2012   doppler- most distal aspect of abd aorta no aneurysmal dilation; bilateral ABIs - mild L arterial insufficiency, L CIA narrowing w/ 50-69% diameter reduction; L and R SFA both w/ 0-49% diameter reductions  . Stroke ([place]) 2001  . Superficial vein thrombosis 05/11/2015   Left forehead  . Tobacco use disorder     . TR (tricuspid regurgitation), severe 12/02/12 for repair with CABG 12/03/2012  . Type II or unspecified type diabetes mellitus with peripheral circulatory disorders, not stated as uncontrolled(250.70)   . Unspecified cataract   . Unspecified disorder of iris and ciliary body   . Unspecified vitamin D deficiency   . Urinary tract infection, site not specified     Past Surgical History:  Procedure Laterality Date  . ABDOMINAL HYSTERECTOMY  1975  . ANGIOPLASTY  1984 and 1982  . APPENDECTOMY  1954  . ARCH AORTOGRAM  03/30/2011   40% recurrent stenosis at origin of R proximal carotid patch, shelf like recurrent stenosis w/in midsection of patch that does not appear to be flow limiting; normal non-stenosed great vessel origins  . BACK SURGERY  '84,'86, '87   three previous surgeries  . CAROTID ENDARTERECTOMY  2001   Bilateral  . CORONARY ARTERY BYPASS GRAFT  12/04/2012   Procedure: CORONARY ARTERY BYPASS GRAFTING (CABG);  Surgeon: Ivin Poot, MD; LIMA-LAD, SVG-OM, SVG-RCA  . INTRAOPERATIVE TRANSESOPHAGEAL ECHOCARDIOGRAM  12/04/2012   Procedure: INTRAOPERATIVE TRANSESOPHAGEAL ECHOCARDIOGRAM;  Surgeon: Ivin Poot, MD;  Location: [facility];  Service: Open Heart Surgery;  Laterality: N/A;  . KIDNEY STONE SURGERY  Removal  . LEFT HEART CATHETERIZATION WITH CORONARY ANGIOGRAM N/A 11/28/2012   Procedure: LEFT HEART CATHETERIZATION WITH CORONARY ANGIOGRAM;  Surgeon: Leonie Man, MD;  Location: [facility] CATH LAB;  Service: Cardiovascular;  Laterality: N/A;  . PERCUTANEOUS CORONARY STENT INTERVENTION (PCI-S)  2002   2 BMS in ostial/Prox & mid RCA (mid 2.5 mm 20x  mm, & 2.75 mm x  8 mm ostial)  . RENAL ANGIOGRAM N/A 11/30/2013   Procedure: RENAL ANGIOGRAM;  Surgeon: Lorretta Harp, MD;  Location[facility];  Service: Cardiovascular;  Laterality: N/A;  . RIGHT HEART CATHETERIZATION N/A 12/02/2012   Procedure: RIGHT HEART CATH;  Surgeon: Troy Sine, MD;  Location: [facility] CATH LAB;  Service:  Cardiovascular;  Laterality: N/A;  . TEE WITH CARDIOVERSION  01/23/2011   EF 51-76%; grade 2 diastolic dysfunction, elevated mean LA filling pressure, RV systolic pressure increased consistent w/ mod pulmonary hypertension    Current Medications: Outpatient Medications Prior to Visit  Medication Sig Dispense Refill  . alendronate (FOSAMAX) 70 MG tablet Take 70 mg by mouth once a week.     [name] aspirin 81 MG tablet Take 81 mg by mouth daily.     . carvedilol (COREG) 25 MG tablet TAKE ONE-HALF (1/2) TABLET TWICE A DAY WITH MEALS (NEEDS OFFICE VISIT) 90 tablet 3  . ezetimibe (ZETIA) 10 MG tablet Take 1 tablet (10 mg total) by mouth daily. 30 tablet 3  . furosemide (LASIX) 20 MG tablet Take 20 mg by mouth as needed.    . hydrALAZINE (APRESOLINE) 25 MG tablet Take 1 tablet (25 mg total) by mouth 3 (three) times daily. 90 tablet 3  . LUMIGAN 0.01 % SOLN Place 1 drop into both eyes at bedtime.     . metFORMIN (GLUCOPHAGE) 1000 MG tablet TAKE 1 TABLET TWICE A DAY WITH MEALS 180 tablet 1  . nitroGLYCERIN (NITROSTAT) 0.4 MG SL tablet Place 1 tablet (0.4 mg total) under the tongue every 5 (five) minutes as needed for chest pain. 25 tablet 3  . rosuvastatin (CRESTOR) 40 MG tablet Take one tablet by mouth once daily for cholesterol 90 tablet 3  . traMADol (ULTRAM) 50 MG tablet Take 1 tablet (50 mg total) by mouth every 8 (eight) hours as needed. 30 tablet 0  . predniSONE (STERAPRED UNI-PAK 21 TAB) 10 MG (21) TBPK tablet Use as directed (Patient not taking: Reported on 06/23/2018) 21 tablet 0   No facility-administered medications prior to visit.      Allergies:   Iron; Vicodin [hydrocodone-acetaminophen]; Amiodarone; Norvasc [amlodipine besylate]; Promethazine; and Vioxx [rofecoxib]   Social History   Socioeconomic History  . Marital status: Widowed    Spouse name: Not on file  . Number of children: Not on file  . Years of education: Not on file  . Highest education level: Not on file  Occupational  History  . Not on file  Social Needs  . Financial resource strain: Not hard at all  . Food insecurity:    Worry: Never true    Inability: Never true  . Transportation needs:    Medical: No    Non-medical: No  Tobacco Use  . Smoking status: Former Smoker    Packs/day: 0.30    Years: 57.00    Pack years: 17.10    Types: Cigarettes    Last attempt to quit: 11/19/2012    Years since quitting: 5.5  . Smokeless tobacco: Never Used  Substance and Sexual Activity  . Alcohol use: Yes  Comment: rarely, 1 a month  . Drug use: No  . Sexual activity: Never  Lifestyle  . Physical activity:    Days per week: 0 days    Minutes per session: 0 min  . Stress: Only a little  Relationships  . Social connections:    Talks on phone: More than three times a week    Gets together: More than three times a week    Attends religious service: More than 4 times per year    Active member of club or organization: Yes    Attends meetings of clubs or organizations: More than 4 times per year    Relationship status: Widowed  Other Topics Concern  . Not on file  Social History Narrative  . Not on file     Family History:  The patient's family history includes Cancer in her other and sister; Diabetes in her brother, daughter, mother, other, and sister; Heart attack in her father and mother; Heart disease in her brother, brother, brother, father, mother, other, sister, sister, and sister; Other in her mother and other; Stroke in her brother, brother, and brother.   ROS:   Please see the history of present illness.    ROS All other systems reviewed and are negative.   PHYSICAL EXAM:   VS:  BP 116/64   Pulse (!) 57   Ht 5\' 2"  (1.575 m)   Wt 152 lb 12.8 oz (69.3 kg)   BMI 27.95 kg/m    Pressure in the left arm is lower by approximately 10 mmHg compared to the right  General: Alert, oriented x3, no distress, appears comfortable Head: no evidence of trauma, PERRL, EOMI, no exophtalmos or lid lag,  no myxedema, no xanthelasma; normal ears, nose and oropharynx Neck: normal jugular venous pulsations and no hepatojugular reflux; brisk carotid pulses without delay.  Has bilateral endarterectomy scars and bilateral carotid bruits, especially louder on the right Chest: clear to auscultation, no signs of consolidation by percussion or palpation, normal fremitus, symmetrical and full respiratory excursions Cardiovascular: normal position and quality of the apical impulse, regular rhythm, normal first and second heart sounds, no murmurs, rubs or gallops.  Systolic bruits throughout the abdomen Abdomen: no tenderness or distention, no masses by palpation, no abnormal pulsatility, normal bowel sounds, no hepatosplenomegaly Extremities: no clubbing, cyanosis or edema; 2+ radial, ulnar and brachial pulses bilaterally; 2+ right femoral, posterior tibial and dorsalis pedis pulses; 2+ left femoral, posterior tibial and dorsalis pedis pulses; no subclavian or femoral bruits Neurological: grossly nonfocal Psych: Normal mood and affect   Wt Readings from Last 3 Encounters:  06/23/18 152 lb 12.8 oz (69.3 kg)  03/06/18 154 lb (69.9 kg)  03/05/18 153 lb 6.4 oz (69.6 kg)      Studies/Labs Reviewed:   EKG:  EKG is ordered today.  Today's ECG shows sinus rhythm with sinus arrhythmia and possible old inferior and anterior myocardial infarction QTC 461 ms  Recent Labs: 06/03/2018: ALT 6; BUN 32; Creat 1.57; Hemoglobin 8.7; Platelets 232; Potassium 4.5; Sodium 138   Lipid Panel    Component Value Date/Time   CHOL 147 06/03/2018 0822   CHOL 159 02/06/2016 0831   TRIG 69 06/03/2018 0822   HDL 45 (L) 06/03/2018 0822   HDL 61 02/06/2016 0831   CHOLHDL 3.3 06/03/2018 0822   VLDL 14 04/15/2017 0906   LDLCALC 86 06/03/2018 0822     ASSESSMENT:    1. Chronic diastolic heart failure ([place])   2. Coronary artery disease  involving native coronary artery of native heart without angina pectoris   3. Bilateral  carotid artery stenosis   4. Thoracoabdominal aortic aneurysm (TAAA) without rupture ([place])   5. Bilateral renal artery stenosis (H[facility])   6. PAD (peripheral artery disease) ([place])   7. Hypercholesterolemia   8. Paroxysmal atrial fibrillation (H[facility])   9. Renovascular hypertension   10. Pulmonary artery hypertension ([place])   11. Diabetes mellitus type 2 in nonobese (HCC)   12. CKD (chronic kidney disease) stage 3, GFR 30-59 ml/min (HCC)   13. Anemia due to stage 3 chronic kidney disease ([place])      PLAN:  In order of problems listed above:  1. CHF: Appears to be euvolemic.  Functional class II.  No indication for invasive evaluation at this time.  Aware of the need for careful monitoring of sodium intake and daily weights. 2. CAD S/P CABG -currently asymptomatic, no symptoms of angina pectoris. (1993 PTCA to circumflex. 2002 2XBMS to ostial RCA, S/P CABG x 3 12/04/12) 3. Carotid disease - has known significant restenosis in the right carotid, asymptomatic. Would be a candidate for carotid stent if this requires another revascularization. 4. Thoracoabdominal aortic aneurysm - stable dimensions by CT scan performed most recently May 28, 2018 5. RAS -difficult to evaluate by CT, but suspicion for severe bilateral ostial stenosis.  Repeat her duplex ultrasound and compared with the study from 2014 6. PAD - she does not have intermittent claudication. Doppler study in July of 2013 showed a 50-69% stenosis in the left common iliac artery there was three-vessel runoff in the calf on the right side there was two-vessel runoff to 2 occlusion of the anterior tibial artery with distal reconstitution. 7. HLP -small dose rosuvastatin plus Zetia.  Not quite at goal LDL under 70, but well over 60% reduction in LDL from baseline.. She is not interested in PCS K9 inhibitors.  8. PAF -no recent symptomatic events, but confirmed on recent event monitoring.  Unable to take anticoagulation due to recurrent GI bleeding..  Note poorly tolerated amiodarone therapy in the past. Embolic risk remains very high, although her only stroke in the past was related to complications of carotid surgery rather than an embolic event. Even discounting her stroke, CHADSVasc score is 73 (age 56, gender, HTN, vascular disease, CHF, DM). 9. HTN-she has at least mild left subclavian stenosis and she should always check her blood pressure exclusively in the right arm.  Blood pressure remains very volatile.  Have asked her to base the dose of hydralazine on her blood pressure.  If her systolic blood pressure is already under 110 she should not take this medication.  If it is over 180 she can take twice the usual amount (50 mg instead of 25 mg). 10. PAH - this is probably secondary to diastolic left ventricular dysfunction. Cardiac catheterization was offered, but she declined because she is worried about mechanical complications, as had occurred in the past. 11. Mild diabetes mellitus on metformin monotherapy 12. Chronic kidney disease stage III, GFR approximately 30, stable. 13. Anemia: Recurrent GI bleeding overtly when she was taking anticoagulants.  Reports some off/on dark diarrhea.  Seems to have anemia beyond what would be expected for her renal insufficiency.  Has an appointment scheduled with GI.  Medication Adjustments/Labs and Tests Ordered: Current medicines are reviewed at length with the patient today.  Concerns regarding medicines are outlined above.  Medication changes, Labs and Tests ordered today are listed in the Patient Instructions below. Patient Instructions  Medication Instructions: Dr [name] has recommended making the following medication changes: 1. TAKE your Hyrdalazine on a sliding scale If systolic blood pressure (top number) is:  Less than 110 mmHg: HOLD Hydralazine  Higher than 180 mmHg: take 2 tablets of Hydralazine  (50 mg total)  If between 110-180 mmHg: take 1 tablet (25 mg total)  Labwork: NONE  ORDERED  Testing/Procedures: 1. Renal Duplex - Your physician has requested that you have a renal artery duplex. During this test, an ultrasound is used to evaluate blood flow to the kidneys. Allow one hour for this exam. Do not eat after midnight the day before and avoid carbonated beverages. Take your medications as you usually do.  Follow-up: Dr [name] recommends that you schedule a follow-up appointment in 6 months. You will receive a reminder letter in the mail two months in advance. If you don't receive a letter, please call our office to schedule the follow-up appointment.  If you need a refill on your cardiac medications before your next appointment, please call your pharmacy.    Signed, [name], MD  06/23/2018 1:27 PM    [facility] [address], [place], [place]  [address] Phone: [phone]; Fax: [phone]

## 2018-07-03 ENCOUNTER — Ambulatory Visit (HOSPITAL_COMMUNITY)
Admission: RE | Admit: 2018-07-03 | Discharge: 2018-07-03 | Disposition: A | Discharge status: Home / Self Care | Payer: Medicare Other | Source: Ambulatory Visit | Attending: Cardiology | Admitting: Cardiology

## 2018-07-03 DIAGNOSIS — I739 Peripheral vascular disease, unspecified: Secondary | ICD-10-CM | POA: Diagnosis not present

## 2018-07-10 ENCOUNTER — Encounter: Payer: Self-pay | Admitting: Internal Medicine

## 2018-07-10 ENCOUNTER — Ambulatory Visit (INDEPENDENT_AMBULATORY_CARE_PROVIDER_SITE_OTHER): Payer: Medicare Other | Admitting: Internal Medicine

## 2018-07-10 VITALS — BP 180/90 | HR 63 | Temp 98.6°F | Ht 62.0 in | Wt 152.0 lb

## 2018-07-10 DIAGNOSIS — K921 Melena: Secondary | ICD-10-CM | POA: Diagnosis not present

## 2018-07-10 DIAGNOSIS — I2510 Atherosclerotic heart disease of native coronary artery without angina pectoris: Secondary | ICD-10-CM | POA: Diagnosis not present

## 2018-07-10 DIAGNOSIS — E1122 Type 2 diabetes mellitus with diabetic chronic kidney disease: Secondary | ICD-10-CM

## 2018-07-10 DIAGNOSIS — D509 Iron deficiency anemia, unspecified: Secondary | ICD-10-CM

## 2018-07-10 DIAGNOSIS — R0989 Other specified symptoms and signs involving the circulatory and respiratory systems: Secondary | ICD-10-CM | POA: Insufficient documentation

## 2018-07-10 DIAGNOSIS — R5383 Other fatigue: Secondary | ICD-10-CM | POA: Insufficient documentation

## 2018-07-10 DIAGNOSIS — N183 Chronic kidney disease, stage 3 unspecified: Secondary | ICD-10-CM

## 2018-07-10 DIAGNOSIS — I701 Atherosclerosis of renal artery: Secondary | ICD-10-CM

## 2018-07-10 LAB — CBC WITH DIFFERENTIAL/PLATELET
Basophils Absolute: 58 cells/uL (ref 0–200)
Basophils Relative: 1.2 %
Eosinophils Absolute: 139 cells/uL (ref 15–500)
Eosinophils Relative: 2.9 %
HCT: 30.5 % — ABNORMAL LOW (ref 35.0–45.0)
Hemoglobin: 9.4 g/dL — ABNORMAL LOW (ref 11.7–15.5)
Lymphs Abs: 1238 cells/uL (ref 850–3900)
MCH: 24.2 pg — ABNORMAL LOW (ref 27.0–33.0)
MCHC: 30.8 g/dL — ABNORMAL LOW (ref 32.0–36.0)
MCV: 78.4 fL — ABNORMAL LOW (ref 80.0–100.0)
MPV: 10.3 fL (ref 7.5–12.5)
Monocytes Relative: 15.5 %
Neutro Abs: 2621 cells/uL (ref 1500–7800)
Neutrophils Relative %: 54.6 %
Platelets: 204 10*3/uL (ref 140–400)
RBC: 3.89 10*6/uL (ref 3.80–5.10)
RDW: 15.3 % — ABNORMAL HIGH (ref 11.0–15.0)
Total Lymphocyte: 25.8 %
WBC mixed population: 744 cells/uL (ref 200–950)
WBC: 4.8 10*3/uL (ref 3.8–10.8)

## 2018-07-10 LAB — IRON,TIBC AND FERRITIN PANEL
%SAT: 9 % (calc) — ABNORMAL LOW (ref 16–45)
Ferritin: 38 ng/mL (ref 16–288)
Iron: 33 ug/dL — ABNORMAL LOW (ref 45–160)
TIBC: 379 mcg/dL (calc) (ref 250–450)

## 2018-07-10 NOTE — Progress Notes (Signed)
Location:  [facility] clinic Provider:  Naitik Hermann L. Mariea Clonts, D.O., C.M.D.  Code Status: full code, but has living will with desire for natural death Goals of Care:  Advanced Directives 03/05/2018  Does Patient Have a Medical Advance Directive? Yes  Type of Advance Directive Living will  Does patient want to make changes to medical advance directive? -  Copy of [place] in Chart? -  Would patient like information on creating a medical advance directive? -  Pre-existing out of facility DNR order (yellow form or pink MOST form) -     Chief Complaint  Patient presents with  . Medical Management of Chronic Issues    82mth follow-up    HPI: Patient is a 77 y.o. female seen today for medical management of chronic diseases.    Says there are concerns that the arteries to her kidneys are closing up affecting her kidneys.   She is allergic to iron--got hives when she took iron more than once.  Anemia has gotten worse.  She has seen some dark stool, not regularly.  Did not correlate with foods she'd eaten.  Weight is fairly stable.  Very tired.  No energy to do anything.  Even walking is getting her out of breath.  Had abdominal pain last night.  Went to tinkle every 15-20 mins.  She thinks she had some fluid on her body.  Had not otherwise had abdominal pain.  No more pain.  Still going more than usual.  No dysuria.  Does not feel like UTI did before. Apparently, nephrology already placed the GI referral.    Still has a little pain around the shoulder blade like she had before. Stays sore around that muscle.    She already has an appt with GI 10/1.    Sees Dr. Gwenlyn Found Tuesday about her kidney.  Discussed with her need for influenza vaccine.  I've been trying to convince her for years.  She says that she never took a flu shot before and has never gotten the flu.  She says she will get the flu if she takes it and she's not starting that now.  She says if she gets the flu it's her own  fault not mine.    Past Medical History:  Diagnosis Date  . Abdominal pain, epigastric   . Acute upper respiratory infections of unspecified site   . Arthritis   . Benign paroxysmal positional vertigo   . CAD (coronary artery disease)    prior coronary stenting  . Candidiasis of vulva and vagina   . Carotid artery disease ([place]) 2001   s/p Bilateral CEA, after CVA  . Cervicalgia   . Decreased pedal pulses 06/04/2011   doppler - bilateral ABIs no evidence of insufficiency; L CIA slightly elevated velocities 20-30% diameter reduction;   . Dyspnea 03/13/2006   Myoview - EF 76%; mild ischemia in apical lateral region, less severe than previous study in 2002  . Edema   . H/O carotid endarterectomy 03/06/2012   CT angio -high grade stenosis of proximal R internal carotid w/ lg posterior ulceration or dissection flap; near occlusive stenosis at origin of nondominantproximal L vertebral artery; <50% stenosis of proximal R vertebral and proximal L subclavian artery  . H/O endarterectomy 09/11/2012   patent L and R carotid sites, R 60-79% restenosed ICA; L <40% restenosed ICA  . H/O endarterectomy 01/08/2012   doppler - R distal common carotid/proximal ICA stenosed 80-99%, L 0-39%  . HTN (hypertension) 11/27/2012  .  Hyperlipidemia   . Limb pain 05/02/2011   doppler - no evidence of thrombus of thrombophlebitis  . Long term (current) use of anticoagulants   . Loss of weight   . Lumbago   . Myalgia and myositis, unspecified   . Myocardial infarction ([place])   . Nonspecific abnormal results of liver function study   . Nontoxic uninodular goiter   . Osteoarthrosis, unspecified whether generalized or localized, unspecified site   . Other malaise and fatigue   . Other manifestations of vitamin A deficiency   . PAF (paroxysmal atrial fibrillation) ([place]) 02/21/2011  . Pain in joint, pelvic region and thigh   . Pain in limb   . Peripheral vascular disease ([place])   . Phlebitis and thrombophlebitis of  superficial vessels of lower extremities   . Primary localized osteoarthrosis, hand   . Pulmonary HTN ([place]) 12/03/2012  . Renal artery stenosis ([place])   . Routine gynecological examination   . Spinal stenosis, unspecified region other than cervical   . Stenosis of artery ([place]) 06/03/2012   doppler- most distal aspect of abd aorta no aneurysmal dilation; bilateral ABIs - mild L arterial insufficiency, L CIA narrowing w/ 50-69% diameter reduction; L and R SFA both w/ 0-49% diameter reductions  . Stroke ([place]) 2001  . Superficial vein thrombosis 05/11/2015   Left forehead  . Tobacco use disorder   . TR (tricuspid regurgitation), severe 12/02/12 for repair with CABG 12/03/2012  . Type II or unspecified type diabetes mellitus with peripheral circulatory disorders, not stated as uncontrolled(250.70)   . Unspecified cataract   . Unspecified disorder of iris and ciliary body   . Unspecified vitamin D deficiency   . Urinary tract infection, site not specified     Past Surgical History:  Procedure Laterality Date  . ABDOMINAL HYSTERECTOMY  1975  . ANGIOPLASTY  1984 and 1982  . APPENDECTOMY  1954  . ARCH AORTOGRAM  03/30/2011   40% recurrent stenosis at origin of R proximal carotid patch, shelf like recurrent stenosis w/in midsection of patch that does not appear to be flow limiting; normal non-stenosed great vessel origins  . BACK SURGERY  '84,'86, '87   three previous surgeries  . CAROTID ENDARTERECTOMY  2001   Bilateral  . CORONARY ARTERY BYPASS GRAFT  12/04/2012   Procedure: CORONARY ARTERY BYPASS GRAFTING (CABG);  Surgeon: Ivin Poot, MD; LIMA-LAD, SVG-OM, SVG-RCA  . INTRAOPERATIVE TRANSESOPHAGEAL ECHOCARDIOGRAM  12/04/2012   Procedure: INTRAOPERATIVE TRANSESOPHAGEAL ECHOCARDIOGRAM;  Surgeon: Ivin Poot, MD;  Location: [place];  Service: Open Heart Surgery;  Laterality: N/A;  . KIDNEY STONE SURGERY     Removal  . LEFT HEART CATHETERIZATION WITH CORONARY ANGIOGRAM N/A 11/28/2012    Procedure: LEFT HEART CATHETERIZATION WITH CORONARY ANGIOGRAM;  Surgeon: Leonie Man, MD;  Location: [facility] C[facility] LAB;  Service: Cardiovascular;  Laterality: N/A;  . PERCUTANEOUS CORONARY STENT INTERVENTION (PCI-S)  2002   2 BMS in ostial/Prox & mid RCA (mid 2.5 mm 20x  mm, & 2.75 mm x  8 mm ostial)  . RENAL ANGIOGRAM N/A 11/30/2013   Procedure: RENAL ANGIOGRAM;  Surgeon: Lorretta Harp, MD;  Location: [facility] CATH LAB;  Service: Cardiovascular;  Laterality: N/A;  . RIGHT HEART CATHETERIZATION N/A 12/02/2012   Procedure: RIGHT HEART CATH;  Surgeon: Troy Sine, MD;  Location: [facility] CATH LAB;  Service: Cardiovascular;  Laterality: N/A;  . TEE WITH CARDIOVERSION  01/23/2011   EF 65-78%; grade 2 diastolic dysfunction, elevated mean LA filling pressure, RV systolic pressure  increased consistent w/ mod pulmonary hypertension    Allergies  Allergen Reactions  . Iron Hives  . Vicodin [Hydrocodone-Acetaminophen] Other (See Comments)    Caused arrythmia   . Amiodarone Nausea And Vomiting  . Norvasc [Amlodipine Besylate] Nausea And Vomiting  . Promethazine Other (See Comments)    hallucinations  . Vioxx [Rofecoxib] Nausea And Vomiting    Outpatient Encounter Medications as of 07/10/2018  Medication Sig  . alendronate (FOSAMAX) 70 MG tablet Take 70 mg by mouth once a week.   [name] aspirin 81 MG tablet Take 81 mg by mouth daily.   . carvedilol (COREG) 25 MG tablet TAKE ONE-HALF (1/2) TABLET TWICE A DAY WITH MEALS (NEEDS OFFICE VISIT)  . furosemide (LASIX) 20 MG tablet Take 20 mg by mouth as needed.  . hydrALAZINE (APRESOLINE) 25 MG tablet Take 1 tablet (25 mg total) by mouth 3 (three) times daily.  . metFORMIN (GLUCOPHAGE) 1000 MG tablet TAKE 1 TABLET TWICE A DAY WITH MEALS  . nitroGLYCERIN (NITROSTAT) 0.4 MG SL tablet Place 1 tablet (0.4 mg total) under the tongue every 5 (five) minutes as needed for chest pain.  . rosuvastatin (CRESTOR) 40 MG tablet Take one tablet by mouth once daily for cholesterol  .  traMADol (ULTRAM) 50 MG tablet Take 1 tablet (50 mg total) by mouth every 8 (eight) hours as needed.  . ezetimibe (ZETIA) 10 MG tablet Take 1 tablet (10 mg total) by mouth daily.  . [DISCONTINUED] LUMIGAN 0.01 % SOLN Place 1 drop into both eyes at bedtime.    No facility-administered encounter medications on file as of 07/10/2018.     Review of Systems:  Review of Systems  Constitutional: Positive for malaise/fatigue. Negative for chills and fever.  HENT: Negative for congestion.   Eyes: Negative for blurred vision.  Respiratory: Negative for cough and shortness of breath.   Cardiovascular: Negative for chest pain, palpitations and leg swelling.  Gastrointestinal: Positive for abdominal pain and melena. Negative for blood in stool, constipation, diarrhea, heartburn, nausea and vomiting.  Genitourinary: Positive for frequency. Negative for dysuria, flank pain, hematuria and urgency.  Musculoskeletal: Positive for myalgias. Negative for falls and joint pain.       Over left posterior shoulder  Skin: Negative for itching and rash.  Neurological: Positive for weakness.  Endo/Heme/Allergies: Does not bruise/bleed easily.  Psychiatric/Behavioral: Negative for depression and memory loss. The patient is not nervous/anxious and does not have insomnia.     Health Maintenance  Topic Date Due  . OPHTHALMOLOGY EXAM  10/03/2018  . HEMOGLOBIN A1C  12/04/2018  . FOOT EXAM  01/24/2019  . TETANUS/TDAP  11/13/2021  . DEXA SCAN  Completed  . PNA vac Low Risk Adult  Completed  . INFLUENZA VACCINE  Discontinued    Physical Exam: Vitals:   07/10/18 0954  BP: (!) 180/90  Pulse: 63  Temp: 98.6 F (37 C)  TempSrc: Oral  SpO2: 96%  Weight: 152 lb (68.9 kg)  Height: 5\' 2"  (1.575 m)   Body mass index is 27.8 kg/m. Physical Exam  Constitutional: She is oriented to person, place, and time. She appears well-developed and well-nourished. No distress.  Cardiovascular: Intact distal pulses.  irreg  irreg  Pulmonary/Chest: Effort normal and breath sounds normal. No respiratory distress.  Abdominal: Soft. Bowel sounds are normal. She exhibits no distension and no mass. There is no tenderness. There is no rebound and no guarding.  Musculoskeletal: Normal range of motion. She exhibits tenderness.  Over left posterior scapula  Neurological: She is alert and oriented to person, place, and time. No cranial nerve deficit.  Skin: Skin is warm and dry. Capillary refill takes less than 2 seconds.  Psychiatric: She has a normal mood and affect.    Labs reviewed: Basic Metabolic Panel: Recent Labs    02/02/18 2024 02/03/18 0355 06/03/18 0822  NA 134* 134* 138  K 4.8 4.5 4.5  CL 99* 100* 105  CO2 22 23 26   GLUCOSE 97 124* 97  BUN 17 18 32*  CREATININE 1.46* 1.52* 1.57*  CALCIUM 9.9 9.1 9.3   Liver Function Tests: Recent Labs    12/24/17 0824 06/03/18 0822  AST 16 14  ALT 7 6  BILITOT 0.5 0.7  PROT 6.5 6.1   No results for input(s): LIPASE, AMYLASE in the last 8760 hours. No results for input(s): AMMONIA in the last 8760 hours. CBC: Recent Labs    12/24/17 0824 02/02/18 2024 02/03/18 0355 06/03/18 0822  WBC 5.2 5.9 6.4 4.9  NEUTROABS 3,120  --   --  2,886  HGB 10.4* 10.9* 9.9* 8.7*  HCT 32.8* 34.9* 31.3* 28.1*  MCV 78.5* 79.3 79.4 80.5  PLT 184 272 237 232   Lipid Panel: Recent Labs    12/24/17 0824 06/03/18 0822  CHOL 171 147  HDL 51 45*  LDLCALC 103* 86  TRIG 81 69  CHOLHDL 3.4 3.3   Lab Results  Component Value Date   HGBA1C 5.9 (H) 06/03/2018    Assessment/Plan 1. Iron deficiency anemia, unspecified iron deficiency anemia type - progressive--? Due to GI bleeding due to her melena she has noted (had bleeding more significantly on NOAC for afib so it was stopped), role of her progressive renal failure also - CBC with Differential/Platelet - Iron, TIBC and Ferritin Panel - CBC with Differential/Platelet; Future  2. CKD (chronic kidney disease) stage  3, GFR 30-59 ml/min (HCC) - following with Dr. Hollie Salk and there are plans for pt to see Dr. Gwenlyn Found to consider if she needs renal artery stenosis addressed with angioplasty or stenting - CBC with Differential/Platelet - Iron, TIBC and Ferritin Panel - COMPLETE METABOLIC PANEL WITH GFR; Future  3. Type 2 diabetes mellitus with stage 3 chronic kidney disease, without long-term current use of insulin (HCC) - improved hba1c but quite anemic so not reliable - Hemoglobin A1c; Future  4. Other fatigue - due to anemia - CBC with Differential/Platelet - Iron, TIBC and Ferritin Panel  5. Melena - notes dark stools and dropping h/h, has GI appt coming up and likely needs cscope - CBC with Differential/Platelet - Iron, TIBC and Ferritin Panel  6. Coronary artery disease involving native coronary artery of native heart without angina pectoris - cont secondary prevention -lipid control has improved  - Lipid panel; Future  7. Labile blood pressure -likely from renal a stenosis -has been a progressively worsening problem and cardiology, myself and nephrology have been adjusting meds to no avail  Labs/tests ordered:   Orders Placed This Encounter  Procedures  . CBC with Differential/Platelet  . Iron, TIBC and Ferritin Panel  . CBC with Differential/Platelet    Standing Status:   Future    Standing Expiration Date:   03/10/2019  . COMPLETE METABOLIC PANEL WITH GFR    Standing Status:   Future    Standing Expiration Date:   03/10/2019  . Lipid panel    Standing Status:   Future    Standing Expiration Date:   03/10/2019  . Hemoglobin A1c  Standing Status:   Future    Standing Expiration Date:   03/10/2019   Next appt:  12/31/2018   Wardell Pokorski L. Nieko Clarin, D.O. [facility] [address], [place] [address] Cell Phone (Mon-Fri 8am-5pm):  [phone] On Call:  [phone] & follow prompts after 5pm & weekends Office Phone:  [phone] Office  Fax:  [phone]

## 2018-07-15 ENCOUNTER — Encounter: Payer: Self-pay | Admitting: Cardiovascular Disease

## 2018-07-15 ENCOUNTER — Ambulatory Visit (INDEPENDENT_AMBULATORY_CARE_PROVIDER_SITE_OTHER): Payer: Medicare Other | Admitting: Cardiovascular Disease

## 2018-07-15 VITALS — BP 198/78 | HR 79 | Ht 62.0 in | Wt 154.0 lb

## 2018-07-15 DIAGNOSIS — Z01812 Encounter for preprocedural laboratory examination: Secondary | ICD-10-CM | POA: Diagnosis not present

## 2018-07-15 DIAGNOSIS — I701 Atherosclerosis of renal artery: Secondary | ICD-10-CM | POA: Diagnosis not present

## 2018-07-15 NOTE — Assessment & Plan Note (Signed)
Kendra Todd was referred back to me by Dr. [name] for evaluation of resistant hypertension and bilateral renal artery stenosis.  I performed renal angiography on her 11/30/2013 revealing at most 40 to 50% bilateral renal artery stenosis suggesting that her Doppler studies were falsely positive.  She is on multiple antihypertensive medications with moderate renal insufficiency and a serum creatinine of 1.57 followed by Dr. Hollie Salk.  Blood pressures are labile and difficult to control with recent Dopplers performed a week ago that suggested high-grade right renal artery stenosis, moderate left.  We discussed options and she is agreed to renal angiography with potential intervention for treatment of renal vascular hypertension.

## 2018-07-15 NOTE — Progress Notes (Signed)
Dr. Hollie Salk suggested trying to do a CO2 angio, if appropriate? Kendra Todd

## 2018-07-15 NOTE — Patient Instructions (Addendum)
Medication Instructions:  Your physician recommends that you continue on your current medications as directed. Please refer to the Current Medication list given to you today.   Labwork: Your physician recommends that you return for lab work in: [place].   Testing/Procedures: See letter for details on your scheduled renal angiogram   Follow-Up: Your physician recommends that you schedule a follow-up appointment in: 4 weeks after procedure (07/21/2018)   Any Other Special Instructions Will Be Listed Below (If Applicable).     If you need a refill on your cardiac medications before your next appointment, please call your pharmacy.

## 2018-07-15 NOTE — Progress Notes (Signed)
07/15/2018 Kendra Todd   August 25, 1941  [id number]  Primary Physician [name], DO Primary Cardiologist: Lorretta Harp MD Kendra Todd, [place], [place]  HPI:  Kendra Todd is a 77 y.o.  African American female patient of Dr. Sallyanne Todd who I last saw in the office 01/08/2014.  She is accompanied by her daughter Kendra Todd today.  She has a history of CAD status post coronary artery bypass grafting last year. She has a history of carotid artery disease status post bilateral carotid endarterectomies in the past as well as hypertension. She had renal Dopplers performed in office in November that suggested bilateral renal artery stenosis possibly contributing to her resistant hypertension. Because of this she underwent peripheral angiography revealing only moderate bilateral renal artery stenosis. This suggested the Dopplers of her estimated stenosis and her hypertension was most likely essential.  Since I saw her 4 years ago her blood pressure has become more labile and difficult to control.  She has moderate renal insufficiency.  Recent renal Dopplers suggested high-grade right renal artery stenosis with preserved renal dimensions.  Current Meds  Medication Sig  . alendronate (FOSAMAX) 70 MG tablet Take 70 mg by mouth once a week.   [name] aspirin 81 MG tablet Take 81 mg by mouth daily.   . carvedilol (COREG) 25 MG tablet TAKE ONE-HALF (1/2) TABLET TWICE A DAY WITH MEALS (NEEDS OFFICE VISIT)  . ezetimibe (ZETIA) 10 MG tablet Take 10 mg by mouth daily.  . furosemide (LASIX) 20 MG tablet Take 20 mg by mouth as needed.  . hydrALAZINE (APRESOLINE) 25 MG tablet Take 1 tablet (25 mg total) by mouth 3 (three) times daily.  . metFORMIN (GLUCOPHAGE) 1000 MG tablet TAKE 1 TABLET TWICE A DAY WITH MEALS  . nitroGLYCERIN (NITROSTAT) 0.4 MG SL tablet Place 1 tablet (0.4 mg total) under the tongue every 5 (five) minutes as needed for chest pain.  . rosuvastatin (CRESTOR) 40 MG tablet Take one tablet by mouth once  daily for cholesterol  . traMADol (ULTRAM) 50 MG tablet Take 1 tablet (50 mg total) by mouth every 8 (eight) hours as needed.     Allergies  Allergen Reactions  . Iron Hives  . Vicodin [Hydrocodone-Acetaminophen] Other (See Comments)    Caused arrythmia   . Amiodarone Nausea And Vomiting  . Norvasc [Amlodipine Besylate] Nausea And Vomiting  . Promethazine Other (See Comments)    hallucinations  . Vioxx [Rofecoxib] Nausea And Vomiting    Social History   Socioeconomic History  . Marital status: Widowed    Spouse name: Not on file  . Number of children: Not on file  . Years of education: Not on file  . Highest education level: Not on file  Occupational History  . Not on file  Social Needs  . Financial resource strain: Not hard at all  . Food insecurity:    Worry: Never true    Inability: Never true  . Transportation needs:    Medical: No    Non-medical: No  Tobacco Use  . Smoking status: Former Smoker    Packs/day: 0.30    Years: 57.00    Pack years: 17.10    Types: Cigarettes    Last attempt to quit: 11/19/2012    Years since quitting: 5.6  . Smokeless tobacco: Never Used  Substance and Sexual Activity  . Alcohol use: Yes    Comment: rarely, 1 a month  . Drug use: No  . Sexual activity: Never  Lifestyle  .  Physical activity:    Days per week: 0 days    Minutes per session: 0 min  . Stress: Only a little  Relationships  . Social connections:    Talks on phone: More than three times a week    Gets together: More than three times a week    Attends religious service: More than 4 times per year    Active member of club or organization: Yes    Attends meetings of clubs or organizations: More than 4 times per year    Relationship status: Widowed  . Intimate partner violence:    Fear of current or ex partner: No    Emotionally abused: No    Physically abused: No    Forced sexual activity: No  Other Topics Concern  . Not on file  Social History Narrative  .  Not on file     Review of Systems: General: negative for chills, fever, night sweats or weight changes.  Cardiovascular: negative for chest pain, dyspnea on exertion, edema, orthopnea, palpitations, paroxysmal nocturnal dyspnea or shortness of breath Dermatological: negative for rash Respiratory: negative for cough or wheezing Urologic: negative for hematuria Abdominal: negative for nausea, vomiting, diarrhea, bright red blood per rectum, melena, or hematemesis Neurologic: negative for visual changes, syncope, or dizziness All other systems reviewed and are otherwise negative except as noted above.    Blood pressure (!) 198/78, pulse 79, height 5\' 2"  (1.575 m), weight 154 lb (69.9 kg), SpO2 98 %.  General appearance: alert and no distress Neck: no adenopathy, no JVD, supple, symmetrical, trachea midline, thyroid not enlarged, symmetric, no tenderness/mass/nodules and soft bilateral carotid bruits Lungs: clear to auscultation bilaterally Heart: regular rate and rhythm, S1, S2 normal, no murmur, click, rub or gallop Extremities: extremities normal, atraumatic, no cyanosis or edema Pulses: 2+ and symmetric Skin: Skin color, texture, turgor normal. No rashes or lesions Neurologic: Alert and oriented X 3, normal strength and tone. Normal symmetric reflexes. Normal coordination and gait  EKG not performed today  ASSESSMENT AND PLAN:   Bilateral renal artery stenosis [facility]) Ms. Kendra Todd was referred back to me by Dr. Sallyanne Todd for evaluation of resistant hypertension and bilateral renal artery stenosis.  I performed renal angiography on her 11/30/2013 revealing at most 40 to 50% bilateral renal artery stenosis suggesting that her Doppler studies were falsely positive.  She is on multiple antihypertensive medications with moderate renal insufficiency and a serum creatinine of 1.57 followed by Dr. Hollie Salk.  Blood pressures are labile and difficult to control with recent Dopplers performed a week ago that  suggested high-grade right renal artery stenosis, moderate left.  We discussed options and she is agreed to renal angiography with potential intervention for treatment of renal vascular hypertension.      Lorretta Harp MD FACP,FACC,FAHA, [facility] 07/15/2018 11:36 AM

## 2018-07-16 LAB — CBC WITH DIFFERENTIAL/PLATELET
BASOS: 1 %
Basophils Absolute: 0.1 10*3/uL (ref 0.0–0.2)
EOS (ABSOLUTE): 0.1 10*3/uL (ref 0.0–0.4)
EOS: 2 %
HEMOGLOBIN: 9.7 g/dL — AB (ref 11.1–15.9)
Hematocrit: 32.2 % — ABNORMAL LOW (ref 34.0–46.6)
IMMATURE GRANS (ABS): 0 10*3/uL (ref 0.0–0.1)
IMMATURE GRANULOCYTES: 0 %
LYMPHS: 23 %
Lymphocytes Absolute: 1.2 10*3/uL (ref 0.7–3.1)
MCH: 24.2 pg — ABNORMAL LOW (ref 26.6–33.0)
MCHC: 30.1 g/dL — ABNORMAL LOW (ref 31.5–35.7)
MCV: 80 fL (ref 79–97)
MONOCYTES: 16 %
Monocytes Absolute: 0.9 10*3/uL (ref 0.1–0.9)
NEUTROS PCT: 58 %
Neutrophils Absolute: 3.1 10*3/uL (ref 1.4–7.0)
PLATELETS: 210 10*3/uL (ref 150–450)
RBC: 4.01 x10E6/uL (ref 3.77–5.28)
RDW: 15.2 % (ref 12.3–15.4)
WBC: 5.4 10*3/uL (ref 3.4–10.8)

## 2018-07-16 LAB — BASIC METABOLIC PANEL
BUN/Creatinine Ratio: 15 (ref 12–28)
BUN: 23 mg/dL (ref 8–27)
CALCIUM: 9.2 mg/dL (ref 8.7–10.3)
CO2: 22 mmol/L (ref 20–29)
CREATININE: 1.52 mg/dL — AB (ref 0.57–1.00)
Chloride: 103 mmol/L (ref 96–106)
GFR calc non Af Amer: 32 mL/min/{1.73_m2} — ABNORMAL LOW (ref >59)
GFR, EST AFRICAN AMERICAN: 37 mL/min/{1.73_m2} — AB (ref >59)
Glucose: 81 mg/dL (ref 65–99)
Potassium: 4.8 mmol/L (ref 3.5–5.2)
Sodium: 142 mmol/L (ref 134–144)

## 2018-07-16 LAB — TSH: TSH: 2.33 u[IU]/mL (ref 0.450–4.500)

## 2018-07-21 ENCOUNTER — Other Ambulatory Visit: Payer: Self-pay

## 2018-07-21 ENCOUNTER — Ambulatory Visit (HOSPITAL_COMMUNITY)
Admission: RE | Admit: 2018-07-21 | Discharge: 2018-07-22 | Disposition: A | Discharge status: Home / Self Care | Payer: Medicare Other | Source: Ambulatory Visit | Attending: Cardiovascular Disease | Admitting: Cardiovascular Disease

## 2018-07-21 ENCOUNTER — Encounter (HOSPITAL_COMMUNITY)
Admission: RE | Disposition: A | Discharge status: Home / Self Care | Payer: Self-pay | Source: Ambulatory Visit | Attending: Cardiovascular Disease

## 2018-07-21 ENCOUNTER — Encounter (HOSPITAL_COMMUNITY): Payer: Self-pay | Admitting: Cardiovascular Disease

## 2018-07-21 DIAGNOSIS — I130 Hypertensive heart and chronic kidney disease with heart failure and stage 1 through stage 4 chronic kidney disease, or unspecified chronic kidney disease: Secondary | ICD-10-CM | POA: Insufficient documentation

## 2018-07-21 DIAGNOSIS — I2721 Secondary pulmonary arterial hypertension: Secondary | ICD-10-CM | POA: Insufficient documentation

## 2018-07-21 DIAGNOSIS — I701 Atherosclerosis of renal artery: Secondary | ICD-10-CM | POA: Diagnosis not present

## 2018-07-21 DIAGNOSIS — N183 Chronic kidney disease, stage 3 (moderate): Secondary | ICD-10-CM | POA: Diagnosis not present

## 2018-07-21 DIAGNOSIS — E785 Hyperlipidemia, unspecified: Secondary | ICD-10-CM | POA: Insufficient documentation

## 2018-07-21 DIAGNOSIS — Z7982 Long term (current) use of aspirin: Secondary | ICD-10-CM | POA: Diagnosis not present

## 2018-07-21 DIAGNOSIS — I5032 Chronic diastolic (congestive) heart failure: Secondary | ICD-10-CM | POA: Diagnosis not present

## 2018-07-21 DIAGNOSIS — I150 Renovascular hypertension: Secondary | ICD-10-CM | POA: Diagnosis not present

## 2018-07-21 DIAGNOSIS — I480 Paroxysmal atrial fibrillation: Secondary | ICD-10-CM | POA: Diagnosis not present

## 2018-07-21 DIAGNOSIS — Z951 Presence of aortocoronary bypass graft: Secondary | ICD-10-CM | POA: Diagnosis not present

## 2018-07-21 DIAGNOSIS — I2510 Atherosclerotic heart disease of native coronary artery without angina pectoris: Secondary | ICD-10-CM | POA: Diagnosis not present

## 2018-07-21 DIAGNOSIS — I700 Atherosclerosis of aorta: Secondary | ICD-10-CM | POA: Diagnosis not present

## 2018-07-21 DIAGNOSIS — Z8673 Personal history of transient ischemic attack (TIA), and cerebral infarction without residual deficits: Secondary | ICD-10-CM | POA: Insufficient documentation

## 2018-07-21 DIAGNOSIS — Z885 Allergy status to narcotic agent status: Secondary | ICD-10-CM | POA: Diagnosis not present

## 2018-07-21 HISTORY — PX: PERIPHERAL VASCULAR INTERVENTION: CATH118257

## 2018-07-21 HISTORY — PX: RENAL ANGIOGRAPHY: CATH118260

## 2018-07-21 LAB — POCT ACTIVATED CLOTTING TIME
ACTIVATED CLOTTING TIME: 246 s
ACTIVATED CLOTTING TIME: 169 s
Activated Clotting Time: 290 seconds

## 2018-07-21 LAB — GLUCOSE, CAPILLARY
GLUCOSE-CAPILLARY: 117 mg/dL — AB (ref 70–99)
Glucose-Capillary: 123 mg/dL — ABNORMAL HIGH (ref 70–99)
Glucose-Capillary: 103 mg/dL — ABNORMAL HIGH (ref 70–99)
Glucose-Capillary: 127 mg/dL — ABNORMAL HIGH (ref 70–99)

## 2018-07-21 SURGERY — RENAL ANGIOGRAPHY
Anesthesia: LOCAL

## 2018-07-21 MED ORDER — HYDRALAZINE HCL 25 MG PO TABS
25.0000 mg | ORAL_TABLET | Freq: Three times a day (TID) | ORAL | Status: DC
  Administered 2018-07-21 – 2018-07-22 (×3): 25 mg via ORAL
  Filled 2018-07-21 (×3): qty 1

## 2018-07-21 MED ORDER — FENTANYL CITRATE (PF) 100 MCG/2ML IJ SOLN
INTRAMUSCULAR | Status: AC
  Filled 2018-07-21: qty 2

## 2018-07-21 MED ORDER — CLOPIDOGREL BISULFATE 75 MG PO TABS
75.0000 mg | ORAL_TABLET | Freq: Every day | ORAL | Status: DC
  Administered 2018-07-22: 10:00:00 75 mg via ORAL
  Filled 2018-07-21: qty 1

## 2018-07-21 MED ORDER — NITROGLYCERIN 0.4 MG SL SUBL: 0.4000 mg | SUBLINGUAL_TABLET | SUBLINGUAL | Status: DC | PRN

## 2018-07-21 MED ORDER — CARVEDILOL 12.5 MG PO TABS
12.5000 mg | ORAL_TABLET | Freq: Two times a day (BID) | ORAL | Status: DC
  Administered 2018-07-21 – 2018-07-22 (×3): 12.5 mg via ORAL
  Filled 2018-07-21 (×3): qty 1

## 2018-07-21 MED ORDER — LIDOCAINE HCL (PF) 1 % IJ SOLN
INTRAMUSCULAR | Status: DC | PRN
  Administered 2018-07-21: 30 mL

## 2018-07-21 MED ORDER — ASPIRIN EC 81 MG PO TBEC: 81.0000 mg | DELAYED_RELEASE_TABLET | Freq: Every day | ORAL | Status: DC

## 2018-07-21 MED ORDER — ROSUVASTATIN CALCIUM 20 MG PO TABS
40.0000 mg | ORAL_TABLET | Freq: Every day | ORAL | Status: DC
  Administered 2018-07-21: 22:00:00 40 mg via ORAL
  Filled 2018-07-21: qty 2

## 2018-07-21 MED ORDER — SODIUM CHLORIDE 0.9 % WEIGHT BASED INFUSION
3.0000 mL/kg/h | INTRAVENOUS | Status: DC
  Administered 2018-07-21: 3 mL/kg/h via INTRAVENOUS

## 2018-07-21 MED ORDER — ASPIRIN EC 81 MG PO TBEC
81.0000 mg | DELAYED_RELEASE_TABLET | Freq: Every day | ORAL | Status: DC
  Administered 2018-07-22: 81 mg via ORAL
  Filled 2018-07-21: qty 1

## 2018-07-21 MED ORDER — HEPARIN (PORCINE) IN NACL 1000-0.9 UT/500ML-% IV SOLN
INTRAVENOUS | Status: AC
  Filled 2018-07-21: qty 1000

## 2018-07-21 MED ORDER — SODIUM CHLORIDE 0.9% FLUSH: 3.0000 mL | INTRAVENOUS | Status: DC | PRN

## 2018-07-21 MED ORDER — ANGIOPLASTY BOOK
Freq: Once | Status: AC
  Administered 2018-07-21: 22:00:00 1
  Filled 2018-07-21: qty 1

## 2018-07-21 MED ORDER — HEPARIN SODIUM (PORCINE) 1000 UNIT/ML IJ SOLN
INTRAMUSCULAR | Status: DC | PRN
  Administered 2018-07-21: 7500 [IU] via INTRAVENOUS

## 2018-07-21 MED ORDER — HYDRALAZINE HCL 20 MG/ML IJ SOLN
INTRAMUSCULAR | Status: AC
  Filled 2018-07-21: qty 1

## 2018-07-21 MED ORDER — SODIUM CHLORIDE 0.9 % IV SOLN: 250.0000 mL | INTRAVENOUS | Status: DC | PRN

## 2018-07-21 MED ORDER — SODIUM CHLORIDE 0.9% FLUSH: 3.0000 mL | Freq: Two times a day (BID) | INTRAVENOUS | Status: DC

## 2018-07-21 MED ORDER — CLOPIDOGREL BISULFATE 300 MG PO TABS
ORAL_TABLET | ORAL | Status: DC | PRN
  Administered 2018-07-21: 300 mg via ORAL

## 2018-07-21 MED ORDER — LIDOCAINE HCL (PF) 1 % IJ SOLN
INTRAMUSCULAR | Status: AC
  Filled 2018-07-21: qty 30

## 2018-07-21 MED ORDER — MIDAZOLAM HCL 2 MG/2ML IJ SOLN
INTRAMUSCULAR | Status: AC
  Filled 2018-07-21: qty 2

## 2018-07-21 MED ORDER — CLOPIDOGREL BISULFATE 300 MG PO TABS
ORAL_TABLET | ORAL | Status: AC
  Filled 2018-07-21: qty 1

## 2018-07-21 MED ORDER — HYDRALAZINE HCL 20 MG/ML IJ SOLN
10.0000 mg | Freq: Once | INTRAMUSCULAR | Status: AC
  Administered 2018-07-21: 10 mg via INTRAVENOUS

## 2018-07-21 MED ORDER — FUROSEMIDE 20 MG PO TABS: 20.0000 mg | ORAL_TABLET | Freq: Every day | ORAL | Status: DC | PRN

## 2018-07-21 MED ORDER — ASPIRIN 81 MG PO CHEW: 81.0000 mg | CHEWABLE_TABLET | ORAL | Status: DC

## 2018-07-21 MED ORDER — ONDANSETRON HCL 4 MG/2ML IJ SOLN: 4.0000 mg | Freq: Four times a day (QID) | INTRAMUSCULAR | Status: DC | PRN

## 2018-07-21 MED ORDER — HYDRALAZINE HCL 20 MG/ML IJ SOLN: 5.0000 mg | INTRAMUSCULAR | Status: DC | PRN

## 2018-07-21 MED ORDER — TRAMADOL HCL 50 MG PO TABS
50.0000 mg | ORAL_TABLET | Freq: Three times a day (TID) | ORAL | Status: DC | PRN
  Administered 2018-07-21 (×2): 50 mg via ORAL
  Filled 2018-07-21 (×2): qty 1

## 2018-07-21 MED ORDER — IODIXANOL 320 MG/ML IV SOLN
INTRAVENOUS | Status: DC | PRN
  Administered 2018-07-21: 50 mL via INTRAVENOUS

## 2018-07-21 MED ORDER — FENTANYL CITRATE (PF) 100 MCG/2ML IJ SOLN
INTRAMUSCULAR | Status: DC | PRN
  Administered 2018-07-21: 25 ug via INTRAVENOUS

## 2018-07-21 MED ORDER — MIDAZOLAM HCL 2 MG/2ML IJ SOLN
INTRAMUSCULAR | Status: DC | PRN
  Administered 2018-07-21: 1 mg via INTRAVENOUS

## 2018-07-21 MED ORDER — HYDROCHLOROTHIAZIDE 25 MG PO TABS
25.0000 mg | ORAL_TABLET | Freq: Every day | ORAL | Status: DC
  Administered 2018-07-21 – 2018-07-22 (×2): 25 mg via ORAL
  Filled 2018-07-21 (×2): qty 1

## 2018-07-21 MED ORDER — SODIUM CHLORIDE 0.9 % IV SOLN
INTRAVENOUS | Status: AC
  Administered 2018-07-21: 14:00:00 via INTRAVENOUS

## 2018-07-21 MED ORDER — SODIUM CHLORIDE 0.9% FLUSH
3.0000 mL | Freq: Two times a day (BID) | INTRAVENOUS | Status: DC
  Administered 2018-07-21 – 2018-07-22 (×2): 3 mL via INTRAVENOUS

## 2018-07-21 MED ORDER — SODIUM CHLORIDE 0.9 % WEIGHT BASED INFUSION: 1.0000 mL/kg/h | INTRAVENOUS | Status: DC

## 2018-07-21 MED ORDER — EZETIMIBE 10 MG PO TABS
10.0000 mg | ORAL_TABLET | Freq: Every day | ORAL | Status: DC
  Administered 2018-07-21: 10 mg via ORAL
  Filled 2018-07-21: qty 1

## 2018-07-21 MED ORDER — HYDRALAZINE HCL 20 MG/ML IJ SOLN
INTRAMUSCULAR | Status: DC | PRN
  Administered 2018-07-21: 10 mg via INTRAVENOUS

## 2018-07-21 MED ORDER — LABETALOL HCL 5 MG/ML IV SOLN
10.0000 mg | INTRAVENOUS | Status: DC | PRN
  Administered 2018-07-21: 10 mg via INTRAVENOUS
  Filled 2018-07-21: qty 4

## 2018-07-21 MED ORDER — HEPARIN (PORCINE) IN NACL 1000-0.9 UT/500ML-% IV SOLN
INTRAVENOUS | Status: DC | PRN
  Administered 2018-07-21 (×2): 500 mL

## 2018-07-21 SURGICAL SUPPLY — 21 items
BALLN EMERGE MR 3.0X20 (BALLOONS) ×3
BALLOON EMERGE MR 3.0X20 (BALLOONS) ×2 IMPLANT
CATH ANGIO 5F PIGTAIL 65CM (CATHETERS) ×3 IMPLANT
DEVICE TORQUE .014-.018 (MISCELLANEOUS) ×2 IMPLANT
FILTER CO2 0.2 MICRON (VASCULAR PRODUCTS) ×3 IMPLANT
GUIDE CATH VISTA JR4 6F (CATHETERS) ×3 IMPLANT
KIT ENCORE 26 ADVANTAGE (KITS) ×3 IMPLANT
KIT PV (KITS) ×3 IMPLANT
RESERVOIR CO2 (VASCULAR PRODUCTS) ×3 IMPLANT
SET FLUSH CO2 (MISCELLANEOUS) ×3 IMPLANT
SHEATH PINNACLE 6F 10CM (SHEATH) ×3 IMPLANT
SHEATH PROBE COVER 6X72 (BAG) ×3 IMPLANT
STENT HERCULINK RX 5.0X15X135 (Permanent Stent) ×3 IMPLANT
STENT HERCULINK RX 5.5X15X135 (Permanent Stent) ×3 IMPLANT
SYR MEDRAD MARK V 150ML (SYRINGE) ×3 IMPLANT
TORQUE DEVICE .014-.018 (MISCELLANEOUS) ×3
TRANSDUCER W/STOPCOCK (MISCELLANEOUS) ×3 IMPLANT
TRAY PV CATH (CUSTOM PROCEDURE TRAY) ×3 IMPLANT
TUBING CIL FLEX 10 FLL-RA (TUBING) ×3 IMPLANT
WIRE HITORQ VERSACORE ST 145CM (WIRE) ×3 IMPLANT
WIRE STABILIZER XS .014X180CM (WIRE) ×3 IMPLANT

## 2018-07-21 NOTE — Progress Notes (Signed)
Site area: right groin  Site Prior to Removal:  Level 0  Pressure Applied For 20 MINUTES    Minutes Beginning at 1540  Manual:   Yes.    Patient Status During Pull:  WNL  Post Pull Groin Site:  Level 0  Post Pull Instructions Given:  Yes.    Post Pull Pulses Present:  Yes.    Dressing Applied:  Yes.    CommentS: Tolerated procedure well

## 2018-07-22 DIAGNOSIS — N183 Chronic kidney disease, stage 3 (moderate): Secondary | ICD-10-CM | POA: Diagnosis not present

## 2018-07-22 DIAGNOSIS — I701 Atherosclerosis of renal artery: Secondary | ICD-10-CM

## 2018-07-22 DIAGNOSIS — I2721 Secondary pulmonary arterial hypertension: Secondary | ICD-10-CM | POA: Diagnosis not present

## 2018-07-22 DIAGNOSIS — I130 Hypertensive heart and chronic kidney disease with heart failure and stage 1 through stage 4 chronic kidney disease, or unspecified chronic kidney disease: Secondary | ICD-10-CM | POA: Diagnosis not present

## 2018-07-22 DIAGNOSIS — I700 Atherosclerosis of aorta: Secondary | ICD-10-CM | POA: Diagnosis not present

## 2018-07-22 DIAGNOSIS — I5032 Chronic diastolic (congestive) heart failure: Secondary | ICD-10-CM | POA: Diagnosis not present

## 2018-07-22 LAB — GLUCOSE, CAPILLARY
GLUCOSE-CAPILLARY: 125 mg/dL — AB (ref 70–99)
GLUCOSE-CAPILLARY: 126 mg/dL — AB (ref 70–99)

## 2018-07-22 LAB — CBC
HCT: 26.3 % — ABNORMAL LOW (ref 36.0–46.0)
HEMOGLOBIN: 7.8 g/dL — AB (ref 12.0–15.0)
MCH: 24.2 pg — ABNORMAL LOW (ref 26.0–34.0)
MCHC: 29.7 g/dL — ABNORMAL LOW (ref 30.0–36.0)
MCV: 81.7 fL (ref 78.0–100.0)
PLATELETS: 161 10*3/uL (ref 150–400)
RBC: 3.22 MIL/uL — ABNORMAL LOW (ref 3.87–5.11)
RDW: 16.8 % — ABNORMAL HIGH (ref 11.5–15.5)
WBC: 5.9 10*3/uL (ref 4.0–10.5)

## 2018-07-22 LAB — BASIC METABOLIC PANEL
Anion gap: 7 (ref 5–15)
BUN: 18 mg/dL (ref 8–23)
CALCIUM: 8.5 mg/dL — AB (ref 8.9–10.3)
CO2: 24 mmol/L (ref 22–32)
CREATININE: 1.26 mg/dL — AB (ref 0.44–1.00)
Chloride: 103 mmol/L (ref 98–111)
GFR calc Af Amer: 46 mL/min — ABNORMAL LOW (ref >60)
GFR calc non Af Amer: 39 mL/min — ABNORMAL LOW (ref >60)
Glucose, Bld: 128 mg/dL — ABNORMAL HIGH (ref 70–99)
Potassium: 4.2 mmol/L (ref 3.5–5.1)
Sodium: 134 mmol/L — ABNORMAL LOW (ref 135–145)

## 2018-07-22 MED ORDER — CLOPIDOGREL BISULFATE 75 MG PO TABS: 75.0000 mg | ORAL_TABLET | Freq: Every day | ORAL | 3 refills | Status: DC

## 2018-07-22 NOTE — Discharge Summary (Addendum)
Discharge Summary    Patient ID: Kendra Todd MRN: [id number]; DOB: [birth year]  Admit date: 07/21/2018 Discharge date: 07/22/2018  Primary Care Provider: [name], DO  Primary Cardiologist: [name], MD   Discharge Diagnoses    Active Problems:   Bilateral renal artery stenosis [facility])   Renal artery stenosis (HCC)   Allergies Allergies  Allergen Reactions  . Iron Hives  . Vicodin [Hydrocodone-Acetaminophen] Other (See Comments)    Caused arrythmia   . Amiodarone Nausea And Vomiting  . Norvasc [Amlodipine Besylate] Nausea And Vomiting  . Promethazine Other (See Comments)    hallucinations  . Vioxx [Rofecoxib] Nausea And Vomiting    Diagnostic Studies/Procedures    07/21/18 Procedures Performed:               1.  CO2 abdominal aortogram               2.  Left renal artery PTA and stent               3.  Right renal artery PTA and stent  Angiographic Data:   1: Abdominal aorta- the abdominal aorta was moderately and diffusely atherosclerotic.  There was an 80% right renal artery stenosis.  The left renal artery was not visible with CO2 abdominal aortography.  IMPRESSION: 80% right renal artery stenosis.  We will proceed with intervention.  Final Impression: Successful bilateral renal artery stenting using a total of 50 cc of contrast in the setting of renal vascular hypertension and moderate renal insufficiency.  The sheath will be removed once ACT falls below 170 once the pressure is better controlled.  Patient will be gently hydrated overnight.  Her blood pressure medications will be adjusted accordingly.  She left the lab in stable condition.  Quay Burow. MD, [facility] 07/21/2018 12:51 PM    History of Present Illness     Kendra Todd is a 77 y.o. female with CAD status post CABG, hypertensive heart disease with diastolic HF, severe hypertension, thoracoabdominal aortic aneurysm, recurrent carotid artery disease, history of stroke, hyperlipidemia,  paroxysmal atrial fibrillation and severe pulmonary artery hypertension, chronic kidney disease stage III.  Her blood pressure has become more labile and difficult to control. CT angiogram of the aorta performed on July 24 continues to show marked irregular plaque at the level of the diaphragmatic segment of the aorta (maximum diameter 4.4 cm).  The renal arteries appeared to have probable high-grade ostial stenosis bilaterally but were difficult to visualize. She has moderate renal insufficiency. Recent renal Dopplers suggested high-grade right renal artery stenosis with preserved renal dimensions.   She was arranged to have abdominal aortogram with possible renal artery PTA/Stent.  Hospital Course     Consultants: None  Kendra Todd underwent abdominal aortogram yesterday and found to have moderately and diffusely atherosclerotic abdominal aorta with 80% right renal artery stenosis.  The left renal artery was not visible with CO2 abdominal aortography. She underwent Successful bilateral renal artery stenting using a total of 50 cc of contrast in the setting of renal vascular hypertension and moderate renal insufficiency.  She was gently hydrated overnight.  Creatinine is down to 1.26 today. Blood pressure well controllled today 130/46. She has been up walking in the halls without difficulty. She has been started on Plavix 75 mg daily and will continue on her home BP meds to follow up in the office to reevaluate BP. The patient is aware that she can skip HCTZ if needed. She has lasix prn if  needed for edema.   Patient has been seen by Dr. [name] today and deemed ready for discharge home. All follow up appointments have been scheduled. Discharge medications are listed below. _____________  Discharge Vitals Blood pressure (!) 130/46, pulse (!) 56, temperature 98.4 F (36.9 C), temperature source Oral, resp. rate (!) 22, height 5\' 2"  (1.575 m), weight 70 kg, SpO2 98 %.  Filed Weights   07/21/18  0818 07/22/18 0628  Weight: 68 kg 70 kg   Physical Exam  Constitutional: She is oriented to person, place, and time. She appears well-developed and well-nourished. No distress.  Neck: Normal range of motion. Neck supple. No JVD present. Carotid bruit is present.  Cardiovascular: Normal rate, regular rhythm, normal heart sounds and intact distal pulses. Exam reveals no gallop and no friction rub.  No murmur heard. Pulmonary/Chest: Effort normal and breath sounds normal. No respiratory distress. She has no wheezes. She has no rales.  Abdominal: Soft. Bowel sounds are normal.  Musculoskeletal: Normal range of motion. She exhibits no edema or deformity.  Neurological: She is alert and oriented to person, place, and time.  Skin: Skin is warm and dry.  Psychiatric: She has a normal mood and affect. Her behavior is normal. Judgment and thought content normal.  Vitals reviewed.  Right groin site is soft without hematoma, ecchymosis or bruit.    Labs & Radiologic Studies    CBC Recent Labs    07/22/18 0235  WBC 5.9  HGB 7.8*  HCT 26.3*  MCV 81.7  PLT 696   Basic Metabolic Panel Recent Labs    07/22/18 0235  NA 134*  K 4.2  CL 103  CO2 24  GLUCOSE 128*  BUN 18  CREATININE 1.26*  CALCIUM 8.5*   Liver Function Tests No results for input(s): AST, ALT, ALKPHOS, BILITOT, PROT, ALBUMIN in the last 72 hours. No results for input(s): LIPASE, AMYLASE in the last 72 hours. Cardiac Enzymes No results for input(s): CKTOTAL, CKMB, CKMBINDEX, TROPONINI in the last 72 hours. BNP Invalid input(s): POCBNP D-Dimer No results for input(s): DDIMER in the last 72 hours. Hemoglobin A1C No results for input(s): HGBA1C in the last 72 hours. Fasting Lipid Panel No results for input(s): CHOL, HDL, LDLCALC, TRIG, CHOLHDL, LDLDIRECT in the last 72 hours. Thyroid Function Tests No results for input(s): TSH, T4TOTAL, T3FREE, THYROIDAB in the last 72 hours.  Invalid input(s): FREET3 _____________   No results found. Disposition   Pt is being discharged home today in good condition.  Follow-up Plans & Appointments    Follow-up Information    Lorretta Harp, MD Follow up.   Specialties:  Cardiology, Radiology Why:  Follow up after renal artery procedure on October 22nd at 10:45. Please arrive 15 minutes early for check in.  Contact information: [address] [place] [address] [phone]          Discharge Instructions    Diet - low sodium heart healthy   Complete by:  As directed    Discharge instructions   Complete by:  As directed    No driving for 5 days. No lifting over 5 lbs for 1 week. No sexual activity for 1 week. Keep procedure site clean & dry. If you notice increased pain, swelling, bleeding or pus, call/return!  You may shower, but no soaking baths/hot tubs/pools for 1 week.   Increase activity slowly   Complete by:  As directed       Discharge Medications   Allergies as of  07/22/2018      Reactions   Iron Hives   Vicodin [hydrocodone-acetaminophen] Other (See Comments)   Caused arrythmia    Amiodarone Nausea And Vomiting   Norvasc [amlodipine Besylate] Nausea And Vomiting   Promethazine Other (See Comments)   hallucinations   Vioxx [rofecoxib] Nausea And Vomiting      Medication List    TAKE these medications   alendronate 70 MG tablet Commonly known as:  FOSAMAX Take 70 mg by mouth every Friday.   aspirin 81 MG tablet Take 81 mg by mouth daily.   bimatoprost 0.01 % Soln Commonly known as:  LUMIGAN Place 1 drop into both eyes at bedtime.   carvedilol 25 MG tablet Commonly known as:  COREG TAKE ONE-HALF (1/2) TABLET TWICE A DAY WITH MEALS (NEEDS OFFICE VISIT) What changed:  See the new instructions.   clopidogrel 75 MG tablet Commonly known as:  PLAVIX Take 1 tablet (75 mg total) by mouth daily with breakfast. Start taking on:  07/23/2018   ezetimibe 10 MG tablet Commonly known as:  ZETIA Take 10 mg by  mouth at bedtime.   furosemide 20 MG tablet Commonly known as:  LASIX Take 20 mg by mouth daily as needed (for fluid retention.).   hydrALAZINE 25 MG tablet Commonly known as:  APRESOLINE Take 1 tablet (25 mg total) by mouth 3 (three) times daily.   hydrochlorothiazide 25 MG tablet Commonly known as:  HYDRODIURIL Take 25 mg by mouth daily.   meloxicam 7.5 MG tablet Commonly known as:  MOBIC Take 7.5 mg by mouth daily.   metFORMIN 1000 MG tablet Commonly known as:  GLUCOPHAGE TAKE 1 TABLET TWICE A DAY WITH MEALS   nitroGLYCERIN 0.4 MG SL tablet Commonly known as:  NITROSTAT Place 1 tablet (0.4 mg total) under the tongue every 5 (five) minutes as needed for chest pain.   rosuvastatin 40 MG tablet Commonly known as:  CRESTOR Take one tablet by mouth once daily for cholesterol What changed:    how much to take  how to take this  when to take this  additional instructions   traMADol 50 MG tablet Commonly known as:  ULTRAM Take 1 tablet (50 mg total) by mouth every 8 (eight) hours as needed. What changed:  reasons to take this        Acute coronary syndrome (MI, NSTEMI, STEMI, etc) this admission?: No.    Outstanding Labs/Studies     Duration of Discharge Encounter   Greater than 30 minutes including physician time.  Signed, Daune Perch, NP 07/22/2018, 10:48 AM  I have seen and examined the patient along with Daune Perch, NP.  I have reviewed the chart, notes and new data.  I agree with NP's note.  Key new complaints: Feels well, no complaints of pain at groin access site Key examination changes: No evidence of groin bleeding/hematoma or excessive pulsatility Key new findings / data: Creatinine stable after the procedure  PLAN: Blood pressure already seems to be improved.  Hopefully control will be more constant, less volatile. Reminded her that blood pressure should always be checked only in the right upper extremity since she has left subclavian  artery stenosis. She has instructions to adjust the dose of hydralazine based on the blood pressure.  Hopefully we will be able to discontinue that medication altogether.  [name], MD, [place] [phone] 07/22/2018, 11:45 AM

## 2018-07-25 ENCOUNTER — Other Ambulatory Visit: Payer: Self-pay | Admitting: Cardiovascular Disease

## 2018-07-25 DIAGNOSIS — Z9889 Other specified postprocedural states: Secondary | ICD-10-CM

## 2018-07-31 ENCOUNTER — Ambulatory Visit (INDEPENDENT_AMBULATORY_CARE_PROVIDER_SITE_OTHER): Payer: Medicare Other | Admitting: Vascular Surgery

## 2018-07-31 ENCOUNTER — Ambulatory Visit (HOSPITAL_COMMUNITY)
Admission: RE | Admit: 2018-07-31 | Discharge: 2018-07-31 | Disposition: A | Discharge status: Home / Self Care | Payer: Medicare Other | Source: Ambulatory Visit | Attending: Vascular Surgery | Admitting: Vascular Surgery

## 2018-07-31 ENCOUNTER — Other Ambulatory Visit: Payer: Self-pay

## 2018-07-31 ENCOUNTER — Ambulatory Visit (INDEPENDENT_AMBULATORY_CARE_PROVIDER_SITE_OTHER)
Admission: RE | Admit: 2018-07-31 | Discharge: 2018-07-31 | Disposition: A | Discharge status: Home / Self Care | Payer: Medicare Other | Source: Ambulatory Visit | Attending: Vascular Surgery | Admitting: Vascular Surgery

## 2018-07-31 ENCOUNTER — Encounter: Payer: Self-pay | Admitting: Vascular Surgery

## 2018-07-31 VITALS — BP 160/90 | HR 67 | Temp 97.4°F | Resp 16 | Ht 62.0 in | Wt 150.0 lb

## 2018-07-31 DIAGNOSIS — I739 Peripheral vascular disease, unspecified: Secondary | ICD-10-CM | POA: Insufficient documentation

## 2018-07-31 DIAGNOSIS — E119 Type 2 diabetes mellitus without complications: Secondary | ICD-10-CM | POA: Insufficient documentation

## 2018-07-31 DIAGNOSIS — I701 Atherosclerosis of renal artery: Secondary | ICD-10-CM

## 2018-07-31 DIAGNOSIS — I6523 Occlusion and stenosis of bilateral carotid arteries: Secondary | ICD-10-CM

## 2018-07-31 DIAGNOSIS — I10 Essential (primary) hypertension: Secondary | ICD-10-CM | POA: Insufficient documentation

## 2018-07-31 DIAGNOSIS — I716 Thoracoabdominal aortic aneurysm, without rupture, unspecified: Secondary | ICD-10-CM

## 2018-07-31 DIAGNOSIS — I6521 Occlusion and stenosis of right carotid artery: Secondary | ICD-10-CM | POA: Insufficient documentation

## 2018-07-31 DIAGNOSIS — Z87891 Personal history of nicotine dependence: Secondary | ICD-10-CM | POA: Insufficient documentation

## 2018-07-31 NOTE — Progress Notes (Signed)
Patient name: Kendra Todd MRN: [id number] DOB: 1941-06-01 Sex: female  HPI: Kendra Todd is a 77 y.o. female with a known type II thoracoabdominal aortic aneurysm that we have been following for several years.  She also has previous history of bilateral carotid endarterectomies in 2001.  She denies claudication symptoms.  She denies rest pain.  She denies any symptoms of TIA amaurosis or stroke.  She did recently have bilateral renal artery stenting by Dr. Alvester Chou.  She states she has not really seen much change in her blood pressure so far.  She was also told by Dr. Orene Desanctis recently that she had a left subclavian artery stenosis.  She denies any exertional fatigue in the left arm or dizziness associated with neck positioning to suggest this is symptomatic.  Other medical problems include coronary artery disease, hyperlipidemia both of which are currently stable.  She still has some control issues with her hypertension.  She is currently on 2 blood pressure medications for this.  Past Medical History:  Diagnosis Date  . Abdominal pain, epigastric   . Acute upper respiratory infections of unspecified site   . Arthritis   . Benign paroxysmal positional vertigo   . CAD (coronary artery disease)    prior coronary stenting  . Candidiasis of vulva and vagina   . Carotid artery disease ([place]) 2001   s/p Bilateral CEA, after CVA  . Cervicalgia   . Decreased pedal pulses 06/04/2011   doppler - bilateral ABIs no evidence of insufficiency; L CIA slightly elevated velocities 20-30% diameter reduction;   . Dyspnea 03/13/2006   Myoview - EF 76%; mild ischemia in apical lateral region, less severe than previous study in 2002  . Edema   . H/O carotid endarterectomy 03/06/2012   CT angio -high grade stenosis of proximal R internal carotid w/ lg posterior ulceration or dissection flap; near occlusive stenosis at origin of nondominantproximal L vertebral artery; <50% stenosis of proximal R vertebral and proximal L  subclavian artery  . H/O endarterectomy 09/11/2012   patent L and R carotid sites, R 60-79% restenosed ICA; L <40% restenosed ICA  . H/O endarterectomy 01/08/2012   doppler - R distal common carotid/proximal ICA stenosed 80-99%, L 0-39%  . HTN (hypertension) 11/27/2012  . Hyperlipidemia   . Limb pain 05/02/2011   doppler - no evidence of thrombus of thrombophlebitis  . Long term (current) use of anticoagulants   . Loss of weight   . Lumbago   . Myalgia and myositis, unspecified   . Myocardial infarction ([place])   . Nonspecific abnormal results of liver function study   . Nontoxic uninodular goiter   . Osteoarthrosis, unspecified whether generalized or localized, unspecified site   . Other malaise and fatigue   . Other manifestations of vitamin A deficiency   . PAF (paroxysmal atrial fibrillation) ([place]) 02/21/2011  . Pain in joint, pelvic region and thigh   . Pain in limb   . Peripheral vascular disease ([place])   . Phlebitis and thrombophlebitis of superficial vessels of lower extremities   . Primary localized osteoarthrosis, hand   . Pulmonary HTN ([place]) 12/03/2012  . Renal artery stenosis ([place])   . Routine gynecological examination   . Spinal stenosis, unspecified region other than cervical   . Stenosis of artery ([place]) 06/03/2012   doppler- most distal aspect of abd aorta no aneurysmal dilation; bilateral ABIs - mild L arterial insufficiency, L CIA narrowing w/ 50-69% diameter reduction; L and R SFA both w/  0-49% diameter reductions  . Stroke ([place]) 2001  . Superficial vein thrombosis 05/11/2015   Left forehead  . Tobacco use disorder   . TR (tricuspid regurgitation), severe 12/02/12 for repair with CABG 12/03/2012  . Type II or unspecified type diabetes mellitus with peripheral circulatory disorders, not stated as uncontrolled(250.70)   . Unspecified cataract   . Unspecified disorder of iris and ciliary body   . Unspecified vitamin D deficiency   . Urinary tract infection, site not specified     Past Surgical History:  Procedure Laterality Date  . ABDOMINAL HYSTERECTOMY  1975  . ANGIOPLASTY  1984 and 1982  . APPENDECTOMY  1954  . ARCH AORTOGRAM  03/30/2011   40% recurrent stenosis at origin of R proximal carotid patch, shelf like recurrent stenosis w/in midsection of patch that does not appear to be flow limiting; normal non-stenosed great vessel origins  . BACK SURGERY  '84,'86, '87   three previous surgeries  . CAROTID ENDARTERECTOMY  2001   Bilateral  . CORONARY ARTERY BYPASS GRAFT  12/04/2012   Procedure: CORONARY ARTERY BYPASS GRAFTING (CABG);  Surgeon: Ivin Poot, MD; LIMA-LAD, SVG-OM, SVG-RCA  . INTRAOPERATIVE TRANSESOPHAGEAL ECHOCARDIOGRAM  12/04/2012   Procedure: INTRAOPERATIVE TRANSESOPHAGEAL ECHOCARDIOGRAM;  Surgeon: Ivin Poot, MD;  Location: [facility];  Service: Open Heart Surgery;  Laterality: N/A;  . KIDNEY STONE SURGERY     Removal  . LEFT HEART CATHETERIZATION WITH CORONARY ANGIOGRAM N/A 11/28/2012   Procedure: LEFT HEART CATHETERIZATION WITH CORONARY ANGIOGRAM;  Surgeon: Leonie Man, MD;  Location: [facility] CATH LAB;  Service: Cardiovascular;  Laterality: N/A;  . PERCUTANEOUS CORONARY STENT INTERVENTION (PCI-S)  2002   2 BMS in ostial/Prox & mid RCA (mid 2.5 mm 20x  mm, & 2.75 mm x  8 mm ostial)  . PERIPHERAL VASCULAR INTERVENTION  07/21/2018   Procedure: PERIPHERAL VASCULAR INTERVENTION;  Surgeon: Lorretta Harp, MD;  Location: [facility] CV LAB;  Service: Cardiovascular;;  bilateral renal stents  . RENAL ANGIOGRAM N/A 11/30/2013   Procedure: RENAL ANGIOGRAM;  Surgeon: Lorretta Harp, MD;  Location: [facility] CATH LAB;  Service: Cardiovascular;  Laterality: N/A;  . RENAL ANGIOGRAPHY N/A 07/21/2018   Procedure: RENAL ANGIOGRAPHY;  Surgeon: Lorretta Harp, MD;  Location: [facility] CV LAB;  Service: Cardiovascular;  Laterality: N/A;  . RIGHT HEART CATHETERIZATION N/A 12/02/2012   Procedure: RIGHT HEART CATH;  Surgeon: Troy Sine, MD;  Location: [facility] CATH  LAB;  Service: Cardiovascular;  Laterality: N/A;  . TEE WITH CARDIOVERSION  01/23/2011   EF 63-14%; grade 2 diastolic dysfunction, elevated mean LA filling pressure, RV systolic pressure increased consistent w/ mod pulmonary hypertension    Family History  Problem Relation Age of Onset  . Other Mother        CVA  . Diabetes Mother   . Heart disease Mother        Heart disease before age 62  . Heart attack Mother   . Heart disease Father        Heart Disease before age 11  . Heart attack Father   . Heart disease Sister        Amputation  . Diabetes Sister   . Heart disease Brother        Heart disease before age 74  . Diabetes Brother   . Stroke Brother   . Stroke Brother   . Heart disease Brother   . Stroke Brother   . Heart disease Brother   . Heart  disease Sister   . Heart disease Sister   . Diabetes Daughter   . Cancer Sister   . Diabetes Other   . Cancer Other        breast  . Heart disease Other        cad  . Other Other        cva    SOCIAL HISTORY: Social History   Socioeconomic History  . Marital status: Widowed    Spouse name: Not on file  . Number of children: Not on file  . Years of education: Not on file  . Highest education level: Not on file  Occupational History  . Not on file  Social Needs  . Financial resource strain: Not hard at all  . Food insecurity:    Worry: Never true    Inability: Never true  . Transportation needs:    Medical: No    Non-medical: No  Tobacco Use  . Smoking status: Former Smoker    Packs/day: 0.30    Years: 57.00    Pack years: 17.10    Types: Cigarettes    Last attempt to quit: 11/19/2012    Years since quitting: 5.6  . Smokeless tobacco: Never Used  Substance and Sexual Activity  . Alcohol use: Yes    Comment: rarely, 1 a month  . Drug use: No  . Sexual activity: Not Currently  Lifestyle  . Physical activity:    Days per week: 0 days    Minutes per session: 0 min  . Stress: Only a little    Relationships  . Social connections:    Talks on phone: More than three times a week    Gets together: More than three times a week    Attends religious service: More than 4 times per year    Active member of club or organization: Yes    Attends meetings of clubs or organizations: More than 4 times per year    Relationship status: Widowed  . Intimate partner violence:    Fear of current or ex partner: No    Emotionally abused: No    Physically abused: No    Forced sexual activity: No  Other Topics Concern  . Not on file  Social History Narrative  . Not on file    Allergies  Allergen Reactions  . Iron Hives  . Vicodin [Hydrocodone-Acetaminophen] Other (See Comments)    Caused arrythmia   . Amiodarone Nausea And Vomiting  . Norvasc [Amlodipine Besylate] Nausea And Vomiting  . Promethazine Other (See Comments)    hallucinations  . Vioxx [Rofecoxib] Nausea And Vomiting    Current Outpatient Medications  Medication Sig Dispense Refill  . alendronate (FOSAMAX) 70 MG tablet Take 70 mg by mouth every Friday.     [name] aspirin 81 MG tablet Take 81 mg by mouth daily.     . bimatoprost (LUMIGAN) 0.01 % SOLN Place 1 drop into both eyes at bedtime.    . carvedilol (COREG) 25 MG tablet TAKE ONE-HALF (1/2) TABLET TWICE A DAY WITH MEALS (NEEDS OFFICE VISIT) (Patient taking differently: Take 12.5 mg by mouth 2 (two) times daily. ) 90 tablet 3  . clopidogrel (PLAVIX) 75 MG tablet Take 1 tablet (75 mg total) by mouth daily with breakfast. 90 tablet 3  . ezetimibe (ZETIA) 10 MG tablet Take 10 mg by mouth at bedtime.     . furosemide (LASIX) 20 MG tablet Take 20 mg by mouth daily as needed (for fluid retention.).     [name]  hydrALAZINE (APRESOLINE) 25 MG tablet Take 1 tablet (25 mg total) by mouth 3 (three) times daily. 90 tablet 3  . hydrochlorothiazide (HYDRODIURIL) 25 MG tablet Take 25 mg by mouth daily.    . meloxicam (MOBIC) 7.5 MG tablet Take 7.5 mg by mouth daily.    . metFORMIN (GLUCOPHAGE)  1000 MG tablet TAKE 1 TABLET TWICE A DAY WITH MEALS (Patient taking differently: Take 1,000 mg by mouth 2 (two) times daily with a meal. ) 180 tablet 1  . nitroGLYCERIN (NITROSTAT) 0.4 MG SL tablet Place 1 tablet (0.4 mg total) under the tongue every 5 (five) minutes as needed for chest pain. 25 tablet 3  . rosuvastatin (CRESTOR) 40 MG tablet Take one tablet by mouth once daily for cholesterol (Patient taking differently: Take 40 mg by mouth at bedtime. ) 90 tablet 3  . traMADol (ULTRAM) 50 MG tablet Take 1 tablet (50 mg total) by mouth every 8 (eight) hours as needed. (Patient taking differently: Take 50 mg by mouth every 8 (eight) hours as needed (for pain.). ) 30 tablet 0   No current facility-administered medications for this visit.     ROS:   General:  No weight loss, Fever, chills  HEENT: No recent headaches, no nasal bleeding, no visual changes, no sore throat  Neurologic: No dizziness, blackouts, seizures. No recent symptoms of stroke or mini- stroke. No recent episodes of slurred speech, or temporary blindness.  Cardiac: No recent episodes of chest pain/pressure, no shortness of breath at rest.  + shortness of breath with exertion.  Denies history of atrial fibrillation or irregular heartbeat  Vascular: No history of rest pain in feet.  No history of claudication.  No history of non-healing ulcer, No history of DVT   Pulmonary: No home oxygen, no productive cough, no hemoptysis,  No asthma or wheezing  Musculoskeletal:  [ ]  Arthritis, [ ]  Low back pain,  [ ]  Joint pain  Hematologic:No history of hypercoagulable state.  No history of easy bleeding.  No history of anemia  Gastrointestinal: No hematochezia or melena,  No gastroesophageal reflux, no trouble swallowing  Urinary: [ ]  chronic Kidney disease, [ ]  on HD - [ ]  MWF or [ ]  TTHS, [ ]  Burning with urination, [ ]  Frequent urination, [ ]  Difficulty urinating;   Skin: No rashes  Psychological: No history of anxiety,  No  history of depression   Physical Examination  Vitals:   07/31/18 1106 07/31/18 1115  BP: (!) 186/79 (!) 160/90  Pulse: 67   Resp: 16   Temp: (!) 97.4 F (36.3 C)   TempSrc: Oral   SpO2: 94%   Weight: 150 lb (68 kg)   Height: 5\' 2"  (1.575 m)     Body mass index is 27.44 kg/m.  General:  Alert and oriented, no acute distress HEENT: Normal Neck: No bruit or JVD Pulmonary: Clear to auscultation bilaterally Cardiac: Regular Rate and Rhythm without murmur Abdomen: Soft, non-tender, non-distended, no mass Skin: No rash Extremity Pulses:  2+ radial, brachial, femoral, dorsalis pedis pulses bilaterally Musculoskeletal: No deformity or edema  Neurologic: Upper and lower extremity motor 5/5 and symmetric  DATA:  Patient had bilateral ABIs and a carotid duplex scan performed today.  I reviewed and interpreted with the studies.  Her ABIs were greater than 1 and triphasic and normal bilaterally.  Her carotid duplex scan showed a 60 to 80% right internal carotid artery stenosis and less than 40% left internal carotid artery stenosis and some turbulent flow  in the left subclavian artery suggesting some level of stenosis on that side left vertebral artery had resistant flow right vertebral artery was antegrade flow.  I also reviewed the patient's images of her recent CT scan of the chest abdomen and pelvis dated May 28, 2018.  This shows the thoracic segment is 4.4 cm in diameter the perivisceral segment is 4 cm in diameter and the infrarenal segment is 3.4 cm in diameter.  This also showed bilateral renal artery stenosis which was prior to her recent renal artery stenting.  Also reviewed the patient's recent renal angiogram and stenting procedure performed by Dr. Alvester Chou.  ASSESSMENT: #1 carotid occlusive disease moderate recurrent stenosis right side 60 to 80% asymptomatic continue aspirin  2.  Lower extremity arterial occlusive disease normal audible pedal pulses normal ABIs does not need  further evaluation  3.  Type II thoracoabdominal aortic aneurysm stable in size 4.4 cm in the thoracic portion on CT scan from May 28, 2018.   PLAN: 1 repeat carotid duplex scan 6 months time.  I do not believe she needs any intervention on her left subclavian as long as she does not have exertional fatigue or cerebral symptoms related to this.  She currently does not.  We would consider intervention on her carotid disease if she become symptomatic or greater than 80%.  She should always measure the blood pressure in her right arm as her left arm will be artificially deflated.  2.  Lower extremity pain not secondary to arterial occlusive disease needs no further evaluation at this point unless she develops new symptoms  3.  Type II thoracoabdominal aneurysm needs repeat CT scan of this probably in 2 years consider repair if greater than 6 cm in diameter it has been stable in size for several years  Ruta Hinds, MD [facility]: [phone] Pager: [phone]

## 2018-08-04 ENCOUNTER — Telehealth: Payer: Self-pay | Admitting: Cardiovascular Disease

## 2018-08-04 NOTE — Telephone Encounter (Signed)
New message:       Pt states she is having some stomach pain since she had some stents put in and would like to discuss this with the RN

## 2018-08-04 NOTE — Telephone Encounter (Signed)
Spoke with pt. Pt sts that she has been having abdominal cramping since she had bilateral renal stents placed on 07/21/18 by Dr.Berry. Pt sts that the pain is relieved by Tramadol. Pt denies any signs of bleeding, no blood in urine or stool. Pt denies abdominal tenderness, she sts that her abdomen is soft and denies abdominal distention.  She is scheduled to have a GI consult tomorrow that was initiated by her PCP due to low hemoglobin. She was scheduled to see Desiree Hane in our office tomorrow @ 3:30pm. She asked that I reschedule that appt since she was previously schedule to see GI. appt rescheduled for 10/2 @ 1:30pm with Isaac Laud Meng,PA. sdv pt to keep the appt with GI for further evaluation of her stomach pain

## 2018-08-05 ENCOUNTER — Ambulatory Visit: Payer: Medicare Other | Admitting: Physician Assistant

## 2018-08-05 ENCOUNTER — Telehealth: Payer: Self-pay

## 2018-08-05 ENCOUNTER — Other Ambulatory Visit: Payer: Self-pay

## 2018-08-05 ENCOUNTER — Encounter: Payer: Self-pay | Admitting: Gastroenterology

## 2018-08-05 ENCOUNTER — Other Ambulatory Visit (INDEPENDENT_AMBULATORY_CARE_PROVIDER_SITE_OTHER): Payer: Medicare Other

## 2018-08-05 ENCOUNTER — Ambulatory Visit (INDEPENDENT_AMBULATORY_CARE_PROVIDER_SITE_OTHER): Payer: Medicare Other | Admitting: Gastroenterology

## 2018-08-05 VITALS — BP 120/60 | HR 60 | Ht 62.0 in | Wt 148.0 lb

## 2018-08-05 DIAGNOSIS — R1032 Left lower quadrant pain: Secondary | ICD-10-CM | POA: Diagnosis not present

## 2018-08-05 DIAGNOSIS — Z7902 Long term (current) use of antithrombotics/antiplatelets: Secondary | ICD-10-CM | POA: Diagnosis not present

## 2018-08-05 DIAGNOSIS — D509 Iron deficiency anemia, unspecified: Secondary | ICD-10-CM

## 2018-08-05 DIAGNOSIS — R1319 Other dysphagia: Secondary | ICD-10-CM | POA: Diagnosis not present

## 2018-08-05 LAB — CBC
HEMATOCRIT: 30 % — AB (ref 36.0–46.0)
Hemoglobin: 9.7 g/dL — ABNORMAL LOW (ref 12.0–15.0)
MCHC: 32.2 g/dL (ref 30.0–36.0)
MCV: 75.2 fl — AB (ref 78.0–100.0)
PLATELETS: 273 10*3/uL (ref 150.0–400.0)
RBC: 3.99 Mil/uL (ref 3.87–5.11)
RDW: 17.1 % — ABNORMAL HIGH (ref 11.5–15.5)
WBC: 6.3 10*3/uL (ref 4.0–10.5)

## 2018-08-05 LAB — B12 AND FOLATE PANEL
Folate: 14.1 ng/mL (ref >5.9)
Vitamin B-12: 360 pg/mL (ref 211–911)

## 2018-08-05 MED ORDER — DICYCLOMINE HCL 10 MG PO CAPS: ORAL_CAPSULE | ORAL | 1 refills | Status: DC

## 2018-08-05 NOTE — Telephone Encounter (Signed)
Dr [name] the pt has an allergy to Iron.  Do you want her to proceed with feraheme?

## 2018-08-05 NOTE — Progress Notes (Signed)
Faith VISIT   Primary Care Provider [name], DO [place] [address] [phone]  Referring Provider Madelon Lips, MD [address] [place], [place] [address] [phone]  Patient Profile: Kendra Todd is a 77 y.o. female with a pmh significant for CAD, Carotid Artery Disease, HTN, HLD, RAS (s/p recent stenting), PVD, pAFib, AAA with mural thrombus, DM.  The patient presents to the [facility] for an evaluation and management of problem(s) noted below:  Problem List 1. LLQ abdominal pain   2. Iron deficiency anemia, unspecified iron deficiency anemia type   3. Long term (current) use of antithrombotics/antiplatelets   4. Other dysphagia     History of Present Illness: This is the patient's first visit to the GI  clinic.  The patient is referred because of significant severe acute onset abdominal discomfort as well as recent history of iron deficiency anemia of unclear etiology.  In regards to the patient's current symptoms she states that since her recent renal stent placement she has developed a lower abdominal pressure and discomfort.  Pain to her feels as if these are menstrual-like cramps that occur mostly on left lower quadrant and radiate to her left flank region.  She denies any overt hematuria.  Pain is constant in quality but has peaks and valleys in regards to the intensity of the discomfort.  Can be very sharp and stabbing at times.  He can take tramadol and it does help with alleviating the discomfort for up to 5 to 6 hours but then returns.  She describes having constipation for years and has a bowel movement every other day she is brown in color and soft overall.  She has noted no blood loss including melena or hematochezia.  Her weight has been stable.  She has had some decrease in appetite though she thinks that is longer standing.  She recalls some mild dysphasia to solid foods occurring every few  months but no significant acid reflux or pyrosis.  She has issues of shortness of breath and dyspnea on exertion which has been stable for her.  She previously describes use of iron over-the-counter which led to hives and rash for which she ended up needing to go to the ED to get a Benadryl cocktail and not clear if she received epinephrine for it was told by her primary care doctor never to take an over-the-counter medication unless it were prescribed by her providers (she has an iron allergy at this point in time).  The patient takes an aspirin but does not take significant nonsteroidals or BC/Goody powders.  She has never had an upper or lower endoscopy.  GI Review of Systems Positive as above early satiety (longer standing) Negative for odynophagia, jaundice, melena, hematochezia, changes in bowel habits  Review of Systems General: Positive for fatigue; denies fevers/chills HEENT: Denies oral lesions Cardiovascular: Positive for bilateral swelling of her feet/legs; denies chest pain/palpitations Pulmonary: As per above stable Gastroenterological: See HPI Genitourinary: Denies darkened urine or hematuria Hematological: Denies easy bruising Psychological: Mood is concerned and anxious  Musculoskeletal: Positive for longer standing arthralgias as well as back pain   Medications Current Outpatient Medications  Medication Sig Dispense Refill  . alendronate (FOSAMAX) 70 MG tablet Take 70 mg by mouth every Friday.     [name] aspirin 81 MG tablet Take 81 mg by mouth daily.     . bimatoprost (LUMIGAN) 0.01 % SOLN Place 1 drop into both eyes at bedtime.    [name]  carvedilol (COREG) 25 MG tablet TAKE ONE-HALF (1/2) TABLET TWICE A DAY WITH MEALS (NEEDS OFFICE VISIT) (Patient taking differently: Take 12.5 mg by mouth 2 (two) times daily. ) 90 tablet 3  . clopidogrel (PLAVIX) 75 MG tablet Take 1 tablet (75 mg total) by mouth daily with breakfast. 90 tablet 3  . dicyclomine (BENTYL) 10 MG capsule Take 3 times  daily as needed for spasm. 90 capsule 1  . ezetimibe (ZETIA) 10 MG tablet Take 10 mg by mouth at bedtime.     . furosemide (LASIX) 20 MG tablet Take 20 mg by mouth daily as needed (for fluid retention.).     [name] hydrALAZINE (APRESOLINE) 25 MG tablet Take 1 tablet (25 mg total) by mouth 3 (three) times daily. 90 tablet 3  . hydrochlorothiazide (HYDRODIURIL) 25 MG tablet Take 25 mg by mouth daily.    . meloxicam (MOBIC) 7.5 MG tablet Take 7.5 mg by mouth daily.    . metFORMIN (GLUCOPHAGE) 1000 MG tablet TAKE 1 TABLET TWICE A DAY WITH MEALS (Patient taking differently: Take 1,000 mg by mouth 2 (two) times daily with a meal. ) 180 tablet 1  . nitroGLYCERIN (NITROSTAT) 0.4 MG SL tablet Place 1 tablet (0.4 mg total) under the tongue every 5 (five) minutes as needed for chest pain. 25 tablet 3  . rosuvastatin (CRESTOR) 40 MG tablet Take one tablet by mouth once daily for cholesterol (Patient taking differently: Take 40 mg by mouth at bedtime. ) 90 tablet 3  . traMADol (ULTRAM) 50 MG tablet Take 1 tablet (50 mg total) by mouth every 8 (eight) hours as needed. (Patient taking differently: Take 50 mg by mouth every 8 (eight) hours as needed (for pain.). ) 30 tablet 0   No current facility-administered medications for this visit.     Allergies Allergies  Allergen Reactions  . Iron Hives  . Vicodin [Hydrocodone-Acetaminophen] Other (See Comments)    Caused arrythmia   . Amiodarone Nausea And Vomiting  . Norvasc [Amlodipine Besylate] Nausea And Vomiting  . Promethazine Other (See Comments)    hallucinations  . Vioxx [Rofecoxib] Nausea And Vomiting    Histories Past Medical History:  Diagnosis Date  . Abdominal pain, epigastric   . Acute upper respiratory infections of unspecified site   . Arthritis   . Benign paroxysmal positional vertigo   . CAD (coronary artery disease)    prior coronary stenting  . Candidiasis of vulva and vagina   . Carotid artery disease ([place]) 2001   s/p Bilateral CEA,  after CVA  . Cervicalgia   . Decreased pedal pulses 06/04/2011   doppler - bilateral ABIs no evidence of insufficiency; L CIA slightly elevated velocities 20-30% diameter reduction;   . Dyspnea 03/13/2006   Myoview - EF 76%; mild ischemia in apical lateral region, less severe than previous study in 2002  . Edema   . H/O carotid endarterectomy 03/06/2012   CT angio -high grade stenosis of proximal R internal carotid w/ lg posterior ulceration or dissection flap; near occlusive stenosis at origin of nondominantproximal L vertebral artery; <50% stenosis of proximal R vertebral and proximal L subclavian artery  . H/O endarterectomy 09/11/2012   patent L and R carotid sites, R 60-79% restenosed ICA; L <40% restenosed ICA  . H/O endarterectomy 01/08/2012   doppler - R distal common carotid/proximal ICA stenosed 80-99%, L 0-39%  . HTN (hypertension) 11/27/2012  . Hyperlipidemia   . Limb pain 05/02/2011   doppler - no evidence of thrombus of thrombophlebitis  .  Long term (current) use of anticoagulants   . Loss of weight   . Lumbago   . Myalgia and myositis, unspecified   . Myocardial infarction ([place])   . Nonspecific abnormal results of liver function study   . Nontoxic uninodular goiter   . Osteoarthrosis, unspecified whether generalized or localized, unspecified site   . Other malaise and fatigue   . Other manifestations of vitamin A deficiency   . PAF (paroxysmal atrial fibrillation) ([place]) 02/21/2011  . Pain in joint, pelvic region and thigh   . Pain in limb   . Peripheral vascular disease ([place])   . Phlebitis and thrombophlebitis of superficial vessels of lower extremities   . Primary localized osteoarthrosis, hand   . Pulmonary HTN ([place]) 12/03/2012  . Renal artery stenosis ([place])   . Routine gynecological examination   . Spinal stenosis, unspecified region other than cervical   . Stenosis of artery ([place]) 06/03/2012   doppler- most distal aspect of abd aorta no aneurysmal dilation; bilateral ABIs  - mild L arterial insufficiency, L CIA narrowing w/ 50-69% diameter reduction; L and R SFA both w/ 0-49% diameter reductions  . Stroke ([place]) 2001  . Superficial vein thrombosis 05/11/2015   Left forehead  . Tobacco use disorder   . TR (tricuspid regurgitation), severe 12/02/12 for repair with CABG 12/03/2012  . Type II or unspecified type diabetes mellitus with peripheral circulatory disorders, not stated as uncontrolled(250.70)   . Unspecified cataract   . Unspecified disorder of iris and ciliary body   . Unspecified vitamin D deficiency   . Urinary tract infection, site not specified    Past Surgical History:  Procedure Laterality Date  . ABDOMINAL HYSTERECTOMY  1975  . ANGIOPLASTY  1984 and 1982  . APPENDECTOMY  1954  . ARCH AORTOGRAM  03/30/2011   40% recurrent stenosis at origin of R proximal carotid patch, shelf like recurrent stenosis w/in midsection of patch that does not appear to be flow limiting; normal non-stenosed great vessel origins  . BACK SURGERY  '84,'86, '87   three previous surgeries  . CAROTID ENDARTERECTOMY  2001   Bilateral  . CORONARY ARTERY BYPASS GRAFT  12/04/2012   Procedure: CORONARY ARTERY BYPASS GRAFTING (CABG);  Surgeon: Ivin Poot, MD; LIMA-LAD, SVG-OM, SVG-RCA  . INTRAOPERATIVE TRANSESOPHAGEAL ECHOCARDIOGRAM  12/04/2012   Procedure: INTRAOPERATIVE TRANSESOPHAGEAL ECHOCARDIOGRAM;  Surgeon: Ivin Poot, MD;  Location: [facility];  Service: Open Heart Surgery;  Laterality: N/A;  . KIDNEY STONE SURGERY     Removal  . LEFT HEART CATHETERIZATION WITH CORONARY ANGIOGRAM N/A 11/28/2012   Procedure: LEFT HEART CATHETERIZATION WITH CORONARY ANGIOGRAM;  Surgeon: Leonie Man, MD;  Location: [facility] CATH LAB;  Service: Cardiovascular;  Laterality: N/A;  . PERCUTANEOUS CORONARY STENT INTERVENTION (PCI-S)  2002   2 BMS in ostial/Prox & mid RCA (mid 2.5 mm 20x  mm, & 2.75 mm x  8 mm ostial)  . PERIPHERAL VASCULAR INTERVENTION  07/21/2018   Procedure: PERIPHERAL VASCULAR  INTERVENTION;  Surgeon: Lorretta Harp, MD;  Location: [facility] CV LAB;  Service: Cardiovascular;;  bilateral renal stents  . RENAL ANGIOGRAM N/A 11/30/2013   Procedure: RENAL ANGIOGRAM;  Surgeon: Lorretta Harp, MD;  Location: [facility] CATH LAB;  Service: Cardiovascular;  Laterality: N/A;  . RENAL ANGIOGRAPHY N/A 07/21/2018   Procedure: RENAL ANGIOGRAPHY;  Surgeon: Lorretta Harp, MD;  Location: [place] CV LAB;  Service: Cardiovascular;  Laterality: N/A;  . RIGHT HEART CATHETERIZATION N/A 12/02/2012   Procedure: RIGHT HEART  CATH;  Surgeon: Troy Sine, MD;  Location: [facility] CATH LAB;  Service: Cardiovascular;  Laterality: N/A;  . TEE WITH CARDIOVERSION  01/23/2011   EF 25-85%; grade 2 diastolic dysfunction, elevated mean LA filling pressure, RV systolic pressure increased consistent w/ mod pulmonary hypertension   Social History   Socioeconomic History  . Marital status: Widowed    Spouse name: Not on file  . Number of children: Not on file  . Years of education: Not on file  . Highest education level: Not on file  Occupational History  . Occupation: retired  Scientific laboratory technician  . Financial resource strain: Not hard at all  . Food insecurity:    Worry: Never true    Inability: Never true  . Transportation needs:    Medical: No    Non-medical: No  Tobacco Use  . Smoking status: Former Smoker    Packs/day: 0.30    Years: 57.00    Pack years: 17.10    Types: Cigarettes    Last attempt to quit: 11/19/2012    Years since quitting: 5.7  . Smokeless tobacco: Never Used  Substance and Sexual Activity  . Alcohol use: Yes    Comment: rarely, 1 a month  . Drug use: No  . Sexual activity: Not Currently  Lifestyle  . Physical activity:    Days per week: 0 days    Minutes per session: 0 min  . Stress: Only a little  Relationships  . Social connections:    Talks on phone: More than three times a week    Gets together: More than three times a week    Attends religious service: More  than 4 times per year    Active member of club or organization: Yes    Attends meetings of clubs or organizations: More than 4 times per year    Relationship status: Widowed  . Intimate partner violence:    Fear of current or ex partner: No    Emotionally abused: No    Physically abused: No    Forced sexual activity: No  Other Topics Concern  . Not on file  Social History Narrative  . Not on file   Family History  Problem Relation Age of Onset  . Other Mother        CVA  . Diabetes Mother   . Heart disease Mother        Heart disease before age 50  . Heart attack Mother   . Heart disease Father        Heart Disease before age 61  . Heart attack Father   . Heart disease Sister        Amputation  . Diabetes Sister   . Heart disease Brother        Heart disease before age 61  . Diabetes Brother   . Stroke Brother   . Stroke Brother   . Heart disease Brother   . Stroke Brother   . Heart disease Brother   . Heart disease Sister   . Heart disease Sister   . Diabetes Daughter   . Cancer Sister   . Diabetes Other   . Cancer Other        breast  . Heart disease Other        cad  . Other Other        cva  . Colon cancer Neg Hx   . Esophageal cancer Neg Hx   . Inflammatory bowel disease Neg Hx   .  Liver disease Neg Hx   . Pancreatic cancer Neg Hx   . Rectal cancer Neg Hx   . Stomach cancer Neg Hx    I have reviewed her medical, social, and family history in detail and updated the electronic medical record as necessary.    PHYSICAL EXAMINATION  BP 120/60   Pulse 60   Ht 5\' 2"  (1.575 m)   Wt 148 lb (67.1 kg)   BMI 27.07 kg/m  Wt Readings from Last 3 Encounters:  08/05/18 148 lb (67.1 kg)  07/31/18 150 lb (68 kg)  07/22/18 154 lb 5.2 oz (70 kg)  GEN: NAD, fatigued, appears uncomfortable, appears younger than stated age PSYCH: Cooperative, without pressured speech, appears sad EYE: Conjunctivae pink, sclerae anicteric ENT: MMM, without oral ulcers, no erythema  or exudates noted NECK: Supple CV: Irregularly irregular RESP: Decreased breath sounds at the bases bilaterally but no significant adventitious sounds GI: NABS, soft, tenderness to palpation in the left lower quadrant radiating to her left flank, ND, volitional guarding is appreciated in the left lower quadrant without rebound, no HSM appreciated MSK/EXT: Bilateral lower extremity edema is present SKIN: No jaundice NEURO:  Alert & Oriented x 3, no focal deficits   REVIEW OF DATA  I reviewed the following data at the time of this encounter:  GI Procedures and Studies  No relevant studies to review  Laboratory Studies  Reviewed in epic  Imaging Studies  7/19 CT-CAP Angio IMPRESSION: Severely atherosclerotic aorta with extensive irregular thrombus, similar to prior study. Mild aneurysmal dilatation of the descending thoracic aorta, maximally 4.4 cm compared to 4.3 cm previously. Maximum abdominal aortic diameter 4 cm suprarenal and 3.4 cm infrarenal. No significant change since prior study. Moderate to severe ostial stenosis involving the celiac, SMA and bilateral renal arteries, similar to prior study. Asymmetric left renal atrophy, likely related to chronic renovascular disease. Mild cardiomegaly. Trace bilateral pleural effusions. No acute findings in the abdomen or pelvis.  3/19 CT-CAP Angio IMPRESSION: 1. No acute aortic syndrome. Severely atherosclerotic aorta with extensively ulcerated mural thrombus. Aortic Atherosclerosis (ICD10-I70.0). 2. Diffuse aneurysmal dilatation of the descending and abdominal aorta, stable since 2017 CT. Maximum descending aortic diameter 4.3 cm. Maximum abdominal aortic diameter 4.2 cm suprarenal and 3.6 cm infrarenal. 3. Multifocal significant atherosclerotic stenoses within the abdominal aortic branch vessels as detailed. 4. Cardiomegaly. Contrast reflux into the IVC and hepatic veins, suggesting right heart failure. 5. No active  pulmonary disease. 6. Finely irregular liver surface, suggestive of cirrhosis. 7. Asymmetric left renal atrophy.  2013 Barium Esophagram IMPRESSION: 1.  Indentation on the lower aspect of the cervical esophagus from the right may be due to an enlarged right lobe of thyroid. Consider thyroid ultrasound to assess further. 2.  Mild tertiary contractions.  No reflux.  2012 U/S Abdomen IMPRESSION: 1.  No gallstones.  No ductal dilatation. 2.  The tail of the pancreas is obscured by bowel gas. 3.  Fusiformly prominent abdominal aorta with atherosclerotic change.  No focal aneurysm. 4.  8 mm left lower pole renal angiomyolipoma.   ASSESSMENT  Ms. Kreischer is a 77 y.o. female with a pmh significant for CAD, Carotid Artery Disease, HTN, HLD, RAS (s/p recent stenting), PVD, pAFib, AAA with mural thrombus, DM. The patient is seen today for evaluation and management of:  1. LLQ abdominal pain   2. Iron deficiency anemia, unspecified iron deficiency anemia type   3. Long term (current) use of antithrombotics/antiplatelets   4. Other dysphagia  The patient's clinical history and symptomatology not clearly delineating his particular diagnosis at this point in time.  She is had multiple CT scans which showed diverticulosis in the left lower quadrant she is not having overt fevers.  With that being said that the timing of the initiation of her discomfort after her renal artery stents have been placed in the setting of known significant ostial disease of her major aortic vessels one would query the possibility of a steal phenomena that may be removing her taking away blood flow to other regions of the bowel in setting of now having more significant renal artery perfusion.  I do agree that the type of discomfort she is describing is not typical mesenteric ischemia in regards to postprandial pain however we do have to consider this is a possibility.  I described the role of upper and lower endoscopies to  evaluate the patient and we will be awaiting her physicians input in the course of the coming days as to the exact timing of being able to Plavix before diagnostic evaluation (from most recent documentation earliest Plavix could be removed would be 3 months).  The patient is also very hesitant about upper or lower endoscopies because of the risks that are associated with them.  The risks and benefits of endoscopic evaluation were discussed with the patient; these include but are not limited to the risk of perforation, infection, bleeding, missed lesions, lack of diagnosis, severe illness requiring hospitalization, as well as anesthesia and sedation related illnesses.  The patient is not agreeable to proceed as of yet.  We will begin a work-up and based on her discomfort over the course of this week into next week we will consider a CT scan of the abdomen if her discomfort persists with contrast as long as her kidneys can tolerate this to ensure that there are no other significant issues.  If we are unable to perform endoscopic evaluation we could query the role of a CT enterography as well as CT colonoscopy could be an repeat upper GI series but again I think a diagnostic evaluation could be considered while on Plavix with the patient understanding the increased risk for bleeding if we actually had to do something sooner rather than later.  Cannot give her oral iron supplementation because of her issues previously and so we will begin administering her IV Feraheme with medications to prevent her from having a potential allergic reaction.  Further IV iron will be given as per protocol on how she manifests her iron studies in about 1 month.  We will rule out other etiologies for abdominal discomfort as much as we can without a structural evaluation.  She will be seeing her team in regards to recent renal artery stenting later this week to discuss further evaluation and etiology of her abdominal pain postprocedure.   All patient questions were answered, to the best of my ability, and the patient agrees to the aforementioned plan of action with follow-up as indicated.   PLAN  1. Iron deficiency anemia, unspecified iron deficiency anemia type - CBC; Future - Reticulocytes; Future - B12 and Folate Panel; Future - Tissue transglutaminase, IgA; Future - Try to add-on IgA level -Would consider a endoscopy/colonoscopy to begin work-up of her iron deficiency anemia patient defers on this currently  2. LLQ abdominal pain - Helicobacter pylori special antigen; Future - Endoscopic Evaluation to be considered by patient - If pain persists then would proceed with ordering CT-Abdomen with contrast to ensure no evidence  of diverticular disease (though none on multiple CTs for years) - Bentyl TID PRN for spasms  3. Long term (current) use of antithrombotics/antiplatelets -Remains on Plavix with plan for not being able to remove for at least 3 months  4. Other dysphagia -Previously had barium esophagram years ago showing no overt issues but this was many years ago - We will consider endoscopic evaluation as per iron deficiency evaluation   Orders Placed This Encounter  Procedures  . Helicobacter pylori special antigen  . CBC  . Reticulocytes  . B12 and Folate Panel  . Tissue transglutaminase, IgA    New Prescriptions   DICYCLOMINE (BENTYL) 10 MG CAPSULE    Take 3 times daily as needed for spasm.   Modified Medications   No medications on file    Planned Follow Up: No follow-ups on file.   Justice Britain, MD [facility] Office # [phone]

## 2018-08-05 NOTE — Telephone Encounter (Signed)
I spoke with patient about this and will document in my Note. She had an allergy to OTC Iron supplementation that led to hives and a rash requiring Benadryl.  I think, for safety purposes, I would ask that we give her a bit of Benadryl oral. I'm not sure steroid preparation is required. Thanks.  Chester Holstein

## 2018-08-05 NOTE — Telephone Encounter (Signed)
-----   Message from Angie Fava, LPN sent at 79/0/2409  3:19 PM EDT ----- Regarding: Feraheme infusion Patient needs to be set up for Feraheme 510/87ml in 1-2 weeks. She is going to follow up with Anderson Malta on 08/25/18. He would like this done before her follow up. Thanks [employer]

## 2018-08-05 NOTE — Patient Instructions (Addendum)
Your provider has requested that you go to the basement level for lab work before leaving today. Press "B" on the elevator. The lab is located at the first door on the left as you exit the elevator.  We have scheduled you for a follow up appointment with Ellouise Newer  on 08/25/18 at 9:45am  Your provider has requested that you go to the basement level for lab work before leaving today. Press "B" on the elevator. The lab is located at the first door on the left as you exit the elevator.   You will be contacted regarding your iron infusion.  We have sent the following medications to your pharmacy for you to pick up at your convenience:   Thank you for entrusting me with your care and choosing [facility] care.  Dr [name]

## 2018-08-06 ENCOUNTER — Other Ambulatory Visit: Payer: Self-pay

## 2018-08-06 ENCOUNTER — Other Ambulatory Visit: Payer: Medicare Other

## 2018-08-06 ENCOUNTER — Encounter: Payer: Self-pay | Admitting: Physician Assistant

## 2018-08-06 ENCOUNTER — Ambulatory Visit (INDEPENDENT_AMBULATORY_CARE_PROVIDER_SITE_OTHER): Payer: Medicare Other | Admitting: Physician Assistant

## 2018-08-06 ENCOUNTER — Encounter: Payer: Self-pay | Admitting: Gastroenterology

## 2018-08-06 VITALS — BP 125/59 | HR 65 | Ht 62.0 in | Wt 148.0 lb

## 2018-08-06 DIAGNOSIS — R1032 Left lower quadrant pain: Secondary | ICD-10-CM | POA: Insufficient documentation

## 2018-08-06 DIAGNOSIS — I150 Renovascular hypertension: Secondary | ICD-10-CM | POA: Diagnosis not present

## 2018-08-06 DIAGNOSIS — E785 Hyperlipidemia, unspecified: Secondary | ICD-10-CM | POA: Diagnosis not present

## 2018-08-06 DIAGNOSIS — I712 Thoracic aortic aneurysm, without rupture, unspecified: Secondary | ICD-10-CM

## 2018-08-06 DIAGNOSIS — I871 Compression of vein: Secondary | ICD-10-CM

## 2018-08-06 DIAGNOSIS — E119 Type 2 diabetes mellitus without complications: Secondary | ICD-10-CM

## 2018-08-06 DIAGNOSIS — I25810 Atherosclerosis of coronary artery bypass graft(s) without angina pectoris: Secondary | ICD-10-CM

## 2018-08-06 DIAGNOSIS — I739 Peripheral vascular disease, unspecified: Secondary | ICD-10-CM | POA: Diagnosis not present

## 2018-08-06 DIAGNOSIS — I10 Essential (primary) hypertension: Secondary | ICD-10-CM

## 2018-08-06 DIAGNOSIS — D509 Iron deficiency anemia, unspecified: Secondary | ICD-10-CM | POA: Diagnosis not present

## 2018-08-06 DIAGNOSIS — N183 Chronic kidney disease, stage 3 unspecified: Secondary | ICD-10-CM

## 2018-08-06 DIAGNOSIS — I701 Atherosclerosis of renal artery: Secondary | ICD-10-CM | POA: Diagnosis not present

## 2018-08-06 DIAGNOSIS — Z7902 Long term (current) use of antithrombotics/antiplatelets: Secondary | ICD-10-CM | POA: Insufficient documentation

## 2018-08-06 DIAGNOSIS — R1319 Other dysphagia: Secondary | ICD-10-CM | POA: Insufficient documentation

## 2018-08-06 DIAGNOSIS — I480 Paroxysmal atrial fibrillation: Secondary | ICD-10-CM

## 2018-08-06 LAB — RETICULOCYTES
ABS Retic: 50960 cells/uL (ref 20000–8000)
Retic Ct Pct: 1.3 %

## 2018-08-06 LAB — TISSUE TRANSGLUTAMINASE, IGA: (tTG) Ab, IgA: 1 U/mL

## 2018-08-06 MED ORDER — HYDROCHLOROTHIAZIDE 12.5 MG PO TABS: 12.5000 mg | ORAL_TABLET | Freq: Every day | ORAL | 3 refills | Status: DC

## 2018-08-06 MED ORDER — HYDRALAZINE HCL 25 MG PO TABS: 25.0000 mg | ORAL_TABLET | Freq: Two times a day (BID) | ORAL | 3 refills | Status: DC

## 2018-08-06 NOTE — Progress Notes (Signed)
Cardiology Office Note    Date:  08/08/2018   ID:  Kendra Todd, DOB [birth year], MRN [id number]  PCP:  [name], DO  Cardiologist: Dr. [name] / Dr. Gwenlyn Found   Chief Complaint  Patient presents with  . Hospitalization Follow-up    stents in kidney    History of Present Illness:  Kendra Todd is a 77 y.o. female with past medical history of CAD s/p CABG 2014, thoracoabdominal aortic aneurysm, CKD stage III, L subclavian stenosis, severe pulmonary artery hypertension, carotid artery disease s/p bilateral CEA, history of CVA, hypertension, hyperlipidemia, PAF and DM2.  She had a history of stents in the RCA in 2002 and several angioplasty prior to that.  Echo in January 2017 showed severe pulmonary hypertension, PA peak pressure 73, grade 2 DD and moderate MR.  She has had bilateral carotid artery endarterectomies with recurrent stenosis.  One of her surgery was complicated by a dissection flap and a stroke, she has recovered since.  Her thoracoabdominal aortic aneurysm is being followed by Dr. Oneida Alar.  She has also undergone previous TEE guided cardioversion for paroxysmal atrial fibrillation in 2012.  She was seen by Dr. [name] on 06/23/2018 at which time a renal artery ultrasound was obtained.  Renal artery ultrasound obtained on 07/04/2018 showed 70 to 99% stenosis in the superior mesenteric artery and the celiac artery, greater than 60% stenosis in the right renal artery, greater than 60% stenosis in the left renal artery as well.  Patient was referred to Dr. Gwenlyn Found for catheterization with stenting of the renal artery.  She underwent planned renal angiography on 07/22/2019 2019 by Dr. Sherrie George and underwent bilateral renal artery stenting. and underwent bilateral renal artery stenting.  Patient presents today for cardiology office visit.  She is undergoing GI evaluation for anemia and potentially will require colonoscopy.  I discussed the case with Dr. Gwenlyn Found, given the new renal artery stent, it  was recommended to continue Plavix uninterrupted for at least 54-month.  Therefore I would recommend to delay the colonoscopy if needed to 77-month from now.  She also complained of some upper back pain, this is unlikely related to the recent intervention.  Suspicion for retroperitoneal bleed very low since the renal artery catheterization wire did not reach that far up the back.  We will begin to scale back on her blood pressure medication.  I will reduce her hydrochlorothiazide to 12.5 mg daily and also decrease hydralazine to 25 mg twice daily instead of 3 times daily.  I will arrange a renal artery Doppler prior to her next visit with Dr. Gwenlyn Found near the end of this month.   Past Medical History:  Diagnosis Date  . Abdominal pain, epigastric   . Acute upper respiratory infections of unspecified site   . Arthritis   . Benign paroxysmal positional vertigo   . CAD (coronary artery disease)    prior coronary stenting  . Candidiasis of vulva and vagina   . Carotid artery disease ([place]) 2001   s/p Bilateral CEA, after CVA  . Cervicalgia   . Decreased pedal pulses 06/04/2011   doppler - bilateral ABIs no evidence of insufficiency; L CIA slightly elevated velocities 20-30% diameter reduction;   . Dyspnea 03/13/2006   Myoview - EF 76%; mild ischemia in apical lateral region, less severe than previous study in 2002  . Edema   . H/O carotid endarterectomy 03/06/2012   CT angio -high grade stenosis of proximal R internal carotid w/ lg posterior ulceration  or dissection flap; near occlusive stenosis at origin of nondominantproximal L vertebral artery; <50% stenosis of proximal R vertebral and proximal L subclavian artery  . H/O endarterectomy 09/11/2012   patent L and R carotid sites, R 60-79% restenosed ICA; L <40% restenosed ICA  . H/O endarterectomy 01/08/2012   doppler - R distal common carotid/proximal ICA stenosed 80-99%, L 0-39%  . HTN (hypertension) 11/27/2012  . Hyperlipidemia   . Limb pain 05/02/2011     doppler - no evidence of thrombus of thrombophlebitis  . Long term (current) use of anticoagulants   . Loss of weight   . Lumbago   . Myalgia and myositis, unspecified   . Myocardial infarction ([place])   . Nonspecific abnormal results of liver function study   . Nontoxic uninodular goiter   . Osteoarthrosis, unspecified whether generalized or localized, unspecified site   . Other malaise and fatigue   . Other manifestations of vitamin A deficiency   . PAF (paroxysmal atrial fibrillation) ([place]) 02/21/2011  . Pain in joint, pelvic region and thigh   . Pain in limb   . Peripheral vascular disease ([place])   . Phlebitis and thrombophlebitis of superficial vessels of lower extremities   . Primary localized osteoarthrosis, hand   . Pulmonary HTN ([place]) 12/03/2012  . Renal artery stenosis ([place])   . Routine gynecological examination   . Spinal stenosis, unspecified region other than cervical   . Stenosis of artery ([place]) 06/03/2012   doppler- most distal aspect of abd aorta no aneurysmal dilation; bilateral ABIs - mild L arterial insufficiency, L CIA narrowing w/ 50-69% diameter reduction; L and R SFA both w/ 0-49% diameter reductions  . Stroke ([place]) 2001  . Superficial vein thrombosis 05/11/2015   Left forehead  . Tobacco use disorder   . TR (tricuspid regurgitation), severe 12/02/12 for repair with CABG 12/03/2012  . Type II or unspecified type diabetes mellitus with peripheral circulatory disorders, not stated as uncontrolled(250.70)   . Unspecified cataract   . Unspecified disorder of iris and ciliary body   . Unspecified vitamin D deficiency   . Urinary tract infection, site not specified     Past Surgical History:  Procedure Laterality Date  . ABDOMINAL HYSTERECTOMY  1975  . ANGIOPLASTY  1984 and 1982  . APPENDECTOMY  1954  . ARCH AORTOGRAM  03/30/2011   40% recurrent stenosis at origin of R proximal carotid patch, shelf like recurrent stenosis w/in midsection of patch that does not  appear to be flow limiting; normal non-stenosed great vessel origins  . BACK SURGERY  '84,'86, '87   three previous surgeries  . CAROTID ENDARTERECTOMY  2001   Bilateral  . CORONARY ARTERY BYPASS GRAFT  12/04/2012   Procedure: CORONARY ARTERY BYPASS GRAFTING (CABG);  Surgeon: Ivin Poot, MD; LIMA-LAD, SVG-OM, SVG-RCA  . INTRAOPERATIVE TRANSESOPHAGEAL ECHOCARDIOGRAM  12/04/2012   Procedure: INTRAOPERATIVE TRANSESOPHAGEAL ECHOCARDIOGRAM;  Surgeon: Ivin Poot, MD;  Location: [facility];  Service: Open Heart Surgery;  Laterality: N/A;  . KIDNEY STONE SURGERY     Removal  . LEFT HEART CATHETERIZATION WITH CORONARY ANGIOGRAM N/A 11/28/2012   Procedure: LEFT HEART CATHETERIZATION WITH CORONARY ANGIOGRAM;  Surgeon: Leonie Man, MD;  Location[facility] C[facility] LAB;  Service: Cardiovascular;  Laterality: N/A;  . PERCUTANEOUS CORONARY STENT INTERVENTION (PCI-S)  2002   2 BMS in ostial/Prox & mid RCA (mid 2.5 mm 20x  mm, & 2.75 mm x  8 mm ostial)  . PERIPHERAL VASCULAR INTERVENTION  07/21/2018   Procedure:  PERIPHERAL VASCULAR INTERVENTION;  Surgeon: Lorretta Harp, MD;  Location: [facility] CV LAB;  Service: Cardiovascular;;  bilateral renal stents  . RENAL ANGIOGRAM N/A 11/30/2013   Procedure: RENAL ANGIOGRAM;  Surgeon: Lorretta Harp, MD;  Location: [facility] CATH LAB;  Service: Cardiovascular;  Laterality: N/A;  . RENAL ANGIOGRAPHY N/A 07/21/2018   Procedure: RENAL ANGIOGRAPHY;  Surgeon: Lorretta Harp, MD;  Location: [facility] CV LAB;  Service: Cardiovascular;  Laterality: N/A;  . RIGHT HEART CATHETERIZATION N/A 12/02/2012   Procedure: RIGHT HEART CATH;  Surgeon: Troy Sine, MD;  Location: [facility] CATH LAB;  Service: Cardiovascular;  Laterality: N/A;  . TEE WITH CARDIOVERSION  01/23/2011   EF 32-20%; grade 2 diastolic dysfunction, elevated mean LA filling pressure, RV systolic pressure increased consistent w/ mod pulmonary hypertension    Current Medications: Outpatient Medications Prior to Visit   Medication Sig Dispense Refill  . alendronate (FOSAMAX) 70 MG tablet Take 70 mg by mouth every Friday.     [name] aspirin 81 MG tablet Take 81 mg by mouth daily.     . bimatoprost (LUMIGAN) 0.01 % SOLN Place 1 drop into both eyes at bedtime.    . carvedilol (COREG) 25 MG tablet TAKE ONE-HALF (1/2) TABLET TWICE A DAY WITH MEALS (NEEDS OFFICE VISIT) (Patient taking differently: Take 12.5 mg by mouth 2 (two) times daily. ) 90 tablet 3  . clopidogrel (PLAVIX) 75 MG tablet Take 1 tablet (75 mg total) by mouth daily with breakfast. 90 tablet 3  . dicyclomine (BENTYL) 10 MG capsule Take 3 times daily as needed for spasm. 90 capsule 1  . ezetimibe (ZETIA) 10 MG tablet Take 10 mg by mouth at bedtime.     . furosemide (LASIX) 20 MG tablet Take 20 mg by mouth daily as needed (for fluid retention.).     [name] meloxicam (MOBIC) 7.5 MG tablet Take 7.5 mg by mouth daily.    . metFORMIN (GLUCOPHAGE) 1000 MG tablet TAKE 1 TABLET TWICE A DAY WITH MEALS (Patient taking differently: Take 1,000 mg by mouth 2 (two) times daily with a meal. ) 180 tablet 1  . nitroGLYCERIN (NITROSTAT) 0.4 MG SL tablet Place 1 tablet (0.4 mg total) under the tongue every 5 (five) minutes as needed for chest pain. 25 tablet 3  . rosuvastatin (CRESTOR) 40 MG tablet Take one tablet by mouth once daily for cholesterol (Patient taking differently: Take 40 mg by mouth at bedtime. ) 90 tablet 3  . traMADol (ULTRAM) 50 MG tablet Take 1 tablet (50 mg total) by mouth every 8 (eight) hours as needed. (Patient taking differently: Take 50 mg by mouth every 8 (eight) hours as needed (for pain.). ) 30 tablet 0  . hydrALAZINE (APRESOLINE) 25 MG tablet Take 1 tablet (25 mg total) by mouth 3 (three) times daily. 90 tablet 3  . hydrochlorothiazide (HYDRODIURIL) 25 MG tablet Take 25 mg by mouth daily.     No facility-administered medications prior to visit.      Allergies:   Iron; Vicodin [hydrocodone-acetaminophen]; Amiodarone; Norvasc [amlodipine besylate];  Promethazine; and Vioxx [rofecoxib]   Social History   Socioeconomic History  . Marital status: Widowed    Spouse name: Not on file  . Number of children: Not on file  . Years of education: Not on file  . Highest education level: Not on file  Occupational History  . Occupation: retired  Scientific laboratory technician  . Financial resource strain: Not hard at all  . Food insecurity:  Worry: Never true    Inability: Never true  . Transportation needs:    Medical: No    Non-medical: No  Tobacco Use  . Smoking status: Former Smoker    Packs/day: 0.30    Years: 57.00    Pack years: 17.10    Types: Cigarettes    Last attempt to quit: 11/19/2012    Years since quitting: 5.7  . Smokeless tobacco: Never Used  Substance and Sexual Activity  . Alcohol use: Yes    Comment: rarely, 1 a month  . Drug use: No  . Sexual activity: Not Currently  Lifestyle  . Physical activity:    Days per week: 0 days    Minutes per session: 0 min  . Stress: Only a little  Relationships  . Social connections:    Talks on phone: More than three times a week    Gets together: More than three times a week    Attends religious service: More than 4 times per year    Active member of club or organization: Yes    Attends meetings of clubs or organizations: More than 4 times per year    Relationship status: Widowed  Other Topics Concern  . Not on file  Social History Narrative  . Not on file     Family History:  The patient's family history includes Cancer in her other and sister; Diabetes in her brother, daughter, mother, other, and sister; Heart attack in her father and mother; Heart disease in her brother, brother, brother, father, mother, other, sister, sister, and sister; Other in her mother and other; Stroke in her brother, brother, and brother.   ROS:   Please see the history of present illness.    ROS All other systems reviewed and are negative.   PHYSICAL EXAM:   VS:  BP (!) 125/59   Pulse 65   Ht 5\' 2"   (1.575 m)   Wt 148 lb (67.1 kg)   BMI 27.07 kg/m    GEN: Well nourished, well developed, in no acute distress  HEENT: normal  Neck: no JVD, carotid bruits, or masses Cardiac: RRR; no murmurs, rubs, or gallops,no edema  Respiratory:  clear to auscultation bilaterally, normal work of breathing GI: soft, nontender, nondistended, + BS MS: no deformity or atrophy  Skin: warm and dry, no rash Neuro:  Alert and Oriented x 3, Strength and sensation are intact Psych: euthymic mood, full affect  Wt Readings from Last 3 Encounters:  08/06/18 148 lb (67.1 kg)  08/05/18 148 lb (67.1 kg)  07/31/18 150 lb (68 kg)      Studies/Labs Reviewed:   EKG:  EKG is not ordered today.    Recent Labs: 06/03/2018: ALT 6 07/15/2018: TSH 2.330 07/22/2018: BUN 18; Creatinine, Ser 1.26; Potassium 4.2; Sodium 134 08/05/2018: Hemoglobin 9.7; Platelets 273.0   Lipid Panel    Component Value Date/Time   CHOL 147 06/03/2018 0822   CHOL 159 02/06/2016 0831   TRIG 69 06/03/2018 0822   HDL 45 (L) 06/03/2018 0822   HDL 61 02/06/2016 0831   CHOLHDL 3.3 06/03/2018 0822   VLDL 14 04/15/2017 0906   LDLCALC 86 06/03/2018 0822    Additional studies/ records that were reviewed today include:   Renal US 07/04/2018 Renal:  Right: Abnormal right Resistive Index. Normal size right kidney.    Evidence of a greater than 60% stenosis of the right renal    artery. RRV flow present. Left: Abnormal left Resisitve Index. Normal size of left  kidney.    Evidence of a > 60% stenosis in the left renal artery. LRV    flow present. Mesenteric: 70 to 99% stenosis in the superior mesenteric artery and celiac artery. Areas of limited visceral study include left renal artery.       ASSESSMENT:    1. Renal artery stenosis ([place])   2. PAD (peripheral artery disease) ([place])   3. Renovascular hypertension   4. Essential hypertension   5. Coronary artery disease involving coronary bypass graft of native  heart without angina pectoris   6. Hyperlipidemia, unspecified hyperlipidemia type   7. PAF (paroxysmal atrial fibrillation) ([place])   8. Controlled type 2 diabetes mellitus without complication, without long-term current use of insulin ([place])   9. Stenosis of left subclavian vein   10. CKD (chronic kidney disease), stage III ([place])   11. Thoracic aortic aneurysm without rupture ([place])      PLAN:  In order of problems listed above:  1. Renal artery stenosis: Recently underwent bilateral renal artery stenting.  Blood pressure very well controlled.  Will decrease hydralazine to twice daily dosing and to decrease hydrochlorothiazide to 12.5 mg daily.  Will also obtain renal artery duplex prior to her next follow-up with Dr. Gwenlyn Found.  She was seen by Dr. Alvester Chou today, will need to continue aspirin and Plavix for at least 3 months.  If she needs colonoscopy, it is preferred to delay procedure until after 58-month before the patient to hold Plavix.  2. Thoracic aortic aneurysm: Followed by Dr. Oneida Alar of vascular surgery.  3. Left subclavian artery stenosis: Asymptomatic  4. CAD s/p CABG: Denies any chest pain.  Continue aspirin and Plavix  5. Hypertension: Blood pressure very well controlled  6. Hyperlipidemia: On Crestor and Zetia.  Last lipid panel July 2019 showed total cholesterol 147, HDL 45, LDL 86, triglycerides 69.  7. DM2 managed by primary care provider  8. PAF: No recent event.  Unable to take anticoagulation due to recurrent GI bleed.    Medication Adjustments/Labs and Tests Ordered: Current medicines are reviewed at length with the patient today.  Concerns regarding medicines are outlined above.  Medication changes, Labs and Tests ordered today are listed in the Patient Instructions below. Patient Instructions  Medication Instructions: [name], PA has recommended making the following medication changes: 1. DECREASE Hydralazine to TWICE daily 2. DECREASE Hydrochlorothiazide to 12.5  mg daily  Labwork: NONE ORDERED  Testing/Procedures: 1. Renal Duplex - Your physician has requested that you have a renal artery duplex. During this test, an ultrasound is used to evaluate blood flow to the kidneys. Allow one hour for this exam. Do not eat after midnight the day before and avoid carbonated beverages. Take your medications as you usually do.  Follow-up: Keep your previously scheduled appointment with Dr Gwenlyn Found.  If you need a refill on your cardiac medications before your next appointment, please call your pharmacy.    Hilbert Corrigan, [place]  08/08/2018 11:40 PM    [facility] [address], [place], [place]  [address] Phone: [phone]; Fax: [phone]

## 2018-08-06 NOTE — Telephone Encounter (Signed)
Patient care center 08/11/18 at 9 am with order for benadryl per protocol.   The patient has been notified of this information and all questions answered.

## 2018-08-06 NOTE — Patient Instructions (Signed)
Medication Instructions: [name], PA has recommended making the following medication changes: 1. DECREASE Hydralazine to TWICE daily 2. DECREASE Hydrochlorothiazide to 12.5 mg daily  Labwork: NONE ORDERED  Testing/Procedures: 1. Renal Duplex - Your physician has requested that you have a renal artery duplex. During this test, an ultrasound is used to evaluate blood flow to the kidneys. Allow one hour for this exam. Do not eat after midnight the day before and avoid carbonated beverages. Take your medications as you usually do.  Follow-up: Keep your previously scheduled appointment with Dr Gwenlyn Found.  If you need a refill on your cardiac medications before your next appointment, please call your pharmacy.

## 2018-08-07 LAB — HELICOBACTER PYLORI  SPECIAL ANTIGEN
MICRO NUMBER: 91183860
RESULT:: DETECTED — AB
SPECIMEN QUALITY: ADEQUATE

## 2018-08-08 ENCOUNTER — Encounter: Payer: Self-pay | Admitting: Physician Assistant

## 2018-08-11 ENCOUNTER — Other Ambulatory Visit: Payer: Self-pay

## 2018-08-11 ENCOUNTER — Ambulatory Visit (HOSPITAL_COMMUNITY)
Admission: RE | Admit: 2018-08-11 | Discharge: 2018-08-11 | Disposition: A | Discharge status: Home / Self Care | Payer: Medicare Other | Source: Ambulatory Visit | Attending: Gastroenterology | Admitting: Gastroenterology

## 2018-08-11 ENCOUNTER — Telehealth: Payer: Self-pay | Admitting: Gastroenterology

## 2018-08-11 DIAGNOSIS — D509 Iron deficiency anemia, unspecified: Secondary | ICD-10-CM | POA: Diagnosis not present

## 2018-08-11 DIAGNOSIS — A048 Other specified bacterial intestinal infections: Secondary | ICD-10-CM

## 2018-08-11 MED ORDER — OMEPRAZOLE 40 MG PO CPDR: 40.0000 mg | DELAYED_RELEASE_CAPSULE | Freq: Two times a day (BID) | ORAL | 0 refills | Status: DC

## 2018-08-11 MED ORDER — SODIUM CHLORIDE 0.9 % IV SOLN
INTRAVENOUS | Status: DC | PRN
  Administered 2018-08-11: 250 mL via INTRAVENOUS

## 2018-08-11 MED ORDER — DIPHENHYDRAMINE HCL 25 MG PO CAPS
50.0000 mg | ORAL_CAPSULE | Freq: Once | ORAL | Status: AC
  Administered 2018-08-11: 50 mg via ORAL
  Filled 2018-08-11: qty 2

## 2018-08-11 MED ORDER — AMOXICILLIN 500 MG PO TABS: 1000.0000 mg | ORAL_TABLET | Freq: Two times a day (BID) | ORAL | 0 refills | Status: DC

## 2018-08-11 MED ORDER — CLARITHROMYCIN 500 MG PO TABS: 500.0000 mg | ORAL_TABLET | Freq: Two times a day (BID) | ORAL | 0 refills | Status: DC

## 2018-08-11 MED ORDER — SODIUM CHLORIDE 0.9 % IV SOLN
510.0000 mg | Freq: Once | INTRAVENOUS | Status: AC
  Administered 2018-08-11: 510 mg via INTRAVENOUS
  Filled 2018-08-11: qty 17

## 2018-08-11 NOTE — Discharge Instructions (Signed)

## 2018-08-11 NOTE — Progress Notes (Signed)
PATIENT CARE CENTER NOTE  Diagnosis: Iron Deficiency Anemia    Provider: Dr. [name]   Procedure: IV Feraheme    Note: Patient received infusion of Feraheme. Due to previous reaction to PO Iron, patient received 50 mg PO Benadryl pre-infusion per order. Patient tolerated infusion with no adverse reaction.  Blood pressure elevated post-transfusion at 196/63. Patient reports that she did not take BP medications this morning. Called [facility] and reported elevated BP to Dr. Carollee Sires PA. Per PA, patient to take prescribed BP medications and continue to monitor BP at home. Patient advised. Discharge instructions given to patient. Patient alert, oriented,  ambulatory and discharged with daughter.

## 2018-08-12 NOTE — Telephone Encounter (Signed)
See result note.  

## 2018-08-18 ENCOUNTER — Other Ambulatory Visit: Payer: Self-pay | Admitting: Physician Assistant

## 2018-08-18 ENCOUNTER — Ambulatory Visit (HOSPITAL_COMMUNITY)
Admission: RE | Admit: 2018-08-18 | Discharge: 2018-08-18 | Disposition: A | Discharge status: Home / Self Care | Payer: Medicare Other | Source: Ambulatory Visit | Attending: Cardiovascular Disease | Admitting: Cardiovascular Disease

## 2018-08-18 ENCOUNTER — Telehealth: Payer: Self-pay | Admitting: Gastroenterology

## 2018-08-18 ENCOUNTER — Other Ambulatory Visit: Payer: Self-pay | Admitting: Cardiovascular Disease

## 2018-08-18 DIAGNOSIS — Z9889 Other specified postprocedural states: Secondary | ICD-10-CM

## 2018-08-18 DIAGNOSIS — I739 Peripheral vascular disease, unspecified: Secondary | ICD-10-CM

## 2018-08-18 DIAGNOSIS — I701 Atherosclerosis of renal artery: Secondary | ICD-10-CM

## 2018-08-18 DIAGNOSIS — I150 Renovascular hypertension: Secondary | ICD-10-CM

## 2018-08-18 NOTE — Telephone Encounter (Signed)
I spoke with patient this afternoon. Although not ideal and not the best way of approaching, we will attempt to try to use the antibiotics just once daily while keeping the PPI BID.  If she can tolerate a lower dose daily, I am hopeful we will not have to stop or interrupt her therapy.   She is aware of thought process. Can you reach out to her on Wednesday and see how she has done Kendra Todd? Thanks. Chester Holstein

## 2018-08-18 NOTE — Telephone Encounter (Signed)
The pt began to have nausea, vomiting, and lower abd pain since Iron infusion on Monday and starting abx for H pylori on Tues.  She is not sure which is causing the issues.  Please advise

## 2018-08-22 ENCOUNTER — Other Ambulatory Visit: Payer: Self-pay

## 2018-08-22 ENCOUNTER — Telehealth: Payer: Self-pay | Admitting: Gastroenterology

## 2018-08-22 ENCOUNTER — Inpatient Hospital Stay (HOSPITAL_COMMUNITY)
Admission: EM | Admit: 2018-08-22 | Discharge: 2018-08-30 | DRG: 683 | Disposition: A | Discharge status: Home Health | Payer: Medicare Other | Attending: Internal Medicine | Admitting: Internal Medicine

## 2018-08-22 DIAGNOSIS — K5900 Constipation, unspecified: Secondary | ICD-10-CM | POA: Diagnosis present

## 2018-08-22 DIAGNOSIS — I951 Orthostatic hypotension: Secondary | ICD-10-CM | POA: Diagnosis present

## 2018-08-22 DIAGNOSIS — E785 Hyperlipidemia, unspecified: Secondary | ICD-10-CM | POA: Diagnosis present

## 2018-08-22 DIAGNOSIS — R0989 Other specified symptoms and signs involving the circulatory and respiratory systems: Secondary | ICD-10-CM | POA: Diagnosis present

## 2018-08-22 DIAGNOSIS — I701 Atherosclerosis of renal artery: Secondary | ICD-10-CM | POA: Diagnosis present

## 2018-08-22 DIAGNOSIS — E871 Hypo-osmolality and hyponatremia: Secondary | ICD-10-CM

## 2018-08-22 DIAGNOSIS — R1032 Left lower quadrant pain: Secondary | ICD-10-CM | POA: Diagnosis present

## 2018-08-22 DIAGNOSIS — E881 Lipodystrophy, not elsewhere classified: Secondary | ICD-10-CM | POA: Diagnosis present

## 2018-08-22 DIAGNOSIS — R109 Unspecified abdominal pain: Secondary | ICD-10-CM

## 2018-08-22 DIAGNOSIS — M6250 Muscle wasting and atrophy, not elsewhere classified, unspecified site: Secondary | ICD-10-CM | POA: Diagnosis not present

## 2018-08-22 DIAGNOSIS — I509 Heart failure, unspecified: Secondary | ICD-10-CM

## 2018-08-22 DIAGNOSIS — I130 Hypertensive heart and chronic kidney disease with heart failure and stage 1 through stage 4 chronic kidney disease, or unspecified chronic kidney disease: Secondary | ICD-10-CM | POA: Diagnosis not present

## 2018-08-22 DIAGNOSIS — D509 Iron deficiency anemia, unspecified: Secondary | ICD-10-CM | POA: Diagnosis not present

## 2018-08-22 DIAGNOSIS — I2510 Atherosclerotic heart disease of native coronary artery without angina pectoris: Secondary | ICD-10-CM | POA: Diagnosis present

## 2018-08-22 DIAGNOSIS — I150 Renovascular hypertension: Secondary | ICD-10-CM | POA: Diagnosis present

## 2018-08-22 DIAGNOSIS — L989 Disorder of the skin and subcutaneous tissue, unspecified: Secondary | ICD-10-CM

## 2018-08-22 DIAGNOSIS — Z833 Family history of diabetes mellitus: Secondary | ICD-10-CM

## 2018-08-22 DIAGNOSIS — N179 Acute kidney failure, unspecified: Secondary | ICD-10-CM | POA: Diagnosis not present

## 2018-08-22 DIAGNOSIS — Z8672 Personal history of thrombophlebitis: Secondary | ICD-10-CM

## 2018-08-22 DIAGNOSIS — K551 Chronic vascular disorders of intestine: Secondary | ICD-10-CM | POA: Diagnosis present

## 2018-08-22 DIAGNOSIS — Z823 Family history of stroke: Secondary | ICD-10-CM

## 2018-08-22 DIAGNOSIS — B3781 Candidal esophagitis: Secondary | ICD-10-CM | POA: Diagnosis not present

## 2018-08-22 DIAGNOSIS — Z7901 Long term (current) use of anticoagulants: Secondary | ICD-10-CM

## 2018-08-22 DIAGNOSIS — I5032 Chronic diastolic (congestive) heart failure: Secondary | ICD-10-CM | POA: Diagnosis present

## 2018-08-22 DIAGNOSIS — Z87891 Personal history of nicotine dependence: Secondary | ICD-10-CM

## 2018-08-22 DIAGNOSIS — Z8249 Family history of ischemic heart disease and other diseases of the circulatory system: Secondary | ICD-10-CM

## 2018-08-22 DIAGNOSIS — I2721 Secondary pulmonary arterial hypertension: Secondary | ICD-10-CM | POA: Diagnosis present

## 2018-08-22 DIAGNOSIS — R531 Weakness: Secondary | ICD-10-CM | POA: Diagnosis not present

## 2018-08-22 DIAGNOSIS — R42 Dizziness and giddiness: Secondary | ICD-10-CM

## 2018-08-22 DIAGNOSIS — N183 Chronic kidney disease, stage 3 (moderate): Secondary | ICD-10-CM | POA: Diagnosis present

## 2018-08-22 DIAGNOSIS — Z7902 Long term (current) use of antithrombotics/antiplatelets: Secondary | ICD-10-CM

## 2018-08-22 DIAGNOSIS — M2450 Contracture, unspecified joint: Secondary | ICD-10-CM | POA: Diagnosis present

## 2018-08-22 DIAGNOSIS — Q8789 Other specified congenital malformation syndromes, not elsewhere classified: Secondary | ICD-10-CM

## 2018-08-22 DIAGNOSIS — I480 Paroxysmal atrial fibrillation: Secondary | ICD-10-CM | POA: Diagnosis present

## 2018-08-22 DIAGNOSIS — M6240 Contracture of muscle, unspecified site: Secondary | ICD-10-CM | POA: Diagnosis not present

## 2018-08-22 DIAGNOSIS — M049 Autoinflammatory syndrome, unspecified: Secondary | ICD-10-CM

## 2018-08-22 DIAGNOSIS — M793 Panniculitis, unspecified: Secondary | ICD-10-CM | POA: Diagnosis not present

## 2018-08-22 DIAGNOSIS — I716 Thoracoabdominal aortic aneurysm, without rupture: Secondary | ICD-10-CM | POA: Diagnosis present

## 2018-08-22 DIAGNOSIS — B9681 Helicobacter pylori [H. pylori] as the cause of diseases classified elsewhere: Secondary | ICD-10-CM | POA: Diagnosis present

## 2018-08-22 DIAGNOSIS — Z951 Presence of aortocoronary bypass graft: Secondary | ICD-10-CM

## 2018-08-22 DIAGNOSIS — A048 Other specified bacterial intestinal infections: Secondary | ICD-10-CM

## 2018-08-22 DIAGNOSIS — Z955 Presence of coronary angioplasty implant and graft: Secondary | ICD-10-CM

## 2018-08-22 DIAGNOSIS — R112 Nausea with vomiting, unspecified: Secondary | ICD-10-CM | POA: Diagnosis present

## 2018-08-22 DIAGNOSIS — Z8673 Personal history of transient ischemic attack (TIA), and cerebral infarction without residual deficits: Secondary | ICD-10-CM

## 2018-08-22 DIAGNOSIS — I959 Hypotension, unspecified: Secondary | ICD-10-CM | POA: Diagnosis not present

## 2018-08-22 DIAGNOSIS — Z7984 Long term (current) use of oral hypoglycemic drugs: Secondary | ICD-10-CM

## 2018-08-22 DIAGNOSIS — I252 Old myocardial infarction: Secondary | ICD-10-CM

## 2018-08-22 DIAGNOSIS — Z86718 Personal history of other venous thrombosis and embolism: Secondary | ICD-10-CM

## 2018-08-22 DIAGNOSIS — E1122 Type 2 diabetes mellitus with diabetic chronic kidney disease: Secondary | ICD-10-CM | POA: Diagnosis present

## 2018-08-22 LAB — CBC WITH DIFFERENTIAL/PLATELET
Abs Immature Granulocytes: 0.04 10*3/uL (ref 0.00–0.07)
BASOS ABS: 0 10*3/uL (ref 0.0–0.1)
Basophils Relative: 0 %
EOS PCT: 1 %
Eosinophils Absolute: 0.1 10*3/uL (ref 0.0–0.5)
HEMATOCRIT: 30.9 % — AB (ref 36.0–46.0)
HEMOGLOBIN: 9.3 g/dL — AB (ref 12.0–15.0)
Immature Granulocytes: 0 %
LYMPHS ABS: 1.1 10*3/uL (ref 0.7–4.0)
LYMPHS PCT: 11 %
MCH: 23.4 pg — ABNORMAL LOW (ref 26.0–34.0)
MCHC: 30.1 g/dL (ref 30.0–36.0)
MCV: 77.6 fL — ABNORMAL LOW (ref 80.0–100.0)
Monocytes Absolute: 1.3 10*3/uL — ABNORMAL HIGH (ref 0.1–1.0)
Monocytes Relative: 13 %
Neutro Abs: 7.4 10*3/uL (ref 1.7–7.7)
Neutrophils Relative %: 75 %
Platelets: 263 10*3/uL (ref 150–400)
RBC: 3.98 MIL/uL (ref 3.87–5.11)
RDW: 17.1 % — ABNORMAL HIGH (ref 11.5–15.5)
WBC: 10 10*3/uL (ref 4.0–10.5)
nRBC: 0 % (ref 0.0–0.2)

## 2018-08-22 LAB — I-STAT CG4 LACTIC ACID, ED: Lactic Acid, Venous: 1.32 mmol/L (ref 0.5–1.9)

## 2018-08-22 MED ORDER — ONDANSETRON 8 MG PO TBDP: 8.0000 mg | ORAL_TABLET | Freq: Three times a day (TID) | ORAL | 1 refills | Status: DC | PRN

## 2018-08-22 NOTE — ED Triage Notes (Signed)
Pt arrives from home, c/o for weakness and orthostatic hypotension. Pt reports that she got up to stand and found she could not take a step. Per [facility], pt's at-home BP read approximately what orthostatics were for [facility].  Orthos for [facility]: Lying 126/52 Sitting 72/42, standing 68/32 Pt is Aox4 on arrival, c/o LLQ pain.  Pt has hx hypertension prior to kidney stents being placed approximately three weeks ago

## 2018-08-22 NOTE — Telephone Encounter (Signed)
pt states that she has tried to take the abx 1 time daily and continues to have nausea and vomiting.  She is unable to keep anything down.  I advised her to stop the abx and keep appt she has scheduled for Monday with Anderson Malta. She would like to have something for nausea.  Can we send her zofran?

## 2018-08-22 NOTE — Telephone Encounter (Signed)
The patient has been notified of this information and all questions answered.

## 2018-08-22 NOTE — Telephone Encounter (Signed)
Please send in Zofran 8 mg Sublingual 30 tabs 1 refill. Thanks.

## 2018-08-22 NOTE — ED Provider Notes (Signed)
[facility] EMERGENCY DEPARTMENT Provider Note   CSN: [id number] Arrival date & time: 08/22/18  2304     History   Chief Complaint Chief Complaint  Patient presents with  . Weakness    HPI Kendra Todd is a 77 y.o. female.  The history is provided by the patient.  She has complicated past history that includes renovascular hypertension, coronary artery disease hyperlipidemia, paroxysmal atrial fibrillation, peripheral vascular disease, diabetes, stroke, diastolic heart failure, abdominal aortic aneurysm and comes in with weakness and dizziness which started this evening.  She had been fine during the day, but this evening noted that she got dizzy when she stood up and was very unsteady on her feet.  She had been having some difficulty with nausea and vomiting over the past several days and has been having left lower quadrant pain for about the last week.  EMS reported orthostatic vital signs showing blood pressure dropping from 126/52 to 68/32.  She denies chest pain.  Abdominal pain is rated at 4/10.  She has not noticed any blood in her stool or black or tarry bowel movements.  Past Medical History:  Diagnosis Date  . Abdominal pain, epigastric   . Acute upper respiratory infections of unspecified site   . Arthritis   . Benign paroxysmal positional vertigo   . CAD (coronary artery disease)    prior coronary stenting  . Candidiasis of vulva and vagina   . Carotid artery disease ([place]) 2001   s/p Bilateral CEA, after CVA  . Cervicalgia   . Decreased pedal pulses 06/04/2011   doppler - bilateral ABIs no evidence of insufficiency; L CIA slightly elevated velocities 20-30% diameter reduction;   . Dyspnea 03/13/2006   Myoview - EF 76%; mild ischemia in apical lateral region, less severe than previous study in 2002  . Edema   . H/O carotid endarterectomy 03/06/2012   CT angio -high grade stenosis of proximal R internal carotid w/ lg posterior ulceration or dissection  flap; near occlusive stenosis at origin of nondominantproximal L vertebral artery; <50% stenosis of proximal R vertebral and proximal L subclavian artery  . H/O endarterectomy 09/11/2012   patent L and R carotid sites, R 60-79% restenosed ICA; L <40% restenosed ICA  . H/O endarterectomy 01/08/2012   doppler - R distal common carotid/proximal ICA stenosed 80-99%, L 0-39%  . HTN (hypertension) 11/27/2012  . Hyperlipidemia   . Limb pain 05/02/2011   doppler - no evidence of thrombus of thrombophlebitis  . Long term (current) use of anticoagulants   . Loss of weight   . Lumbago   . Myalgia and myositis, unspecified   . Myocardial infarction ([place])   . Nonspecific abnormal results of liver function study   . Nontoxic uninodular goiter   . Osteoarthrosis, unspecified whether generalized or localized, unspecified site   . Other malaise and fatigue   . Other manifestations of vitamin A deficiency   . PAF (paroxysmal atrial fibrillation) ([place]) 02/21/2011  . Pain in joint, pelvic region and thigh   . Pain in limb   . Peripheral vascular disease ([place])   . Phlebitis and thrombophlebitis of superficial vessels of lower extremities   . Primary localized osteoarthrosis, hand   . Pulmonary HTN ([place]) 12/03/2012  . Renal artery stenosis ([place])   . Routine gynecological examination   . Spinal stenosis, unspecified region other than cervical   . Stenosis of artery ([place]) 06/03/2012   doppler- most distal aspect of abd aorta no aneurysmal  dilation; bilateral ABIs - mild L arterial insufficiency, L CIA narrowing w/ 50-69% diameter reduction; L and R SFA both w/ 0-49% diameter reductions  . Stroke ([place]) 2001  . Superficial vein thrombosis 05/11/2015   Left forehead  . Tobacco use disorder   . TR (tricuspid regurgitation), severe 12/02/12 for repair with CABG 12/03/2012  . Type II or unspecified type diabetes mellitus with peripheral circulatory disorders, not stated as uncontrolled(250.70)   . Unspecified cataract     . Unspecified disorder of iris and ciliary body   . Unspecified vitamin D deficiency   . Urinary tract infection, site not specified     Patient Active Problem List   Diagnosis Date Noted  . LLQ abdominal pain 08/06/2018  . Long term (current) use of antithrombotics/antiplatelets 08/06/2018  . Other dysphagia 08/06/2018  . Renal artery stenosis ([place]) 07/21/2018  . Other fatigue 07/10/2018  . Melena 07/10/2018  . Labile blood pressure 07/10/2018  . Renovascular hypertension 06/23/2018  . Pulmonary artery hypertension ([place]) 06/23/2018  . Aortic mural thrombus ([place]) 02/13/2018  . Thoracic back pain 02/05/2018  . Hypertensive urgency 02/03/2018  . Chest pain 02/03/2018  . Anemia 01/20/2018  . Sacroiliitis ([place]) 04/10/2017  . Pruritus 04/10/2017  . Edema 12/12/2016  . Chronic diastolic CHF (congestive heart failure), NYHA class 2 ([place]) 09/25/2016  . Thoracoabdominal aortic aneurysm (TAAA) without rupture ([place]) 02/27/2016  . CKD (chronic kidney disease) stage 3, GFR 30-59 ml/min (HCC) 02/27/2016  . Herpes zoster 05/25/2015  . Superficial vein thrombosis 05/11/2015  . Back pain 10/26/2014  . Osteoarthritis 10/26/2014  . AAA (abdominal aortic aneurysm) without rupture ([place]) 07/21/2014  . Pain in joint, shoulder region 01/20/2014  . Bilateral renal artery stenosis ([place]) 10/22/2013  . Palpitations 06/03/2013  . Atrial fibrillation ([place]) 04/25/2013  . TR (tricuspid regurgitation), severe 12/02/12 for repair with CABG 12/03/2012  . Pulmonary HTN ([place]) 12/03/2012  . NSTEMI (non-ST elevated myocardial infarction) ([place]) 11/27/2012  . PAD (peripheral artery disease) ([place]) 11/27/2012  . CAD (coronary artery disease): 1993 PTCA to circumflex.  2002 2 BMS stents to ostial, S/P CABG x 3 12/04/12 11/27/2012  . DM (diabetes mellitus), type 2 with renal complications ([place]) 16/08/9603  . HTN (hypertension) 11/27/2012  . Hypercholesterolemia 11/27/2012  . Bilateral carotid artery stenosis  02/14/2012    Past Surgical History:  Procedure Laterality Date  . ABDOMINAL HYSTERECTOMY  1975  . ANGIOPLASTY  1984 and 1982  . APPENDECTOMY  1954  . ARCH AORTOGRAM  03/30/2011   40% recurrent stenosis at origin of R proximal carotid patch, shelf like recurrent stenosis w/in midsection of patch that does not appear to be flow limiting; normal non-stenosed great vessel origins  . BACK SURGERY  '84,'86, '87   three previous surgeries  . CAROTID ENDARTERECTOMY  2001   Bilateral  . CORONARY ARTERY BYPASS GRAFT  12/04/2012   Procedure: CORONARY ARTERY BYPASS GRAFTING (CABG);  Surgeon: Ivin Poot, MD; LIMA-LAD, SVG-OM, SVG-RCA  . INTRAOPERATIVE TRANSESOPHAGEAL ECHOCARDIOGRAM  12/04/2012   Procedure: INTRAOPERATIVE TRANSESOPHAGEAL ECHOCARDIOGRAM;  Surgeon: Ivin Poot, MD;  Location: [facility];  Service: Open Heart Surgery;  Laterality: N/A;  . KIDNEY STONE SURGERY     Removal  . LEFT HEART CATHETERIZATION WITH CORONARY ANGIOGRAM N/A 11/28/2012   Procedure: LEFT HEART CATHETERIZATION WITH CORONARY ANGIOGRAM;  Surgeon: Leonie Man, MD;  [facility] C[facility] LAB;  Service: Cardiovascular;  Laterality: N/A;  . PERCUTANEOUS CORONARY STENT INTERVENTION (PCI-S)  2002   2 BMS in ostial/Prox & mid  RCA (mid 2.5 mm 20x  mm, & 2.75 mm x  8 mm ostial)  . PERIPHERAL VASCULAR INTERVENTION  07/21/2018   Procedure: PERIPHERAL VASCULAR INTERVENTION;  Surgeon: Lorretta Harp, MD;  Location: [facility] CV LAB;  Service: Cardiovascular;;  bilateral renal stents  . RENAL ANGIOGRAM N/A 11/30/2013   Procedure: RENAL ANGIOGRAM;  Surgeon: Lorretta Harp, MD;  Location[facility] CATH LAB;  Service: Cardiovascular;  Laterality: N/A;  . RENAL ANGIOGRAPHY N/A 07/21/2018   Procedure: RENAL ANGIOGRAPHY;  Surgeon: Lorretta Harp, MD;  Location: [facility] CV LAB;  Service: Cardiovascular;  Laterality: N/A;  . RIGHT HEART CATHETERIZATION N/A 12/02/2012   Procedure: RIGHT HEART CATH;  Surgeon: Troy Sine, MD;   Location: [facility] CATH LAB;  Service: Cardiovascular;  Laterality: N/A;  . TEE WITH CARDIOVERSION  01/23/2011   EF 51-70%; grade 2 diastolic dysfunction, elevated mean LA filling pressure, RV systolic pressure increased consistent w/ mod pulmonary hypertension     OB History   None      Home Medications    Prior to Admission medications   Medication Sig Start Date End Date Taking? Authorizing Provider  alendronate (FOSAMAX) 70 MG tablet Take 70 mg by mouth every Friday.  07/13/16   [provider]  amoxicillin (AMOXIL) 500 MG tablet Take 2 tablets (1,000 mg total) by mouth 2 (two) times daily for 14 days. 08/11/18 08/25/18  Mansouraty, Telford Nab., MD  aspirin 81 MG tablet Take 81 mg by mouth daily.     [provider]  bimatoprost (LUMIGAN) 0.01 % SOLN Place 1 drop into both eyes at bedtime.    [provider]  carvedilol (COREG) 25 MG tablet TAKE ONE-HALF (1/2) TABLET TWICE A DAY WITH MEALS (NEEDS OFFICE VISIT) Patient taking differently: Take 12.5 mg by mouth 2 (two) times daily.  04/21/18   Croitoru, Mihai, MD  clarithromycin (BIAXIN) 500 MG tablet Take 1 tablet (500 mg total) by mouth 2 (two) times daily. 08/11/18   Mansouraty, Telford Nab., MD  clopidogrel (PLAVIX) 75 MG tablet Take 1 tablet (75 mg total) by mouth daily with breakfast. 07/23/18   Daune Perch, NP  dicyclomine (BENTYL) 10 MG capsule Take 3 times daily as needed for spasm. 08/05/18   Mansouraty, Telford Nab., MD  ezetimibe (ZETIA) 10 MG tablet Take 10 mg by mouth at bedtime.     [provider]  furosemide (LASIX) 20 MG tablet Take 20 mg by mouth daily as needed (for fluid retention.).  06/04/18   [provider]  hydrALAZINE (APRESOLINE) 25 MG tablet Take 1 tablet (25 mg total) by mouth 2 (two) times daily. 08/06/18   [name], PA  hydrochlorothiazide (HYDRODIURIL) 12.5 MG tablet Take 1 tablet (12.5 mg total) by mouth daily. 08/06/18   [name], PA  meloxicam (MOBIC) 7.5 MG tablet Take  7.5 mg by mouth daily.    [provider]  metFORMIN (GLUCOPHAGE) 1000 MG tablet TAKE 1 TABLET TWICE A DAY WITH MEALS Patient taking differently: Take 1,000 mg by mouth 2 (two) times daily with a meal.  08/02/16   Nyoka Cowden Viviann Spare, MD  nitroGLYCERIN (NITROSTAT) 0.4 MG SL tablet Place 1 tablet (0.4 mg total) under the tongue every 5 (five) minutes as needed for chest pain. 01/31/18   Croitoru, Mihai, MD  omeprazole (PRILOSEC) 40 MG capsule Take 1 capsule (40 mg total) by mouth 2 (two) times daily. 08/11/18   Mansouraty, Telford Nab., MD  ondansetron (ZOFRAN ODT) 8 MG disintegrating tablet Take  1 tablet (8 mg total) by mouth every 8 (eight) hours as needed for nausea or vomiting. 08/22/18   Mansouraty, Telford Nab., MD  rosuvastatin (CRESTOR) 40 MG tablet Take one tablet by mouth once daily for cholesterol Patient taking differently: Take 40 mg by mouth at bedtime.  10/23/17   Reed, Tiffany L, DO  traMADol (ULTRAM) 50 MG tablet Take 1 tablet (50 mg total) by mouth every 8 (eight) hours as needed. Patient taking differently: Take 50 mg by mouth every 8 (eight) hours as needed (for pain.).  03/05/18   Lauree Chandler, NP    Family History Family History  Problem Relation Age of Onset  . Other Mother        CVA  . Diabetes Mother   . Heart disease Mother        Heart disease before age 61  . Heart attack Mother   . Heart disease Father        Heart Disease before age 71  . Heart attack Father   . Heart disease Sister        Amputation  . Diabetes Sister   . Heart disease Brother        Heart disease before age 79  . Diabetes Brother   . Stroke Brother   . Stroke Brother   . Heart disease Brother   . Stroke Brother   . Heart disease Brother   . Heart disease Sister   . Heart disease Sister   . Diabetes Daughter   . Cancer Sister   . Diabetes Other   . Cancer Other        breast  . Heart disease Other        cad  . Other Other        cva  . Colon cancer Neg Hx   .  Esophageal cancer Neg Hx   . Inflammatory bowel disease Neg Hx   . Liver disease Neg Hx   . Pancreatic cancer Neg Hx   . Rectal cancer Neg Hx   . Stomach cancer Neg Hx     Social History Social History   Tobacco Use  . Smoking status: Former Smoker    Packs/day: 0.30    Years: 57.00    Pack years: 17.10    Types: Cigarettes    Last attempt to quit: 11/19/2012    Years since quitting: 5.7  . Smokeless tobacco: Never Used  Substance Use Topics  . Alcohol use: Yes    Comment: rarely, 1 a month  . Drug use: No     Allergies   Iron; Vicodin [hydrocodone-acetaminophen]; Amiodarone; Norvasc [amlodipine besylate]; Promethazine; and Vioxx [rofecoxib]   Review of Systems Review of Systems  All other systems reviewed and are negative.    Physical Exam Updated Vital Signs BP (!) 115/49   Pulse 79   Temp 98.9 F (37.2 C) (Oral)   Resp (!) 22   Ht 5\' 2"  (1.575 m)   Wt 63.5 kg   SpO2 100%   BMI 25.61 kg/m   Physical Exam  Nursing note and vitals reviewed.  77 year old female, resting comfortably and in no acute distress. Vital signs are significant for elevated respiratory rate. Oxygen saturation is 100%, which is normal. Head is normocephalic and atraumatic. PERRLA, EOMI. Oropharynx is clear. Neck is nontender and supple without adenopathy or JVD. Back is nontender and there is no CVA tenderness. Lungs are clear without rales, wheezes, or rhonchi. Chest is nontender. Heart  has regular rate and rhythm without murmur. Abdomen is soft, flat, with moderate tenderness in the left lower quadrant and mild tenderness in the left upper quadrant and right lower quadrant.  There is no rebound or guarding.  There is no masses or hepatosplenomegaly and peristalsis is normoactive. Extremities have no cyanosis or edema, full range of motion is present. Skin is warm and dry without rash. Neurologic: Mental status is normal, cranial nerves are intact, there are no motor or sensory  deficits.  ED Treatments / Results  Labs (all labs ordered are listed, but only abnormal results are displayed) Labs Reviewed  COMPREHENSIVE METABOLIC PANEL - Abnormal; Notable for the following components:      Result Value   Sodium 131 (*)    CO2 21 (*)    Glucose, Bld 175 (*)    Creatinine, Ser 2.29 (*)    Calcium 8.7 (*)    Albumin 2.7 (*)    AST 50 (*)    GFR calc non Af Amer 19 (*)    GFR calc Af Amer 22 (*)    All other components within normal limits  CBC WITH DIFFERENTIAL/PLATELET - Abnormal; Notable for the following components:   Hemoglobin 9.3 (*)    HCT 30.9 (*)    MCV 77.6 (*)    MCH 23.4 (*)    RDW 17.1 (*)    Monocytes Absolute 1.3 (*)    All other components within normal limits  LIPASE, BLOOD  URINALYSIS, ROUTINE W REFLEX MICROSCOPIC  TROPONIN I  I-STAT CG4 LACTIC ACID, ED  CBG MONITORING, ED  TYPE AND SCREEN    EKG EKG Interpretation  Date/Time:  Friday August 22 2018 23:10:38 EDT Ventricular Rate:  79 PR Interval:    QRS Duration: 96 QT Interval:  428 QTC Calculation: 491 R Axis:   39 Text Interpretation:  Sinus rhythm Borderline prolonged QT interval When compared with ECG of 02/02/2018, QT has lengthened Confirmed by Delora Fuel ([id number]) on 08/23/2018 12:34:33 AM   Procedures Procedures   Medications Ordered in ED Medications  sodium chloride 0.9 % bolus 1,000 mL (1,000 mLs Intravenous New Bag/Given 08/23/18 0154)     Initial Impression / Assessment and Plan / ED Course  I have reviewed the triage vital signs and the nursing notes.  Pertinent labs & imaging results that were available during my care of the patient were reviewed by me and considered in my medical decision making (see chart for details).  Orthostatic dizziness and weakness.  Old records are reviewed confirming recent placement of renal artery stents, concern of need for colonoscopy to evaluate GI blood loss but colonoscopy needs to be deferred for 3 months for patient  to complete initial course of clopidogrel.  Will check screening labs, ECG.  May need to consider CT of abdomen and pelvis to evaluate abdominal pain.  However, it is noted that she has had numerous abdominal CT scans for evaluation of her aorta, most recently July 24.  No mention was made of abdominal pathology.  Labs show stable anemia, mild hyponatremia which is probably not clinically significant.  Creatinine is noted to have increased significantly compared with last month.  Mild elevation of AST noted of uncertain significance, and not clinically significant.  Orthostatic vital signs do show persistent drop in systolic blood pressure, but not nearly as severe as was noted at home by EMS.  ECG shows no acute changes.  IV fluids are ordered, and case is discussed with Dr. Alcario Drought of [facility]  hospitalist who agrees to admit the patient.  Final Clinical Impressions(s) / ED Diagnoses   Final diagnoses:  Orthostatic dizziness  Acute kidney injury (nontraumatic) (HCC)  Hyponatremia  Joint contracture, muscle atrophy, microcytic anemia, and panniculitis-induced lipodystrophy syndrome [facility])    ED Discharge Orders    None       Delora Fuel, MD 39/79/53 [phone]

## 2018-08-23 ENCOUNTER — Encounter (HOSPITAL_COMMUNITY): Payer: Self-pay

## 2018-08-23 ENCOUNTER — Other Ambulatory Visit: Payer: Self-pay

## 2018-08-23 DIAGNOSIS — R0989 Other specified symptoms and signs involving the circulatory and respiratory systems: Secondary | ICD-10-CM | POA: Diagnosis not present

## 2018-08-23 DIAGNOSIS — E871 Hypo-osmolality and hyponatremia: Secondary | ICD-10-CM

## 2018-08-23 DIAGNOSIS — R1032 Left lower quadrant pain: Secondary | ICD-10-CM | POA: Diagnosis not present

## 2018-08-23 DIAGNOSIS — R112 Nausea with vomiting, unspecified: Secondary | ICD-10-CM | POA: Diagnosis not present

## 2018-08-23 DIAGNOSIS — I951 Orthostatic hypotension: Secondary | ICD-10-CM | POA: Diagnosis not present

## 2018-08-23 DIAGNOSIS — I2510 Atherosclerotic heart disease of native coronary artery without angina pectoris: Secondary | ICD-10-CM | POA: Diagnosis not present

## 2018-08-23 DIAGNOSIS — I701 Atherosclerosis of renal artery: Secondary | ICD-10-CM | POA: Diagnosis not present

## 2018-08-23 DIAGNOSIS — N179 Acute kidney failure, unspecified: Secondary | ICD-10-CM | POA: Diagnosis not present

## 2018-08-23 LAB — COMPREHENSIVE METABOLIC PANEL
ALBUMIN: 2.7 g/dL — AB (ref 3.5–5.0)
ALK PHOS: 60 U/L (ref 38–126)
ALT: 28 U/L (ref 0–44)
AST: 50 U/L — AB (ref 15–41)
Anion gap: 12 (ref 5–15)
BUN: 22 mg/dL (ref 8–23)
CALCIUM: 8.7 mg/dL — AB (ref 8.9–10.3)
CO2: 21 mmol/L — AB (ref 22–32)
CREATININE: 2.29 mg/dL — AB (ref 0.44–1.00)
Chloride: 98 mmol/L (ref 98–111)
GFR calc Af Amer: 22 mL/min — ABNORMAL LOW (ref >60)
GFR calc non Af Amer: 19 mL/min — ABNORMAL LOW (ref >60)
GLUCOSE: 175 mg/dL — AB (ref 70–99)
Potassium: 4.7 mmol/L (ref 3.5–5.1)
SODIUM: 131 mmol/L — AB (ref 135–145)
Total Bilirubin: 0.8 mg/dL (ref 0.3–1.2)
Total Protein: 6.5 g/dL (ref 6.5–8.1)

## 2018-08-23 LAB — URINALYSIS, ROUTINE W REFLEX MICROSCOPIC
BACTERIA UA: NONE SEEN
BILIRUBIN URINE: NEGATIVE
Glucose, UA: 50 mg/dL — AB
HGB URINE DIPSTICK: NEGATIVE
Ketones, ur: NEGATIVE mg/dL
LEUKOCYTES UA: NEGATIVE
Nitrite: NEGATIVE
PROTEIN: 100 mg/dL — AB
Specific Gravity, Urine: 1.014 (ref 1.005–1.030)
pH: 7 (ref 5.0–8.0)

## 2018-08-23 LAB — GLUCOSE, CAPILLARY
GLUCOSE-CAPILLARY: 123 mg/dL — AB (ref 70–99)
GLUCOSE-CAPILLARY: 186 mg/dL — AB (ref 70–99)
Glucose-Capillary: 97 mg/dL (ref 70–99)
Glucose-Capillary: 100 mg/dL — ABNORMAL HIGH (ref 70–99)

## 2018-08-23 LAB — BASIC METABOLIC PANEL
Anion gap: 8 (ref 5–15)
BUN: 22 mg/dL (ref 8–23)
CALCIUM: 8.4 mg/dL — AB (ref 8.9–10.3)
CO2: 22 mmol/L (ref 22–32)
CREATININE: 2.03 mg/dL — AB (ref 0.44–1.00)
Chloride: 102 mmol/L (ref 98–111)
GFR, EST AFRICAN AMERICAN: 26 mL/min — AB (ref >60)
GFR, EST NON AFRICAN AMERICAN: 22 mL/min — AB (ref >60)
Glucose, Bld: 130 mg/dL — ABNORMAL HIGH (ref 70–99)
Potassium: 4 mmol/L (ref 3.5–5.1)
Sodium: 132 mmol/L — ABNORMAL LOW (ref 135–145)

## 2018-08-23 LAB — LIPASE, BLOOD: Lipase: 35 U/L (ref 11–51)

## 2018-08-23 LAB — CBG MONITORING, ED: GLUCOSE-CAPILLARY: 151 mg/dL — AB (ref 70–99)

## 2018-08-23 LAB — TYPE AND SCREEN
ABO/RH(D): A POS
ANTIBODY SCREEN: NEGATIVE

## 2018-08-23 MED ORDER — ONDANSETRON HCL 4 MG PO TABS: 4.0000 mg | ORAL_TABLET | Freq: Four times a day (QID) | ORAL | Status: DC | PRN

## 2018-08-23 MED ORDER — PANTOPRAZOLE SODIUM 40 MG PO TBEC
80.0000 mg | DELAYED_RELEASE_TABLET | Freq: Two times a day (BID) | ORAL | Status: DC
  Administered 2018-08-23 – 2018-08-24 (×4): 80 mg via ORAL
  Filled 2018-08-23 (×4): qty 2

## 2018-08-23 MED ORDER — INSULIN ASPART 100 UNIT/ML ~~LOC~~ SOLN
0.0000 [IU] | SUBCUTANEOUS | Status: DC
  Administered 2018-08-23: 2 [IU] via SUBCUTANEOUS
  Administered 2018-08-23: 1 [IU] via SUBCUTANEOUS
  Administered 2018-08-24 (×2): 2 [IU] via SUBCUTANEOUS
  Administered 2018-08-25 (×2): 1 [IU] via SUBCUTANEOUS
  Administered 2018-08-25: 2 [IU] via SUBCUTANEOUS
  Administered 2018-08-26 – 2018-08-28 (×5): 1 [IU] via SUBCUTANEOUS
  Administered 2018-08-29: 2 [IU] via SUBCUTANEOUS

## 2018-08-23 MED ORDER — HYDRALAZINE HCL 25 MG PO TABS
25.0000 mg | ORAL_TABLET | Freq: Two times a day (BID) | ORAL | Status: DC
  Administered 2018-08-23 – 2018-08-29 (×13): 25 mg via ORAL
  Filled 2018-08-23 (×13): qty 1

## 2018-08-23 MED ORDER — ACETAMINOPHEN 650 MG RE SUPP: 650.0000 mg | Freq: Four times a day (QID) | RECTAL | Status: DC | PRN

## 2018-08-23 MED ORDER — SODIUM CHLORIDE 0.9 % IV SOLN
INTRAVENOUS | Status: DC
  Administered 2018-08-23 – 2018-08-26 (×9): via INTRAVENOUS

## 2018-08-23 MED ORDER — HEPARIN SODIUM (PORCINE) 5000 UNIT/ML IJ SOLN
5000.0000 [IU] | Freq: Three times a day (TID) | INTRAMUSCULAR | Status: DC
  Administered 2018-08-23 – 2018-08-24 (×5): 5000 [IU] via SUBCUTANEOUS
  Filled 2018-08-23 (×5): qty 1

## 2018-08-23 MED ORDER — BOOST / RESOURCE BREEZE PO LIQD CUSTOM
1.0000 | Freq: Three times a day (TID) | ORAL | Status: DC
  Administered 2018-08-23 – 2018-08-30 (×18): 1 via ORAL

## 2018-08-23 MED ORDER — ASPIRIN 81 MG PO CHEW
81.0000 mg | CHEWABLE_TABLET | Freq: Every day | ORAL | Status: DC
  Administered 2018-08-23 – 2018-08-30 (×8): 81 mg via ORAL
  Filled 2018-08-23 (×8): qty 1

## 2018-08-23 MED ORDER — DICYCLOMINE HCL 10 MG PO CAPS
10.0000 mg | ORAL_CAPSULE | Freq: Three times a day (TID) | ORAL | Status: DC | PRN
  Filled 2018-08-23: qty 1

## 2018-08-23 MED ORDER — TRAMADOL HCL 50 MG PO TABS
50.0000 mg | ORAL_TABLET | Freq: Three times a day (TID) | ORAL | Status: DC | PRN
  Administered 2018-08-23 – 2018-08-28 (×9): 50 mg via ORAL
  Filled 2018-08-23 (×10): qty 1

## 2018-08-23 MED ORDER — LATANOPROST 0.005 % OP SOLN
1.0000 [drp] | Freq: Every day | OPHTHALMIC | Status: DC
  Administered 2018-08-23 – 2018-08-29 (×7): 1 [drp] via OPHTHALMIC
  Filled 2018-08-23: qty 2.5

## 2018-08-23 MED ORDER — EZETIMIBE 10 MG PO TABS
10.0000 mg | ORAL_TABLET | Freq: Every day | ORAL | Status: DC
  Administered 2018-08-23 – 2018-08-29 (×7): 10 mg via ORAL
  Filled 2018-08-23 (×7): qty 1

## 2018-08-23 MED ORDER — ROSUVASTATIN CALCIUM 20 MG PO TABS
40.0000 mg | ORAL_TABLET | Freq: Every day | ORAL | Status: DC
  Administered 2018-08-23 – 2018-08-29 (×7): 40 mg via ORAL
  Filled 2018-08-23 (×2): qty 2
  Filled 2018-08-23 (×2): qty 1
  Filled 2018-08-23 (×2): qty 2
  Filled 2018-08-23: qty 1

## 2018-08-23 MED ORDER — SODIUM CHLORIDE 0.9 % IV BOLUS
1000.0000 mL | Freq: Once | INTRAVENOUS | Status: AC
  Administered 2018-08-23: 1000 mL via INTRAVENOUS

## 2018-08-23 MED ORDER — CLOPIDOGREL BISULFATE 75 MG PO TABS
75.0000 mg | ORAL_TABLET | Freq: Every day | ORAL | Status: DC
  Administered 2018-08-23 – 2018-08-26 (×4): 75 mg via ORAL
  Filled 2018-08-23 (×5): qty 1

## 2018-08-23 MED ORDER — CARVEDILOL 12.5 MG PO TABS
12.5000 mg | ORAL_TABLET | Freq: Two times a day (BID) | ORAL | Status: DC
  Administered 2018-08-23 – 2018-08-29 (×14): 12.5 mg via ORAL
  Filled 2018-08-23 (×14): qty 1

## 2018-08-23 MED ORDER — ONDANSETRON HCL 4 MG/2ML IJ SOLN
4.0000 mg | Freq: Four times a day (QID) | INTRAMUSCULAR | Status: DC | PRN
  Administered 2018-08-24 – 2018-08-25 (×2): 4 mg via INTRAVENOUS
  Filled 2018-08-23 (×2): qty 2

## 2018-08-23 MED ORDER — ACETAMINOPHEN 325 MG PO TABS
650.0000 mg | ORAL_TABLET | Freq: Four times a day (QID) | ORAL | Status: DC | PRN
  Administered 2018-08-23: 650 mg via ORAL
  Filled 2018-08-23: qty 2

## 2018-08-23 NOTE — H&P (Addendum)
History and Physical    Kendra Todd:[id number] DOB: 1941/02/23 DOA: 08/22/2018  PCP: [name], DO  Patient coming from: Home  I have personally briefly reviewed patient's old medical records in [place]  Chief Complaint: Weakness, dizziness  HPI: Kendra Todd is a 77 y.o. female with medical history significant of RAS, CAD, HLD, PAF, DM2, HTN, Stroke, AAA.  Patient presents to the ED with c/o weakness and dizziness that onset this evening.  3 weeks ago or so she had stenting for B renal artery stenosis (RAS).  Since that time she has been having LLQ abdominal pain.  She saw Dr. [name] with [facility] on 10/1.  He did note that symptoms not typical mesenteric ischemia however that this was still considered a possibility.  Recd endoscopic evaluation though patient was concerned regarding risks of doing this as she had to be on Plavix for 3 months following RAS procedure (see his note for details).  Pain is moderate in severity and 4/10.  They tried treating her for H. Pylori with course of ABx, she remains on PPI.  She received iron infusion on 10/7 and started ABx on 10/8.  Since that time she has been having N/V which has been persistent.  GI tried reducing ABx to once daily on 10/14 then stopping them yesterday 10/18 when that didn't work.  Yesterday however she developed weakness and dizziness, worse with standing.  EMS called, patient found to have severe orthostatic hypotension with BP dropping from 126/52 all the way to 68/32 when standing.  No CP, abd pain is 4/10 in LLQ.  No blood in stool nor melena.   ED Course: Creat 2.2 up from 1.5 baseline.  1L NS bolus given in ED.  Hospitalist asked to admit.   Review of Systems: As per HPI otherwise 10 point review of systems negative.   Past Medical History:  Diagnosis Date  . Abdominal pain, epigastric   . Acute upper respiratory infections of unspecified site   . Arthritis   . Benign paroxysmal positional  vertigo   . CAD (coronary artery disease)    prior coronary stenting  . Candidiasis of vulva and vagina   . Carotid artery disease ([place]) 2001   s/p Bilateral CEA, after CVA  . Cervicalgia   . Decreased pedal pulses 06/04/2011   doppler - bilateral ABIs no evidence of insufficiency; L CIA slightly elevated velocities 20-30% diameter reduction;   . Dyspnea 03/13/2006   Myoview - EF 76%; mild ischemia in apical lateral region, less severe than previous study in 2002  . Edema   . H/O carotid endarterectomy 03/06/2012   CT angio -high grade stenosis of proximal R internal carotid w/ lg posterior ulceration or dissection flap; near occlusive stenosis at origin of nondominantproximal L vertebral artery; <50% stenosis of proximal R vertebral and proximal L subclavian artery  . H/O endarterectomy 09/11/2012   patent L and R carotid sites, R 60-79% restenosed ICA; L <40% restenosed ICA  . H/O endarterectomy 01/08/2012   doppler - R distal common carotid/proximal ICA stenosed 80-99%, L 0-39%  . HTN (hypertension) 11/27/2012  . Hyperlipidemia   . Limb pain 05/02/2011   doppler - no evidence of thrombus of thrombophlebitis  . Long term (current) use of anticoagulants   . Loss of weight   . Lumbago   . Myalgia and myositis, unspecified   . Myocardial infarction ([place])   . Nonspecific abnormal results of liver function study   .  Nontoxic uninodular goiter   . Osteoarthrosis, unspecified whether generalized or localized, unspecified site   . Other malaise and fatigue   . Other manifestations of vitamin A deficiency   . PAF (paroxysmal atrial fibrillation) ([place]) 02/21/2011  . Pain in joint, pelvic region and thigh   . Pain in limb   . Peripheral vascular disease ([place])   . Phlebitis and thrombophlebitis of superficial vessels of lower extremities   . Primary localized osteoarthrosis, hand   . Pulmonary HTN ([place]) 12/03/2012  . Renal artery stenosis ([place])   . Routine gynecological examination   . Spinal  stenosis, unspecified region other than cervical   . Stenosis of artery ([place]) 06/03/2012   doppler- most distal aspect of abd aorta no aneurysmal dilation; bilateral ABIs - mild L arterial insufficiency, L CIA narrowing w/ 50-69% diameter reduction; L and R SFA both w/ 0-49% diameter reductions  . Stroke ([place]) 2001  . Superficial vein thrombosis 05/11/2015   Left forehead  . Tobacco use disorder   . TR (tricuspid regurgitation), severe 12/02/12 for repair with CABG 12/03/2012  . Type II or unspecified type diabetes mellitus with peripheral circulatory disorders, not stated as uncontrolled(250.70)   . Unspecified cataract   . Unspecified disorder of iris and ciliary body   . Unspecified vitamin D deficiency   . Urinary tract infection, site not specified     Past Surgical History:  Procedure Laterality Date  . ABDOMINAL HYSTERECTOMY  1975  . ANGIOPLASTY  1984 and 1982  . APPENDECTOMY  1954  . ARCH AORTOGRAM  03/30/2011   40% recurrent stenosis at origin of R proximal carotid patch, shelf like recurrent stenosis w/in midsection of patch that does not appear to be flow limiting; normal non-stenosed great vessel origins  . BACK SURGERY  '84,'86, '87   three previous surgeries  . CAROTID ENDARTERECTOMY  2001   Bilateral  . CORONARY ARTERY BYPASS GRAFT  12/04/2012   Procedure: CORONARY ARTERY BYPASS GRAFTING (CABG);  Surgeon: Ivin Poot, MD; LIMA-LAD, SVG-OM, SVG-RCA  . INTRAOPERATIVE TRANSESOPHAGEAL ECHOCARDIOGRAM  12/04/2012   Procedure: INTRAOPERATIVE TRANSESOPHAGEAL ECHOCARDIOGRAM;  Surgeon: Ivin Poot, MD;  Location: [facility];  Service: Open Heart Surgery;  Laterality: N/A;  . KIDNEY STONE SURGERY     Removal  . LEFT HEART CATHETERIZATION WITH CORONARY ANGIOGRAM N/A 11/28/2012   Procedure: LEFT HEART CATHETERIZATION WITH CORONARY ANGIOGRAM;  Surgeon: Leonie Man, MD;  Location: [facility] CATH LAB;  Service: Cardiovascular;  Laterality: N/A;  . PERCUTANEOUS CORONARY STENT INTERVENTION  (PCI-S)  2002   2 BMS in ostial/Prox & mid RCA (mid 2.5 mm 20x  mm, & 2.75 mm x  8 mm ostial)  . PERIPHERAL VASCULAR INTERVENTION  07/21/2018   Procedure: PERIPHERAL VASCULAR INTERVENTION;  Surgeon: Lorretta Harp, MD;  Location: [facility] CV LAB;  Service: Cardiovascular;;  bilateral renal stents  . RENAL ANGIOGRAM N/A 11/30/2013   Procedure: RENAL ANGIOGRAM;  Surgeon: Lorretta Harp, MD;  Location: [facility] CATH LAB;  Service: Cardiovascular;  Laterality: N/A;  . RENAL ANGIOGRAPHY N/A 07/21/2018   Procedure: RENAL ANGIOGRAPHY;  Surgeon: Lorretta Harp, MD;  Location: [facility] CV LAB;  Service: Cardiovascular;  Laterality: N/A;  . RIGHT HEART CATHETERIZATION N/A 12/02/2012   Procedure: RIGHT HEART CATH;  Surgeon: Troy Sine, MD;  Location: [facility] CATH LAB;  Service: Cardiovascular;  Laterality: N/A;  . TEE WITH CARDIOVERSION  01/23/2011   EF 67-89%; grade 2 diastolic dysfunction, elevated mean LA filling pressure, RV systolic pressure  increased consistent w/ mod pulmonary hypertension     reports that she quit smoking about 5 years ago. Her smoking use included cigarettes. She has a 17.10 pack-year smoking history. She has never used smokeless tobacco. She reports that she drinks alcohol. She reports that she does not use drugs.  Allergies  Allergen Reactions  . Iron Hives  . Vicodin [Hydrocodone-Acetaminophen] Other (See Comments)    Caused arrythmia   . Amiodarone Nausea And Vomiting  . Norvasc [Amlodipine Besylate] Nausea And Vomiting  . Promethazine Other (See Comments)    hallucinations  . Vioxx [Rofecoxib] Nausea And Vomiting    Family History  Problem Relation Age of Onset  . Other Mother        CVA  . Diabetes Mother   . Heart disease Mother        Heart disease before age 35  . Heart attack Mother   . Heart disease Father        Heart Disease before age 42  . Heart attack Father   . Heart disease Sister        Amputation  . Diabetes Sister   . Heart disease  Brother        Heart disease before age 45  . Diabetes Brother   . Stroke Brother   . Stroke Brother   . Heart disease Brother   . Stroke Brother   . Heart disease Brother   . Heart disease Sister   . Heart disease Sister   . Diabetes Daughter   . Cancer Sister   . Diabetes Other   . Cancer Other        breast  . Heart disease Other        cad  . Other Other        cva  . Colon cancer Neg Hx   . Esophageal cancer Neg Hx   . Inflammatory bowel disease Neg Hx   . Liver disease Neg Hx   . Pancreatic cancer Neg Hx   . Rectal cancer Neg Hx   . Stomach cancer Neg Hx      Prior to Admission medications   Medication Sig Start Date End Date Taking? Authorizing Provider  alendronate (FOSAMAX) 70 MG tablet Take 70 mg by mouth every Friday.  07/13/16  Yes [provider]  aspirin 81 MG tablet Take 81 mg by mouth daily.    Yes [provider]  bimatoprost (LUMIGAN) 0.01 % SOLN Place 1 drop into both eyes at bedtime.   Yes [provider]  carvedilol (COREG) 25 MG tablet TAKE ONE-HALF (1/2) TABLET TWICE A DAY WITH MEALS (NEEDS OFFICE VISIT) Patient taking differently: Take 12.5 mg by mouth 2 (two) times daily.  04/21/18  Yes Croitoru, Mihai, MD  clopidogrel (PLAVIX) 75 MG tablet Take 1 tablet (75 mg total) by mouth daily with breakfast. 07/23/18  Yes Daune Perch, NP  dicyclomine (BENTYL) 10 MG capsule Take 3 times daily as needed for spasm. Patient taking differently: Take 10 mg by mouth 3 (three) times daily as needed for spasms.  08/05/18  Yes Mansouraty, Telford Nab., MD  ezetimibe (ZETIA) 10 MG tablet Take 10 mg by mouth at bedtime.    Yes [provider]  furosemide (LASIX) 20 MG tablet Take 20 mg by mouth daily as needed (for fluid retention.).  06/04/18  Yes [provider]  hydrALAZINE (APRESOLINE) 25 MG tablet Take 1 tablet (25 mg total) by mouth 2 (two) times daily. 08/06/18  Yes  [name], [place]  hydrochlorothiazide (HYDRODIURIL) 12.5 MG  tablet Take 1 tablet (12.5 mg total) by mouth daily. 08/06/18  Yes [name], PA  meloxicam (MOBIC) 7.5 MG tablet Take 7.5 mg by mouth daily.   Yes [provider]  metFORMIN (GLUCOPHAGE) 1000 MG tablet TAKE 1 TABLET TWICE A DAY WITH MEALS Patient taking differently: Take 1,000 mg by mouth 2 (two) times daily with a meal.  08/02/16  Yes Estill Dooms, MD  nitroGLYCERIN (NITROSTAT) 0.4 MG SL tablet Place 1 tablet (0.4 mg total) under the tongue every 5 (five) minutes as needed for chest pain. 01/31/18  Yes Croitoru, Mihai, MD  omeprazole (PRILOSEC) 40 MG capsule Take 1 capsule (40 mg total) by mouth 2 (two) times daily. 08/11/18  Yes Mansouraty, Telford Nab., MD  ondansetron (ZOFRAN ODT) 8 MG disintegrating tablet Take 1 tablet (8 mg total) by mouth every 8 (eight) hours as needed for nausea or vomiting. 08/22/18  Yes Mansouraty, Telford Nab., MD  rosuvastatin (CRESTOR) 40 MG tablet Take one tablet by mouth once daily for cholesterol Patient taking differently: Take 40 mg by mouth at bedtime.  10/23/17  Yes Reed, Tiffany L, DO  traMADol (ULTRAM) 50 MG tablet Take 1 tablet (50 mg total) by mouth every 8 (eight) hours as needed. Patient taking differently: Take 50 mg by mouth every 8 (eight) hours as needed (for pain.).  03/05/18  Yes Lauree Chandler, NP    Physical Exam: Vitals:   08/22/18 2308 08/22/18 2309  BP: (!) 115/49   Pulse: 79   Resp: (!) 22   Temp: 98.9 F (37.2 C)   TempSrc: Oral   SpO2: 100%   Weight:  63.5 kg  Height:  5\' 2"  (1.575 m)    Constitutional: NAD, calm, comfortable Eyes: PERRL, lids and conjunctivae normal ENMT: Mucous membranes are moist. Posterior pharynx clear of any exudate or lesions.Normal dentition.  Neck: normal, supple, no masses, no thyromegaly Respiratory: clear to auscultation bilaterally, no wheezing, no crackles. Normal respiratory effort. No accessory muscle use.  Cardiovascular: Regular rate and rhythm, no murmurs / rubs / gallops. No  extremity edema. 2+ pedal pulses. No carotid bruits.  Abdomen: LLQ TTP Musculoskeletal: no clubbing / cyanosis. No joint deformity upper and lower extremities. Good ROM, no contractures. Normal muscle tone.  Skin: no rashes, lesions, ulcers. No induration Neurologic: CN 2-12 grossly intact. Sensation intact, DTR normal. Strength 5/5 in all 4.  Psychiatric: Normal judgment and insight. Alert and oriented x 3. Normal mood.    Labs on Admission: I have personally reviewed following labs and imaging studies  CBC: Recent Labs  Lab 08/22/18 2319  WBC 10.0  NEUTROABS 7.4  HGB 9.3*  HCT 30.9*  MCV 77.6*  PLT 756   Basic Metabolic Panel: Recent Labs  Lab 08/22/18 2319  NA 131*  K 4.7  CL 98  CO2 21*  GLUCOSE 175*  BUN 22  CREATININE 2.29*  CALCIUM 8.7*   GFR: Estimated Creatinine Clearance: 17.5 mL/min (A) (by C-G formula based on SCr of 2.29 mg/dL (H)). Liver Function Tests: Recent Labs  Lab 08/22/18 2319  AST 50*  ALT 28  ALKPHOS 60  BILITOT 0.8  PROT 6.5  ALBUMIN 2.7*   Recent Labs  Lab 08/22/18 2319  LIPASE 35   No results for input(s): AMMONIA in the last 168 hours. Coagulation Profile: No results for input(s): INR, PROTIME in the last 168 hours. Cardiac Enzymes: No results for input(s): CKTOTAL, CKMB, CKMBINDEX, TROPONINI in the  last 168 hours. BNP (last 3 results) No results for input(s): PROBNP in the last 8760 hours. HbA1C: No results for input(s): HGBA1C in the last 72 hours. CBG: No results for input(s): GLUCAP in the last 168 hours. Lipid Profile: No results for input(s): CHOL, HDL, LDLCALC, TRIG, CHOLHDL, LDLDIRECT in the last 72 hours. Thyroid Function Tests: No results for input(s): TSH, T4TOTAL, FREET4, T3FREE, THYROIDAB in the last 72 hours. Anemia Panel: No results for input(s): VITAMINB12, FOLATE, FERRITIN, TIBC, IRON, RETICCTPCT in the last 72 hours. Urine analysis:    Component Value Date/Time   COLORURINE YELLOW 07/09/2014 0955    APPEARANCEUR CLOUDY (A) 07/09/2014 0955   LABSPEC 1.010 07/09/2014 0955   PHURINE 7.0 07/09/2014 0955   GLUCOSEU NEGATIVE 07/09/2014 0955   HGBUR NEGATIVE 07/09/2014 0955   BILIRUBINUR neg 07/18/2017 1118   KETONESUR NEGATIVE 07/09/2014 0955   PROTEINUR 100++ 07/18/2017 1118   PROTEINUR NEGATIVE 07/09/2014 0955   UROBILINOGEN negative (A) 07/18/2017 1118   UROBILINOGEN 1.0 07/09/2014 0955   NITRITE neg 07/18/2017 1118   NITRITE NEGATIVE 07/09/2014 0955   LEUKOCYTESUR Moderate (2+) (A) 07/18/2017 1118    Radiological Exams on Admission: No results found.  EKG: Independently reviewed.  Assessment/Plan Principal Problem:   Nausea & vomiting Active Problems:   CAD (coronary artery disease): 1993 PTCA to circumflex.  2002 2 BMS stents to ostial, S/P CABG x 3 12/04/12   Bilateral renal artery stenosis (HCC)   Renovascular hypertension   Labile blood pressure   LLQ abdominal pain   Orthostatic hypotension    1. N/V and LLQ abd pain - 1. Call [facility] in AM who have been following her for this over the past 19 days. 2. Clear liquid diet 3. zofran PRN nausea 4. Tramadol PRN pain 5. Trying to clear the AKI as below to enable [place] to get CT abd/pelvis with contrast, alternatively GI had been suggesting endoscopic work up back on 10/1 6. Not c/w acute ischemia, lactate level nl. 7. No fevers, WBC only 10.0, no other SIRS to suggest infection at this point though diverticulitis remains a possibility. 2. Mild AKI on CKD stage 3 - 1. Likely pre-renal due to N/V 2. IVF: 1L in ED then 100 cc/hr 3. Monitor UOP 4. Repeat BMP in AM 5. Holding diuretics 3. Orthostatic hypotension - likely due to N/V 1. Holding diuretics 2. IVF as above 4. S/P B renal artery stents - 1. Patent as of [place] 5 days ago 2. Continue ASA / Plavix 5. HTN - 1. Continue home BP meds except diuretics  DVT prophylaxis: Heparin [place] Code Status: Full Family Communication: No family in room Disposition Plan: Home  after admit Consults called: None, call GI in AM Admission status: Place in [place]. DO [facility] Pager [phone] Only works nights!  If 7AM-7PM, please contact the primary day team physician taking care of patient  [email or web address] Password [email or web address]  08/23/2018, 3:02 AM

## 2018-08-23 NOTE — Progress Notes (Signed)
PATIENT BLADDER SCAN SHOWED 381ML, MD IS AWARE. WAITING FOR AN ORDER TO IN AND OUT PATIENT.

## 2018-08-23 NOTE — Progress Notes (Signed)
PROGRESS NOTE  Kendra Todd CBU:[id number] DOB: 03/14/1941 DOA: 08/22/2018 PCP: [name], DO  HPI/Recap of past 24 hours: HPI from Dr [name] is a 77 y.o. female with medical history significant of RAS, CAD, HLD, PAF, DM2, HTN, Stroke, AAA, CKD presents to the ED with c/o abdominal pain, n/v, weakness/dizziness for the past few days. Pt reported having a stent to her renal artery about 3 weeks ago and since then, has been having LLQ abdominal pain, with recent non bloody vomiting and nausea. Pt saw Dr. [name] with [facility] on 10/1.  He did note that symptoms not typical mesenteric ischemia however that this was still considered a possibility.  Recd endoscopic evaluation though patient was advised by cardiology, not to disrupt her [facility] for the next 3 months due to her recently placed stents for RAS. GI also tried treating her for H. Pylori of which she couldn't tolerate the AB and stopped 10/18. Pt also was noted to have iron def anemia and was given IV iron infusion on 10/7. Due to significant dizziness, EMS was called, patient found to have severe orthostatic hypotension with BP dropping from 126/52 all the way to 68/32 when standing. In the ED, pt noted to be in AKI on CKD, given IVF and admitted for further management.   Today, pt reported feeling somewhat better, still with some nausea, no vomiting since this am. Tried CLD and was ok with it. Pt denies any chest pain, SOB, fever/chills, cough, diarrhea.   Assessment/Plan: Principal Problem:   Nausea & vomiting Active Problems:   CAD (coronary artery disease): 1993 PTCA to circumflex.  2002 2 BMS stents to ostial, S/P CABG x 3 12/04/12   Bilateral renal artery stenosis (HCC)   Renovascular hypertension   Labile blood pressure   LLQ abdominal pain   Orthostatic hypotension  LLQ abdominal pain Associated with nausea/vomiting Afebrile, no leukocytosis Unclear etiology LFTs, lipase, lactic acid all WNL May consider  CT abdomen pelvis without contrast GI consulted, appreciate recs Continue IV fluids Pain, antiemetic management Will advance to full liquid diet  Orthostatic hypotension/dizziness Likely due to nausea/vomiting, dehydration Orthostatic vitals every shift IV fluids  AKI on CKD stage III Baseline creatinine 1.5, on admission 2.29 Likely prerenal Continue IV fluids, avoid nephrotoxic's Strict I's and O's  Iron deficiency anemia Baseline hemoglobin around 9 Currently at baseline Daily CBC  Hyponatremia Likely due to dehydration Continue IV fluids Daily BMP  Hypertension Stable Continue Coreg, hydralazine Hold Lasix, hydrochlorothiazide  DM type II Last A1c 5.9 on 05/2018, controlled SSI, Accu-Cheks Hold home metformin  Renal artery stenosis s/p stent placement Recent ultrasound showed patent stents Continue aspirin, Plavix, statins        Code Status: Full  Family Communication: None at bedside  Disposition Plan: 08/24/2018   Consultants:  GI  Procedures:  None  Antimicrobials:  None  DVT prophylaxis: Heparin   Objective: Vitals:   08/23/18 0435 08/23/18 0804 08/23/18 0842 08/23/18 1149  BP: (!) 162/63 (!) 147/74 133/60 135/63  Pulse: 77 65  62  Resp: 16 16  18   Temp: 97.6 F (36.4 C) 98.4 F (36.9 C)  98 F (36.7 C)  TempSrc: Oral Oral  Oral  SpO2: 100% 100%  100%  Weight: 63.2 kg     Height:        Intake/Output Summary (Last 24 hours) at 08/23/2018 1217 Last data filed at 08/23/2018 1125 Gross per 24 hour  Intake 1352.64 ml  Output -  Net 1352.64 ml   Filed Weights   08/22/18 2309 08/23/18 0435  Weight: 63.5 kg 63.2 kg    Exam:   General: NAD  Cardiovascular: S1, S2 present  Respiratory: CTA B  Abdomen: Soft, TTP on LLQ, nondistended, bowel sounds present  Musculoskeletal: No pedal edema bilaterally  Skin: Normal  Psychiatry: Normal mood   Data Reviewed: CBC: Recent Labs  Lab 08/22/18 2319  WBC 10.0    NEUTROABS 7.4  HGB 9.3*  HCT 30.9*  MCV 77.6*  PLT 001   Basic Metabolic Panel: Recent Labs  Lab 08/22/18 2319 08/23/18 0608  NA 131* 132*  K 4.7 4.0  CL 98 102  CO2 21* 22  GLUCOSE 175* 130*  BUN 22 22  CREATININE 2.29* 2.03*  CALCIUM 8.7* 8.4*   GFR: Estimated Creatinine Clearance: 19.6 mL/min (A) (by C-G formula based on SCr of 2.03 mg/dL (H)). Liver Function Tests: Recent Labs  Lab 08/22/18 2319  AST 50*  ALT 28  ALKPHOS 60  BILITOT 0.8  PROT 6.5  ALBUMIN 2.7*   Recent Labs  Lab 08/22/18 2319  LIPASE 35   No results for input(s): AMMONIA in the last 168 hours. Coagulation Profile: No results for input(s): INR, PROTIME in the last 168 hours. Cardiac Enzymes: No results for input(s): CKTOTAL, CKMB, CKMBINDEX, TROPONINI in the last 168 hours. BNP (last 3 results) No results for input(s): PROBNP in the last 8760 hours. HbA1C: No results for input(s): HGBA1C in the last 72 hours. CBG: Recent Labs  Lab 08/23/18 0355 08/23/18 0800 08/23/18 1147  GLUCAP 151* 97 100*   Lipid Profile: No results for input(s): CHOL, HDL, LDLCALC, TRIG, CHOLHDL, LDLDIRECT in the last 72 hours. Thyroid Function Tests: No results for input(s): TSH, T4TOTAL, FREET4, T3FREE, THYROIDAB in the last 72 hours. Anemia Panel: No results for input(s): VITAMINB12, FOLATE, FERRITIN, TIBC, IRON, RETICCTPCT in the last 72 hours. Urine analysis:    Component Value Date/Time   COLORURINE YELLOW 07/09/2014 0955   APPEARANCEUR CLOUDY (A) 07/09/2014 0955   LABSPEC 1.010 07/09/2014 0955   PHURINE 7.0 07/09/2014 0955   GLUCOSEU NEGATIVE 07/09/2014 0955   HGBUR NEGATIVE 07/09/2014 0955   BILIRUBINUR neg 07/18/2017 1118   KETONESUR NEGATIVE 07/09/2014 0955   PROTEINUR 100++ 07/18/2017 1118   PROTEINUR NEGATIVE 07/09/2014 0955   UROBILINOGEN negative (A) 07/18/2017 1118   UROBILINOGEN 1.0 07/09/2014 0955   NITRITE neg 07/18/2017 1118   NITRITE NEGATIVE 07/09/2014 0955   LEUKOCYTESUR  Moderate (2+) (A) 07/18/2017 1118   Sepsis Labs: @LABRCNTIP (procalcitonin:4,lacticidven:4)  )No results found for this or any previous visit (from the past 240 hour(s)).    Studies: No results found.  Scheduled Meds: . aspirin  81 mg Oral Daily  . carvedilol  12.5 mg Oral BID  . clopidogrel  75 mg Oral Q breakfast  . ezetimibe  10 mg Oral QHS  . feeding supplement  1 Container Oral TID BM  . heparin  5,000 Units Subcutaneous Q8H  . hydrALAZINE  25 mg Oral BID  . insulin aspart  0-9 Units Subcutaneous Q4H  . latanoprost  1 drop Both Eyes QHS  . pantoprazole  80 mg Oral BID  . rosuvastatin  40 mg Oral QHS    Continuous Infusions: . sodium chloride 100 mL/hr at 08/23/18 0603     LOS: 0 days     Alma Friendly, MD [facility]   If 7PM-7AM, please contact night-coverage [email or web address]  08/23/2018, 12:17 PM

## 2018-08-24 DIAGNOSIS — R109 Unspecified abdominal pain: Secondary | ICD-10-CM | POA: Diagnosis not present

## 2018-08-24 DIAGNOSIS — I252 Old myocardial infarction: Secondary | ICD-10-CM | POA: Diagnosis not present

## 2018-08-24 DIAGNOSIS — R112 Nausea with vomiting, unspecified: Secondary | ICD-10-CM | POA: Diagnosis not present

## 2018-08-24 DIAGNOSIS — K229 Disease of esophagus, unspecified: Secondary | ICD-10-CM | POA: Diagnosis not present

## 2018-08-24 DIAGNOSIS — I951 Orthostatic hypotension: Secondary | ICD-10-CM | POA: Diagnosis present

## 2018-08-24 DIAGNOSIS — I5032 Chronic diastolic (congestive) heart failure: Secondary | ICD-10-CM | POA: Diagnosis present

## 2018-08-24 DIAGNOSIS — R6881 Early satiety: Secondary | ICD-10-CM | POA: Diagnosis not present

## 2018-08-24 DIAGNOSIS — I10 Essential (primary) hypertension: Secondary | ICD-10-CM | POA: Diagnosis not present

## 2018-08-24 DIAGNOSIS — Z87891 Personal history of nicotine dependence: Secondary | ICD-10-CM | POA: Diagnosis not present

## 2018-08-24 DIAGNOSIS — I150 Renovascular hypertension: Secondary | ICD-10-CM | POA: Diagnosis not present

## 2018-08-24 DIAGNOSIS — R634 Abnormal weight loss: Secondary | ICD-10-CM | POA: Diagnosis not present

## 2018-08-24 DIAGNOSIS — I130 Hypertensive heart and chronic kidney disease with heart failure and stage 1 through stage 4 chronic kidney disease, or unspecified chronic kidney disease: Secondary | ICD-10-CM | POA: Diagnosis present

## 2018-08-24 DIAGNOSIS — A048 Other specified bacterial intestinal infections: Secondary | ICD-10-CM | POA: Diagnosis not present

## 2018-08-24 DIAGNOSIS — E1122 Type 2 diabetes mellitus with diabetic chronic kidney disease: Secondary | ICD-10-CM | POA: Diagnosis present

## 2018-08-24 DIAGNOSIS — I480 Paroxysmal atrial fibrillation: Secondary | ICD-10-CM | POA: Diagnosis present

## 2018-08-24 DIAGNOSIS — N179 Acute kidney failure, unspecified: Secondary | ICD-10-CM | POA: Diagnosis present

## 2018-08-24 DIAGNOSIS — R1084 Generalized abdominal pain: Secondary | ICD-10-CM | POA: Diagnosis not present

## 2018-08-24 DIAGNOSIS — B9681 Helicobacter pylori [H. pylori] as the cause of diseases classified elsewhere: Secondary | ICD-10-CM | POA: Diagnosis present

## 2018-08-24 DIAGNOSIS — R1013 Epigastric pain: Secondary | ICD-10-CM | POA: Diagnosis not present

## 2018-08-24 DIAGNOSIS — Z7901 Long term (current) use of anticoagulants: Secondary | ICD-10-CM | POA: Diagnosis not present

## 2018-08-24 DIAGNOSIS — B3781 Candidal esophagitis: Secondary | ICD-10-CM | POA: Diagnosis present

## 2018-08-24 DIAGNOSIS — K5900 Constipation, unspecified: Secondary | ICD-10-CM | POA: Diagnosis present

## 2018-08-24 DIAGNOSIS — K208 Other esophagitis: Secondary | ICD-10-CM | POA: Diagnosis not present

## 2018-08-24 DIAGNOSIS — E881 Lipodystrophy, not elsewhere classified: Secondary | ICD-10-CM | POA: Diagnosis present

## 2018-08-24 DIAGNOSIS — E871 Hypo-osmolality and hyponatremia: Secondary | ICD-10-CM | POA: Diagnosis present

## 2018-08-24 DIAGNOSIS — N183 Chronic kidney disease, stage 3 (moderate): Secondary | ICD-10-CM | POA: Diagnosis present

## 2018-08-24 DIAGNOSIS — R0602 Shortness of breath: Secondary | ICD-10-CM | POA: Diagnosis not present

## 2018-08-24 DIAGNOSIS — R1030 Lower abdominal pain, unspecified: Secondary | ICD-10-CM | POA: Diagnosis not present

## 2018-08-24 DIAGNOSIS — I716 Thoracoabdominal aortic aneurysm, without rupture: Secondary | ICD-10-CM | POA: Diagnosis present

## 2018-08-24 DIAGNOSIS — R1032 Left lower quadrant pain: Secondary | ICD-10-CM | POA: Diagnosis not present

## 2018-08-24 DIAGNOSIS — K551 Chronic vascular disorders of intestine: Secondary | ICD-10-CM | POA: Diagnosis present

## 2018-08-24 DIAGNOSIS — I701 Atherosclerosis of renal artery: Secondary | ICD-10-CM | POA: Diagnosis present

## 2018-08-24 DIAGNOSIS — K760 Fatty (change of) liver, not elsewhere classified: Secondary | ICD-10-CM | POA: Diagnosis not present

## 2018-08-24 DIAGNOSIS — I739 Peripheral vascular disease, unspecified: Secondary | ICD-10-CM | POA: Diagnosis not present

## 2018-08-24 DIAGNOSIS — I2510 Atherosclerotic heart disease of native coronary artery without angina pectoris: Secondary | ICD-10-CM | POA: Diagnosis present

## 2018-08-24 DIAGNOSIS — D649 Anemia, unspecified: Secondary | ICD-10-CM | POA: Diagnosis not present

## 2018-08-24 DIAGNOSIS — R0989 Other specified symptoms and signs involving the circulatory and respiratory systems: Secondary | ICD-10-CM | POA: Diagnosis not present

## 2018-08-24 DIAGNOSIS — Z7902 Long term (current) use of antithrombotics/antiplatelets: Secondary | ICD-10-CM | POA: Diagnosis not present

## 2018-08-24 DIAGNOSIS — M2450 Contracture, unspecified joint: Secondary | ICD-10-CM | POA: Diagnosis present

## 2018-08-24 DIAGNOSIS — D509 Iron deficiency anemia, unspecified: Secondary | ICD-10-CM | POA: Diagnosis present

## 2018-08-24 DIAGNOSIS — E785 Hyperlipidemia, unspecified: Secondary | ICD-10-CM | POA: Diagnosis present

## 2018-08-24 DIAGNOSIS — I2721 Secondary pulmonary arterial hypertension: Secondary | ICD-10-CM | POA: Diagnosis present

## 2018-08-24 DIAGNOSIS — B379 Candidiasis, unspecified: Secondary | ICD-10-CM | POA: Diagnosis not present

## 2018-08-24 LAB — CBC WITH DIFFERENTIAL/PLATELET
Abs Immature Granulocytes: 0.02 10*3/uL (ref 0.00–0.07)
Basophils Absolute: 0 10*3/uL (ref 0.0–0.1)
Basophils Relative: 1 %
EOS ABS: 0.2 10*3/uL (ref 0.0–0.5)
Eosinophils Relative: 4 %
HEMATOCRIT: 26 % — AB (ref 36.0–46.0)
Hemoglobin: 7.8 g/dL — ABNORMAL LOW (ref 12.0–15.0)
Immature Granulocytes: 0 %
LYMPHS ABS: 0.9 10*3/uL (ref 0.7–4.0)
Lymphocytes Relative: 17 %
MCH: 23.3 pg — AB (ref 26.0–34.0)
MCHC: 30 g/dL (ref 30.0–36.0)
MCV: 77.6 fL — ABNORMAL LOW (ref 80.0–100.0)
Monocytes Absolute: 0.7 10*3/uL (ref 0.1–1.0)
Monocytes Relative: 13 %
NRBC: 0 % (ref 0.0–0.2)
Neutro Abs: 3.4 10*3/uL (ref 1.7–7.7)
Neutrophils Relative %: 65 %
Platelets: 202 10*3/uL (ref 150–400)
RBC: 3.35 MIL/uL — ABNORMAL LOW (ref 3.87–5.11)
RDW: 17.2 % — AB (ref 11.5–15.5)
WBC: 5.1 10*3/uL (ref 4.0–10.5)

## 2018-08-24 LAB — GLUCOSE, CAPILLARY
GLUCOSE-CAPILLARY: 87 mg/dL (ref 70–99)
Glucose-Capillary: 104 mg/dL — ABNORMAL HIGH (ref 70–99)
Glucose-Capillary: 153 mg/dL — ABNORMAL HIGH (ref 70–99)
Glucose-Capillary: 159 mg/dL — ABNORMAL HIGH (ref 70–99)
Glucose-Capillary: 80 mg/dL (ref 70–99)
Glucose-Capillary: 89 mg/dL (ref 70–99)

## 2018-08-24 LAB — BASIC METABOLIC PANEL
Anion gap: 7 (ref 5–15)
BUN: 16 mg/dL (ref 8–23)
CALCIUM: 7.7 mg/dL — AB (ref 8.9–10.3)
CO2: 21 mmol/L — AB (ref 22–32)
CREATININE: 1.65 mg/dL — AB (ref 0.44–1.00)
Chloride: 104 mmol/L (ref 98–111)
GFR calc Af Amer: 33 mL/min — ABNORMAL LOW (ref >60)
GFR calc non Af Amer: 28 mL/min — ABNORMAL LOW (ref >60)
GLUCOSE: 102 mg/dL — AB (ref 70–99)
Potassium: 3.8 mmol/L (ref 3.5–5.1)
Sodium: 132 mmol/L — ABNORMAL LOW (ref 135–145)

## 2018-08-24 MED ORDER — POLYETHYLENE GLYCOL 3350 17 G PO PACK
17.0000 g | PACK | Freq: Two times a day (BID) | ORAL | Status: AC
  Administered 2018-08-24: 17 g via ORAL
  Filled 2018-08-24 (×2): qty 1

## 2018-08-24 MED ORDER — PANTOPRAZOLE SODIUM 40 MG PO TBEC
40.0000 mg | DELAYED_RELEASE_TABLET | Freq: Two times a day (BID) | ORAL | Status: DC
  Administered 2018-08-24 – 2018-08-30 (×12): 40 mg via ORAL
  Filled 2018-08-24 (×12): qty 1

## 2018-08-24 NOTE — Progress Notes (Signed)
PROGRESS NOTE  Kendra Todd KGU:[id number] DOB: [birth year] DOA: 08/22/2018 PCP: Kendra Curry, DO  HPI/Recap of past 24 hours: HPI from Dr [name] is a 77 y.o. female with medical history significant of RAS, CAD, HLD, PAF, DM2, HTN, Stroke, AAA, CKD presents to the ED with c/o abdominal pain, n/v, weakness/dizziness for the past few days. Pt reported having a stent to her renal artery about 3 weeks ago and since then, has been having LLQ abdominal pain, with recent non bloody vomiting and nausea. Pt saw Dr. [name] with [facility] on 10/1.  He did note that symptoms not typical mesenteric ischemia however that this was still considered a possibility.  Recd endoscopic evaluation though patient was advised by cardiology, not to disrupt her [facility] for the next 3 months due to her recently placed stents for RAS. GI also tried treating her for H. Pylori of which she couldn't tolerate the AB and stopped 10/18. Pt also was noted to have iron def anemia and was given IV iron infusion on 10/7. Due to significant dizziness, EMS was called, patient found to have severe orthostatic hypotension with BP dropping from 126/52 all the way to 68/32 when standing. In the ED, pt noted to be in AKI on CKD, given IVF and admitted for further management.   Today, pt reported feeling somewhat better, c/o of some bloating and LLQ abdominal pain. Reports no nausea/vomiting, denies any other symptoms.  Assessment/Plan: Principal Problem:   Nausea & vomiting Active Problems:   CAD (coronary artery disease): 1993 PTCA to circumflex.  2002 2 BMS stents to ostial, S/P CABG x 3 12/04/12   Bilateral renal artery stenosis (HCC)   Renovascular hypertension   Labile blood pressure   LLQ abdominal pain   Orthostatic hypotension   H. pylori infection  LLQ abdominal pain/H.pylori infection Associated with nausea/vomiting, constipation Afebrile, no leukocytosis Unclear etiology LFTs, lipase, lactic acid all  WNL Unable to complete total treatment 2/2 N/V May consider CTA abdomen pelvis, with improving AKI GI consulted, appreciate recs Continue IV fluids Pain, antiemetic management, stool softners Will advance to full liquid diet  Orthostatic hypotension/dizziness Improving Likely due to nausea/vomiting, dehydration Orthostatic vitals every shift IV fluids  AKI on CKD stage III Improving Baseline creatinine 1.5, on admission 2.29 Likely prerenal Continue IV fluids, avoid nephrotoxic's Strict I's and O's  Iron deficiency/microcytic anemia Baseline hemoglobin around 9, currently 7.8, ?dilutional Daily CBC  Hyponatremia Likely due to dehydration Continue IV fluids Daily BMP  Hypertension Stable Continue Coreg, hydralazine Hold Lasix, hydrochlorothiazide  DM type II Last A1c 5.9 on 05/2018, controlled SSI, Accu-Cheks Hold home metformin  Renal artery stenosis s/p stent placement Recent ultrasound showed patent stents Continue aspirin, Plavix, statins      Code Status: Full  Family Communication: None at bedside  Disposition Plan: TBD once w/u complete   Consultants:  GI  Procedures:  None  Antimicrobials:  None  DVT prophylaxis: Hold Heparin due to drop in hgb   Objective: Vitals:   08/24/18 0008 08/24/18 0402 08/24/18 0455 08/24/18 0924  BP: 102/67  (!) 113/47 (!) 144/75  Pulse: (!) 52  63   Resp: 18  18   Temp: 98 F (36.7 C)  (!) 97.5 F (36.4 C)   TempSrc: Oral  Oral   SpO2:   98%   Weight:  65.8 kg    Height:        Intake/Output Summary (Last 24 hours) at 08/24/2018 1431 Last data  filed at 08/24/2018 1400 Gross per 24 hour  Intake 240 ml  Output 1350 ml  Net -1110 ml   Filed Weights   08/22/18 2309 08/23/18 0435 08/24/18 0402  Weight: 63.5 kg 63.2 kg 65.8 kg    Exam:   General: NAD  Cardiovascular: S1, S2 present  Respiratory: CTAB  Abdomen: Soft, TTP on LLQ, ND, BS present  Musculoskeletal: No pedal edema  present  Skin: Normal  Psychiatry: Normal mood   Data Reviewed: CBC: Recent Labs  Lab 08/22/18 2319 08/24/18 0531  WBC 10.0 5.1  NEUTROABS 7.4 3.4  HGB 9.3* 7.8*  HCT 30.9* 26.0*  MCV 77.6* 77.6*  PLT 263 938   Basic Metabolic Panel: Recent Labs  Lab 08/22/18 2319 08/23/18 0608 08/24/18 0531  NA 131* 132* 132*  K 4.7 4.0 3.8  CL 98 102 104  CO2 21* 22 21*  GLUCOSE 175* 130* 102*  BUN 22 22 16   CREATININE 2.29* 2.03* 1.65*  CALCIUM 8.7* 8.4* 7.7*   GFR: Estimated Creatinine Clearance: 24.6 mL/min (A) (by C-G formula based on SCr of 1.65 mg/dL (H)). Liver Function Tests: Recent Labs  Lab 08/22/18 2319  AST 50*  ALT 28  ALKPHOS 60  BILITOT 0.8  PROT 6.5  ALBUMIN 2.7*   Recent Labs  Lab 08/22/18 2319  LIPASE 35   No results for input(s): AMMONIA in the last 168 hours. Coagulation Profile: No results for input(s): INR, PROTIME in the last 168 hours. Cardiac Enzymes: No results for input(s): CKTOTAL, CKMB, CKMBINDEX, TROPONINI in the last 168 hours. BNP (last 3 results) No results for input(s): PROBNP in the last 8760 hours. HbA1C: No results for input(s): HGBA1C in the last 72 hours. CBG: Recent Labs  Lab 08/23/18 1949 08/24/18 0003 08/24/18 0359 08/24/18 0803 08/24/18 1200  GLUCAP 186* 159* 89 87 104*   Lipid Profile: No results for input(s): CHOL, HDL, LDLCALC, TRIG, CHOLHDL, LDLDIRECT in the last 72 hours. Thyroid Function Tests: No results for input(s): TSH, T4TOTAL, FREET4, T3FREE, THYROIDAB in the last 72 hours. Anemia Panel: No results for input(s): VITAMINB12, FOLATE, FERRITIN, TIBC, IRON, RETICCTPCT in the last 72 hours. Urine analysis:    Component Value Date/Time   COLORURINE YELLOW 08/23/2018 1920   APPEARANCEUR CLEAR 08/23/2018 1920   LABSPEC 1.014 08/23/2018 1920   PHURINE 7.0 08/23/2018 1920   GLUCOSEU 50 (A) 08/23/2018 1920   HGBUR NEGATIVE 08/23/2018 1920   BILIRUBINUR NEGATIVE 08/23/2018 1920   BILIRUBINUR neg  07/18/2017 1118   KETONESUR NEGATIVE 08/23/2018 1920   PROTEINUR 100 (A) 08/23/2018 1920   UROBILINOGEN negative (A) 07/18/2017 1118   UROBILINOGEN 1.0 07/09/2014 0955   NITRITE NEGATIVE 08/23/2018 1920   LEUKOCYTESUR NEGATIVE 08/23/2018 1920   Sepsis Labs: @LABRCNTIP (procalcitonin:4,lacticidven:4)  )No results found for this or any previous visit (from the past 240 hour(s)).    Studies: No results found.  Scheduled Meds: . aspirin  81 mg Oral Daily  . carvedilol  12.5 mg Oral BID  . clopidogrel  75 mg Oral Q breakfast  . ezetimibe  10 mg Oral QHS  . feeding supplement  1 Container Oral TID BM  . heparin  5,000 Units Subcutaneous Q8H  . hydrALAZINE  25 mg Oral BID  . insulin aspart  0-9 Units Subcutaneous Q4H  . latanoprost  1 drop Both Eyes QHS  . pantoprazole  40 mg Oral BID  . polyethylene glycol  17 g Oral BID  . rosuvastatin  40 mg Oral QHS    Continuous Infusions: .  sodium chloride 100 mL/hr at 08/24/18 1158     LOS: 0 days     Alma Friendly, MD [facility]   If 7PM-7AM, please contact night-coverage [email or web address]  08/24/2018, 2:31 PM

## 2018-08-24 NOTE — Consult Note (Signed)
Consultation  Referring Provider:  Dr. Horris Latino    Primary Care Physician:  [name], DO Primary Gastroenterologist:  Dr. [name]       Reason for Consultation: Left lower quadrant abdominal pain, nausea and vomiting         HPI:   Kendra Todd is a 77 y.o. female with a past medical history as listed below including past medical history significant for CAD, carotid artery disease, hypertension, hyperlipidemia, RIS (status post recent stenting 3 weeks ago), PVD, A. fib, AAA with mural thrombus and diabetes, who presented to the ER 08/23/2018 with a complaint of continued left lower quadrant pain as well as recent nausea and vomiting as well as constipation x9 days.    08/05/2018 patient seen in office by Dr. [name].  Please see that detailed office visit note.  At that time, she had a history of iron deficiency anemia which was discussed as well as left lower quadrant abdominal pain.  At that time, an EGD and colonoscopy were discussed, information was received from patient's physician in regards to holding Plavix and he advised not to hold for another 3 months as she had recent stenting.  Patient had H. pylori antigen testing of her stool which was positive.  Patient was also arranged for Feraheme infusion as she did not tolerate oral iron.    08/11/2018 patient was started on Clarithromycin 500 mg twice daily and Amoxicillin 500 mg twice daily x14 days with positive H. pylori stool antigen.  That day she also had a Feraheme infusion.     Today, the patient explains that the day that she received her Feraheme infusion she started antibiotics for a positive H. pylori infection and that day became severely nauseous.  As well as this, patient became constipated, "which normally happens with me and antibiotics".  This continued for 9 days and the patient was unable to tolerate even water by the time she proceeded to the hospital.  Since being in the hospital the patient did have some  improvement of nausea overnight but this returned after taking pills this morning.  She has had no episodes of vomiting.  Does describe she had occasional episodes of what sounds like dysphagia previously but no nausea or vomiting.  States symptoms include a weight loss of 10 pounds over the past couple of weeks.  Patient does tell me she had a small bowel movement yesterday morning and again in the ER but the second one was loose    Also continues with left lower quadrant pain as outlined by Dr. [name] office visit.  This continues to be intermittent and did start after stent placement.    Describes recent renal ultrasound which did show continued blockage of "one of my arteries".  No previous colonoscopy.    Denies fever, chills, heartburn or reflux.     Past Medical History:  Diagnosis Date  . Abdominal pain, epigastric   . Acute upper respiratory infections of unspecified site   . Arthritis   . Benign paroxysmal positional vertigo   . CAD (coronary artery disease)    prior coronary stenting  . Candidiasis of vulva and vagina   . Carotid artery disease ([place]) 2001   s/p Bilateral CEA, after CVA  . Cervicalgia   . Decreased pedal pulses 06/04/2011   doppler - bilateral ABIs no evidence of insufficiency; L CIA slightly elevated velocities 20-30% diameter reduction;   . Dyspnea 03/13/2006   Myoview - EF 76%; mild ischemia  in apical lateral region, less severe than previous study in 2002  . Edema   . H/O carotid endarterectomy 03/06/2012   CT angio -high grade stenosis of proximal R internal carotid w/ lg posterior ulceration or dissection flap; near occlusive stenosis at origin of nondominantproximal L vertebral artery; <50% stenosis of proximal R vertebral and proximal L subclavian artery  . H/O endarterectomy 09/11/2012   patent L and R carotid sites, R 60-79% restenosed ICA; L <40% restenosed ICA  . H/O endarterectomy 01/08/2012   doppler - R distal common carotid/proximal ICA stenosed  80-99%, L 0-39%  . HTN (hypertension) 11/27/2012  . Hyperlipidemia   . Limb pain 05/02/2011   doppler - no evidence of thrombus of thrombophlebitis  . Long term (current) use of anticoagulants   . Loss of weight   . Lumbago   . Myalgia and myositis, unspecified   . Myocardial infarction ([place])   . Nonspecific abnormal results of liver function study   . Nontoxic uninodular goiter   . Osteoarthrosis, unspecified whether generalized or localized, unspecified site   . Other malaise and fatigue   . Other manifestations of vitamin A deficiency   . PAF (paroxysmal atrial fibrillation) ([place]) 02/21/2011  . Pain in joint, pelvic region and thigh   . Pain in limb   . Peripheral vascular disease ([place])   . Phlebitis and thrombophlebitis of superficial vessels of lower extremities   . Primary localized osteoarthrosis, hand   . Pulmonary HTN ([place]) 12/03/2012  . Renal artery stenosis ([place])   . Routine gynecological examination   . Spinal stenosis, unspecified region other than cervical   . Stenosis of artery ([place]) 06/03/2012   doppler- most distal aspect of abd aorta no aneurysmal dilation; bilateral ABIs - mild L arterial insufficiency, L CIA narrowing w/ 50-69% diameter reduction; L and R SFA both w/ 0-49% diameter reductions  . Stroke ([place]) 2001  . Superficial vein thrombosis 05/11/2015   Left forehead  . Tobacco use disorder   . TR (tricuspid regurgitation), severe 12/02/12 for repair with CABG 12/03/2012  . Type II or unspecified type diabetes mellitus with peripheral circulatory disorders, not stated as uncontrolled(250.70)   . Unspecified cataract   . Unspecified disorder of iris and ciliary body   . Unspecified vitamin D deficiency   . Urinary tract infection, site not specified     Past Surgical History:  Procedure Laterality Date  . ABDOMINAL HYSTERECTOMY  1975  . ANGIOPLASTY  1984 and 1982  . APPENDECTOMY  1954  . ARCH AORTOGRAM  03/30/2011   40% recurrent stenosis at origin of R  proximal carotid patch, shelf like recurrent stenosis w/in midsection of patch that does not appear to be flow limiting; normal non-stenosed great vessel origins  . BACK SURGERY  '84,'86, '87   three previous surgeries  . CAROTID ENDARTERECTOMY  2001   Bilateral  . CORONARY ARTERY BYPASS GRAFT  12/04/2012   Procedure: CORONARY ARTERY BYPASS GRAFTING (CABG);  Surgeon: Ivin Poot, MD; LIMA-LAD, SVG-OM, SVG-RCA  . INTRAOPERATIVE TRANSESOPHAGEAL ECHOCARDIOGRAM  12/04/2012   Procedure: INTRAOPERATIVE TRANSESOPHAGEAL ECHOCARDIOGRAM;  Surgeon: Ivin Poot, MD;  Location: [facility];  Service: Open Heart Surgery;  Laterality: N/A;  . KIDNEY STONE SURGERY     Removal  . LEFT HEART CATHETERIZATION WITH CORONARY ANGIOGRAM N/A 11/28/2012   Procedure: LEFT HEART CATHETERIZATION WITH CORONARY ANGIOGRAM;  Surgeon: Leonie Man, MD;  Location: [facility] CATH LAB;  Service: Cardiovascular;  Laterality: N/A;  . PERCUTANEOUS CORONARY STENT INTERVENTION (  PCI-S)  2002   2 BMS in ostial/Prox & mid RCA (mid 2.5 mm 20x  mm, & 2.75 mm x  8 mm ostial)  . PERIPHERAL VASCULAR INTERVENTION  07/21/2018   Procedure: PERIPHERAL VASCULAR INTERVENTION;  Surgeon: Lorretta Harp, MD;  Location: [place] CV LAB;  Service: Cardiovascular;;  bilateral renal stents  . RENAL ANGIOGRAM N/A 11/30/2013   Procedure: RENAL ANGIOGRAM;  Surgeon: Lorretta Harp, MD;  Location: [facility] CATH LAB;  Service: Cardiovascular;  Laterality: N/A;  . RENAL ANGIOGRAPHY N/A 07/21/2018   Procedure: RENAL ANGIOGRAPHY;  Surgeon: Lorretta Harp, MD;  Location: [place] CV LAB;  Service: Cardiovascular;  Laterality: N/A;  . RIGHT HEART CATHETERIZATION N/A 12/02/2012   Procedure: RIGHT HEART CATH;  Surgeon: Troy Sine, MD;  Location[facility] C[facility] LAB;  Service: Cardiovascular;  Laterality: N/A;  . TEE WITH CARDIOVERSION  01/23/2011   EF 16-10%; grade 2 diastolic dysfunction, elevated mean LA filling pressure, RV systolic pressure increased consistent w/ mod  pulmonary hypertension    Family History  Problem Relation Age of Onset  . Other Mother        CVA  . Diabetes Mother   . Heart disease Mother        Heart disease before age 56  . Heart attack Mother   . Heart disease Father        Heart Disease before age 76  . Heart attack Father   . Heart disease Sister        Amputation  . Diabetes Sister   . Heart disease Brother        Heart disease before age 77  . Diabetes Brother   . Stroke Brother   . Stroke Brother   . Heart disease Brother   . Stroke Brother   . Heart disease Brother   . Heart disease Sister   . Heart disease Sister   . Diabetes Daughter   . Cancer Sister   . Diabetes Other   . Cancer Other        breast  . Heart disease Other        cad  . Other Other        cva  . Colon cancer Neg Hx   . Esophageal cancer Neg Hx   . Inflammatory bowel disease Neg Hx   . Liver disease Neg Hx   . Pancreatic cancer Neg Hx   . Rectal cancer Neg Hx   . Stomach cancer Neg Hx     Social History   Tobacco Use  . Smoking status: Former Smoker    Packs/day: 0.30    Years: 57.00    Pack years: 17.10    Types: Cigarettes    Last attempt to quit: 11/19/2012    Years since quitting: 5.7  . Smokeless tobacco: Never Used  Substance Use Topics  . Alcohol use: Yes    Comment: rarely, 1 a month  . Drug use: No    Prior to Admission medications   Medication Sig Start Date End Date Taking? Authorizing Provider  alendronate (FOSAMAX) 70 MG tablet Take 70 mg by mouth every Friday.  07/13/16  Yes [provider]  aspirin 81 MG tablet Take 81 mg by mouth daily.    Yes [provider]  bimatoprost (LUMIGAN) 0.01 % SOLN Place 1 drop into both eyes at bedtime.   Yes [provider]  carvedilol (COREG) 25 MG tablet TAKE ONE-HALF (1/2) TABLET TWICE A DAY WITH MEALS (  NEEDS OFFICE VISIT) Patient taking differently: Take 12.5 mg by mouth 2 (two) times daily.  04/21/18  Yes Croitoru, Mihai, MD  clopidogrel  (PLAVIX) 75 MG tablet Take 1 tablet (75 mg total) by mouth daily with breakfast. 07/23/18  Yes Daune Perch, NP  dicyclomine (BENTYL) 10 MG capsule Take 3 times daily as needed for spasm. Patient taking differently: Take 10 mg by mouth 3 (three) times daily as needed for spasms.  08/05/18  Yes Mansouraty, Telford Nab., MD  ezetimibe (ZETIA) 10 MG tablet Take 10 mg by mouth at bedtime.    Yes [provider]  furosemide (LASIX) 20 MG tablet Take 20 mg by mouth daily as needed (for fluid retention.).  06/04/18  Yes [provider]  hydrALAZINE (APRESOLINE) 25 MG tablet Take 1 tablet (25 mg total) by mouth 2 (two) times daily. 08/06/18  Yes [name], PA  hydrochlorothiazide (HYDRODIURIL) 12.5 MG tablet Take 1 tablet (12.5 mg total) by mouth daily. 08/06/18  Yes [name], PA  meloxicam (MOBIC) 7.5 MG tablet Take 7.5 mg by mouth daily.   Yes [provider]  metFORMIN (GLUCOPHAGE) 1000 MG tablet TAKE 1 TABLET TWICE A DAY WITH MEALS Patient taking differently: Take 1,000 mg by mouth 2 (two) times daily with a meal.  08/02/16  Yes Estill Dooms, MD  nitroGLYCERIN (NITROSTAT) 0.4 MG SL tablet Place 1 tablet (0.4 mg total) under the tongue every 5 (five) minutes as needed for chest pain. 01/31/18  Yes Croitoru, Mihai, MD  omeprazole (PRILOSEC) 40 MG capsule Take 1 capsule (40 mg total) by mouth 2 (two) times daily. 08/11/18  Yes Mansouraty, Telford Nab., MD  ondansetron (ZOFRAN ODT) 8 MG disintegrating tablet Take 1 tablet (8 mg total) by mouth every 8 (eight) hours as needed for nausea or vomiting. 08/22/18  Yes Mansouraty, Telford Nab., MD  rosuvastatin (CRESTOR) 40 MG tablet Take one tablet by mouth once daily for cholesterol Patient taking differently: Take 40 mg by mouth at bedtime.  10/23/17  Yes Reed, Tiffany L, DO  traMADol (ULTRAM) 50 MG tablet Take 1 tablet (50 mg total) by mouth every 8 (eight) hours as needed. Patient taking differently: Take 50 mg by mouth every 8 (eight)  hours as needed (for pain.).  03/05/18  Yes Lauree Chandler, NP    Current Facility-Administered Medications  Medication Dose Route Frequency Provider Last Rate Last Dose  . 0.9 %  sodium chloride infusion   Intravenous Continuous Etta Quill, DO 100 mL/hr at 08/24/18 1158    . acetaminophen (TYLENOL) tablet 650 mg  650 mg Oral Q6H PRN Etta Quill, DO   650 mg at 08/23/18 2332   Or  . acetaminophen (TYLENOL) suppository 650 mg  650 mg Rectal Q6H PRN Etta Quill, DO      . aspirin chewable tablet 81 mg  81 mg Oral Daily Jennette Kettle M, DO   81 mg at 08/24/18 6301  . carvedilol (COREG) tablet 12.5 mg  12.5 mg Oral BID Jennette Kettle M, DO   12.5 mg at 08/24/18 6010  . clopidogrel (PLAVIX) tablet 75 mg  75 mg Oral Q breakfast Etta Quill, DO   75 mg at 08/24/18 9323  . dicyclomine (BENTYL) capsule 10 mg  10 mg Oral TID PRN Etta Quill, DO      . ezetimibe (ZETIA) tablet 10 mg  10 mg Oral QHS Jennette Kettle M, DO   10 mg at 08/23/18 2136  . feeding supplement (  BOOST / RESOURCE BREEZE) liquid 1 Container  1 Container Oral TID BM Alma Friendly, MD   1 Container at 08/24/18 [phone]  . heparin injection 5,000 Units  5,000 Units Subcutaneous Q8H Etta Quill, DO   5,000 Units at 08/24/18 2409  . hydrALAZINE (APRESOLINE) tablet 25 mg  25 mg Oral BID Etta Quill, DO   25 mg at 08/24/18 7353  . insulin aspart (novoLOG) injection 0-9 Units  0-9 Units Subcutaneous Q4H Etta Quill, DO   2 Units at 08/24/18 0147  . latanoprost (XALATAN) 0.005 % ophthalmic solution 1 drop  1 drop Both Eyes QHS Jennette Kettle M, DO   1 drop at 08/23/18 2141  . ondansetron (ZOFRAN) tablet 4 mg  4 mg Oral Q6H PRN Etta Quill, DO       Or  . ondansetron [facility]) injection 4 mg  4 mg Intravenous Q6H PRN Etta Quill, DO      . pantoprazole (PROTONIX) EC tablet 80 mg  80 mg Oral BID Etta Quill, DO   80 mg at 08/24/18 2992  . rosuvastatin (CRESTOR) tablet 40 mg  40 mg Oral  QHS Jennette Kettle M, DO   40 mg at 08/23/18 2138  . traMADol (ULTRAM) tablet 50 mg  50 mg Oral Q8H PRN Etta Quill, DO   50 mg at 08/23/18 1601    Allergies as of 08/22/2018 - Review Complete 08/22/2018  Allergen Reaction Noted  . Iron Hives 02/14/2012  . Vicodin [hydrocodone-acetaminophen] Other (See Comments) 06/19/2011  . Amiodarone Nausea And Vomiting 11/03/2015  . Norvasc [amlodipine besylate] Nausea And Vomiting 03/04/2013  . Promethazine Other (See Comments) 12/31/2012  . Vioxx [rofecoxib] Nausea And Vomiting 06/19/2011     Review of Systems:    Constitutional: No fever or chills Skin: No rash Cardiovascular: No chest pain   Respiratory: No SOB  Gastrointestinal: See HPI and otherwise negative Genitourinary: No dysuria  Neurological: No headache, dizziness or syncope Musculoskeletal: No new muscle or joint pain Hematologic: No bleeding  Psychiatric: No history of depression or anxiety    Physical Exam:  Vital signs in last 24 hours: Temp:  [97.5 F (36.4 C)-98.3 F (36.8 C)] 97.5 F (36.4 C) (10/20 0455) Pulse Rate:  [52-63] 63 (10/20 0455) Resp:  [18] 18 (10/20 0455) BP: (102-145)/(47-75) 144/75 (10/20 0924) SpO2:  [98 %] 98 % (10/20 0455) Weight:  [65.8 kg] 65.8 kg (10/20 0402) Last BM Date: 08/23/18 General:   Pleasant elderly AA female appears to be in NAD, Well developed, Well nourished, alert and cooperative Head:  Normocephalic and atraumatic. Eyes:   PEERL, EOMI. No icterus. Conjunctiva pink. Ears:  Normal auditory acuity. Neck:  Supple Throat: Oral cavity and pharynx without inflammation, swelling or lesion.  Lungs: Respirations even and unlabored. Lungs clear to auscultation bilaterally.   No wheezes, crackles, or rhonchi.  Heart: Normal S1, S2. No MRG. Regular rate and rhythm. No peripheral edema, cyanosis or pallor.  Abdomen:  Soft, nondistended, Moderate generalized abdominal ttp, some worse on left side, No rebound or guarding. Normal bowel  sounds. No appreciable masses or hepatomegaly. Rectal:  Not performed.  Msk:  Symmetrical without gross deformities. Peripheral pulses intact.  Extremities:  Without edema, no deformity or joint abnormality.  Neurologic:  Alert and  oriented x4;  grossly normal neurologically.  Skin:   Dry and intact without significant lesions or rashes. Psychiatric: Demonstrates good judgement and reason without abnormal affect or behaviors.   LAB RESULTS: Recent  Labs    08/22/18 2319 08/24/18 0531  WBC 10.0 5.1  HGB 9.3* 7.8*  HCT 30.9* 26.0*  PLT 263 202   BMET Recent Labs    08/22/18 2319 08/23/18 0608 08/24/18 0531  NA 131* 132* 132*  K 4.7 4.0 3.8  CL 98 102 104  CO2 21* 22 21*  GLUCOSE 175* 130* 102*  BUN 22 22 16   CREATININE 2.29* 2.03* 1.65*  CALCIUM 8.7* 8.4* 7.7*   LFT Recent Labs    08/22/18 2319  PROT 6.5  ALBUMIN 2.7*  AST 50*  ALT 28  ALKPHOS 60  BILITOT 0.8     Impression / Plan:   Impression: 1.  Left lower quadrant pain: Continues, chronic over the past 3 weeks ever since renal stenting, most recent CTA 05/28/2018 with moderate to severe stenosis involving the celiac, SMA and bilateral renal arteries; question relation to this 2.  Nausea and vomiting: Likely related to recent antibiotics for H. pylori plus/minus Feraheme infusion 3.  H. pylori: H. pylori stool antigen positive, patient did finish 7 days of clarithromycin, omeprazole and amoxicillin but then discontinued therapy due to above 4.  Iron deficiency anemia: Chronic for the patient, no recent endoscopic procedures, these were delayed given patient's current anticoagulation with Plavix status post renal stent placement, preferably physician recommended waiting 3 months to hold Plavix  Plan: 1.   Discussed with patient that her left lower quadrant pain may be related to her vascular disease.  We will need to discuss further with Dr. Gwenlyn Found 2.  Could consider repeat CTA 3.  Suspect that nausea and  vomiting will continue to improve now that patient is off of antibiotics 4.  Decrease Pantoprazole from 80 mg twice daily to 40 mg twice daily 5.  Ordered 2 doses of MiraLAX today, will need to see how patient is doing tomorrow, may need to continue daily after this 6.  Please await further recommendations from Dr. Hilarie Fredrickson later today    Thank you for your kind consultation, we will continue to follow.  Lavone Nian Sheritha Louis  08/24/2018, 12:53 PM

## 2018-08-25 ENCOUNTER — Ambulatory Visit: Payer: Medicare Other | Admitting: Physician Assistant

## 2018-08-25 ENCOUNTER — Inpatient Hospital Stay (HOSPITAL_COMMUNITY): Payer: Medicare Other

## 2018-08-25 DIAGNOSIS — R1030 Lower abdominal pain, unspecified: Secondary | ICD-10-CM

## 2018-08-25 DIAGNOSIS — I739 Peripheral vascular disease, unspecified: Secondary | ICD-10-CM

## 2018-08-25 LAB — GLUCOSE, CAPILLARY
GLUCOSE-CAPILLARY: 116 mg/dL — AB (ref 70–99)
GLUCOSE-CAPILLARY: 134 mg/dL — AB (ref 70–99)
GLUCOSE-CAPILLARY: 140 mg/dL — AB (ref 70–99)
GLUCOSE-CAPILLARY: 157 mg/dL — AB (ref 70–99)
Glucose-Capillary: 102 mg/dL — ABNORMAL HIGH (ref 70–99)
Glucose-Capillary: 96 mg/dL (ref 70–99)
Glucose-Capillary: 174 mg/dL — ABNORMAL HIGH (ref 70–99)

## 2018-08-25 LAB — CBC WITH DIFFERENTIAL/PLATELET
Abs Immature Granulocytes: 0.02 10*3/uL (ref 0.00–0.07)
BASOS ABS: 0 10*3/uL (ref 0.0–0.1)
Basophils Relative: 1 %
EOS PCT: 3 %
Eosinophils Absolute: 0.1 10*3/uL (ref 0.0–0.5)
HCT: 26.2 % — ABNORMAL LOW (ref 36.0–46.0)
Hemoglobin: 7.8 g/dL — ABNORMAL LOW (ref 12.0–15.0)
Immature Granulocytes: 0 %
Lymphocytes Relative: 15 %
Lymphs Abs: 0.8 10*3/uL (ref 0.7–4.0)
MCH: 23 pg — ABNORMAL LOW (ref 26.0–34.0)
MCHC: 29.8 g/dL — AB (ref 30.0–36.0)
MCV: 77.3 fL — ABNORMAL LOW (ref 80.0–100.0)
Monocytes Absolute: 0.7 10*3/uL (ref 0.1–1.0)
Monocytes Relative: 14 %
NRBC: 0 % (ref 0.0–0.2)
Neutro Abs: 3.4 10*3/uL (ref 1.7–7.7)
Neutrophils Relative %: 67 %
Platelets: 194 10*3/uL (ref 150–400)
RBC: 3.39 MIL/uL — ABNORMAL LOW (ref 3.87–5.11)
RDW: 17.7 % — AB (ref 11.5–15.5)
WBC: 5.1 10*3/uL (ref 4.0–10.5)

## 2018-08-25 LAB — BASIC METABOLIC PANEL
Anion gap: 6 (ref 5–15)
BUN: 12 mg/dL (ref 8–23)
CO2: 20 mmol/L — AB (ref 22–32)
Calcium: 7.7 mg/dL — ABNORMAL LOW (ref 8.9–10.3)
Chloride: 108 mmol/L (ref 98–111)
Creatinine, Ser: 1.45 mg/dL — ABNORMAL HIGH (ref 0.44–1.00)
GFR calc non Af Amer: 33 mL/min — ABNORMAL LOW (ref >60)
GFR, EST AFRICAN AMERICAN: 39 mL/min — AB (ref >60)
GLUCOSE: 113 mg/dL — AB (ref 70–99)
Potassium: 4.4 mmol/L (ref 3.5–5.1)
SODIUM: 134 mmol/L — AB (ref 135–145)

## 2018-08-25 MED ORDER — ADULT MULTIVITAMIN W/MINERALS CH
1.0000 | ORAL_TABLET | Freq: Every day | ORAL | Status: DC
  Administered 2018-08-25 – 2018-08-29 (×6): 1 via ORAL
  Filled 2018-08-25 (×5): qty 1

## 2018-08-25 NOTE — Evaluation (Signed)
Physical Therapy Evaluation Patient Details Name: Kendra Todd MRN: [id number] DOB: [birth year] Today's Date: 08/25/2018   History of Present Illness  77 yo female with onset of poor appetite, dizziness and weakness was admitted with recent history of B renal artery stenting.  Transfusions of iron and ABT, stopped as not helpful.  PMHx:  abd pain, URI, BPPV, CAD, RAD, PAF, DM, HTN, stroke, endarterectomy, goiter, back surgeries, abnormal liver function,   Clinical Impression  Pt was given time for gait and strength testing today, noted her difficulty of using RW due to LLE weakness but typically does not need RW.  Pt is agreeable for HHPT and will hopefully transition to outpatient therapy after to recover her strength and balance as prior to this hospitalization     Follow Up Recommendations Home health PT    Equipment Recommendations  Rolling walker with 5" wheels    Recommendations for Other Services       Precautions / Restrictions Precautions Precautions: Fall Precaution Comments: prefers to walk with RW Restrictions Weight Bearing Restrictions: No      Mobility  Bed Mobility Overal bed mobility: Needs Assistance Bed Mobility: Supine to Sit;Sit to Supine     Supine to sit: Min assist Sit to supine: Min assist      Transfers Overall transfer level: Modified independent Equipment used: Rolling walker (2 wheeled)             General transfer comment: pt used good hand placement for standing  Ambulation/Gait Ambulation/Gait assistance: Min guard Gait Distance (Feet): 150 Feet Assistive device: Rolling walker (2 wheeled) Gait Pattern/deviations: Step-through pattern;Decreased stride length;Trunk flexed Gait velocity: reduced Gait velocity interpretation: <1.31 ft/sec, indicative of household ambulator General Gait Details: controls her balance wtih RW  Stairs            Wheelchair Mobility    Modified Rankin (Stroke Patients Only)       Balance  Overall balance assessment: Needs assistance Sitting-balance support: Feet supported Sitting balance-Leahy Scale: Fair                                       Pertinent Vitals/Pain Pain Assessment: No/denies pain    Home Living                        Prior Function                 Hand Dominance        Extremity/Trunk Assessment                Communication      Cognition Arousal/Alertness: Awake/alert Behavior During Therapy: WFL for tasks assessed/performed Overall Cognitive Status: Within Functional Limits for tasks assessed                                        General Comments General comments (skin integrity, edema, etc.): Pt was able to maneuver with supervision from PT for her controlled walk with RW on hall.  Has some help at home with famiily and could return there as needed    Exercises     Assessment/Plan    PT Assessment    PT Problem List         PT Treatment Interventions      PT  Goals (Current goals can be found in the Care Plan section)  Acute Rehab PT Goals Patient Stated Goal: to get stronger    Frequency Min 2X/week   Barriers to discharge        Co-evaluation               AM-PAC PT "6 Clicks" Daily Activity  Outcome Measure Difficulty turning over in bed (including adjusting bedclothes, sheets and blankets)?: Unable Difficulty moving from lying on back to sitting on the side of the bed? : A Little Difficulty sitting down on and standing up from a chair with arms (e.g., wheelchair, bedside commode, etc,.)?: Unable Help needed moving to and from a bed to chair (including a wheelchair)?: A Little Help needed walking in hospital room?: A Little Help needed climbing 3-5 steps with a railing? : A Little 6 Click Score: 14    End of Session Equipment Utilized During Treatment: Gait belt Activity Tolerance: Patient tolerated treatment well;Patient limited by fatigue;Treatment  limited secondary to medical complications (Comment) Patient left: in bed;with call bell/phone within reach;with nursing/sitter in room Nurse Communication: Mobility status PT Visit Diagnosis: Unsteadiness on feet (R26.81);Muscle weakness (generalized) (M62.81)    Time: 5521-7471 PT Time Calculation (min) (ACUTE ONLY): 29 min   Charges:   PT Evaluation $PT Eval Moderate Complexity: 1 Mod PT Treatments $Gait Training: 8-22 mins       Ramond Dial 08/25/2018, 9:02 PM   Mee Hives, PT MS Acute Rehab Dept. Number: [place] [facility] [place]

## 2018-08-25 NOTE — Progress Notes (Signed)
BP "soft" patient reports no new symptoms.  Stokes Rattigan, RN

## 2018-08-25 NOTE — Progress Notes (Signed)
     [facility] Progress Note   Chief Complaint:   Nausea/ vomiting, LLQ pain, anemia   SUBJECTIVE:    tolerating some clears. Still had LLQ discomfort this am.    ASSESSMENT AND PLAN:   1. 53 y F with constellation of GI symptoms. N/V felt to be secondary to H.pylori treatment (she took about 7 days).  -holding H.pylori antibiotics -tolerating clears  2. LLQ pain. Started abruptly following renal artery stenting raising concern for steal syndrome/ mesenteric ischemia.  -I spoke with Hospitalist, will ask Cardiology to evaluate - need repeat imaging?.   3. IDA. On plavix. Baseline hgb mid 8 to mid 9 range, down to 7.8 this admit.  -needs diagnostic ECL. Could do inpatient on Plavix with understanding that polypectomy could not be performed and if polyps found then it would require repeat exam when able to hold plavix. Patient prefers to wait until plavix can be held rather than risk need for repeat colonoscopy down the road.  4. AKI on CKD3   OBJECTIVE:     Vital signs in last 24 hours: Temp:  [97.5 F (36.4 C)-98.1 F (36.7 C)] 97.9 F (36.6 C) (10/21 1138) Pulse Rate:  [60-68] 60 (10/21 1138) Resp:  [16-20] 16 (10/21 1138) BP: (91-165)/(45-88) 91/45 (10/21 1138) SpO2:  [95 %-100 %] 95 % (10/21 1138) Weight:  [67.8 kg] 67.8 kg (10/21 0419) Last BM Date: 08/23/18 General:   Alert, well-developed, female in NAD EENT:  Normal hearing, non icteric sclera, conjunctive pink.  Heart:  Regular rate and rhythm, no lower extremity edema Pulm: Normal respiratory effort Abdomen:  Soft, nondistended, nontender.  Normal bowel sounds, no masses felt.     Neurologic:  Alert and  oriented x4;  grossly normal neurologically. Psych:  Pleasant, cooperative.  Normal mood and affect.   Intake/Output from previous day: 10/20 0701 - 10/21 0700 In: 1020 [P.O.:1020] Out: 1200 [Urine:1200] Intake/Output this shift: Total I/O In: 420 [P.O.:120; I.V.:300] Out: 400  [Urine:400]  Lab Results: Recent Labs    08/22/18 2319 08/24/18 0531 08/25/18 0530  WBC 10.0 5.1 5.1  HGB 9.3* 7.8* 7.8*  HCT 30.9* 26.0* 26.2*  PLT 263 202 194   BMET Recent Labs    08/23/18 0608 08/24/18 0531 08/25/18 0530  NA 132* 132* 134*  K 4.0 3.8 4.4  CL 102 104 108  CO2 22 21* 20*  GLUCOSE 130* 102* 113*  BUN 22 16 12   CREATININE 2.03* 1.65* 1.45*  CALCIUM 8.4* 7.7* 7.7*   LFT Recent Labs    08/22/18 2319  PROT 6.5  ALBUMIN 2.7*  AST 50*  ALT 28  ALKPHOS 60  BILITOT 0.8     Principal Problem:   Nausea & vomiting Active Problems:   CAD (coronary artery disease): 1993 PTCA to circumflex.  2002 2 BMS stents to ostial, S/P CABG x 3 12/04/12   Bilateral renal artery stenosis (HCC)   Renovascular hypertension   Labile blood pressure   LLQ abdominal pain   Orthostatic hypotension   H. pylori infection    LOS: 1 day   Tye Savoy ,NP 08/25/2018, 11:53 AM

## 2018-08-25 NOTE — Progress Notes (Signed)
Initial Nutrition Assessment  DOCUMENTATION CODES:   Not applicable  INTERVENTION:   -Continue Boost Breeze po TID, each supplement provides 250 kcal and 9 grams of protein -MVI with minerals daily  NUTRITION DIAGNOSIS:   Inadequate oral intake related to altered GI function, decreased appetite as evidenced by per patient/family report, meal completion < 50%.  GOAL:   Patient will meet greater than or equal to 90% of their needs  MONITOR:   PO intake, Supplement acceptance, Weight trends, Labs, Skin, I & O's  REASON FOR ASSESSMENT:   Malnutrition Screening Tool    ASSESSMENT:   Kendra Todd is a 77 y.o. female with medical history significant of RAS, CAD, HLD, PAF, DM2, HTN, Stroke, AAA.  Patient presents to the ED with c/o weakness and dizziness that onset this evening.  Pt admitted with nausea, vomiting, and LLQ pain, suspected due to H Pylori treatment.  Case discussed with RN prior to visit.   Spoke with pt at bedside, who reports poor appetite over the past 3 weeks, which she attributes to early satiety and nausea. Pt shares that she has only been able to eat small amounts of meals due to early satiety- she tried to push herself to eat more, however, in doing so she would experience extreme nausea. Pt also shares that food odors would exacerbate the nausea. Observed pt's lunch tray- she consumed about 80% of tomato soup and 50% of pudding. She has also been sipping on Boost Breeze supplements. PTA she was consuming Ensure and Premier Protein, however, discontinued this when she started experiencing bloating.  Pt reports UBW is around 150-155#. She estimates that she has lost 6-7# within the past 3 weeks. Per wt hx, pt has experienced a 3.1% wt loss over the past months, which is not significant for time frame.   Discussed with pt importance of good meal and supplement intake to promote healing. Encouraged pt to eat her food at meals. Offered to modify supplement order, but  pt requested to stay with Boost Breeze to prevent GI distress.   Last Hgb A1c: 5.9 (06/03/18). PTA DM medications are 1000 mg metformin BID.   Labs reviewed: CBGS: 96-174 (inpatient orders for glycemic control are 0-9 units insulin aspart every 4 hours).   NUTRITION - FOCUSED PHYSICAL EXAM:    Most Recent Value  Orbital Region  No depletion  Upper Arm Region  No depletion  Thoracic and Lumbar Region  No depletion  Buccal Region  No depletion  Temple Region  No depletion  Clavicle Bone Region  No depletion  Clavicle and Acromion Bone Region  No depletion  Scapular Bone Region  No depletion  Dorsal Hand  No depletion  Patellar Region  No depletion  Anterior Thigh Region  No depletion  Posterior Calf Region  Mild depletion  Edema (RD Assessment)  None  Hair  Reviewed  Eyes  Reviewed  Mouth  Reviewed  Skin  Reviewed  Nails  Reviewed       Diet Order:   Diet Order            Diet full liquid Room service appropriate? Yes; Fluid consistency: Thin  Diet effective now              EDUCATION NEEDS:   Education needs have been addressed  Skin:  Skin Assessment: Reviewed RN Assessment  Last BM:  08/23/18  Height:   Ht Readings from Last 1 Encounters:  08/22/18 5\' 2"  (1.575 m)    Weight:  Wt Readings from Last 1 Encounters:  08/25/18 67.8 kg    Ideal Body Weight:  50 kg  BMI:  Body mass index is 27.33 kg/m.  Estimated Nutritional Needs:   Kcal:  1700-1900  Protein:  75-90 grams  Fluid:  1.7-1.9 L    Tiari Andringa A. Jimmye Norman, RD, LDN, CDE Pager: [phone] After hours Pager: [phone]

## 2018-08-25 NOTE — Progress Notes (Signed)
PROGRESS NOTE  Kendra Todd PTW:[id number] DOB: Sep 21, 1941 DOA: 08/22/2018 PCP: [name], DO  HPI/Recap of past 24 hours: HPI from Dr [name] is a 77 y.o. female with medical history significant of RAS, CAD, HLD, PAF, DM2, HTN, Stroke, AAA, CKD presents to the ED with c/o abdominal pain, n/v, weakness/dizziness for the past few days. Pt reported having a stent to her renal artery about 3 weeks ago and since then, has been having LLQ abdominal pain, with recent non bloody vomiting and nausea. Pt saw Dr. [name] with [facility] on 10/1.  He did note that symptoms not typical mesenteric ischemia however that this was still considered a possibility.  Recd endoscopic evaluation though patient was advised by cardiology, not to disrupt her [facility] for the next 3 months due to her recently placed stents for RAS. GI also tried treating her for H. Pylori of which she couldn't tolerate the AB and stopped 10/18. Pt also was noted to have iron def anemia and was given IV iron infusion on 10/7. Due to significant dizziness, EMS was called, patient found to have severe orthostatic hypotension with BP dropping from 126/52 all the way to 68/32 when standing. In the ED, pt noted to be in AKI on CKD, given IVF and admitted for further management.   Today, pt still reported feeling about the same, LLQ abdominal pain, denies any new complaints.  Assessment/Plan: Principal Problem:   Nausea & vomiting Active Problems:   CAD (coronary artery disease): 1993 PTCA to circumflex.  2002 2 BMS stents to ostial, S/P CABG x 3 12/04/12   Bilateral renal artery stenosis (HCC)   Renovascular hypertension   Labile blood pressure   LLQ abdominal pain   Orthostatic hypotension   H. pylori infection  LLQ abdominal pain/H.pylori infection Associated with nausea/vomiting, constipation Afebrile, no leukocytosis Unclear etiology LFTs, lipase, lactic acid all WNL Unable to complete H.pylori treatment 2/2  N/V May consider CTA abdomen pelvis, with improving AKI GI consulted, appreciate recs Cardiology consulted Continue IV fluids Pain, antiemetic management, stool softners Full liquid diet  Orthostatic hypotension/dizziness Resolving Likely due to nausea/vomiting, dehydration IV fluids  AKI on CKD stage III Improving Baseline creatinine 1.5, on admission 2.29 Likely prerenal Continue IV fluids, avoid nephrotoxic's Strict I's and O's  Iron deficiency/microcytic anemia Baseline hemoglobin around 9, currently 7.8, ?dilutional Daily CBC  Hyponatremia Improving Likely due to dehydration Continue IV fluids Daily BMP  Hypertension Somewhat soft, monitor closely Continue Coreg, hydralazine Hold Lasix, hydrochlorothiazide  DM type II Last A1c 5.9 on 05/2018, controlled SSI, Accu-Cheks Hold home metformin  Renal artery stenosis s/p stent placement Recent ultrasound showed patent stents Continue aspirin, Plavix, statins      Code Status: Full  Family Communication: None at bedside  Disposition Plan: TBD once w/u complete   Consultants:  GI  Cardiology   Procedures:  None  Antimicrobials:  None  DVT prophylaxis: Hold Heparin due to drop in hgb   Objective: Vitals:   08/24/18 2201 08/25/18 0419 08/25/18 0956 08/25/18 1138  BP: (!) 165/71 129/64 (!) 162/71 (!) 91/45  Pulse:  68 68 60  Resp:  19 18 16   Temp:  98.1 F (36.7 C)  97.9 F (36.6 C)  TempSrc:  Oral  Oral  SpO2:  97% 100% 95%  Weight:  67.8 kg    Height:        Intake/Output Summary (Last 24 hours) at 08/25/2018 1441 Last data filed at 08/25/2018 1004 Gross  per 24 hour  Intake 1440 ml  Output 1100 ml  Net 340 ml   Filed Weights   08/23/18 0435 08/24/18 0402 08/25/18 0419  Weight: 63.2 kg 65.8 kg 67.8 kg    Exam:   General: NAD  Cardiovascular: S1, S2 present  Respiratory: CTAB  Abdomen: Soft, mild TTP on LLQ, ND, BS present  Musculoskeletal: No pedal edema  present  Skin: Normal  Psychiatry: Normal mood   Data Reviewed: CBC: Recent Labs  Lab 08/22/18 2319 08/24/18 0531 08/25/18 0530  WBC 10.0 5.1 5.1  NEUTROABS 7.4 3.4 3.4  HGB 9.3* 7.8* 7.8*  HCT 30.9* 26.0* 26.2*  MCV 77.6* 77.6* 77.3*  PLT 263 202 264   Basic Metabolic Panel: Recent Labs  Lab 08/22/18 2319 08/23/18 0608 08/24/18 0531 08/25/18 0530  NA 131* 132* 132* 134*  K 4.7 4.0 3.8 4.4  CL 98 102 104 108  CO2 21* 22 21* 20*  GLUCOSE 175* 130* 102* 113*  BUN 22 22 16 12   CREATININE 2.29* 2.03* 1.65* 1.45*  CALCIUM 8.7* 8.4* 7.7* 7.7*   GFR: Estimated Creatinine Clearance: 28.4 mL/min (A) (by C-G formula based on SCr of 1.45 mg/dL (H)). Liver Function Tests: Recent Labs  Lab 08/22/18 2319  AST 50*  ALT 28  ALKPHOS 60  BILITOT 0.8  PROT 6.5  ALBUMIN 2.7*   Recent Labs  Lab 08/22/18 2319  LIPASE 35   No results for input(s): AMMONIA in the last 168 hours. Coagulation Profile: No results for input(s): INR, PROTIME in the last 168 hours. Cardiac Enzymes: No results for input(s): CKTOTAL, CKMB, CKMBINDEX, TROPONINI in the last 168 hours. BNP (last 3 results) No results for input(s): PROBNP in the last 8760 hours. HbA1C: No results for input(s): HGBA1C in the last 72 hours. CBG: Recent Labs  Lab 08/25/18 0042 08/25/18 0156 08/25/18 0420 08/25/18 0749 08/25/18 1136  GLUCAP 157* 140* 102* 96 174*   Lipid Profile: No results for input(s): CHOL, HDL, LDLCALC, TRIG, CHOLHDL, LDLDIRECT in the last 72 hours. Thyroid Function Tests: No results for input(s): TSH, T4TOTAL, FREET4, T3FREE, THYROIDAB in the last 72 hours. Anemia Panel: No results for input(s): VITAMINB12, FOLATE, FERRITIN, TIBC, IRON, RETICCTPCT in the last 72 hours. Urine analysis:    Component Value Date/Time   COLORURINE YELLOW 08/23/2018 1920   APPEARANCEUR CLEAR 08/23/2018 1920   LABSPEC 1.014 08/23/2018 1920   PHURINE 7.0 08/23/2018 1920   GLUCOSEU 50 (A) 08/23/2018 1920    HGBUR NEGATIVE 08/23/2018 1920   BILIRUBINUR NEGATIVE 08/23/2018 1920   BILIRUBINUR neg 07/18/2017 1118   KETONESUR NEGATIVE 08/23/2018 1920   PROTEINUR 100 (A) 08/23/2018 1920   UROBILINOGEN negative (A) 07/18/2017 1118   UROBILINOGEN 1.0 07/09/2014 0955   NITRITE NEGATIVE 08/23/2018 1920   LEUKOCYTESUR NEGATIVE 08/23/2018 1920   Sepsis Labs: @LABRCNTIP (procalcitonin:4,lacticidven:4)  )No results found for this or any previous visit (from the past 240 hour(s)).    Studies: Dg Abd 2 Views  Result Date: 08/25/2018 CLINICAL DATA:  Abdominal pain for 1 week EXAM: ABDOMEN - 2 VIEW COMPARISON:  05/28/2018 FINDINGS: Scattered large and small bowel gas is noted. No abnormal mass or abnormal calcifications are seen. No obstructive changes are noted. Bilateral renal stents are seen. Mild degenerative change of the lumbar spine is noted. IMPRESSION: No acute abnormality noted. Electronically Signed   By: Inez Catalina M.D.   On: 08/25/2018 08:19    Scheduled Meds: . aspirin  81 mg Oral Daily  . carvedilol  12.5 mg Oral  BID  . clopidogrel  75 mg Oral Q breakfast  . ezetimibe  10 mg Oral QHS  . feeding supplement  1 Container Oral TID BM  . hydrALAZINE  25 mg Oral BID  . insulin aspart  0-9 Units Subcutaneous Q4H  . latanoprost  1 drop Both Eyes QHS  . pantoprazole  40 mg Oral BID  . rosuvastatin  40 mg Oral QHS    Continuous Infusions: . sodium chloride 100 mL/hr at 08/25/18 0740     LOS: 1 day     Alma Friendly, MD [facility]   If 7PM-7AM, please contact night-coverage [email or web address]  08/25/2018, 2:41 PM

## 2018-08-26 ENCOUNTER — Inpatient Hospital Stay (HOSPITAL_COMMUNITY): Payer: Medicare Other

## 2018-08-26 ENCOUNTER — Ambulatory Visit: Payer: Medicare Other | Admitting: Cardiovascular Disease

## 2018-08-26 DIAGNOSIS — R6881 Early satiety: Secondary | ICD-10-CM

## 2018-08-26 DIAGNOSIS — I150 Renovascular hypertension: Secondary | ICD-10-CM

## 2018-08-26 DIAGNOSIS — R634 Abnormal weight loss: Secondary | ICD-10-CM

## 2018-08-26 DIAGNOSIS — I2510 Atherosclerotic heart disease of native coronary artery without angina pectoris: Secondary | ICD-10-CM

## 2018-08-26 DIAGNOSIS — R0989 Other specified symptoms and signs involving the circulatory and respiratory systems: Secondary | ICD-10-CM

## 2018-08-26 DIAGNOSIS — A048 Other specified bacterial intestinal infections: Secondary | ICD-10-CM

## 2018-08-26 DIAGNOSIS — I701 Atherosclerosis of renal artery: Secondary | ICD-10-CM

## 2018-08-26 DIAGNOSIS — R1084 Generalized abdominal pain: Secondary | ICD-10-CM

## 2018-08-26 DIAGNOSIS — I951 Orthostatic hypotension: Secondary | ICD-10-CM

## 2018-08-26 DIAGNOSIS — R112 Nausea with vomiting, unspecified: Secondary | ICD-10-CM

## 2018-08-26 DIAGNOSIS — D509 Iron deficiency anemia, unspecified: Secondary | ICD-10-CM

## 2018-08-26 LAB — CBC WITH DIFFERENTIAL/PLATELET
Abs Immature Granulocytes: 0.01 10*3/uL (ref 0.00–0.07)
BASOS ABS: 0 10*3/uL (ref 0.0–0.1)
Basophils Relative: 0 %
EOS PCT: 3 %
Eosinophils Absolute: 0.2 10*3/uL (ref 0.0–0.5)
HCT: 26.6 % — ABNORMAL LOW (ref 36.0–46.0)
Hemoglobin: 8 g/dL — ABNORMAL LOW (ref 12.0–15.0)
IMMATURE GRANULOCYTES: 0 %
LYMPHS PCT: 17 %
Lymphs Abs: 0.8 10*3/uL (ref 0.7–4.0)
MCH: 23.3 pg — ABNORMAL LOW (ref 26.0–34.0)
MCHC: 30.1 g/dL (ref 30.0–36.0)
MCV: 77.3 fL — AB (ref 80.0–100.0)
MONOS PCT: 15 %
Monocytes Absolute: 0.7 10*3/uL (ref 0.1–1.0)
Neutro Abs: 3.1 10*3/uL (ref 1.7–7.7)
Neutrophils Relative %: 65 %
PLATELETS: 206 10*3/uL (ref 150–400)
RBC: 3.44 MIL/uL — ABNORMAL LOW (ref 3.87–5.11)
RDW: 17.8 % — ABNORMAL HIGH (ref 11.5–15.5)
WBC: 4.8 10*3/uL (ref 4.0–10.5)
nRBC: 0 % (ref 0.0–0.2)

## 2018-08-26 LAB — GLUCOSE, CAPILLARY
GLUCOSE-CAPILLARY: 119 mg/dL — AB (ref 70–99)
GLUCOSE-CAPILLARY: 124 mg/dL — AB (ref 70–99)
Glucose-Capillary: 119 mg/dL — ABNORMAL HIGH (ref 70–99)
Glucose-Capillary: 136 mg/dL — ABNORMAL HIGH (ref 70–99)
Glucose-Capillary: 137 mg/dL — ABNORMAL HIGH (ref 70–99)
Glucose-Capillary: 145 mg/dL — ABNORMAL HIGH (ref 70–99)
Glucose-Capillary: 88 mg/dL (ref 70–99)

## 2018-08-26 LAB — BASIC METABOLIC PANEL
ANION GAP: 7 (ref 5–15)
BUN: 9 mg/dL (ref 8–23)
CHLORIDE: 105 mmol/L (ref 98–111)
CO2: 19 mmol/L — ABNORMAL LOW (ref 22–32)
Calcium: 7.9 mg/dL — ABNORMAL LOW (ref 8.9–10.3)
Creatinine, Ser: 1.41 mg/dL — ABNORMAL HIGH (ref 0.44–1.00)
GFR calc Af Amer: 40 mL/min — ABNORMAL LOW (ref >60)
GFR, EST NON AFRICAN AMERICAN: 34 mL/min — AB (ref >60)
GLUCOSE: 161 mg/dL — AB (ref 70–99)
POTASSIUM: 4 mmol/L (ref 3.5–5.1)
Sodium: 131 mmol/L — ABNORMAL LOW (ref 135–145)

## 2018-08-26 MED ORDER — IOPAMIDOL (ISOVUE-370) INJECTION 76%
80.0000 mL | Freq: Once | INTRAVENOUS | Status: AC | PRN
  Administered 2018-08-26: 80 mL via INTRAVENOUS

## 2018-08-26 MED ORDER — IOPAMIDOL (ISOVUE-370) INJECTION 76%
INTRAVENOUS | Status: AC
  Filled 2018-08-26: qty 100

## 2018-08-26 MED ORDER — POLYETHYLENE GLYCOL 3350 17 GM/SCOOP PO POWD
0.5000 | Freq: Once | ORAL | Status: AC
  Administered 2018-08-26: 127.5 g via ORAL
  Filled 2018-08-26: qty 255

## 2018-08-26 NOTE — Progress Notes (Signed)
PROGRESS NOTE  Kendra Todd QJF:[id number] DOB: June 13, 1941 DOA: 08/22/2018 PCP: [name], DO  HPI/Recap of past 24 hours: HPI from Dr [name] is a 77 y.o. female with medical history significant of RAS, CAD, HLD, PAF, DM2, HTN, Stroke, AAA, CKD presents to the ED with c/o abdominal pain, n/v, weakness/dizziness for the past few days. Pt reported having a stent to her renal artery about 3 weeks ago and since then, has been having LLQ abdominal pain, with recent non bloody vomiting and nausea. Pt saw Dr. [name] with [facility] on 10/1.  He did note that symptoms not typical mesenteric ischemia however that this was still considered a possibility.  Recd endoscopic evaluation though patient was advised by cardiology, not to disrupt her [facility] for the next 3 months due to her recently placed stents for RAS. GI also tried treating her for H. Pylori of which she couldn't tolerate the AB and stopped 10/18. Pt also was noted to have iron def anemia and was given IV iron infusion on 10/7. Due to significant dizziness, EMS was called, patient found to have severe orthostatic hypotension with BP dropping from 126/52 all the way to 68/32 when standing. In the ED, pt noted to be in AKI on CKD, given IVF and admitted for further management.   Today, pt denies any new complaints. Awaiting cardiology evaluation.  Assessment/Plan: Principal Problem:   Nausea & vomiting Active Problems:   CAD (coronary artery disease): 1993 PTCA to circumflex.  2002 2 BMS stents to ostial, S/P CABG x 3 12/04/12   Bilateral renal artery stenosis (HCC)   Renovascular hypertension   Labile blood pressure   LLQ abdominal pain   Orthostatic hypotension   H. pylori infection  LLQ abdominal pain/H.pylori infection Ongoing  Associated with nausea/vomiting, constipation Afebrile, no leukocytosis Unclear etiology, ?steal phenomenon from recently placed stents in renal arteries LFTs, lipase, lactic acid all  WNL Unable to complete H.pylori treatment 2/2 N/V GI consulted, appreciate recs, please see note for details Cardiology consult pending S/P IV fluids Pain, antiemetic management, stool softners Full liquid diet  Orthostatic hypotension/dizziness Resolved Likely due to nausea/vomiting, dehydration S/P IV fluids  AKI on CKD stage III Improved, back to baseline Baseline creatinine 1.5, on admission 2.29 Likely prerenal S/P IV fluids, avoid nephrotoxic's Strict I's and O's  Iron deficiency/microcytic anemia Baseline hemoglobin around 9, currently 8, ?dilutional Daily CBC  Hyponatremia Ongoing S/P IV fluid Daily BMP  Hypertension Stable Continue Coreg, hydralazine Hold Lasix, hydrochlorothiazide  Chronic diastolic HF/pulmonary HTN Currently euvolemic, monitor closely as pt received IVF, may need to re-start home lasix ECHO on 02/2018 showed EF of 60-65%, G2DD, PA peak pressure 49 mm Hg Monitor closely  DM type II Last A1c 5.9 on 05/2018, controlled SSI, Accu-Cheks Hold home metformin  Renal artery stenosis s/p stent placement Recent ultrasound showed patent stents Continue aspirin, Plavix, statins      Code Status: Full  Family Communication: Son at bedside  Disposition Plan: TBD once w/u complete   Consultants:  GI  Cardiology   Procedures:  None  Antimicrobials:  None  DVT prophylaxis: Heparin   Objective: Vitals:   08/26/18 0423 08/26/18 0427 08/26/18 0430 08/26/18 0838  BP: (!) 139/52 (!) 119/55 (!) 124/49 140/83  Pulse: 62 70 69 69  Resp: 20     Temp: 98.1 F (36.7 C)     TempSrc: Oral     SpO2: 97% 100%  97%  Weight: 69.6 kg  68 kg   Height:        Intake/Output Summary (Last 24 hours) at 08/26/2018 1002 Last data filed at 08/26/2018 0843 Gross per 24 hour  Intake 1250.9 ml  Output 1050 ml  Net 200.9 ml   Filed Weights   08/25/18 0419 08/26/18 0423 08/26/18 0430  Weight: 67.8 kg 69.6 kg 68 kg    Exam:   General:  NAD  Cardiovascular: S1, S2 present  Respiratory: Bibasilar crackles noted  Abdomen: Soft, mild TTP on LLQ, ND, BS present  Musculoskeletal: No pedal edema present  Skin: Normal  Psychiatry: Normal mood   Data Reviewed: CBC: Recent Labs  Lab 08/22/18 2319 08/24/18 0531 08/25/18 0530 08/26/18 0532  WBC 10.0 5.1 5.1 4.8  NEUTROABS 7.4 3.4 3.4 3.1  HGB 9.3* 7.8* 7.8* 8.0*  HCT 30.9* 26.0* 26.2* 26.6*  MCV 77.6* 77.6* 77.3* 77.3*  PLT 263 202 194 263   Basic Metabolic Panel: Recent Labs  Lab 08/22/18 2319 08/23/18 0608 08/24/18 0531 08/25/18 0530 08/26/18 0532  NA 131* 132* 132* 134* 131*  K 4.7 4.0 3.8 4.4 4.0  CL 98 102 104 108 105  CO2 21* 22 21* 20* 19*  GLUCOSE 175* 130* 102* 113* 161*  BUN 22 22 16 12 9   CREATININE 2.29* 2.03* 1.65* 1.45* 1.41*  CALCIUM 8.7* 8.4* 7.7* 7.7* 7.9*   GFR: Estimated Creatinine Clearance: 29.3 mL/min (A) (by C-G formula based on SCr of 1.41 mg/dL (H)). Liver Function Tests: Recent Labs  Lab 08/22/18 2319  AST 50*  ALT 28  ALKPHOS 60  BILITOT 0.8  PROT 6.5  ALBUMIN 2.7*   Recent Labs  Lab 08/22/18 2319  LIPASE 35   No results for input(s): AMMONIA in the last 168 hours. Coagulation Profile: No results for input(s): INR, PROTIME in the last 168 hours. Cardiac Enzymes: No results for input(s): CKTOTAL, CKMB, CKMBINDEX, TROPONINI in the last 168 hours. BNP (last 3 results) No results for input(s): PROBNP in the last 8760 hours. HbA1C: No results for input(s): HGBA1C in the last 72 hours. CBG: Recent Labs  Lab 08/25/18 1957 08/26/18 0029 08/26/18 0414 08/26/18 0548 08/26/18 0753  GLUCAP 116* 119* 88 145* 119*   Lipid Profile: No results for input(s): CHOL, HDL, LDLCALC, TRIG, CHOLHDL, LDLDIRECT in the last 72 hours. Thyroid Function Tests: No results for input(s): TSH, T4TOTAL, FREET4, T3FREE, THYROIDAB in the last 72 hours. Anemia Panel: No results for input(s): VITAMINB12, FOLATE, FERRITIN, TIBC, IRON,  RETICCTPCT in the last 72 hours. Urine analysis:    Component Value Date/Time   COLORURINE YELLOW 08/23/2018 1920   APPEARANCEUR CLEAR 08/23/2018 1920   LABSPEC 1.014 08/23/2018 1920   PHURINE 7.0 08/23/2018 1920   GLUCOSEU 50 (A) 08/23/2018 1920   HGBUR NEGATIVE 08/23/2018 1920   BILIRUBINUR NEGATIVE 08/23/2018 1920   BILIRUBINUR neg 07/18/2017 1118   KETONESUR NEGATIVE 08/23/2018 1920   PROTEINUR 100 (A) 08/23/2018 1920   UROBILINOGEN negative (A) 07/18/2017 1118   UROBILINOGEN 1.0 07/09/2014 0955   NITRITE NEGATIVE 08/23/2018 1920   LEUKOCYTESUR NEGATIVE 08/23/2018 1920   Sepsis Labs: @LABRCNTIP (procalcitonin:4,lacticidven:4)  )No results found for this or any previous visit (from the past 240 hour(s)).    Studies: No results found.  Scheduled Meds: . aspirin  81 mg Oral Daily  . carvedilol  12.5 mg Oral BID  . clopidogrel  75 mg Oral Q breakfast  . ezetimibe  10 mg Oral QHS  . feeding supplement  1 Container Oral TID BM  . hydrALAZINE  25 mg Oral BID  . insulin aspart  0-9 Units Subcutaneous Q4H  . latanoprost  1 drop Both Eyes QHS  . multivitamin with minerals  1 tablet Oral Daily  . pantoprazole  40 mg Oral BID  . rosuvastatin  40 mg Oral QHS    Continuous Infusions:    LOS: 2 days     Alma Friendly, MD [facility]   If 7PM-7AM, please contact night-coverage [email or web address]  08/26/2018, 10:02 AM

## 2018-08-26 NOTE — Consult Note (Addendum)
Cardiology Consultation:   Patient ID: Kendra Todd MRN: [id number]; DOB: 1941-04-20  Admit date: 08/22/2018 Date of Consult: 08/26/2018  Primary Care Provider: [name], DO Primary Cardiologist: [name], MD  Primary Electrophysiologist:  None  PV specialist: Dr. Gwenlyn Found   Patient Profile:   Kendra Todd is a 77 y.o. female with a hx of CAD s/p CABG in 2014, thoracoabdominal aortic aneurysm, CKD stage III, left subclavian stenosis, severe pulmonary artery hypertension, carotid artery disease s/p bilateral CEA, history of CVA, hypertension, hyperlipidemia, DM 2 and PAF who is being seen today for the evaluation of LUQ abdominal pain at the request of Dr. Horris Latino.  History of Present Illness:   Kendra Todd is a pleasant 77 year old female with past medical history of CAD s/p CABG in 2014, thoracoabdominal aortic aneurysm, CKD stage III, left subclavian stenosis, severe pulmonary artery hypertension, carotid artery disease s/p bilateral CEA, history of CVA, hypertension, hyperlipidemia, DM 2 and PAF.  Patient had a history of stent in RCA in 2002 and several angioplasty prior to that.  Echocardiogram in January 2011 showed severe pulmonary hypertension, PA peak pressure 73, grade 2 DD, moderate MR.  She had bilateral carotid artery enterectomy with recurrent stenosis.  One of her surgery was complicated by a dissection flap in the stroke.  She does not have significant neurological residual since.  Her thoracoabdominal aortic aneurysm is being followed by vascular surgery Dr. Oneida Alar.  She also had previous TEE guided DC cardioversion for paroxysmal A. fib in 2012.  More recently, she was seen by Dr. [name] in August 2019, a renal artery ultrasound was obtained which showed 70 to 99% stenosis in the superior mesenteric artery and celiac artery, greater than 60% stenosis in the right renal artery, greater than 60% stenosis in the left renal artery as well.  Patient was referred to Dr. Gwenlyn Found  and underwent planned renal angioplasty on 07/21/2018 with bilateral renal artery stenting.  I last saw the patient on 08/06/2018, she was doing well at the time.  However after renal artery stenting, she had a nonspecific right left upper quadrant abdominal discomfort.  She was recently seen by GI and was tested positive for H. pylori by stool test.  And has been placed on antibiotic.  She presented to [facility]2018 with generalized weakness, and dizziness, nausea and vomiting.  She was found to have severe orthostatic hypotension on arrival with systolic blood pressure dropping from the 120s down to 60s upon standing.  She also noted to have acute on chronic renal insufficiency as well which has been attributed to significant nausea and vomiting.  During the hospitalization, she has been given large amount of IV fluid, her creatinine has improved from 2.2 to 1.41.  Despite so, she continued to complain of left upper quadrant abdominal discomfort.  Lactic acid, liver function test and lipase were all normal.  She has not been able to complete her H. pylori treatment secondary to nausea and vomiting.  GI has been consulted, her nausea and vomiting was felt to be induced by H. pylori treatment.  Her recent symptom does not occur with food, therefore suspicion for superior mesenteric ischemia is very low.  Due to the need for 6-month uninterrupted Plavix, she was unable to proceed with upper EGD and colonoscopy for evaluation of anemia.  Cardiology has been consulted for additional input.   Past Medical History:  Diagnosis Date  . Abdominal pain, epigastric   . Acute upper respiratory infections  of unspecified site   . Arthritis   . Benign paroxysmal positional vertigo   . CAD (coronary artery disease)    prior coronary stenting  . Candidiasis of vulva and vagina   . Carotid artery disease ([place]) 2001   s/p Bilateral CEA, after CVA  . Cervicalgia   . Decreased pedal pulses 06/04/2011     doppler - bilateral ABIs no evidence of insufficiency; L CIA slightly elevated velocities 20-30% diameter reduction;   . Dyspnea 03/13/2006   Myoview - EF 76%; mild ischemia in apical lateral region, less severe than previous study in 2002  . Edema   . H/O carotid endarterectomy 03/06/2012   CT angio -high grade stenosis of proximal R internal carotid w/ lg posterior ulceration or dissection flap; near occlusive stenosis at origin of nondominantproximal L vertebral artery; <50% stenosis of proximal R vertebral and proximal L subclavian artery  . H/O endarterectomy 09/11/2012   patent L and R carotid sites, R 60-79% restenosed ICA; L <40% restenosed ICA  . H/O endarterectomy 01/08/2012   doppler - R distal common carotid/proximal ICA stenosed 80-99%, L 0-39%  . HTN (hypertension) 11/27/2012  . Hyperlipidemia   . Limb pain 05/02/2011   doppler - no evidence of thrombus of thrombophlebitis  . Long term (current) use of anticoagulants   . Loss of weight   . Lumbago   . Myalgia and myositis, unspecified   . Myocardial infarction ([place])   . Nonspecific abnormal results of liver function study   . Nontoxic uninodular goiter   . Osteoarthrosis, unspecified whether generalized or localized, unspecified site   . Other malaise and fatigue   . Other manifestations of vitamin A deficiency   . PAF (paroxysmal atrial fibrillation) ([place]) 02/21/2011  . Pain in joint, pelvic region and thigh   . Pain in limb   . Peripheral vascular disease ([place])   . Phlebitis and thrombophlebitis of superficial vessels of lower extremities   . Primary localized osteoarthrosis, hand   . Pulmonary HTN ([place]) 12/03/2012  . Renal artery stenosis ([place])   . Routine gynecological examination   . Spinal stenosis, unspecified region other than cervical   . Stenosis of artery ([place]) 06/03/2012   doppler- most distal aspect of abd aorta no aneurysmal dilation; bilateral ABIs - mild L arterial insufficiency, L CIA narrowing w/ 50-69%  diameter reduction; L and R SFA both w/ 0-49% diameter reductions  . Stroke ([place]) 2001  . Superficial vein thrombosis 05/11/2015   Left forehead  . Tobacco use disorder   . TR (tricuspid regurgitation), severe 12/02/12 for repair with CABG 12/03/2012  . Type II or unspecified type diabetes mellitus with peripheral circulatory disorders, not stated as uncontrolled(250.70)   . Unspecified cataract   . Unspecified disorder of iris and ciliary body   . Unspecified vitamin D deficiency   . Urinary tract infection, site not specified     Past Surgical History:  Procedure Laterality Date  . ABDOMINAL HYSTERECTOMY  1975  . ANGIOPLASTY  1984 and 1982  . APPENDECTOMY  1954  . ARCH AORTOGRAM  03/30/2011   40% recurrent stenosis at origin of R proximal carotid patch, shelf like recurrent stenosis w/in midsection of patch that does not appear to be flow limiting; normal non-stenosed great vessel origins  . BACK SURGERY  '84,'86, '87   three previous surgeries  . CAROTID ENDARTERECTOMY  2001   Bilateral  . CORONARY ARTERY BYPASS GRAFT  12/04/2012   Procedure: CORONARY ARTERY BYPASS GRAFTING (CABG);  Surgeon: Ivin Poot, MD; LIMA-LAD, SVG-OM, SVG-RCA  . INTRAOPERATIVE TRANSESOPHAGEAL ECHOCARDIOGRAM  12/04/2012   Procedure: INTRAOPERATIVE TRANSESOPHAGEAL ECHOCARDIOGRAM;  Surgeon: Ivin Poot, MD;  Location: [place];  Service: Open Heart Surgery;  Laterality: N/A;  . KIDNEY STONE SURGERY     Removal  . LEFT HEART CATHETERIZATION WITH CORONARY ANGIOGRAM N/A 11/28/2012   Procedure: LEFT HEART CATHETERIZATION WITH CORONARY ANGIOGRAM;  Surgeon: Leonie Man, MD;  Location: [facility] CATH LAB;  Service: Cardiovascular;  Laterality: N/A;  . PERCUTANEOUS CORONARY STENT INTERVENTION (PCI-S)  2002   2 BMS in ostial/Prox & mid RCA (mid 2.5 mm 20x  mm, & 2.75 mm x  8 mm ostial)  . PERIPHERAL VASCULAR INTERVENTION  07/21/2018   Procedure: PERIPHERAL VASCULAR INTERVENTION;  Surgeon: Lorretta Harp, MD;  Location:  [place] CV LAB;  Service: Cardiovascular;;  bilateral renal stents  . RENAL ANGIOGRAM N/A 11/30/2013   Procedure: RENAL ANGIOGRAM;  Surgeon: Lorretta Harp, MD;  Location: [facility] CATH LAB;  Service: Cardiovascular;  Laterality: N/A;  . RENAL ANGIOGRAPHY N/A 07/21/2018   Procedure: RENAL ANGIOGRAPHY;  Surgeon: Lorretta Harp, MD;  Location: [place] CV LAB;  Service: Cardiovascular;  Laterality: N/A;  . RIGHT HEART CATHETERIZATION N/A 12/02/2012   Procedure: RIGHT HEART CATH;  Surgeon: Troy Sine, MD;  [facility];  Service: Cardiovascular;  Laterality: N/A;  . TEE WITH CARDIOVERSION  01/23/2011   EF 26-83%; grade 2 diastolic dysfunction, elevated mean LA filling pressure, RV systolic pressure increased consistent w/ mod pulmonary hypertension     Home Medications:  Prior to Admission medications   Medication Sig Start Date End Date Taking? Authorizing Provider  alendronate (FOSAMAX) 70 MG tablet Take 70 mg by mouth every Friday.  07/13/16  Yes [provider]  aspirin 81 MG tablet Take 81 mg by mouth daily.    Yes [provider]  bimatoprost (LUMIGAN) 0.01 % SOLN Place 1 drop into both eyes at bedtime.   Yes [provider]  carvedilol (COREG) 25 MG tablet TAKE ONE-HALF (1/2) TABLET TWICE A DAY WITH MEALS (NEEDS OFFICE VISIT) Patient taking differently: Take 12.5 mg by mouth 2 (two) times daily.  04/21/18  Yes Croitoru, Mihai, MD  clopidogrel (PLAVIX) 75 MG tablet Take 1 tablet (75 mg total) by mouth daily with breakfast. 07/23/18  Yes Daune Perch, NP  dicyclomine (BENTYL) 10 MG capsule Take 3 times daily as needed for spasm. Patient taking differently: Take 10 mg by mouth 3 (three) times daily as needed for spasms.  08/05/18  Yes Mansouraty, Telford Nab., MD  ezetimibe (ZETIA) 10 MG tablet Take 10 mg by mouth at bedtime.    Yes [provider]  furosemide (LASIX) 20 MG tablet Take 20 mg by mouth daily as needed (for fluid retention.).   06/04/18  Yes [provider]  hydrALAZINE (APRESOLINE) 25 MG tablet Take 1 tablet (25 mg total) by mouth 2 (two) times daily. 08/06/18  Yes [name], PA  hydrochlorothiazide (HYDRODIURIL) 12.5 MG tablet Take 1 tablet (12.5 mg total) by mouth daily. 08/06/18  Yes [name], PA  meloxicam (MOBIC) 7.5 MG tablet Take 7.5 mg by mouth daily.   Yes [provider]  metFORMIN (GLUCOPHAGE) 1000 MG tablet TAKE 1 TABLET TWICE A DAY WITH MEALS Patient taking differently: Take 1,000 mg by mouth 2 (two) times daily with a meal.  08/02/16  Yes Estill Dooms, MD  nitroGLYCERIN (NITROSTAT) 0.4 MG SL tablet Place 1 tablet (0.4  mg total) under the tongue every 5 (five) minutes as needed for chest pain. 01/31/18  Yes Croitoru, Mihai, MD  omeprazole (PRILOSEC) 40 MG capsule Take 1 capsule (40 mg total) by mouth 2 (two) times daily. 08/11/18  Yes Mansouraty, Telford Nab., MD  ondansetron (ZOFRAN ODT) 8 MG disintegrating tablet Take 1 tablet (8 mg total) by mouth every 8 (eight) hours as needed for nausea or vomiting. 08/22/18  Yes Mansouraty, Telford Nab., MD  rosuvastatin (CRESTOR) 40 MG tablet Take one tablet by mouth once daily for cholesterol Patient taking differently: Take 40 mg by mouth at bedtime.  10/23/17  Yes Reed, Tiffany L, DO  traMADol (ULTRAM) 50 MG tablet Take 1 tablet (50 mg total) by mouth every 8 (eight) hours as needed. Patient taking differently: Take 50 mg by mouth every 8 (eight) hours as needed (for pain.).  03/05/18  Yes Lauree Chandler, NP    Inpatient Medications: Scheduled Meds: . aspirin  81 mg Oral Daily  . carvedilol  12.5 mg Oral BID  . clopidogrel  75 mg Oral Q breakfast  . ezetimibe  10 mg Oral QHS  . feeding supplement  1 Container Oral TID BM  . hydrALAZINE  25 mg Oral BID  . insulin aspart  0-9 Units Subcutaneous Q4H  . latanoprost  1 drop Both Eyes QHS  . multivitamin with minerals  1 tablet Oral Daily  . pantoprazole  40 mg Oral BID  . rosuvastatin  40 mg  Oral QHS   Continuous Infusions:  PRN Meds: acetaminophen **OR** acetaminophen, dicyclomine, ondansetron **OR** ondansetron (ZOFRAN) IV, traMADol  Allergies:    Allergies  Allergen Reactions  . Iron Hives  . Vicodin [Hydrocodone-Acetaminophen] Other (See Comments)    Caused arrythmia   . Amiodarone Nausea And Vomiting  . Norvasc [Amlodipine Besylate] Nausea And Vomiting  . Promethazine Other (See Comments)    hallucinations  . Vioxx [Rofecoxib] Nausea And Vomiting    Social History:   Social History   Socioeconomic History  . Marital status: Widowed    Spouse name: Not on file  . Number of children: Not on file  . Years of education: Not on file  . Highest education level: Not on file  Occupational History  . Occupation: retired  Scientific laboratory technician  . Financial resource strain: Not hard at all  . Food insecurity:    Worry: Never true    Inability: Never true  . Transportation needs:    Medical: No    Non-medical: No  Tobacco Use  . Smoking status: Former Smoker    Packs/day: 0.30    Years: 57.00    Pack years: 17.10    Types: Cigarettes    Last attempt to quit: 11/19/2012    Years since quitting: 5.7  . Smokeless tobacco: Never Used  Substance and Sexual Activity  . Alcohol use: Yes    Comment: rarely, 1 a month  . Drug use: No  . Sexual activity: Not Currently  Lifestyle  . Physical activity:    Days per week: 0 days    Minutes per session: 0 min  . Stress: Only a little  Relationships  . Social connections:    Talks on phone: More than three times a week    Gets together: More than three times a week    Attends religious service: More than 4 times per year    Active member of club or organization: Yes    Attends meetings of clubs or organizations: More than  4 times per year    Relationship status: Widowed  . Intimate partner violence:    Fear of current or ex partner: No    Emotionally abused: No    Physically abused: No    Forced sexual activity: No    Other Topics Concern  . Not on file  Social History Narrative  . Not on file    Family History:    Family History  Problem Relation Age of Onset  . Other Mother        CVA  . Diabetes Mother   . Heart disease Mother        Heart disease before age 24  . Heart attack Mother   . Heart disease Father        Heart Disease before age 29  . Heart attack Father   . Heart disease Sister        Amputation  . Diabetes Sister   . Heart disease Brother        Heart disease before age 26  . Diabetes Brother   . Stroke Brother   . Stroke Brother   . Heart disease Brother   . Stroke Brother   . Heart disease Brother   . Heart disease Sister   . Heart disease Sister   . Diabetes Daughter   . Cancer Sister   . Diabetes Other   . Cancer Other        breast  . Heart disease Other        cad  . Other Other        cva  . Colon cancer Neg Hx   . Esophageal cancer Neg Hx   . Inflammatory bowel disease Neg Hx   . Liver disease Neg Hx   . Pancreatic cancer Neg Hx   . Rectal cancer Neg Hx   . Stomach cancer Neg Hx      ROS:  Please see the history of present illness.   All other ROS reviewed and negative.     Physical Exam/Data:   Vitals:   08/26/18 0423 08/26/18 0427 08/26/18 0430 08/26/18 0838  BP: (!) 139/52 (!) 119/55 (!) 124/49 140/83  Pulse: 62 70 69 69  Resp: 20     Temp: 98.1 F (36.7 C)     TempSrc: Oral     SpO2: 97% 100%  97%  Weight: 69.6 kg  68 kg   Height:        Intake/Output Summary (Last 24 hours) at 08/26/2018 1103 Last data filed at 08/26/2018 0843 Gross per 24 hour  Intake 950.9 ml  Output 650 ml  Net 300.9 ml   Filed Weights   08/25/18 0419 08/26/18 0423 08/26/18 0430  Weight: 67.8 kg 69.6 kg 68 kg   Body mass index is 27.42 kg/m.  General:  Well nourished, well developed, in no acute distress HEENT: normal Lymph: no adenopathy Neck: no JVD Endocrine:  No thryomegaly Vascular: No carotid bruits; FA pulses 2+ bilaterally without  bruits  Cardiac:  normal S1, S2; RRR; no murmur  Lungs:  clear to auscultation bilaterally, no wheezing, rhonchi or rales  Abd: soft, no hepatomegaly  +tender in the LUQ Ext: no edema Musculoskeletal:  No deformities, BUE and BLE strength normal and equal Skin: warm and dry  Neuro:  CNs 2-12 intact, no focal abnormalities noted Psych:  Normal affect   EKG:  The EKG was personally reviewed and demonstrates:  NSR without significant ST-T wave changes Telemetry:  Telemetry was  personally reviewed and demonstrates:  NSR without significant ventricular ectyopy  Relevant CV Studies:  Echo 02/03/2018 LV EF: 60% -   65% Study Conclusions  - Left ventricle: The cavity size was normal. Wall thickness was   increased in a pattern of moderate LVH. Systolic function was   normal. The estimated ejection fraction was in the range of 60%   to 65%. Wall motion was normal; there were no regional wall   motion abnormalities. Features are consistent with a pseudonormal   left ventricular filling pattern, with concomitant abnormal   relaxation and increased filling pressure (grade 2 diastolic   dysfunction). - Aortic valve: Moderately calcified annulus. - Left atrium: The atrium was severely dilated. - Tricuspid valve: There was mild-moderate regurgitation. - Pulmonary arteries: Systolic pressure was moderately increased.   PA peak pressure: 49 mm Hg (S).   Renal artery doppler 08/18/2018 Summary: Largest Aortic Diameter: 4.1 cm  Renal:  Right: Normal size right kidney. Evidence of a greater than 60%    stenosis of the right renal artery. Abnormal right Resistive    Index. RRV flow present. Normal cortical thickness of right    kidney. Left: Normal size of left kidney. 1-59% stenosis of the left renal    artery. Abnormal left Resisitve Index. LRV flow present.    Normal cortical thickness of the left kidney. Mesenteric: Normal Celiac artery findings. 70 to 99%  stenosis in the superior mesenteric Artery.    Laboratory Data:  Chemistry Recent Labs  Lab 08/24/18 0531 08/25/18 0530 08/26/18 0532  NA 132* 134* 131*  K 3.8 4.4 4.0  CL 104 108 105  CO2 21* 20* 19*  GLUCOSE 102* 113* 161*  BUN 16 12 9   CREATININE 1.65* 1.45* 1.41*  CALCIUM 7.7* 7.7* 7.9*  GFRNONAA 28* 33* 34*  GFRAA 33* 39* 40*  ANIONGAP 7 6 7     Recent Labs  Lab 08/22/18 2319  PROT 6.5  ALBUMIN 2.7*  AST 50*  ALT 28  ALKPHOS 60  BILITOT 0.8   Hematology Recent Labs  Lab 08/24/18 0531 08/25/18 0530 08/26/18 0532  WBC 5.1 5.1 4.8  RBC 3.35* 3.39* 3.44*  HGB 7.8* 7.8* 8.0*  HCT 26.0* 26.2* 26.6*  MCV 77.6* 77.3* 77.3*  MCH 23.3* 23.0* 23.3*  MCHC 30.0 29.8* 30.1  RDW 17.2* 17.7* 17.8*  PLT 202 194 206   Cardiac EnzymesNo results for input(s): TROPONINI in the last 168 hours. No results for input(s): TROPIPOC in the last 168 hours.  BNPNo results for input(s): BNP, PROBNP in the last 168 hours.  DDimer No results for input(s): DDIMER in the last 168 hours.  Radiology/Studies:  Dg Chest Port 1 View  Result Date: 08/26/2018 CLINICAL DATA:  Shortness of breath EXAM: PORTABLE CHEST 1 VIEW COMPARISON:  CT chest 05/28/2018 FINDINGS: Diffuse bilateral interstitial thickening. Small left pleural effusion. No pneumothorax. No focal consolidation. Stable cardiomegaly. Prior CABG. Tortuous aorta. No acute osseous abnormality. IMPRESSION: Findings concerning for mild CHF. Electronically Signed   By: Kathreen Devoid   On: 08/26/2018 10:05   Dg Abd 2 Views  Result Date: 08/25/2018 CLINICAL DATA:  Abdominal pain for 1 week EXAM: ABDOMEN - 2 VIEW COMPARISON:  05/28/2018 FINDINGS: Scattered large and small bowel gas is noted. No abnormal mass or abnormal calcifications are seen. No obstructive changes are noted. Bilateral renal stents are seen. Mild degenerative change of the lumbar spine is noted. IMPRESSION: No acute abnormality noted. Electronically Signed   By: Linus Mako.D.  On: 08/25/2018 08:19    Assessment and Plan:   1. Left upper abdominal pain:   - Has been present since bilateral renal artery stent placement.  Repeat renal artery duplex obtained on 08/18/2018 showed 60% stenosis of the right renal artery, abnormal right restrictive index, 1 to 59% stenosis in the left renal artery.  1 year repeat image was recommended.    - Ultrasound seems to be quite stable and unlikely contributing to her symptom.    - Her abdominal pain does not occur with food, she was noted to have 70 to 99% stenosis in the superior mesenteric artery on recent Doppler, however her symptom is inconsistent with the finding.  Since her renal function has improved, recommend CT angiogram of the abdomen and pelvis. Given recently diagnosed h pylori, her symptom is concerning for ulcer.  2. H. pylori infection: Tested positive for using stool sample.  Unable to complete full course of antibiotic due to significant nausea vomiting  3. Acute on chronic renal insufficiency: improved.   4. Renal artery stenosis: recently underwent bilateral renal artery stenting  5. Superior mesenteric artery stenosis.  For questions or updates, please contact [place] Please consult [email or web address] for contact info under   Signed, [name], [place]  08/26/2018 11:03 AM   The patient was seen, examined and discussed with [name], PA-C and I agree with the above.   Mrs Robart is a 77 year old female with a hx of CAD s/p CABG in 2014, thoracoabdominal aortic aneurysm, CKD stage III, left subclavian stenosis, severe pulmonary artery hypertension, carotid artery disease s/p bilateral CEA, history of CVA, hypertension, hyperlipidemia, DM 2 and PAF who is being seen today for the evaluation of LUQ abdominal pain. She underwent B/L renal artery stenting with a BMS (herculink) on 07/21/2018, she was started on Plavix with plan to continue for 3 months.  Her blood pressure has normalized after stenting.   He was also diagnosed with positive H. pylori and started on triple therapy.  She presented with worsening right upper quadrant pain, her labs show worsening microcytic anemia with hemoglobin of 7.8 and MCV of 77, she also had acute on chronic kidney failure with creatinine of 2 that is now improved to 1.4.  She states that her pain is worse after food intake. Her differential includes upper GI ulcers given microcytic anemia positive H. pylori, versus mesenteric ischemia secondary to superior mesenteric artery stenosis. I have discussed this case with Dr. Gwenlyn Found who states that this is stent has very low risk of in-stent thrombosis, and it safe to hold it for episodes of EGD and restarted once it safe from GI standpoint. Please call us with any questions, will follow.  Ena Dawley, MD 08/26/2018

## 2018-08-26 NOTE — Progress Notes (Signed)
I provided spiritual support for the patient during an initial visit. I listened to the patient with compassion as she talked about her family. I shared words of encouragement and led in prayer. I shared that the Chaplain is available for additional support as needed or requested.    08/26/18 1100  Clinical Encounter Type  Visited With Patient  Visit Type Spiritual support  Referral From Nurse  Consult/Referral To Chaplain  Spiritual Encounters  Spiritual Needs Prayer  Stress Factors  Patient Stress Factors None identified    Chaplain Dr Redgie Grayer

## 2018-08-26 NOTE — Progress Notes (Signed)
Daily Rounding Note  08/26/2018, 11:51 AM  LOS: 2 days   SUBJECTIVE:   Chief complaint: left abd pain.    Pain improved.  Several days without BM, chronic pattern for her.    OBJECTIVE:         Vital signs in last 24 hours:    Temp:  [98.1 F (36.7 C)-98.2 F (36.8 C)] 98.1 F (36.7 C) (10/22 0423) Pulse Rate:  [62-71] 69 (10/22 0838) Resp:  [20] 20 (10/22 0423) BP: (119-151)/(49-83) 140/83 (10/22 0838) SpO2:  [95 %-100 %] 97 % (10/22 0838) Weight:  [68 kg-69.6 kg] 68 kg (10/22 0430) Last BM Date: 08/23/18 Filed Weights   08/25/18 0419 08/26/18 0423 08/26/18 0430  Weight: 67.8 kg 69.6 kg 68 kg   General: looks pale, chronically ill   Heart: RRR Chest: clear bil.  No SOB Abdomen: soft, left abd slightly tender.  Active CAD  Neuro/Psych:  Alert, oreinted x 3.  Calm.    Intake/Output from previous day: 10/21 0701 - 10/22 0700 In: 1130.9 [P.O.:360; I.V.:770.9] Out: 1050 [Urine:1050]  Intake/Output this shift: Total I/O In: 240 [P.O.:240] Out: -   Lab Results: Recent Labs    08/24/18 0531 08/25/18 0530 08/26/18 0532  WBC 5.1 5.1 4.8  HGB 7.8* 7.8* 8.0*  HCT 26.0* 26.2* 26.6*  PLT 202 194 206   BMET Recent Labs    08/24/18 0531 08/25/18 0530 08/26/18 0532  NA 132* 134* 131*  K 3.8 4.4 4.0  CL 104 108 105  CO2 21* 20* 19*  GLUCOSE 102* 113* 161*  BUN 16 12 9   CREATININE 1.65* 1.45* 1.41*  CALCIUM 7.7* 7.7* 7.9*    Studies/Results: Dg Chest Port 1 View  Result Date: 08/26/2018 CLINICAL DATA:  Shortness of breath EXAM: PORTABLE CHEST 1 VIEW COMPARISON:  CT chest 05/28/2018 FINDINGS: Diffuse bilateral interstitial thickening. Small left pleural effusion. No pneumothorax. No focal consolidation. Stable cardiomegaly. Prior CABG. Tortuous aorta. No acute osseous abnormality. IMPRESSION: Findings concerning for mild CHF. Electronically Signed   By: Kathreen Devoid   On: 08/26/2018 10:05   Dg Abd 2  Views  Result Date: 08/25/2018 CLINICAL DATA:  Abdominal pain for 1 week EXAM: ABDOMEN - 2 VIEW COMPARISON:  05/28/2018 FINDINGS: Scattered large and small bowel gas is noted. No abnormal mass or abnormal calcifications are seen. No obstructive changes are noted. Bilateral renal stents are seen. Mild degenerative change of the lumbar spine is noted. IMPRESSION: No acute abnormality noted. Electronically Signed   By: Inez Catalina M.D.   On: 08/25/2018 08:19    ASSESMENT:   *   LLQ pain. Occurring after placing renal artery stent 07/21/18.  ? Steal syndrome.     *    IDA.  Recent Feraheme infusion.  Pt did not want to pursue Colonoscopy or EGD at 08/05/18 office visit.  No stool hemoccult results in Epic.     *   H Pylori + stool antigen, treated with biaxin/amoxil/.  suspect the meds caused n/v, constipation for > 1 week PTA.  These have been stopped.     *   A fib.      PLAN     *    Pt will to have EGD but prefers to wait for colonoscopy once Plavix can be discontinued.  Set up egd for tomorrow, pending approval of Dr Jerilynn Mages.    *   Miralax gatorade "prep" to address constipation, not for colonoscopy prep.  *  Solid food, carb mod diet.       Azucena Freed  08/26/2018, 11:51 AM Phone [phone]

## 2018-08-26 NOTE — H&P (View-Only) (Signed)
Daily Rounding Note  08/26/2018, 11:51 AM  LOS: 2 days   SUBJECTIVE:   Chief complaint: left abd pain.    Pain improved.  Several days without BM, chronic pattern for her.    OBJECTIVE:         Vital signs in last 24 hours:    Temp:  [98.1 F (36.7 C)-98.2 F (36.8 C)] 98.1 F (36.7 C) (10/22 0423) Pulse Rate:  [62-71] 69 (10/22 0838) Resp:  [20] 20 (10/22 0423) BP: (119-151)/(49-83) 140/83 (10/22 0838) SpO2:  [95 %-100 %] 97 % (10/22 0838) Weight:  [68 kg-69.6 kg] 68 kg (10/22 0430) Last BM Date: 08/23/18 Filed Weights   08/25/18 0419 08/26/18 0423 08/26/18 0430  Weight: 67.8 kg 69.6 kg 68 kg   General: looks pale, chronically ill   Heart: RRR Chest: clear bil.  No SOB Abdomen: soft, left abd slightly tender.  Active CAD  Neuro/Psych:  Alert, oreinted x 3.  Calm.    Intake/Output from previous day: 10/21 0701 - 10/22 0700 In: 1130.9 [P.O.:360; I.V.:770.9] Out: 1050 [Urine:1050]  Intake/Output this shift: Total I/O In: 240 [P.O.:240] Out: -   Lab Results: Recent Labs    08/24/18 0531 08/25/18 0530 08/26/18 0532  WBC 5.1 5.1 4.8  HGB 7.8* 7.8* 8.0*  HCT 26.0* 26.2* 26.6*  PLT 202 194 206   BMET Recent Labs    08/24/18 0531 08/25/18 0530 08/26/18 0532  NA 132* 134* 131*  K 3.8 4.4 4.0  CL 104 108 105  CO2 21* 20* 19*  GLUCOSE 102* 113* 161*  BUN 16 12 9   CREATININE 1.65* 1.45* 1.41*  CALCIUM 7.7* 7.7* 7.9*    Studies/Results: Dg Chest Port 1 View  Result Date: 08/26/2018 CLINICAL DATA:  Shortness of breath EXAM: PORTABLE CHEST 1 VIEW COMPARISON:  CT chest 05/28/2018 FINDINGS: Diffuse bilateral interstitial thickening. Small left pleural effusion. No pneumothorax. No focal consolidation. Stable cardiomegaly. Prior CABG. Tortuous aorta. No acute osseous abnormality. IMPRESSION: Findings concerning for mild CHF. Electronically Signed   By: Kathreen Devoid   On: 08/26/2018 10:05   Dg Abd 2  Views  Result Date: 08/25/2018 CLINICAL DATA:  Abdominal pain for 1 week EXAM: ABDOMEN - 2 VIEW COMPARISON:  05/28/2018 FINDINGS: Scattered large and small bowel gas is noted. No abnormal mass or abnormal calcifications are seen. No obstructive changes are noted. Bilateral renal stents are seen. Mild degenerative change of the lumbar spine is noted. IMPRESSION: No acute abnormality noted. Electronically Signed   By: Inez Catalina M.D.   On: 08/25/2018 08:19    ASSESMENT:   *   LLQ pain. Occurring after placing renal artery stent 07/21/18.  ? Steal syndrome.     *    IDA.  Recent Feraheme infusion.  Pt did not want to pursue Colonoscopy or EGD at 08/05/18 office visit.  No stool hemoccult results in Epic.     *   H Pylori + stool antigen, treated with biaxin/amoxil/.  suspect the meds caused n/v, constipation for > 1 week PTA.  These have been stopped.     *   A fib.      PLAN     *    Pt will to have EGD but prefers to wait for colonoscopy once Plavix can be discontinued.  Set up egd for tomorrow, pending approval of Dr Jerilynn Mages.    *   Miralax gatorade "prep" to address constipation, not for colonoscopy prep.  *  Solid food, carb mod diet.       Azucena Freed  08/26/2018, 11:51 AM Phone [phone]

## 2018-08-27 ENCOUNTER — Encounter (HOSPITAL_COMMUNITY)
Admission: EM | Disposition: A | Discharge status: Home Health | Payer: Self-pay | Source: Home / Self Care | Attending: Internal Medicine

## 2018-08-27 ENCOUNTER — Inpatient Hospital Stay (HOSPITAL_COMMUNITY): Payer: Medicare Other | Admitting: Anesthesiology

## 2018-08-27 ENCOUNTER — Encounter (HOSPITAL_COMMUNITY): Payer: Self-pay | Admitting: Certified Registered Nurse Anesthetist

## 2018-08-27 DIAGNOSIS — K229 Disease of esophagus, unspecified: Secondary | ICD-10-CM

## 2018-08-27 DIAGNOSIS — R109 Unspecified abdominal pain: Secondary | ICD-10-CM

## 2018-08-27 DIAGNOSIS — K208 Other esophagitis: Secondary | ICD-10-CM

## 2018-08-27 HISTORY — PX: ESOPHAGOGASTRODUODENOSCOPY (EGD) WITH PROPOFOL: SHX5813

## 2018-08-27 LAB — CBC WITH DIFFERENTIAL/PLATELET
ABS IMMATURE GRANULOCYTES: 0.02 10*3/uL (ref 0.00–0.07)
BASOS PCT: 1 %
Basophils Absolute: 0 10*3/uL (ref 0.0–0.1)
EOS ABS: 0.1 10*3/uL (ref 0.0–0.5)
EOS PCT: 3 %
HCT: 25.7 % — ABNORMAL LOW (ref 36.0–46.0)
Hemoglobin: 7.9 g/dL — ABNORMAL LOW (ref 12.0–15.0)
Immature Granulocytes: 0 %
Lymphocytes Relative: 17 %
Lymphs Abs: 0.8 10*3/uL (ref 0.7–4.0)
MCH: 23.5 pg — AB (ref 26.0–34.0)
MCHC: 30.7 g/dL (ref 30.0–36.0)
MCV: 76.5 fL — ABNORMAL LOW (ref 80.0–100.0)
MONOS PCT: 16 %
Monocytes Absolute: 0.7 10*3/uL (ref 0.1–1.0)
NEUTROS ABS: 2.8 10*3/uL (ref 1.7–7.7)
Neutrophils Relative %: 63 %
PLATELETS: 212 10*3/uL (ref 150–400)
RBC: 3.36 MIL/uL — ABNORMAL LOW (ref 3.87–5.11)
RDW: 17.9 % — AB (ref 11.5–15.5)
WBC: 4.5 10*3/uL (ref 4.0–10.5)
nRBC: 0 % (ref 0.0–0.2)

## 2018-08-27 LAB — BASIC METABOLIC PANEL
Anion gap: 7 (ref 5–15)
BUN: 9 mg/dL (ref 8–23)
CO2: 22 mmol/L (ref 22–32)
CREATININE: 1.52 mg/dL — AB (ref 0.44–1.00)
Calcium: 8.2 mg/dL — ABNORMAL LOW (ref 8.9–10.3)
Chloride: 105 mmol/L (ref 98–111)
GFR calc Af Amer: 36 mL/min — ABNORMAL LOW (ref >60)
GFR, EST NON AFRICAN AMERICAN: 31 mL/min — AB (ref >60)
GLUCOSE: 100 mg/dL — AB (ref 70–99)
POTASSIUM: 3.9 mmol/L (ref 3.5–5.1)
Sodium: 134 mmol/L — ABNORMAL LOW (ref 135–145)

## 2018-08-27 LAB — GLUCOSE, CAPILLARY
GLUCOSE-CAPILLARY: 129 mg/dL — AB (ref 70–99)
GLUCOSE-CAPILLARY: 137 mg/dL — AB (ref 70–99)
Glucose-Capillary: 92 mg/dL (ref 70–99)
Glucose-Capillary: 100 mg/dL — ABNORMAL HIGH (ref 70–99)
Glucose-Capillary: 120 mg/dL — ABNORMAL HIGH (ref 70–99)
Glucose-Capillary: 136 mg/dL — ABNORMAL HIGH (ref 70–99)

## 2018-08-27 SURGERY — ESOPHAGOGASTRODUODENOSCOPY (EGD) WITH PROPOFOL
Anesthesia: Monitor Anesthesia Care

## 2018-08-27 MED ORDER — BUTAMBEN-TETRACAINE-BENZOCAINE 2-2-14 % EX AERO
INHALATION_SPRAY | CUTANEOUS | Status: DC | PRN
  Administered 2018-08-27: 1 via TOPICAL

## 2018-08-27 MED ORDER — PROPOFOL 10 MG/ML IV BOLUS
INTRAVENOUS | Status: DC | PRN
  Administered 2018-08-27: 20 mg via INTRAVENOUS

## 2018-08-27 MED ORDER — PROPOFOL 500 MG/50ML IV EMUL
INTRAVENOUS | Status: DC | PRN
  Administered 2018-08-27: 50 ug/kg/min via INTRAVENOUS

## 2018-08-27 MED ORDER — ONDANSETRON HCL 4 MG/2ML IJ SOLN
INTRAMUSCULAR | Status: DC | PRN
  Administered 2018-08-27: 4 mg via INTRAVENOUS

## 2018-08-27 MED ORDER — LACTATED RINGERS IV SOLN
INTRAVENOUS | Status: DC | PRN
  Administered 2018-08-27: 08:00:00 via INTRAVENOUS

## 2018-08-27 MED ORDER — DIPHENHYDRAMINE HCL 50 MG/ML IJ SOLN
25.0000 mg | Freq: Once | INTRAMUSCULAR | Status: AC
  Administered 2018-08-28: 25 mg via INTRAVENOUS
  Filled 2018-08-27: qty 1

## 2018-08-27 MED ORDER — SODIUM CHLORIDE 0.9 % IV SOLN
510.0000 mg | Freq: Once | INTRAVENOUS | Status: AC
  Administered 2018-08-28: 510 mg via INTRAVENOUS
  Filled 2018-08-27: qty 17

## 2018-08-27 MED ORDER — FLUCONAZOLE 100 MG PO TABS
100.0000 mg | ORAL_TABLET | Freq: Every day | ORAL | Status: DC
  Administered 2018-08-27 – 2018-08-30 (×4): 100 mg via ORAL
  Filled 2018-08-27 (×4): qty 1

## 2018-08-27 MED ORDER — SODIUM CHLORIDE 0.9 % IV SOLN
INTRAVENOUS | Status: DC
  Administered 2018-08-27: 09:00:00 via INTRAVENOUS

## 2018-08-27 SURGICAL SUPPLY — 15 items

## 2018-08-27 NOTE — Anesthesia Procedure Notes (Signed)
Procedure Name: MAC Date/Time: 08/27/2018 8:40 AM Performed by: Carney Living, CRNA Pre-anesthesia Checklist: Patient identified, Emergency Drugs available, Suction available, Patient being monitored and Timeout performed Patient Re-evaluated:Patient Re-evaluated prior to induction Oxygen Delivery Method: Nasal cannula

## 2018-08-27 NOTE — Consult Note (Signed)
            [place] [facility] Primary Care Navigator  08/27/2018  ARTHUR AYDELOTTE [birth year] [id number]   Went to see patientat the bedside to identify possible discharge needs. Patient reports having "low blood pressure, dizziness, nausea and vomiting" which led to this admission. (renal artery stenosis status post stent placement, orthostatic hypotension/ dizziness, anemia)  Patient endorses [place] [facility] [employer] as herprimary care provider.   Patient shared [facility] on [address] and [employer] Order service to obtain medications withoutdifficulty.  Patientverbalized managing her ownmedications at homeusing "pill box" system filled every week.  Patientreportsthat she has been drivingprior to this admission but her daughter Veterinary surgeon) can be abletoprovide transportation to herdoctors'appointmentsif needed after discharge.  Patientstates that daughter lives with her and serves as the primary caregiver at home when needed.  Anticipated plan for discharge is home with home health services per therapy recommendation.  Patient voiced understanding to call primary care provider's office whenshe returns home, for a post discharge follow-up visit in 1- 2 weeks or sooner if needed.Patient letter (with PCP's contact number) was provided as her reminder.  Explained to patient about [facility] CM services available for health management and resources at home but she denies anyconcernsor needs at this timeandstrongly believes that she iscapable in managing her health conditions with her daughter's assistance (also diabetic).  Encouraged patient to seekreferral to [facility] care managementfrom primary care providerif deemed necessary andappropriate forany services in thenearfuture.  [facility] care management information provided for future needs that she may have.   Patienthowever,verbally agreedand optedforEMMIcalls  tofollow-up withherrecoveryat home.   Referral made for [facility] calls after discharge.    For additional questions please contact:  Edwena Felty A. Norva Bowe, BSN, RN-BC [facility] PRIMARY CARE Navigator Cell: [phone]

## 2018-08-27 NOTE — Progress Notes (Signed)
Pt states she wants to wait in the morning to get the iron. She wants to go to bed. She tired of waiting and wants to sleep. This nurse explained to patient about iron and whys she needs it and she state her understanding, but she is not refusing just want it reschedule so she can go to bed.

## 2018-08-27 NOTE — Progress Notes (Signed)
PROGRESS NOTE  Kendra Todd QHU:[id number] DOB: 02-Mar-1941 DOA: 08/22/2018 PCP: [name], DO  Brief narrative 77 y.o. female with medical history significant of RAS, CAD, HLD, PAF, DM2, HTN, Stroke, AAA, CKD, with recent bilateral renal artery stents placement by Dr. Gwenlyn Found,  presents to the ED with c/o abdominal pain, n/v, weakness/dizziness for the past few days.  She was noted to have hypotension, .  Subjective -Still reports some abdominal pain today, mild nausea, but no vomiting, denies any fever, chills, shortness of breath or chest pain  Assessment/Plan: Principal Problem:   Nausea & vomiting Active Problems:   CAD (coronary artery disease): 1993 PTCA to circumflex.  2002 2 BMS stents to ostial, S/P CABG x 3 12/04/12   Bilateral renal artery stenosis (HCC)   Renovascular hypertension   Labile blood pressure   LLQ abdominal pain   Orthostatic hypotension   H. pylori infection   Abdominal pain  Left-sided abdominal pain  -Unclear etiology, started after bilateral renal artery stent placement, unclear if still enema is contributing he in the setting with improving blood pressure after stent placement, and significant anemia -CTA abdomen significant for at least 50% luminal narrowing of the origin of SMA, but celiac artery is patent, IMA is atretic, though appears patent . -GI input greatly appreciated, endoscopy significant for esophagitis, possible candidiasis, follow on results, started on PPI and Fluconazole  Orthostatic hypotension/dizziness Resolved Likely due to nausea/vomiting, dehydration S/P IV fluids  AKI on CKD stage III Improved, back to baseline Baseline creatinine 1.5, on admission 2.29, improved with IV antibiotics. Likely prerenal S/P IV fluids, avoid nephrotoxic's Strict I's and O's  Iron deficiency/microcytic anemia -Recent work-up early September significant for iron deficiency anemia, she did receive iron as an outpatient 3 weeks ago, given  another dose of iron, hemoglobin 7.8, which is consistent with her baseline.   Hyponatremia Ongoing S/P IV fluid Daily BMP  Hypertension Stable Continue Coreg, hydralazine Hold Lasix, hydrochlorothiazide  Chronic diastolic HF/pulmonary HTN Currently euvolemic, monitor closely as pt received IVF, may need to re-start home lasix ECHO on 02/2018 showed EF of 60-65%, G2DD, PA peak pressure 49 mm Hg Monitor closely  DM type II Last A1c 5.9 on 05/2018, controlled SSI, Accu-Cheks Hold home metformin  Renal artery stenosis s/p stent placement Recent ultrasound showed patent stents Continue aspirin,  [facility] Plavix can be held, I will hold for another 24 hours, and resume if hemoglobin remains stable      Code Status: Full  Family Communication: Son at bedside  Disposition Plan: TBD once w/u complete   Consultants:  GI  Cardiology   Procedures:  Endoscopy 08/27/2018  Antimicrobials:  None  DVT prophylaxis: Heparin   Objective: Vitals:   08/27/18 0803 08/27/18 0906 08/27/18 0910 08/27/18 0920  BP:  (!) 133/40 (!) 133/40 (!) 180/67  Pulse: 75 (!) 36 76 63  Resp: (!) 22 (!) 25 (!) 23 17  Temp:  97.9 F (36.6 C)    TempSrc:  Oral    SpO2: 96% 100% 100% 97%  Weight:      Height:        Intake/Output Summary (Last 24 hours) at 08/27/2018 1626 Last data filed at 08/27/2018 0906 Gross per 24 hour  Intake 400 ml  Output -  Net 400 ml   Filed Weights   08/26/18 0423 08/26/18 0430 08/27/18 0802  Weight: 69.6 kg 68 kg 68 kg    Exam:  Awake Alert, Oriented X 3, No new F.N  deficits, Normal affect Symmetrical Chest wall movement, Good air movement bilaterally, CTAB RRR,No Gallops,Rubs or new Murmurs, No Parasternal Heave +ve B.Sounds, Abd Soft, has some mild diffuse abdominal tenderness, No rebound - guarding or rigidity. No Cyanosis, Clubbing or edema, No new Rash or bruise      Data Reviewed: CBC: Recent Labs  Lab 08/22/18 2319  08/24/18 0531 08/25/18 0530 08/26/18 0532 08/27/18 0335  WBC 10.0 5.1 5.1 4.8 4.5  NEUTROABS 7.4 3.4 3.4 3.1 2.8  HGB 9.3* 7.8* 7.8* 8.0* 7.9*  HCT 30.9* 26.0* 26.2* 26.6* 25.7*  MCV 77.6* 77.6* 77.3* 77.3* 76.5*  PLT 263 202 194 206 878   Basic Metabolic Panel: Recent Labs  Lab 08/23/18 0608 08/24/18 0531 08/25/18 0530 08/26/18 0532 08/27/18 0335  NA 132* 132* 134* 131* 134*  K 4.0 3.8 4.4 4.0 3.9  CL 102 104 108 105 105  CO2 22 21* 20* 19* 22  GLUCOSE 130* 102* 113* 161* 100*  BUN 22 16 12 9 9   CREATININE 2.03* 1.65* 1.45* 1.41* 1.52*  CALCIUM 8.4* 7.7* 7.7* 7.9* 8.2*   GFR: Estimated Creatinine Clearance: 27.1 mL/min (A) (by C-G formula based on SCr of 1.52 mg/dL (H)). Liver Function Tests: Recent Labs  Lab 08/22/18 2319  AST 50*  ALT 28  ALKPHOS 60  BILITOT 0.8  PROT 6.5  ALBUMIN 2.7*   Recent Labs  Lab 08/22/18 2319  LIPASE 35   No results for input(s): AMMONIA in the last 168 hours. Coagulation Profile: No results for input(s): INR, PROTIME in the last 168 hours. Cardiac Enzymes: No results for input(s): CKTOTAL, CKMB, CKMBINDEX, TROPONINI in the last 168 hours. BNP (last 3 results) No results for input(s): PROBNP in the last 8760 hours. HbA1C: No results for input(s): HGBA1C in the last 72 hours. CBG: Recent Labs  Lab 08/26/18 2034 08/27/18 0032 08/27/18 0451 08/27/18 0830 08/27/18 1235  GLUCAP 136* 129* 92 100* 137*   Lipid Profile: No results for input(s): CHOL, HDL, LDLCALC, TRIG, CHOLHDL, LDLDIRECT in the last 72 hours. Thyroid Function Tests: No results for input(s): TSH, T4TOTAL, FREET4, T3FREE, THYROIDAB in the last 72 hours. Anemia Panel: No results for input(s): VITAMINB12, FOLATE, FERRITIN, TIBC, IRON, RETICCTPCT in the last 72 hours. Urine analysis:    Component Value Date/Time   COLORURINE YELLOW 08/23/2018 1920   APPEARANCEUR CLEAR 08/23/2018 1920   LABSPEC 1.014 08/23/2018 1920   PHURINE 7.0 08/23/2018 1920   GLUCOSEU  50 (A) 08/23/2018 1920   HGBUR NEGATIVE 08/23/2018 1920   BILIRUBINUR NEGATIVE 08/23/2018 1920   BILIRUBINUR neg 07/18/2017 1118   KETONESUR NEGATIVE 08/23/2018 1920   PROTEINUR 100 (A) 08/23/2018 1920   UROBILINOGEN negative (A) 07/18/2017 1118   UROBILINOGEN 1.0 07/09/2014 0955   NITRITE NEGATIVE 08/23/2018 1920   LEUKOCYTESUR NEGATIVE 08/23/2018 1920   Sepsis Labs: @LABRCNTIP (procalcitonin:4,lacticidven:4)  )No results found for this or any previous visit (from the past 240 hour(s)).    Studies: Ct Angio Abd/pel W/ And/or W/o  Result Date: 08/27/2018 CLINICAL DATA:  Abdominal pain. Unintentional weight loss. Evaluate for mesenteric ischemia. EXAM: CTA ABDOMEN AND PELVIS WITH CONTRAST TECHNIQUE: Multidetector CT imaging of the abdomen and pelvis was performed using the standard protocol during bolus administration of intravenous contrast. Multiplanar reconstructed images and MIPs were obtained and reviewed to evaluate the vascular anatomy. CONTRAST:  11mL ISOVUE-370 IOPAMIDOL (ISOVUE-370) INJECTION 76% COMPARISON:  CT the chest, abdomen pelvis-05/28/2018; 01/19/2016; 07/28/2015 FINDINGS: VASCULAR Aorta: Aneurysmal dilatation of the imaged distal aspect of the descending thoracic aorta measuring  approximately 39 mm in diameter (image 1, series 12, similar to the 01/2016 examination. Large amount of irregular mixed calcified though predominantly noncalcified atherosclerotic plaque throughout the abdominal aorta, not definitely resulting in hemodynamically significant stenosis. Interval increase in size of aneurysmal dilatation of the infrarenal abdominal aorta measuring approximately 3.6 x 3.6 x 3.2 cm in greatest oblique short axis axial (image 57, series 6), coronal (image 59, series 10) and sagittal (image 99, series 9) dimensions respectively, previously, 2.8 x 3.5 x 3.2 cm. Grossly unchanged appearance of thrombosed approximately 1.3 x 0.7 cm penetrating atherosclerotic ulcer arising from  the medial aspect of the infrarenal abdominal aorta (image 53, series 6). Short-segment dissections within the distal aspect of the abdominal aorta (images 66, series 6 and image 70, series 6), similar to the 01/2016 examination and again not definitely resulting in a hemodynamically significant stenosis. No periaortic stranding. Celiac: There is a minimal amount of mixed calcified and noncalcified atherosclerotic plaque involving the origin the celiac artery, not resulting in a hemodynamically significant stenosis. Conventional branching pattern. SMA: Moderate to large amount of predominantly noncalcified atherosclerotic plaque involving the origin of the SMA resulting in at least 50% luminal narrowing (axial image 43, series 6; sagittal image 102, series 9). Conventional branching pattern. The distal tributaries of the SMA appear widely patent without discrete intraluminal filling defect to suggest distal embolism. Renals: Solitary bilaterally patient has undergone stenting of the origin of the bilateral renal arteries the patency of which is suboptimally evaluated secondary to streak artifact. The remainder the right renal artery appears patent without definitive hemodynamically significant stenosis or vessel irregularity to suggest FMD. The left renal artery appears somewhat atretic though supplies a slightly asymmetrically atrophic left kidney. IMA: Atretic though appears patent Inflow: There is a large amount of irregular mixed calcified and noncalcified atherosclerotic plaque involving the bilateral normal caliber common iliac arteries, not definitely resulting in a hemodynamically significant stenosis. Short-segment non flow limiting dissection within the right common iliac artery (image 79, series 6), similar to the 01/2016 examination. Suspected hemodynamically significant narrowings involving the origin of the bilateral common iliac arteries (coronal image 73, series 10). There is mild fusiform ectasia of  the left internal iliac artery measuring 1 cm in diameter (image 91, series 6), similar to the 01/2016 examination. The right internal iliac artery is diseased though of normal caliber. Proximal Outflow: Mixed calcified and noncalcified atherosclerotic plaque within the left common femoral artery (images 113 12/06/2013, series 6) approaches 50% luminal narrowing. Atherosclerotic plaque within the right common femoral artery, not definitely resulting in a hemodynamically significant stenosis. The imaged portions of the bilateral superficial femoral arteries appear patent without definitive hemodynamically significant narrowing. Veins: The IVC and pelvic venous system appear widely patent. Review of the MIP images confirms the above findings. NON-VASCULAR Lower chest: Limited visualization of the lower thorax demonstrates small bilateral effusions with associated bibasilar consolidative opacities, left greater than right. Cardiomegaly.  No pericardial effusion. Hepatobiliary: There is mild nodularity hepatic contour. There is a minimal amount of focal fatty infiltration adjacent to the fissure for ligamentum teres. No discrete hepatic lesions. Normal appearance of the gallbladder given degree distention. No intra extrahepatic bili duct dilatation. No ascites. Pancreas: Normal appearance of the pancreas. Spleen: Normal appearance of the spleen Adrenals/Urinary Tract: There is symmetric enhancement of the bilateral kidneys. The left kidney again remains atrophic in comparison to the right. No definite renal stones this postcontrast examination. No urine obstruction or perinephric stranding. No discrete renal lesions. Normal appearance  of the bilateral adrenal glands. Normal appearance of the urinary bladder given degree distention. Stomach/Bowel: Moderate colonic stool burden without evidence of enteric obstruction. Normal appearance of the terminal ileum. The appendix is not visualized, however there is no pericecal  inflammatory change. No pneumoperitoneum, pneumatosis or portal venous gas. Lymphatic: No bulky retroperitoneal, mesenteric, pelvic or inguinal lymphadenopathy. Reproductive: Normal appearance the prostate gland. No free fluid in the pelvic cul-de-sac. Other: Diffuse body wall anasarca. Musculoskeletal: No acute or aggressive osseous abnormalities. Moderate DDD of L5-S1 with disc space height loss, endplate irregularity and sclerosis. Moderate severe degenerative change the bilateral hips, left greater than right. IMPRESSION: VASCULAR 1. Interval increase in size of infrarenal abdominal aortic aneurysm, measuring 3.6 x 3.6 x 3.2 cm, previously, 2.8 x 3.5 x 3.2 cm. Aortic aneurysm NOS (ICD10-I71.9). 2. Large amount of irregular mixed calcified though prominently noncalcified atherosclerotic plaque throughout the abdominal aorta including non flow limiting short-segment dissection distal aspect of the abdominal aorta, similar to the 01/2016 examination. Aortic Atherosclerosis (ICD10-I70.0). 3. At least 50% luminal narrowing involving the origin the SMA however the celiac artery is patent without hemodynamically significant narrowing. The IMA is atretic though appears patent. 4. Suspected hemodynamically significant narrowings involving the origin of the bilateral external iliac arteries as well as the left common femoral artery. 5. Post stenting of the origin of the bilateral renal arteries. NON-VASCULAR 1. Moderate colonic stool burden without evidence of enteric obstruction. No discrete abnormal areas of bowel wall thickening, pneumatosis or portal venous gas. 2. Cardiomegaly with small bilateral effusions with associated bibasilar opacities, left greater than likely atelectasis. 3. Mild nodularity hepatic contour as could be seen in the setting early cirrhotic change. 4. Stable asymmetric atrophy of the left kidney in comparison right without evidence of urinary obstruction. Electronically Signed   By: Sandi Mariscal  M.D.   On: 08/27/2018 08:42    Scheduled Meds: . aspirin  81 mg Oral Daily  . carvedilol  12.5 mg Oral BID  . ezetimibe  10 mg Oral QHS  . feeding supplement  1 Container Oral TID BM  . fluconazole  100 mg Oral Daily  . hydrALAZINE  25 mg Oral BID  . insulin aspart  0-9 Units Subcutaneous Q4H  . latanoprost  1 drop Both Eyes QHS  . multivitamin with minerals  1 tablet Oral Daily  . pantoprazole  40 mg Oral BID  . rosuvastatin  40 mg Oral QHS    Continuous Infusions: . ferumoxytol       LOS: 3 days     Phillips Climes, MD [facility]   If 7PM-7AM, please contact night-coverage [email or web address]  08/27/2018, 4:26 PM

## 2018-08-27 NOTE — Anesthesia Postprocedure Evaluation (Signed)
Anesthesia Post Note  [name]  Procedure(s) Performed: ESOPHAGOGASTRODUODENOSCOPY (EGD) WITH PROPOFOL (N/A ) ESOPHAGEAL BRUSHING     Patient location during evaluation: PACU Anesthesia Type: MAC Level of consciousness: awake and alert Pain management: pain level controlled Vital Signs Assessment: post-procedure vital signs reviewed and stable Respiratory status: spontaneous breathing and respiratory function stable Cardiovascular status: stable Postop Assessment: no apparent nausea or vomiting Anesthetic complications: no    Last Vitals:  Vitals:   08/27/18 0910 08/27/18 0920  BP: (!) 133/40 (!) 180/67  Pulse: 76 63  Resp: (!) 23 17  Temp:    SpO2: 100% 97%    Last Pain:  Vitals:   08/27/18 0906  TempSrc: Oral  PainSc:                  Isadore Bokhari DANIEL

## 2018-08-27 NOTE — Op Note (Signed)
[facility] Patient Name: Kendra Todd Procedure Date : 08/27/2018 MRN: [id number] Attending MD: Mauri Pole , MD Date of Birth: Mar 22, 1941 CSN: [id number] Age: 77 Admit Type: Inpatient Procedure:                Upper GI endoscopy Indications:              Abdominal pain in the left upper quadrant Providers:                Mauri Pole, MD, Cleda Daub, RN, Alan Mulder, Technician, Kerrie Pleasure, CRNA Referring MD:              Medicines:                Monitored Anesthesia Care Complications:            No immediate complications. Estimated Blood Loss:     Estimated blood loss: none. Procedure:                Pre-Anesthesia Assessment:                           - Prior to the procedure, a History and Physical                            was performed, and patient medications and                            allergies were reviewed. The patient's tolerance of                            previous anesthesia was also reviewed. The risks                            and benefits of the procedure and the sedation                            options and risks were discussed with the patient.                            All questions were answered, and informed consent                            was obtained. Prior Anticoagulants: The patient                            last took Plavix (clopidogrel) on the day of the                            procedure. ASA Grade Assessment: III - A patient                            with severe systemic disease. After reviewing the  risks and benefits, the patient was deemed in                            satisfactory condition to undergo the procedure.                           After obtaining informed consent, the endoscope was                            passed under direct vision. Throughout the                            procedure, the patient's blood pressure, pulse, and                          oxygen saturations were monitored continuously. The                            GIF-H190 ([id number]) Olympus Adult EGD was introduced                            through the mouth, and advanced to the second part                            of duodenum. The upper GI endoscopy was                            accomplished without difficulty. The patient                            tolerated the procedure well. Scope In: Scope Out: Findings:      LA Grade C (one or more mucosal breaks continuous between tops of 2 or       more mucosal folds, less than 75% circumference) esophagitis with no       bleeding was found 36 to 38 cm from the incisors.      Patchy, white plaques were found in the entire esophagus. Brushings for       KOH prep were obtained in the entire esophagus.      The Z-line was regular and was found 38 cm from the incisors.      The stomach was normal.      The examined duodenum was normal. Impression:               - LA Grade C erosive esophagitis.                           - Esophageal plaques were found, suspicious for                            candidiasis. Brushings performed.                           - Z-line regular, 38 cm from the incisors.                           -  Normal stomach.                           - Normal examined duodenum. Recommendation:           - Patient has a contact number available for                            emergencies. The signs and symptoms of potential                            delayed complications were discussed with the                            patient. Return to normal activities tomorrow.                            Written discharge instructions were provided to the                            patient.                           - Resume previous diet.                           - Continue present medications.                           - Await pathology results.                           - Use Protonix  (pantoprazole) 40 mg PO BID.                           - Follow an antireflux regimen.                           - Diflucan (fluconazole) 100 mg PO daily for 7 days.                           - Follow CT angio Procedure Code(s):        --- Professional ---                           [phone], Esophagogastroduodenoscopy, flexible,                            transoral; diagnostic, including collection of                            specimen(s) by brushing or washing, when performed                            (separate procedure) Diagnosis Code(s):        --- Professional ---  K20.8, Other esophagitis                           K22.9, Disease of esophagus, unspecified                           R10.12, Left upper quadrant pain CPT copyright 2018 [employer]. All rights reserved. The codes documented in this report are preliminary and upon coder review may  be revised to meet current compliance requirements. Mauri Pole, MD 08/27/2018 9:08:41 AM This report has been signed electronically. Number of Addenda: 0

## 2018-08-27 NOTE — Interval H&P Note (Signed)
History and Physical Interval Note:  08/27/2018 8:25 AM  Kendra Todd  has presented today for surgery, with the diagnosis of anemia, left abd pain  The various methods of treatment have been discussed with the patient and family. After consideration of risks, benefits and other options for treatment, the patient has consented to  Procedure(s): ESOPHAGOGASTRODUODENOSCOPY (EGD) WITH PROPOFOL (N/A) as a surgical intervention .  The patient's history has been reviewed, patient examined, no change in status, stable for surgery.  I have reviewed the patient's chart and labs.  Questions were answered to the patient's satisfaction.     [name]

## 2018-08-27 NOTE — Transfer of Care (Signed)
Immediate Anesthesia Transfer of Care Note  [name]  Procedure(s) Performed: ESOPHAGOGASTRODUODENOSCOPY (EGD) WITH PROPOFOL (N/A ) ESOPHAGEAL BRUSHING  Patient Location: Endoscopy Unit  Anesthesia Type:MAC  Level of Consciousness: awake and patient cooperative  Airway & Oxygen Therapy: Patient Spontanous Breathing and Patient connected to nasal cannula oxygen  Post-op Assessment: Report given to RN, Post -op Vital signs reviewed and stable and Patient moving all extremities X 4  Post vital signs: Reviewed and stable  Last Vitals:  Vitals Value Taken Time  BP    Temp    Pulse    Resp    SpO2      Last Pain:  Vitals:   08/27/18 0802  TempSrc: Oral  PainSc: 6       Patients Stated Pain Goal: 2 (76/16/07 3710)  Complications: No apparent anesthesia complications

## 2018-08-27 NOTE — Anesthesia Preprocedure Evaluation (Addendum)
Anesthesia Evaluation  Patient identified by MRN, date of birth, ID band Patient awake    Reviewed: Allergy & Precautions, NPO status , Patient's Chart, lab work & pertinent test results, reviewed documented beta blocker date and time   Airway Mallampati: II  TM Distance: >3 FB Neck ROM: Full    Dental no notable dental hx. (+) Dental Advisory Given, Edentulous Upper, Edentulous Lower   Pulmonary neg pulmonary ROS, former smoker,    Pulmonary exam normal        Cardiovascular hypertension, Pt. on medications and Pt. on home beta blockers + CAD, + Past MI and + CABG  Normal cardiovascular exam  Study Conclusions  - Left ventricle: The cavity size was normal. Wall thickness was   increased in a pattern of moderate LVH. Systolic function was   normal. The estimated ejection fraction was in the range of 60%   to 65%. Wall motion was normal; there were no regional wall   motion abnormalities. Features are consistent with a pseudonormal   left ventricular filling pattern, with concomitant abnormal   relaxation and increased filling pressure (grade 2 diastolic   dysfunction). - Aortic valve: Moderately calcified annulus. - Left atrium: The atrium was severely dilated. - Tricuspid valve: There was mild-moderate regurgitation. - Pulmonary arteries: Systolic pressure was moderately increased.   PA peak pressure: 49 mm Hg (S).   Neuro/Psych negative neurological ROS  negative psych ROS   GI/Hepatic negative GI ROS, Neg liver ROS,   Endo/Other  diabetes, Type 2, Oral Hypoglycemic Agents  Renal/GU Renal disease  negative genitourinary   Musculoskeletal negative musculoskeletal ROS (+)   Abdominal   Peds negative pediatric ROS (+)  Hematology negative hematology ROS (+) anemia ,   Anesthesia Other Findings   Reproductive/Obstetrics negative OB ROS                            Anesthesia  Physical Anesthesia Plan  ASA: III  Anesthesia Plan: MAC   Post-op Pain Management:    Induction: Intravenous  PONV Risk Score and Plan: 2 and Ondansetron and Propofol infusion  Airway Management Planned: Natural Airway  Additional Equipment:   Intra-op Plan:   Post-operative Plan:   Informed Consent: I have reviewed the patients History and Physical, chart, labs and discussed the procedure including the risks, benefits and alternatives for the proposed anesthesia with the patient or authorized representative who has indicated his/her understanding and acceptance.   Dental advisory given  Plan Discussed with: CRNA, Anesthesiologist and Surgeon  Anesthesia Plan Comments:        Anesthesia Quick Evaluation

## 2018-08-28 ENCOUNTER — Encounter (HOSPITAL_COMMUNITY): Payer: Self-pay | Admitting: Gastroenterology

## 2018-08-28 LAB — CBC WITH DIFFERENTIAL/PLATELET
ABS IMMATURE GRANULOCYTES: 0.02 10*3/uL (ref 0.00–0.07)
BASOS PCT: 1 %
Basophils Absolute: 0 10*3/uL (ref 0.0–0.1)
Eosinophils Absolute: 0.2 10*3/uL (ref 0.0–0.5)
Eosinophils Relative: 4 %
HEMATOCRIT: 25.3 % — AB (ref 36.0–46.0)
HEMOGLOBIN: 7.8 g/dL — AB (ref 12.0–15.0)
IMMATURE GRANULOCYTES: 0 %
LYMPHS ABS: 1 10*3/uL (ref 0.7–4.0)
LYMPHS PCT: 22 %
MCH: 23.4 pg — AB (ref 26.0–34.0)
MCHC: 30.8 g/dL (ref 30.0–36.0)
MCV: 75.7 fL — AB (ref 80.0–100.0)
MONO ABS: 0.7 10*3/uL (ref 0.1–1.0)
MONOS PCT: 15 %
NEUTROS ABS: 2.7 10*3/uL (ref 1.7–7.7)
NEUTROS PCT: 58 %
Platelets: 220 10*3/uL (ref 150–400)
RBC: 3.34 MIL/uL — ABNORMAL LOW (ref 3.87–5.11)
RDW: 18 % — ABNORMAL HIGH (ref 11.5–15.5)
WBC: 4.6 10*3/uL (ref 4.0–10.5)
nRBC: 0 % (ref 0.0–0.2)

## 2018-08-28 LAB — BASIC METABOLIC PANEL
ANION GAP: 3 — AB (ref 5–15)
BUN: 8 mg/dL (ref 8–23)
CALCIUM: 8.3 mg/dL — AB (ref 8.9–10.3)
CHLORIDE: 105 mmol/L (ref 98–111)
CO2: 25 mmol/L (ref 22–32)
Creatinine, Ser: 1.49 mg/dL — ABNORMAL HIGH (ref 0.44–1.00)
GFR calc Af Amer: 37 mL/min — ABNORMAL LOW (ref >60)
GFR, EST NON AFRICAN AMERICAN: 32 mL/min — AB (ref >60)
GLUCOSE: 104 mg/dL — AB (ref 70–99)
POTASSIUM: 3.8 mmol/L (ref 3.5–5.1)
Sodium: 133 mmol/L — ABNORMAL LOW (ref 135–145)

## 2018-08-28 LAB — GLUCOSE, CAPILLARY
GLUCOSE-CAPILLARY: 107 mg/dL — AB (ref 70–99)
GLUCOSE-CAPILLARY: 129 mg/dL — AB (ref 70–99)
Glucose-Capillary: 100 mg/dL — ABNORMAL HIGH (ref 70–99)
Glucose-Capillary: 134 mg/dL — ABNORMAL HIGH (ref 70–99)
Glucose-Capillary: 120 mg/dL — ABNORMAL HIGH (ref 70–99)

## 2018-08-28 MED ORDER — HEPARIN SODIUM (PORCINE) 5000 UNIT/ML IJ SOLN
5000.0000 [IU] | Freq: Three times a day (TID) | INTRAMUSCULAR | Status: DC
  Administered 2018-08-28 – 2018-08-30 (×5): 5000 [IU] via SUBCUTANEOUS
  Filled 2018-08-28 (×6): qty 1

## 2018-08-28 MED ORDER — POLYETHYLENE GLYCOL 3350 17 G PO PACK
17.0000 g | PACK | Freq: Two times a day (BID) | ORAL | Status: DC
  Administered 2018-08-28 – 2018-08-30 (×4): 17 g via ORAL
  Filled 2018-08-28 (×4): qty 1

## 2018-08-28 MED ORDER — DOCUSATE SODIUM 100 MG PO CAPS
100.0000 mg | ORAL_CAPSULE | Freq: Two times a day (BID) | ORAL | Status: DC
  Administered 2018-08-28 – 2018-08-30 (×4): 100 mg via ORAL
  Filled 2018-08-28 (×4): qty 1

## 2018-08-28 MED ORDER — BISACODYL 5 MG PO TBEC
20.0000 mg | DELAYED_RELEASE_TABLET | Freq: Once | ORAL | Status: AC
  Administered 2018-08-28: 20 mg via ORAL
  Filled 2018-08-28: qty 4

## 2018-08-28 MED ORDER — POLYETHYLENE GLYCOL 3350 17 G PO PACK
34.0000 g | PACK | Freq: Once | ORAL | Status: AC
  Administered 2018-08-28: 34 g via ORAL
  Filled 2018-08-28: qty 2

## 2018-08-28 MED ORDER — MAGNESIUM CITRATE PO SOLN
1.0000 | Freq: Once | ORAL | Status: AC
  Administered 2018-08-28: 1 via ORAL
  Filled 2018-08-28: qty 296

## 2018-08-28 MED ORDER — GLYCERIN (LAXATIVE) 2.1 G RE SUPP
1.0000 | Freq: Every day | RECTAL | Status: DC | PRN
  Filled 2018-08-28: qty 1

## 2018-08-28 MED ORDER — SORBITOL 70 % SOLN
960.0000 mL | TOPICAL_OIL | Freq: Once | ORAL | Status: DC
  Filled 2018-08-28 (×2): qty 473

## 2018-08-28 NOTE — Care Management Important Message (Signed)
Important Message  Patient Details  Name: Kendra Todd MRN: [id number] Date of Birth: 08/27/1941   Medicare Important Message Given:  Yes    Owens Hara 08/28/2018, 11:58 AM

## 2018-08-28 NOTE — Progress Notes (Signed)
PROGRESS NOTE  Kendra Todd VPX:[id number] DOB: [birth year] DOA: 08/22/2018 PCP: [name], DO  Brief narrative 77 y.o. female with medical history significant of RAS, CAD, HLD, PAF, DM2, HTN, Stroke, AAA, CKD, with recent bilateral renal artery stents placement by Dr. Gwenlyn Found,  presents to the ED with c/o abdominal pain, n/v, weakness/dizziness for the past few days.  She was noted to have hypotension, .  Subjective -No significant events overnight, denies any complaints other than her abdominal pain, with no significant change .  Assessment/Plan: Principal Problem:   Nausea & vomiting Active Problems:   CAD (coronary artery disease): 1993 PTCA to circumflex.  2002 2 BMS stents to ostial, S/P CABG x 3 12/04/12   Bilateral renal artery stenosis (HCC)   Renovascular hypertension   Labile blood pressure   LLQ abdominal pain   Orthostatic hypotension   H. pylori infection   Abdominal pain  Left-sided abdominal pain  -Unclear etiology, started after bilateral renal artery stent placement, unclear if still enema is contributing he in the setting with improving blood pressure after stent placement, and significant anemia -CTA abdomen significant for at least 50% luminal narrowing of the origin of SMA, but celiac artery is patent, IMA is atretic, though appears patent . -GI input greatly appreciated, endoscopy significant for esophagitis, possible candidiasis, follow on results, started on PPI and Fluconazole -Awaiting further recommendation -Patient with significant stool burden on imaging with no bowel movement for last 9 days, no improvement in MiraLAX, will give her smog enema and magnesium citrate  Orthostatic hypotension/dizziness Resolved Likely due to nausea/vomiting, dehydration S/P IV fluids  AKI on CKD stage III Improved, back to baseline Baseline creatinine 1.5, on admission 2.29, improved with IV antibiotics. Likely prerenal S/P IV fluids, avoid nephrotoxic's Strict  I's and O's  Iron deficiency/microcytic anemia -Recent work-up early September significant for iron deficiency anemia, she did receive iron as an outpatient 3 weeks ago, given another dose of iron, hemoglobin 7.8, which is consistent with her baseline.   Hyponatremia Ongoing S/P IV fluid Daily BMP  Hypertension Stable Continue Coreg, hydralazine Hold Lasix, hydrochlorothiazide  Chronic diastolic HF/pulmonary HTN Currently euvolemic, monitor closely as pt received IVF, may need to re-start home lasix ECHO on 02/2018 showed EF of 60-65%, G2DD, PA peak pressure 49 mm Hg Monitor closely  DM type II Last A1c 5.9 on 05/2018, controlled SSI, Accu-Cheks Hold home metformin  Renal artery stenosis s/p stent placement Recent ultrasound showed patent stents Continue aspirin,  Statins Cardiology Plavix can be held, I will hold for another 24 hours, and resume if hemoglobin remains stable      Code Status: Full  Family Communication: Son at bedside  Disposition Plan: TBD once w/u complete   Consultants:  GI  Cardiology   Procedures:  Endoscopy 08/27/2018  Antimicrobials:  None  DVT prophylaxis: Heparin   Objective: Vitals:   08/27/18 0920 08/27/18 2010 08/28/18 0535 08/28/18 1106  BP: (!) 180/67 (!) 159/69 (!) 157/76 (!) 125/56  Pulse: 63 77 66   Resp: 17 16 16    Temp:  98.6 F (37 C) 97.8 F (36.6 C)   TempSrc:  Oral Oral   SpO2: 97% 95% 96%   Weight:      Height:        Intake/Output Summary (Last 24 hours) at 08/28/2018 1642 Last data filed at 08/28/2018 0600 Gross per 24 hour  Intake 117 ml  Output -  Net 117 ml   Filed Weights   08/26/18  0423 08/26/18 0430 08/27/18 0802  Weight: 69.6 kg 68 kg 68 kg    Exam:  Awake Alert, Oriented X 3, No new F.N deficits, Normal affect Symmetrical Chest wall movement, Good air movement bilaterally, CTAB RRR,No Gallops,Rubs or new Murmurs, No Parasternal Heave +ve B.Sounds, Abd Soft, has mild diffuse  tenderness, No rebound - guarding or rigidity. No Cyanosis, Clubbing or edema, No new Rash or bruise      Data Reviewed: CBC: Recent Labs  Lab 08/24/18 0531 08/25/18 0530 08/26/18 0532 08/27/18 0335 08/28/18 0343  WBC 5.1 5.1 4.8 4.5 4.6  NEUTROABS 3.4 3.4 3.1 2.8 2.7  HGB 7.8* 7.8* 8.0* 7.9* 7.8*  HCT 26.0* 26.2* 26.6* 25.7* 25.3*  MCV 77.6* 77.3* 77.3* 76.5* 75.7*  PLT 202 194 206 212 950   Basic Metabolic Panel: Recent Labs  Lab 08/24/18 0531 08/25/18 0530 08/26/18 0532 08/27/18 0335 08/28/18 0343  NA 132* 134* 131* 134* 133*  K 3.8 4.4 4.0 3.9 3.8  CL 104 108 105 105 105  CO2 21* 20* 19* 22 25  GLUCOSE 102* 113* 161* 100* 104*  BUN 16 12 9 9 8   CREATININE 1.65* 1.45* 1.41* 1.52* 1.49*  CALCIUM 7.7* 7.7* 7.9* 8.2* 8.3*   GFR: Estimated Creatinine Clearance: 27.7 mL/min (A) (by C-G formula based on SCr of 1.49 mg/dL (H)). Liver Function Tests: Recent Labs  Lab 08/22/18 2319  AST 50*  ALT 28  ALKPHOS 60  BILITOT 0.8  PROT 6.5  ALBUMIN 2.7*   Recent Labs  Lab 08/22/18 2319  LIPASE 35   No results for input(s): AMMONIA in the last 168 hours. Coagulation Profile: No results for input(s): INR, PROTIME in the last 168 hours. Cardiac Enzymes: No results for input(s): CKTOTAL, CKMB, CKMBINDEX, TROPONINI in the last 168 hours. BNP (last 3 results) No results for input(s): PROBNP in the last 8760 hours. HbA1C: No results for input(s): HGBA1C in the last 72 hours. CBG: Recent Labs  Lab 08/27/18 1235 08/27/18 1738 08/27/18 2011 08/28/18 0802 08/28/18 1217  GLUCAP 137* 120* 136* 100* 134*   Lipid Profile: No results for input(s): CHOL, HDL, LDLCALC, TRIG, CHOLHDL, LDLDIRECT in the last 72 hours. Thyroid Function Tests: No results for input(s): TSH, T4TOTAL, FREET4, T3FREE, THYROIDAB in the last 72 hours. Anemia Panel: No results for input(s): VITAMINB12, FOLATE, FERRITIN, TIBC, IRON, RETICCTPCT in the last 72 hours. Urine analysis:    Component  Value Date/Time   COLORURINE YELLOW 08/23/2018 1920   APPEARANCEUR CLEAR 08/23/2018 1920   LABSPEC 1.014 08/23/2018 1920   PHURINE 7.0 08/23/2018 1920   GLUCOSEU 50 (A) 08/23/2018 1920   HGBUR NEGATIVE 08/23/2018 1920   BILIRUBINUR NEGATIVE 08/23/2018 1920   BILIRUBINUR neg 07/18/2017 1118   KETONESUR NEGATIVE 08/23/2018 1920   PROTEINUR 100 (A) 08/23/2018 1920   UROBILINOGEN negative (A) 07/18/2017 1118   UROBILINOGEN 1.0 07/09/2014 0955   NITRITE NEGATIVE 08/23/2018 1920   LEUKOCYTESUR NEGATIVE 08/23/2018 1920   Sepsis Labs: @LABRCNTIP (procalcitonin:4,lacticidven:4)  )No results found for this or any previous visit (from the past 240 hour(s)).    Studies: No results found.  Scheduled Meds: . aspirin  81 mg Oral Daily  . carvedilol  12.5 mg Oral BID  . docusate sodium  100 mg Oral BID  . ezetimibe  10 mg Oral QHS  . feeding supplement  1 Container Oral TID BM  . fluconazole  100 mg Oral Daily  . hydrALAZINE  25 mg Oral BID  . insulin aspart  0-9 Units Subcutaneous Q4H  .  latanoprost  1 drop Both Eyes QHS  . multivitamin with minerals  1 tablet Oral Daily  . pantoprazole  40 mg Oral BID  . polyethylene glycol  17 g Oral BID  . rosuvastatin  40 mg Oral QHS    Continuous Infusions:    LOS: 4 days     Phillips Climes, MD [facility]   If 7PM-7AM, please contact night-coverage [email or web address]  08/28/2018, 4:42 PM

## 2018-08-28 NOTE — Progress Notes (Signed)
Daily Rounding Note  08/28/2018, 1:15 PM  LOS: 4 days   SUBJECTIVE:   Chief complaint:    abd pain Pain intermittent, not worsened by po.  No nausea. No BM for 1 week.  Drinking gatorade/miralax now.  However did not have response to miralax/gatorade 32 oz on 10/22 or miralax yesterday.    OBJECTIVE:         Vital signs in last 24 hours:    Temp:  [97.8 F (36.6 C)-98.6 F (37 C)] 97.8 F (36.6 C) (10/24 0535) Pulse Rate:  [66-77] 66 (10/24 0535) Resp:  [16] 16 (10/24 0535) BP: (125-159)/(56-76) 125/56 (10/24 1106) SpO2:  [95 %-96 %] 96 % (10/24 0535) Last BM Date: 08/22/18 Filed Weights   08/26/18 0423 08/26/18 0430 08/27/18 0802  Weight: 69.6 kg 68 kg 68 kg   General: Looks pale, sallow, chronically ill.  Sitting up in bed comfortable. Heart: RRR. Chest: Clear bilaterally.  No labored breathing. Abdomen: Mild, non-focal tenderness.  Active bowel sounds.  No distention. Extremities: No CCE. Neuro/Psych: Alert.  Oriented x3.  Gross deficits.  Intake/Output from previous day: 10/23 0701 - 10/24 0700 In: 877 [P.O.:360; I.V.:400; IV Piggyback:117] Out: -   Intake/Output this shift: No intake/output data recorded.  Lab Results: Recent Labs    08/26/18 0532 08/27/18 0335 08/28/18 0343  WBC 4.8 4.5 4.6  HGB 8.0* 7.9* 7.8*  HCT 26.6* 25.7* 25.3*  PLT 206 212 220   BMET Recent Labs    08/26/18 0532 08/27/18 0335 08/28/18 0343  NA 131* 134* 133*  K 4.0 3.9 3.8  CL 105 105 105  CO2 19* 22 25  GLUCOSE 161* 100* 104*  BUN 9 9 8   CREATININE 1.41* 1.52* 1.49*  CALCIUM 7.9* 8.2* 8.3*   LFT No results for input(s): PROT, ALBUMIN, AST, ALT, ALKPHOS, BILITOT, BILIDIR, IBILI in the last 72 hours. PT/INR No results for input(s): LABPROT, INR in the last 72 hours. Hepatitis Panel No results for input(s): HEPBSAG, HCVAB, HEPAIGM, HEPBIGM in the last 72 hours.  Studies/Results: Ct Angio Abd/pel W/ And/or  W/o  Result Date: 08/27/2018 CLINICAL DATA:  Abdominal pain. Unintentional weight loss. Evaluate for mesenteric ischemia. EXAM: CTA ABDOMEN AND PELVIS WITH CONTRAST TECHNIQUE: Multidetector CT imaging of the abdomen and pelvis was performed using the standard protocol during bolus administration of intravenous contrast. Multiplanar reconstructed images and MIPs were obtained and reviewed to evaluate the vascular anatomy. CONTRAST:  25mL ISOVUE-370 IOPAMIDOL (ISOVUE-370) INJECTION 76% COMPARISON:  CT the chest, abdomen pelvis-05/28/2018; 01/19/2016; 07/28/2015 FINDINGS: VASCULAR Aorta: Aneurysmal dilatation of the imaged distal aspect of the descending thoracic aorta measuring approximately 39 mm in diameter (image 1, series 12, similar to the 01/2016 examination. Large amount of irregular mixed calcified though predominantly noncalcified atherosclerotic plaque throughout the abdominal aorta, not definitely resulting in hemodynamically significant stenosis. Interval increase in size of aneurysmal dilatation of the infrarenal abdominal aorta measuring approximately 3.6 x 3.6 x 3.2 cm in greatest oblique short axis axial (image 57, series 6), coronal (image 59, series 10) and sagittal (image 99, series 9) dimensions respectively, previously, 2.8 x 3.5 x 3.2 cm. Grossly unchanged appearance of thrombosed approximately 1.3 x 0.7 cm penetrating atherosclerotic ulcer arising from the medial aspect of the infrarenal abdominal aorta (image 53, series 6). Short-segment dissections within the distal aspect of the abdominal aorta (images 66, series 6 and image 70, series 6), similar to the 01/2016 examination and again not definitely resulting in a  hemodynamically significant stenosis. No periaortic stranding. Celiac: There is a minimal amount of mixed calcified and noncalcified atherosclerotic plaque involving the origin the celiac artery, not resulting in a hemodynamically significant stenosis. Conventional branching  pattern. SMA: Moderate to large amount of predominantly noncalcified atherosclerotic plaque involving the origin of the SMA resulting in at least 50% luminal narrowing (axial image 43, series 6; sagittal image 102, series 9). Conventional branching pattern. The distal tributaries of the SMA appear widely patent without discrete intraluminal filling defect to suggest distal embolism. Renals: Solitary bilaterally patient has undergone stenting of the origin of the bilateral renal arteries the patency of which is suboptimally evaluated secondary to streak artifact. The remainder the right renal artery appears patent without definitive hemodynamically significant stenosis or vessel irregularity to suggest FMD. The left renal artery appears somewhat atretic though supplies a slightly asymmetrically atrophic left kidney. IMA: Atretic though appears patent Inflow: There is a large amount of irregular mixed calcified and noncalcified atherosclerotic plaque involving the bilateral normal caliber common iliac arteries, not definitely resulting in a hemodynamically significant stenosis. Short-segment non flow limiting dissection within the right common iliac artery (image 79, series 6), similar to the 01/2016 examination. Suspected hemodynamically significant narrowings involving the origin of the bilateral common iliac arteries (coronal image 73, series 10). There is mild fusiform ectasia of the left internal iliac artery measuring 1 cm in diameter (image 91, series 6), similar to the 01/2016 examination. The right internal iliac artery is diseased though of normal caliber. Proximal Outflow: Mixed calcified and noncalcified atherosclerotic plaque within the left common femoral artery (images 113 12/06/2013, series 6) approaches 50% luminal narrowing. Atherosclerotic plaque within the right common femoral artery, not definitely resulting in a hemodynamically significant stenosis. The imaged portions of the bilateral  superficial femoral arteries appear patent without definitive hemodynamically significant narrowing. Veins: The IVC and pelvic venous system appear widely patent. Review of the MIP images confirms the above findings. NON-VASCULAR Lower chest: Limited visualization of the lower thorax demonstrates small bilateral effusions with associated bibasilar consolidative opacities, left greater than right. Cardiomegaly.  No pericardial effusion. Hepatobiliary: There is mild nodularity hepatic contour. There is a minimal amount of focal fatty infiltration adjacent to the fissure for ligamentum teres. No discrete hepatic lesions. Normal appearance of the gallbladder given degree distention. No intra extrahepatic bili duct dilatation. No ascites. Pancreas: Normal appearance of the pancreas. Spleen: Normal appearance of the spleen Adrenals/Urinary Tract: There is symmetric enhancement of the bilateral kidneys. The left kidney again remains atrophic in comparison to the right. No definite renal stones this postcontrast examination. No urine obstruction or perinephric stranding. No discrete renal lesions. Normal appearance of the bilateral adrenal glands. Normal appearance of the urinary bladder given degree distention. Stomach/Bowel: Moderate colonic stool burden without evidence of enteric obstruction. Normal appearance of the terminal ileum. The appendix is not visualized, however there is no pericecal inflammatory change. No pneumoperitoneum, pneumatosis or portal venous gas. Lymphatic: No bulky retroperitoneal, mesenteric, pelvic or inguinal lymphadenopathy. Reproductive: Normal appearance the prostate gland. No free fluid in the pelvic cul-de-sac. Other: Diffuse body wall anasarca. Musculoskeletal: No acute or aggressive osseous abnormalities. Moderate DDD of L5-S1 with disc space height loss, endplate irregularity and sclerosis. Moderate severe degenerative change the bilateral hips, left greater than right. IMPRESSION:  VASCULAR 1. Interval increase in size of infrarenal abdominal aortic aneurysm, measuring 3.6 x 3.6 x 3.2 cm, previously, 2.8 x 3.5 x 3.2 cm. Aortic aneurysm NOS (ICD10-I71.9). 2. Large amount of irregular mixed  calcified though prominently noncalcified atherosclerotic plaque throughout the abdominal aorta including non flow limiting short-segment dissection distal aspect of the abdominal aorta, similar to the 01/2016 examination. Aortic Atherosclerosis (ICD10-I70.0). 3. At least 50% luminal narrowing involving the origin the SMA however the celiac artery is patent without hemodynamically significant narrowing. The IMA is atretic though appears patent. 4. Suspected hemodynamically significant narrowings involving the origin of the bilateral external iliac arteries as well as the left common femoral artery. 5. Post stenting of the origin of the bilateral renal arteries. NON-VASCULAR 1. Moderate colonic stool burden without evidence of enteric obstruction. No discrete abnormal areas of bowel wall thickening, pneumatosis or portal venous gas. 2. Cardiomegaly with small bilateral effusions with associated bibasilar opacities, left greater than likely atelectasis. 3. Mild nodularity hepatic contour as could be seen in the setting early cirrhotic change. 4. Stable asymmetric atrophy of the left kidney in comparison right without evidence of urinary obstruction. Electronically Signed   By: Sandi Mariscal M.D.   On: 08/27/2018 08:42    Scheduled Meds: . aspirin  81 mg Oral Daily  . carvedilol  12.5 mg Oral BID  . docusate sodium  100 mg Oral BID  . ezetimibe  10 mg Oral QHS  . feeding supplement  1 Container Oral TID BM  . fluconazole  100 mg Oral Daily  . hydrALAZINE  25 mg Oral BID  . insulin aspart  0-9 Units Subcutaneous Q4H  . latanoprost  1 drop Both Eyes QHS  . multivitamin with minerals  1 tablet Oral Daily  . pantoprazole  40 mg Oral BID  . polyethylene glycol  17 g Oral BID  . rosuvastatin  40 mg Oral  QHS   Continuous Infusions: PRN Meds:.acetaminophen **OR** acetaminophen, dicyclomine, Glycerin (Adult), ondansetron **OR** ondansetron (ZOFRAN) IV, traMADol   ASSESMENT:   *   LLQ pain. Occurring after placing renal artery stent 07/21/18.  ? Steal syndrome.    EGD 08/27/18:  LA Grade C erosive esophagitis. ? Esophageal candida, brushings pending. Stomach and duodenum normal.    CTA abd/pelvis: Increased size of infrarenal AAA plaque throughout the abdominal aorta with nonflow limiting short segment dissection in the distal abdominal aorta.  ~ 50% narrowing at the SMA origin but celiac patent and has no significant narrowing.  IMA atresion but patent.  Suspect hemodynamically significant narrowing in the iliac arteries and left CFA.  Previous bilateral renal artery stents. If she truly had steal syndrome and hypo-perfusion of the mesenteric vessels, would expect postprandial exacerbation of pain but this is not the case.  *    IDA.  Recent Feraheme infusion. Hgb low but stable.   Pt did not want to pursue Colonoscopy or EGD at 08/05/18 office visit.  Has since had EGD as above.     *   H Pylori + stool antigen, treated with biaxin/amoxil/.  Suspect the meds caused n/v, constipation for > 1 week PTA and these have been stopped.     *   A fib.    PLAN   *   Protonix 40 mg po BID.  Diflucan 100 mg q day for 7 days.   Added 20 mg of Dulcolax.  Continue with the parallax/Gatorade.   Azucena Freed  08/28/2018, 1:15 PM Phone [phone]

## 2018-08-28 NOTE — Progress Notes (Signed)
Physical Therapy Treatment Patient Details Name: Kendra Todd MRN: [id number] DOB: 1941/01/05 Today's Date: 08/28/2018    History of Present Illness 77 yo female with onset of poor appetite, dizziness and weakness was admitted with recent history of B renal artery stenting.  Transfusions of iron and ABT, stopped as not helpful.  PMHx:  abd pain, URI, BPPV, CAD, RAD, PAF, DM, HTN, stroke, endarterectomy, goiter, back surgeries, abnormal liver function,     PT Comments    Pt slow and guarded but moving quite well.  Plan for stair training next session HHPT remains appropriate.     Follow Up Recommendations  Home health PT     Equipment Recommendations  Rolling walker with 5" wheels    Recommendations for Other Services       Precautions / Restrictions Precautions Precautions: Fall Precaution Comments: prefers to walk with RW Restrictions Weight Bearing Restrictions: No    Mobility  Bed Mobility Overal bed mobility: Needs Assistance Bed Mobility: Supine to Sit;Sit to Supine     Supine to sit: Min assist Sit to supine: Min assist      Transfers Overall transfer level: Needs assistance Equipment used: Rolling walker (2 wheeled) Transfers: Sit to/from Stand Sit to Stand: Min assist         General transfer comment: Cues for hand placement to and from seated surface.   Ambulation/Gait Ambulation/Gait assistance: Min guard Gait Distance (Feet): 150 Feet Assistive device: Rolling walker (2 wheeled) Gait Pattern/deviations: Step-through pattern;Decreased stride length;Trunk flexed     General Gait Details: Cues for upper trunk control and RW safety.    Stairs             Wheelchair Mobility    Modified Rankin (Stroke Patients Only)       Balance Overall balance assessment: Needs assistance   Sitting balance-Leahy Scale: Fair       Standing balance-Leahy Scale: Poor                              Cognition Arousal/Alertness:  Awake/alert Behavior During Therapy: WFL for tasks assessed/performed Overall Cognitive Status: Within Functional Limits for tasks assessed                                        Exercises General Exercises - Lower Extremity Ankle Circles/Pumps: AROM;Both;10 reps;Supine Quad Sets: AROM;Both;10 reps;Supine Short Arc Quad: AROM;Both;10 reps;Supine Heel Slides: AROM;Both;10 reps;Supine    General Comments        Pertinent Vitals/Pain Pain Assessment: No/denies pain    Home Living                      Prior Function            PT Goals (current goals can now be found in the care plan section) Acute Rehab PT Goals Patient Stated Goal: to get stronger Progress towards PT goals: Progressing toward goals    Frequency    Min 2X/week      PT Plan Current plan remains appropriate    Co-evaluation              AM-PAC PT "6 Clicks" Daily Activity  Outcome Measure  Difficulty turning over in bed (including adjusting bedclothes, sheets and blankets)?: Unable Difficulty moving from lying on back to sitting on the side of the bed? :  Unable Difficulty sitting down on and standing up from a chair with arms (e.g., wheelchair, bedside commode, etc,.)?: Unable Help needed moving to and from a bed to chair (including a wheelchair)?: A Little Help needed walking in hospital room?: A Little Help needed climbing 3-5 steps with a railing? : A Little 6 Click Score: 12    End of Session Equipment Utilized During Treatment: Gait belt Activity Tolerance: Patient tolerated treatment well;Patient limited by fatigue;Treatment limited secondary to medical complications (Comment) Patient left: in bed;with call bell/phone within reach;with nursing/sitter in room Nurse Communication: Mobility status PT Visit Diagnosis: Unsteadiness on feet (R26.81);Muscle weakness (generalized) (M62.81)     Time: 6950-7225 PT Time Calculation (min) (ACUTE ONLY): 28  min  Charges:  $Gait Training: 8-22 mins $Therapeutic Exercise: 8-22 mins                     Governor Rooks, PTA Acute Rehabilitation Services Pager [phone] Office [phone]     Wilton Thrall Eli Hose 08/28/2018, 5:21 PM

## 2018-08-29 DIAGNOSIS — I10 Essential (primary) hypertension: Secondary | ICD-10-CM

## 2018-08-29 LAB — CBC WITH DIFFERENTIAL/PLATELET
ABS IMMATURE GRANULOCYTES: 0.02 10*3/uL (ref 0.00–0.07)
BASOS PCT: 1 %
Basophils Absolute: 0 10*3/uL (ref 0.0–0.1)
Eosinophils Absolute: 0.2 10*3/uL (ref 0.0–0.5)
Eosinophils Relative: 5 %
HEMATOCRIT: 25.6 % — AB (ref 36.0–46.0)
HEMOGLOBIN: 8 g/dL — AB (ref 12.0–15.0)
IMMATURE GRANULOCYTES: 1 %
LYMPHS ABS: 0.9 10*3/uL (ref 0.7–4.0)
Lymphocytes Relative: 21 %
MCH: 23.7 pg — AB (ref 26.0–34.0)
MCHC: 31.3 g/dL (ref 30.0–36.0)
MCV: 75.7 fL — AB (ref 80.0–100.0)
MONO ABS: 0.7 10*3/uL (ref 0.1–1.0)
MONOS PCT: 16 %
NEUTROS ABS: 2.6 10*3/uL (ref 1.7–7.7)
Neutrophils Relative %: 56 %
Platelets: 242 10*3/uL (ref 150–400)
RBC: 3.38 MIL/uL — ABNORMAL LOW (ref 3.87–5.11)
RDW: 18.3 % — ABNORMAL HIGH (ref 11.5–15.5)
WBC: 4.4 10*3/uL (ref 4.0–10.5)
nRBC: 0 % (ref 0.0–0.2)

## 2018-08-29 LAB — GLUCOSE, CAPILLARY
GLUCOSE-CAPILLARY: 174 mg/dL — AB (ref 70–99)
GLUCOSE-CAPILLARY: 106 mg/dL — AB (ref 70–99)
Glucose-Capillary: 89 mg/dL (ref 70–99)
Glucose-Capillary: 89 mg/dL (ref 70–99)
Glucose-Capillary: 119 mg/dL — ABNORMAL HIGH (ref 70–99)

## 2018-08-29 MED ORDER — LEVOFLOXACIN 500 MG PO TABS
500.0000 mg | ORAL_TABLET | ORAL | Status: DC
  Administered 2018-08-29: 500 mg via ORAL
  Filled 2018-08-29: qty 1

## 2018-08-29 MED ORDER — SODIUM CHLORIDE 0.9 % IV SOLN
INTRAVENOUS | Status: DC
  Administered 2018-08-29 (×2): via INTRAVENOUS

## 2018-08-29 MED ORDER — CLOPIDOGREL BISULFATE 75 MG PO TABS
75.0000 mg | ORAL_TABLET | Freq: Every day | ORAL | Status: DC
  Administered 2018-08-30: 75 mg via ORAL
  Filled 2018-08-29: qty 1

## 2018-08-29 MED ORDER — ENSURE ENLIVE PO LIQD
237.0000 mL | Freq: Two times a day (BID) | ORAL | Status: DC
  Administered 2018-08-29 – 2018-08-30 (×3): 237 mL via ORAL

## 2018-08-29 MED ORDER — METRONIDAZOLE 500 MG PO TABS: 500.0000 mg | ORAL_TABLET | Freq: Three times a day (TID) | ORAL | Status: DC

## 2018-08-29 MED ORDER — METRONIDAZOLE 500 MG PO TABS
500.0000 mg | ORAL_TABLET | Freq: Two times a day (BID) | ORAL | Status: DC
  Administered 2018-08-29 – 2018-08-30 (×3): 500 mg via ORAL
  Filled 2018-08-29 (×3): qty 1

## 2018-08-29 MED ORDER — CARVEDILOL 6.25 MG PO TABS
6.2500 mg | ORAL_TABLET | Freq: Two times a day (BID) | ORAL | Status: DC
  Administered 2018-08-29 – 2018-08-30 (×2): 6.25 mg via ORAL
  Filled 2018-08-29 (×2): qty 1

## 2018-08-29 NOTE — Progress Notes (Signed)
Nutrition Follow-Up  DOCUMENTATION CODES:   Not applicable  INTERVENTION:   - Continue Ensure Enlive BID (each provides 350 kcal and 20 g protein) - Continue Boost Breeze TID (each provides 250 kcal and 9 g protein) - Continue MVI with minerals  NUTRITION DIAGNOSIS:   Inadequate oral intake related to altered GI function, decreased appetite as evidenced by per patient/family report, meal completion < 50%.  Ongoing  GOAL:   Patient will meet greater than or equal to 90% of their needs  10/25 - not meeting  MONITOR:   PO intake, Supplement acceptance, Weight trends, Labs, Skin, I & O's   PO intake: 50-75% of meals Supplement acceptance: fair Weight trends: no change Labs: no significant change Skin: no significant change I&O's: per pt, significant diarrhea last night  REASON FOR ASSESSMENT:   Consult Assessment of nutrition requirement/status  ASSESSMENT:   Kendra Todd is a 77 y.o. female with medical history significant of RAS, CAD, HLD, PAF, DM2, HTN, Stroke, AAA.  Patient presents to the ED with c/o weakness and dizziness that onset this evening.   Labs on 10/24: sodium 133, BUN WNL, Creatinine 1.49, GFR 32/37 Labs on 1025: Hgb 8.0, Hct 25.6  Meds: colace, Boost Breeze TID, Ensure Enlive BID, MVI with minerals, protonix, miralax  10/25 Consult  Pt reports her appetite is improving, but it is still not great. States she is doing the best she can with eating. Denies nausea or vomiting. Describes being up all night with diarrhea and feels exhausted today. Amenable to continuing Ensure Enlive and [employer]. States she is not much of a meat eater, but does like eggs and dairy. Encouraged pt to include protein foods with every meal and snack.  NUTRITION - FOCUSED PHYSICAL EXAM: Completed on 10/21  Diet Order:  50-75% of last 3 meals, per nsg  Diet Order            Diet renal/carb modified with fluid restriction Diet-HS Snack? Nothing; Fluid restriction: 1200 mL  Fluid; Room service appropriate? Yes; Fluid consistency: Thin  Diet effective now              EDUCATION NEEDS:  Education needs have been addressed  Skin:  Skin Assessment: Reviewed RN Assessment  Last BM:  10/25, type 7  Height:  Ht Readings from Last 1 Encounters:  08/27/18 5\' 2"  (1.575 m)    Weight:  Wt Readings from Last 1 Encounters:  08/27/18 68 kg    Ideal Body Weight:  50 kg  BMI:  Body mass index is 27.42 kg/m.  Estimated Nutritional Needs:   Kcal:  4403 kcal/day(MSJ x 1.5)  Protein:  60-75 g protein/day(1.2-1.5 g/kg IBW)  Fluid:  1200 mL/day per MD  Kendra Grimmer, MS, RDN, LDN On-call pager: [phone]

## 2018-08-29 NOTE — Progress Notes (Signed)
Educated the patient that is was important to ambulate to help move bowel. Pt reported that she has had three BM in total through out the day. Offered patient to walk around unit but patient refused ambulation. She reported she felt light headed and that she has ambulated around the room.

## 2018-08-29 NOTE — Progress Notes (Signed)
PROGRESS NOTE  KALISE FICKETT NLZ:[id number] DOB: October 19, 1941 DOA: 08/22/2018 PCP: [name], DO  Brief narrative 77 y.o. female with medical history significant of RAS, CAD, HLD, PAF, DM2, HTN, Stroke, AAA, CKD, with recent bilateral renal artery stents placement by Dr. Gwenlyn Found,  presents to the ED with c/o abdominal pain, n/v, weakness/dizziness for the past few days.  She was noted to have hypotension, .  Subjective -responded  to laxative, with multiple bowel movements yesterday, loose and brown, reports that abdominal pain has significantly improved ,   Assessment/Plan: Principal Problem:   Nausea & vomiting Active Problems:   CAD (coronary artery disease): 1993 PTCA to circumflex.  2002 2 BMS stents to ostial, S/P CABG x 3 12/04/12   Bilateral renal artery stenosis (HCC)   Renovascular hypertension   Labile blood pressure   LLQ abdominal pain   Orthostatic hypotension   H. pylori infection   Abdominal pain  Left-sided abdominal pain  -Unclear etiology, started after bilateral renal artery stent placement, unclear if this is related to steal phenomena after bilateral uterine artery stent placements, in the setting of soft blood pressure and anemia, pain has been persistent, postprandial and related to eating, which makes it unlikely, her abdominal pain significantly improved after bowel movement, so constipation may be the major factor contributing to her pain. -CTA abdomen significant for at least 50% luminal narrowing of the origin of SMA, but celiac artery is patent, IMA is atretic, though appears patent . -GI input greatly appreciated -We will continue with her laxative regimen today as well  Candidal esophagitis -GI input greatly appreciated, endoscopy significant for esophagitis, possible candidiasis, breast biopsy confirmed candidal species, so she will need to continue fluconazole for 14 days  -Continue with PPI   H.Pylori - Positive in stool antigen, she was treated  with Biaxin/amoxicillin, she could not tolerate as it was causing nausea, vomiting and constipation, currently has been started on metronidazole and levofloxacin, she will need to finish total of 14 days.  Orthostatic hypotension/dizziness -Likely related to poor oral intake, Pandit to IV fluids, it is on the lower side today, so I will resume normal 700 cc/h, I have encouraged her to increase her oral intake.  AKI on CKD stage III Improved, back to baseline Baseline creatinine 1.5, on admission 2.29, improved with IV antibiotics. Likely prerenal S/P IV fluids, avoid nephrotoxic's Strict I's and O's  Iron deficiency/microcytic anemia -Recent work-up early September significant for iron deficiency anemia, she did receive iron as an outpatient 3 weeks ago, given another dose of iron, hemoglobin 7.8, which is consistent with her baseline.   Hyponatremia Ongoing S/P IV fluid Daily BMP   Chronic diastolic HF/pulmonary HTN Currently euvolemic, monitor closely as pt received IVF, may need to re-start home lasix ECHO on 02/2018 showed EF of 60-65%, G2DD, PA peak pressure 49 mm Hg Monitor closely  DM type II Last A1c 5.9 on 05/2018, controlled SSI, Accu-Cheks Hold home metformin  Renal artery stenosis s/p stent placement Recent ultrasound showed patent stents Continue aspirin,  Statins, I would resume Plavix and no procedure anticipated during hospital stay.  Hypertension -Patient presents with low blood pressure, 6 and had a creatinine on hold, I will discontinue meclizine as well, and decrease Coreg.     Code Status: Full  Family Communication: Son at bedside  Disposition Plan: Tomorrow   Consultants:  GI  Cardiology   Procedures:  Endoscopy 08/27/2018  Antimicrobials:  None  DVT prophylaxis: Heparin   Objective:  Vitals:   08/28/18 2023 08/29/18 0543 08/29/18 1421 08/29/18 1423  BP: 140/87 128/66 (!) 86/34 (!) 92/39  Pulse: (!) 58 67 (!) 54 (!) 52  Resp:  18 16 18    Temp: 97.9 F (36.6 C) 98.3 F (36.8 C) 98.5 F (36.9 C)   TempSrc:   Oral   SpO2: 96% 96% 100%   Weight:      Height:        Intake/Output Summary (Last 24 hours) at 08/29/2018 1533 Last data filed at 08/29/2018 1300 Gross per 24 hour  Intake 480 ml  Output 1 ml  Net 479 ml   Filed Weights   08/26/18 0423 08/26/18 0430 08/27/18 0802  Weight: 69.6 kg 68 kg 68 kg    Exam:  Awake Alert, Oriented X 3, No new F.N deficits, Normal affect Symmetrical Chest wall movement, Good air movement bilaterally, CTAB RRR,No Gallops,Rubs or new Murmurs, No Parasternal Heave +ve B.Sounds, Abd Soft,  tenderness has resolved, no rebound - guarding or rigidity. No Cyanosis, Clubbing or edema, No new Rash or bruise       Data Reviewed: CBC: Recent Labs  Lab 08/25/18 0530 08/26/18 0532 08/27/18 0335 08/28/18 0343 08/29/18 0511  WBC 5.1 4.8 4.5 4.6 4.4  NEUTROABS 3.4 3.1 2.8 2.7 2.6  HGB 7.8* 8.0* 7.9* 7.8* 8.0*  HCT 26.2* 26.6* 25.7* 25.3* 25.6*  MCV 77.3* 77.3* 76.5* 75.7* 75.7*  PLT 194 206 212 220 564   Basic Metabolic Panel: Recent Labs  Lab 08/24/18 0531 08/25/18 0530 08/26/18 0532 08/27/18 0335 08/28/18 0343  NA 132* 134* 131* 134* 133*  K 3.8 4.4 4.0 3.9 3.8  CL 104 108 105 105 105  CO2 21* 20* 19* 22 25  GLUCOSE 102* 113* 161* 100* 104*  BUN 16 12 9 9 8   CREATININE 1.65* 1.45* 1.41* 1.52* 1.49*  CALCIUM 7.7* 7.7* 7.9* 8.2* 8.3*   GFR: Estimated Creatinine Clearance: 27.7 mL/min (A) (by C-G formula based on SCr of 1.49 mg/dL (H)). Liver Function Tests: Recent Labs  Lab 08/22/18 2319  AST 50*  ALT 28  ALKPHOS 60  BILITOT 0.8  PROT 6.5  ALBUMIN 2.7*   Recent Labs  Lab 08/22/18 2319  LIPASE 35   No results for input(s): AMMONIA in the last 168 hours. Coagulation Profile: No results for input(s): INR, PROTIME in the last 168 hours. Cardiac Enzymes: No results for input(s): CKTOTAL, CKMB, CKMBINDEX, TROPONINI in the last 168 hours. BNP  (last 3 results) No results for input(s): PROBNP in the last 8760 hours. HbA1C: No results for input(s): HGBA1C in the last 72 hours. CBG: Recent Labs  Lab 08/28/18 2022 08/28/18 2351 08/29/18 0558 08/29/18 0732 08/29/18 1304  GLUCAP 120* 107* 89 89 174*   Lipid Profile: No results for input(s): CHOL, HDL, LDLCALC, TRIG, CHOLHDL, LDLDIRECT in the last 72 hours. Thyroid Function Tests: No results for input(s): TSH, T4TOTAL, FREET4, T3FREE, THYROIDAB in the last 72 hours. Anemia Panel: No results for input(s): VITAMINB12, FOLATE, FERRITIN, TIBC, IRON, RETICCTPCT in the last 72 hours. Urine analysis:    Component Value Date/Time   COLORURINE YELLOW 08/23/2018 1920   APPEARANCEUR CLEAR 08/23/2018 1920   LABSPEC 1.014 08/23/2018 1920   PHURINE 7.0 08/23/2018 1920   GLUCOSEU 50 (A) 08/23/2018 1920   HGBUR NEGATIVE 08/23/2018 1920   BILIRUBINUR NEGATIVE 08/23/2018 1920   BILIRUBINUR neg 07/18/2017 1118   KETONESUR NEGATIVE 08/23/2018 1920   PROTEINUR 100 (A) 08/23/2018 1920   UROBILINOGEN negative (A) 07/18/2017 1118  UROBILINOGEN 1.0 07/09/2014 0955   NITRITE NEGATIVE 08/23/2018 1920   LEUKOCYTESUR NEGATIVE 08/23/2018 1920   Sepsis Labs: @LABRCNTIP (procalcitonin:4,lacticidven:4)  )No results found for this or any previous visit (from the past 240 hour(s)).    Studies: No results found.  Scheduled Meds: . aspirin  81 mg Oral Daily  . carvedilol  12.5 mg Oral BID  . [START ON 08/30/2018] clopidogrel  75 mg Oral Q breakfast  . docusate sodium  100 mg Oral BID  . ezetimibe  10 mg Oral QHS  . feeding supplement  1 Container Oral TID BM  . feeding supplement (ENSURE ENLIVE)  237 mL Oral BID BM  . fluconazole  100 mg Oral Daily  . heparin injection (subcutaneous)  5,000 Units Subcutaneous Q8H  . hydrALAZINE  25 mg Oral BID  . insulin aspart  0-9 Units Subcutaneous Q4H  . latanoprost  1 drop Both Eyes QHS  . levofloxacin  500 mg Oral Q48H  . metroNIDAZOLE  500 mg Oral  Q12H  . multivitamin with minerals  1 tablet Oral Daily  . pantoprazole  40 mg Oral BID  . polyethylene glycol  17 g Oral BID  . rosuvastatin  40 mg Oral QHS  . sorbitol, milk of mag, mineral oil, glycerin (SMOG) enema  960 mL Rectal Once    Continuous Infusions:    LOS: 5 days     Phillips Climes, MD [facility]   If 7PM-7AM, please contact night-coverage [email or web address]  08/29/2018, 3:33 PM

## 2018-08-29 NOTE — Progress Notes (Signed)
          Daily Rounding Note  08/29/2018, 11:10 AM  LOS: 5 days   SUBJECTIVE:   Chief complaint: Abdominal pain. Pain is overall much better, not completely eliminated Started, finally, having stools yesterday afternoon.  These continued all night long through this morning.  Stools loose and brown.  No nausea.  OBJECTIVE:         Vital signs in last 24 hours:    Temp:  [97.9 F (36.6 C)-98.3 F (36.8 C)] 98.3 F (36.8 C) (10/25 0543) Pulse Rate:  [58-67] 67 (10/25 0543) Resp:  [16-18] 16 (10/25 0543) BP: (128-140)/(66-87) 128/66 (10/25 0543) SpO2:  [96 %] 96 % (10/25 0543) Last BM Date: 08/28/18 Filed Weights   08/26/18 0423 08/26/18 0430 08/27/18 0802  Weight: 69.6 kg 68 kg 68 kg   General: Chronically ill looking.  Comfortable. Heart: RRR. Chest: Clear bilaterally.  No labored breathing or cough Abdomen: Soft.  Minimal, diffuse tenderness.  No guarding or rebound.  Active bowel sounds.  Not distended. Extremities: No CCE. Neuro/Psych: Oriented x3.  Moves all 4 limbs.  Fully alert.  Intake/Output from previous day: No intake/output data recorded.  Intake/Output this shift: No intake/output data recorded.  Lab Results: Recent Labs    08/27/18 0335 08/28/18 0343 08/29/18 0511  WBC 4.5 4.6 4.4  HGB 7.9* 7.8* 8.0*  HCT 25.7* 25.3* 25.6*  PLT 212 220 242   BMET Recent Labs    08/27/18 0335 08/28/18 0343  NA 134* 133*  K 3.9 3.8  CL 105 105  CO2 22 25  GLUCOSE 100* 104*  BUN 9 8  CREATININE 1.52* 1.49*  CALCIUM 8.2* 8.3*     ASSESMENT:   *LLQ pain. Occurring after placing renal artery stent 07/21/18. ? Steal syndrome.  EGD 08/27/18:  LA Grade C erosive esophagitis. ? Esophageal candida, brushings pending. Stomach and duodenum normal.    CTA abd/pelvis: Increased size of infrarenal AAA plaque throughout the abdominal aorta with nonflow limiting short segment dissection in the distal abdominal  aorta.  ~ 50% narrowing at the SMA origin but celiac patent and has no significant narrowing.  IMA atresion but patent.  Suspect hemodynamically significant narrowing in the iliac arteries and left CFA.  If she truly had steal syndrome and hypo-perfusion of the mesenteric vessels, would expect postprandial exacerbation of pain but this is not the case.  * IDA. Recent Feraheme infusion. Hgb low but stable.   Pt did not want to pursue Colonoscopy or EGD at 08/05/18 office visit.  Has since had EGD as above.   * H Pylori + stool antigen, treated with biaxin/amoxil/. Suspect the meds caused n/v, constipation for >1 week PTA and these have been stopped.   * A fib.     PLAN   *   complete total 14-day course of Diflucan.  *   Change up her H. pylori eradication regimen using levofloxacin 500 mg daily, metronidazole 500 mg twice daily, PPI twice daily.   Aim to complete 14-day course.  *   From GI standpoint she is okay to go home.  *    Regarding home bowel, constipation medication regimen like to try double dose MiraLAX twice a day, i.e. 34 g twice daily and 2 Dulcolax per day.  *   fup prn with Dr Havery Moros.      Azucena Freed  08/29/2018, 11:10 AM Phone [phone]

## 2018-08-30 DIAGNOSIS — R1013 Epigastric pain: Secondary | ICD-10-CM

## 2018-08-30 DIAGNOSIS — K5900 Constipation, unspecified: Secondary | ICD-10-CM

## 2018-08-30 LAB — GLUCOSE, CAPILLARY
GLUCOSE-CAPILLARY: 169 mg/dL — AB (ref 70–99)
GLUCOSE-CAPILLARY: 91 mg/dL (ref 70–99)
GLUCOSE-CAPILLARY: 93 mg/dL (ref 70–99)
Glucose-Capillary: 123 mg/dL — ABNORMAL HIGH (ref 70–99)

## 2018-08-30 MED ORDER — FLUCONAZOLE 100 MG PO TABS: 100.0000 mg | ORAL_TABLET | Freq: Every day | ORAL | 0 refills | Status: DC

## 2018-08-30 MED ORDER — METRONIDAZOLE 500 MG PO TABS: 500.0000 mg | ORAL_TABLET | Freq: Two times a day (BID) | ORAL | 0 refills | Status: AC

## 2018-08-30 MED ORDER — POLYETHYLENE GLYCOL 3350 17 G PO PACK: 17.0000 g | PACK | Freq: Two times a day (BID) | ORAL | 0 refills | Status: DC

## 2018-08-30 MED ORDER — LEVOFLOXACIN 500 MG PO TABS: 500.0000 mg | ORAL_TABLET | ORAL | 0 refills | Status: AC

## 2018-08-30 NOTE — Discharge Summary (Signed)
Kendra Todd, is a 77 y.o. female  DOB [birth year]  MRN [id number].  Admission date:  08/22/2018  Admitting Physician  Etta Quill, DO  Discharge Date:  08/30/2018   Primary MD  [name], DO  Recommendations for primary care physician for things to follow:  -Check CBC, BMP during next visit -All her antihypertensive medication has been stopped secondary to orthostasis, monitor closely and resume as needed -Follow with GI as needed   Admission Diagnosis  Hyponatremia [E87.1] Acute kidney injury (nontraumatic) (HCC) [N17.9] Orthostatic dizziness [R42] Joint contracture, muscle atrophy, microcytic anemia, and panniculitis-induced lipodystrophy syndrome ([place]) [Q87.89, M04.9, L98.9, E88.1] Nausea & vomiting [R11.2]   Discharge Diagnosis  Hyponatremia [E87.1] Acute kidney injury (nontraumatic) (HCC) [N17.9] Orthostatic dizziness [R42] Joint contracture, muscle atrophy, microcytic anemia, and panniculitis-induced lipodystrophy syndrome ([place]) [Q87.89, M04.9, L98.9, E88.1] Nausea & vomiting [R11.2]    Principal Problem:   Nausea & vomiting Active Problems:   CAD (coronary artery disease): 1993 PTCA to circumflex.  2002 2 BMS stents to ostial, S/P CABG x 3 12/04/12   Bilateral renal artery stenosis (HCC)   Renovascular hypertension   Labile blood pressure   LLQ abdominal pain   Orthostatic hypotension   H. pylori infection   Abdominal pain      Past Medical History:  Diagnosis Date  . Abdominal pain, epigastric   . Acute upper respiratory infections of unspecified site   . Arthritis   . Benign paroxysmal positional vertigo   . CAD (coronary artery disease)    prior coronary stenting  . Candidiasis of vulva and vagina   . Carotid artery disease ([place]) 2001   s/p Bilateral CEA, after CVA  . Cervicalgia   . Decreased pedal pulses 06/04/2011   doppler - bilateral ABIs no evidence of  insufficiency; L CIA slightly elevated velocities 20-30% diameter reduction;   . Dyspnea 03/13/2006   Myoview - EF 76%; mild ischemia in apical lateral region, less severe than previous study in 2002  . Edema   . H/O carotid endarterectomy 03/06/2012   CT angio -high grade stenosis of proximal R internal carotid w/ lg posterior ulceration or dissection flap; near occlusive stenosis at origin of nondominantproximal L vertebral artery; <50% stenosis of proximal R vertebral and proximal L subclavian artery  . H/O endarterectomy 09/11/2012   patent L and R carotid sites, R 60-79% restenosed ICA; L <40% restenosed ICA  . H/O endarterectomy 01/08/2012   doppler - R distal common carotid/proximal ICA stenosed 80-99%, L 0-39%  . HTN (hypertension) 11/27/2012  . Hyperlipidemia   . Limb pain 05/02/2011   doppler - no evidence of thrombus of thrombophlebitis  . Long term (current) use of anticoagulants   . Loss of weight   . Lumbago   . Myalgia and myositis, unspecified   . Myocardial infarction ([place])   . Nonspecific abnormal results of liver function study   . Nontoxic uninodular goiter   . Osteoarthrosis, unspecified whether generalized or localized, unspecified site   . Other malaise  and fatigue   . Other manifestations of vitamin A deficiency   . PAF (paroxysmal atrial fibrillation) ([place]) 02/21/2011  . Pain in joint, pelvic region and thigh   . Pain in limb   . Peripheral vascular disease ([place])   . Phlebitis and thrombophlebitis of superficial vessels of lower extremities   . Primary localized osteoarthrosis, hand   . Pulmonary HTN ([place]) 12/03/2012  . Renal artery stenosis ([place])   . Routine gynecological examination   . Spinal stenosis, unspecified region other than cervical   . Stenosis of artery ([place]) 06/03/2012   doppler- most distal aspect of abd aorta no aneurysmal dilation; bilateral ABIs - mild L arterial insufficiency, L CIA narrowing w/ 50-69% diameter reduction; L and R SFA both w/ 0-49%  diameter reductions  . Stroke ([place]) 2001  . Superficial vein thrombosis 05/11/2015   Left forehead  . Tobacco use disorder   . TR (tricuspid regurgitation), severe 12/02/12 for repair with CABG 12/03/2012  . Type II or unspecified type diabetes mellitus with peripheral circulatory disorders, not stated as uncontrolled(250.70)   . Unspecified cataract   . Unspecified disorder of iris and ciliary body   . Unspecified vitamin D deficiency   . Urinary tract infection, site not specified     Past Surgical History:  Procedure Laterality Date  . ABDOMINAL HYSTERECTOMY  1975  . ANGIOPLASTY  1984 and 1982  . APPENDECTOMY  1954  . ARCH AORTOGRAM  03/30/2011   40% recurrent stenosis at origin of R proximal carotid patch, shelf like recurrent stenosis w/in midsection of patch that does not appear to be flow limiting; normal non-stenosed great vessel origins  . BACK SURGERY  '84,'86, '87   three previous surgeries  . CAROTID ENDARTERECTOMY  2001   Bilateral  . CORONARY ARTERY BYPASS GRAFT  12/04/2012   Procedure: CORONARY ARTERY BYPASS GRAFTING (CABG);  Surgeon: Ivin Poot, MD; LIMA-LAD, SVG-OM, SVG-RCA  . ESOPHAGOGASTRODUODENOSCOPY (EGD) WITH PROPOFOL N/A 08/27/2018   Procedure: ESOPHAGOGASTRODUODENOSCOPY (EGD) WITH PROPOFOL;  Surgeon: Mauri Pole, MD;  Location: [facility];  Service: Endoscopy;  Laterality: N/A;  . INTRAOPERATIVE TRANSESOPHAGEAL ECHOCARDIOGRAM  12/04/2012   Procedure: INTRAOPERATIVE TRANSESOPHAGEAL ECHOCARDIOGRAM;  Surgeon: Ivin Poot, MD;  Location: [facility];  Service: Open Heart Surgery;  Laterality: N/A;  . KIDNEY STONE SURGERY     Removal  . LEFT HEART CATHETERIZATION WITH CORONARY ANGIOGRAM N/A 11/28/2012   Procedure: LEFT HEART CATHETERIZATION WITH CORONARY ANGIOGRAM;  Surgeon: Leonie Man, MD;  Location: [facility] CATH LAB;  Service: Cardiovascular;  Laterality: N/A;  . PERCUTANEOUS CORONARY STENT INTERVENTION (PCI-S)  2002   2 BMS in ostial/Prox & mid RCA (mid  2.5 mm 20x  mm, & 2.75 mm x  8 mm ostial)  . PERIPHERAL VASCULAR INTERVENTION  07/21/2018   Procedure: PERIPHERAL VASCULAR INTERVENTION;  Surgeon: Lorretta Harp, MD;  Location: [facility] CV LAB;  Service: Cardiovascular;;  bilateral renal stents  . RENAL ANGIOGRAM N/A 11/30/2013   Procedure: RENAL ANGIOGRAM;  Surgeon: Lorretta Harp, MD;  Location: [facility] CATH LAB;  Service: Cardiovascular;  Laterality: N/A;  . RENAL ANGIOGRAPHY N/A 07/21/2018   Procedure: RENAL ANGIOGRAPHY;  Surgeon: Lorretta Harp, MD;  Location: [facility] CV LAB;  Service: Cardiovascular;  Laterality: N/A;  . RIGHT HEART CATHETERIZATION N/A 12/02/2012   Procedure: RIGHT HEART CATH;  Surgeon: Troy Sine, MD;  Location: [facility] CATH LAB;  Service: Cardiovascular;  Laterality: N/A;  . TEE WITH CARDIOVERSION  01/23/2011   EF 50-55%;  grade 2 diastolic dysfunction, elevated mean LA filling pressure, RV systolic pressure increased consistent w/ mod pulmonary hypertension       History of present illness and  Hospital Course:     Kindly see H&P for history of present illness and admission details, please review complete Labs, Consult reports and Test reports for all details in brief  HPI  from the history and physical done on the day of admission 08/23/2018  HPI: IVYLYNN HOPPES is a 77 y.o. female with medical history significant of RAS, CAD, HLD, PAF, DM2, HTN, Stroke, AAA.  Patient presents to the ED with c/o weakness and dizziness that onset this evening.  3 weeks ago or so she had stenting for B renal artery stenosis (RAS).  Since that time she has been having LLQ abdominal pain.  She saw Dr. [name] with [facility] on 10/1.  He did note that symptoms not typical mesenteric ischemia however that this was still considered a possibility.  Recd endoscopic evaluation though patient was concerned regarding risks of doing this as she had to be on Plavix for 3 months following RAS procedure (see his note for details).  Pain is  moderate in severity and 4/10.  They tried treating her for H. Pylori with course of ABx, she remains on PPI.  She received iron infusion on 10/7 and started ABx on 10/8.  Since that time she has been having N/V which has been persistent.  GI tried reducing ABx to once daily on 10/14 then stopping them yesterday 10/18 when that didn't work.  Yesterday however she developed weakness and dizziness, worse with standing.  EMS called, patient found to have severe orthostatic hypotension with BP dropping from 126/52 all the way to 68/32 when standing.  No CP, abd pain is 4/10 in LLQ.  No blood in stool nor melena.   Hospital Course    77 y.o.femalewith medical history significant ofRAS, CAD, HLD, PAF, DM2, HTN, Stroke, AAA, CKD, with recent bilateral renal artery stents placement by Dr. Gwenlyn Found,  presents to the ED with c/o abdominal pain, n/v, weakness/dizziness for the past few days.  She was noted to have hypotension, .   Left-sided abdominal pain  -Unclear etiology, started after bilateral renal artery stent placement, unclear if this is related to steal phenomena after bilateral uterine artery stent placements, in the setting of soft blood pressure and anemia, pain has been persistent, not postprandial and not  related to eating, which makes it unlikely related to steal phenomena especially her abdominal pain has improved after constipation has resolved, and today she denies any abdominal pain after multiple bowel movements over last 48 hours with laxatives. -To continue good bowel regimen and avoid constipation at home, have discussed with her to continue with MiraLAX, and to increase her dose if needed for at least 1-2 bowel movements per day. -Per GI , if this pain recurs as an outpatient, then she needs to follow with cardiology as an outpatient regarding further recommendation. -CTA abdomen significant for at least 50% luminal narrowing of the origin of SMA, but celiac artery is patent,  IMA is atretic, though appears patent . -GI input greatly appreciated  Candidal esophagitis -GI input greatly appreciated, endoscopy significant for esophagitis,biopsy confirmed candidal species, so she will need to continue fluconazole for 14 days , she is 4 days during hospital stay, to continue another 10 days as outpatient -Continue with PPI   H.Pylori - Positive in stool antigen, she was treated with Biaxin/amoxicillin, she  could not tolerate as it was causing nausea, vomiting and constipation, currently has been started on metronidazole and levofloxacin, she will need to finish total of 14 days.  (Another 12 days as an outpatient)  Orthostatic hypotension/dizziness -Patient with known history of renovascular hypertension, on multiple antihypertensive medication , while she was volume depleted on presentation, so it does appear to be multifactorial . -It does appear her blood pressure has totally resolved after her renal artery stents and fixing her renovascular hypertension, so all her antihypertensive medication has been stopped on discharge (blood pressure has been normal to soft during hospital stay off medication ).  AKI on CKD stage III Improved, back to baseline Baseline creatinine 1.5, on admission 2.29, improved with IV antibiotics. Likely prerenal S/P IV fluids, avoid nephrotoxic's  Iron deficiency/microcytic anemia -Recent work-up early September significant for iron deficiency anemia, she did receive IV  iron as an outpatient 3 weeks ago, given another dose of IV iron during hospital stay, she has allergy to oral iron   Hyponatremia Resolved   Chronic diastolic HF/pulmonary HTN Currently euvolemic, soft blood pressure I have stopped her Lasix and hydrochlorothiazide, to be resumed as an outpatient if indicated ECHO on 02/2018 showed EF of 60-65%, G2DD, PA peak pressure 49 mm Hg Monitor closely  DM type II Resume home dose metformin  Renal artery stenosis  s/p stent placement Recent ultrasound showed patent stents Continue aspirin,  Statins, and Plavix  Hypertension -Please see above discussion      Discharge Condition: Stable   Follow UP  [facility] Follow up.   Why:  home health PT Contact information: [place] [address] [place], DO Follow up in 1 week(s).   Specialty:  Geriatric Medicine Contact information: [address]. [place] [address] [phone]             Discharge Instructions  and  Discharge Medications     Discharge Instructions    Discharge instructions   Complete by:  As directed    weakness, anemia   Increase activity slowly   Complete by:  As directed      Allergies as of 08/30/2018      Reactions   Iron Hives   Vicodin [hydrocodone-acetaminophen] Other (See Comments)   Caused arrythmia    Amiodarone Nausea And Vomiting   Norvasc [amlodipine Besylate] Nausea And Vomiting   Promethazine Other (See Comments)   hallucinations   Vioxx [rofecoxib] Nausea And Vomiting      Medication List    STOP taking these medications   carvedilol 25 MG tablet Commonly known as:  COREG   furosemide 20 MG tablet Commonly known as:  LASIX   hydrALAZINE 25 MG tablet Commonly known as:  APRESOLINE   hydrochlorothiazide 12.5 MG tablet Commonly known as:  HYDRODIURIL   meloxicam 7.5 MG tablet Commonly known as:  MOBIC     TAKE these medications   alendronate 70 MG tablet Commonly known as:  FOSAMAX Take 70 mg by mouth every Friday.   aspirin 81 MG tablet Take 81 mg by mouth daily.   bimatoprost 0.01 % Soln Commonly known as:  LUMIGAN Place 1 drop into both eyes at bedtime.   clopidogrel 75 MG tablet Commonly known as:  PLAVIX Take 1 tablet (75 mg total) by mouth daily with breakfast.   dicyclomine 10 MG capsule Commonly known as:  BENTYL Take 3 times daily as needed for  spasm. What changed:    how much to take  how to take this  when to take this  reasons to take this  additional instructions   ezetimibe 10 MG tablet Commonly known as:  ZETIA Take 10 mg by mouth at bedtime.   fluconazole 100 MG tablet Commonly known as:  DIFLUCAN Take 1 tablet (100 mg total) by mouth daily. Start taking on:  08/31/2018   levofloxacin 500 MG tablet Commonly known as:  LEVAQUIN Take 1 tablet (500 mg total) by mouth every other day for 10 days. Start taking on:  08/31/2018   metFORMIN 1000 MG tablet Commonly known as:  GLUCOPHAGE TAKE 1 TABLET TWICE A DAY WITH MEALS   metroNIDAZOLE 500 MG tablet Commonly known as:  FLAGYL Take 1 tablet (500 mg total) by mouth every 12 (twelve) hours for 11 days.   nitroGLYCERIN 0.4 MG SL tablet Commonly known as:  NITROSTAT Place 1 tablet (0.4 mg total) under the tongue every 5 (five) minutes as needed for chest pain.   omeprazole 40 MG capsule Commonly known as:  PRILOSEC Take 1 capsule (40 mg total) by mouth 2 (two) times daily.   ondansetron 8 MG disintegrating tablet Commonly known as:  ZOFRAN-ODT Take 1 tablet (8 mg total) by mouth every 8 (eight) hours as needed for nausea or vomiting.   polyethylene glycol packet Commonly known as:  MIRALAX / GLYCOLAX Take 17 g by mouth 2 (two) times daily.   rosuvastatin 40 MG tablet Commonly known as:  CRESTOR Take one tablet by mouth once daily for cholesterol What changed:    how much to take  how to take this  when to take this  additional instructions   traMADol 50 MG tablet Commonly known as:  ULTRAM Take 1 tablet (50 mg total) by mouth every 8 (eight) hours as needed. What changed:  reasons to take this         Diet and Activity recommendation: See Discharge Instructions above   Consults obtained -  GI   Major procedures and Radiology Reports - PLEASE review detailed and final reports for all details, in brief -    Endoscopy  Findings: Patchy, white plaques were found in the entire esophagus. Brushings for KOH prep were obtained in the entire esophagus. The Z-line was regular and was found 38 cm from the incisors. The stomach was normal. The examined duodenum was normal. - LA Grade C erosive esophagitis. - Esophageal plaques were found, suspicious for candidiasis. Brushings performed. - Z-line regular, 38 cm from the incisors. - Normal stomach. - Normal examined duodenum. - Esophageal plaques were found, suspicious for candidiasis. Brushings performed. - Z-line regular, 38 cm from the incisors. - Normal stomach. - Normal examined duodenum. - Patient has a contact number available for emergencies. The signs and symptoms of potential delayed complications were discussed with the patient. Return to normal activities tomorrow. Written discharge instructions were provided to the patient. - Resume previous diet. - Continue present medications. - Await pathology results. - Use Protonix (pantoprazole) 40 mg PO BID. - Follow an antireflux regimen. - Diflucan (fluconazole) 100 mg PO daily for 7 days. - Follow CT angio  Dg Chest Port 1 View  Result Date: 08/26/2018 CLINICAL DATA:  Shortness of breath EXAM: PORTABLE CHEST 1 VIEW COMPARISON:  CT chest 05/28/2018 FINDINGS: Diffuse bilateral interstitial thickening. Small left pleural effusion. No pneumothorax. No focal consolidation. Stable cardiomegaly. Prior CABG. Tortuous aorta. No acute  osseous abnormality. IMPRESSION: Findings concerning for mild CHF. Electronically Signed   By: Kathreen Devoid   On: 08/26/2018 10:05   Dg Abd 2 Views  Result Date: 08/25/2018 CLINICAL DATA:  Abdominal pain for 1 week EXAM: ABDOMEN - 2 VIEW COMPARISON:  05/28/2018 FINDINGS: Scattered large and small bowel gas is noted. No abnormal mass or abnormal calcifications are seen. No obstructive changes are noted. Bilateral renal stents are seen. Mild degenerative change of  the lumbar spine is noted. IMPRESSION: No acute abnormality noted. Electronically Signed   By: Inez Catalina M.D.   On: 08/25/2018 08:19   Ct Angio Abd/pel W/ And/or W/o  Result Date: 08/27/2018 CLINICAL DATA:  Abdominal pain. Unintentional weight loss. Evaluate for mesenteric ischemia. EXAM: CTA ABDOMEN AND PELVIS WITH CONTRAST TECHNIQUE: Multidetector CT imaging of the abdomen and pelvis was performed using the standard protocol during bolus administration of intravenous contrast. Multiplanar reconstructed images and MIPs were obtained and reviewed to evaluate the vascular anatomy. CONTRAST:  29mL ISOVUE-370 IOPAMIDOL (ISOVUE-370) INJECTION 76% COMPARISON:  CT the chest, abdomen pelvis-05/28/2018; 01/19/2016; 07/28/2015 FINDINGS: VASCULAR Aorta: Aneurysmal dilatation of the imaged distal aspect of the descending thoracic aorta measuring approximately 39 mm in diameter (image 1, series 12, similar to the 01/2016 examination. Large amount of irregular mixed calcified though predominantly noncalcified atherosclerotic plaque throughout the abdominal aorta, not definitely resulting in hemodynamically significant stenosis. Interval increase in size of aneurysmal dilatation of the infrarenal abdominal aorta measuring approximately 3.6 x 3.6 x 3.2 cm in greatest oblique short axis axial (image 57, series 6), coronal (image 59, series 10) and sagittal (image 99, series 9) dimensions respectively, previously, 2.8 x 3.5 x 3.2 cm. Grossly unchanged appearance of thrombosed approximately 1.3 x 0.7 cm penetrating atherosclerotic ulcer arising from the medial aspect of the infrarenal abdominal aorta (image 53, series 6). Short-segment dissections within the distal aspect of the abdominal aorta (images 66, series 6 and image 70, series 6), similar to the 01/2016 examination and again not definitely resulting in a hemodynamically significant stenosis. No periaortic stranding. Celiac: There is a minimal amount of mixed  calcified and noncalcified atherosclerotic plaque involving the origin the celiac artery, not resulting in a hemodynamically significant stenosis. Conventional branching pattern. SMA: Moderate to large amount of predominantly noncalcified atherosclerotic plaque involving the origin of the SMA resulting in at least 50% luminal narrowing (axial image 43, series 6; sagittal image 102, series 9). Conventional branching pattern. The distal tributaries of the SMA appear widely patent without discrete intraluminal filling defect to suggest distal embolism. Renals: Solitary bilaterally patient has undergone stenting of the origin of the bilateral renal arteries the patency of which is suboptimally evaluated secondary to streak artifact. The remainder the right renal artery appears patent without definitive hemodynamically significant stenosis or vessel irregularity to suggest FMD. The left renal artery appears somewhat atretic though supplies a slightly asymmetrically atrophic left kidney. IMA: Atretic though appears patent Inflow: There is a large amount of irregular mixed calcified and noncalcified atherosclerotic plaque involving the bilateral normal caliber common iliac arteries, not definitely resulting in a hemodynamically significant stenosis. Short-segment non flow limiting dissection within the right common iliac artery (image 79, series 6), similar to the 01/2016 examination. Suspected hemodynamically significant narrowings involving the origin of the bilateral common iliac arteries (coronal image 73, series 10). There is mild fusiform ectasia of the left internal iliac artery measuring 1 cm in diameter (image 91, series 6), similar to the 01/2016 examination. The right internal iliac artery is  diseased though of normal caliber. Proximal Outflow: Mixed calcified and noncalcified atherosclerotic plaque within the left common femoral artery (images 113 12/06/2013, series 6) approaches 50% luminal narrowing.  Atherosclerotic plaque within the right common femoral artery, not definitely resulting in a hemodynamically significant stenosis. The imaged portions of the bilateral superficial femoral arteries appear patent without definitive hemodynamically significant narrowing. Veins: The IVC and pelvic venous system appear widely patent. Review of the MIP images confirms the above findings. NON-VASCULAR Lower chest: Limited visualization of the lower thorax demonstrates small bilateral effusions with associated bibasilar consolidative opacities, left greater than right. Cardiomegaly.  No pericardial effusion. Hepatobiliary: There is mild nodularity hepatic contour. There is a minimal amount of focal fatty infiltration adjacent to the fissure for ligamentum teres. No discrete hepatic lesions. Normal appearance of the gallbladder given degree distention. No intra extrahepatic bili duct dilatation. No ascites. Pancreas: Normal appearance of the pancreas. Spleen: Normal appearance of the spleen Adrenals/Urinary Tract: There is symmetric enhancement of the bilateral kidneys. The left kidney again remains atrophic in comparison to the right. No definite renal stones this postcontrast examination. No urine obstruction or perinephric stranding. No discrete renal lesions. Normal appearance of the bilateral adrenal glands. Normal appearance of the urinary bladder given degree distention. Stomach/Bowel: Moderate colonic stool burden without evidence of enteric obstruction. Normal appearance of the terminal ileum. The appendix is not visualized, however there is no pericecal inflammatory change. No pneumoperitoneum, pneumatosis or portal venous gas. Lymphatic: No bulky retroperitoneal, mesenteric, pelvic or inguinal lymphadenopathy. Reproductive: Normal appearance the prostate gland. No free fluid in the pelvic cul-de-sac. Other: Diffuse body wall anasarca. Musculoskeletal: No acute or aggressive osseous abnormalities. Moderate DDD of  L5-S1 with disc space height loss, endplate irregularity and sclerosis. Moderate severe degenerative change the bilateral hips, left greater than right. IMPRESSION: VASCULAR 1. Interval increase in size of infrarenal abdominal aortic aneurysm, measuring 3.6 x 3.6 x 3.2 cm, previously, 2.8 x 3.5 x 3.2 cm. Aortic aneurysm NOS (ICD10-I71.9). 2. Large amount of irregular mixed calcified though prominently noncalcified atherosclerotic plaque throughout the abdominal aorta including non flow limiting short-segment dissection distal aspect of the abdominal aorta, similar to the 01/2016 examination. Aortic Atherosclerosis (ICD10-I70.0). 3. At least 50% luminal narrowing involving the origin the SMA however the celiac artery is patent without hemodynamically significant narrowing. The IMA is atretic though appears patent. 4. Suspected hemodynamically significant narrowings involving the origin of the bilateral external iliac arteries as well as the left common femoral artery. 5. Post stenting of the origin of the bilateral renal arteries. NON-VASCULAR 1. Moderate colonic stool burden without evidence of enteric obstruction. No discrete abnormal areas of bowel wall thickening, pneumatosis or portal venous gas. 2. Cardiomegaly with small bilateral effusions with associated bibasilar opacities, left greater than likely atelectasis. 3. Mild nodularity hepatic contour as could be seen in the setting early cirrhotic change. 4. Stable asymmetric atrophy of the left kidney in comparison right without evidence of urinary obstruction. Electronically Signed   By: Sandi Mariscal M.D.   On: 08/27/2018 08:42    Micro Results     No results found for this or any previous visit (from the past 240 hour(s)).     Today   Subjective:   Jasmarie Coppock today has no headache, she denies any further abdominal pain, results, reports she had good appetite yesterday, she had 3 bowel movements over last 24 hours, she denies any complaints  today, no dizziness, no lightheadedness .  Objective:   Blood pressure [name])  151/88, pulse 65, temperature 98.2 F (36.8 C), resp. rate 16, height 5\' 2"  (1.575 m), weight 68 kg, SpO2 100 %.   Intake/Output Summary (Last 24 hours) at 08/30/2018 1217 Last data filed at 08/29/2018 1700 Gross per 24 hour  Intake 240 ml  Output 1 ml  Net 239 ml    Exam Awake Alert, Oriented x 3, No new F.N deficits, Normal affect  Symmetrical Chest wall movement, Good air movement bilaterally, CTAB RRR,No Gallops,Rubs or new Murmurs, No Parasternal Heave +ve B.Sounds, Abd Soft, Non tender, No organomegaly appriciated, No rebound -guarding or rigidity. No Cyanosis, Clubbing or edema, No new Rash or bruise  Data Review   CBC w Diff:  Lab Results  Component Value Date   WBC 4.4 08/29/2018   HGB 8.0 (L) 08/29/2018   HGB 9.7 (L) 07/15/2018   HCT 25.6 (L) 08/29/2018   HCT 32.2 (L) 07/15/2018   PLT 242 08/29/2018   PLT 210 07/15/2018   LYMPHOPCT 21 08/29/2018   MONOPCT 16 08/29/2018   EOSPCT 5 08/29/2018   BASOPCT 1 08/29/2018    CMP:  Lab Results  Component Value Date   NA 133 (L) 08/28/2018   NA 142 07/15/2018   K 3.8 08/28/2018   CL 105 08/28/2018   CO2 25 08/28/2018   BUN 8 08/28/2018   BUN 23 07/15/2018   CREATININE 1.49 (H) 08/28/2018   CREATININE 1.57 (H) 06/03/2018   PROT 6.5 08/22/2018   PROT 6.6 02/06/2016   ALBUMIN 2.7 (L) 08/22/2018   ALBUMIN 4.1 02/06/2016   BILITOT 0.8 08/22/2018   BILITOT 0.4 02/06/2016   ALKPHOS 60 08/22/2018   AST 50 (H) 08/22/2018   ALT 28 08/22/2018  .   Total Time in preparing paper work, data evaluation and todays exam - 51 minutes  Phillips Climes M.D on 08/30/2018 at 12:17 PM  [facility]   Office  [phone]

## 2018-08-30 NOTE — Discharge Instructions (Signed)
Follow with Primary MD Mariea Clonts, Tiffany L, DO in 7 days   Get CBC, CMP,  checked  by Primary MD next visit.    Activity: As tolerated with Full fall precautions use walker/cane & assistance as needed   Disposition Home    Diet: Heart healthy, carbohydrate modified, with feeding assistance and aspiration precautions.  For Heart failure patients - Check your Weight same time everyday, if you gain over 2 pounds, or you develop in leg swelling, experience more shortness of breath or chest pain, call your Primary MD immediately. Follow Cardiac Low Salt Diet and 1.5 lit/day fluid restriction.   On your next visit with your primary care physician please Get Medicines reviewed and adjusted.   Please request your Prim.MD to go over all Hospital Tests and Procedure/Radiological results at the follow up, please get all Hospital records sent to your Prim MD by signing hospital release before you go home.   If you experience worsening of your admission symptoms, develop shortness of breath, life threatening emergency, suicidal or homicidal thoughts you must seek medical attention immediately by calling 911 or calling your MD immediately  if symptoms less severe.  You Must read complete instructions/literature along with all the possible adverse reactions/side effects for all the Medicines you take and that have been prescribed to you. Take any new Medicines after you have completely understood and accpet all the possible adverse reactions/side effects.   Do not drive, operating heavy machinery, perform activities at heights, swimming or participation in water activities or provide baby sitting services if your were admitted for syncope or siezures until you have seen by Primary MD or a Neurologist and advised to do so again.  Do not drive when taking Pain medications.    Do not take more than prescribed Pain, Sleep and Anxiety Medications  Special Instructions: If you have smoked or chewed Tobacco   in the last 2 yrs please stop smoking, stop any regular Alcohol  and or any Recreational drug use.  Wear Seat belts while driving.   Please note  You were cared for by a hospitalist during your hospital stay. If you have any questions about your discharge medications or the care you received while you were in the hospital after you are discharged, you can call the unit and asked to speak with the hospitalist on call if the hospitalist that took care of you is not available. Once you are discharged, your primary care physician will handle any further medical issues. Please note that NO REFILLS for any discharge medications will be authorized once you are discharged, as it is imperative that you return to your primary care physician (or establish a relationship with a primary care physician if you do not have one) for your aftercare needs so that they can reassess your need for medications and monitor your lab values.

## 2018-08-30 NOTE — Care Management Note (Signed)
Case Management Note  Patient Details  Name: JOZIE WULF MRN: [id number] Date of Birth: 06-18-1941  Subjective/Objective:                    Action/Plan:  Discussed discharge planning with patient at bedside. Patient has walker at home, does not want 3 in 1. Referral for [facility]PT [facility] to [facility]  Expected Discharge Date:                  Expected Discharge Plan:  [place]  In-House Referral:     Discharge planning Services  CM Consult  Post Acute Care Choice:  Home Health Choice offered to:  Patient  DME Arranged:  N/A DME Agency:  NA  HH Arranged:  PT [place] Agency:  [facility]  Status of Service:  Completed, signed off  If discussed at [place] of Stay Meetings, dates discussed:    Additional Comments:  Marilu Favre, RN 08/30/2018, 9:30 AM

## 2018-08-30 NOTE — Progress Notes (Signed)
Nsg Discharge Note  Admit Date:  08/22/2018 Discharge date: 08/30/2018   [name] to be D/C'd Home per MD order.  AVS completed.  Copy for chart, and copy for patient signed, and dated. Patient/caregiver able to verbalize understanding.  Discharge Medication: Allergies as of 08/30/2018      Reactions   Iron Hives   Vicodin [hydrocodone-acetaminophen] Other (See Comments)   Caused arrythmia    Amiodarone Nausea And Vomiting   Norvasc [amlodipine Besylate] Nausea And Vomiting   Promethazine Other (See Comments)   hallucinations   Vioxx [rofecoxib] Nausea And Vomiting      Medication List    STOP taking these medications   carvedilol 25 MG tablet Commonly known as:  COREG   furosemide 20 MG tablet Commonly known as:  LASIX   hydrALAZINE 25 MG tablet Commonly known as:  APRESOLINE   hydrochlorothiazide 12.5 MG tablet Commonly known as:  HYDRODIURIL   meloxicam 7.5 MG tablet Commonly known as:  MOBIC     TAKE these medications   alendronate 70 MG tablet Commonly known as:  FOSAMAX Take 70 mg by mouth every Friday.   aspirin 81 MG tablet Take 81 mg by mouth daily.   bimatoprost 0.01 % Soln Commonly known as:  LUMIGAN Place 1 drop into both eyes at bedtime.   clopidogrel 75 MG tablet Commonly known as:  PLAVIX Take 1 tablet (75 mg total) by mouth daily with breakfast.   dicyclomine 10 MG capsule Commonly known as:  BENTYL Take 3 times daily as needed for spasm. What changed:    how much to take  how to take this  when to take this  reasons to take this  additional instructions   ezetimibe 10 MG tablet Commonly known as:  ZETIA Take 10 mg by mouth at bedtime.   fluconazole 100 MG tablet Commonly known as:  DIFLUCAN Take 1 tablet (100 mg total) by mouth daily. Start taking on:  08/31/2018   levofloxacin 500 MG tablet Commonly known as:  LEVAQUIN Take 1 tablet (500 mg total) by mouth every other day for 10 days. Start taking on:   08/31/2018   metFORMIN 1000 MG tablet Commonly known as:  GLUCOPHAGE TAKE 1 TABLET TWICE A DAY WITH MEALS   metroNIDAZOLE 500 MG tablet Commonly known as:  FLAGYL Take 1 tablet (500 mg total) by mouth every 12 (twelve) hours for 11 days.   nitroGLYCERIN 0.4 MG SL tablet Commonly known as:  NITROSTAT Place 1 tablet (0.4 mg total) under the tongue every 5 (five) minutes as needed for chest pain.   omeprazole 40 MG capsule Commonly known as:  PRILOSEC Take 1 capsule (40 mg total) by mouth 2 (two) times daily.   ondansetron 8 MG disintegrating tablet Commonly known as:  ZOFRAN-ODT Take 1 tablet (8 mg total) by mouth every 8 (eight) hours as needed for nausea or vomiting.   polyethylene glycol packet Commonly known as:  MIRALAX / GLYCOLAX Take 17 g by mouth 2 (two) times daily.   rosuvastatin 40 MG tablet Commonly known as:  CRESTOR Take one tablet by mouth once daily for cholesterol What changed:    how much to take  how to take this  when to take this  additional instructions   traMADol 50 MG tablet Commonly known as:  ULTRAM Take 1 tablet (50 mg total) by mouth every 8 (eight) hours as needed. What changed:  reasons to take this       Discharge Assessment: Vitals:  08/30/18 0515 08/30/18 0916  BP: (!) 133/59 (!) 151/88  Pulse: 72 65  Resp: 16   Temp: 98.2 F (36.8 C)   SpO2: 100%    Skin clean, dry and intact without evidence of skin break down, no evidence of skin tears noted. IV catheter discontinued intact. Site without signs and symptoms of complications - no redness or edema noted at insertion site, patient denies c/o pain - only slight tenderness at site.  Dressing with slight pressure applied.  D/c Instructions-Education: Discharge instructions given to patient/family with verbalized understanding. D/c education completed with patient/family including follow up instructions, medication list, d/c activities limitations if indicated, with other d/c  instructions as indicated by MD - patient able to verbalize understanding, all questions fully answered. Patient instructed to return to ED, call 911, or call MD for any changes in condition.  Patient escorted via [place], and D/C home via private auto.  Hiram Comber, RN 08/30/2018 1:06 PM

## 2018-09-01 ENCOUNTER — Other Ambulatory Visit: Payer: Self-pay | Admitting: *Deleted

## 2018-09-01 ENCOUNTER — Telehealth: Payer: Self-pay

## 2018-09-01 DIAGNOSIS — E1151 Type 2 diabetes mellitus with diabetic peripheral angiopathy without gangrene: Secondary | ICD-10-CM | POA: Diagnosis not present

## 2018-09-01 DIAGNOSIS — I951 Orthostatic hypotension: Secondary | ICD-10-CM | POA: Diagnosis not present

## 2018-09-01 DIAGNOSIS — Z7984 Long term (current) use of oral hypoglycemic drugs: Secondary | ICD-10-CM | POA: Diagnosis not present

## 2018-09-01 DIAGNOSIS — I2510 Atherosclerotic heart disease of native coronary artery without angina pectoris: Secondary | ICD-10-CM | POA: Diagnosis not present

## 2018-09-01 DIAGNOSIS — I5032 Chronic diastolic (congestive) heart failure: Secondary | ICD-10-CM | POA: Diagnosis not present

## 2018-09-01 DIAGNOSIS — Z7902 Long term (current) use of antithrombotics/antiplatelets: Secondary | ICD-10-CM | POA: Diagnosis not present

## 2018-09-01 DIAGNOSIS — I480 Paroxysmal atrial fibrillation: Secondary | ICD-10-CM | POA: Diagnosis not present

## 2018-09-01 DIAGNOSIS — B3781 Candidal esophagitis: Secondary | ICD-10-CM | POA: Diagnosis not present

## 2018-09-01 DIAGNOSIS — I272 Pulmonary hypertension, unspecified: Secondary | ICD-10-CM | POA: Diagnosis not present

## 2018-09-01 DIAGNOSIS — I150 Renovascular hypertension: Secondary | ICD-10-CM | POA: Diagnosis not present

## 2018-09-01 DIAGNOSIS — Z7982 Long term (current) use of aspirin: Secondary | ICD-10-CM | POA: Diagnosis not present

## 2018-09-01 DIAGNOSIS — Z95828 Presence of other vascular implants and grafts: Secondary | ICD-10-CM | POA: Diagnosis not present

## 2018-09-01 DIAGNOSIS — Z8673 Personal history of transient ischemic attack (TIA), and cerebral infarction without residual deficits: Secondary | ICD-10-CM | POA: Diagnosis not present

## 2018-09-01 DIAGNOSIS — E1122 Type 2 diabetes mellitus with diabetic chronic kidney disease: Secondary | ICD-10-CM | POA: Diagnosis not present

## 2018-09-01 DIAGNOSIS — S29011A Strain of muscle and tendon of front wall of thorax, initial encounter: Secondary | ICD-10-CM

## 2018-09-01 DIAGNOSIS — D509 Iron deficiency anemia, unspecified: Secondary | ICD-10-CM | POA: Diagnosis not present

## 2018-09-01 DIAGNOSIS — I110 Hypertensive heart disease with heart failure: Secondary | ICD-10-CM | POA: Diagnosis not present

## 2018-09-01 DIAGNOSIS — E785 Hyperlipidemia, unspecified: Secondary | ICD-10-CM | POA: Diagnosis not present

## 2018-09-01 DIAGNOSIS — N183 Chronic kidney disease, stage 3 (moderate): Secondary | ICD-10-CM | POA: Diagnosis not present

## 2018-09-01 MED ORDER — TRAMADOL HCL 50 MG PO TABS: 50.0000 mg | ORAL_TABLET | Freq: Three times a day (TID) | ORAL | 0 refills | Status: DC | PRN

## 2018-09-01 NOTE — Telephone Encounter (Signed)
Transition Care Management Follow-up Telephone Call  Date of discharge and from where: [facility] on 08/30/2018  How have you been since you were released from the hospital? Still feeling weakness. Home health PT coming out this afternoon. Some dizziness. Pain in left side 8/10 right before tramodol.  Any questions or concerns? Yes , numbbess in hands.  Items Reviewed:  Did the pt receive and understand the discharge instructions provided? Yes   Medications obtained and verified? Yes   Any new allergies since your discharge? No  Dietary orders reviewed? Yes  Do you have support at home? Yes , daughter lives with her  Other (ie: DME, Home Health, etc) Home health starting today  Functional Questionnaire: (I = Independent and D = Dependent) ADL's: I  Bathing/Dressing- I   Meal Prep- I  Eating- I  Maintaining continence- I  Transferring/Ambulation- I  Managing Meds- I w/ assist of daughter   Follow up appointments reviewed:    PCP Hospital f/u appt confirmed? Yes  Scheduled to see Dr. Mariea Clonts on 09/04/2018 @ 11:30am.  [facility] f/u appt confirmed? N/A   Are transportation arrangements needed? No   If their condition worsens, is the pt aware to call  their PCP or go to the ED? Yes  Was the patient provided with contact information for the PCP's office or ED? Yes  Was the pt encouraged to call back with questions or concerns? Yes

## 2018-09-01 NOTE — Progress Notes (Signed)
Thanks Kavitha for update. Hopefully she keeps on improving.  Chester Holstein

## 2018-09-01 NOTE — Telephone Encounter (Signed)
Patient requested refill [facility] Verified LR: 03/05/2018 Pended Rx and sent to Dr. Mariea Clonts for approval.

## 2018-09-02 ENCOUNTER — Telehealth: Payer: Self-pay | Admitting: *Deleted

## 2018-09-02 NOTE — Telephone Encounter (Signed)
Patient called and is wondering if she can take the Ondansetron more often than every 8 hours. Stated that it is wearing off and she is getting nauseated before its time to take more. Please Advise.    Patient has an appointment on 09/04/2018 to follow up from the hospital.

## 2018-09-02 NOTE — Telephone Encounter (Signed)
Ok to take ondansetron (zofran) every 6 hours rather than every 8 hrs.  I suspect the antibiotics (flagyl) she is taking may worsen nausea, also, but these are necessary.

## 2018-09-02 NOTE — Telephone Encounter (Signed)
Patient notified and agreed. Medication list updated.  

## 2018-09-04 ENCOUNTER — Encounter: Payer: Self-pay | Admitting: Internal Medicine

## 2018-09-04 ENCOUNTER — Ambulatory Visit (INDEPENDENT_AMBULATORY_CARE_PROVIDER_SITE_OTHER): Payer: Medicare Other | Admitting: Internal Medicine

## 2018-09-04 VITALS — BP 140/80 | HR 52 | Temp 98.0°F | Ht 62.0 in | Wt 136.0 lb

## 2018-09-04 DIAGNOSIS — I951 Orthostatic hypotension: Secondary | ICD-10-CM

## 2018-09-04 DIAGNOSIS — S29011A Strain of muscle and tendon of front wall of thorax, initial encounter: Secondary | ICD-10-CM | POA: Diagnosis not present

## 2018-09-04 DIAGNOSIS — N183 Chronic kidney disease, stage 3 unspecified: Secondary | ICD-10-CM

## 2018-09-04 DIAGNOSIS — I480 Paroxysmal atrial fibrillation: Secondary | ICD-10-CM

## 2018-09-04 DIAGNOSIS — I701 Atherosclerosis of renal artery: Secondary | ICD-10-CM

## 2018-09-04 DIAGNOSIS — Z9889 Other specified postprocedural states: Secondary | ICD-10-CM

## 2018-09-04 DIAGNOSIS — K2970 Gastritis, unspecified, without bleeding: Secondary | ICD-10-CM | POA: Diagnosis not present

## 2018-09-04 DIAGNOSIS — B9681 Helicobacter pylori [H. pylori] as the cause of diseases classified elsewhere: Secondary | ICD-10-CM | POA: Insufficient documentation

## 2018-09-04 DIAGNOSIS — D509 Iron deficiency anemia, unspecified: Secondary | ICD-10-CM

## 2018-09-04 DIAGNOSIS — E1122 Type 2 diabetes mellitus with diabetic chronic kidney disease: Secondary | ICD-10-CM

## 2018-09-04 DIAGNOSIS — B3781 Candidal esophagitis: Secondary | ICD-10-CM | POA: Diagnosis not present

## 2018-09-04 MED ORDER — GLUCOSE BLOOD VI STRP: ORAL_STRIP | 1 refills | Status: AC

## 2018-09-04 MED ORDER — TRAMADOL HCL 50 MG PO TABS: 50.0000 mg | ORAL_TABLET | Freq: Three times a day (TID) | ORAL | 0 refills | Status: AC | PRN

## 2018-09-04 NOTE — Progress Notes (Signed)
Location:  [facility] clinic Provider: Sheenah Dimitroff L. Mariea Clonts, D.O., C.M.D.  Code Status: full code Goals of Care:  Advanced Directives 08/27/2018  Does Patient Have a Medical Advance Directive? Yes  Type of Advance Directive Living will  Does patient want to make changes to medical advance directive? -  Copy of [place] in Chart? Yes  Would patient like information on creating a medical advance directive? -  Pre-existing out of facility DNR order (yellow form or pink MOST form) -   Chief Complaint  Patient presents with  . Transitions Of Care    08/22/2018 - 08/30/2018 (8 days)    HPI: Patient is a 77 y.o. female seen today for hospital follow-up s/p admission from 10/18-10/26/19 for left-sided abdominal pain, nausea, orthostatic hypotension.  She was found to have H pylori gastritis, candidal esophagitis both still being treated with antibiotics.  She was started on iron infusions for worsening anemia.  There were concerns for mesenteric ischemia, but she had not been on plavix long enough post-stenting of her renal arteries to undergo, but eventually did have EGD.  She has not had cscope; however.  Her iron deficiency was notably worse and she received iron infusions.  She now needs her cbc followed up.    Renal artery stenosis:  Was stented bilaterally, then BPs that were sky high have become orthostatic and soft.  BPs are better and stabilized per her daughter.  This morning's was ok.  Her carvedilol, lasix, hydralazine, hctz have all been stopped, as well as her meloxicam that she took for arthritis pain.    Feet are tingling and numb her and her right hand is numb.  Says probably from her diabetes.    She is lightheaded and weak.  She is throwing up when she eats.  Her daughter is staying on her to eat.  She did not vomit yesterday until last night.    She had a few days worth of fecal impaction after her first round of abx for the h pylori.  They got her bowels broke  loose.    Now if she's not throwing up, she has diarrhea.  She's been taking her metformin before meals instead of with them--counseled to move metformin with or just after to make sure it does not cause the diarrhea.  She's not eating much.  Her daughter is giving her ensure supplements.  She is eating some bland foods.  Weight is 136 lbs down from 154 lbs in September.  [facility]  She's very weak so using a cane to walk now.  Still refuses flu shots and her daughter reports that she doesn't take them either.  Discussed that shingrix is also recommended.  Past Medical History:  Diagnosis Date  . Abdominal pain, epigastric   . Acute upper respiratory infections of unspecified site   . Arthritis   . Benign paroxysmal positional vertigo   . CAD (coronary artery disease)    prior coronary stenting  . Candidiasis of vulva and vagina   . Carotid artery disease ([place]) 2001   s/p Bilateral CEA, after CVA  . Cervicalgia   . Decreased pedal pulses 06/04/2011   doppler - bilateral ABIs no evidence of insufficiency; L CIA slightly elevated velocities 20-30% diameter reduction;   . Dyspnea 03/13/2006   Myoview - EF 76%; mild ischemia in apical lateral region, less severe than previous study in 2002  . Edema   . H/O carotid  endarterectomy 03/06/2012   CT angio -high grade stenosis of proximal R internal carotid w/ lg posterior ulceration or dissection flap; near occlusive stenosis at origin of nondominantproximal L vertebral artery; <50% stenosis of proximal R vertebral and proximal L subclavian artery  . H/O endarterectomy 09/11/2012   patent L and R carotid sites, R 60-79% restenosed ICA; L <40% restenosed ICA  . H/O endarterectomy 01/08/2012   doppler - R distal common carotid/proximal ICA stenosed 80-99%, L 0-39%  . HTN (hypertension) 11/27/2012  . Hyperlipidemia   . Limb pain 05/02/2011   doppler - no evidence of thrombus of thrombophlebitis  . Long  term (current) use of anticoagulants   . Loss of weight   . Lumbago   . Myalgia and myositis, unspecified   . Myocardial infarction ([place])   . Nonspecific abnormal results of liver function study   . Nontoxic uninodular goiter   . Osteoarthrosis, unspecified whether generalized or localized, unspecified site   . Other malaise and fatigue   . Other manifestations of vitamin A deficiency   . PAF (paroxysmal atrial fibrillation) ([place]) 02/21/2011  . Pain in joint, pelvic region and thigh   . Pain in limb   . Peripheral vascular disease ([place])   . Phlebitis and thrombophlebitis of superficial vessels of lower extremities   . Primary localized osteoarthrosis, hand   . Pulmonary HTN ([place]) 12/03/2012  . Renal artery stenosis ([place])   . Routine gynecological examination   . Spinal stenosis, unspecified region other than cervical   . Stenosis of artery ([place]) 06/03/2012   doppler- most distal aspect of abd aorta no aneurysmal dilation; bilateral ABIs - mild L arterial insufficiency, L CIA narrowing w/ 50-69% diameter reduction; L and R SFA both w/ 0-49% diameter reductions  . Stroke ([place]) 2001  . Superficial vein thrombosis 05/11/2015   Left forehead  . Tobacco use disorder   . TR (tricuspid regurgitation), severe 12/02/12 for repair with CABG 12/03/2012  . Type II or unspecified type diabetes mellitus with peripheral circulatory disorders, not stated as uncontrolled(250.70)   . Unspecified cataract   . Unspecified disorder of iris and ciliary body   . Unspecified vitamin D deficiency   . Urinary tract infection, site not specified     Past Surgical History:  Procedure Laterality Date  . ABDOMINAL HYSTERECTOMY  1975  . ANGIOPLASTY  1984 and 1982  . APPENDECTOMY  1954  . ARCH AORTOGRAM  03/30/2011   40% recurrent stenosis at origin of R proximal carotid patch, shelf like recurrent stenosis w/in midsection of patch that does not appear to be flow limiting; normal non-stenosed great vessel origins    . BACK SURGERY  '84,'86, '87   three previous surgeries  . CAROTID ENDARTERECTOMY  2001   Bilateral  . CORONARY ARTERY BYPASS GRAFT  12/04/2012   Procedure: CORONARY ARTERY BYPASS GRAFTING (CABG);  Surgeon: Ivin Poot, MD; LIMA-LAD, SVG-OM, SVG-RCA  . ESOPHAGOGASTRODUODENOSCOPY (EGD) WITH PROPOFOL N/A 08/27/2018   Procedure: ESOPHAGOGASTRODUODENOSCOPY (EGD) WITH PROPOFOL;  Surgeon: Mauri Pole, MD;  Location: [facility] ENDOSCOPY;  Service: Endoscopy;  Laterality: N/A;  . INTRAOPERATIVE TRANSESOPHAGEAL ECHOCARDIOGRAM  12/04/2012   Procedure: INTRAOPERATIVE TRANSESOPHAGEAL ECHOCARDIOGRAM;  Surgeon: Ivin Poot, MD;  Location: [place];  Service: Open Heart Surgery;  Laterality: N/A;  . KIDNEY STONE SURGERY     Removal  . LEFT HEART CATHETERIZATION WITH CORONARY ANGIOGRAM N/A 11/28/2012   Procedure: LEFT HEART CATHETERIZATION WITH CORONARY ANGIOGRAM;  Surgeon: Leonie Man, MD;  Location: [facility] CATH  LAB;  Service: Cardiovascular;  Laterality: N/A;  . PERCUTANEOUS CORONARY STENT INTERVENTION (PCI-S)  2002   2 BMS in ostial/Prox & mid RCA (mid 2.5 mm 20x  mm, & 2.75 mm x  8 mm ostial)  . PERIPHERAL VASCULAR INTERVENTION  07/21/2018   Procedure: PERIPHERAL VASCULAR INTERVENTION;  Surgeon: Lorretta Harp, MD;  Location: [place] CV LAB;  Service: Cardiovascular;;  bilateral renal stents  . RENAL ANGIOGRAM N/A 11/30/2013   Procedure: RENAL ANGIOGRAM;  Surgeon: Lorretta Harp, MD;  Location: [facility] CATH LAB;  Service: Cardiovascular;  Laterality: N/A;  . RENAL ANGIOGRAPHY N/A 07/21/2018   Procedure: RENAL ANGIOGRAPHY;  Surgeon: Lorretta Harp, MD;  Location: [place] CV LAB;  Service: Cardiovascular;  Laterality: N/A;  . RIGHT HEART CATHETERIZATION N/A 12/02/2012   Procedure: RIGHT HEART CATH;  Surgeon: Troy Sine, MD;  Location: [facility] CATH LAB;  Service: Cardiovascular;  Laterality: N/A;  . TEE WITH CARDIOVERSION  01/23/2011   EF 50-93%; grade 2 diastolic dysfunction, elevated mean LA  filling pressure, RV systolic pressure increased consistent w/ mod pulmonary hypertension    Allergies  Allergen Reactions  . Iron Hives  . Vicodin [Hydrocodone-Acetaminophen] Other (See Comments)    Caused arrythmia   . Amiodarone Nausea And Vomiting  . Norvasc [Amlodipine Besylate] Nausea And Vomiting  . Promethazine Other (See Comments)    hallucinations  . Vioxx [Rofecoxib] Nausea And Vomiting    Outpatient Encounter Medications as of 09/04/2018  Medication Sig  . alendronate (FOSAMAX) 70 MG tablet Take 70 mg by mouth every Friday.   [name] aspirin 81 MG tablet Take 81 mg by mouth daily.   . bimatoprost (LUMIGAN) 0.01 % SOLN Place 1 drop into both eyes at bedtime.  . clopidogrel (PLAVIX) 75 MG tablet Take 1 tablet (75 mg total) by mouth daily with breakfast.  . dicyclomine (BENTYL) 10 MG capsule Take 3 times daily as needed for spasm. (Patient taking differently: Take 10 mg by mouth 3 (three) times daily as needed for spasms. )  . ezetimibe (ZETIA) 10 MG tablet Take 10 mg by mouth at bedtime.   . fluconazole (DIFLUCAN) 100 MG tablet Take 1 tablet (100 mg total) by mouth daily.  [name] levofloxacin (LEVAQUIN) 500 MG tablet Take 1 tablet (500 mg total) by mouth every other day for 10 days.  . metFORMIN (GLUCOPHAGE) 1000 MG tablet TAKE 1 TABLET TWICE A DAY WITH MEALS (Patient taking differently: Take 1,000 mg by mouth 2 (two) times daily with a meal. )  . metroNIDAZOLE (FLAGYL) 500 MG tablet Take 1 tablet (500 mg total) by mouth every 12 (twelve) hours for 11 days.  . nitroGLYCERIN (NITROSTAT) 0.4 MG SL tablet Place 1 tablet (0.4 mg total) under the tongue every 5 (five) minutes as needed for chest pain.  [name] omeprazole (PRILOSEC) 40 MG capsule Take 1 capsule (40 mg total) by mouth 2 (two) times daily.  . ondansetron (ZOFRAN ODT) 8 MG disintegrating tablet Take 1 tablet (8 mg total) by mouth every 8 (eight) hours as needed for nausea or vomiting. (Patient taking differently: Take 8 mg by mouth  every 6 (six) hours as needed for nausea or vomiting. )  . polyethylene glycol (MIRALAX) packet Take 17 g by mouth 2 (two) times daily.  . rosuvastatin (CRESTOR) 40 MG tablet Take one tablet by mouth once daily for cholesterol (Patient taking differently: Take 40 mg by mouth at bedtime. )  . traMADol (ULTRAM) 50 MG tablet Take 1 tablet (50 mg total)  by mouth every 8 (eight) hours as needed for up to 5 days (for pain.).   No facility-administered encounter medications on file as of 09/04/2018.     Review of Systems:  Review of Systems  Constitutional: Positive for malaise/fatigue and weight loss. Negative for chills, diaphoresis and fever.  HENT: Positive for sore throat. Negative for congestion and hearing loss.   Eyes: Negative for blurred vision.  Respiratory: Negative for cough and shortness of breath.   Cardiovascular: Negative for chest pain, palpitations and leg swelling.  Gastrointestinal: Positive for abdominal pain, diarrhea, nausea and vomiting. Negative for blood in stool, constipation, heartburn and melena.  Genitourinary: Negative for dysuria.  Musculoskeletal: Negative for falls and joint pain.  Skin: Negative for itching and rash.  Neurological: Positive for tingling and sensory change. Negative for dizziness and loss of consciousness.       Right hand and both feet numb, feet tingle  Endo/Heme/Allergies: Bruises/bleeds easily.  Psychiatric/Behavioral: Negative for depression and memory loss. The patient is not nervous/anxious and does not have insomnia.     Health Maintenance  Topic Date Due  . URINE MICROALBUMIN  04/15/2018  . OPHTHALMOLOGY EXAM  10/03/2018  . HEMOGLOBIN A1C  12/04/2018  . FOOT EXAM  01/24/2019  . TETANUS/TDAP  11/13/2021  . DEXA SCAN  Completed  . PNA vac Low Risk Adult  Completed  . INFLUENZA VACCINE  Discontinued    Physical Exam: Vitals:   09/04/18 1134  BP: 140/80  Pulse: (!) 52  Temp: 98 F (36.7 C)  TempSrc: Oral  SpO2: 96%    Weight: 136 lb (61.7 kg)  Height: 5\' 2"  (1.575 m)   Body mass index is 24.87 kg/m. Physical Exam  Constitutional: She is oriented to person, place, and time.  Moving slowly, weak, walking with cane, dark circles under eyes, pale  HENT:  Head: Normocephalic and atraumatic.  Neck: No JVD present.  Cardiovascular: Intact distal pulses.  irreg irreg  Pulmonary/Chest: Effort normal and breath sounds normal. No respiratory distress.  Abdominal: Soft. Bowel sounds are normal. She exhibits no distension and no mass. There is tenderness. There is no rebound and no guarding.  Left upper quadrant and epigastrium tender  Musculoskeletal: Normal range of motion.  Walking slowly with cane  Neurological: She is alert and oriented to person, place, and time.  Diminished sensation in feet and right hand  Skin: Skin is warm and dry.  Psychiatric: She has a normal mood and affect.    Labs reviewed: Basic Metabolic Panel: Recent Labs    07/15/18 1217  08/26/18 0532 08/27/18 0335 08/28/18 0343  NA 142   < > 131* 134* 133*  K 4.8   < > 4.0 3.9 3.8  CL 103   < > 105 105 105  CO2 22   < > 19* 22 25  GLUCOSE 81   < > 161* 100* 104*  BUN 23   < > 9 9 8   CREATININE 1.52*   < > 1.41* 1.52* 1.49*  CALCIUM 9.2   < > 7.9* 8.2* 8.3*  TSH 2.330  --   --   --   --    < > = values in this interval not displayed.   Liver Function Tests: Recent Labs    12/24/17 0824 06/03/18 0822 08/22/18 2319  AST 16 14 50*  ALT 7 6 28   ALKPHOS  --   --  60  BILITOT 0.5 0.7 0.8  PROT 6.5 6.1 6.5  ALBUMIN  --   --  2.7*   Recent Labs    08/22/18 2319  LIPASE 35   No results for input(s): AMMONIA in the last 8760 hours. CBC: Recent Labs    08/27/18 0335 08/28/18 0343 08/29/18 0511  WBC 4.5 4.6 4.4  NEUTROABS 2.8 2.7 2.6  HGB 7.9* 7.8* 8.0*  HCT 25.7* 25.3* 25.6*  MCV 76.5* 75.7* 75.7*  PLT 212 220 242   Lipid Panel: Recent Labs    12/24/17 0824 06/03/18 0822  CHOL 171 147  HDL 51 45*   LDLCALC 103* 86  TRIG 81 69  CHOLHDL 3.4 3.3   Lab Results  Component Value Date   HGBA1C 5.9 (H) 06/03/2018    Procedures since last visit: Reviewed hospital records  Assessment/Plan 1. Iron deficiency anemia, unspecified iron deficiency anemia type - hgb was down to 7.8 -received iron infusions -remains weak and dizzy but hard to know how much from this vs poor hydration and fluid intake - f/u lab: - CBC with Differential/Platelet  2. CKD (chronic kidney disease) stage 3, GFR 30-59 ml/min (HCC) - Avoid nephrotoxic agents like nsaids, dose adjust renally excreted meds, hydrate. - COMPLETE METABOLIC PANEL WITH GFR  3. Intercostal muscle strain, initial encounter - no longer has this, using tramadol for her abdominal pain now some, note it's constipating - traMADol (ULTRAM) 50 MG tablet; Take 1 tablet (50 mg total) by mouth every 8 (eight) hours as needed for up to 5 days (for pain.).  Dispense: 15 tablet; Refill: 0  4. Type 2 diabetes mellitus with stage 3 chronic kidney disease, without long-term current use of insulin (HCC) -  Lab Results  Component Value Date   HGBA1C 5.9 (H) 06/03/2018   - Hemoglobin A1c  5. Orthostatic hypotension -improved off meds -monitor bp as prior to stents pt was on 4 meds that lower bp and decrease fluid  6. History of stent insertion of renal artery -for #7 -maintain plavix at least for 3 mos--still needs cscope to r/o mesenteric ischemia and other causes of GI bleeding  7. Bilateral renal artery stenosis (HCC) -s/p bilateral stents   8. Paroxysmal atrial fibrillation (HCC) -irreg irreg today, on baby asa and plavix at this point due to stents and CAD, also likely contributing to weakness and lightheadedness  9.  H pylori gastritis -stil completing course of levaquin and flagyl -remains nauseous--may take zofran before each meal rather than spacing out by 8 hrs  10.  Candidal esophagitis -complete course of diflucan as  planned -if ongoing, add nystatin     meds were reconciled.  Labs/tests ordered:   Orders Placed This Encounter  Procedures  . CBC with Differential/Platelet  . COMPLETE METABOLIC PANEL WITH GFR  . Hemoglobin A1c    Next appt:  10/16/2018 6 wk f/u to make sure she's eating and maintaining her weight, feeling better  Tyreque Finken L. Ilay Capshaw, D.O. [facility] [address], [place] [address] Cell Phone (Mon-Fri 8am-5pm):  [phone] On Call:  [phone] & follow prompts after 5pm & weekends Office Phone:  [phone] Office Fax:  [phone]

## 2018-09-04 NOTE — Addendum Note (Signed)
Addended by: Despina Hidden on: 09/04/2018 01:10 PM   Modules accepted: Orders

## 2018-09-05 ENCOUNTER — Telehealth: Payer: Self-pay | Admitting: *Deleted

## 2018-09-05 LAB — CBC WITH DIFFERENTIAL/PLATELET
Basophils Absolute: 57 cells/uL (ref 0–200)
Basophils Relative: 0.7 %
Eosinophils Absolute: 599 cells/uL — ABNORMAL HIGH (ref 15–500)
Eosinophils Relative: 7.3 %
HCT: 35.3 % (ref 35.0–45.0)
Hemoglobin: 11 g/dL — ABNORMAL LOW (ref 11.7–15.5)
Lymphs Abs: 1123 cells/uL (ref 850–3900)
MCH: 24.5 pg — ABNORMAL LOW (ref 27.0–33.0)
MCHC: 31.2 g/dL — ABNORMAL LOW (ref 32.0–36.0)
MCV: 78.6 fL — ABNORMAL LOW (ref 80.0–100.0)
MPV: 9.2 fL (ref 7.5–12.5)
Monocytes Relative: 10.6 %
Neutro Abs: 5551 cells/uL (ref 1500–7800)
Neutrophils Relative %: 67.7 %
Platelets: 344 10*3/uL (ref 140–400)
RBC: 4.49 10*6/uL (ref 3.80–5.10)
RDW: 18.9 % — ABNORMAL HIGH (ref 11.0–15.0)
Total Lymphocyte: 13.7 %
WBC mixed population: 869 cells/uL (ref 200–950)
WBC: 8.2 10*3/uL (ref 3.8–10.8)

## 2018-09-05 LAB — COMPLETE METABOLIC PANEL WITH GFR
AG Ratio: 1.2 (calc) (ref 1.0–2.5)
ALT: 11 U/L (ref 6–29)
AST: 25 U/L (ref 10–35)
Albumin: 3.7 g/dL (ref 3.6–5.1)
Alkaline phosphatase (APISO): 61 U/L (ref 33–130)
BUN/Creatinine Ratio: 8 (calc) (ref 6–22)
BUN: 16 mg/dL (ref 7–25)
CO2: 28 mmol/L (ref 20–32)
Calcium: 9.7 mg/dL (ref 8.6–10.4)
Chloride: 94 mmol/L — ABNORMAL LOW (ref 98–110)
Creat: 1.89 mg/dL — ABNORMAL HIGH (ref 0.60–0.93)
GFR, Est African American: 29 mL/min/{1.73_m2} — ABNORMAL LOW (ref >=60)
GFR, Est Non African American: 25 mL/min/{1.73_m2} — ABNORMAL LOW (ref >=60)
Globulin: 3.1 g/dL (calc) (ref 1.9–3.7)
Glucose, Bld: 119 mg/dL (ref 65–139)
Potassium: 4.1 mmol/L (ref 3.5–5.3)
Sodium: 135 mmol/L (ref 135–146)
Total Bilirubin: 0.6 mg/dL (ref 0.2–1.2)
Total Protein: 6.8 g/dL (ref 6.1–8.1)

## 2018-09-05 LAB — HEMOGLOBIN A1C
Hgb A1c MFr Bld: 6.1 % of total Hgb — ABNORMAL HIGH (ref <5.7)
Mean Plasma Glucose: 128 (calc)
eAG (mmol/L): 7.1 (calc)

## 2018-09-05 NOTE — Telephone Encounter (Signed)
[facility] asking for [facility] therapy orders for 2 times a week for 4 weeks and 1 time a week for 5 weeks.  Per verbal from Dr. Mariea Clonts ok.

## 2018-09-07 ENCOUNTER — Encounter (HOSPITAL_COMMUNITY): Payer: Self-pay | Admitting: Emergency Medicine

## 2018-09-07 ENCOUNTER — Emergency Department (HOSPITAL_COMMUNITY): Payer: Medicare Other

## 2018-09-07 ENCOUNTER — Observation Stay (HOSPITAL_COMMUNITY)
Admission: EM | Admit: 2018-09-07 | Discharge: 2018-09-09 | Disposition: A | Discharge status: Home / Self Care | Payer: Medicare Other | Attending: Internal Medicine | Admitting: Internal Medicine

## 2018-09-07 DIAGNOSIS — E559 Vitamin D deficiency, unspecified: Secondary | ICD-10-CM | POA: Diagnosis not present

## 2018-09-07 DIAGNOSIS — N289 Disorder of kidney and ureter, unspecified: Secondary | ICD-10-CM | POA: Diagnosis not present

## 2018-09-07 DIAGNOSIS — I129 Hypertensive chronic kidney disease with stage 1 through stage 4 chronic kidney disease, or unspecified chronic kidney disease: Secondary | ICD-10-CM | POA: Diagnosis not present

## 2018-09-07 DIAGNOSIS — Z90710 Acquired absence of both cervix and uterus: Secondary | ICD-10-CM | POA: Insufficient documentation

## 2018-09-07 DIAGNOSIS — N183 Chronic kidney disease, stage 3 unspecified: Secondary | ICD-10-CM | POA: Diagnosis present

## 2018-09-07 DIAGNOSIS — D649 Anemia, unspecified: Secondary | ICD-10-CM | POA: Diagnosis not present

## 2018-09-07 DIAGNOSIS — Z86718 Personal history of other venous thrombosis and embolism: Secondary | ICD-10-CM | POA: Insufficient documentation

## 2018-09-07 DIAGNOSIS — Z9889 Other specified postprocedural states: Secondary | ICD-10-CM | POA: Insufficient documentation

## 2018-09-07 DIAGNOSIS — R0602 Shortness of breath: Secondary | ICD-10-CM | POA: Diagnosis not present

## 2018-09-07 DIAGNOSIS — E1151 Type 2 diabetes mellitus with diabetic peripheral angiopathy without gangrene: Secondary | ICD-10-CM | POA: Insufficient documentation

## 2018-09-07 DIAGNOSIS — R200 Anesthesia of skin: Secondary | ICD-10-CM | POA: Diagnosis not present

## 2018-09-07 DIAGNOSIS — Z7902 Long term (current) use of antithrombotics/antiplatelets: Secondary | ICD-10-CM | POA: Diagnosis not present

## 2018-09-07 DIAGNOSIS — I2510 Atherosclerotic heart disease of native coronary artery without angina pectoris: Secondary | ICD-10-CM | POA: Diagnosis present

## 2018-09-07 DIAGNOSIS — M1990 Unspecified osteoarthritis, unspecified site: Secondary | ICD-10-CM | POA: Diagnosis not present

## 2018-09-07 DIAGNOSIS — D509 Iron deficiency anemia, unspecified: Secondary | ICD-10-CM

## 2018-09-07 DIAGNOSIS — D320 Benign neoplasm of cerebral meninges: Secondary | ICD-10-CM | POA: Insufficient documentation

## 2018-09-07 DIAGNOSIS — Z951 Presence of aortocoronary bypass graft: Secondary | ICD-10-CM | POA: Insufficient documentation

## 2018-09-07 DIAGNOSIS — I480 Paroxysmal atrial fibrillation: Secondary | ICD-10-CM | POA: Insufficient documentation

## 2018-09-07 DIAGNOSIS — I701 Atherosclerosis of renal artery: Secondary | ICD-10-CM | POA: Diagnosis not present

## 2018-09-07 DIAGNOSIS — Z7901 Long term (current) use of anticoagulants: Secondary | ICD-10-CM | POA: Diagnosis not present

## 2018-09-07 DIAGNOSIS — M4802 Spinal stenosis, cervical region: Secondary | ICD-10-CM | POA: Diagnosis not present

## 2018-09-07 DIAGNOSIS — E785 Hyperlipidemia, unspecified: Secondary | ICD-10-CM | POA: Diagnosis not present

## 2018-09-07 DIAGNOSIS — Z7982 Long term (current) use of aspirin: Secondary | ICD-10-CM | POA: Diagnosis not present

## 2018-09-07 DIAGNOSIS — I6782 Cerebral ischemia: Secondary | ICD-10-CM | POA: Diagnosis not present

## 2018-09-07 DIAGNOSIS — I10 Essential (primary) hypertension: Secondary | ICD-10-CM | POA: Diagnosis not present

## 2018-09-07 DIAGNOSIS — R9082 White matter disease, unspecified: Secondary | ICD-10-CM | POA: Insufficient documentation

## 2018-09-07 DIAGNOSIS — I716 Thoracoabdominal aortic aneurysm, without rupture, unspecified: Secondary | ICD-10-CM | POA: Diagnosis present

## 2018-09-07 DIAGNOSIS — R079 Chest pain, unspecified: Principal | ICD-10-CM | POA: Insufficient documentation

## 2018-09-07 DIAGNOSIS — Z888 Allergy status to other drugs, medicaments and biological substances status: Secondary | ICD-10-CM | POA: Insufficient documentation

## 2018-09-07 DIAGNOSIS — E871 Hypo-osmolality and hyponatremia: Secondary | ICD-10-CM | POA: Diagnosis not present

## 2018-09-07 DIAGNOSIS — H8110 Benign paroxysmal vertigo, unspecified ear: Secondary | ICD-10-CM | POA: Diagnosis not present

## 2018-09-07 DIAGNOSIS — Z87891 Personal history of nicotine dependence: Secondary | ICD-10-CM | POA: Diagnosis not present

## 2018-09-07 DIAGNOSIS — Z8673 Personal history of transient ischemic attack (TIA), and cerebral infarction without residual deficits: Secondary | ICD-10-CM | POA: Insufficient documentation

## 2018-09-07 DIAGNOSIS — E1122 Type 2 diabetes mellitus with diabetic chronic kidney disease: Secondary | ICD-10-CM | POA: Insufficient documentation

## 2018-09-07 DIAGNOSIS — Z885 Allergy status to narcotic agent status: Secondary | ICD-10-CM | POA: Insufficient documentation

## 2018-09-07 DIAGNOSIS — Z8249 Family history of ischemic heart disease and other diseases of the circulatory system: Secondary | ICD-10-CM | POA: Insufficient documentation

## 2018-09-07 DIAGNOSIS — E1129 Type 2 diabetes mellitus with other diabetic kidney complication: Secondary | ICD-10-CM | POA: Diagnosis present

## 2018-09-07 DIAGNOSIS — Z823 Family history of stroke: Secondary | ICD-10-CM | POA: Insufficient documentation

## 2018-09-07 DIAGNOSIS — Z955 Presence of coronary angioplasty implant and graft: Secondary | ICD-10-CM | POA: Insufficient documentation

## 2018-09-07 DIAGNOSIS — R51 Headache: Secondary | ICD-10-CM | POA: Diagnosis not present

## 2018-09-07 DIAGNOSIS — I252 Old myocardial infarction: Secondary | ICD-10-CM | POA: Diagnosis not present

## 2018-09-07 DIAGNOSIS — Z79899 Other long term (current) drug therapy: Secondary | ICD-10-CM | POA: Insufficient documentation

## 2018-09-07 DIAGNOSIS — R0789 Other chest pain: Secondary | ICD-10-CM | POA: Diagnosis not present

## 2018-09-07 DIAGNOSIS — R42 Dizziness and giddiness: Secondary | ICD-10-CM | POA: Diagnosis not present

## 2018-09-07 DIAGNOSIS — Z7984 Long term (current) use of oral hypoglycemic drugs: Secondary | ICD-10-CM | POA: Insufficient documentation

## 2018-09-07 DIAGNOSIS — Z95820 Peripheral vascular angioplasty status with implants and grafts: Secondary | ICD-10-CM | POA: Insufficient documentation

## 2018-09-07 DIAGNOSIS — Z833 Family history of diabetes mellitus: Secondary | ICD-10-CM | POA: Insufficient documentation

## 2018-09-07 LAB — BASIC METABOLIC PANEL
Anion gap: 7 (ref 5–15)
BUN: 13 mg/dL (ref 8–23)
CHLORIDE: 97 mmol/L — AB (ref 98–111)
CO2: 27 mmol/L (ref 22–32)
CREATININE: 1.63 mg/dL — AB (ref 0.44–1.00)
Calcium: 9.2 mg/dL (ref 8.9–10.3)
GFR calc Af Amer: 33 mL/min — ABNORMAL LOW (ref >60)
GFR calc non Af Amer: 29 mL/min — ABNORMAL LOW (ref >60)
GLUCOSE: 103 mg/dL — AB (ref 70–99)
POTASSIUM: 4 mmol/L (ref 3.5–5.1)
Sodium: 131 mmol/L — ABNORMAL LOW (ref 135–145)

## 2018-09-07 LAB — I-STAT TROPONIN, ED: Troponin i, poc: 0.01 ng/mL (ref 0.00–0.08)

## 2018-09-07 LAB — CBC
HEMATOCRIT: 35.5 % — AB (ref 36.0–46.0)
HEMOGLOBIN: 11 g/dL — AB (ref 12.0–15.0)
MCH: 24.4 pg — AB (ref 26.0–34.0)
MCHC: 31 g/dL (ref 30.0–36.0)
MCV: 78.9 fL — AB (ref 80.0–100.0)
PLATELETS: 247 10*3/uL (ref 150–400)
RBC: 4.5 MIL/uL (ref 3.87–5.11)
RDW: 21.2 % — ABNORMAL HIGH (ref 11.5–15.5)
WBC: 7.6 10*3/uL (ref 4.0–10.5)
nRBC: 0 % (ref 0.0–0.2)

## 2018-09-07 MED ORDER — ALUM & MAG HYDROXIDE-SIMETH 200-200-20 MG/5ML PO SUSP
30.0000 mL | Freq: Once | ORAL | Status: AC
  Administered 2018-09-07: 30 mL via ORAL
  Filled 2018-09-07: qty 30

## 2018-09-07 MED ORDER — MORPHINE SULFATE (PF) 4 MG/ML IV SOLN
4.0000 mg | Freq: Once | INTRAVENOUS | Status: AC
  Administered 2018-09-07: 4 mg via INTRAVENOUS
  Filled 2018-09-07: qty 1

## 2018-09-07 NOTE — ED Triage Notes (Signed)
BIB EMS from home, pt reports L sided CP present since 1700. Radiation into L shoulder, also reports SOB. Pt states of of blood clots and cardiac hx. Given 3NTG en route.

## 2018-09-07 NOTE — ED Provider Notes (Signed)
[facility] EMERGENCY DEPARTMENT Provider Note   CSN: [id number] Arrival date & time: 09/07/18  2227     History   Chief Complaint Chief Complaint  Patient presents with  . Chest Pain    HPI Kendra Todd is a 77 y.o. female.  The history is provided by the patient.  Chest Pain    She has history of diabetes, hypertension, hyperlipidemia, coronary artery disease, stroke, paroxysmal atrial fibrillation, diastolic heart failure, aortic aneurysm, chronic kidney disease and comes in with left-sided chest pain since 5 PM.  Pain started after eating dinner.  She cannot characterize the pain but states it is not sharp.  Pain was rated at 8/10.  She took nitroglycerin at home with no relief.  She called for an ambulance who gave her 3 more nitroglycerin, and pain has reduced to 5/10.  Nothing makes it better, nothing makes it worse.  There is associated dyspnea.  She has been having nausea since being discharged from the hospital a week ago, but nausea was not any worse with this pain.  There was no diaphoresis.  She was recently hospitalized and diagnosed with H. pylori and is currently taking levofloxacin and metronidazole as well as omeprazole for that.  As a separate complaint, she has noted right arm weakness for the last week.  She has been on a hold to hold things in her right hand.  She is right-handed.  She denies any headache.  Past Medical History:  Diagnosis Date  . Abdominal pain, epigastric   . Acute upper respiratory infections of unspecified site   . Arthritis   . Benign paroxysmal positional vertigo   . CAD (coronary artery disease)    prior coronary stenting  . Candidiasis of vulva and vagina   . Carotid artery disease ([place]) 2001   s/p Bilateral CEA, after CVA  . Cervicalgia   . Decreased pedal pulses 06/04/2011   doppler - bilateral ABIs no evidence of insufficiency; L CIA slightly elevated velocities 20-30% diameter reduction;   . Dyspnea 03/13/2006   Myoview - EF 76%; mild ischemia in apical lateral region, less severe than previous study in 2002  . Edema   . H/O carotid endarterectomy 03/06/2012   CT angio -high grade stenosis of proximal R internal carotid w/ lg posterior ulceration or dissection flap; near occlusive stenosis at origin of nondominantproximal L vertebral artery; <50% stenosis of proximal R vertebral and proximal L subclavian artery  . H/O endarterectomy 09/11/2012   patent L and R carotid sites, R 60-79% restenosed ICA; L <40% restenosed ICA  . H/O endarterectomy 01/08/2012   doppler - R distal common carotid/proximal ICA stenosed 80-99%, L 0-39%  . HTN (hypertension) 11/27/2012  . Hyperlipidemia   . Limb pain 05/02/2011   doppler - no evidence of thrombus of thrombophlebitis  . Long term (current) use of anticoagulants   . Loss of weight   . Lumbago   . Myalgia and myositis, unspecified   . Myocardial infarction ([place])   . Nonspecific abnormal results of liver function study   . Nontoxic uninodular goiter   . Osteoarthrosis, unspecified whether generalized or localized, unspecified site   . Other malaise and fatigue   . Other manifestations of vitamin A deficiency   . PAF (paroxysmal atrial fibrillation) ([place]) 02/21/2011  . Pain in joint, pelvic region and thigh   . Pain in limb   . Peripheral vascular disease ([place])   . Phlebitis and thrombophlebitis of superficial vessels of lower  extremities   . Primary localized osteoarthrosis, hand   . Pulmonary HTN ([place]) 12/03/2012  . Renal artery stenosis ([place])   . Routine gynecological examination   . Spinal stenosis, unspecified region other than cervical   . Stenosis of artery ([place]) 06/03/2012   doppler- most distal aspect of abd aorta no aneurysmal dilation; bilateral ABIs - mild L arterial insufficiency, L CIA narrowing w/ 50-69% diameter reduction; L and R SFA both w/ 0-49% diameter reductions  . Stroke ([place]) 2001  . Superficial vein thrombosis 05/11/2015   Left forehead  .  Tobacco use disorder   . TR (tricuspid regurgitation), severe 12/02/12 for repair with CABG 12/03/2012  . Type II or unspecified type diabetes mellitus with peripheral circulatory disorders, not stated as uncontrolled(250.70)   . Unspecified cataract   . Unspecified disorder of iris and ciliary body   . Unspecified vitamin D deficiency   . Urinary tract infection, site not specified     Patient Active Problem List   Diagnosis Date Noted  . History of stent insertion of renal artery 09/04/2018  . Helicobacter pylori gastritis 09/04/2018  . Candidal esophagitis ([place]) 09/04/2018  . Abdominal pain   . H. pylori infection   . Nausea & vomiting 08/23/2018  . Orthostatic hypotension 08/23/2018  . LLQ abdominal pain 08/06/2018  . Long term (current) use of antithrombotics/antiplatelets 08/06/2018  . Other dysphagia 08/06/2018  . Other fatigue 07/10/2018  . Melena 07/10/2018  . Labile blood pressure 07/10/2018  . Renovascular hypertension 06/23/2018  . Pulmonary artery hypertension ([place]) 06/23/2018  . Aortic mural thrombus ([place]) 02/13/2018  . Thoracic back pain 02/05/2018  . Hypertensive urgency 02/03/2018  . Chest pain 02/03/2018  . Anemia 01/20/2018  . Sacroiliitis ([place]) 04/10/2017  . Pruritus 04/10/2017  . Edema 12/12/2016  . Chronic diastolic CHF (congestive heart failure), NYHA class 2 ([place]) 09/25/2016  . Thoracoabdominal aortic aneurysm (TAAA) without rupture ([place]) 02/27/2016  . CKD (chronic kidney disease) stage 3, GFR 30-59 ml/min (HCC) 02/27/2016  . Herpes zoster 05/25/2015  . Superficial vein thrombosis 05/11/2015  . Back pain 10/26/2014  . Osteoarthritis 10/26/2014  . AAA (abdominal aortic aneurysm) without rupture ([place]) 07/21/2014  . Pain in joint, shoulder region 01/20/2014  . Bilateral renal artery stenosis ([place]) 10/22/2013  . Palpitations 06/03/2013  . Atrial fibrillation ([place]) 04/25/2013  . TR (tricuspid regurgitation), severe 12/02/12 for repair with CABG 12/03/2012   . Pulmonary HTN ([place]) 12/03/2012  . NSTEMI (non-ST elevated myocardial infarction) ([place]) 11/27/2012  . PAD (peripheral artery disease) ([place]) 11/27/2012  . CAD (coronary artery disease): 1993 PTCA to circumflex.  2002 2 BMS stents to ostial, S/P CABG x 3 12/04/12 11/27/2012  . DM (diabetes mellitus), type 2 with renal complications ([place]) 05/04/1600  . HTN (hypertension) 11/27/2012  . Hypercholesterolemia 11/27/2012  . Bilateral carotid artery stenosis 02/14/2012    Past Surgical History:  Procedure Laterality Date  . ABDOMINAL HYSTERECTOMY  1975  . ANGIOPLASTY  1984 and 1982  . APPENDECTOMY  1954  . ARCH AORTOGRAM  03/30/2011   40% recurrent stenosis at origin of R proximal carotid patch, shelf like recurrent stenosis w/in midsection of patch that does not appear to be flow limiting; normal non-stenosed great vessel origins  . BACK SURGERY  '84,'86, '87   three previous surgeries  . CAROTID ENDARTERECTOMY  2001   Bilateral  . CORONARY ARTERY BYPASS GRAFT  12/04/2012   Procedure: CORONARY ARTERY BYPASS GRAFTING (CABG);  Surgeon: Ivin Poot, MD; LIMA-LAD, SVG-OM, SVG-RCA  .  ESOPHAGOGASTRODUODENOSCOPY (EGD) WITH PROPOFOL N/A 08/27/2018   Procedure: ESOPHAGOGASTRODUODENOSCOPY (EGD) WITH PROPOFOL;  Surgeon: Mauri Pole, MD;  Location: [facility] END[facility];  Service: Endoscopy;  Laterality: N/A;  . INTRAOPERATIVE TRANSESOPHAGEAL ECHOCARDIOGRAM  12/04/2012   Procedure: INTRAOPERATIVE TRANSESOPHAGEAL ECHOCARDIOGRAM;  Surgeon: Ivin Poot, MD;  Location: [facility];  Service: Open Heart Surgery;  Laterality: N/A;  . KIDNEY STONE SURGERY     Removal  . LEFT HEART CATHETERIZATION WITH CORONARY ANGIOGRAM N/A 11/28/2012   Procedure: LEFT HEART CATHETERIZATION WITH CORONARY ANGIOGRAM;  Surgeon: Leonie Man, MD;  Location: [facility] CATH LAB;  Service: Cardiovascular;  Laterality: N/A;  . PERCUTANEOUS CORONARY STENT INTERVENTION (PCI-S)  2002   2 BMS in ostial/Prox & mid RCA (mid 2.5 mm 20x  mm, & 2.75  mm x  8 mm ostial)  . PERIPHERAL VASCULAR INTERVENTION  07/21/2018   Procedure: PERIPHERAL VASCULAR INTERVENTION;  Surgeon: Lorretta Harp, MD;  Location: [facility] CV LAB;  Service: Cardiovascular;;  bilateral renal stents  . RENAL ANGIOGRAM N/A 11/30/2013   Procedure: RENAL ANGIOGRAM;  Surgeon: Lorretta Harp, MD;  Location: [facility] CATH LAB;  Service: Cardiovascular;  Laterality: N/A;  . RENAL ANGIOGRAPHY N/A 07/21/2018   Procedure: RENAL ANGIOGRAPHY;  Surgeon: Lorretta Harp, MD;  Location: [facility] CV LAB;  Service: Cardiovascular;  Laterality: N/A;  . RIGHT HEART CATHETERIZATION N/A 12/02/2012   Procedure: RIGHT HEART CATH;  Surgeon: Troy Sine, MD;  Location: [facility] CATH LAB;  Service: Cardiovascular;  Laterality: N/A;  . TEE WITH CARDIOVERSION  01/23/2011   EF 95-63%; grade 2 diastolic dysfunction, elevated mean LA filling pressure, RV systolic pressure increased consistent w/ mod pulmonary hypertension     OB History   None      Home Medications    Prior to Admission medications   Medication Sig Start Date End Date Taking? Authorizing Provider  alendronate (FOSAMAX) 70 MG tablet Take 70 mg by mouth every Friday.  07/13/16   [provider]  aspirin 81 MG tablet Take 81 mg by mouth daily.     [provider]  bimatoprost (LUMIGAN) 0.01 % SOLN Place 1 drop into both eyes at bedtime.    [provider]  clopidogrel (PLAVIX) 75 MG tablet Take 1 tablet (75 mg total) by mouth daily with breakfast. 07/23/18   Daune Perch, NP  dicyclomine (BENTYL) 10 MG capsule Take 3 times daily as needed for spasm. 08/05/18   Mansouraty, Telford Nab., MD  ezetimibe (ZETIA) 10 MG tablet Take 10 mg by mouth at bedtime.     [provider]  fluconazole (DIFLUCAN) 100 MG tablet Take 1 tablet (100 mg total) by mouth daily. 08/31/18   Elgergawy, Silver Huguenin, MD  glucose blood (ONETOUCH VERIO) test strip Use as instructed to test blood sugar twice daily DX: E11.29 09/04/18    Reed, Tiffany L, DO  levofloxacin (LEVAQUIN) 500 MG tablet Take 1 tablet (500 mg total) by mouth every other day for 10 days. 08/31/18 09/10/18  Elgergawy, Silver Huguenin, MD  metFORMIN (GLUCOPHAGE) 1000 MG tablet TAKE 1 TABLET TWICE A DAY WITH MEALS 08/02/16   Estill Dooms, MD  metroNIDAZOLE (FLAGYL) 500 MG tablet Take 1 tablet (500 mg total) by mouth every 12 (twelve) hours for 11 days. 08/30/18 09/10/18  Elgergawy, Silver Huguenin, MD  nitroGLYCERIN (NITROSTAT) 0.4 MG SL tablet Place 1 tablet (0.4 mg total) under the tongue every 5 (five) minutes as needed for chest pain. 01/31/18   Croitoru, Dani Gobble, MD  omeprazole (  PRILOSEC) 40 MG capsule Take 1 capsule (40 mg total) by mouth 2 (two) times daily. 08/11/18   Mansouraty, Telford Nab., MD  ondansetron (ZOFRAN ODT) 8 MG disintegrating tablet Take 1 tablet (8 mg total) by mouth every 8 (eight) hours as needed for nausea or vomiting. 08/22/18   Mansouraty, Telford Nab., MD  polyethylene glycol [facility]) packet Take 17 g by mouth 2 (two) times daily. 08/30/18   Elgergawy, Silver Huguenin, MD  rosuvastatin (CRESTOR) 40 MG tablet Take one tablet by mouth once daily for cholesterol 10/23/17   Reed, Tiffany L, DO  traMADol (ULTRAM) 50 MG tablet Take 1 tablet (50 mg total) by mouth every 8 (eight) hours as needed for up to 5 days (for pain.). 09/04/18 09/09/18  [name], DO    Family History Family History  Problem Relation Age of Onset  . Other Mother        CVA  . Diabetes Mother   . Heart disease Mother        Heart disease before age 74  . Heart attack Mother   . Heart disease Father        Heart Disease before age 31  . Heart attack Father   . Heart disease Sister        Amputation  . Diabetes Sister   . Heart disease Brother        Heart disease before age 71  . Diabetes Brother   . Stroke Brother   . Stroke Brother   . Heart disease Brother   . Stroke Brother   . Heart disease Brother   . Heart disease Sister   . Heart disease Sister   . Diabetes  Daughter   . Cancer Sister   . Diabetes Other   . Cancer Other        breast  . Heart disease Other        cad  . Other Other        cva  . Colon cancer Neg Hx   . Esophageal cancer Neg Hx   . Inflammatory bowel disease Neg Hx   . Liver disease Neg Hx   . Pancreatic cancer Neg Hx   . Rectal cancer Neg Hx   . Stomach cancer Neg Hx     Social History Social History   Tobacco Use  . Smoking status: Former Smoker    Packs/day: 0.30    Years: 57.00    Pack years: 17.10    Types: Cigarettes    Last attempt to quit: 11/19/2012    Years since quitting: 5.8  . Smokeless tobacco: Never Used  Substance Use Topics  . Alcohol use: Yes    Comment: rarely, 1 a month  . Drug use: No     Allergies   Iron; Vicodin [hydrocodone-acetaminophen]; Amiodarone; Norvasc [amlodipine besylate]; Promethazine; and Vioxx [rofecoxib]   Review of Systems Review of Systems  Cardiovascular: Positive for chest pain.  All other systems reviewed and are negative.    Physical Exam Updated Vital Signs BP (!) 182/91   Pulse 96   Resp (!) 22   Ht 5\' 2"  (1.575 m)   Wt 61.6 kg   SpO2 96%   BMI 24.84 kg/m   Physical Exam  Nursing note and vitals reviewed.  77 year old female, resting comfortably and in no acute distress. Vital signs are significant for elevated blood pressure and respiratory rate. Oxygen saturation is 96%, which is normal. Head is normocephalic and atraumatic. PERRLA, EOMI.  Oropharynx is clear. Neck is nontender and supple without adenopathy or JVD. Back is nontender and there is no CVA tenderness. Lungs are clear without rales, wheezes, or rhonchi. Chest is nontender. Heart has regular rate and rhythm without murmur. Abdomen is soft, flat, with mild epigastric tenderness.  There is no rebound or guarding.  There are no masses or hepatosplenomegaly and peristalsis is normoactive. Extremities have no cyanosis or edema, full range of motion is present. Skin is warm and dry  without rash. Neurologic: Mental status is normal, cranial nerves are intact.  There is right pronator drift.  Right arm weakness is noted with strength 4/5.  Strength in left arm and both legs is 5/5.  There are no sensory deficits.  ED Treatments / Results  Labs (all labs ordered are listed, but only abnormal results are displayed) Labs Reviewed  BASIC METABOLIC PANEL - Abnormal; Notable for the following components:      Result Value   Sodium 131 (*)    Chloride 97 (*)    Glucose, Bld 103 (*)    Creatinine, Ser 1.63 (*)    GFR calc non Af Amer 29 (*)    GFR calc Af Amer 33 (*)    All other components within normal limits  CBC - Abnormal; Notable for the following components:   Hemoglobin 11.0 (*)    HCT 35.5 (*)    MCV 78.9 (*)    MCH 24.4 (*)    RDW 21.2 (*)    All other components within normal limits  TROPONIN I  TROPONIN I  GLUCOSE, CAPILLARY  GLUCOSE, CAPILLARY  I-STAT TROPONIN, ED    EKG EKG Interpretation  Date/Time:  Sunday September 07 2018 23:03:04 EST Ventricular Rate:  97 PR Interval:    QRS Duration: 98 QT Interval:  366 QTC Calculation: 465 R Axis:   30 Text Interpretation:  Sinus rhythm Probable left ventricular hypertrophy Borderline T abnormalities, inferior leads When compared with ECG of 08/22/2018, QT has shortened Nonspecific T wave abnormality is now present Confirmed by Delora Fuel ([id number]) on 09/07/2018 11:05:50 PM   Radiology Dg Chest 2 View  Result Date: 09/07/2018 CLINICAL DATA:  Left chest pain since 1700 hours. Radiates to left shoulder. Shortness of breath. History of blood clots and cardiac history. EXAM: CHEST - 2 VIEW COMPARISON:  08/26/2018 FINDINGS: Postoperative changes in the mediastinum. Mild cardiac enlargement. No vascular congestion, edema, or consolidation in the lungs. No blunting of costophrenic angles. Tortuous and dilated aorta. IMPRESSION: Mild cardiac enlargement. No evidence of active pulmonary disease. Tortuous and  dilated aorta. Electronically Signed   By: Lucienne Capers M.D.   On: 09/07/2018 23:39   Ct Head Wo Contrast  Result Date: 09/08/2018 CLINICAL DATA:  Generalized weakness. Headache. EXAM: CT HEAD WITHOUT CONTRAST TECHNIQUE: Contiguous axial images were obtained from the base of the skull through the vertex without intravenous contrast. COMPARISON:  Neck CTA 03/06/2012. FINDINGS: Brain: There is no evidence of acute infarct, intracranial hemorrhage, midline shift, or extra-axial fluid collection. A chronic lacunar infarct is noted in the right caudate nucleus. There is also a small chronic posterior right frontal cortical infarct. Cerebral white matter hypodensities are nonspecific but compatible with mild chronic small vessel ischemic disease. There is mild cerebral atrophy. An 8 mm extra-axial mass is noted in the right frontoparietal region without mass effect on the adjacent brain. Vascular: Calcified atherosclerosis at the skull base. No hyperdense vessel. Partially visualized right ICA atherosclerosis and mild dilatation in the neck with  history of endarterectomy and dissects in flat shown on the prior CTA. Skull: No fracture or focal osseous lesion. Sinuses/Orbits: Paranasal sinuses and mastoid air cells are clear. Right cataract extraction. Other: None. IMPRESSION: 1. No evidence of acute intracranial abnormality. 2. Mild chronic small vessel ischemic disease and chronic infarcts as above. 3. 8 mm right frontoparietal extra-axial mass, likely an incidental meningioma. Electronically Signed   By: Logan Bores M.D.   On: 09/08/2018 00:51    Procedures Procedures  Medications Ordered in ED Medications  latanoprost (XALATAN) 0.005 % ophthalmic solution 1 drop (1 drop Both Eyes Given 09/08/18 0403)  clopidogrel (PLAVIX) tablet 75 mg (has no administration in time range)  dicyclomine (BENTYL) capsule 10 mg (has no administration in time range)  metroNIDAZOLE (FLAGYL) tablet 500 mg (has no  administration in time range)  levofloxacin (LEVAQUIN) tablet 500 mg (has no administration in time range)  polyethylene glycol (MIRALAX / GLYCOLAX) packet 17 g (17 g Oral Not Given 09/08/18 0254)  rosuvastatin (CRESTOR) tablet 40 mg (has no administration in time range)  pantoprazole (PROTONIX) EC tablet 80 mg (80 mg Oral Given 09/08/18 0202)  fluconazole (DIFLUCAN) tablet 100 mg (has no administration in time range)  ezetimibe (ZETIA) tablet 10 mg (has no administration in time range)  acetaminophen (TYLENOL) tablet 650 mg (650 mg Oral Given 09/08/18 0822)  ondansetron (ZOFRAN) injection 4 mg (has no administration in time range)  enoxaparin (LOVENOX) injection 40 mg (40 mg Subcutaneous Given 09/08/18 0402)  morphine 2 MG/ML injection 2 mg (has no administration in time range)  carvedilol (COREG) tablet 12.5 mg (has no administration in time range)  insulin aspart (novoLOG) injection 0-9 Units (0 Units Subcutaneous Not Given 09/08/18 0752)  hydrALAZINE (APRESOLINE) injection 5 mg (5 mg Intravenous Not Given 09/08/18 0540)  alum & mag hydroxide-simeth (MAALOX/MYLANTA) 200-200-20 MG/5ML suspension 30 mL (30 mLs Oral Given 09/07/18 2350)  morphine 4 MG/ML injection 4 mg (4 mg Intravenous Given 09/07/18 2354)  sodium chloride 0.9 % bolus 500 mL (0 mLs Intravenous Stopped 09/08/18 0252)  alum & mag hydroxide-simeth (MAALOX/MYLANTA) 200-200-20 MG/5ML suspension 30 mL (30 mLs Oral Given 09/08/18 0204)    And  lidocaine (XYLOCAINE) 2 % viscous mouth solution 15 mL (15 mLs Oral Given 09/08/18 0204)     Initial Impression / Assessment and Plan / ED Course  I have reviewed the triage vital signs and the nursing notes.  Pertinent labs & imaging results that were available during my care of the patient were reviewed by me and considered in my medical decision making (see chart for details).  Chest pain of uncertain cause.  She has known H. pylori disease which is not completely treated, and some aspects of  this pain seem more consistent with GI origin.  However, she also has known coronary disease.  ECG does show new T wave flattening to slight inversion in the inferior leads.  She will be given a dose of Maalox and also morphine for pain.  Elevated blood pressure.  Patient had problems with orthostatic hypotension at recent hospitalization, and her hypertensive medications were discontinued.  Right arm weakness suspicious for stroke.  She will be sent for CT scan to evaluate this, may need MRI if CT is not definitive.  Old records are reviewed confirming recent hospitalization for orthostatic dizziness and complicated hospitalization which included discovery of H. pylori infection and acute kidney injury.  CT scan shows no evidence of stroke.  She will need a full set of serial troponins  to rule out ACS given new T wave changes.  Case is discussed with Dr. Alcario Drought of [facility] hospitalist, who agrees to admit the patient.  Final Clinical Impressions(s) / ED Diagnoses   Final diagnoses:  Nonspecific chest pain  Renal insufficiency  Microcytic anemia  Hyponatremia    ED Discharge Orders    None       Delora Fuel, MD 50/38/88 [phone]

## 2018-09-08 ENCOUNTER — Other Ambulatory Visit: Payer: Self-pay

## 2018-09-08 ENCOUNTER — Emergency Department (HOSPITAL_COMMUNITY): Payer: Medicare Other

## 2018-09-08 ENCOUNTER — Observation Stay (HOSPITAL_BASED_OUTPATIENT_CLINIC_OR_DEPARTMENT_OTHER): Payer: Medicare Other

## 2018-09-08 ENCOUNTER — Encounter (HOSPITAL_COMMUNITY): Payer: Self-pay | Admitting: Cardiology

## 2018-09-08 ENCOUNTER — Observation Stay (HOSPITAL_COMMUNITY): Payer: Medicare Other

## 2018-09-08 DIAGNOSIS — I716 Thoracoabdominal aortic aneurysm, without rupture: Secondary | ICD-10-CM

## 2018-09-08 DIAGNOSIS — I2510 Atherosclerotic heart disease of native coronary artery without angina pectoris: Secondary | ICD-10-CM

## 2018-09-08 DIAGNOSIS — N183 Chronic kidney disease, stage 3 (moderate): Secondary | ICD-10-CM

## 2018-09-08 DIAGNOSIS — Z9889 Other specified postprocedural states: Secondary | ICD-10-CM | POA: Diagnosis not present

## 2018-09-08 DIAGNOSIS — E1122 Type 2 diabetes mellitus with diabetic chronic kidney disease: Secondary | ICD-10-CM

## 2018-09-08 DIAGNOSIS — I10 Essential (primary) hypertension: Secondary | ICD-10-CM

## 2018-09-08 DIAGNOSIS — I25118 Atherosclerotic heart disease of native coronary artery with other forms of angina pectoris: Secondary | ICD-10-CM | POA: Diagnosis not present

## 2018-09-08 DIAGNOSIS — R079 Chest pain, unspecified: Secondary | ICD-10-CM | POA: Diagnosis present

## 2018-09-08 DIAGNOSIS — R51 Headache: Secondary | ICD-10-CM | POA: Diagnosis not present

## 2018-09-08 DIAGNOSIS — D320 Benign neoplasm of cerebral meninges: Secondary | ICD-10-CM | POA: Diagnosis not present

## 2018-09-08 LAB — NM MYOCAR MULTI W/SPECT W/WALL MOTION / EF
CHL CUP RESTING HR STRESS: 76 {beats}/min
CSEPEDS: 1 s
CSEPHR: 86 %
Estimated workload: 1 METS
Exercise duration (min): 5 min
MPHR: 141 {beats}/min
Peak HR: 122 {beats}/min

## 2018-09-08 LAB — GLUCOSE, CAPILLARY
GLUCOSE-CAPILLARY: 87 mg/dL (ref 70–99)
GLUCOSE-CAPILLARY: 83 mg/dL (ref 70–99)
GLUCOSE-CAPILLARY: 107 mg/dL — AB (ref 70–99)
GLUCOSE-CAPILLARY: 176 mg/dL — AB (ref 70–99)
Glucose-Capillary: 94 mg/dL (ref 70–99)

## 2018-09-08 LAB — TROPONIN I
Troponin I: <0.03 ng/mL (ref <0.03)
Troponin I: <0.03 ng/mL (ref <0.03)

## 2018-09-08 MED ORDER — METRONIDAZOLE 500 MG PO TABS
500.0000 mg | ORAL_TABLET | Freq: Two times a day (BID) | ORAL | Status: DC
  Administered 2018-09-08 – 2018-09-09 (×2): 500 mg via ORAL
  Filled 2018-09-08 (×3): qty 1

## 2018-09-08 MED ORDER — TECHNETIUM TC 99M TETROFOSMIN IV KIT
10.0000 | PACK | Freq: Once | INTRAVENOUS | Status: AC | PRN
  Administered 2018-09-08: 10 via INTRAVENOUS

## 2018-09-08 MED ORDER — LEVOFLOXACIN 500 MG PO TABS
500.0000 mg | ORAL_TABLET | ORAL | Status: AC
  Administered 2018-09-09: 500 mg via ORAL
  Filled 2018-09-08: qty 1

## 2018-09-08 MED ORDER — MORPHINE SULFATE (PF) 2 MG/ML IV SOLN: 2.0000 mg | INTRAVENOUS | Status: DC | PRN

## 2018-09-08 MED ORDER — PANTOPRAZOLE SODIUM 40 MG PO TBEC
80.0000 mg | DELAYED_RELEASE_TABLET | Freq: Two times a day (BID) | ORAL | Status: DC
  Administered 2018-09-08 – 2018-09-09 (×3): 80 mg via ORAL
  Filled 2018-09-08 (×4): qty 2

## 2018-09-08 MED ORDER — ALUM & MAG HYDROXIDE-SIMETH 200-200-20 MG/5ML PO SUSP
30.0000 mL | Freq: Once | ORAL | Status: AC
  Administered 2018-09-08: 30 mL via ORAL
  Filled 2018-09-08: qty 30

## 2018-09-08 MED ORDER — SODIUM CHLORIDE 0.9 % IV BOLUS
500.0000 mL | Freq: Once | INTRAVENOUS | Status: AC
  Administered 2018-09-08: 500 mL via INTRAVENOUS

## 2018-09-08 MED ORDER — ADULT MULTIVITAMIN W/MINERALS CH
1.0000 | ORAL_TABLET | Freq: Every day | ORAL | Status: DC
  Administered 2018-09-09: 1 via ORAL
  Filled 2018-09-08: qty 1

## 2018-09-08 MED ORDER — INSULIN ASPART 100 UNIT/ML ~~LOC~~ SOLN
0.0000 [IU] | SUBCUTANEOUS | Status: DC
  Administered 2018-09-08: 2 [IU] via SUBCUTANEOUS
  Administered 2018-09-09 (×2): 1 [IU] via SUBCUTANEOUS

## 2018-09-08 MED ORDER — REGADENOSON 0.4 MG/5ML IV SOLN
0.4000 mg | Freq: Once | INTRAVENOUS | Status: AC
  Administered 2018-09-08: 0.4 mg via INTRAVENOUS
  Filled 2018-09-08: qty 5

## 2018-09-08 MED ORDER — CARVEDILOL 12.5 MG PO TABS
12.5000 mg | ORAL_TABLET | Freq: Two times a day (BID) | ORAL | Status: DC
  Administered 2018-09-08 – 2018-09-09 (×3): 12.5 mg via ORAL
  Filled 2018-09-08 (×3): qty 1

## 2018-09-08 MED ORDER — EZETIMIBE 10 MG PO TABS
10.0000 mg | ORAL_TABLET | Freq: Every day | ORAL | Status: DC
  Administered 2018-09-08: 10 mg via ORAL
  Filled 2018-09-08: qty 1

## 2018-09-08 MED ORDER — CLOPIDOGREL BISULFATE 75 MG PO TABS
75.0000 mg | ORAL_TABLET | Freq: Every day | ORAL | Status: DC
  Administered 2018-09-09: 75 mg via ORAL
  Filled 2018-09-08 (×2): qty 1

## 2018-09-08 MED ORDER — REGADENOSON 0.4 MG/5ML IV SOLN
INTRAVENOUS | Status: AC
  Administered 2018-09-08: 0.4 mg via INTRAVENOUS
  Filled 2018-09-08: qty 5

## 2018-09-08 MED ORDER — FLUCONAZOLE 100 MG PO TABS
100.0000 mg | ORAL_TABLET | Freq: Every day | ORAL | Status: AC
  Administered 2018-09-08 – 2018-09-09 (×2): 100 mg via ORAL
  Filled 2018-09-08 (×2): qty 1

## 2018-09-08 MED ORDER — DICYCLOMINE HCL 10 MG PO CAPS: 10.0000 mg | ORAL_CAPSULE | Freq: Three times a day (TID) | ORAL | Status: DC | PRN

## 2018-09-08 MED ORDER — LIDOCAINE VISCOUS HCL 2 % MT SOLN
15.0000 mL | Freq: Once | OROMUCOSAL | Status: AC
  Administered 2018-09-08: 15 mL via ORAL
  Filled 2018-09-08: qty 15

## 2018-09-08 MED ORDER — POLYETHYLENE GLYCOL 3350 17 G PO PACK
17.0000 g | PACK | Freq: Two times a day (BID) | ORAL | Status: DC
  Filled 2018-09-08: qty 1

## 2018-09-08 MED ORDER — ENOXAPARIN SODIUM 40 MG/0.4ML ~~LOC~~ SOLN
40.0000 mg | Freq: Every day | SUBCUTANEOUS | Status: DC
  Administered 2018-09-08 (×2): 40 mg via SUBCUTANEOUS
  Filled 2018-09-08 (×2): qty 0.4

## 2018-09-08 MED ORDER — TECHNETIUM TC 99M TETROFOSMIN IV KIT
30.0000 | PACK | Freq: Once | INTRAVENOUS | Status: AC | PRN
  Administered 2018-09-08: 30 via INTRAVENOUS

## 2018-09-08 MED ORDER — ACETAMINOPHEN 325 MG PO TABS
650.0000 mg | ORAL_TABLET | ORAL | Status: DC | PRN
  Administered 2018-09-08 (×2): 650 mg via ORAL
  Filled 2018-09-08 (×2): qty 2

## 2018-09-08 MED ORDER — HYDRALAZINE HCL 20 MG/ML IJ SOLN
5.0000 mg | Freq: Once | INTRAMUSCULAR | Status: DC
  Filled 2018-09-08: qty 1

## 2018-09-08 MED ORDER — LATANOPROST 0.005 % OP SOLN
1.0000 [drp] | Freq: Every day | OPHTHALMIC | Status: DC
  Administered 2018-09-08 (×2): 1 [drp] via OPHTHALMIC
  Filled 2018-09-08: qty 2.5

## 2018-09-08 MED ORDER — ONDANSETRON HCL 4 MG/2ML IJ SOLN: 4.0000 mg | Freq: Four times a day (QID) | INTRAMUSCULAR | Status: DC | PRN

## 2018-09-08 MED ORDER — ROSUVASTATIN CALCIUM 40 MG PO TABS
40.0000 mg | ORAL_TABLET | Freq: Every day | ORAL | Status: DC
  Administered 2018-09-08 – 2018-09-09 (×2): 40 mg via ORAL
  Filled 2018-09-08 (×2): qty 1

## 2018-09-08 MED ORDER — ENSURE ENLIVE PO LIQD
237.0000 mL | Freq: Three times a day (TID) | ORAL | Status: DC
  Administered 2018-09-09: 237 mL via ORAL

## 2018-09-08 NOTE — Progress Notes (Signed)
See H & P dated 09/08/18   In short  Pt with hx of HTN, RAS s/p stents, CAD s/p stents PAF, DM, stroke admitted 09/08/18 after presenting to ER 09/07/18 with Lt sided chest pain.  BP at home was 451 systolic, presented by EMS and 3 NTG given en route.  Pain did radiate in to lt shoulder and she did have SOB.  She did have some relief with the NTG.  Then with GI cocktail and morphine. Of note recently discharged and anti-hypertensive meds stopped due to hypotension in hospital as well as orthostatic hypotension.    PE Gen: awake alert in no acute distress CV RRR no mgr no LE edema Resp: no increased work of breathing. BS clear   Plan  1. Chest pain- no pressure but pain. Improved this am.  neg MI. evlauted by cardiology who opine possible BP elevated causing pain. Recommended lexiscan myoview,  2. Hx CAD and CABG in 2014-- last nuc prior to CABG see above. Continue home meds 3. Thoracoabdominal aortic aneurysm slightly larger on last CT angio 4. CKD III  Appears stable 5. Iron def anemia on plavix due to recent renal stents. 6. Hx GI bleed. No s/sx bleeding. Continue plavix 7. bil renal artery stenosis with stents placed 07/21/18 monitor urine output 8. PAF currently in SR home meds include plavix 9. HTN. Fair control. Continue home med 10. HLD on crestor and zetia last LDL 86 11. PAD with CEAs and Lt subclavian artery stensosis - asymptomatic.  12. Wt loss due to possible mesenteric ischemia 13. DM2 A1c 6.1 4 days ago. On oral agents. Serum glucose 103. Will use SSI for optimal control.     Santiago Glad m. Black, NP

## 2018-09-08 NOTE — H&P (Signed)
History and Physical    Kendra Todd ZJQ:[id number] DOB: Aug 05, 1941 DOA: 09/07/2018  PCP: [name], DO  Patient coming from: Home  I have personally briefly reviewed patient's old medical records in [place]  Chief Complaint: CP  HPI: Kendra Todd is a 77 y.o. female with medical history significant of RAS s/p stents in Sept, CAD s/p stents and CABG 2014, HLD, PAF, DM2, HTN, Stroke, TAAA.  Patient recently admitted to our service for LLQ abd pain, unclear etiology, concerning for chronic ischemia.  Did have esophageal candidiasis that admit and was started on treatment for this and H.Pylori on discharge for 10 days remaining on 10/24 (2 days remaining).  During that admit she was noted to have orthostatic hypotension and all BP meds (HCTZ, coreg, lasix, hydralazine) were stopped.  Since discharge she had been feeling better, until today when she developed L sided CP onset at 1700, radiation into L shoulder.  Associated SOB.  Partial relief (from 8 to 5) with NTG en route.   Also having R handed numbness for past week.   ED Course: Trop neg, BP 240-973 systolic.  Partial relief with GI cocktail and morphine (given at same time unfortunately).  CT head just seems to show an incidental meningioma.  Review of Systems: As per HPI otherwise 10 point review of systems negative.   Past Medical History:  Diagnosis Date  . Abdominal pain, epigastric   . Acute upper respiratory infections of unspecified site   . Arthritis   . Benign paroxysmal positional vertigo   . CAD (coronary artery disease)    prior coronary stenting  . Candidiasis of vulva and vagina   . Carotid artery disease ([place]) 2001   s/p Bilateral CEA, after CVA  . Cervicalgia   . Decreased pedal pulses 06/04/2011   doppler - bilateral ABIs no evidence of insufficiency; L CIA slightly elevated velocities 20-30% diameter reduction;   . Dyspnea 03/13/2006   Myoview - EF 76%; mild ischemia in apical lateral region,  less severe than previous study in 2002  . Edema   . H/O carotid endarterectomy 03/06/2012   CT angio -high grade stenosis of proximal R internal carotid w/ lg posterior ulceration or dissection flap; near occlusive stenosis at origin of nondominantproximal L vertebral artery; <50% stenosis of proximal R vertebral and proximal L subclavian artery  . H/O endarterectomy 09/11/2012   patent L and R carotid sites, R 60-79% restenosed ICA; L <40% restenosed ICA  . H/O endarterectomy 01/08/2012   doppler - R distal common carotid/proximal ICA stenosed 80-99%, L 0-39%  . HTN (hypertension) 11/27/2012  . Hyperlipidemia   . Limb pain 05/02/2011   doppler - no evidence of thrombus of thrombophlebitis  . Long term (current) use of anticoagulants   . Loss of weight   . Lumbago   . Myalgia and myositis, unspecified   . Myocardial infarction ([place])   . Nonspecific abnormal results of liver function study   . Nontoxic uninodular goiter   . Osteoarthrosis, unspecified whether generalized or localized, unspecified site   . Other malaise and fatigue   . Other manifestations of vitamin A deficiency   . PAF (paroxysmal atrial fibrillation) ([place]) 02/21/2011  . Pain in joint, pelvic region and thigh   . Pain in limb   . Peripheral vascular disease ([place])   . Phlebitis and thrombophlebitis of superficial vessels of lower extremities   . Primary localized osteoarthrosis, hand   . Pulmonary HTN ([place]) 12/03/2012  .  Renal artery stenosis ([place])   . Routine gynecological examination   . Spinal stenosis, unspecified region other than cervical   . Stenosis of artery ([place]) 06/03/2012   doppler- most distal aspect of abd aorta no aneurysmal dilation; bilateral ABIs - mild L arterial insufficiency, L CIA narrowing w/ 50-69% diameter reduction; L and R SFA both w/ 0-49% diameter reductions  . Stroke ([place]) 2001  . Superficial vein thrombosis 05/11/2015   Left forehead  . Tobacco use disorder   . TR (tricuspid regurgitation),  severe 12/02/12 for repair with CABG 12/03/2012  . Type II or unspecified type diabetes mellitus with peripheral circulatory disorders, not stated as uncontrolled(250.70)   . Unspecified cataract   . Unspecified disorder of iris and ciliary body   . Unspecified vitamin D deficiency   . Urinary tract infection, site not specified     Past Surgical History:  Procedure Laterality Date  . ABDOMINAL HYSTERECTOMY  1975  . ANGIOPLASTY  1984 and 1982  . APPENDECTOMY  1954  . ARCH AORTOGRAM  03/30/2011   40% recurrent stenosis at origin of R proximal carotid patch, shelf like recurrent stenosis w/in midsection of patch that does not appear to be flow limiting; normal non-stenosed great vessel origins  . BACK SURGERY  '84,'86, '87   three previous surgeries  . CAROTID ENDARTERECTOMY  2001   Bilateral  . CORONARY ARTERY BYPASS GRAFT  12/04/2012   Procedure: CORONARY ARTERY BYPASS GRAFTING (CABG);  Surgeon: Ivin Poot, MD; LIMA-LAD, SVG-OM, SVG-RCA  . ESOPHAGOGASTRODUODENOSCOPY (EGD) WITH PROPOFOL N/A 08/27/2018   Procedure: ESOPHAGOGASTRODUODENOSCOPY (EGD) WITH PROPOFOL;  Surgeon: Mauri Pole, MD;  Location: [facility];  Service: Endoscopy;  Laterality: N/A;  . INTRAOPERATIVE TRANSESOPHAGEAL ECHOCARDIOGRAM  12/04/2012   Procedure: INTRAOPERATIVE TRANSESOPHAGEAL ECHOCARDIOGRAM;  Surgeon: Ivin Poot, MD;  Location: [place];  Service: Open Heart Surgery;  Laterality: N/A;  . KIDNEY STONE SURGERY     Removal  . LEFT HEART CATHETERIZATION WITH CORONARY ANGIOGRAM N/A 11/28/2012   Procedure: LEFT HEART CATHETERIZATION WITH CORONARY ANGIOGRAM;  Surgeon: Leonie Man, MD;  Location: [facility] CATH LAB;  Service: Cardiovascular;  Laterality: N/A;  . PERCUTANEOUS CORONARY STENT INTERVENTION (PCI-S)  2002   2 BMS in ostial/Prox & mid RCA (mid 2.5 mm 20x  mm, & 2.75 mm x  8 mm ostial)  . PERIPHERAL VASCULAR INTERVENTION  07/21/2018   Procedure: PERIPHERAL VASCULAR INTERVENTION;  Surgeon: Lorretta Harp, MD;  Location: [place] CV LAB;  Service: Cardiovascular;;  bilateral renal stents  . RENAL ANGIOGRAM N/A 11/30/2013   Procedure: RENAL ANGIOGRAM;  Surgeon: Lorretta Harp, MD;  Location: [facility] CATH LAB;  Service: Cardiovascular;  Laterality: N/A;  . RENAL ANGIOGRAPHY N/A 07/21/2018   Procedure: RENAL ANGIOGRAPHY;  Surgeon: Lorretta Harp, MD;  Location: [place] CV LAB;  Service: Cardiovascular;  Laterality: N/A;  . RIGHT HEART CATHETERIZATION N/A 12/02/2012   Procedure: RIGHT HEART CATH;  Surgeon: Troy Sine, MD;  Location: [facility] CATH LAB;  Service: Cardiovascular;  Laterality: N/A;  . TEE WITH CARDIOVERSION  01/23/2011   EF 25-42%; grade 2 diastolic dysfunction, elevated mean LA filling pressure, RV systolic pressure increased consistent w/ mod pulmonary hypertension     reports that she quit smoking about 5 years ago. Her smoking use included cigarettes. She has a 17.10 pack-year smoking history. She has never used smokeless tobacco. She reports that she drinks alcohol. She reports that she does not use drugs.  Allergies  Allergen Reactions  .  Iron Hives  . Vicodin [Hydrocodone-Acetaminophen] Other (See Comments)    Caused arrythmia   . Amiodarone Nausea And Vomiting  . Norvasc [Amlodipine Besylate] Nausea And Vomiting  . Promethazine Other (See Comments)    hallucinations  . Vioxx [Rofecoxib] Nausea And Vomiting    Family History  Problem Relation Age of Onset  . Other Mother        CVA  . Diabetes Mother   . Heart disease Mother        Heart disease before age 4  . Heart attack Mother   . Heart disease Father        Heart Disease before age 53  . Heart attack Father   . Heart disease Sister        Amputation  . Diabetes Sister   . Heart disease Brother        Heart disease before age 23  . Diabetes Brother   . Stroke Brother   . Stroke Brother   . Heart disease Brother   . Stroke Brother   . Heart disease Brother   . Heart disease Sister   .  Heart disease Sister   . Diabetes Daughter   . Cancer Sister   . Diabetes Other   . Cancer Other        breast  . Heart disease Other        cad  . Other Other        cva  . Colon cancer Neg Hx   . Esophageal cancer Neg Hx   . Inflammatory bowel disease Neg Hx   . Liver disease Neg Hx   . Pancreatic cancer Neg Hx   . Rectal cancer Neg Hx   . Stomach cancer Neg Hx      Prior to Admission medications   Medication Sig Start Date End Date Taking? Authorizing Provider  alendronate (FOSAMAX) 70 MG tablet Take 70 mg by mouth every Friday.  07/13/16  Yes [provider]  bimatoprost (LUMIGAN) 0.01 % SOLN Place 1 drop into both eyes at bedtime.   Yes [provider]  clopidogrel (PLAVIX) 75 MG tablet Take 1 tablet (75 mg total) by mouth daily with breakfast. 07/23/18  Yes Daune Perch, NP  dicyclomine (BENTYL) 10 MG capsule Take 3 times daily as needed for spasm. Patient taking differently: Take 10 mg by mouth 3 (three) times daily as needed for spasms.  08/05/18  Yes Mansouraty, Telford Nab., MD  ezetimibe (ZETIA) 10 MG tablet Take 10 mg by mouth at bedtime.    Yes [provider]  fluconazole (DIFLUCAN) 100 MG tablet Take 1 tablet (100 mg total) by mouth daily. 08/31/18  Yes Elgergawy, Silver Huguenin, MD  levofloxacin (LEVAQUIN) 500 MG tablet Take 1 tablet (500 mg total) by mouth every other day for 10 days. 08/31/18 09/10/18 Yes Elgergawy, Silver Huguenin, MD  metFORMIN (GLUCOPHAGE) 1000 MG tablet TAKE 1 TABLET TWICE A DAY WITH MEALS Patient taking differently: Take 1,000 mg by mouth 2 (two) times daily with a meal.  08/02/16  Yes Estill Dooms, MD  metroNIDAZOLE (FLAGYL) 500 MG tablet Take 1 tablet (500 mg total) by mouth every 12 (twelve) hours for 11 days. 08/30/18 09/10/18 Yes Elgergawy, Silver Huguenin, MD  nitroGLYCERIN (NITROSTAT) 0.4 MG SL tablet Place 1 tablet (0.4 mg total) under the tongue every 5 (five) minutes as needed for chest pain. 01/31/18  Yes Croitoru, Mihai, MD    omeprazole (PRILOSEC) 40 MG capsule Take 1 capsule (40 mg  total) by mouth 2 (two) times daily. 08/11/18  Yes Mansouraty, Telford Nab., MD  ondansetron (ZOFRAN ODT) 8 MG disintegrating tablet Take 1 tablet (8 mg total) by mouth every 8 (eight) hours as needed for nausea or vomiting. 08/22/18  Yes Mansouraty, Telford Nab., MD  polyethylene glycol [facility]) packet Take 17 g by mouth 2 (two) times daily. 08/30/18  Yes Elgergawy, Silver Huguenin, MD  rosuvastatin (CRESTOR) 40 MG tablet Take one tablet by mouth once daily for cholesterol Patient taking differently: Take 40 mg by mouth daily.  10/23/17  Yes Reed, Tiffany L, DO  traMADol (ULTRAM) 50 MG tablet Take 1 tablet (50 mg total) by mouth every 8 (eight) hours as needed for up to 5 days (for pain.). 09/04/18 09/09/18 Yes Reed, Tiffany L, DO  glucose blood (ONETOUCH VERIO) test strip Use as instructed to test blood sugar twice daily DX: E11.29 09/04/18   [name], DO    Physical Exam: Vitals:   09/08/18 0100 09/08/18 0115 09/08/18 0130 09/08/18 0145  BP: (!) 181/87  (!) 174/90   Pulse: 85 87 75 83  Resp: (!) 22 20 20  (!) 21  SpO2: 96% 94% 95% 97%  Weight:      Height:        Constitutional: NAD, calm, comfortable Eyes: PERRL, lids and conjunctivae normal ENMT: Mucous membranes are moist. Posterior pharynx clear of any exudate or lesions.Normal dentition.  Neck: normal, supple, no masses, no thyromegaly Respiratory: clear to auscultation bilaterally, no wheezing, no crackles. Normal respiratory effort. No accessory muscle use.  Cardiovascular: Regular rate and rhythm, no murmurs / rubs / gallops. No extremity edema. 2+ pedal pulses. No carotid bruits.  Abdomen: no tenderness, no masses palpated. No hepatosplenomegaly. Bowel sounds positive.  Musculoskeletal: no clubbing / cyanosis. No joint deformity upper and lower extremities. Good ROM, no contractures. Normal muscle tone.  Skin: no rashes, lesions, ulcers. No induration Neurologic: CN 2-12  grossly intact. Sensation intact, DTR normal. Strength 5/5 in all 4.  Psychiatric: Normal judgment and insight. Alert and oriented x 3. Normal mood.    Labs on Admission: I have personally reviewed following labs and imaging studies  CBC: Recent Labs  Lab 09/04/18 1234 09/07/18 2301  WBC 8.2 7.6  NEUTROABS 5,551  --   HGB 11.0* 11.0*  HCT 35.3 35.5*  MCV 78.6* 78.9*  PLT 344 845   Basic Metabolic Panel: Recent Labs  Lab 09/04/18 1234 09/07/18 2301  NA 135 131*  K 4.1 4.0  CL 94* 97*  CO2 28 27  GLUCOSE 119 103*  BUN 16 13  CREATININE 1.89* 1.63*  CALCIUM 9.7 9.2   GFR: Estimated Creatinine Clearance: 24.2 mL/min (A) (by C-G formula based on SCr of 1.63 mg/dL (H)). Liver Function Tests: Recent Labs  Lab 09/04/18 1234  AST 25  ALT 11  BILITOT 0.6  PROT 6.8   No results for input(s): LIPASE, AMYLASE in the last 168 hours. No results for input(s): AMMONIA in the last 168 hours. Coagulation Profile: No results for input(s): INR, PROTIME in the last 168 hours. Cardiac Enzymes: No results for input(s): CKTOTAL, CKMB, CKMBINDEX, TROPONINI in the last 168 hours. BNP (last 3 results) No results for input(s): PROBNP in the last 8760 hours. HbA1C: No results for input(s): HGBA1C in the last 72 hours. CBG: No results for input(s): GLUCAP in the last 168 hours. Lipid Profile: No results for input(s): CHOL, HDL, LDLCALC, TRIG, CHOLHDL, LDLDIRECT in the last 72 hours. Thyroid Function Tests: No results  for input(s): TSH, T4TOTAL, FREET4, T3FREE, THYROIDAB in the last 72 hours. Anemia Panel: No results for input(s): VITAMINB12, FOLATE, FERRITIN, TIBC, IRON, RETICCTPCT in the last 72 hours. Urine analysis:    Component Value Date/Time   COLORURINE YELLOW 08/23/2018 1920   APPEARANCEUR CLEAR 08/23/2018 1920   LABSPEC 1.014 08/23/2018 1920   PHURINE 7.0 08/23/2018 1920   GLUCOSEU 50 (A) 08/23/2018 1920   HGBUR NEGATIVE 08/23/2018 1920   BILIRUBINUR NEGATIVE  08/23/2018 1920   BILIRUBINUR neg 07/18/2017 1118   KETONESUR NEGATIVE 08/23/2018 1920   PROTEINUR 100 (A) 08/23/2018 1920   UROBILINOGEN negative (A) 07/18/2017 1118   UROBILINOGEN 1.0 07/09/2014 0955   NITRITE NEGATIVE 08/23/2018 1920   LEUKOCYTESUR NEGATIVE 08/23/2018 1920    Radiological Exams on Admission: Dg Chest 2 View  Result Date: 09/07/2018 CLINICAL DATA:  Left chest pain since 1700 hours. Radiates to left shoulder. Shortness of breath. History of blood clots and cardiac history. EXAM: CHEST - 2 VIEW COMPARISON:  08/26/2018 FINDINGS: Postoperative changes in the mediastinum. Mild cardiac enlargement. No vascular congestion, edema, or consolidation in the lungs. No blunting of costophrenic angles. Tortuous and dilated aorta. IMPRESSION: Mild cardiac enlargement. No evidence of active pulmonary disease. Tortuous and dilated aorta. Electronically Signed   By: Lucienne Capers M.D.   On: 09/07/2018 23:39   Ct Head Wo Contrast  Result Date: 09/08/2018 CLINICAL DATA:  Generalized weakness. Headache. EXAM: CT HEAD WITHOUT CONTRAST TECHNIQUE: Contiguous axial images were obtained from the base of the skull through the vertex without intravenous contrast. COMPARISON:  Neck CTA 03/06/2012. FINDINGS: Brain: There is no evidence of acute infarct, intracranial hemorrhage, midline shift, or extra-axial fluid collection. A chronic lacunar infarct is noted in the right caudate nucleus. There is also a small chronic posterior right frontal cortical infarct. Cerebral white matter hypodensities are nonspecific but compatible with mild chronic small vessel ischemic disease. There is mild cerebral atrophy. An 8 mm extra-axial mass is noted in the right frontoparietal region without mass effect on the adjacent brain. Vascular: Calcified atherosclerosis at the skull base. No hyperdense vessel. Partially visualized right ICA atherosclerosis and mild dilatation in the neck with history of endarterectomy and  dissects in flat shown on the prior CTA. Skull: No fracture or focal osseous lesion. Sinuses/Orbits: Paranasal sinuses and mastoid air cells are clear. Right cataract extraction. Other: None. IMPRESSION: 1. No evidence of acute intracranial abnormality. 2. Mild chronic small vessel ischemic disease and chronic infarcts as above. 3. 8 mm right frontoparietal extra-axial mass, likely an incidental meningioma. Electronically Signed   By: Logan Bores M.D.   On: 09/08/2018 00:51    EKG: Independently reviewed.  Assessment/Plan Principal Problem:   Chest pain, rule out acute myocardial infarction Active Problems:   CAD (coronary artery disease): 1993 PTCA to circumflex.  2002 2 BMS stents to ostial, S/P CABG x 3 12/04/12   DM (diabetes mellitus), type 2 with renal complications (HCC)   HTN (hypertension)   Thoracoabdominal aortic aneurysm (TAAA) without rupture (HCC)   CKD (chronic kidney disease) stage 3, GFR 30-59 ml/min (HCC)   History of stent insertion of renal artery    1. CP r/o - 1. CP obs pathway 2. Serial trops 3. Tele monitor 4. NPO 5. Cards eval in AM 6. Will resume coreg in case there is a hypertensive component 7. Or alternatively could be GI component as patient is still on treatment for candidal esophagitis 8. Non-specific T wave changes new since admit last month however 9.  GI cocktail and morphine PRN 10. Continue statin 11. Continue ASA/Plavix 2. Vascular disease including - 1. CAD 2. PAD 3. TAAA which was enlarged some on CT angio abd/pelvis on 10/22 4. Probably some degree of chronic mesenteric ischemia as seen on CT angio. 3. HTN s/p renal artery stent - 1. All BP meds had been stopped at time of discharge 8 days ago 2. However BPs look like they are running 409-811 systolic in ED today 3. Will resume half dose coreg (12.5mg  PO BID) 4. CKD stage 3 - chronic and stable 5. DM2 - 1. Sensitive SSI q4h 6. R hand numbness - 1. Will get MRI brain to r/o  stroke.  DVT prophylaxis: Lovenox Code Status: Full Family Communication: No family in room Disposition Plan: Home after admit Consults called: Message sent to P. Trent for cards eval in AM Admission status: Place in [place], [place] [facility] Pager [phone] Only works nights!  If 7AM-7PM, please contact the primary day team physician taking care of patient  [email or web address] Password [email or web address]  09/08/2018, 1:56 AM

## 2018-09-08 NOTE — Progress Notes (Signed)
   Kendra Todd presented for a nuclear stress test today.  No immediate complications.  Stress imaging is pending at this time.  Preliminary EKG findings may be listed in the chart, but the stress test result will not be finalized until perfusion imaging is complete.   [name], NP-C 09/08/2018, 2:35 PM

## 2018-09-08 NOTE — Progress Notes (Signed)
Initial Nutrition Assessment  DOCUMENTATION CODES:   Not applicable  INTERVENTION:   -Ensure Enlive po BID, each supplement provides 350 kcal and 20 grams of protein -MVI with minerals daily  NUTRITION DIAGNOSIS:   Inadequate oral intake related to altered GI function, decreased appetite as evidenced by per patient/family report, percent weight loss.  GOAL:   Patient will meet greater than or equal to 90% of their needs  MONITOR:   PO intake, Supplement acceptance, Diet advancement, Labs, Weight trends, Skin, I & O's  REASON FOR ASSESSMENT:   Malnutrition Screening Tool    ASSESSMENT:   Kendra Todd is a 77 y.o. female with medical history significant of RAS s/p stents in Sept, CAD s/p stents and CABG 2014, HLD, PAF, DM2, HTN, Stroke, TAAA. Since discharge she had been feeling better, until today when she developed L sided CP onset at 1700, radiation into L shoulder.  Associated SOB.  Partial relief (from 8 to 5) with NTG en route  Pt admitted with chest pain.   Reviewed I/O's: +418 ml x 24 hoursa  Spoke with pt, who is familiar with this RD due to multiple prior admissions. Pt has had a history of decreased oral intake over the past month, secondary to nausea and early satiety. Pt reports she tries to eat as much as she can, however, "comes right back up if I push too hard". Her nausea has been improved, due to her PCP increasing doses.   Pt reports consuming 2 meals and 1 snack daily (Breakfast: Ensure supplement, Snack: crackers and juice, Lunch and dinner: vegetables). She is concerned about ongoing weight loss; noted 8.2% wt loss over the past month, which is significant for time frame.   Per discussion with nurse tech and RN, pt is eager to eat, but will remain NPO until cardiology wotk-up is completed.   Discussed with pt importance of good meal and supplement intake to promote healing. Pt amenable to resume Ensure supplements once diet is advanced.   Last Hgb A1c:  6.1 (09/04/18). PTA DM medications are 1000 mg metformin BID.   Labs reviewed: CBGS: 83-87 (inpatient orders for glycemic control are 0-9 units insulin aspart every 4 hours).   NUTRITION - FOCUSED PHYSICAL EXAM:    Most Recent Value  Orbital Region  No depletion  Upper Arm Region  Mild depletion  Thoracic and Lumbar Region  No depletion  Buccal Region  No depletion  Temple Region  Mild depletion  Clavicle Bone Region  Mild depletion  Clavicle and Acromion Bone Region  No depletion  Scapular Bone Region  No depletion  Dorsal Hand  Mild depletion  Patellar Region  No depletion  Anterior Thigh Region  No depletion  Posterior Calf Region  No depletion  Hair  Reviewed  Eyes  Reviewed  Mouth  Reviewed  Skin  Reviewed  Nails  Reviewed       Diet Order:   Diet Order            Diet heart healthy/carb modified Room service appropriate? Yes; Fluid consistency: Thin  Diet effective now              EDUCATION NEEDS:   Education needs have been addressed  Skin:  Skin Assessment: Skin Integrity Issues: Skin Integrity Issues:: Other (Comment) Other: non pressure wound to medial buttocks  Last BM:  09/07/18  Height:   Ht Readings from Last 1 Encounters:  09/07/18 5\' 2"  (1.575 m)    Weight:   Wt  Readings from Last 1 Encounters:  09/08/18 61.6 kg    Ideal Body Weight:  50 kg  BMI:  Body mass index is 24.84 kg/m.  Estimated Nutritional Needs:   Kcal:  1650-1850  Protein:  80-95 grams  Fluid:  > 1.6 L    Sivan Quast A. Jimmye Norman, RD, LDN, CDE Pager: [phone] After hours Pager: [phone]

## 2018-09-08 NOTE — ED Notes (Signed)
Patient transported to CT 

## 2018-09-08 NOTE — Progress Notes (Signed)
   09/08/18 0237  Vitals  Temp 98.5 F (36.9 C)  Temp Source Oral  BP (!) 194/86  MAP (mmHg) 116  BP Location Right Arm  BP Method Automatic  Patient Position (if appropriate) Sitting  Pulse Rate 86  Pulse Rate Source Monitor  Resp 18  Oxygen Therapy  SpO2 95 %  O2 Device Room Air   Provider Paged about pt BP. No new orders given.

## 2018-09-08 NOTE — Consult Note (Addendum)
Cardiology Consultation:   Patient ID: [name] MRN: [id number]; DOB: 08-17-1941  Admit date: 09/07/2018 Date of Consult: 09/08/2018  Primary Care Provider: [name], DO Primary Cardiologist: Kendra Klein, MD  Primary Electrophysiologist:  None  PAD--Kendra Todd  Patient Profile:   Kendra Todd is a 78 y.o. female with a hx of CAD with CABG 2014, thoracoabdominal aortic aneurysm, CKD stage III, L subclavian stenosis, severe pulmonary artery hypertension, carotid artery disease s/p bilateral CEA, history of CVA, hypertension, hyperlipidemia, PAF and DM2 who is being seen today for the evaluation of chest pain at the request of Kendra Todd.  History of Present Illness:   Kendra Todd with above hx including stents to RCA in 2002, and several PTCAs prior to that.  CABG in 2014 with LIMA to LAD, SVG to LCX, VG to RCA.   Echo 2017 with severe pulmonary HTN, PA pk pressure 73, G2DD, moderate MR.  Most recent Echo 02/2018 with EF 60-65%, moderate LVH, G2 DD, LA was severely dilated, mild to mod TR, PA pk pressure was 49 mHg.   Last DCCV was in 2012.  CHA2DS2VASc is a7, has not been on anticoagulation due to GI bleeding.     She has had bilateral CEAs , one complicated by dissection flap and a stroke- followed by Kendra Todd.  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  And Kendra Todd did PV angio and proceeded to place stents to bilateral renal arteries- 07/2018.  Follow up dopplers with patent bil. Renal arteries. Pt on plavix.   Was hospitalized 08/30/18 with LLQ pain, AKI, Hyponatremia, orthostatic hypotension with associated dizziness.  Also with H.pylori and iron def anemia.   Her coreg 25 mg BID was stopped, her lasix 20 mg prn daily was stopped, Hydralazine 25 mg  BID was stopped and HCTZ  12.5 daily was stopped.   Now admitted 09/08/18 after presenting to ER 09/07/18 with Lt sided chest pain.  BP at home was 315 systolic, presented by EMS  and 3 NTG given en route.  Pain did radiate in to lt shoulder and she did have SOB.  She did have some relief with the NTG.  Then with GI cocktail and morphine.   EKG I personally reviewed with SR and non specific T wave abnormalities.   Tele:  I personally reviewed.  SR Troponin 0.01, <0.03 X 2 Na 131, K+ 4.0, cl 97, Cr 1.63 Hgb 11, Hct 35.5, WBC 7.6 PLts 247. Hgb A1c 6.1 Head CTA head-- no acute abnormality and 8 mm right frontoparietal extra-axial mass, likely an incidental meningioma.  Currently no chest pain.  This was different than with her cardiac pain in the past.  Not pressure but a sharp pain.  Her nausea did not increase.      Past Medical History:  Diagnosis Date  . Abdominal pain, epigastric   . Acute upper respiratory infections of unspecified site   . Arthritis   . Benign paroxysmal positional vertigo   . CAD (coronary artery disease)    prior coronary stenting  . Candidiasis of vulva and vagina   . Carotid artery disease ([place]) 2001   s/p Bilateral CEA, after CVA  . Cervicalgia   . Decreased pedal pulses 06/04/2011   doppler - bilateral ABIs no evidence of insufficiency; L CIA slightly elevated velocities 20-30% diameter reduction;   . Dyspnea 03/13/2006   Myoview - EF 76%; mild ischemia in apical lateral region, less  severe than previous study in 2002  . Edema   . H/O carotid endarterectomy 03/06/2012   CT angio -high grade stenosis of proximal R internal carotid w/ lg posterior ulceration or dissection flap; near occlusive stenosis at origin of nondominantproximal L vertebral artery; <50% stenosis of proximal R vertebral and proximal L subclavian artery  . H/O endarterectomy 09/11/2012   patent L and R carotid sites, R 60-79% restenosed ICA; L <40% restenosed ICA  . H/O endarterectomy 01/08/2012   doppler - R distal common carotid/proximal ICA stenosed 80-99%, L 0-39%  . HTN (hypertension) 11/27/2012  . Hyperlipidemia   . Limb pain 05/02/2011   doppler - no evidence of  thrombus of thrombophlebitis  . Long term (current) use of anticoagulants   . Loss of weight   . Lumbago   . Myalgia and myositis, unspecified   . Myocardial infarction ([place])   . Nonspecific abnormal results of liver function study   . Nontoxic uninodular goiter   . Osteoarthrosis, unspecified whether generalized or localized, unspecified site   . Other malaise and fatigue   . Other manifestations of vitamin A deficiency   . PAF (paroxysmal atrial fibrillation) ([place]) 02/21/2011  . Pain in joint, pelvic region and thigh   . Pain in limb   . Peripheral vascular disease ([place])   . Phlebitis and thrombophlebitis of superficial vessels of lower extremities   . Primary localized osteoarthrosis, hand   . Pulmonary HTN ([place]) 12/03/2012  . Renal artery stenosis ([place])   . Routine gynecological examination   . Spinal stenosis, unspecified region other than cervical   . Stenosis of artery ([place]) 06/03/2012   doppler- most distal aspect of abd aorta no aneurysmal dilation; bilateral ABIs - mild L arterial insufficiency, L CIA narrowing w/ 50-69% diameter reduction; L and R SFA both w/ 0-49% diameter reductions  . Stroke ([place]) 2001  . Superficial vein thrombosis 05/11/2015   Left forehead  . Tobacco use disorder   . TR (tricuspid regurgitation), severe 12/02/12 for repair with CABG 12/03/2012  . Type II or unspecified type diabetes mellitus with peripheral circulatory disorders, not stated as uncontrolled(250.70)   . Unspecified cataract   . Unspecified disorder of iris and ciliary body   . Unspecified vitamin D deficiency   . Urinary tract infection, site not specified     Past Surgical History:  Procedure Laterality Date  . ABDOMINAL HYSTERECTOMY  1975  . ANGIOPLASTY  1984 and 1982  . APPENDECTOMY  1954  . ARCH AORTOGRAM  03/30/2011   40% recurrent stenosis at origin of R proximal carotid patch, shelf like recurrent stenosis w/in midsection of patch that does not appear to be flow limiting;  normal non-stenosed great vessel origins  . BACK SURGERY  '84,'86, '87   three previous surgeries  . CAROTID ENDARTERECTOMY  2001   Bilateral  . CORONARY ARTERY BYPASS GRAFT  12/04/2012   Procedure: CORONARY ARTERY BYPASS GRAFTING (CABG);  Surgeon: Ivin Poot, MD; LIMA-LAD, SVG-OM, SVG-RCA  . ESOPHAGOGASTRODUODENOSCOPY (EGD) WITH PROPOFOL N/A 08/27/2018   Procedure: ESOPHAGOGASTRODUODENOSCOPY (EGD) WITH PROPOFOL;  Surgeon: Mauri Pole, MD;  Location: [facility];  Service: Endoscopy;  Laterality: N/A;  . INTRAOPERATIVE TRANSESOPHAGEAL ECHOCARDIOGRAM  12/04/2012   Procedure: INTRAOPERATIVE TRANSESOPHAGEAL ECHOCARDIOGRAM;  Surgeon: Ivin Poot, MD;  Location: [facility];  Service: Open Heart Surgery;  Laterality: N/A;  . KIDNEY STONE SURGERY     Removal  . LEFT HEART CATHETERIZATION WITH CORONARY ANGIOGRAM N/A 11/28/2012   Procedure: LEFT HEART CATHETERIZATION  WITH CORONARY ANGIOGRAM;  Surgeon: Leonie Man, MD;  Location: [facility] CATH LAB;  Service: Cardiovascular;  Laterality: N/A;  . PERCUTANEOUS CORONARY STENT INTERVENTION (PCI-S)  2002   2 BMS in ostial/Prox & mid RCA (mid 2.5 mm 20x  mm, & 2.75 mm x  8 mm ostial)  . PERIPHERAL VASCULAR INTERVENTION  07/21/2018   Procedure: PERIPHERAL VASCULAR INTERVENTION;  Surgeon: Lorretta Harp, MD;  Location: [facility] CV LAB;  Service: Cardiovascular;;  bilateral renal stents  . RENAL ANGIOGRAM N/A 11/30/2013   Procedure: RENAL ANGIOGRAM;  Surgeon: Lorretta Harp, MD;  Location[facility] CATH LAB;  Service: Cardiovascular;  Laterality: N/A;  . RENAL ANGIOGRAPHY N/A 07/21/2018   Procedure: RENAL ANGIOGRAPHY;  Surgeon: Lorretta Harp, MD;  Location: [place] CV LAB;  Service: Cardiovascular;  Laterality: N/A;  . RIGHT HEART CATHETERIZATION N/A 12/02/2012   Procedure: RIGHT HEART CATH;  Surgeon: Troy Sine, MD;  Location: [facility] CATH LAB;  Service: Cardiovascular;  Laterality: N/A;  . TEE WITH CARDIOVERSION  01/23/2011   EF 50-55%; grade 2  diastolic dysfunction, elevated mean LA filling pressure, RV systolic pressure increased consistent w/ mod pulmonary hypertension     Home Medications:  Prior to Admission medications   Medication Sig Start Date End Date Taking? Authorizing Provider  alendronate (FOSAMAX) 70 MG tablet Take 70 mg by mouth every Friday.  07/13/16  Yes [provider]  bimatoprost (LUMIGAN) 0.01 % SOLN Place 1 drop into both eyes at bedtime.   Yes [provider]  clopidogrel (PLAVIX) 75 MG tablet Take 1 tablet (75 mg total) by mouth daily with breakfast. 07/23/18  Yes Daune Perch, NP  dicyclomine (BENTYL) 10 MG capsule Take 3 times daily as needed for spasm. Patient taking differently: Take 10 mg by mouth 3 (three) times daily as needed for spasms.  08/05/18  Yes Mansouraty, Telford Nab., MD  ezetimibe (ZETIA) 10 MG tablet Take 10 mg by mouth at bedtime.    Yes [provider]  fluconazole (DIFLUCAN) 100 MG tablet Take 1 tablet (100 mg total) by mouth daily. 08/31/18  Yes Elgergawy, Silver Huguenin, MD  levofloxacin (LEVAQUIN) 500 MG tablet Take 1 tablet (500 mg total) by mouth every other day for 10 days. 08/31/18 09/10/18 Yes Elgergawy, Silver Huguenin, MD  metFORMIN (GLUCOPHAGE) 1000 MG tablet TAKE 1 TABLET TWICE A DAY WITH MEALS Patient taking differently: Take 1,000 mg by mouth 2 (two) times daily with a meal.  08/02/16  Yes Estill Dooms, MD  metroNIDAZOLE (FLAGYL) 500 MG tablet Take 1 tablet (500 mg total) by mouth every 12 (twelve) hours for 11 days. 08/30/18 09/10/18 Yes Elgergawy, Silver Huguenin, MD  nitroGLYCERIN (NITROSTAT) 0.4 MG SL tablet Place 1 tablet (0.4 mg total) under the tongue every 5 (five) minutes as needed for chest pain. 01/31/18  Yes Croitoru, Mihai, MD  omeprazole (PRILOSEC) 40 MG capsule Take 1 capsule (40 mg total) by mouth 2 (two) times daily. 08/11/18  Yes Mansouraty, Telford Nab., MD  ondansetron (ZOFRAN ODT) 8 MG disintegrating tablet Take 1 tablet (8 mg total) by mouth every 8  (eight) hours as needed for nausea or vomiting. 08/22/18  Yes Mansouraty, Telford Nab., MD  polyethylene glycol [facility]) packet Take 17 g by mouth 2 (two) times daily. 08/30/18  Yes Elgergawy, Silver Huguenin, MD  rosuvastatin (CRESTOR) 40 MG tablet Take one tablet by mouth once daily for cholesterol Patient taking differently: Take 40 mg by mouth daily.  10/23/17  Yes Reed, Tiffany L,  DO  traMADol (ULTRAM) 50 MG tablet Take 1 tablet (50 mg total) by mouth every 8 (eight) hours as needed for up to 5 days (for pain.). 09/04/18 09/09/18 Yes Reed, Tiffany L, DO  glucose blood (ONETOUCH VERIO) test strip Use as instructed to test blood sugar twice daily DX: E11.29 09/04/18   Kendra Curry, DO    Inpatient Medications: Scheduled Meds: . carvedilol  12.5 mg Oral BID WC  . clopidogrel  75 mg Oral Q breakfast  . enoxaparin (LOVENOX) injection  40 mg Subcutaneous QHS  . ezetimibe  10 mg Oral QHS  . fluconazole  100 mg Oral Daily  . hydrALAZINE  5 mg Intravenous Once  . insulin aspart  0-9 Units Subcutaneous Q4H  . latanoprost  1 drop Both Eyes QHS  . [START ON 09/09/2018] levofloxacin  500 mg Oral Q48H  . metroNIDAZOLE  500 mg Oral Q12H  . pantoprazole  80 mg Oral BID  . polyethylene glycol  17 g Oral BID  . rosuvastatin  40 mg Oral Daily   Continuous Infusions:  PRN Meds: acetaminophen, dicyclomine, morphine injection, ondansetron (ZOFRAN) IV  Allergies:    Allergies  Allergen Reactions  . Iron Hives  . Vicodin [Hydrocodone-Acetaminophen] Other (See Comments)    Caused arrythmia   . Amiodarone Nausea And Vomiting  . Norvasc [Amlodipine Besylate] Nausea And Vomiting  . Promethazine Other (See Comments)    hallucinations  . Vioxx [Rofecoxib] Nausea And Vomiting    Social History:   Social History   Socioeconomic History  . Marital status: Widowed    Spouse name: Not on file  . Number of children: Not on file  . Years of education: Not on file  . Highest education level: Not on file    Occupational History  . Occupation: retired  Scientific laboratory technician  . Financial resource strain: Not hard at all  . Food insecurity:    Worry: Never true    Inability: Never true  . Transportation needs:    Medical: No    Non-medical: No  Tobacco Use  . Smoking status: Former Smoker    Packs/day: 0.30    Years: 57.00    Pack years: 17.10    Types: Cigarettes    Last attempt to quit: 11/19/2012    Years since quitting: 5.8  . Smokeless tobacco: Never Used  Substance and Sexual Activity  . Alcohol use: Yes    Comment: rarely, 1 a month  . Drug use: No  . Sexual activity: Not Currently  Lifestyle  . Physical activity:    Days per week: 0 days    Minutes per session: 0 min  . Stress: Only a little  Relationships  . Social connections:    Talks on phone: More than three times a week    Gets together: More than three times a week    Attends religious service: More than 4 times per year    Active member of club or organization: Yes    Attends meetings of clubs or organizations: More than 4 times per year    Relationship status: Widowed  . Intimate partner violence:    Fear of current or ex partner: No    Emotionally abused: No    Physically abused: No    Forced sexual activity: No  Other Topics Concern  . Not on file  Social History Narrative  . Not on file    Family History:    Family History  Problem Relation Age of Onset  .  Other Mother        CVA  . Diabetes Mother   . Heart disease Mother        Heart disease before age 43  . Heart attack Mother   . Heart disease Father        Heart Disease before age 88  . Heart attack Father   . Heart disease Sister        Amputation  . Diabetes Sister   . Heart disease Brother        Heart disease before age 27  . Diabetes Brother   . Stroke Brother   . Stroke Brother   . Heart disease Brother   . Stroke Brother   . Heart disease Brother   . Heart disease Sister   . Heart disease Sister   . Diabetes Daughter   .  Cancer Sister   . Diabetes Other   . Cancer Other        breast  . Heart disease Other        cad  . Other Other        cva  . Colon cancer Neg Hx   . Esophageal cancer Neg Hx   . Inflammatory bowel disease Neg Hx   . Liver disease Neg Hx   . Pancreatic cancer Neg Hx   . Rectal cancer Neg Hx   . Stomach cancer Neg Hx      ROS:  Please see the history of present illness.  General:no colds or fevers, + weight loss duet to N&V, decreased appetite.  Skin:no rashes or ulcers HEENT:no blurred vision, no congestion CV:see HPI PUL:see HPI GI:no diarrhea constipation or melena, no indigestion GU:no hematuria, no dysuria MS:no joint pain, no claudication Neuro:no syncope, no lightheadedness Endo:+ diabetes, no thyroid disease Hemoc with anemia, iron def. With iron infusions.   All other ROS reviewed and negative.     Physical Exam/Data:   Vitals:   09/08/18 0200 09/08/18 0237 09/08/18 0543 09/08/18 0834  BP: (!) 184/79 (!) 194/86 (!) 143/81 136/78  Pulse: 78 86 70 72  Resp: 20 18 18    Temp:  98.5 F (36.9 C) 97.9 F (36.6 C) 98.1 F (36.7 C)  TempSrc:  Oral Oral Oral  SpO2: 96% 95% 92% 98%  Weight:  61.6 kg    Height:        Intake/Output Summary (Last 24 hours) at 09/08/2018 0918 Last data filed at 09/08/2018 0500 Gross per 24 hour  Intake 418.03 ml  Output -  Net 418.03 ml   Filed Weights   09/07/18 2300 09/08/18 0237  Weight: 61.6 kg 61.6 kg   Body mass index is 24.84 kg/m.  General:  Well nourished to thin, well developed, in no acute distress HEENT: normal Lymph: no adenopathy Neck: no JVD Endocrine:  No thryomegaly Vascular: No carotid bruits; pedal pulses 1+ bilaterally without bruits  Cardiac:  normal S1, S2; RRR; no murmur gallup rub or click Lungs:  clear to auscultation bilaterally, no wheezing, rhonchi or rales  Abd: soft, mild diffuse tenderness, no hepatomegaly  Ext: no edema Musculoskeletal:  No deformities, BUE and BLE strength normal and  equal Skin: warm and dry  Neuro:  Alert and oriented X 3 MAE follows commands, rt grip was not as strong as left and she just returned from MRI of brain. Psych:  Normal affect   Relevant CV Studies: Echo 02/03/18 Study Conclusions  - Left ventricle: The cavity size was normal. Wall thickness was  increased in a pattern of moderate LVH. Systolic function was   normal. The estimated ejection fraction was in the range of 60%   to 65%. Wall motion was normal; there were no regional wall   motion abnormalities. Features are consistent with a pseudonormal   left ventricular filling pattern, with concomitant abnormal   relaxation and increased filling pressure (grade 2 diastolic   dysfunction). - Aortic valve: Moderately calcified annulus. - Left atrium: The atrium was severely dilated. - Tricuspid valve: There was mild-moderate regurgitation. - Pulmonary arteries: Systolic pressure was moderately increased.   PA peak pressure: 49 mm Hg (S).  Event monitor 2018 with PAF  CT angio chest, aorta 05/28/18 CTA CHEST FINDINGS  Cardiovascular: Mild cardiomegaly. Prior CABG. Ascending thoracic aorta normal caliber, maximally 3 cm. There is aneurysmal dilatation of the descending thoracic aorta diffusely, maximally 4.4 cm. Diffuse irregular plaque throughout the descending thoracic aorta. Marked irregular plaque at the aortic hiatus where the aorta measures up to 4.2 cm. No dissection.  Mediastinum/Nodes: Scattered small and borderline sized mediastinal lymph nodes are stable since prior study, likely reactive. No hilar or axillary adenopathy.  Lungs/Pleura: No confluent airspace opacities. Trace bilateral pleural effusions.  Musculoskeletal: No acute bony abnormality.  Review of the MIP images confirms the above findings.  CTA ABDOMEN AND PELVIS FINDINGS  VASCULAR  Aorta: Markedly irregular plaque throughout the abdominal aorta, particularly suprarenal. Maximum aortic  diameter approximately 4 cm. Maximal infrarenal diameter 3.4 cm. No dissection.  Celiac: Moderate ostial stenosis, greater than 50%. Mild poststenotic dilatation.  SMA: High-grade ostial stenosis, likely greater than 70%.  Renals: Single renal arteries bilaterally. Probable high-grade ostial stenosis bilaterally, difficult to visualize.  IMA: Small caliber, patent.  Inflow: Mark atherosclerotic irregularity and calcifications throughout the iliac vessels. No aneurysm or dissection.  Veins: Grossly unremarkable.  Review of the MIP images confirms the above findings.  NON-VASCULAR  Hepatobiliary: No focal hepatic abnormality. Gallbladder unremarkable.  Pancreas: No ductal dilatation or focal mass.  Spleen: Normal size.  No focal lesion.  Adrenals/Urinary Tract: Cortical thinning within the kidneys bilaterally. Left kidney is atrophic, stable since prior study. No hydronephrosis. No adrenal mass. Urinary bladder decompressed, grossly unremarkable.  Stomach/Bowel: No evidence of bowel obstruction.  Lymphatic: No adenopathy.  Reproductive: Prior hysterectomy.  No adnexal mass.  Other: No free fluid or free air.  Musculoskeletal: Sclerosis about the SI joints, right greater than left compatible with sacroiliitis. Degenerative changes in the lower lumbar spine. No acute bony abnormality.  Review of the MIP images confirms the above findings.  IMPRESSION: Severely atherosclerotic aorta with extensive irregular thrombus, similar to prior study.  Mild aneurysmal dilatation of the descending thoracic aorta, maximally 4.4 cm compared to 4.3 cm previously. Maximum abdominal aortic diameter 4 cm suprarenal and 3.4 cm infrarenal. No significant change since prior study.  Moderate to severe ostial stenosis involving the celiac, SMA and bilateral renal arteries, similar to prior study.  Asymmetric left renal atrophy, likely related to  chronic renovascular disease.  Mild cardiomegaly.  Trace bilateral pleural effusions.  No acute findings in the abdomen or pelvis.  08/26/18 CT angio of abd  VASCULAR  1. Interval increase in size of infrarenal abdominal aortic aneurysm, measuring 3.6 x 3.6 x 3.2 cm, previously, 2.8 x 3.5 x 3.2 cm. Aortic aneurysm NOS (ICD10-I71.9). 2. Large amount of irregular mixed calcified though prominently noncalcified atherosclerotic plaque throughout the abdominal aorta including non flow limiting short-segment dissection distal aspect of the abdominal aorta, similar to the 01/2016  examination. Aortic Atherosclerosis (ICD10-I70.0). 3. At least 50% luminal narrowing involving the origin the SMA however the celiac artery is patent without hemodynamically significant narrowing. The IMA is atretic though appears patent. 4. Suspected hemodynamically significant narrowings involving the origin of the bilateral external iliac arteries as well as the left common femoral artery. 5. Post stenting of the origin of the bilateral renal arteries.  NON-VASCULAR  1. Moderate colonic stool burden without evidence of enteric obstruction. No discrete abnormal areas of bowel wall thickening, pneumatosis or portal venous gas. 2. Cardiomegaly with small bilateral effusions with associated bibasilar opacities, left greater than likely atelectasis. 3. Mild nodularity hepatic contour as could be seen in the setting early cirrhotic change. 4. Stable asymmetric atrophy of the left kidney in comparison right without evidence of urinary obstruction.   Laboratory Data:  Chemistry Recent Labs  Lab 09/04/18 1234 09/07/18 2301  NA 135 131*  K 4.1 4.0  CL 94* 97*  CO2 28 27  GLUCOSE 119 103*  BUN 16 13  CREATININE 1.89* 1.63*  CALCIUM 9.7 9.2  GFRNONAA 25* 29*  GFRAA 29* 33*  ANIONGAP  --  7    Recent Labs  Lab 09/04/18 1234  PROT 6.8  AST 25  ALT 11  BILITOT 0.6    Hematology Recent Labs  Lab 09/04/18 1234 09/07/18 2301  WBC 8.2 7.6  RBC 4.49 4.50  HGB 11.0* 11.0*  HCT 35.3 35.5*  MCV 78.6* 78.9*  MCH 24.5* 24.4*  MCHC 31.2* 31.0  RDW 18.9* 21.2*  PLT 344 247   Cardiac Enzymes Recent Labs  Lab 09/08/18 0236 09/08/18 0553  TROPONINI <0.03 <0.03    Recent Labs  Lab 09/07/18 2259  TROPIPOC 0.01    BNPNo results for input(s): BNP, PROBNP in the last 168 hours.  DDimer No results for input(s): DDIMER in the last 168 hours.  Radiology/Studies:  Dg Chest 2 View  Result Date: 09/07/2018 CLINICAL DATA:  Left chest pain since 1700 hours. Radiates to left shoulder. Shortness of breath. History of blood clots and cardiac history. EXAM: CHEST - 2 VIEW COMPARISON:  08/26/2018 FINDINGS: Postoperative changes in the mediastinum. Mild cardiac enlargement. No vascular congestion, edema, or consolidation in the lungs. No blunting of costophrenic angles. Tortuous and dilated aorta. IMPRESSION: Mild cardiac enlargement. No evidence of active pulmonary disease. Tortuous and dilated aorta. Electronically Signed   By: Lucienne Capers M.D.   On: 09/07/2018 23:39   Ct Head Wo Contrast  Result Date: 09/08/2018 CLINICAL DATA:  Generalized weakness. Headache. EXAM: CT HEAD WITHOUT CONTRAST TECHNIQUE: Contiguous axial images were obtained from the base of the skull through the vertex without intravenous contrast. COMPARISON:  Neck CTA 03/06/2012. FINDINGS: Brain: There is no evidence of acute infarct, intracranial hemorrhage, midline shift, or extra-axial fluid collection. A chronic lacunar infarct is noted in the right caudate nucleus. There is also a small chronic posterior right frontal cortical infarct. Cerebral white matter hypodensities are nonspecific but compatible with mild chronic small vessel ischemic disease. There is mild cerebral atrophy. An 8 mm extra-axial mass is noted in the right frontoparietal region without mass effect on the adjacent brain.  Vascular: Calcified atherosclerosis at the skull base. No hyperdense vessel. Partially visualized right ICA atherosclerosis and mild dilatation in the neck with history of endarterectomy and dissects in flat shown on the prior CTA. Skull: No fracture or focal osseous lesion. Sinuses/Orbits: Paranasal sinuses and mastoid air cells are clear. Right cataract extraction. Other: None. IMPRESSION: 1. No evidence of  acute intracranial abnormality. 2. Mild chronic small vessel ischemic disease and chronic infarcts as above. 3. 8 mm right frontoparietal extra-axial mass, likely an incidental meningioma. Electronically Signed   By: Logan Bores M.D.   On: 09/08/2018 00:51    Assessment and Plan:   1. Chest pain- no pressure but pain. neg MI possible BP elevated causing pain. But with grafts 77 years old may need lexiscan myoview, she cannot walk on treadmill. No nuc since CABG.  Dr. Beau Fanny has seen, will plan fo rnuc 2. Hx CAD and CABG in 2014-- last nuc prior to CABG 3. Thoracoabdominal aortic aneurysm slightly larger on last CT angio 4. CKD III  5. Iron def anemia on plavix due to recent renal stents. 6. Hx GI bleed 7. bil renal artery stenosis with stents placed 07/21/18  8. PAF currently in SR no anticoagulation due to anemia and GI bleed 9. HTN improved with renal artery stents 10. HLD on crestor and zetia last LDL 86 11. PAD with CEAs and Lt subclavian artery stensosis - asymptomatic.  12. Wt loss due to possible mesenteric ischemia 13. DM2 per IM.        For questions or updates, please contact [place] Please consult [email or web address] for contact info under     Signed, Cecilie Kicks, NP  09/08/2018 9:18 AM   I have examined the patient and reviewed assessment and plan and discussed with patient.  Agree with above as stated.  Chest pain with negative troponin.  Some typical and some atypical features.  S/p CABG.  Continue aggressive secondary prevention given PAD. CRI- improving.  PLan for  stress test.   Larae Grooms

## 2018-09-09 ENCOUNTER — Observation Stay (HOSPITAL_COMMUNITY): Payer: Medicare Other

## 2018-09-09 DIAGNOSIS — R079 Chest pain, unspecified: Secondary | ICD-10-CM

## 2018-09-09 DIAGNOSIS — E1122 Type 2 diabetes mellitus with diabetic chronic kidney disease: Secondary | ICD-10-CM | POA: Diagnosis not present

## 2018-09-09 DIAGNOSIS — M4802 Spinal stenosis, cervical region: Secondary | ICD-10-CM | POA: Diagnosis not present

## 2018-09-09 DIAGNOSIS — N183 Chronic kidney disease, stage 3 (moderate): Secondary | ICD-10-CM | POA: Diagnosis not present

## 2018-09-09 DIAGNOSIS — I25118 Atherosclerotic heart disease of native coronary artery with other forms of angina pectoris: Secondary | ICD-10-CM | POA: Diagnosis not present

## 2018-09-09 DIAGNOSIS — Z9889 Other specified postprocedural states: Secondary | ICD-10-CM | POA: Diagnosis not present

## 2018-09-09 LAB — GLUCOSE, CAPILLARY
GLUCOSE-CAPILLARY: 116 mg/dL — AB (ref 70–99)
GLUCOSE-CAPILLARY: 138 mg/dL — AB (ref 70–99)
GLUCOSE-CAPILLARY: 88 mg/dL (ref 70–99)
Glucose-Capillary: 82 mg/dL (ref 70–99)
Glucose-Capillary: 135 mg/dL — ABNORMAL HIGH (ref 70–99)

## 2018-09-09 MED ORDER — CARVEDILOL 12.5 MG PO TABS: 12.5000 mg | ORAL_TABLET | Freq: Two times a day (BID) | ORAL | 1 refills | Status: AC

## 2018-09-09 NOTE — Progress Notes (Signed)
I completed a follow up visit with the patient with her daughter present. I shared words of encouragement and led in prayer. I shared that Chaplain is available for additional support as needed or requested.     09/09/18 1050  Clinical Encounter Type  Visited With Patient and family together  Visit Type Follow-up;Spiritual support  Stress Factors  Patient Stress Factors Exhausted  Family Stress Factors None identified    Chaplain Dr Redgie Grayer

## 2018-09-09 NOTE — Evaluation (Addendum)
Physical Therapy Evaluation Patient Details Name: Kendra Todd MRN: [id number] DOB: Nov 10, 1940 Today's Date: 09/09/2018   History of Present Illness  Kendra Todd a 77 y.o.femalewith a hx of CAD with CABG 2014,thoracoabdominal aortic aneurysm, CKD stage III,L subclavian stenosis,severe pulmonary artery hypertension, carotid artery disease s/pbilateral CEA, history of CVA, hypertension, hyperlipidemia, PAFandDM2who is being seen today for the evaluation of chest painat the request of Dr. Evangeline Gula.  Clinical Impression  Pt admitted with above diagnosis. Pt currently with functional limitations due to the deficits listed below (see PT Problem List). Pt was able to ambulate with RW with good steady gait with good safety awareness as well.  Pt used cane at home therefore need to work on stability with cane to return to prior functional level.  Pt can use RW at home if needed as well.  Will progress pt as able.  Will follow acutely.   Pt will benefit from skilled PT to increase their independence and safety with mobility to allow discharge to the venue listed below.    Sitting BP 137/71, 76 bpm, (91) Standing BP 115/73, 85 bpm (86) Standing BP after 3 min 123/91 (103) At end of treatment in sitting 118/71, 93 bpm (86) Pt with no symptoms of dizziness with activity.  Follow Up Recommendations Home health PT;Supervision/Assistance - 24 hour    Equipment Recommendations  None recommended by PT    Recommendations for Other Services       Precautions / Restrictions Precautions Precautions: Fall Precaution Comments: prefers to walk with RW Restrictions Weight Bearing Restrictions: No      Mobility  Bed Mobility               General bed mobility comments: up in chair on arrival   Transfers Overall transfer level: Needs assistance Equipment used: Rolling walker (2 wheeled) Transfers: Sit to/from Stand Sit to Stand: Min guard         General transfer comment: Cues for  hand placement to and from seated surface.   Ambulation/Gait Ambulation/Gait assistance: Min Gaffer (Feet): 250 Feet Assistive device: Rolling walker (2 wheeled);1 person hand held assist Gait Pattern/deviations: Step-through pattern;Decreased stride length Gait velocity: reduced Gait velocity interpretation: <1.31 ft/sec, indicative of household ambulator General Gait Details: Cues for RW safety with turns and to stay close to RW as she fatigues.  Also ambulated with HHA of 1  with min guard assist with pt not relying on UE support with guarded gait.  No LOB but no challenges given to balance.  Pt feels safer with RW today per pt report.    Stairs            Wheelchair Mobility    Modified Rankin (Stroke Patients Only)       Balance Overall balance assessment: Needs assistance         Standing balance support: Bilateral upper extremity supported;During functional activity Standing balance-Leahy Scale: Fair Standing balance comment: Can stand statically to get tissues at sink and does not have to have UE support at all times.  Overall good safety awareness.                              Pertinent Vitals/Pain Pain Assessment: No/denies pain    Home Living Family/patient expects to be discharged to:: Private residence Living Arrangements: Children Available Help at Discharge: Family;Friend(s);Available 24 hours/day(daughter lives with pt) Type of Home: House Home Access: Level entry  Home Layout: One level Home Equipment: Grab bars - toilet;Grab bars - tub/shower;Hand held shower head;Shower seat - built in;Walker - 4 wheels;Cane - single point;Walker - 2 wheels      Prior Function Level of Independence: Independent with assistive device(s)         Comments: used cane most of time per pt and RW if feeling bad.  B/D self, Daughter provides S for bath, daughter fixes her pills each week and makes meals.  Daughter lives wtih  pt     Hand Dominance   Dominant Hand: Right    Extremity/Trunk Assessment   Upper Extremity Assessment Upper Extremity Assessment: Defer to OT evaluation    Lower Extremity Assessment Lower Extremity Assessment: Generalized weakness    Cervical / Trunk Assessment Cervical / Trunk Assessment: Normal  Communication   Communication: No difficulties  Cognition Arousal/Alertness: Awake/alert Behavior During Therapy: WFL for tasks assessed/performed Overall Cognitive Status: Within Functional Limits for tasks assessed                                        General Comments      Exercises     Assessment/Plan    PT Assessment Patient needs continued PT services  PT Problem List Decreased activity tolerance;Decreased balance;Decreased mobility;Decreased knowledge of use of DME;Decreased safety awareness;Decreased knowledge of precautions       PT Treatment Interventions Gait training;Functional mobility training;Therapeutic activities;Therapeutic exercise;DME instruction;Balance training;Patient/family education    PT Goals (Current goals can be found in the Care Plan section)  Acute Rehab PT Goals Patient Stated Goal: to get stronger PT Goal Formulation: With patient Time For Goal Achievement: 09/22/18 Potential to Achieve Goals: Good    Frequency Min 3X/week   Barriers to discharge        Co-evaluation               AM-PAC PT "6 Clicks" Daily Activity  Outcome Measure Difficulty turning over in bed (including adjusting bedclothes, sheets and blankets)?: Unable Difficulty moving from lying on back to sitting on the side of the bed? : Unable Difficulty sitting down on and standing up from a chair with arms (e.g., wheelchair, bedside commode, etc,.)?: Unable Help needed moving to and from a bed to chair (including a wheelchair)?: A Little Help needed walking in hospital room?: A Little Help needed climbing 3-5 steps with a railing? : A  Little 6 Click Score: 12    End of Session Equipment Utilized During Treatment: Gait belt Activity Tolerance: Patient tolerated treatment well;Patient limited by fatigue;Treatment limited secondary to medical complications (Comment) Patient left: with call bell/phone within reach;in chair Nurse Communication: Mobility status PT Visit Diagnosis: Unsteadiness on feet (R26.81);Muscle weakness (generalized) (M62.81)    Time: 1610-9604 PT Time Calculation (min) (ACUTE ONLY): 30 min   Charges:   PT Evaluation $PT Eval Moderate Complexity: 1 Mod PT Treatments $Gait Training: 8-22 mins        [place] Pager:  [phone]  Office:  [place] 09/09/2018, 11:38 AM

## 2018-09-09 NOTE — Care Management Note (Signed)
Case Management Note  Patient Details  Name: Kendra Todd MRN: [id number] Date of Birth: 05/05/1941  Subjective/Objective:  Chest Pain                 Action/Plan: Patient recently discharged from [facility] hospital with [facility] services provided by [facility]; PCP: [name], DO; has private insurance with Medicare. DME - walker at home.  Expected Discharge Date:  09/09/18               Expected Discharge Plan:  [place]  Discharge planning Services  CM Consult    HH Arranged:  PT [facility] Agency:  [place]  Status of Service:  In process, will continue to follow  Sherrilyn Rist [phone] 09/09/2018, 3:42 PM

## 2018-09-09 NOTE — Progress Notes (Addendum)
Progress Note  Patient Name: Kendra Todd Date of Encounter: 09/09/2018  Primary Cardiologist: [name], MD   Subjective   No significant overnight events. Patient denies any more chest pain. She reports some shortness on breath while ambulating in the halls. She continues to have diarrhea but reports her nausea/vomiting has resolved. Appetite is improving. She is be tested for C. Diff.  Inpatient Medications    Scheduled Meds: . carvedilol  12.5 mg Oral BID WC  . clopidogrel  75 mg Oral Q breakfast  . enoxaparin (LOVENOX) injection  40 mg Subcutaneous QHS  . ezetimibe  10 mg Oral QHS  . feeding supplement (ENSURE ENLIVE)  237 mL Oral TID BM  . fluconazole  100 mg Oral Daily  . hydrALAZINE  5 mg Intravenous Once  . insulin aspart  0-9 Units Subcutaneous Q4H  . latanoprost  1 drop Both Eyes QHS  . metroNIDAZOLE  500 mg Oral Q12H  . multivitamin with minerals  1 tablet Oral Daily  . pantoprazole  80 mg Oral BID  . polyethylene glycol  17 g Oral BID  . rosuvastatin  40 mg Oral Daily   Continuous Infusions:  PRN Meds: acetaminophen, dicyclomine, morphine injection, ondansetron (ZOFRAN) IV   Vital Signs    Vitals:   09/08/18 1948 09/09/18 0030 09/09/18 0412 09/09/18 0854  BP: 129/66 130/72 (!) 150/69 109/73  Pulse: 73 72 67 86  Resp: 18 16 18    Temp: 98.6 F (37 C) 98 F (36.7 C) 97.8 F (36.6 C)   TempSrc: Oral Oral Oral   SpO2: 100% 100% 98%   Weight:   62.4 kg   Height:        Intake/Output Summary (Last 24 hours) at 09/09/2018 1018 Last data filed at 09/09/2018 0500 Gross per 24 hour  Intake 180 ml  Output 801 ml  Net -621 ml   Filed Weights   09/07/18 2300 09/08/18 0237 09/09/18 0412  Weight: 61.6 kg 61.6 kg 62.4 kg    Telemetry    Sinus rhythm with PACs - Personally Reviewed  Physical Exam   GEN: 77 year old female sitting comfortably in chair. Alert and in no acute distress.   Neck: Supple. Cardiac: RRR. No murmurs, rubs, or gallops.    Respiratory: No increased work of breathing. Clear to auscultation bilaterally. GI: Abdomen soft, nontender, non-distended.  MS: No significant lower extremity edema. Neuro:  No focal deficits. Psych: Normal affect.  Labs    Chemistry Recent Labs  Lab 09/04/18 1234 09/07/18 2301  NA 135 131*  K 4.1 4.0  CL 94* 97*  CO2 28 27  GLUCOSE 119 103*  BUN 16 13  CREATININE 1.89* 1.63*  CALCIUM 9.7 9.2  PROT 6.8  --   AST 25  --   ALT 11  --   BILITOT 0.6  --   GFRNONAA 25* 29*  GFRAA 29* 33*  ANIONGAP  --  7     Hematology Recent Labs  Lab 09/04/18 1234 09/07/18 2301  WBC 8.2 7.6  RBC 4.49 4.50  HGB 11.0* 11.0*  HCT 35.3 35.5*  MCV 78.6* 78.9*  MCH 24.5* 24.4*  MCHC 31.2* 31.0  RDW 18.9* 21.2*  PLT 344 247    Cardiac Enzymes Recent Labs  Lab 09/08/18 0236 09/08/18 0553  TROPONINI <0.03 <0.03    Recent Labs  Lab 09/07/18 2259  TROPIPOC 0.01     BNPNo results for input(s): BNP, PROBNP in the last 168 hours.   DDimer  No results for input(s): DDIMER in the last 168 hours.   Radiology    Dg Chest 2 View  Result Date: 09/07/2018 CLINICAL DATA:  Left chest pain since 1700 hours. Radiates to left shoulder. Shortness of breath. History of blood clots and cardiac history. EXAM: CHEST - 2 VIEW COMPARISON:  08/26/2018 FINDINGS: Postoperative changes in the mediastinum. Mild cardiac enlargement. No vascular congestion, edema, or consolidation in the lungs. No blunting of costophrenic angles. Tortuous and dilated aorta. IMPRESSION: Mild cardiac enlargement. No evidence of active pulmonary disease. Tortuous and dilated aorta. Electronically Signed   By: Lucienne Capers M.D.   On: 09/07/2018 23:39   Ct Head Wo Contrast  Result Date: 09/08/2018 CLINICAL DATA:  Generalized weakness. Headache. EXAM: CT HEAD WITHOUT CONTRAST TECHNIQUE: Contiguous axial images were obtained from the base of the skull through the vertex without intravenous contrast. COMPARISON:  Neck CTA  03/06/2012. FINDINGS: Brain: There is no evidence of acute infarct, intracranial hemorrhage, midline shift, or extra-axial fluid collection. A chronic lacunar infarct is noted in the right caudate nucleus. There is also a small chronic posterior right frontal cortical infarct. Cerebral white matter hypodensities are nonspecific but compatible with mild chronic small vessel ischemic disease. There is mild cerebral atrophy. An 8 mm extra-axial mass is noted in the right frontoparietal region without mass effect on the adjacent brain. Vascular: Calcified atherosclerosis at the skull base. No hyperdense vessel. Partially visualized right ICA atherosclerosis and mild dilatation in the neck with history of endarterectomy and dissects in flat shown on the prior CTA. Skull: No fracture or focal osseous lesion. Sinuses/Orbits: Paranasal sinuses and mastoid air cells are clear. Right cataract extraction. Other: None. IMPRESSION: 1. No evidence of acute intracranial abnormality. 2. Mild chronic small vessel ischemic disease and chronic infarcts as above. 3. 8 mm right frontoparietal extra-axial mass, likely an incidental meningioma. Electronically Signed   By: Logan Bores M.D.   On: 09/08/2018 00:51   Mr Brain Wo Contrast  Result Date: 09/08/2018 CLINICAL DATA:  Right arm weakness over the last week. EXAM: MRI HEAD WITHOUT CONTRAST TECHNIQUE: Multiplanar, multiecho pulse sequences of the brain and surrounding structures were obtained without intravenous contrast. COMPARISON:  CT same day FINDINGS: Brain: Diffusion imaging does not show any acute or subacute infarction. There chronic small-vessel ischemic changes of the pons. There are a few old small vessel cerebellar infarctions. Cerebral hemispheres show mild chronic small-vessel ischemic change of the deep and subcortical white matter. There is an old right posterior parietal cortical and subcortical infarction. No evidence of intra-axial mass lesion, hemorrhage,  hydrocephalus or extra-axial collection. There is a 7 mm meningioma at the right parietal vertex without any mass-effect upon the brain. Vascular: Flow is present within the major vessels at the base of the brain, with exception of the distal left vertebral artery, which does not show demonstrable flow. This vessel is probably occluded after PICA. Skull and upper cervical spine: Negative Sinuses/Orbits: Clear/normal Other: None IMPRESSION: No acute or subacute insult. Chronic small-vessel ischemic changes of the pons, cerebellum and cerebral hemispheric white matter. Old right parietal cortical and subcortical infarction, small. 7 mm meningioma at the right frontoparietal vertex, not likely of any clinical significance. Apparent absent flow within the distal left vertebral artery, which is probably occluded after PICA. Right vertebral artery widely patent to the basilar. Electronically Signed   By: Nelson Chimes M.D.   On: 09/08/2018 12:00   Nm Myocar Multi W/spect W/wall Motion / Ef  Result Date: 09/08/2018  CLINICAL DATA:  77 year old female with chest pain. History of myocardial infarction, atrial fibrillation, previous CABG, smoking history, diabetes and hypertension. EXAM: MYOCARDIAL IMAGING WITH SPECT (REST AND PHARMACOLOGIC-STRESS) GATED LEFT VENTRICULAR WALL MOTION STUDY LEFT VENTRICULAR EJECTION FRACTION TECHNIQUE: Standard myocardial SPECT imaging was performed after resting intravenous injection of 10 mCi Tc-7m tetrofosmin. Subsequently, intravenous infusion of Lexiscan was performed under the supervision of the Cardiology staff. At peak effect of the drug, 30 mCi Tc-10m tetrofosmin was injected intravenously and standard myocardial SPECT imaging was performed. Quantitative gated imaging was also performed to evaluate left ventricular wall motion, and estimate left ventricular ejection fraction. COMPARISON:  None. FINDINGS: Perfusion: No decreased activity in the left ventricle on stress imaging to  suggest reversible ischemia or infarction. Wall Motion: Septal hypokinesis noted. Left Ventricular Ejection Fraction: 54 % End diastolic volume 71 ml End systolic volume 33 ml IMPRESSION: 1. No reversible ischemia or infarction. 2. Septal hypokinesis. 3. Left ventricular ejection fraction 54% 4. Non invasive risk stratification*: Low *2012 Appropriate Use Criteria for Coronary Revascularization Focused Update: J Am Coll Cardiol. 7062;37(6):283-151. [email or web address] Electronically Signed   By: Margarette Canada M.D.   On: 09/08/2018 17:28    Cardiac Studies   Myoview 08/08/2018: Findings: Perfusion: No decreased activity in the left ventricle on stress imaging to suggest reversible ischemia or infarction. Wall Motion: Septal hypokinesis noted. Left Ventricular Ejection Fraction: 54 % End diastolic volume 71 ml End systolic volume 33 ml  Impression: 1. No reversible ischemia or infarction. 2. Septal hypokinesis. 3. Left ventricular ejection fraction 54% 4. Non invasive risk stratification*: Low _______________  Echocardiogram 02/03/2018: Study Conclusions: - Left ventricle: The cavity size was normal. Wall thickness was   increased in a pattern of moderate LVH. Systolic function was   normal. The estimated ejection fraction was in the range of 60%   to 65%. Wall motion was normal; there were no regional wall   motion abnormalities. Features are consistent with a pseudonormal   left ventricular filling pattern, with concomitant abnormal   relaxation and increased filling pressure (grade 2 diastolic   dysfunction). - Aortic valve: Moderately calcified annulus. - Left atrium: The atrium was severely dilated. - Tricuspid valve: There was mild-moderate regurgitation. - Pulmonary arteries: Systolic pressure was moderately increased.   PA peak pressure: 49 mm Hg (S).  Patient Profile     Kendra Todd is a 77 y.o. female with a hx of CAD with CABG  2014, thoracoabdominal aortic aneurysm, CKD stage III,L subclavian stenosis,severe pulmonary artery hypertension, carotid artery disease s/pbilateral CEA, history of CVA, hypertension, hyperlipidemia, PAFandDM2 who is being seen today for the evaluation of chest pain at the request of Dr. Evangeline Gula.  Assessment & Plan    1. Chest Pain with History of CAD - Patient presented complaining of sharp pain in chest and left shoulder.   - EKG showed sinus rhythm with non-specific T wave abnormalities. - Troponin negative x3.  - Patient denies any angina yesterday or today. - Myoview yesterday showed no reversible ischemia or infarction and was determined to a low-risk study. No further ischemic evaluation necessary at this time.  2. CAD s/p CABG in 2014 - Continue Coreg 12.5mg  twice daily. - Continue Plavix 75mg  daily given PAD. - Continue Crestor 40mg  daily and Zetia 10mg  daily.  3. Paroxsymal Atrial Fibrillation - Currently in sinus rhythm on telemetry. - Continue Coreg as above. - Not on anticoagulation due to anemia and GI bleed.   For questions or updates, please contact [place]  [facility] Please consult [email or web address] for contact info under        Signed, Darreld Mclean, PA-C  09/09/2018, 10:18 AM    I have examined the patient and reviewed assessment and plan and discussed with patient.  Agree with above as stated.  No ischemia on myoview.  Continue aggressive secondary prevention.   Larae Grooms

## 2018-09-09 NOTE — Discharge Summary (Signed)
Physician Discharge Summary  Kendra Todd IRS:[id number] DOB: 08-Feb-1941 DOA: 09/07/2018  PCP: [name], DO  Admit date: 09/07/2018 Discharge date: 09/09/2018  Time spent: 40 minutes  Recommendations for Outpatient Follow-up:  1. Follow up with PCP for post hospitalization visit. Evaluate BP control  2. Home health PT    Discharge Diagnoses:  Principal Problem:   Chest pain, rule out acute myocardial infarction Active Problems:   CAD (coronary artery disease): 1993 PTCA to circumflex.  2002 2 BMS stents to ostial, S/P CABG x 3 12/04/12   DM (diabetes mellitus), type 2 with renal complications (HCC)   HTN (hypertension)   CKD (chronic kidney disease) stage 3, GFR 30-59 ml/min (HCC)   History of stent insertion of renal artery   Thoracoabdominal aortic aneurysm (TAAA) without rupture [facility])   Discharge Condition: stable  Diet recommendation: heart healthy  Filed Weights   09/07/18 2300 09/08/18 0237 09/09/18 0412  Weight: 61.6 kg 61.6 kg 62.4 kg    History of present illness:  Pt with hx of HTN, RAS s/p stents, CAD s/p stents PAF, DM, stroke admitted 09/08/18 after presenting to ER 09/07/18 with Lt sided chest pain.BP at home was 009 systolic, presented by EMS and 3 NTG given en route. Pain did radiate in to lt shoulder and she did have SOB. She did have some relief with the NTG. Then with GI cocktail and morphine. Of note recently discharged and anti-hypertensive meds stopped due to hypotension in hospital as well as orthostatic hypotension.   Hospital Course:  1. Chest pain- no pressure. Resolved at discharge neg MI. evlauted by cardiology who opine possible BP elevated causing pain. lexiscan myoview neg for ischemia and determined low risk study. Cardiology recommend no further work up 2. Hx CAD and CABGin 2014--last nuc prior to CABG. see above. Continue home meds. Coreg 12.5mg  bid added. Continue plavix and crestor and zetia 3. Thoracoabdominal aortic aneurysm  slightly larger on last CT angio 4. CKD III  Appears stable 5. Iron def anemia on plavix due to recent renal stents. 6. Hx GI bleed. No s/sx bleeding. Continue plavix 7. bil renal artery stenosis with stents placed 07/21/18 8. PAF currently in SR home meds include plavix. SR on tele at discharge. Coreg as noted above. Hx anemia and gi bleed 9. HTN. Fair control. Continue home med 10. PAD with CEAs and Lt subclavian artery stensosis - asymptomatic.  11. DM2 A1c 6.1 4 days ago. On oral agents. Serum glucose 103. Will use SSI for optimal control. 12. Numbness right arm. Mri neg acute infarct. Flexion extension films of C-spine obtained as hx arthritis. Mild degeneration c5-6. Follow up with PCP home PT   Procedures:  myoview  Consultations:  varanasi cards  Discharge Exam: Vitals:   09/09/18 0854 09/09/18 1426  BP: 109/73 133/75  Pulse: 86 68  Resp:  18  Temp:  98.2 F (36.8 C)  SpO2:  100%    General: sitting in chair eating breakfast no acute distress Cardiovascular: RRR no mgr no LE edema Respiratory: normal effort good air movement BS clear bilaterally no wheeze  Discharge Instructions   Discharge Instructions    Diet - low sodium heart healthy   Complete by:  As directed    Discharge instructions   Complete by:  As directed    Follow up with PCP 1-2 weeks for post hospitalization visit. Recommend evaluation of BP control Take lebaquin and flagyl for 1 more day. (09/10/18 last day   Increase activity  slowly   Complete by:  As directed      Allergies as of 09/09/2018      Reactions   Iron Hives   Vicodin [hydrocodone-acetaminophen] Other (See Comments)   Caused arrythmia    Amiodarone Nausea And Vomiting   Norvasc [amlodipine Besylate] Nausea And Vomiting   Promethazine Other (See Comments)   hallucinations   Vioxx [rofecoxib] Nausea And Vomiting      Medication List    TAKE these medications   alendronate 70 MG tablet Commonly known as:  FOSAMAX Take  70 mg by mouth every Friday.   bimatoprost 0.01 % Soln Commonly known as:  LUMIGAN Place 1 drop into both eyes at bedtime.   carvedilol 12.5 MG tablet Commonly known as:  COREG Take 1 tablet (12.5 mg total) by mouth 2 (two) times daily with a meal.   clopidogrel 75 MG tablet Commonly known as:  PLAVIX Take 1 tablet (75 mg total) by mouth daily with breakfast.   dicyclomine 10 MG capsule Commonly known as:  BENTYL Take 3 times daily as needed for spasm. What changed:    how much to take  how to take this  when to take this  reasons to take this  additional instructions   ezetimibe 10 MG tablet Commonly known as:  ZETIA Take 10 mg by mouth at bedtime.   fluconazole 100 MG tablet Commonly known as:  DIFLUCAN Take 1 tablet (100 mg total) by mouth daily.   glucose blood test strip Use as instructed to test blood sugar twice daily DX: E11.29   levofloxacin 500 MG tablet Commonly known as:  LEVAQUIN Take 1 tablet (500 mg total) by mouth every other day for 10 days.   metFORMIN 1000 MG tablet Commonly known as:  GLUCOPHAGE TAKE 1 TABLET TWICE A DAY WITH MEALS   metroNIDAZOLE 500 MG tablet Commonly known as:  FLAGYL Take 1 tablet (500 mg total) by mouth every 12 (twelve) hours for 11 days.   nitroGLYCERIN 0.4 MG SL tablet Commonly known as:  NITROSTAT Place 1 tablet (0.4 mg total) under the tongue every 5 (five) minutes as needed for chest pain.   omeprazole 40 MG capsule Commonly known as:  PRILOSEC Take 1 capsule (40 mg total) by mouth 2 (two) times daily.   ondansetron 8 MG disintegrating tablet Commonly known as:  ZOFRAN-ODT Take 1 tablet (8 mg total) by mouth every 8 (eight) hours as needed for nausea or vomiting.   polyethylene glycol packet Commonly known as:  MIRALAX / GLYCOLAX Take 17 g by mouth 2 (two) times daily.   rosuvastatin 40 MG tablet Commonly known as:  CRESTOR Take one tablet by mouth once daily for cholesterol What changed:    how  much to take  how to take this  when to take this  additional instructions   traMADol 50 MG tablet Commonly known as:  ULTRAM Take 1 tablet (50 mg total) by mouth every 8 (eight) hours as needed for up to 5 days (for pain.).      Allergies  Allergen Reactions  . Iron Hives  . Vicodin [Hydrocodone-Acetaminophen] Other (See Comments)    Caused arrythmia   . Amiodarone Nausea And Vomiting  . Norvasc [Amlodipine Besylate] Nausea And Vomiting  . Promethazine Other (See Comments)    hallucinations  . Vioxx [Rofecoxib] Nausea And Vomiting      The results of significant diagnostics from this hospitalization (including imaging, microbiology, ancillary and laboratory) are listed below for reference.  Significant Diagnostic Studies: Dg Chest 2 View  Result Date: 09/07/2018 CLINICAL DATA:  Left chest pain since 1700 hours. Radiates to left shoulder. Shortness of breath. History of blood clots and cardiac history. EXAM: CHEST - 2 VIEW COMPARISON:  08/26/2018 FINDINGS: Postoperative changes in the mediastinum. Mild cardiac enlargement. No vascular congestion, edema, or consolidation in the lungs. No blunting of costophrenic angles. Tortuous and dilated aorta. IMPRESSION: Mild cardiac enlargement. No evidence of active pulmonary disease. Tortuous and dilated aorta. Electronically Signed   By: Lucienne Capers M.D.   On: 09/07/2018 23:39   Ct Head Wo Contrast  Result Date: 09/08/2018 CLINICAL DATA:  Generalized weakness. Headache. EXAM: CT HEAD WITHOUT CONTRAST TECHNIQUE: Contiguous axial images were obtained from the base of the skull through the vertex without intravenous contrast. COMPARISON:  Neck CTA 03/06/2012. FINDINGS: Brain: There is no evidence of acute infarct, intracranial hemorrhage, midline shift, or extra-axial fluid collection. A chronic lacunar infarct is noted in the right caudate nucleus. There is also a small chronic posterior right frontal cortical infarct. Cerebral white  matter hypodensities are nonspecific but compatible with mild chronic small vessel ischemic disease. There is mild cerebral atrophy. An 8 mm extra-axial mass is noted in the right frontoparietal region without mass effect on the adjacent brain. Vascular: Calcified atherosclerosis at the skull base. No hyperdense vessel. Partially visualized right ICA atherosclerosis and mild dilatation in the neck with history of endarterectomy and dissects in flat shown on the prior CTA. Skull: No fracture or focal osseous lesion. Sinuses/Orbits: Paranasal sinuses and mastoid air cells are clear. Right cataract extraction. Other: None. IMPRESSION: 1. No evidence of acute intracranial abnormality. 2. Mild chronic small vessel ischemic disease and chronic infarcts as above. 3. 8 mm right frontoparietal extra-axial mass, likely an incidental meningioma. Electronically Signed   By: Logan Bores M.D.   On: 09/08/2018 00:51   Mr Brain Wo Contrast  Result Date: 09/08/2018 CLINICAL DATA:  Right arm weakness over the last week. EXAM: MRI HEAD WITHOUT CONTRAST TECHNIQUE: Multiplanar, multiecho pulse sequences of the brain and surrounding structures were obtained without intravenous contrast. COMPARISON:  CT same day FINDINGS: Brain: Diffusion imaging does not show any acute or subacute infarction. There chronic small-vessel ischemic changes of the pons. There are a few old small vessel cerebellar infarctions. Cerebral hemispheres show mild chronic small-vessel ischemic change of the deep and subcortical white matter. There is an old right posterior parietal cortical and subcortical infarction. No evidence of intra-axial mass lesion, hemorrhage, hydrocephalus or extra-axial collection. There is a 7 mm meningioma at the right parietal vertex without any mass-effect upon the brain. Vascular: Flow is present within the major vessels at the base of the brain, with exception of the distal left vertebral artery, which does not show demonstrable  flow. This vessel is probably occluded after PICA. Skull and upper cervical spine: Negative Sinuses/Orbits: Clear/normal Other: None IMPRESSION: No acute or subacute insult. Chronic small-vessel ischemic changes of the pons, cerebellum and cerebral hemispheric white matter. Old right parietal cortical and subcortical infarction, small. 7 mm meningioma at the right frontoparietal vertex, not likely of any clinical significance. Apparent absent flow within the distal left vertebral artery, which is probably occluded after PICA. Right vertebral artery widely patent to the basilar. Electronically Signed   By: Nelson Chimes M.D.   On: 09/08/2018 12:00   Nm Myocar Multi W/spect W/wall Motion / Ef  Result Date: 09/08/2018 CLINICAL DATA:  77 year old female with chest pain. History of myocardial infarction, atrial  fibrillation, previous CABG, smoking history, diabetes and hypertension. EXAM: MYOCARDIAL IMAGING WITH SPECT (REST AND PHARMACOLOGIC-STRESS) GATED LEFT VENTRICULAR WALL MOTION STUDY LEFT VENTRICULAR EJECTION FRACTION TECHNIQUE: Standard myocardial SPECT imaging was performed after resting intravenous injection of 10 mCi Tc-12m tetrofosmin. Subsequently, intravenous infusion of Lexiscan was performed under the supervision of the Cardiology staff. At peak effect of the drug, 30 mCi Tc-5m tetrofosmin was injected intravenously and standard myocardial SPECT imaging was performed. Quantitative gated imaging was also performed to evaluate left ventricular wall motion, and estimate left ventricular ejection fraction. COMPARISON:  None. FINDINGS: Perfusion: No decreased activity in the left ventricle on stress imaging to suggest reversible ischemia or infarction. Wall Motion: Septal hypokinesis noted. Left Ventricular Ejection Fraction: 54 % End diastolic volume 71 ml End systolic volume 33 ml IMPRESSION: 1. No reversible ischemia or infarction. 2. Septal hypokinesis. 3. Left ventricular ejection fraction 54% 4. Non  invasive risk stratification*: Low *2012 Appropriate Use Criteria for Coronary Revascularization Focused Update: J Am Coll Cardiol. 9924;26(8):341-962. [email or web address] Electronically Signed   By: Margarette Canada M.D.   On: 09/08/2018 17:28   Dg Chest Port 1 View  Result Date: 08/26/2018 CLINICAL DATA:  Shortness of breath EXAM: PORTABLE CHEST 1 VIEW COMPARISON:  CT chest 05/28/2018 FINDINGS: Diffuse bilateral interstitial thickening. Small left pleural effusion. No pneumothorax. No focal consolidation. Stable cardiomegaly. Prior CABG. Tortuous aorta. No acute osseous abnormality. IMPRESSION: Findings concerning for mild CHF. Electronically Signed   By: Kathreen Devoid   On: 08/26/2018 10:05   Dg Cerv Spine Flex&ext Only  Result Date: 09/09/2018 CLINICAL DATA:  77 year old female with numbness and weakness right arm for 2 weeks. No injury. History of arthritis. Initial encounter. EXAM: CERVICAL SPINE - FLEXION AND EXTENSION VIEWS ONLY COMPARISON:  03/06/2012 CT angiogram of the neck. FINDINGS: Very mild C5-6 disc space narrowing with surrounding osteophyte. No abnormal motion occurs between flexion and extension. C7-T1 not visualized secondary to overlying shoulders. Two-view exam without evidence of fracture, malalignment or abnormal prevertebral soft tissue swelling. IMPRESSION: 1. Very mild C5-6 disc space narrowing. 2. No abnormal motion between flexion and extension. Electronically Signed   By: Genia Del M.D.   On: 09/09/2018 12:49   Dg Abd 2 Views  Result Date: 08/25/2018 CLINICAL DATA:  Abdominal pain for 1 week EXAM: ABDOMEN - 2 VIEW COMPARISON:  05/28/2018 FINDINGS: Scattered large and small bowel gas is noted. No abnormal mass or abnormal calcifications are seen. No obstructive changes are noted. Bilateral renal stents are seen. Mild degenerative change of the lumbar spine is noted. IMPRESSION: No acute abnormality noted. Electronically Signed   By:  Inez Catalina M.D.   On: 08/25/2018 08:19   Vas US Renal Artery Duplex  Result Date: 08/18/2018 ABDOMINAL VISCERAL Indications: PAD, HTN, s/p stent High Risk Factors: Hypertension, hyperlipidemia, Diabetes, past history of                    smoking, coronary artery disease. Other Factors: CKD and AAA. Vascular Interventions: S/p bilateral PTA/stent placed in both renal arteries in                         9/19. Comparison Study: In 7/19, CT showed ascending thoracic aorta was normal caliber                   and dilatation of descending thoracic aorta was 4.4 cm.  Suprarenal aorta measured 4 cm with plaque and infrarenal                   aorta was 3.4 cm. In 8/19, visceral duplex showed >70%                   stenosis in the celiac axis and SMA. >60% stenosis in both                   renal arteries. Performing Technologist: Guinevere Ferrari RVT Supporting Technologist: Wilkie Aye RVT  Examination Guidelines: A complete evaluation includes B-mode imaging, spectral Doppler, color Doppler, and power Doppler as needed of all accessible portions of each vessel. Bilateral testing is considered an integral part of a complete examination. Limited examinations for reoccurring indications may be performed as noted.  Duplex Findings: +--------------------+--------+--------+--------+--------+ Mesenteric          PSV cm/sEDV cm/s Plaque Comments +--------------------+--------+--------+--------+--------+ Aorta Prox             70      17                    +--------------------+--------+--------+--------+--------+ Aorta Distal           84      12   calcific         +--------------------+--------+--------+--------+--------+ Celiac Artery Origin  182                            +--------------------+--------+--------+--------+--------+ SMA Origin            403      58                    +--------------------+--------+--------+--------+--------+ Mesenteric Technologist observations:  Patent IVC and both renal veins.  +------------------+--------+--------+-------+ Right Renal ArteryPSV cm/sEDV cm/sComment +------------------+--------+--------+-------+ Origin              198      32           +------------------+--------+--------+-------+ Proximal            285      45           +------------------+--------+--------+-------+ Mid                  79      14           +------------------+--------+--------+-------+ Distal               83      15           +------------------+--------+--------+-------+ +-----------------+--------+--------+-------+ Left Renal ArteryPSV cm/sEDV cm/sComment +-----------------+--------+--------+-------+ Origin             190      4            +-----------------+--------+--------+-------+ Proximal           182      17           +-----------------+--------+--------+-------+ Mid                 63      8            +-----------------+--------+--------+-------+ Distal              40      9            +-----------------+--------+--------+-------+ +------------+--------+--------+----+-----------+--------+--------+----+ Right KidneyPSV cm/sEDV cm/sRI  Left  KidneyPSV cm/sEDV cm/sRI   +------------+--------+--------+----+-----------+--------+--------+----+ Upper Pole  24      7       0.72Upper Pole 31      7       0.78 +------------+--------+--------+----+-----------+--------+--------+----+ Mid         22      6       0.73Mid        23      7       0.68 +------------+--------+--------+----+-----------+--------+--------+----+ Lower Pole  23      9       0.64Lower Pole 27      6       0.79 +------------+--------+--------+----+-----------+--------+--------+----+ Hilar       76      9       0.88Hilar      63      11      0.83 +------------+--------+--------+----+-----------+--------+--------+----+ +------------------+-------+------------------+-------+ Right Kidney             Left  Kidney               +------------------+-------+------------------+-------+ RAR                      RAR                       +------------------+-------+------------------+-------+ RAR (manual)      4.07   RAR (manual)      2.71    +------------------+-------+------------------+-------+ Cortex                   Cortex                    +------------------+-------+------------------+-------+ Cortex thickness  0.92 mmCorex thickness   0.73 mm +------------------+-------+------------------+-------+ Kidney length (cm)9.11   Kidney length (cm)9.19    +------------------+-------+------------------+-------+  Summary: Largest Aortic Diameter: 4.1 cm  Renal:  Right: Normal size right kidney. Evidence of a greater than 60%        stenosis of the right renal artery. Abnormal right Resistive        Index. RRV flow present. Normal cortical thickness of right        kidney. Left:  Normal size of left kidney. 1-59% stenosis of the left renal        artery. Abnormal left Resisitve Index. LRV flow present.        Normal cortical thickness of the left kidney. Mesenteric: Normal Celiac artery findings. 70 to 99% stenosis in the superior mesenteric artery.  *See table(s) above for measurements and observations.  Suggest follow up study in 12 months.  Diagnosing physician: Quay Burow MD  Electronically signed by Quay Burow MD on 08/18/2018 at 3:58:05 PM.    Final    Ct Angio Abd/pel W/ And/or W/o  Result Date: 08/27/2018 CLINICAL DATA:  Abdominal pain. Unintentional weight loss. Evaluate for mesenteric ischemia. EXAM: CTA ABDOMEN AND PELVIS WITH CONTRAST TECHNIQUE: Multidetector CT imaging of the abdomen and pelvis was performed using the standard protocol during bolus administration of intravenous contrast. Multiplanar reconstructed images and MIPs were obtained and reviewed to evaluate the vascular anatomy. CONTRAST:  19mL ISOVUE-370 IOPAMIDOL (ISOVUE-370) INJECTION 76% COMPARISON:  CT the  chest, abdomen pelvis-05/28/2018; 01/19/2016; 07/28/2015 FINDINGS: VASCULAR Aorta: Aneurysmal dilatation of the imaged distal aspect of the descending thoracic aorta measuring approximately 39 mm in diameter (image 1, series 12, similar to the 01/2016 examination. Large amount of irregular mixed calcified though predominantly noncalcified  atherosclerotic plaque throughout the abdominal aorta, not definitely resulting in hemodynamically significant stenosis. Interval increase in size of aneurysmal dilatation of the infrarenal abdominal aorta measuring approximately 3.6 x 3.6 x 3.2 cm in greatest oblique short axis axial (image 57, series 6), coronal (image 59, series 10) and sagittal (image 99, series 9) dimensions respectively, previously, 2.8 x 3.5 x 3.2 cm. Grossly unchanged appearance of thrombosed approximately 1.3 x 0.7 cm penetrating atherosclerotic ulcer arising from the medial aspect of the infrarenal abdominal aorta (image 53, series 6). Short-segment dissections within the distal aspect of the abdominal aorta (images 66, series 6 and image 70, series 6), similar to the 01/2016 examination and again not definitely resulting in a hemodynamically significant stenosis. No periaortic stranding. Celiac: There is a minimal amount of mixed calcified and noncalcified atherosclerotic plaque involving the origin the celiac artery, not resulting in a hemodynamically significant stenosis. Conventional branching pattern. SMA: Moderate to large amount of predominantly noncalcified atherosclerotic plaque involving the origin of the SMA resulting in at least 50% luminal narrowing (axial image 43, series 6; sagittal image 102, series 9). Conventional branching pattern. The distal tributaries of the SMA appear widely patent without discrete intraluminal filling defect to suggest distal embolism. Renals: Solitary bilaterally patient has undergone stenting of the origin of the bilateral renal arteries the patency of which is  suboptimally evaluated secondary to streak artifact. The remainder the right renal artery appears patent without definitive hemodynamically significant stenosis or vessel irregularity to suggest FMD. The left renal artery appears somewhat atretic though supplies a slightly asymmetrically atrophic left kidney. IMA: Atretic though appears patent Inflow: There is a large amount of irregular mixed calcified and noncalcified atherosclerotic plaque involving the bilateral normal caliber common iliac arteries, not definitely resulting in a hemodynamically significant stenosis. Short-segment non flow limiting dissection within the right common iliac artery (image 79, series 6), similar to the 01/2016 examination. Suspected hemodynamically significant narrowings involving the origin of the bilateral common iliac arteries (coronal image 73, series 10). There is mild fusiform ectasia of the left internal iliac artery measuring 1 cm in diameter (image 91, series 6), similar to the 01/2016 examination. The right internal iliac artery is diseased though of normal caliber. Proximal Outflow: Mixed calcified and noncalcified atherosclerotic plaque within the left common femoral artery (images 113 12/06/2013, series 6) approaches 50% luminal narrowing. Atherosclerotic plaque within the right common femoral artery, not definitely resulting in a hemodynamically significant stenosis. The imaged portions of the bilateral superficial femoral arteries appear patent without definitive hemodynamically significant narrowing. Veins: The IVC and pelvic venous system appear widely patent. Review of the MIP images confirms the above findings. NON-VASCULAR Lower chest: Limited visualization of the lower thorax demonstrates small bilateral effusions with associated bibasilar consolidative opacities, left greater than right. Cardiomegaly.  No pericardial effusion. Hepatobiliary: There is mild nodularity hepatic contour. There is a minimal amount of  focal fatty infiltration adjacent to the fissure for ligamentum teres. No discrete hepatic lesions. Normal appearance of the gallbladder given degree distention. No intra extrahepatic bili duct dilatation. No ascites. Pancreas: Normal appearance of the pancreas. Spleen: Normal appearance of the spleen Adrenals/Urinary Tract: There is symmetric enhancement of the bilateral kidneys. The left kidney again remains atrophic in comparison to the right. No definite renal stones this postcontrast examination. No urine obstruction or perinephric stranding. No discrete renal lesions. Normal appearance of the bilateral adrenal glands. Normal appearance of the urinary bladder given degree distention. Stomach/Bowel: Moderate colonic stool burden without evidence of enteric  obstruction. Normal appearance of the terminal ileum. The appendix is not visualized, however there is no pericecal inflammatory change. No pneumoperitoneum, pneumatosis or portal venous gas. Lymphatic: No bulky retroperitoneal, mesenteric, pelvic or inguinal lymphadenopathy. Reproductive: Normal appearance the prostate gland. No free fluid in the pelvic cul-de-sac. Other: Diffuse body wall anasarca. Musculoskeletal: No acute or aggressive osseous abnormalities. Moderate DDD of L5-S1 with disc space height loss, endplate irregularity and sclerosis. Moderate severe degenerative change the bilateral hips, left greater than right. IMPRESSION: VASCULAR 1. Interval increase in size of infrarenal abdominal aortic aneurysm, measuring 3.6 x 3.6 x 3.2 cm, previously, 2.8 x 3.5 x 3.2 cm. Aortic aneurysm NOS (ICD10-I71.9). 2. Large amount of irregular mixed calcified though prominently noncalcified atherosclerotic plaque throughout the abdominal aorta including non flow limiting short-segment dissection distal aspect of the abdominal aorta, similar to the 01/2016 examination. Aortic Atherosclerosis (ICD10-I70.0). 3. At least 50% luminal narrowing involving the origin the  SMA however the celiac artery is patent without hemodynamically significant narrowing. The IMA is atretic though appears patent. 4. Suspected hemodynamically significant narrowings involving the origin of the bilateral external iliac arteries as well as the left common femoral artery. 5. Post stenting of the origin of the bilateral renal arteries. NON-VASCULAR 1. Moderate colonic stool burden without evidence of enteric obstruction. No discrete abnormal areas of bowel wall thickening, pneumatosis or portal venous gas. 2. Cardiomegaly with small bilateral effusions with associated bibasilar opacities, left greater than likely atelectasis. 3. Mild nodularity hepatic contour as could be seen in the setting early cirrhotic change. 4. Stable asymmetric atrophy of the left kidney in comparison right without evidence of urinary obstruction. Electronically Signed   By: Sandi Mariscal M.D.   On: 08/27/2018 08:42    Microbiology: No results found for this or any previous visit (from the past 240 hour(s)).   Labs: Basic Metabolic Panel: Recent Labs  Lab 09/04/18 1234 09/07/18 2301  NA 135 131*  K 4.1 4.0  CL 94* 97*  CO2 28 27  GLUCOSE 119 103*  BUN 16 13  CREATININE 1.89* 1.63*  CALCIUM 9.7 9.2   Liver Function Tests: Recent Labs  Lab 09/04/18 1234  AST 25  ALT 11  BILITOT 0.6  PROT 6.8   No results for input(s): LIPASE, AMYLASE in the last 168 hours. No results for input(s): AMMONIA in the last 168 hours. CBC: Recent Labs  Lab 09/04/18 1234 09/07/18 2301  WBC 8.2 7.6  NEUTROABS 5,551  --   HGB 11.0* 11.0*  HCT 35.3 35.5*  MCV 78.6* 78.9*  PLT 344 247   Cardiac Enzymes: Recent Labs  Lab 09/08/18 0236 09/08/18 0553  TROPONINI <0.03 <0.03   BNP: BNP (last 3 results) No results for input(s): BNP in the last 8760 hours.  ProBNP (last 3 results) No results for input(s): PROBNP in the last 8760 hours.  CBG: Recent Labs  Lab 09/08/18 2013 09/09/18 0033 09/09/18 0409  09/09/18 0747 09/09/18 1152  GLUCAP 176* 88 82 138* 116*       Signed:  Radene Gunning NP  [facility] 09/09/2018, 3:39 PM

## 2018-09-11 DIAGNOSIS — N183 Chronic kidney disease, stage 3 (moderate): Secondary | ICD-10-CM | POA: Diagnosis not present

## 2018-09-11 DIAGNOSIS — E1122 Type 2 diabetes mellitus with diabetic chronic kidney disease: Secondary | ICD-10-CM | POA: Diagnosis not present

## 2018-09-11 DIAGNOSIS — I110 Hypertensive heart disease with heart failure: Secondary | ICD-10-CM | POA: Diagnosis not present

## 2018-09-11 DIAGNOSIS — I951 Orthostatic hypotension: Secondary | ICD-10-CM | POA: Diagnosis not present

## 2018-09-11 DIAGNOSIS — I150 Renovascular hypertension: Secondary | ICD-10-CM | POA: Diagnosis not present

## 2018-09-11 DIAGNOSIS — I2510 Atherosclerotic heart disease of native coronary artery without angina pectoris: Secondary | ICD-10-CM | POA: Diagnosis not present

## 2018-09-12 DIAGNOSIS — N183 Chronic kidney disease, stage 3 (moderate): Secondary | ICD-10-CM | POA: Diagnosis not present

## 2018-09-12 DIAGNOSIS — I951 Orthostatic hypotension: Secondary | ICD-10-CM | POA: Diagnosis not present

## 2018-09-12 DIAGNOSIS — I2510 Atherosclerotic heart disease of native coronary artery without angina pectoris: Secondary | ICD-10-CM | POA: Diagnosis not present

## 2018-09-12 DIAGNOSIS — E1122 Type 2 diabetes mellitus with diabetic chronic kidney disease: Secondary | ICD-10-CM | POA: Diagnosis not present

## 2018-09-12 DIAGNOSIS — I110 Hypertensive heart disease with heart failure: Secondary | ICD-10-CM | POA: Diagnosis not present

## 2018-09-12 DIAGNOSIS — I150 Renovascular hypertension: Secondary | ICD-10-CM | POA: Diagnosis not present

## 2018-09-15 ENCOUNTER — Encounter: Payer: Self-pay | Admitting: Internal Medicine

## 2018-09-15 ENCOUNTER — Ambulatory Visit (INDEPENDENT_AMBULATORY_CARE_PROVIDER_SITE_OTHER): Payer: Medicare Other | Admitting: Internal Medicine

## 2018-09-15 VITALS — BP 120/60 | HR 72 | Temp 97.6°F | Ht 62.0 in | Wt 133.0 lb

## 2018-09-15 DIAGNOSIS — I701 Atherosclerosis of renal artery: Secondary | ICD-10-CM | POA: Diagnosis not present

## 2018-09-15 DIAGNOSIS — S29011A Strain of muscle and tendon of front wall of thorax, initial encounter: Secondary | ICD-10-CM

## 2018-09-15 DIAGNOSIS — R110 Nausea: Secondary | ICD-10-CM

## 2018-09-15 DIAGNOSIS — R0789 Other chest pain: Secondary | ICD-10-CM

## 2018-09-15 DIAGNOSIS — R0989 Other specified symptoms and signs involving the circulatory and respiratory systems: Secondary | ICD-10-CM | POA: Diagnosis not present

## 2018-09-15 NOTE — Patient Instructions (Addendum)
Recommend heat OR topicals, massage to left scapula/shoulder blade area.  Costochondritis Costochondritis is swelling and irritation (inflammation) of the tissue (cartilage) that connects your ribs to your breastbone (sternum). This causes pain in the front of your chest. The pain usually starts gradually and involves more than one rib. What are the causes? The exact cause of this condition is not always known. It results from stress on the cartilage where your ribs attach to your sternum. The cause of this stress could be:  Chest injury (trauma).  Exercise or activity, such as lifting.  Severe coughing.  What increases the risk? You may be at higher risk for this condition if you:  Are female.  Are 61?79 years old.  Recently started a new exercise or work activity.  Have low levels of vitamin D.  Have a condition that makes you cough frequently.  What are the signs or symptoms? The main symptom of this condition is chest pain. The pain:  Usually starts gradually and can be sharp or dull.  Gets worse with deep breathing, coughing, or exercise.  Gets better with rest.  May be worse when you press on the sternum-rib connection (tenderness).  How is this diagnosed? This condition is diagnosed based on your symptoms, medical history, and a physical exam. Your health care provider will check for tenderness when pressing on your sternum. This is the most important finding. You may also have tests to rule out other causes of chest pain. These may include:  A chest X-ray to check for lung problems.  An electrocardiogram (ECG) to see if you have a heart problem that could be causing the pain.  An imaging scan to rule out a chest or rib fracture.  How is this treated? This condition usually goes away on its own over time. Your health care provider may prescribe an NSAID to reduce pain and inflammation. Your health care provider may also suggest that you:  Rest and avoid  activities that make pain worse.  Apply heat or cold to the area to reduce pain and inflammation.  Do exercises to stretch your chest muscles.  If these treatments do not help, your health care provider may inject a numbing medicine at the sternum-rib connection to help relieve the pain. Follow these instructions at home:  Avoid activities that make pain worse. This includes any activities that use chest, abdominal, and side muscles.  If directed, put ice on the painful area: ? Put ice in a plastic bag. ? Place a towel between your skin and the bag. ? Leave the ice on for 20 minutes, 2-3 times a day.  If directed, apply heat to the affected area as often as told by your health care provider. Use the heat source that your health care provider recommends, such as a moist heat pack or a heating pad. ? Place a towel between your skin and the heat source. ? Leave the heat on for 20-30 minutes. ? Remove the heat if your skin turns bright red. This is especially important if you are unable to feel pain, heat, or cold. You may have a greater risk of getting burned.  Take over-the-counter and prescription medicines only as told by your health care provider.  Return to your normal activities as told by your health care provider. Ask your health care provider what activities are safe for you.  Keep all follow-up visits as told by your health care provider. This is important. Contact a health care provider if:  You  have chills or a fever.  Your pain does not go away or it gets worse.  You have a cough that does not go away (is persistent). Get help right away if:  You have shortness of breath. This information is not intended to replace advice given to you by your health care provider. Make sure you discuss any questions you have with your health care provider. Document Released: 08/01/2005 Document Revised: 05/11/2016 Document Reviewed: 02/15/2016 Elsevier Interactive Patient Education   [employer].

## 2018-09-15 NOTE — Progress Notes (Signed)
Location:  [facility] clinic Provider: Lashon Hillier L. Mariea Clonts, D.O., C.M.D.  Goals of Care:  Advanced Directives 09/08/2018  Does Patient Have a Medical Advance Directive? Yes  Type of Advance Directive Living will  Does patient want to make changes to medical advance directive? No - Patient declined  Copy of [place] in Chart? -  Would patient like information on creating a medical advance directive? -  Pre-existing out of facility DNR order (yellow form or pink MOST form) -   Chief Complaint  Patient presents with  . Hospitalization Follow-up    DISCHARGED 09/09/18    HPI: Patient is a 77 y.o. female with h/o CAD w/p stents and 2014 CABG, hyperlipidemia, RAS s/p stents in sept, DMII, paroxysmal afib, stroke, thoracic aneurysm seen today for hospital follow-up s/p admission from 11/3-11/5/19 for chest pain.   Troponin was negative, BP 536-644 systolic in ED.  She go some relief from morphine and GI cocktail.  CT head showed incidental meningioma.  BP had gone way high.  Lexiscan myoview was negative for ischemia and low risk.  She had some loose stools that then resolved and she was discharged home.  She is back on a low dose of coreg.    Here today, she is nauseous and vomiting in the office.  She was on bentyl for stomach spasms.    She's had costochondritis and had been on an antiinflammatory.  Spasms just started the last couple months and now much worse.  She previously had a prednisone taper that helped before.  Discussed topicals instead given recent upset stomach issues.  Stomach had been fine until today when her pain in her left shoulder.  Right neck and hand bothersome and throbbing today.  Bowels are moving now.    Weight down 3 more lbs.  Is grazing more on crackers, fruit, supplement.  Loves cabbage.  Likes nutty buddies.    Working with PT at home and walking with a walker at home, cane here.    Past Medical History:  Diagnosis Date  . Abdominal pain, epigastric    . Acute upper respiratory infections of unspecified site   . Arthritis   . Benign paroxysmal positional vertigo   . CAD (coronary artery disease)    prior coronary stenting  . Candidiasis of vulva and vagina   . Carotid artery disease ([place]) 2001   s/p Bilateral CEA, after CVA  . Cervicalgia   . Decreased pedal pulses 06/04/2011   doppler - bilateral ABIs no evidence of insufficiency; L CIA slightly elevated velocities 20-30% diameter reduction;   . Dyspnea 03/13/2006   Myoview - EF 76%; mild ischemia in apical lateral region, less severe than previous study in 2002  . Edema   . H/O carotid endarterectomy 03/06/2012   CT angio -high grade stenosis of proximal R internal carotid w/ lg posterior ulceration or dissection flap; near occlusive stenosis at origin of nondominantproximal L vertebral artery; <50% stenosis of proximal R vertebral and proximal L subclavian artery  . H/O endarterectomy 09/11/2012   patent L and R carotid sites, R 60-79% restenosed ICA; L <40% restenosed ICA  . H/O endarterectomy 01/08/2012   doppler - R distal common carotid/proximal ICA stenosed 80-99%, L 0-39%  . HTN (hypertension) 11/27/2012  . Hyperlipidemia   . Limb pain 05/02/2011   doppler - no evidence of thrombus of thrombophlebitis  . Long term (current) use of anticoagulants   . Loss of weight   . Lumbago   .  Myalgia and myositis, unspecified   . Myocardial infarction ([place])   . Nonspecific abnormal results of liver function study   . Nontoxic uninodular goiter   . Osteoarthrosis, unspecified whether generalized or localized, unspecified site   . Other malaise and fatigue   . Other manifestations of vitamin A deficiency   . PAF (paroxysmal atrial fibrillation) ([place]) 02/21/2011  . Pain in joint, pelvic region and thigh   . Pain in limb   . Peripheral vascular disease ([place])   . Phlebitis and thrombophlebitis of superficial vessels of lower extremities   . Primary localized osteoarthrosis, hand   .  Pulmonary HTN ([place]) 12/03/2012  . Renal artery stenosis ([place])   . Routine gynecological examination   . Spinal stenosis, unspecified region other than cervical   . Stenosis of artery ([place]) 06/03/2012   doppler- most distal aspect of abd aorta no aneurysmal dilation; bilateral ABIs - mild L arterial insufficiency, L CIA narrowing w/ 50-69% diameter reduction; L and R SFA both w/ 0-49% diameter reductions  . Stroke ([place]) 2001  . Superficial vein thrombosis 05/11/2015   Left forehead  . Tobacco use disorder   . TR (tricuspid regurgitation), severe 12/02/12 for repair with CABG 12/03/2012  . Type II or unspecified type diabetes mellitus with peripheral circulatory disorders, not stated as uncontrolled(250.70)   . Unspecified cataract   . Unspecified disorder of iris and ciliary body   . Unspecified vitamin D deficiency   . Urinary tract infection, site not specified     Past Surgical History:  Procedure Laterality Date  . ABDOMINAL HYSTERECTOMY  1975  . ANGIOPLASTY  1984 and 1982  . APPENDECTOMY  1954  . ARCH AORTOGRAM  03/30/2011   40% recurrent stenosis at origin of R proximal carotid patch, shelf like recurrent stenosis w/in midsection of patch that does not appear to be flow limiting; normal non-stenosed great vessel origins  . BACK SURGERY  '84,'86, '87   three previous surgeries  . CAROTID ENDARTERECTOMY  2001   Bilateral  . CORONARY ARTERY BYPASS GRAFT  12/04/2012   Procedure: CORONARY ARTERY BYPASS GRAFTING (CABG);  Surgeon: Ivin Poot, MD; LIMA-LAD, SVG-OM, SVG-RCA  . ESOPHAGOGASTRODUODENOSCOPY (EGD) WITH PROPOFOL N/A 08/27/2018   Procedure: ESOPHAGOGASTRODUODENOSCOPY (EGD) WITH PROPOFOL;  Surgeon: Mauri Pole, MD;  Location: [facility];  Service: Endoscopy;  Laterality: N/A;  . INTRAOPERATIVE TRANSESOPHAGEAL ECHOCARDIOGRAM  12/04/2012   Procedure: INTRAOPERATIVE TRANSESOPHAGEAL ECHOCARDIOGRAM;  Surgeon: Ivin Poot, MD;  Location: [place];  Service: Open Heart Surgery;   Laterality: N/A;  . KIDNEY STONE SURGERY     Removal  . LEFT HEART CATHETERIZATION WITH CORONARY ANGIOGRAM N/A 11/28/2012   Procedure: LEFT HEART CATHETERIZATION WITH CORONARY ANGIOGRAM;  Surgeon: Leonie Man, MD;  Location: [facility] CATH LAB;  Service: Cardiovascular;  Laterality: N/A;  . PERCUTANEOUS CORONARY STENT INTERVENTION (PCI-S)  2002   2 BMS in ostial/Prox & mid RCA (mid 2.5 mm 20x  mm, & 2.75 mm x  8 mm ostial)  . PERIPHERAL VASCULAR INTERVENTION  07/21/2018   Procedure: PERIPHERAL VASCULAR INTERVENTION;  Surgeon: Lorretta Harp, MD;  Location: [facility] CV LAB;  Service: Cardiovascular;;  bilateral renal stents  . RENAL ANGIOGRAM N/A 11/30/2013   Procedure: RENAL ANGIOGRAM;  Surgeon: Lorretta Harp, MD;  Location[facility] C[facility] LAB;  Service: Cardiovascular;  Laterality: N/A;  . RENAL ANGIOGRAPHY N/A 07/21/2018   Procedure: RENAL ANGIOGRAPHY;  Surgeon: Lorretta Harp, MD;  Location: [place] CV LAB;  Service: Cardiovascular;  Laterality:  N/A;  . RIGHT HEART CATHETERIZATION N/A 12/02/2012   Procedure: RIGHT HEART CATH;  Surgeon: Troy Sine, MD;  Location: [facility] CATH LAB;  Service: Cardiovascular;  Laterality: N/A;  . TEE WITH CARDIOVERSION  01/23/2011   EF 93-81%; grade 2 diastolic dysfunction, elevated mean LA filling pressure, RV systolic pressure increased consistent w/ mod pulmonary hypertension    Allergies  Allergen Reactions  . Iron Hives  . Vicodin [Hydrocodone-Acetaminophen] Other (See Comments)    Caused arrythmia   . Amiodarone Nausea And Vomiting  . Norvasc [Amlodipine Besylate] Nausea And Vomiting  . Promethazine Other (See Comments)    hallucinations  . Vioxx [Rofecoxib] Nausea And Vomiting    Outpatient Encounter Medications as of 09/15/2018  Medication Sig  . alendronate (FOSAMAX) 70 MG tablet Take 70 mg by mouth every Friday.   . bimatoprost (LUMIGAN) 0.01 % SOLN Place 1 drop into both eyes at bedtime.  . carvedilol (COREG) 12.5 MG tablet Take 1 tablet  (12.5 mg total) by mouth 2 (two) times daily with a meal.  . clopidogrel (PLAVIX) 75 MG tablet Take 1 tablet (75 mg total) by mouth daily with breakfast.  . dicyclomine (BENTYL) 10 MG capsule Take 3 times daily as needed for spasm.  . ezetimibe (ZETIA) 10 MG tablet Take 10 mg by mouth at bedtime.   [name] glucose blood (ONETOUCH VERIO) test strip Use as instructed to test blood sugar twice daily DX: E11.29  . metFORMIN (GLUCOPHAGE) 1000 MG tablet Take 1,000 mg by mouth 2 (two) times daily with a meal.  . nitroGLYCERIN (NITROSTAT) 0.4 MG SL tablet Place 1 tablet (0.4 mg total) under the tongue every 5 (five) minutes as needed for chest pain.  [name] omeprazole (PRILOSEC) 40 MG capsule Take 1 capsule (40 mg total) by mouth 2 (two) times daily.  . ondansetron (ZOFRAN ODT) 8 MG disintegrating tablet Take 1 tablet (8 mg total) by mouth every 8 (eight) hours as needed for nausea or vomiting.  . rosuvastatin (CRESTOR) 40 MG tablet Take one tablet by mouth once daily for cholesterol  . [DISCONTINUED] metFORMIN (GLUCOPHAGE) 1000 MG tablet TAKE 1 TABLET TWICE A DAY WITH MEALS  . [DISCONTINUED] fluconazole (DIFLUCAN) 100 MG tablet Take 1 tablet (100 mg total) by mouth daily.  . [DISCONTINUED] polyethylene glycol (MIRALAX) packet Take 17 g by mouth 2 (two) times daily.   No facility-administered encounter medications on file as of 09/15/2018.     Review of Systems:  Review of Systems  Constitutional: Positive for malaise/fatigue and weight loss. Negative for chills and fever.  Respiratory: Negative for cough and shortness of breath.   Cardiovascular: Negative for palpitations and leg swelling.       No chest pain since  Gastrointestinal: Positive for nausea and vomiting. Negative for abdominal pain, blood in stool, constipation, diarrhea, heartburn and melena.  Genitourinary: Negative for dysuria.  Musculoskeletal: Positive for myalgias and neck pain. Negative for falls.       Around left scapula  Skin:  Negative for itching and rash.  Neurological: Negative for dizziness and loss of consciousness.  Psychiatric/Behavioral: Negative for memory loss. The patient is not nervous/anxious and does not have insomnia.     Health Maintenance  Topic Date Due  . URINE MICROALBUMIN  04/15/2018  . OPHTHALMOLOGY EXAM  10/03/2018  . FOOT EXAM  01/24/2019  . HEMOGLOBIN A1C  03/05/2019  . TETANUS/TDAP  11/13/2021  . DEXA SCAN  Completed  . PNA vac Low Risk Adult  Completed  . INFLUENZA  VACCINE  Discontinued    Physical Exam: Vitals:   09/15/18 1531  BP: 120/60  Pulse: 72  Temp: 97.6 F (36.4 C)  TempSrc: Oral  SpO2: 97%  Weight: 133 lb (60.3 kg)  Height: 5\' 2"  (1.575 m)   Body mass index is 24.33 kg/m. Physical Exam  Constitutional: She is oriented to person, place, and time. She appears distressed.  HENT:  Head: Normocephalic and atraumatic.  Cardiovascular: Normal rate, regular rhythm, normal heart sounds and intact distal pulses.  Pulmonary/Chest: Effort normal and breath sounds normal. No respiratory distress.  Abdominal: Soft. Bowel sounds are normal.  Vomiting upon arrival  Musculoskeletal: She exhibits tenderness.  Over left scapula region; walking with cane  Neurological: She is alert and oriented to person, place, and time.  Skin: Skin is warm and dry.  Color back in her face  Psychiatric: She has a normal mood and affect.    Labs reviewed: Basic Metabolic Panel: Recent Labs    07/15/18 1217  08/28/18 0343 09/04/18 1234 09/07/18 2301  NA 142   < > 133* 135 131*  K 4.8   < > 3.8 4.1 4.0  CL 103   < > 105 94* 97*  CO2 22   < > 25 28 27   GLUCOSE 81   < > 104* 119 103*  BUN 23   < > 8 16 13   CREATININE 1.52*   < > 1.49* 1.89* 1.63*  CALCIUM 9.2   < > 8.3* 9.7 9.2  TSH 2.330  --   --   --   --    < > = values in this interval not displayed.   Liver Function Tests: Recent Labs    06/03/18 0822 08/22/18 2319 09/04/18 1234  AST 14 50* 25  ALT 6 28 11   ALKPHOS   --  60  --   BILITOT 0.7 0.8 0.6  PROT 6.1 6.5 6.8  ALBUMIN  --  2.7*  --    Recent Labs    08/22/18 2319  LIPASE 35   No results for input(s): AMMONIA in the last 8760 hours. CBC: Recent Labs    08/28/18 0343 08/29/18 0511 09/04/18 1234 09/07/18 2301  WBC 4.6 4.4 8.2 7.6  NEUTROABS 2.7 2.6 5,551  --   HGB 7.8* 8.0* 11.0* 11.0*  HCT 25.3* 25.6* 35.3 35.5*  MCV 75.7* 75.7* 78.6* 78.9*  PLT 220 242 344 247   Lipid Panel: Recent Labs    12/24/17 0824 06/03/18 0822  CHOL 171 147  HDL 51 45*  LDLCALC 103* 86  TRIG 81 69  CHOLHDL 3.4 3.3   Lab Results  Component Value Date   HGBA1C 6.1 (H) 09/04/2018    Procedures since last visit: Dg Chest 2 View  Result Date: 09/07/2018 CLINICAL DATA:  Left chest pain since 1700 hours. Radiates to left shoulder. Shortness of breath. History of blood clots and cardiac history. EXAM: CHEST - 2 VIEW COMPARISON:  08/26/2018 FINDINGS: Postoperative changes in the mediastinum. Mild cardiac enlargement. No vascular congestion, edema, or consolidation in the lungs. No blunting of costophrenic angles. Tortuous and dilated aorta. IMPRESSION: Mild cardiac enlargement. No evidence of active pulmonary disease. Tortuous and dilated aorta. Electronically Signed   By: Lucienne Capers M.D.   On: 09/07/2018 23:39   Ct Head Wo Contrast  Result Date: 09/08/2018 CLINICAL DATA:  Generalized weakness. Headache. EXAM: CT HEAD WITHOUT CONTRAST TECHNIQUE: Contiguous axial images were obtained from the base of the skull through the vertex without intravenous  contrast. COMPARISON:  Neck CTA 03/06/2012. FINDINGS: Brain: There is no evidence of acute infarct, intracranial hemorrhage, midline shift, or extra-axial fluid collection. A chronic lacunar infarct is noted in the right caudate nucleus. There is also a small chronic posterior right frontal cortical infarct. Cerebral white matter hypodensities are nonspecific but compatible with mild chronic small vessel  ischemic disease. There is mild cerebral atrophy. An 8 mm extra-axial mass is noted in the right frontoparietal region without mass effect on the adjacent brain. Vascular: Calcified atherosclerosis at the skull base. No hyperdense vessel. Partially visualized right ICA atherosclerosis and mild dilatation in the neck with history of endarterectomy and dissects in flat shown on the prior CTA. Skull: No fracture or focal osseous lesion. Sinuses/Orbits: Paranasal sinuses and mastoid air cells are clear. Right cataract extraction. Other: None. IMPRESSION: 1. No evidence of acute intracranial abnormality. 2. Mild chronic small vessel ischemic disease and chronic infarcts as above. 3. 8 mm right frontoparietal extra-axial mass, likely an incidental meningioma. Electronically Signed   By: Logan Bores M.D.   On: 09/08/2018 00:51   Mr Brain Wo Contrast  Result Date: 09/08/2018 CLINICAL DATA:  Right arm weakness over the last week. EXAM: MRI HEAD WITHOUT CONTRAST TECHNIQUE: Multiplanar, multiecho pulse sequences of the brain and surrounding structures were obtained without intravenous contrast. COMPARISON:  CT same day FINDINGS: Brain: Diffusion imaging does not show any acute or subacute infarction. There chronic small-vessel ischemic changes of the pons. There are a few old small vessel cerebellar infarctions. Cerebral hemispheres show mild chronic small-vessel ischemic change of the deep and subcortical white matter. There is an old right posterior parietal cortical and subcortical infarction. No evidence of intra-axial mass lesion, hemorrhage, hydrocephalus or extra-axial collection. There is a 7 mm meningioma at the right parietal vertex without any mass-effect upon the brain. Vascular: Flow is present within the major vessels at the base of the brain, with exception of the distal left vertebral artery, which does not show demonstrable flow. This vessel is probably occluded after PICA. Skull and upper cervical spine:  Negative Sinuses/Orbits: Clear/normal Other: None IMPRESSION: No acute or subacute insult. Chronic small-vessel ischemic changes of the pons, cerebellum and cerebral hemispheric white matter. Old right parietal cortical and subcortical infarction, small. 7 mm meningioma at the right frontoparietal vertex, not likely of any clinical significance. Apparent absent flow within the distal left vertebral artery, which is probably occluded after PICA. Right vertebral artery widely patent to the basilar. Electronically Signed   By: Nelson Chimes M.D.   On: 09/08/2018 12:00   Nm Myocar Multi W/spect W/wall Motion / Ef  Result Date: 09/08/2018 CLINICAL DATA:  77 year old female with chest pain. History of myocardial infarction, atrial fibrillation, previous CABG, smoking history, diabetes and hypertension. EXAM: MYOCARDIAL IMAGING WITH SPECT (REST AND PHARMACOLOGIC-STRESS) GATED LEFT VENTRICULAR WALL MOTION STUDY LEFT VENTRICULAR EJECTION FRACTION TECHNIQUE: Standard myocardial SPECT imaging was performed after resting intravenous injection of 10 mCi Tc-88m tetrofosmin. Subsequently, intravenous infusion of Lexiscan was performed under the supervision of the Cardiology staff. At peak effect of the drug, 30 mCi Tc-36m tetrofosmin was injected intravenously and standard myocardial SPECT imaging was performed. Quantitative gated imaging was also performed to evaluate left ventricular wall motion, and estimate left ventricular ejection fraction. COMPARISON:  None. FINDINGS: Perfusion: No decreased activity in the left ventricle on stress imaging to suggest reversible ischemia or infarction. Wall Motion: Septal hypokinesis noted. Left Ventricular Ejection Fraction: 54 % End diastolic volume 71 ml End systolic volume 33  ml IMPRESSION: 1. No reversible ischemia or infarction. 2. Septal hypokinesis. 3. Left ventricular ejection fraction 54% 4. Non invasive risk stratification*: Low *2012 Appropriate Use Criteria for Coronary  Revascularization Focused Update: J Am Coll Cardiol. 9390;30(0):923-300. [email or web address] Electronically Signed   By: Margarette Canada M.D.   On: 09/08/2018 17:28    Assessment/Plan 1. Other chest pain -felt to be due to high bp at hospital and was put back on low dose coreg 12.5mg  po bid  2. Intercostal muscle strain, initial encounter -recommended heat, may use topical rubs (but not together with heat for fear of burns), massage; if still not getting relief, may need prednisone after all (but I'm concerned it will worsen her GI upset after all she's been through lately)  3. Nausea -seems related to pain at this time -will treat pain  -needs to eat consistently.  4. Labile blood pressure -had very high bp last week when she went to the hospital, better now with low dose coreg, denies dizziness  Labs/tests ordered: No orders of the defined types were placed in this encounter.   Next appt:  10/16/2018--keep as scheduled  Malakai Schoenherr L. Tyray Proch, D.O. [facility] [address], [place] [address] Cell Phone (Mon-Fri 8am-5pm):  [phone] On Call:  [phone] & follow prompts after 5pm & weekends Office Phone:  [phone] Office Fax:  [phone]

## 2018-09-16 DIAGNOSIS — I951 Orthostatic hypotension: Secondary | ICD-10-CM | POA: Diagnosis not present

## 2018-09-16 DIAGNOSIS — N183 Chronic kidney disease, stage 3 (moderate): Secondary | ICD-10-CM | POA: Diagnosis not present

## 2018-09-16 DIAGNOSIS — I150 Renovascular hypertension: Secondary | ICD-10-CM | POA: Diagnosis not present

## 2018-09-16 DIAGNOSIS — I2510 Atherosclerotic heart disease of native coronary artery without angina pectoris: Secondary | ICD-10-CM | POA: Diagnosis not present

## 2018-09-16 DIAGNOSIS — E1122 Type 2 diabetes mellitus with diabetic chronic kidney disease: Secondary | ICD-10-CM | POA: Diagnosis not present

## 2018-09-16 DIAGNOSIS — I110 Hypertensive heart disease with heart failure: Secondary | ICD-10-CM | POA: Diagnosis not present

## 2018-09-17 ENCOUNTER — Telehealth: Payer: Self-pay | Admitting: *Deleted

## 2018-09-17 NOTE — Telephone Encounter (Signed)
Kendra Todd called and stated that they could not remember if Dr. Mariea Clonts recommended heat or cold for patient's muscle strain.  Informed that Dr. Mariea Clonts recommended heat and Kendra Todd [place] topical rubs (but not together with heat for fear of burns), massage.  They will call back by Friday if no better. Agreed.

## 2018-09-18 DIAGNOSIS — I110 Hypertensive heart disease with heart failure: Secondary | ICD-10-CM | POA: Diagnosis not present

## 2018-09-18 DIAGNOSIS — I951 Orthostatic hypotension: Secondary | ICD-10-CM | POA: Diagnosis not present

## 2018-09-18 DIAGNOSIS — E1122 Type 2 diabetes mellitus with diabetic chronic kidney disease: Secondary | ICD-10-CM | POA: Diagnosis not present

## 2018-09-18 DIAGNOSIS — I150 Renovascular hypertension: Secondary | ICD-10-CM | POA: Diagnosis not present

## 2018-09-18 DIAGNOSIS — I2510 Atherosclerotic heart disease of native coronary artery without angina pectoris: Secondary | ICD-10-CM | POA: Diagnosis not present

## 2018-09-18 DIAGNOSIS — N183 Chronic kidney disease, stage 3 (moderate): Secondary | ICD-10-CM | POA: Diagnosis not present

## 2018-09-19 ENCOUNTER — Telehealth: Payer: Self-pay

## 2018-09-19 NOTE — Telephone Encounter (Signed)
I would like therapy to work with her on her shoulder pain.  She's getting home health PT--can we call her home health agency and ask them to add on exercises and therapeutic modalities for her left shoulder?  They may be able to help with therapeutic modalities for her and exercises that may prevent this from recurring.  I'm hesitant to prescribe a muscle relaxant due to her risk of falling at this point (due to weakness from her two recent hospitalizations).

## 2018-09-19 NOTE — Telephone Encounter (Signed)
Received call form patient that  was seen on 09/15/2018 and complained of having left shoulder blade pain, Patient states she is still in pain when moving and has some swelling.   She has been alternating heat  as well as using aspercreame with  lidocaine and it has not been helping. Please advise on what you would like for patient to do at this point.

## 2018-09-22 DIAGNOSIS — I110 Hypertensive heart disease with heart failure: Secondary | ICD-10-CM | POA: Diagnosis not present

## 2018-09-22 DIAGNOSIS — I951 Orthostatic hypotension: Secondary | ICD-10-CM | POA: Diagnosis not present

## 2018-09-22 DIAGNOSIS — I150 Renovascular hypertension: Secondary | ICD-10-CM | POA: Diagnosis not present

## 2018-09-22 DIAGNOSIS — I2510 Atherosclerotic heart disease of native coronary artery without angina pectoris: Secondary | ICD-10-CM | POA: Diagnosis not present

## 2018-09-22 DIAGNOSIS — N183 Chronic kidney disease, stage 3 (moderate): Secondary | ICD-10-CM | POA: Diagnosis not present

## 2018-09-22 DIAGNOSIS — E1122 Type 2 diabetes mellitus with diabetic chronic kidney disease: Secondary | ICD-10-CM | POA: Diagnosis not present

## 2018-09-22 NOTE — Telephone Encounter (Signed)
[facility]. Left message with Justice Rocher for Kendra Todd with physical therapy to return my call to add orders.

## 2018-09-22 NOTE — Telephone Encounter (Signed)
Call was placed to patient she is aware of provider recommendations

## 2018-09-23 NOTE — Telephone Encounter (Signed)
Call placed to [facility] left message for her to call the office to go over orders Dr. Mariea Clonts would like to add

## 2018-09-24 DIAGNOSIS — I110 Hypertensive heart disease with heart failure: Secondary | ICD-10-CM | POA: Diagnosis not present

## 2018-09-24 DIAGNOSIS — I2510 Atherosclerotic heart disease of native coronary artery without angina pectoris: Secondary | ICD-10-CM | POA: Diagnosis not present

## 2018-09-24 DIAGNOSIS — N183 Chronic kidney disease, stage 3 (moderate): Secondary | ICD-10-CM | POA: Diagnosis not present

## 2018-09-24 DIAGNOSIS — I150 Renovascular hypertension: Secondary | ICD-10-CM | POA: Diagnosis not present

## 2018-09-24 DIAGNOSIS — I951 Orthostatic hypotension: Secondary | ICD-10-CM | POA: Diagnosis not present

## 2018-09-24 DIAGNOSIS — E1122 Type 2 diabetes mellitus with diabetic chronic kidney disease: Secondary | ICD-10-CM | POA: Diagnosis not present

## 2018-09-24 NOTE — Telephone Encounter (Signed)
Wells Guiles called and left message on Clinical intake stating that she received someones message but the phone cut out so she didn't know the name.  I called nurse back and [facility] regarding Dr. Cyndi Lennert message.

## 2018-09-29 DIAGNOSIS — N183 Chronic kidney disease, stage 3 (moderate): Secondary | ICD-10-CM | POA: Diagnosis not present

## 2018-09-29 DIAGNOSIS — I2510 Atherosclerotic heart disease of native coronary artery without angina pectoris: Secondary | ICD-10-CM | POA: Diagnosis not present

## 2018-09-29 DIAGNOSIS — I951 Orthostatic hypotension: Secondary | ICD-10-CM | POA: Diagnosis not present

## 2018-09-29 DIAGNOSIS — E1122 Type 2 diabetes mellitus with diabetic chronic kidney disease: Secondary | ICD-10-CM | POA: Diagnosis not present

## 2018-09-29 DIAGNOSIS — I110 Hypertensive heart disease with heart failure: Secondary | ICD-10-CM | POA: Diagnosis not present

## 2018-09-29 DIAGNOSIS — I150 Renovascular hypertension: Secondary | ICD-10-CM | POA: Diagnosis not present

## 2018-10-06 DIAGNOSIS — I150 Renovascular hypertension: Secondary | ICD-10-CM | POA: Diagnosis not present

## 2018-10-06 DIAGNOSIS — I951 Orthostatic hypotension: Secondary | ICD-10-CM | POA: Diagnosis not present

## 2018-10-06 DIAGNOSIS — I2510 Atherosclerotic heart disease of native coronary artery without angina pectoris: Secondary | ICD-10-CM | POA: Diagnosis not present

## 2018-10-06 DIAGNOSIS — I110 Hypertensive heart disease with heart failure: Secondary | ICD-10-CM | POA: Diagnosis not present

## 2018-10-06 DIAGNOSIS — E1122 Type 2 diabetes mellitus with diabetic chronic kidney disease: Secondary | ICD-10-CM | POA: Diagnosis not present

## 2018-10-06 DIAGNOSIS — N183 Chronic kidney disease, stage 3 (moderate): Secondary | ICD-10-CM | POA: Diagnosis not present

## 2018-10-13 DIAGNOSIS — I110 Hypertensive heart disease with heart failure: Secondary | ICD-10-CM | POA: Diagnosis not present

## 2018-10-13 DIAGNOSIS — E1122 Type 2 diabetes mellitus with diabetic chronic kidney disease: Secondary | ICD-10-CM | POA: Diagnosis not present

## 2018-10-13 DIAGNOSIS — I951 Orthostatic hypotension: Secondary | ICD-10-CM | POA: Diagnosis not present

## 2018-10-13 DIAGNOSIS — I150 Renovascular hypertension: Secondary | ICD-10-CM | POA: Diagnosis not present

## 2018-10-13 DIAGNOSIS — I2510 Atherosclerotic heart disease of native coronary artery without angina pectoris: Secondary | ICD-10-CM | POA: Diagnosis not present

## 2018-10-13 DIAGNOSIS — N183 Chronic kidney disease, stage 3 (moderate): Secondary | ICD-10-CM | POA: Diagnosis not present

## 2018-10-16 ENCOUNTER — Encounter: Payer: Self-pay | Admitting: Internal Medicine

## 2018-10-16 ENCOUNTER — Ambulatory Visit (INDEPENDENT_AMBULATORY_CARE_PROVIDER_SITE_OTHER): Payer: Medicare Other | Admitting: Internal Medicine

## 2018-10-16 VITALS — BP 128/70 | HR 64 | Temp 98.0°F | Ht 62.0 in | Wt 135.0 lb

## 2018-10-16 DIAGNOSIS — I701 Atherosclerosis of renal artery: Secondary | ICD-10-CM | POA: Diagnosis not present

## 2018-10-16 DIAGNOSIS — M810 Age-related osteoporosis without current pathological fracture: Secondary | ICD-10-CM

## 2018-10-16 DIAGNOSIS — I951 Orthostatic hypotension: Secondary | ICD-10-CM | POA: Diagnosis not present

## 2018-10-16 DIAGNOSIS — D509 Iron deficiency anemia, unspecified: Secondary | ICD-10-CM

## 2018-10-16 DIAGNOSIS — Z9889 Other specified postprocedural states: Secondary | ICD-10-CM

## 2018-10-16 DIAGNOSIS — E1122 Type 2 diabetes mellitus with diabetic chronic kidney disease: Secondary | ICD-10-CM | POA: Diagnosis not present

## 2018-10-16 DIAGNOSIS — N183 Chronic kidney disease, stage 3 unspecified: Secondary | ICD-10-CM

## 2018-10-16 NOTE — Progress Notes (Signed)
Location:  [facility] clinic Provider:  Jobani Sabado L. Mariea Clonts, D.O., C.M.D.  Goals of Care:  Advanced Directives 09/08/2018  Does Patient Have a Medical Advance Directive? Yes  Type of Advance Directive Living will  Does patient want to make changes to medical advance directive? No - Patient declined  Copy of [place] in Chart? -  Would patient like information on creating a medical advance directive? -  Pre-existing out of facility DNR order (yellow form or pink MOST form) -     Chief Complaint  Patient presents with  . Medical Management of Chronic Issues    6week follow-up    HPI: Patient is a 77 y.o. female seen today for medical management of chronic diseases.    She is doing much better.  Not having a lot of pain.  Once in a while she'll get sore over her shoulder blade where she had the costochondritis--two extra strength tylenols and stretching does the trick.  She's through with her PT.  She does not like going Christmas shopping.    BP is fabulous since her renal stenting.    Nausea resolved.  Eating better.  Staying down.  Thanksgiving was her first complete meal with all the types of food on it.  She loves veggies.  Has protein drink if skips meat.  She does like yogurt and eggs on toast.    Sees Dr. Loletha Grayer in January.    Talked about vitamin D supplement with her fosamax.    She is getting her strength back.  She has to rest once in a while.  She managed to shop with her daughter today and didn't need her cane.  Due for eye exam with Dr. Venetia Maxon.  Was sent to macular degeneration with Dr. Zadie Rhine.  Missed her last visit b/c she was in the hospital.  She was then to get her left cataract removed.  Needs new glasses.  She is not able to make a urine sample for protein today.  Past Medical History:  Diagnosis Date  . Abdominal pain, epigastric   . Acute upper respiratory infections of unspecified site   . Arthritis   . Benign paroxysmal positional vertigo     . CAD (coronary artery disease)    prior coronary stenting  . Candidiasis of vulva and vagina   . Carotid artery disease ([place]) 2001   s/p Bilateral CEA, after CVA  . Cervicalgia   . Decreased pedal pulses 06/04/2011   doppler - bilateral ABIs no evidence of insufficiency; L CIA slightly elevated velocities 20-30% diameter reduction;   . Dyspnea 03/13/2006   Myoview - EF 76%; mild ischemia in apical lateral region, less severe than previous study in 2002  . Edema   . H/O carotid endarterectomy 03/06/2012   CT angio -high grade stenosis of proximal R internal carotid w/ lg posterior ulceration or dissection flap; near occlusive stenosis at origin of nondominantproximal L vertebral artery; <50% stenosis of proximal R vertebral and proximal L subclavian artery  . H/O endarterectomy 09/11/2012   patent L and R carotid sites, R 60-79% restenosed ICA; L <40% restenosed ICA  . H/O endarterectomy 01/08/2012   doppler - R distal common carotid/proximal ICA stenosed 80-99%, L 0-39%  . HTN (hypertension) 11/27/2012  . Hyperlipidemia   . Limb pain 05/02/2011   doppler - no evidence of thrombus of thrombophlebitis  . Long term (current) use of anticoagulants   . Loss of weight   . Lumbago   . Myalgia  and myositis, unspecified   . Myocardial infarction ([place])   . Nonspecific abnormal results of liver function study   . Nontoxic uninodular goiter   . Osteoarthrosis, unspecified whether generalized or localized, unspecified site   . Other malaise and fatigue   . Other manifestations of vitamin A deficiency   . PAF (paroxysmal atrial fibrillation) ([place]) 02/21/2011  . Pain in joint, pelvic region and thigh   . Pain in limb   . Peripheral vascular disease ([place])   . Phlebitis and thrombophlebitis of superficial vessels of lower extremities   . Primary localized osteoarthrosis, hand   . Pulmonary HTN ([place]) 12/03/2012  . Renal artery stenosis ([place])   . Routine gynecological examination   . Spinal stenosis,  unspecified region other than cervical   . Stenosis of artery ([place]) 06/03/2012   doppler- most distal aspect of abd aorta no aneurysmal dilation; bilateral ABIs - mild L arterial insufficiency, L CIA narrowing w/ 50-69% diameter reduction; L and R SFA both w/ 0-49% diameter reductions  . Stroke ([place]) 2001  . Superficial vein thrombosis 05/11/2015   Left forehead  . Tobacco use disorder   . TR (tricuspid regurgitation), severe 12/02/12 for repair with CABG 12/03/2012  . Type II or unspecified type diabetes mellitus with peripheral circulatory disorders, not stated as uncontrolled(250.70)   . Unspecified cataract   . Unspecified disorder of iris and ciliary body   . Unspecified vitamin D deficiency   . Urinary tract infection, site not specified     Past Surgical History:  Procedure Laterality Date  . ABDOMINAL HYSTERECTOMY  1975  . ANGIOPLASTY  1984 and 1982  . APPENDECTOMY  1954  . ARCH AORTOGRAM  03/30/2011   40% recurrent stenosis at origin of R proximal carotid patch, shelf like recurrent stenosis w/in midsection of patch that does not appear to be flow limiting; normal non-stenosed great vessel origins  . BACK SURGERY  '84,'86, '87   three previous surgeries  . CAROTID ENDARTERECTOMY  2001   Bilateral  . CORONARY ARTERY BYPASS GRAFT  12/04/2012   Procedure: CORONARY ARTERY BYPASS GRAFTING (CABG);  Surgeon: Ivin Poot, MD; LIMA-LAD, SVG-OM, SVG-RCA  . ESOPHAGOGASTRODUODENOSCOPY (EGD) WITH PROPOFOL N/A 08/27/2018   Procedure: ESOPHAGOGASTRODUODENOSCOPY (EGD) WITH PROPOFOL;  Surgeon: Mauri Pole, MD;  Location: [facility];  Service: Endoscopy;  Laterality: N/A;  . INTRAOPERATIVE TRANSESOPHAGEAL ECHOCARDIOGRAM  12/04/2012   Procedure: INTRAOPERATIVE TRANSESOPHAGEAL ECHOCARDIOGRAM;  Surgeon: Ivin Poot, MD;  Location: [place];  Service: Open Heart Surgery;  Laterality: N/A;  . KIDNEY STONE SURGERY     Removal  . LEFT HEART CATHETERIZATION WITH CORONARY ANGIOGRAM N/A  11/28/2012   Procedure: LEFT HEART CATHETERIZATION WITH CORONARY ANGIOGRAM;  Surgeon: Leonie Man, MD;  [facility] C[facility] LAB;  Service: Cardiovascular;  Laterality: N/A;  . PERCUTANEOUS CORONARY STENT INTERVENTION (PCI-S)  2002   2 BMS in ostial/Prox & mid RCA (mid 2.5 mm 20x  mm, & 2.75 mm x  8 mm ostial)  . PERIPHERAL VASCULAR INTERVENTION  07/21/2018   Procedure: PERIPHERAL VASCULAR INTERVENTION;  Surgeon: Lorretta Harp, MD;  Location: [place] CV LAB;  Service: Cardiovascular;;  bilateral renal stents  . RENAL ANGIOGRAM N/A 11/30/2013   Procedure: RENAL ANGIOGRAM;  Surgeon: Lorretta Harp, MD;  Location: [facility] CATH LAB;  Service: Cardiovascular;  Laterality: N/A;  . RENAL ANGIOGRAPHY N/A 07/21/2018   Procedure: RENAL ANGIOGRAPHY;  Surgeon: Lorretta Harp, MD;  Location: [place] CV LAB;  Service: Cardiovascular;  Laterality: N/A;  .  RIGHT HEART CATHETERIZATION N/A 12/02/2012   Procedure: RIGHT HEART CATH;  Surgeon: Troy Sine, MD;  Location: [facility] CATH LAB;  Service: Cardiovascular;  Laterality: N/A;  . TEE WITH CARDIOVERSION  01/23/2011   EF 09-60%; grade 2 diastolic dysfunction, elevated mean LA filling pressure, RV systolic pressure increased consistent w/ mod pulmonary hypertension    Allergies  Allergen Reactions  . Iron Hives  . Vicodin [Hydrocodone-Acetaminophen] Other (See Comments)    Caused arrythmia   . Amiodarone Nausea And Vomiting  . Norvasc [Amlodipine Besylate] Nausea And Vomiting  . Promethazine Other (See Comments)    hallucinations  . Vioxx [Rofecoxib] Nausea And Vomiting    Outpatient Encounter Medications as of 10/16/2018  Medication Sig  . alendronate (FOSAMAX) 70 MG tablet Take 70 mg by mouth every Friday.   . bimatoprost (LUMIGAN) 0.01 % SOLN Place 1 drop into both eyes at bedtime.  . carvedilol (COREG) 12.5 MG tablet Take 1 tablet (12.5 mg total) by mouth 2 (two) times daily with a meal.  . clopidogrel (PLAVIX) 75 MG tablet Take 1 tablet (75  mg total) by mouth daily with breakfast.  . ezetimibe (ZETIA) 10 MG tablet Take 10 mg by mouth at bedtime.   [name] glucose blood (ONETOUCH VERIO) test strip Use as instructed to test blood sugar twice daily DX: E11.29  . metFORMIN (GLUCOPHAGE) 1000 MG tablet Take 1,000 mg by mouth 2 (two) times daily with a meal.  . nitroGLYCERIN (NITROSTAT) 0.4 MG SL tablet Place 1 tablet (0.4 mg total) under the tongue every 5 (five) minutes as needed for chest pain.  . rosuvastatin (CRESTOR) 40 MG tablet Take one tablet by mouth once daily for cholesterol  . [DISCONTINUED] dicyclomine (BENTYL) 10 MG capsule Take 3 times daily as needed for spasm.  . [DISCONTINUED] omeprazole (PRILOSEC) 40 MG capsule Take 1 capsule (40 mg total) by mouth 2 (two) times daily.  . [DISCONTINUED] ondansetron (ZOFRAN ODT) 8 MG disintegrating tablet Take 1 tablet (8 mg total) by mouth every 8 (eight) hours as needed for nausea or vomiting.   No facility-administered encounter medications on file as of 10/16/2018.     Review of Systems:  Review of Systems  Constitutional: Positive for malaise/fatigue. Negative for chills and fever.  HENT: Negative for congestion.   Eyes: Positive for blurred vision.       Due for new glasses  Respiratory: Negative for cough and shortness of breath.   Cardiovascular: Negative for chest pain, palpitations and leg swelling.  Gastrointestinal: Negative for abdominal pain, blood in stool, constipation, diarrhea, melena, nausea and vomiting.  Genitourinary: Negative for dysuria.  Musculoskeletal: Positive for myalgias.       Left posterior shoulder  Skin: Negative for itching and rash.  Neurological: Negative for dizziness, loss of consciousness and headaches.  Endo/Heme/Allergies: Bruises/bleeds easily.  Psychiatric/Behavioral: Negative for depression and memory loss. The patient is not nervous/anxious and does not have insomnia.     Health Maintenance  Topic Date Due  . URINE MICROALBUMIN   04/15/2018  . OPHTHALMOLOGY EXAM  10/03/2018  . FOOT EXAM  01/24/2019  . HEMOGLOBIN A1C  03/05/2019  . TETANUS/TDAP  11/13/2021  . DEXA SCAN  Completed  . PNA vac Low Risk Adult  Completed  . INFLUENZA VACCINE  Discontinued    Physical Exam: Vitals:   10/16/18 1501  BP: 128/70  Pulse: 64  Temp: 98 F (36.7 C)  TempSrc: Oral  SpO2: 97%  Weight: 135 lb (61.2 kg)  Height: 5\' 2"  (  1.575 m)   Body mass index is 24.69 kg/m. Physical Exam Constitutional:      General: She is not in acute distress.    Appearance: Normal appearance.     Comments: Color is back and she's wearing her make-up and perfectly matched clothing again  HENT:     Head: Normocephalic and atraumatic.  Cardiovascular:     Rate and Rhythm: Normal rate and regular rhythm.     Pulses: Normal pulses.     Heart sounds: Normal heart sounds.     Comments: today Pulmonary:     Effort: Pulmonary effort is normal.     Breath sounds: Normal breath sounds. No rales.  Abdominal:     General: Abdomen is flat. Bowel sounds are normal.     Palpations: Abdomen is soft.  Musculoskeletal: Normal range of motion.     Comments: No longer needing assistive device  Skin:    General: Skin is warm and dry.  Neurological:     General: No focal deficit present.     Mental Status: She is alert and oriented to person, place, and time.     Labs reviewed: Basic Metabolic Panel: Recent Labs    07/15/18 1217  08/28/18 0343 09/04/18 1234 09/07/18 2301  NA 142   < > 133* 135 131*  K 4.8   < > 3.8 4.1 4.0  CL 103   < > 105 94* 97*  CO2 22   < > 25 28 27   GLUCOSE 81   < > 104* 119 103*  BUN 23   < > 8 16 13   CREATININE 1.52*   < > 1.49* 1.89* 1.63*  CALCIUM 9.2   < > 8.3* 9.7 9.2  TSH 2.330  --   --   --   --    < > = values in this interval not displayed.   Liver Function Tests: Recent Labs    06/03/18 0822 08/22/18 2319 09/04/18 1234  AST 14 50* 25  ALT 6 28 11   ALKPHOS  --  60  --   BILITOT 0.7 0.8 0.6  PROT  6.1 6.5 6.8  ALBUMIN  --  2.7*  --    Recent Labs    08/22/18 2319  LIPASE 35   No results for input(s): AMMONIA in the last 8760 hours. CBC: Recent Labs    08/28/18 0343 08/29/18 0511 09/04/18 1234 09/07/18 2301  WBC 4.6 4.4 8.2 7.6  NEUTROABS 2.7 2.6 5,551  --   HGB 7.8* 8.0* 11.0* 11.0*  HCT 25.3* 25.6* 35.3 35.5*  MCV 75.7* 75.7* 78.6* 78.9*  PLT 220 242 344 247   Lipid Panel: Recent Labs    12/24/17 0824 06/03/18 0822  CHOL 171 147  HDL 51 45*  LDLCALC 103* 86  TRIG 81 69  CHOLHDL 3.4 3.3   Lab Results  Component Value Date   HGBA1C 6.1 (H) 09/04/2018    Procedures since last visit: No results found.  Assessment/Plan 1. Iron deficiency anemia, unspecified iron deficiency anemia type -- appears her hgb is better, she'll come next week for labs  -CBC with Differential/Platelet; Future  2. CKD (chronic kidney disease) stage 3, GFR 30-59 ml/min (HCC) -hopefully improved after stenting and recovery from acute illness -f/u labs next week - Basic metabolic panel; Future - Microalbumin / creatinine urine ratio; Future  3. Type 2 diabetes mellitus with stage 3 chronic kidney disease, without long-term current use of insulin (HCC) - has been well controlled, but has  not had urine protein tested this year- Microalbumin / creatinine urine ratio; Future  4. History of stent insertion of renal artery -with resolution of HTN  5. Orthostatic hypotension -was severe when intake poor and she'd had the stents but was still on bp meds -much better now that she's back to eating since Thanksgiving  6. Senile osteoporosis - does not want to take anything more--continues fosamax -now doing well again so I'd like her back on vitamin D esp if level is low so will check level for her b/c fosamax doesn't work well if D is low - VITAMIN D 25 Hydroxy (Vit-D Deficiency, Fractures); Future  Labs/tests ordered:   Orders Placed This Encounter  Procedures  . CBC with  Differential/Platelet    Standing Status:   Future    Standing Expiration Date:   10/17/2019  . Basic metabolic panel    Standing Status:   Future    Standing Expiration Date:   10/17/2019    Order Specific Question:   Has the patient fasted?    Answer:   Yes  . Microalbumin / creatinine urine ratio    Standing Status:   Future    Standing Expiration Date:   10/17/2019  . VITAMIN D 25 Hydroxy (Vit-D Deficiency, Fractures)    Standing Status:   Future    Standing Expiration Date:   10/17/2019    Next appt:  11/10/2018   Willie Loy L. Ash Mcelwain, D.O. [facility] [address], [place] [address] Cell Phone (Mon-Fri 8am-5pm):  [phone] On Call:  [phone] & follow prompts after 5pm & weekends Office Phone:  [phone] Office Fax:  [phone]

## 2018-10-20 ENCOUNTER — Other Ambulatory Visit: Payer: Medicare Other

## 2018-10-20 DIAGNOSIS — E1122 Type 2 diabetes mellitus with diabetic chronic kidney disease: Secondary | ICD-10-CM | POA: Diagnosis not present

## 2018-10-20 DIAGNOSIS — N183 Chronic kidney disease, stage 3 unspecified: Secondary | ICD-10-CM

## 2018-10-20 DIAGNOSIS — D509 Iron deficiency anemia, unspecified: Secondary | ICD-10-CM

## 2018-10-20 DIAGNOSIS — M810 Age-related osteoporosis without current pathological fracture: Secondary | ICD-10-CM | POA: Diagnosis not present

## 2018-10-21 DIAGNOSIS — I110 Hypertensive heart disease with heart failure: Secondary | ICD-10-CM | POA: Diagnosis not present

## 2018-10-21 DIAGNOSIS — E1122 Type 2 diabetes mellitus with diabetic chronic kidney disease: Secondary | ICD-10-CM | POA: Diagnosis not present

## 2018-10-21 DIAGNOSIS — I951 Orthostatic hypotension: Secondary | ICD-10-CM | POA: Diagnosis not present

## 2018-10-21 DIAGNOSIS — I2510 Atherosclerotic heart disease of native coronary artery without angina pectoris: Secondary | ICD-10-CM | POA: Diagnosis not present

## 2018-10-21 DIAGNOSIS — N183 Chronic kidney disease, stage 3 (moderate): Secondary | ICD-10-CM | POA: Diagnosis not present

## 2018-10-21 DIAGNOSIS — I150 Renovascular hypertension: Secondary | ICD-10-CM | POA: Diagnosis not present

## 2018-10-21 LAB — CBC WITH DIFFERENTIAL/PLATELET
Absolute Monocytes: 529 cells/uL (ref 200–950)
Basophils Absolute: 42 cells/uL (ref 0–200)
Basophils Relative: 1 %
Eosinophils Absolute: 151 cells/uL (ref 15–500)
Eosinophils Relative: 3.6 %
HCT: 33.4 % — ABNORMAL LOW (ref 35.0–45.0)
Hemoglobin: 10.7 g/dL — ABNORMAL LOW (ref 11.7–15.5)
Lymphs Abs: 958 cells/uL (ref 850–3900)
MCH: 26.8 pg — ABNORMAL LOW (ref 27.0–33.0)
MCHC: 32 g/dL (ref 32.0–36.0)
MCV: 83.7 fL (ref 80.0–100.0)
MPV: 10.6 fL (ref 7.5–12.5)
Monocytes Relative: 12.6 %
Neutro Abs: 2520 cells/uL (ref 1500–7800)
Neutrophils Relative %: 60 %
Platelets: 163 10*3/uL (ref 140–400)
RBC: 3.99 10*6/uL (ref 3.80–5.10)
RDW: 20.9 % — ABNORMAL HIGH (ref 11.0–15.0)
Total Lymphocyte: 22.8 %
WBC: 4.2 10*3/uL (ref 3.8–10.8)

## 2018-10-21 LAB — BASIC METABOLIC PANEL
BUN/Creatinine Ratio: 23 (calc) — ABNORMAL HIGH (ref 6–22)
BUN: 27 mg/dL — ABNORMAL HIGH (ref 7–25)
CO2: 25 mmol/L (ref 20–32)
Calcium: 9.7 mg/dL (ref 8.6–10.4)
Chloride: 105 mmol/L (ref 98–110)
Creat: 1.15 mg/dL — ABNORMAL HIGH (ref 0.60–0.93)
Glucose, Bld: 110 mg/dL (ref 65–139)
Potassium: 4.1 mmol/L (ref 3.5–5.3)
Sodium: 139 mmol/L (ref 135–146)

## 2018-10-21 LAB — MICROALBUMIN / CREATININE URINE RATIO
Creatinine, Urine: 143 mg/dL (ref 20–275)
Microalb Creat Ratio: 58 mcg/mg creat — ABNORMAL HIGH (ref <30)
Microalb, Ur: 8.3 mg/dL

## 2018-10-21 LAB — VITAMIN D 25 HYDROXY (VIT D DEFICIENCY, FRACTURES): Vit D, 25-Hydroxy: 20 ng/mL — ABNORMAL LOW (ref 30–100)

## 2018-11-06 DIAGNOSIS — H2512 Age-related nuclear cataract, left eye: Secondary | ICD-10-CM | POA: Diagnosis not present

## 2018-11-06 DIAGNOSIS — H401131 Primary open-angle glaucoma, bilateral, mild stage: Secondary | ICD-10-CM | POA: Diagnosis not present

## 2018-11-10 ENCOUNTER — Other Ambulatory Visit: Payer: Medicare Other

## 2018-11-12 DIAGNOSIS — M25561 Pain in right knee: Secondary | ICD-10-CM | POA: Diagnosis not present

## 2018-11-12 DIAGNOSIS — M810 Age-related osteoporosis without current pathological fracture: Secondary | ICD-10-CM | POA: Diagnosis not present

## 2018-11-12 DIAGNOSIS — M25519 Pain in unspecified shoulder: Secondary | ICD-10-CM | POA: Diagnosis not present

## 2018-11-12 DIAGNOSIS — M79671 Pain in right foot: Secondary | ICD-10-CM | POA: Diagnosis not present

## 2018-11-12 DIAGNOSIS — R202 Paresthesia of skin: Secondary | ICD-10-CM | POA: Diagnosis not present

## 2018-11-12 DIAGNOSIS — N189 Chronic kidney disease, unspecified: Secondary | ICD-10-CM | POA: Diagnosis not present

## 2018-11-12 DIAGNOSIS — M859 Disorder of bone density and structure, unspecified: Secondary | ICD-10-CM | POA: Diagnosis not present

## 2018-11-12 DIAGNOSIS — M159 Polyosteoarthritis, unspecified: Secondary | ICD-10-CM | POA: Diagnosis not present

## 2018-11-12 DIAGNOSIS — M545 Low back pain: Secondary | ICD-10-CM | POA: Diagnosis not present

## 2018-11-13 ENCOUNTER — Ambulatory Visit: Payer: Medicare Other | Admitting: Internal Medicine

## 2018-11-25 DIAGNOSIS — N183 Chronic kidney disease, stage 3 (moderate): Secondary | ICD-10-CM | POA: Diagnosis not present

## 2018-11-25 DIAGNOSIS — I4891 Unspecified atrial fibrillation: Secondary | ICD-10-CM | POA: Diagnosis not present

## 2018-11-25 DIAGNOSIS — D509 Iron deficiency anemia, unspecified: Secondary | ICD-10-CM | POA: Diagnosis not present

## 2018-11-25 DIAGNOSIS — I129 Hypertensive chronic kidney disease with stage 1 through stage 4 chronic kidney disease, or unspecified chronic kidney disease: Secondary | ICD-10-CM | POA: Diagnosis not present

## 2018-11-25 DIAGNOSIS — I701 Atherosclerosis of renal artery: Secondary | ICD-10-CM | POA: Diagnosis not present

## 2018-11-26 DIAGNOSIS — N183 Chronic kidney disease, stage 3 (moderate): Secondary | ICD-10-CM | POA: Diagnosis not present

## 2018-12-09 ENCOUNTER — Other Ambulatory Visit: Payer: Self-pay | Admitting: Internal Medicine

## 2018-12-12 ENCOUNTER — Other Ambulatory Visit: Payer: Self-pay | Admitting: *Deleted

## 2018-12-12 MED ORDER — METFORMIN HCL 1000 MG PO TABS: 1000.0000 mg | ORAL_TABLET | Freq: Two times a day (BID) | ORAL | 1 refills | Status: DC

## 2018-12-12 NOTE — Telephone Encounter (Signed)
Patient Requested refill Sent to pharmacy

## 2018-12-15 ENCOUNTER — Other Ambulatory Visit: Payer: Self-pay | Admitting: *Deleted

## 2018-12-15 MED ORDER — METFORMIN HCL 1000 MG PO TABS: 1000.0000 mg | ORAL_TABLET | Freq: Two times a day (BID) | ORAL | 0 refills | Status: AC

## 2018-12-15 NOTE — Telephone Encounter (Signed)
Patient called and stated that she needs a Local Rx called into pharmacy until she can get her Mail order Rx. Faxed.

## 2018-12-30 ENCOUNTER — Inpatient Hospital Stay (HOSPITAL_COMMUNITY)
Admission: EM | Admit: 2018-12-30 | Discharge: 2019-02-04 | DRG: 871 | Disposition: E | Payer: Medicare Other | Source: Ambulatory Visit | Attending: Pulmonary Disease | Admitting: Pulmonary Disease

## 2018-12-30 ENCOUNTER — Encounter (HOSPITAL_COMMUNITY): Payer: Self-pay

## 2018-12-30 ENCOUNTER — Emergency Department (HOSPITAL_COMMUNITY): Payer: Medicare Other

## 2018-12-30 ENCOUNTER — Encounter: Payer: Self-pay | Admitting: Nurse Practitioner

## 2018-12-30 ENCOUNTER — Other Ambulatory Visit: Payer: Self-pay

## 2018-12-30 ENCOUNTER — Ambulatory Visit (INDEPENDENT_AMBULATORY_CARE_PROVIDER_SITE_OTHER): Payer: Medicare Other | Admitting: Nurse Practitioner

## 2018-12-30 VITALS — BP 138/80 | HR 91 | Temp 101.6°F | Resp 26 | Ht 62.0 in | Wt 138.0 lb

## 2018-12-30 DIAGNOSIS — R112 Nausea with vomiting, unspecified: Secondary | ICD-10-CM

## 2018-12-30 DIAGNOSIS — J9601 Acute respiratory failure with hypoxia: Secondary | ICD-10-CM | POA: Diagnosis present

## 2018-12-30 DIAGNOSIS — R0602 Shortness of breath: Secondary | ICD-10-CM

## 2018-12-30 DIAGNOSIS — I130 Hypertensive heart and chronic kidney disease with heart failure and stage 1 through stage 4 chronic kidney disease, or unspecified chronic kidney disease: Secondary | ICD-10-CM | POA: Diagnosis present

## 2018-12-30 DIAGNOSIS — J90 Pleural effusion, not elsewhere classified: Secondary | ICD-10-CM

## 2018-12-30 DIAGNOSIS — I491 Atrial premature depolarization: Secondary | ICD-10-CM | POA: Diagnosis not present

## 2018-12-30 DIAGNOSIS — J80 Acute respiratory distress syndrome: Secondary | ICD-10-CM | POA: Diagnosis not present

## 2018-12-30 DIAGNOSIS — K7200 Acute and subacute hepatic failure without coma: Secondary | ICD-10-CM | POA: Diagnosis not present

## 2018-12-30 DIAGNOSIS — I716 Thoracoabdominal aortic aneurysm, without rupture: Secondary | ICD-10-CM | POA: Diagnosis present

## 2018-12-30 DIAGNOSIS — I480 Paroxysmal atrial fibrillation: Secondary | ICD-10-CM | POA: Diagnosis present

## 2018-12-30 DIAGNOSIS — Z8249 Family history of ischemic heart disease and other diseases of the circulatory system: Secondary | ICD-10-CM

## 2018-12-30 DIAGNOSIS — Z66 Do not resuscitate: Secondary | ICD-10-CM | POA: Diagnosis not present

## 2018-12-30 DIAGNOSIS — E875 Hyperkalemia: Secondary | ICD-10-CM | POA: Diagnosis present

## 2018-12-30 DIAGNOSIS — A419 Sepsis, unspecified organism: Principal | ICD-10-CM

## 2018-12-30 DIAGNOSIS — N183 Chronic kidney disease, stage 3 unspecified: Secondary | ICD-10-CM | POA: Diagnosis present

## 2018-12-30 DIAGNOSIS — Z8672 Personal history of thrombophlebitis: Secondary | ICD-10-CM

## 2018-12-30 DIAGNOSIS — R5381 Other malaise: Secondary | ICD-10-CM

## 2018-12-30 DIAGNOSIS — R6520 Severe sepsis without septic shock: Secondary | ICD-10-CM

## 2018-12-30 DIAGNOSIS — Z8673 Personal history of transient ischemic attack (TIA), and cerebral infarction without residual deficits: Secondary | ICD-10-CM

## 2018-12-30 DIAGNOSIS — I5033 Acute on chronic diastolic (congestive) heart failure: Secondary | ICD-10-CM | POA: Diagnosis not present

## 2018-12-30 DIAGNOSIS — I2510 Atherosclerotic heart disease of native coronary artery without angina pectoris: Secondary | ICD-10-CM | POA: Diagnosis present

## 2018-12-30 DIAGNOSIS — R1010 Upper abdominal pain, unspecified: Secondary | ICD-10-CM | POA: Diagnosis not present

## 2018-12-30 DIAGNOSIS — N179 Acute kidney failure, unspecified: Secondary | ICD-10-CM | POA: Diagnosis not present

## 2018-12-30 DIAGNOSIS — E785 Hyperlipidemia, unspecified: Secondary | ICD-10-CM | POA: Diagnosis present

## 2018-12-30 DIAGNOSIS — R5383 Other fatigue: Secondary | ICD-10-CM | POA: Diagnosis not present

## 2018-12-30 DIAGNOSIS — I252 Old myocardial infarction: Secondary | ICD-10-CM

## 2018-12-30 DIAGNOSIS — J189 Pneumonia, unspecified organism: Secondary | ICD-10-CM

## 2018-12-30 DIAGNOSIS — R10817 Generalized abdominal tenderness: Secondary | ICD-10-CM

## 2018-12-30 DIAGNOSIS — R531 Weakness: Secondary | ICD-10-CM | POA: Diagnosis not present

## 2018-12-30 DIAGNOSIS — R52 Pain, unspecified: Secondary | ICD-10-CM | POA: Diagnosis not present

## 2018-12-30 DIAGNOSIS — Z833 Family history of diabetes mellitus: Secondary | ICD-10-CM

## 2018-12-30 DIAGNOSIS — E1122 Type 2 diabetes mellitus with diabetic chronic kidney disease: Secondary | ICD-10-CM | POA: Diagnosis present

## 2018-12-30 DIAGNOSIS — R0600 Dyspnea, unspecified: Secondary | ICD-10-CM

## 2018-12-30 DIAGNOSIS — Z515 Encounter for palliative care: Secondary | ICD-10-CM | POA: Diagnosis not present

## 2018-12-30 DIAGNOSIS — I950 Idiopathic hypotension: Secondary | ICD-10-CM

## 2018-12-30 DIAGNOSIS — Z955 Presence of coronary angioplasty implant and graft: Secondary | ICD-10-CM

## 2018-12-30 DIAGNOSIS — Z86718 Personal history of other venous thrombosis and embolism: Secondary | ICD-10-CM

## 2018-12-30 DIAGNOSIS — I10 Essential (primary) hypertension: Secondary | ICD-10-CM | POA: Diagnosis present

## 2018-12-30 DIAGNOSIS — R6521 Severe sepsis with septic shock: Secondary | ICD-10-CM | POA: Diagnosis not present

## 2018-12-30 DIAGNOSIS — Z0189 Encounter for other specified special examinations: Secondary | ICD-10-CM

## 2018-12-30 DIAGNOSIS — Z87891 Personal history of nicotine dependence: Secondary | ICD-10-CM

## 2018-12-30 DIAGNOSIS — K760 Fatty (change of) liver, not elsewhere classified: Secondary | ICD-10-CM | POA: Diagnosis not present

## 2018-12-30 DIAGNOSIS — J9690 Respiratory failure, unspecified, unspecified whether with hypoxia or hypercapnia: Secondary | ICD-10-CM

## 2018-12-30 DIAGNOSIS — Z823 Family history of stroke: Secondary | ICD-10-CM

## 2018-12-30 DIAGNOSIS — R1084 Generalized abdominal pain: Secondary | ICD-10-CM | POA: Diagnosis not present

## 2018-12-30 DIAGNOSIS — D649 Anemia, unspecified: Secondary | ICD-10-CM | POA: Diagnosis present

## 2018-12-30 DIAGNOSIS — E861 Hypovolemia: Secondary | ICD-10-CM | POA: Diagnosis present

## 2018-12-30 DIAGNOSIS — A084 Viral intestinal infection, unspecified: Secondary | ICD-10-CM

## 2018-12-30 DIAGNOSIS — J181 Lobar pneumonia, unspecified organism: Secondary | ICD-10-CM | POA: Diagnosis not present

## 2018-12-30 DIAGNOSIS — I714 Abdominal aortic aneurysm, without rupture: Secondary | ICD-10-CM | POA: Diagnosis present

## 2018-12-30 DIAGNOSIS — E1129 Type 2 diabetes mellitus with other diabetic kidney complication: Secondary | ICD-10-CM | POA: Diagnosis present

## 2018-12-30 DIAGNOSIS — E1151 Type 2 diabetes mellitus with diabetic peripheral angiopathy without gangrene: Secondary | ICD-10-CM | POA: Diagnosis present

## 2018-12-30 DIAGNOSIS — N170 Acute kidney failure with tubular necrosis: Secondary | ICD-10-CM | POA: Diagnosis not present

## 2018-12-30 DIAGNOSIS — G9341 Metabolic encephalopathy: Secondary | ICD-10-CM | POA: Diagnosis not present

## 2018-12-30 DIAGNOSIS — Z978 Presence of other specified devices: Secondary | ICD-10-CM

## 2018-12-30 DIAGNOSIS — Z7983 Long term (current) use of bisphosphonates: Secondary | ICD-10-CM

## 2018-12-30 DIAGNOSIS — Z951 Presence of aortocoronary bypass graft: Secondary | ICD-10-CM

## 2018-12-30 DIAGNOSIS — E871 Hypo-osmolality and hyponatremia: Secondary | ICD-10-CM | POA: Diagnosis present

## 2018-12-30 DIAGNOSIS — I712 Thoracic aortic aneurysm, without rupture: Secondary | ICD-10-CM | POA: Diagnosis present

## 2018-12-30 DIAGNOSIS — R069 Unspecified abnormalities of breathing: Secondary | ICD-10-CM

## 2018-12-30 LAB — CBC WITH DIFFERENTIAL/PLATELET
Abs Immature Granulocytes: 0 10*3/uL (ref 0.00–0.07)
Basophils Absolute: 0 10*3/uL (ref 0.0–0.1)
Basophils Relative: 0 %
Eosinophils Absolute: 0 10*3/uL (ref 0.0–0.5)
Eosinophils Relative: 0 %
HCT: 32.5 % — ABNORMAL LOW (ref 36.0–46.0)
HEMOGLOBIN: 9.9 g/dL — AB (ref 12.0–15.0)
Immature Granulocytes: 1 %
LYMPHS PCT: 3 %
Lymphs Abs: 0.5 10*3/uL — ABNORMAL LOW (ref 0.7–4.0)
MCH: 27.2 pg (ref 26.0–34.0)
MCHC: 30.5 g/dL (ref 30.0–36.0)
MCV: 89.3 fL (ref 80.0–100.0)
Monocytes Absolute: 1.1 10*3/uL — ABNORMAL HIGH (ref 0.1–1.0)
Monocytes Relative: 7 %
Neutro Abs: 14.6 10*3/uL — ABNORMAL HIGH (ref 1.7–7.7)
Neutrophils Relative %: 89 %
Platelets: 189 10*3/uL (ref 150–400)
RBC: 3.64 MIL/uL — AB (ref 3.87–5.11)
RDW: 15.3 % (ref 11.5–15.5)
WBC: 16.4 10*3/uL — AB (ref 4.0–10.5)
nRBC: 0 % (ref 0.0–0.2)
nRBC: 0 /100 WBC

## 2018-12-30 LAB — COMPREHENSIVE METABOLIC PANEL
ALT: 17 U/L (ref 0–44)
AST: 38 U/L (ref 15–41)
Albumin: 2.8 g/dL — ABNORMAL LOW (ref 3.5–5.0)
Alkaline Phosphatase: 99 U/L (ref 38–126)
Anion gap: 12 (ref 5–15)
BUN: 45 mg/dL — ABNORMAL HIGH (ref 8–23)
CHLORIDE: 101 mmol/L (ref 98–111)
CO2: 19 mmol/L — ABNORMAL LOW (ref 22–32)
Calcium: 8.2 mg/dL — ABNORMAL LOW (ref 8.9–10.3)
Creatinine, Ser: 2.21 mg/dL — ABNORMAL HIGH (ref 0.44–1.00)
GFR calc Af Amer: 24 mL/min — ABNORMAL LOW (ref >60)
GFR calc non Af Amer: 21 mL/min — ABNORMAL LOW (ref >60)
Glucose, Bld: 194 mg/dL — ABNORMAL HIGH (ref 70–99)
Potassium: 4.1 mmol/L (ref 3.5–5.1)
Sodium: 132 mmol/L — ABNORMAL LOW (ref 135–145)
Total Bilirubin: 0.9 mg/dL (ref 0.3–1.2)
Total Protein: 6.6 g/dL (ref 6.5–8.1)

## 2018-12-30 LAB — CBG MONITORING, ED: Glucose-Capillary: 167 mg/dL — ABNORMAL HIGH (ref 70–99)

## 2018-12-30 LAB — GLUCOSE, CAPILLARY: Glucose-Capillary: 171 mg/dL — ABNORMAL HIGH (ref 70–99)

## 2018-12-30 LAB — INFLUENZA PANEL BY PCR (TYPE A & B)
Influenza A By PCR: NEGATIVE
Influenza B By PCR: NEGATIVE

## 2018-12-30 LAB — LIPASE, BLOOD: Lipase: 41 U/L (ref 11–51)

## 2018-12-30 LAB — LACTIC ACID, PLASMA: LACTIC ACID, VENOUS: 1.8 mmol/L (ref 0.5–1.9)

## 2018-12-30 MED ORDER — LACTATED RINGERS IV BOLUS (SEPSIS)
1000.0000 mL | Freq: Once | INTRAVENOUS | Status: AC
  Administered 2018-12-30: 1000 mL via INTRAVENOUS

## 2018-12-30 MED ORDER — ONDANSETRON HCL 4 MG/2ML IJ SOLN
4.0000 mg | Freq: Four times a day (QID) | INTRAMUSCULAR | Status: DC | PRN
  Administered 2018-12-30 – 2019-01-03 (×3): 4 mg via INTRAVENOUS
  Filled 2018-12-30 (×5): qty 2

## 2018-12-30 MED ORDER — MORPHINE SULFATE (PF) 2 MG/ML IV SOLN
1.0000 mg | Freq: Once | INTRAVENOUS | Status: AC
  Administered 2018-12-30: 1 mg via INTRAVENOUS
  Filled 2018-12-30: qty 1

## 2018-12-30 MED ORDER — SODIUM CHLORIDE 0.9 % IV SOLN
INTRAVENOUS | Status: AC
  Administered 2018-12-30: 21:00:00 via INTRAVENOUS

## 2018-12-30 MED ORDER — ACETAMINOPHEN 650 MG RE SUPP: 650.0000 mg | Freq: Four times a day (QID) | RECTAL | Status: DC | PRN

## 2018-12-30 MED ORDER — SODIUM CHLORIDE 0.9 % IV SOLN
2.0000 g | INTRAVENOUS | Status: DC
  Administered 2018-12-30 – 2019-01-06 (×7): 2 g via INTRAVENOUS
  Filled 2018-12-30 (×9): qty 20

## 2018-12-30 MED ORDER — SODIUM CHLORIDE 0.9 % IV SOLN
500.0000 mg | INTRAVENOUS | Status: DC
  Administered 2018-12-30 – 2019-01-03 (×5): 500 mg via INTRAVENOUS
  Filled 2018-12-30 (×6): qty 500

## 2018-12-30 MED ORDER — LATANOPROST 0.005 % OP SOLN
1.0000 [drp] | Freq: Every day | OPHTHALMIC | Status: DC
  Administered 2018-12-30 – 2019-01-06 (×7): 1 [drp] via OPHTHALMIC
  Filled 2018-12-30 (×2): qty 2.5

## 2018-12-30 MED ORDER — HEPARIN SODIUM (PORCINE) 5000 UNIT/ML IJ SOLN
5000.0000 [IU] | Freq: Three times a day (TID) | INTRAMUSCULAR | Status: DC
  Administered 2018-12-30 – 2019-01-07 (×23): 5000 [IU] via SUBCUTANEOUS
  Filled 2018-12-30 (×23): qty 1

## 2018-12-30 MED ORDER — SODIUM CHLORIDE 0.9 % IV BOLUS
500.0000 mL | Freq: Once | INTRAVENOUS | Status: AC
  Administered 2018-12-30: 500 mL via INTRAVENOUS

## 2018-12-30 MED ORDER — ROSUVASTATIN CALCIUM 20 MG PO TABS
40.0000 mg | ORAL_TABLET | Freq: Every day | ORAL | Status: DC
  Administered 2018-12-30 – 2019-01-03 (×5): 40 mg via ORAL
  Filled 2018-12-30 (×6): qty 2

## 2018-12-30 MED ORDER — BENZONATATE 100 MG PO CAPS
100.0000 mg | ORAL_CAPSULE | Freq: Three times a day (TID) | ORAL | Status: DC | PRN
  Administered 2018-12-31 – 2019-01-01 (×3): 100 mg via ORAL
  Filled 2018-12-30 (×4): qty 1

## 2018-12-30 MED ORDER — EZETIMIBE 10 MG PO TABS
10.0000 mg | ORAL_TABLET | Freq: Every day | ORAL | Status: DC
  Administered 2018-12-30 – 2019-01-03 (×5): 10 mg via ORAL
  Filled 2018-12-30 (×5): qty 1

## 2018-12-30 MED ORDER — INSULIN ASPART 100 UNIT/ML ~~LOC~~ SOLN
0.0000 [IU] | Freq: Three times a day (TID) | SUBCUTANEOUS | Status: DC
  Administered 2018-12-31 (×2): 1 [IU] via SUBCUTANEOUS
  Administered 2019-01-01: 2 [IU] via SUBCUTANEOUS
  Administered 2019-01-02 – 2019-01-03 (×4): 1 [IU] via SUBCUTANEOUS
  Administered 2019-01-03: 2 [IU] via SUBCUTANEOUS

## 2018-12-30 MED ORDER — INSULIN ASPART 100 UNIT/ML ~~LOC~~ SOLN: 0.0000 [IU] | Freq: Every day | SUBCUTANEOUS | Status: DC

## 2018-12-30 MED ORDER — ACETAMINOPHEN 325 MG PO TABS
650.0000 mg | ORAL_TABLET | Freq: Four times a day (QID) | ORAL | Status: DC | PRN
  Administered 2018-12-31 – 2019-01-03 (×4): 650 mg via ORAL
  Filled 2018-12-30 (×5): qty 2

## 2018-12-30 NOTE — Progress Notes (Signed)
Careteam: Patient Care Team: [name], DO as PCP - General (Geriatric Medicine) [name], MD as PCP - Cardiology (Cardiology) Marylynn Pearson, MD as Consulting Physician (Ophthalmology) Madelon Lips, MD as Consulting Physician (Nephrology) Valinda Party, MD (Rheumatology) Mansouraty, Telford Nab., MD as Consulting Physician (Gastroenterology)  Advanced Directive information    Allergies  Allergen Reactions  . Iron Hives  . Vicodin [Hydrocodone-Acetaminophen] Other (See Comments)    Caused arrythmia   . Amiodarone Nausea And Vomiting  . Norvasc [Amlodipine Besylate] Nausea And Vomiting  . Promethazine Other (See Comments)    hallucinations  . Vioxx [Rofecoxib] Nausea And Vomiting    Chief Complaint  Patient presents with  . Acute Visit    Diarrhea, vomiting, fatigue, and pain all over x 1 week      HPI: Patient is a 78 y.o. female seen in the office today due to acute abdominal pain. She is here with daughter. Pt with hx of CAD s/p CABG, CHF, DM, HTN, CKD, RAS with stents, h. Pylori, yeast esophagitis.  Patient has not been feeling well for the past week. She had body aches, chills, but thought that is related to her arthritis and cold rainy weather and was taken Tylenol to relieve her pain.  Patient reported two loose stools in the past week. She reports clear foamy vomiting for the past week two times daily for the past week after she drinks water. Patient started complaining about abdominal pain last night 7/10. Daughter notices she was moaning/grunting.  Today pain is 5/10. Pain is generalized, but worse to upper abdomen.  Paint reports her last stool was on 12/28/2018. Patient reports no change in urination but takes minimal PO intake. She has gotten progressively weaker over the last week and more fatigue.  She is laying her head on exam table during OV. Unable to sit up for long.  Reports increased shortness of breath with cough and congestion for ~1 week.  Was unaware of fever.  Review of Systems:  Review of Systems  Constitutional: Positive for chills, fever and malaise/fatigue.  HENT: Positive for sore throat. Negative for congestion.   Respiratory: Positive for cough and shortness of breath. Negative for wheezing.   Cardiovascular: Negative for chest pain and palpitations.  Gastrointestinal: Positive for abdominal pain, nausea and vomiting.  Genitourinary: Positive for flank pain. Negative for hematuria.  Musculoskeletal: Positive for back pain.  Neurological: Positive for weakness. Negative for headaches.    Past Medical History:  Diagnosis Date  . Abdominal pain, epigastric   . Acute upper respiratory infections of unspecified site   . Arthritis   . Benign paroxysmal positional vertigo   . CAD (coronary artery disease)    prior coronary stenting  . Candidiasis of vulva and vagina   . Carotid artery disease ([place]) 2001   s/p Bilateral CEA, after CVA  . Cervicalgia   . Decreased pedal pulses 06/04/2011   doppler - bilateral ABIs no evidence of insufficiency; L CIA slightly elevated velocities 20-30% diameter reduction;   . Dyspnea 03/13/2006   Myoview - EF 76%; mild ischemia in apical lateral region, less severe than previous study in 2002  . Edema   . H/O carotid endarterectomy 03/06/2012   CT angio -high grade stenosis of proximal R internal carotid w/ lg posterior ulceration or dissection flap; near occlusive stenosis at origin of nondominantproximal L vertebral artery; <50% stenosis of proximal R vertebral and proximal L subclavian artery  . H/O endarterectomy 09/11/2012   patent  L and R carotid sites, R 60-79% restenosed ICA; L <40% restenosed ICA  . H/O endarterectomy 01/08/2012   doppler - R distal common carotid/proximal ICA stenosed 80-99%, L 0-39%  . HTN (hypertension) 11/27/2012  . Hyperlipidemia   . Limb pain 05/02/2011   doppler - no evidence of thrombus of thrombophlebitis  . Long term (current) use of anticoagulants   .  Loss of weight   . Lumbago   . Myalgia and myositis, unspecified   . Myocardial infarction ([place])   . Nonspecific abnormal results of liver function study   . Nontoxic uninodular goiter   . Osteoarthrosis, unspecified whether generalized or localized, unspecified site   . Other malaise and fatigue   . Other manifestations of vitamin A deficiency   . PAF (paroxysmal atrial fibrillation) ([place]) 02/21/2011  . Pain in joint, pelvic region and thigh   . Pain in limb   . Peripheral vascular disease ([place])   . Phlebitis and thrombophlebitis of superficial vessels of lower extremities   . Primary localized osteoarthrosis, hand   . Pulmonary HTN ([place]) 12/03/2012  . Renal artery stenosis ([place])   . Routine gynecological examination   . Spinal stenosis, unspecified region other than cervical   . Stenosis of artery ([place]) 06/03/2012   doppler- most distal aspect of abd aorta no aneurysmal dilation; bilateral ABIs - mild L arterial insufficiency, L CIA narrowing w/ 50-69% diameter reduction; L and R SFA both w/ 0-49% diameter reductions  . Stroke ([place]) 2001  . Superficial vein thrombosis 05/11/2015   Left forehead  . Tobacco use disorder   . TR (tricuspid regurgitation), severe 12/02/12 for repair with CABG 12/03/2012  . Type II or unspecified type diabetes mellitus with peripheral circulatory disorders, not stated as uncontrolled(250.70)   . Unspecified cataract   . Unspecified disorder of iris and ciliary body   . Unspecified vitamin D deficiency   . Urinary tract infection, site not specified    Past Surgical History:  Procedure Laterality Date  . ABDOMINAL HYSTERECTOMY  1975  . ANGIOPLASTY  1984 and 1982  . APPENDECTOMY  1954  . ARCH AORTOGRAM  03/30/2011   40% recurrent stenosis at origin of R proximal carotid patch, shelf like recurrent stenosis w/in midsection of patch that does not appear to be flow limiting; normal non-stenosed great vessel origins  . BACK SURGERY  '84,'86, '87   three  previous surgeries  . CAROTID ENDARTERECTOMY  2001   Bilateral  . CORONARY ARTERY BYPASS GRAFT  12/04/2012   Procedure: CORONARY ARTERY BYPASS GRAFTING (CABG);  Surgeon: Ivin Poot, MD; LIMA-LAD, SVG-OM, SVG-RCA  . ESOPHAGOGASTRODUODENOSCOPY (EGD) WITH PROPOFOL N/A 08/27/2018   Procedure: ESOPHAGOGASTRODUODENOSCOPY (EGD) WITH PROPOFOL;  Surgeon: Mauri Pole, MD;  Location: [facility];  Service: Endoscopy;  Laterality: N/A;  . INTRAOPERATIVE TRANSESOPHAGEAL ECHOCARDIOGRAM  12/04/2012   Procedure: INTRAOPERATIVE TRANSESOPHAGEAL ECHOCARDIOGRAM;  Surgeon: Ivin Poot, MD;  Location: [place];  Service: Open Heart Surgery;  Laterality: N/A;  . KIDNEY STONE SURGERY     Removal  . LEFT HEART CATHETERIZATION WITH CORONARY ANGIOGRAM N/A 11/28/2012   Procedure: LEFT HEART CATHETERIZATION WITH CORONARY ANGIOGRAM;  Surgeon: Leonie Man, MD;  Location: [facility] CATH LAB;  Service: Cardiovascular;  Laterality: N/A;  . PERCUTANEOUS CORONARY STENT INTERVENTION (PCI-S)  2002   2 BMS in ostial/Prox & mid RCA (mid 2.5 mm 20x  mm, & 2.75 mm x  8 mm ostial)  . PERIPHERAL VASCULAR INTERVENTION  07/21/2018   Procedure: PERIPHERAL VASCULAR INTERVENTION;  Surgeon: Lorretta Harp, MD;  Location: [place] CV LAB;  Service: Cardiovascular;;  bilateral renal stents  . RENAL ANGIOGRAM N/A 11/30/2013   Procedure: RENAL ANGIOGRAM;  Surgeon: Lorretta Harp, MD;  Location: [facility] CATH LAB;  Service: Cardiovascular;  Laterality: N/A;  . RENAL ANGIOGRAPHY N/A 07/21/2018   Procedure: RENAL ANGIOGRAPHY;  Surgeon: Lorretta Harp, MD;  Location: [place] [facility] LAB;  Service: Cardiovascular;  Laterality: N/A;  . RIGHT HEART CATHETERIZATION N/A 12/02/2012   Procedure: RIGHT HEART CATH;  Surgeon: Troy Sine, MD;  Location: [facility] CATH LAB;  Service: Cardiovascular;  Laterality: N/A;  . TEE WITH CARDIOVERSION  01/23/2011   EF 63-84%; grade 2 diastolic dysfunction, elevated mean LA filling pressure, RV systolic pressure  increased consistent w/ mod pulmonary hypertension   Social History:   reports that she quit smoking about 6 years ago. Her smoking use included cigarettes. She has a 17.10 pack-year smoking history. She has never used smokeless tobacco. She reports current alcohol use. She reports that she does not use drugs.  Family History  Problem Relation Age of Onset  . Other Mother        CVA  . Diabetes Mother   . Heart disease Mother        Heart disease before age 62  . Heart attack Mother   . Heart disease Father        Heart Disease before age 39  . Heart attack Father   . Heart disease Sister        Amputation  . Diabetes Sister   . Heart disease Brother        Heart disease before age 67  . Diabetes Brother   . Stroke Brother   . Stroke Brother   . Heart disease Brother   . Stroke Brother   . Heart disease Brother   . Heart disease Sister   . Heart disease Sister   . Diabetes Daughter   . Cancer Sister   . Diabetes Other   . Cancer Other        breast  . Heart disease Other        cad  . Other Other        cva  . Colon cancer Neg Hx   . Esophageal cancer Neg Hx   . Inflammatory bowel disease Neg Hx   . Liver disease Neg Hx   . Pancreatic cancer Neg Hx   . Rectal cancer Neg Hx   . Stomach cancer Neg Hx     Medications: Patient's Medications  New Prescriptions   No medications on file  Previous Medications   ALENDRONATE (FOSAMAX) 70 MG TABLET    Take 70 mg by mouth every Friday.    BIMATOPROST (LUMIGAN) 0.01 % SOLN    Place 1 drop into both eyes at bedtime.   CARVEDILOL (COREG) 12.5 MG TABLET    Take 1 tablet (12.5 mg total) by mouth 2 (two) times daily with a meal.   EZETIMIBE (ZETIA) 10 MG TABLET    Take 10 mg by mouth at bedtime.    GLUCOSE BLOOD (ONETOUCH VERIO) TEST STRIP    Use as instructed to test blood sugar twice daily DX: E11.29   METFORMIN (GLUCOPHAGE) 1000 MG TABLET    Take 1 tablet (1,000 mg total) by mouth 2 (two) times daily with a meal.    NITROGLYCERIN (NITROSTAT) 0.4 MG SL TABLET    Place 1 tablet (0.4 mg total) under the tongue every  5 (five) minutes as needed for chest pain.   ROSUVASTATIN (CRESTOR) 40 MG TABLET    TAKE 1 TABLET DAILY FOR CHOLESTEROL  Modified Medications   No medications on file  Discontinued Medications   CLOPIDOGREL (PLAVIX) 75 MG TABLET    Take 1 tablet (75 mg total) by mouth daily with breakfast.     Physical Exam:  Vitals:   12/28/2018 1444  BP: 138/80  Pulse: 91  Resp: 10  Temp: (!) 101.6 F (38.7 C)  TempSrc: Oral  SpO2: 92%  Weight: 138 lb (62.6 kg)  Height: 5\' 2"  (1.575 m)   Body mass index is 25.24 kg/m.  Physical Exam Constitutional:      Appearance: She is ill-appearing.     Comments: Alert but fatigued   Cardiovascular:     Rate and Rhythm: Normal rate. Rhythm irregular.     Pulses: Normal pulses.  Pulmonary:     Effort: Tachypnea (with swallow breathing) present.     Breath sounds: Transmitted upper airway sounds present. Examination of the right-middle field reveals rhonchi. Examination of the right-lower field reveals rhonchi. Rhonchi present.  Abdominal:     Tenderness: There is abdominal tenderness in the right upper quadrant and left upper quadrant. There is left CVA tenderness and guarding. There is no right CVA tenderness.  Lymphadenopathy:     Cervical: No cervical adenopathy.  Skin:    Coloration: Skin is pale.  Neurological:     Mental Status: She is oriented to person, place, and time.     Labs reviewed: Basic Metabolic Panel: Recent Labs    07/15/18 1217  09/04/18 1234 09/07/18 2301 10/20/18 0819  NA 142   < > 135 131* 139  K 4.8   < > 4.1 4.0 4.1  CL 103   < > 94* 97* 105  CO2 22   < > 28 27 25   GLUCOSE 81   < > 119 103* 110  BUN 23   < > 16 13 27*  CREATININE 1.52*   < > 1.89* 1.63* 1.15*  CALCIUM 9.2   < > 9.7 9.2 9.7  TSH 2.330  --   --   --   --    < > = values in this interval not displayed.   Liver Function Tests: Recent Labs     06/03/18 0822 08/22/18 2319 09/04/18 1234  AST 14 50* 25  ALT 6 28 11   ALKPHOS  --  60  --   BILITOT 0.7 0.8 0.6  PROT 6.1 6.5 6.8  ALBUMIN  --  2.7*  --    Recent Labs    08/22/18 2319  LIPASE 35   No results for input(s): AMMONIA in the last 8760 hours. CBC: Recent Labs    08/29/18 0511 09/04/18 1234 09/07/18 2301 10/20/18 0819  WBC 4.4 8.2 7.6 4.2  NEUTROABS 2.6 5,551  --  2,520  HGB 8.0* 11.0* 11.0* 10.7*  HCT 25.6* 35.3 35.5* 33.4*  MCV 75.7* 78.6* 78.9* 83.7  PLT 242 344 247 163   Lipid Panel: Recent Labs    06/03/18 0822  CHOL 147  HDL 45*  LDLCALC 86  TRIG 69  CHOLHDL 3.3   TSH: Recent Labs    07/15/18 1217  TSH 2.330   A1C: Lab Results  Component Value Date   HGBA1C 6.1 (H) 09/04/2018     Assessment/Plan 1. Shortness of breath 2. Nausea and vomiting, intractability of vomiting not specified, unspecified vomiting type 3. Pain of upper abdomen  4. Malaise and fatigue  Pt with  shortness of breath, chest congestion, fever and chills with acute abdominal pain, nausea/vomiting and minimal PO intake. She has gotten progressively weaker with increase fatigue. Sent to ED via EMS for further evaluation and treatment.   Carlos American. [place], [facility] [phone]

## 2018-12-30 NOTE — ED Notes (Signed)
Pt provided ice chips per EDP, [place].

## 2018-12-30 NOTE — ED Notes (Signed)
Family at bedside. 

## 2018-12-30 NOTE — ED Notes (Signed)
DTR sent to Pt's room

## 2018-12-30 NOTE — ED Notes (Signed)
Admitting at bedside 

## 2018-12-30 NOTE — ED Provider Notes (Addendum)
[facility] EMERGENCY DEPARTMENT Provider Note   CSN: [id number] Arrival date & time: 12/15/2018  1633    History   Chief Complaint Chief Complaint  Patient presents with  . Weakness    HPI Kendra Todd is a 78 y.o. female.     Patient is a 78 year old female with a history of coronary artery disease, paroxysmal atrial fibrillation, hypertension, CHF with most recent EF 60 to 65%, chronic kidney disease and AAA who is presenting today with 1 week of fever, myalgias, worsening abdominal pain over the last 4 days.  Patient states last week she had some episodes of diarrhea which have since stopped.  However the nausea and vomiting has worsened in the last few days.  She continues to have fevers.  She is also having cough and shortness of breath.  She denies any rashes or urinary symptoms.  The history is provided by the patient.  Weakness  Severity:  Severe Onset quality:  Gradual Duration:  1 week Timing:  Constant Progression:  Worsening Chronicity:  New Associated symptoms: abdominal pain, anorexia, cough, diarrhea, fever, lethargy, myalgias, nausea, shortness of breath and vomiting   Associated symptoms: no loss of consciousness and no melena   Risk factors: congestive heart failure and coronary artery disease     Past Medical History:  Diagnosis Date  . Abdominal pain, epigastric   . Acute upper respiratory infections of unspecified site   . Arthritis   . Benign paroxysmal positional vertigo   . CAD (coronary artery disease)    prior coronary stenting  . Candidiasis of vulva and vagina   . Carotid artery disease ([place]) 2001   s/p Bilateral CEA, after CVA  . Cervicalgia   . Decreased pedal pulses 06/04/2011   doppler - bilateral ABIs no evidence of insufficiency; L CIA slightly elevated velocities 20-30% diameter reduction;   . Dyspnea 03/13/2006   Myoview - EF 76%; mild ischemia in apical lateral region, less severe than previous study in 2002  . Edema    . H/O carotid endarterectomy 03/06/2012   CT angio -high grade stenosis of proximal R internal carotid w/ lg posterior ulceration or dissection flap; near occlusive stenosis at origin of nondominantproximal L vertebral artery; <50% stenosis of proximal R vertebral and proximal L subclavian artery  . H/O endarterectomy 09/11/2012   patent L and R carotid sites, R 60-79% restenosed ICA; L <40% restenosed ICA  . H/O endarterectomy 01/08/2012   doppler - R distal common carotid/proximal ICA stenosed 80-99%, L 0-39%  . HTN (hypertension) 11/27/2012  . Hyperlipidemia   . Limb pain 05/02/2011   doppler - no evidence of thrombus of thrombophlebitis  . Long term (current) use of anticoagulants   . Loss of weight   . Lumbago   . Myalgia and myositis, unspecified   . Myocardial infarction ([place])   . Nonspecific abnormal results of liver function study   . Nontoxic uninodular goiter   . Osteoarthrosis, unspecified whether generalized or localized, unspecified site   . Other malaise and fatigue   . Other manifestations of vitamin A deficiency   . PAF (paroxysmal atrial fibrillation) ([place]) 02/21/2011  . Pain in joint, pelvic region and thigh   . Pain in limb   . Peripheral vascular disease ([place])   . Phlebitis and thrombophlebitis of superficial vessels of lower extremities   . Primary localized osteoarthrosis, hand   . Pulmonary HTN ([place]) 12/03/2012  . Renal artery stenosis ([place])   . Routine gynecological examination   .  Spinal stenosis, unspecified region other than cervical   . Stenosis of artery ([place]) 06/03/2012   doppler- most distal aspect of abd aorta no aneurysmal dilation; bilateral ABIs - mild L arterial insufficiency, L CIA narrowing w/ 50-69% diameter reduction; L and R SFA both w/ 0-49% diameter reductions  . Stroke ([place]) 2001  . Superficial vein thrombosis 05/11/2015   Left forehead  . Tobacco use disorder   . TR (tricuspid regurgitation), severe 12/02/12 for repair with CABG 12/03/2012  .  Type II or unspecified type diabetes mellitus with peripheral circulatory disorders, not stated as uncontrolled(250.70)   . Unspecified cataract   . Unspecified disorder of iris and ciliary body   . Unspecified vitamin D deficiency   . Urinary tract infection, site not specified     Patient Active Problem List   Diagnosis Date Noted  . Chest pain, rule out acute myocardial infarction 09/08/2018  . History of stent insertion of renal artery 09/04/2018  . Helicobacter pylori gastritis 09/04/2018  . Candidal esophagitis ([place]) 09/04/2018  . Abdominal pain   . H. pylori infection   . Nausea & vomiting 08/23/2018  . Orthostatic hypotension 08/23/2018  . LLQ abdominal pain 08/06/2018  . Long term (current) use of antithrombotics/antiplatelets 08/06/2018  . Other dysphagia 08/06/2018  . Other fatigue 07/10/2018  . Melena 07/10/2018  . Labile blood pressure 07/10/2018  . Renovascular hypertension 06/23/2018  . Pulmonary artery hypertension ([place]) 06/23/2018  . Aortic mural thrombus ([place]) 02/13/2018  . Thoracic back pain 02/05/2018  . Hypertensive urgency 02/03/2018  . Chest pain 02/03/2018  . Anemia 01/20/2018  . Sacroiliitis ([place]) 04/10/2017  . Pruritus 04/10/2017  . Edema 12/12/2016  . Chronic diastolic CHF (congestive heart failure), NYHA class 2 ([place]) 09/25/2016  . Thoracoabdominal aortic aneurysm (TAAA) without rupture ([place]) 02/27/2016  . CKD (chronic kidney disease) stage 3, GFR 30-59 ml/min (H[facility]) 02/27/2016  . Herpes zoster 05/25/2015  . Superficial vein thrombosis 05/11/2015  . Back pain 10/26/2014  . Osteoarthritis 10/26/2014  . AAA (abdominal aortic aneurysm) without rupture ([place]) 07/21/2014  . Pain in joint, shoulder region 01/20/2014  . Bilateral renal artery stenosis ([place]) 10/22/2013  . Palpitations 06/03/2013  . Atrial fibrillation ([place]) 04/25/2013  . TR (tricuspid regurgitation), severe 12/02/12 for repair with CABG 12/03/2012  . Pulmonary HTN ([place]) 12/03/2012  .  NSTEMI (non-ST elevated myocardial infarction) ([place]) 11/27/2012  . PAD (peripheral artery disease) ([place]) 11/27/2012  . CAD (coronary artery disease): 1993 PTCA to circumflex.  2002 2 BMS stents to ostial, S/P CABG x 3 12/04/12 11/27/2012  . DM (diabetes mellitus), type 2 with renal complications ([place]) 15/37/9432  . HTN (hypertension) 11/27/2012  . Hypercholesterolemia 11/27/2012  . Bilateral carotid artery stenosis 02/14/2012    Past Surgical History:  Procedure Laterality Date  . ABDOMINAL HYSTERECTOMY  1975  . ANGIOPLASTY  1984 and 1982  . APPENDECTOMY  1954  . ARCH AORTOGRAM  03/30/2011   40% recurrent stenosis at origin of R proximal carotid patch, shelf like recurrent stenosis w/in midsection of patch that does not appear to be flow limiting; normal non-stenosed great vessel origins  . BACK SURGERY  '84,'86, '87   three previous surgeries  . CAROTID ENDARTERECTOMY  2001   Bilateral  . CORONARY ARTERY BYPASS GRAFT  12/04/2012   Procedure: CORONARY ARTERY BYPASS GRAFTING (CABG);  Surgeon: Ivin Poot, MD; LIMA-LAD, SVG-OM, SVG-RCA  . ESOPHAGOGASTRODUODENOSCOPY (EGD) WITH PROPOFOL N/A 08/27/2018   Procedure: ESOPHAGOGASTRODUODENOSCOPY (EGD) WITH PROPOFOL;  Surgeon: Mauri Pole, MD;  Location: [facility];  Service: Endoscopy;  Laterality: N/A;  . INTRAOPERATIVE TRANSESOPHAGEAL ECHOCARDIOGRAM  12/04/2012   Procedure: INTRAOPERATIVE TRANSESOPHAGEAL ECHOCARDIOGRAM;  Surgeon: Ivin Poot, MD;  Location: [place];  Service: Open Heart Surgery;  Laterality: N/A;  . KIDNEY STONE SURGERY     Removal  . LEFT HEART CATHETERIZATION WITH CORONARY ANGIOGRAM N/A 11/28/2012   Procedure: LEFT HEART CATHETERIZATION WITH CORONARY ANGIOGRAM;  Surgeon: Leonie Man, MD;  Location: [facility] CATH LAB;  Service: Cardiovascular;  Laterality: N/A;  . PERCUTANEOUS CORONARY STENT INTERVENTION (PCI-S)  2002   2 BMS in ostial/Prox & mid RCA (mid 2.5 mm 20x  mm, & 2.75 mm x  8 mm ostial)  . PERIPHERAL  VASCULAR INTERVENTION  07/21/2018   Procedure: PERIPHERAL VASCULAR INTERVENTION;  Surgeon: Lorretta Harp, MD;  Location: [facility] CV LAB;  Service: Cardiovascular;;  bilateral renal stents  . RENAL ANGIOGRAM N/A 11/30/2013   Procedure: RENAL ANGIOGRAM;  Surgeon: Lorretta Harp, MD;  Location: [facility] CATH LAB;  Service: Cardiovascular;  Laterality: N/A;  . RENAL ANGIOGRAPHY N/A 07/21/2018   Procedure: RENAL ANGIOGRAPHY;  Surgeon: Lorretta Harp, MD;  Location: [facility] CV LAB;  Service: Cardiovascular;  Laterality: N/A;  . RIGHT HEART CATHETERIZATION N/A 12/02/2012   Procedure: RIGHT HEART CATH;  Surgeon: Troy Sine, MD;  Location: [facility] CATH LAB;  Service: Cardiovascular;  Laterality: N/A;  . TEE WITH CARDIOVERSION  01/23/2011   EF 35-46%; grade 2 diastolic dysfunction, elevated mean LA filling pressure, RV systolic pressure increased consistent w/ mod pulmonary hypertension     OB History   No obstetric history on file.      Home Medications    Prior to Admission medications   Medication Sig Start Date End Date Taking? Authorizing Provider  alendronate (FOSAMAX) 70 MG tablet Take 70 mg by mouth every Friday.  07/13/16   [provider]  bimatoprost (LUMIGAN) 0.01 % SOLN Place 1 drop into both eyes at bedtime.    [provider]  carvedilol (COREG) 12.5 MG tablet Take 1 tablet (12.5 mg total) by mouth 2 (two) times daily with a meal. 09/09/18   Black, Lezlie Octave, NP  ezetimibe (ZETIA) 10 MG tablet Take 10 mg by mouth at bedtime.     [provider]  glucose blood (ONETOUCH VERIO) test strip Use as instructed to test blood sugar twice daily DX: E11.29 09/04/18   Reed, Tiffany L, DO  metFORMIN (GLUCOPHAGE) 1000 MG tablet Take 1 tablet (1,000 mg total) by mouth 2 (two) times daily with a meal. 12/15/18   Reed, Tiffany L, DO  nitroGLYCERIN (NITROSTAT) 0.4 MG SL tablet Place 1 tablet (0.4 mg total) under the tongue every 5 (five) minutes as needed for chest pain.  01/31/18   Croitoru, Mihai, MD  rosuvastatin (CRESTOR) 40 MG tablet TAKE 1 TABLET DAILY FOR CHOLESTEROL 12/09/18   [name], DO    Family History Family History  Problem Relation Age of Onset  . Other Mother        CVA  . Diabetes Mother   . Heart disease Mother        Heart disease before age 33  . Heart attack Mother   . Heart disease Father        Heart Disease before age 87  . Heart attack Father   . Heart disease Sister        Amputation  . Diabetes Sister   . Heart disease Brother  Heart disease before age 41  . Diabetes Brother   . Stroke Brother   . Stroke Brother   . Heart disease Brother   . Stroke Brother   . Heart disease Brother   . Heart disease Sister   . Heart disease Sister   . Diabetes Daughter   . Cancer Sister   . Diabetes Other   . Cancer Other        breast  . Heart disease Other        cad  . Other Other        cva  . Colon cancer Neg Hx   . Esophageal cancer Neg Hx   . Inflammatory bowel disease Neg Hx   . Liver disease Neg Hx   . Pancreatic cancer Neg Hx   . Rectal cancer Neg Hx   . Stomach cancer Neg Hx     Social History Social History   Tobacco Use  . Smoking status: Former Smoker    Packs/day: 0.30    Years: 57.00    Pack years: 17.10    Types: Cigarettes    Last attempt to quit: 11/19/2012    Years since quitting: 6.1  . Smokeless tobacco: Never Used  Substance Use Topics  . Alcohol use: Yes    Comment: rarely, 1 a month  . Drug use: No     Allergies   Iron; Vicodin [hydrocodone-acetaminophen]; Amiodarone; Norvasc [amlodipine besylate]; Promethazine; and Vioxx [rofecoxib]   Review of Systems Review of Systems  Constitutional: Positive for fever.  Respiratory: Positive for cough and shortness of breath.   Gastrointestinal: Positive for abdominal pain, anorexia, diarrhea, nausea and vomiting. Negative for melena.  Musculoskeletal: Positive for myalgias.  Neurological: Positive for weakness. Negative for  loss of consciousness.  All other systems reviewed and are negative.    Physical Exam Updated Vital Signs BP (!) 101/56 (BP Location: Right Arm)   Pulse 89   Temp (!) 102.2 F (39 C) (Oral)   Resp 20   SpO2 92%   Physical Exam Vitals signs and nursing note reviewed.  Constitutional:      General: She is not in acute distress.    Appearance: She is well-developed. She is ill-appearing.  HENT:     Head: Normocephalic and atraumatic.     Mouth/Throat:     Mouth: Mucous membranes are dry.     Pharynx: No oropharyngeal exudate or posterior oropharyngeal erythema.  Eyes:     Pupils: Pupils are equal, round, and reactive to light.  Cardiovascular:     Rate and Rhythm: Normal rate and regular rhythm.     Heart sounds: Normal heart sounds. No murmur. No friction rub.  Pulmonary:     Effort: Pulmonary effort is normal. Tachypnea present.     Breath sounds: Examination of the right-lower field reveals decreased breath sounds and rales. Examination of the left-lower field reveals decreased breath sounds. Decreased breath sounds and rales present. No wheezing.  Abdominal:     General: Bowel sounds are normal. There is no distension.     Palpations: Abdomen is soft.     Tenderness: There is abdominal tenderness. There is no guarding or rebound.     Comments: Mild diffuse tenderness worst in the right upper quadrant  Musculoskeletal: Normal range of motion.        General: No tenderness.     Right lower leg: No edema.     Left lower leg: No edema.     Comments: No edema  Skin:    General: Skin is warm and dry.     Capillary Refill: Capillary refill takes 2 to 3 seconds.     Findings: No rash.     Comments: Poor skin turgor  Neurological:     Mental Status: She is alert and oriented to person, place, and time. Mental status is at baseline.     Cranial Nerves: No cranial nerve deficit.  Psychiatric:        Mood and Affect: Mood normal.        Behavior: Behavior normal.         Thought Content: Thought content normal.      ED Treatments / Results  Labs (all labs ordered are listed, but only abnormal results are displayed) Labs Reviewed  COMPREHENSIVE METABOLIC PANEL - Abnormal; Notable for the following components:      Result Value   Sodium 132 (*)    CO2 19 (*)    Glucose, Bld 194 (*)    BUN 45 (*)    Creatinine, Ser 2.21 (*)    Calcium 8.2 (*)    Albumin 2.8 (*)    GFR calc non Af Amer 21 (*)    GFR calc Af Amer 24 (*)    All other components within normal limits  CBC WITH DIFFERENTIAL/PLATELET - Abnormal; Notable for the following components:   WBC 16.4 (*)    RBC 3.64 (*)    Hemoglobin 9.9 (*)    HCT 32.5 (*)    Neutro Abs 14.6 (*)    Lymphs Abs 0.5 (*)    Monocytes Absolute 1.1 (*)    All other components within normal limits  CBG MONITORING, ED - Abnormal; Notable for the following components:   Glucose-Capillary 167 (*)    All other components within normal limits  CULTURE, BLOOD (ROUTINE X 2)  CULTURE, BLOOD (ROUTINE X 2)  URINE CULTURE  LACTIC ACID, PLASMA  LIPASE, BLOOD  INFLUENZA PANEL BY PCR (TYPE A & B)  URINALYSIS, ROUTINE W REFLEX MICROSCOPIC    EKG EKG Interpretation  Date/Time:  Tuesday December 30 2018 16:54:02 EST Ventricular Rate:  89 PR Interval:    QRS Duration: 108 QT Interval:  381 QTC Calculation: 464 R Axis:   46 Text Interpretation:  Sinus rhythm Incomplete left bundle branch block Low voltage, precordial leads No significant change since last tracing Confirmed by Blanchie Dessert [phone]) on 12/27/2018 5:10:30 PM   Radiology Ct Abdomen Pelvis Wo Contrast  Result Date: 12/13/2018 CLINICAL DATA:  Weakness and abdominal pain. EXAM: CT ABDOMEN AND PELVIS WITHOUT CONTRAST TECHNIQUE: Multidetector CT imaging of the abdomen and pelvis was performed following the standard protocol without IV contrast. COMPARISON:  CT 08/26/2018. Angiogram of the abdomen and pelvis PA and lateral chest today. FINDINGS: Lower  chest: Dense airspace opacity is present in the right lower lobe. Left lung base is clear. Extensive calcific aortic and coronary atherosclerosis is noted. No pleural or pericardial effusion. Hepatobiliary: There is fatty infiltration of the liver. No focal lesion. The gallbladder and biliary tree appear normal. Pancreas: Unremarkable. No pancreatic ductal dilatation or surrounding inflammatory changes. Spleen: Normal in size without focal abnormality. Adrenals/Urinary Tract: Atrophy of the left kidney is unchanged. The right kidney appears normal. Ureters and urinary bladder are normal in appearance. The adrenal glands appear normal. Stomach/Bowel: The stomach and small and large bowel appear normal. Status post appendectomy. Vascular/Lymphatic: Extensive aortoiliac atherosclerosis is seen. 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  Bilateral renal artery stents are noted. 3.5 cm infrarenal abdominal aortic aneurysm is unchanged. Reproductive: Status post hysterectomy. No adnexal masses. Other: None. Musculoskeletal: Degenerative change about the hips and lower lumbar spine noted. IMPRESSION: Dense opacification in the right lower lobe most consistent with pneumonia. No acute abnormality within the abdomen or pelvis. Fatty infiltration of the liver. Extensive calcific aortic and coronary atherosclerosis. 3.5 cm infrarenal abdominal aortic aneurysm is unchanged. Recommend followup by ultrasound in 2 years. This recommendation follows ACR consensus guidelines: White Paper of the ACR Incidental Findings Committee II on Vascular Findings. J Am Coll Radiol 2013; 10:789-794. Aortic aneurysm NOS (ICD10-I71.9). Electronically Signed   By: Inge Rise M.D.   On: 12/15/2018 19:13   Dg Chest 2 View  Result Date: 12/07/2018 CLINICAL DATA:  Cough, fever EXAM: CHEST - 2 VIEW COMPARISON:  09/07/2018 chest CT 05/28/2018 FINDINGS: Prior CABG. Cardiomegaly. Tortuosity of the thoracic  aorta with probable aneurysmal dilatation of the descending thoracic aorta as seen on prior chest CT. Right pleural effusion and right lower lobe airspace opacity concerning for pneumonia. IMPRESSION: Right lower lobe airspace opacity with right effusion. Findings suspicious for pneumonia. Cardiomegaly. Tortuous and aneurysmal aorta as seen on prior chest CT. Electronically Signed   By: Rolm Baptise M.D.   On: 12/31/2018 18:01    Procedures Procedures (including critical care time)  Medications Ordered in ED Medications  lactated ringers bolus 1,000 mL (has no administration in time range)    And  lactated ringers bolus 1,000 mL (has no administration in time range)     Initial Impression / Assessment and Plan / ED Course  I have reviewed the triage vital signs and the nursing notes.  Pertinent labs & imaging results that were available during my care of the patient were reviewed by me and considered in my medical decision making (see chart for details).      Elderly female presenting with 1 week of fever, cough, generalized abdominal pain and nausea and vomiting.  Patient is febrile to 102 here with blood pressure of 101/56.  Code sepsis initiated.  Flu swab was also done as well as chest x-ray.  Patient was given IV fluids.  She does have a history of CHF however last echo showed an EF of 60 to 65%.  She does not appear fluid overloaded at this time.  She was also given Tylenol for her fever.  He has no headache or neck pain concerning for meningitis at this time.  Patient symptoms could be related to flu, pneumonia, abdominal pathology.  She denies any urinary symptoms.  7:21 PM Labs are consistent with a leukocytosis of 16,000 with normal hemoglobin, CMP with new AKI with a creatinine of 2.2 and normal anion gap.  Flu testing is negative.  Lactic acid within normal limits.  EKG without acute findings.  Chest x-ray with a right lower lobe pneumonia.  CT of the abdomen pelvis with pneumonia  but no other acute findings.  Patient was covered with Rocephin and azithromycin.  She received 30/kg fluid on repeat evaluation she is feeling better.  Patient will be admitted for further care.  CRITICAL CARE Performed by: Colson Barco Total critical care time: 30 minutes Critical care time was exclusive of separately billable procedures and treating other patients. Critical care was necessary to treat or prevent imminent or life-threatening deterioration. Critical care was time spent personally by me on the following activities: development of treatment plan with patient and/or surrogate as well as nursing, discussions  with consultants, evaluation of patient's response to treatment, examination of patient, obtaining history from patient or surrogate, ordering and performing treatments and interventions, ordering and review of laboratory studies, ordering and review of radiographic studies, pulse oximetry and re-evaluation of patient's condition.   Final Clinical Impressions(s) / ED Diagnoses   Final diagnoses:  AKI (acute kidney injury) ([place])  Sepsis with acute renal failure without septic shock, due to unspecified organism, unspecified acute renal failure type [facility])  Community acquired pneumonia of right lower lobe of lung [facility])    ED Discharge Orders    None       Blanchie Dessert, MD 12/28/2018 Doran Heater    Blanchie Dessert, MD 01/16/19 2141

## 2018-12-30 NOTE — H&P (Signed)
History and Physical    Kendra Todd UEA:[id number] DOB: [birth year] DOA: 12/23/2018  PCP: [name], DO Patient coming from: Home  Chief Complaint: Generalized weakness  HPI: Kendra Todd is a 78 y.o. female with medical history significant of CAD, paroxysmal atrial fibrillation, hypertension, CHF, CKD, AAA presenting to the hospital with a one-week history of right upper quadrant abdominal pain, nausea, vomiting, fevers, generalized weakness/fatigue, shortness of breath, and dry cough.  Patient states she has not been able to eat or drink much for this past 1 week.  Denies having any diarrhea.  Denies having chest pain.  Review of Systems: As per HPI otherwise 10 point review of systems negative.  Past Medical History:  Diagnosis Date  . Abdominal pain, epigastric   . Acute upper respiratory infections of unspecified site   . Arthritis   . Benign paroxysmal positional vertigo   . CAD (coronary artery disease)    prior coronary stenting  . Candidiasis of vulva and vagina   . Carotid artery disease ([place]) 2001   s/p Bilateral CEA, after CVA  . Cervicalgia   . Decreased pedal pulses 06/04/2011   doppler - bilateral ABIs no evidence of insufficiency; L CIA slightly elevated velocities 20-30% diameter reduction;   . Dyspnea 03/13/2006   Myoview - EF 76%; mild ischemia in apical lateral region, less severe than previous study in 2002  . Edema   . H/O carotid endarterectomy 03/06/2012   CT angio -high grade stenosis of proximal R internal carotid w/ lg posterior ulceration or dissection flap; near occlusive stenosis at origin of nondominantproximal L vertebral artery; <50% stenosis of proximal R vertebral and proximal L subclavian artery  . H/O endarterectomy 09/11/2012   patent L and R carotid sites, R 60-79% restenosed ICA; L <40% restenosed ICA  . H/O endarterectomy 01/08/2012   doppler - R distal common carotid/proximal ICA stenosed 80-99%, L 0-39%  . HTN (hypertension) 11/27/2012  .  Hyperlipidemia   . Limb pain 05/02/2011   doppler - no evidence of thrombus of thrombophlebitis  . Long term (current) use of anticoagulants   . Loss of weight   . Lumbago   . Myalgia and myositis, unspecified   . Myocardial infarction ([place])   . Nonspecific abnormal results of liver function study   . Nontoxic uninodular goiter   . Osteoarthrosis, unspecified whether generalized or localized, unspecified site   . Other malaise and fatigue   . Other manifestations of vitamin A deficiency   . PAF (paroxysmal atrial fibrillation) ([place]) 02/21/2011  . Pain in joint, pelvic region and thigh   . Pain in limb   . Peripheral vascular disease ([place])   . Phlebitis and thrombophlebitis of superficial vessels of lower extremities   . Primary localized osteoarthrosis, hand   . Pulmonary HTN ([place]) 12/03/2012  . Renal artery stenosis ([place])   . Routine gynecological examination   . Spinal stenosis, unspecified region other than cervical   . Stenosis of artery ([place]) 06/03/2012   doppler- most distal aspect of abd aorta no aneurysmal dilation; bilateral ABIs - mild L arterial insufficiency, L CIA narrowing w/ 50-69% diameter reduction; L and R SFA both w/ 0-49% diameter reductions  . Stroke ([place]) 2001  . Superficial vein thrombosis 05/11/2015   Left forehead  . Tobacco use disorder   . TR (tricuspid regurgitation), severe 12/02/12 for repair with CABG 12/03/2012  . Type II or unspecified type diabetes mellitus with peripheral circulatory disorders, not stated as uncontrolled(250.70)   .  Unspecified cataract   . Unspecified disorder of iris and ciliary body   . Unspecified vitamin D deficiency   . Urinary tract infection, site not specified     Past Surgical History:  Procedure Laterality Date  . ABDOMINAL HYSTERECTOMY  1975  . ANGIOPLASTY  1984 and 1982  . APPENDECTOMY  1954  . ARCH AORTOGRAM  03/30/2011   40% recurrent stenosis at origin of R proximal carotid patch, shelf like recurrent stenosis w/in  midsection of patch that does not appear to be flow limiting; normal non-stenosed great vessel origins  . BACK SURGERY  '84,'86, '87   three previous surgeries  . CAROTID ENDARTERECTOMY  2001   Bilateral  . CORONARY ARTERY BYPASS GRAFT  12/04/2012   Procedure: CORONARY ARTERY BYPASS GRAFTING (CABG);  Surgeon: Ivin Poot, MD; LIMA-LAD, SVG-OM, SVG-RCA  . ESOPHAGOGASTRODUODENOSCOPY (EGD) WITH PROPOFOL N/A 08/27/2018   Procedure: ESOPHAGOGASTRODUODENOSCOPY (EGD) WITH PROPOFOL;  Surgeon: Mauri Pole, MD;  Location: [facility];  Service: Endoscopy;  Laterality: N/A;  . INTRAOPERATIVE TRANSESOPHAGEAL ECHOCARDIOGRAM  12/04/2012   Procedure: INTRAOPERATIVE TRANSESOPHAGEAL ECHOCARDIOGRAM;  Surgeon: Ivin Poot, MD;  Location: [facility];  Service: Open Heart Surgery;  Laterality: N/A;  . KIDNEY STONE SURGERY     Removal  . LEFT HEART CATHETERIZATION WITH CORONARY ANGIOGRAM N/A 11/28/2012   Procedure: LEFT HEART CATHETERIZATION WITH CORONARY ANGIOGRAM;  Surgeon: Leonie Man, MD;  Location: [facility] CATH LAB;  Service: Cardiovascular;  Laterality: N/A;  . PERCUTANEOUS CORONARY STENT INTERVENTION (PCI-S)  2002   2 BMS in ostial/Prox & mid RCA (mid 2.5 mm 20x  mm, & 2.75 mm x  8 mm ostial)  . PERIPHERAL VASCULAR INTERVENTION  07/21/2018   Procedure: PERIPHERAL VASCULAR INTERVENTION;  Surgeon: Lorretta Harp, MD;  Location: [facility] CV LAB;  Service: Cardiovascular;;  bilateral renal stents  . RENAL ANGIOGRAM N/A 11/30/2013   Procedure: RENAL ANGIOGRAM;  Surgeon: Lorretta Harp, MD;  Location: [facility] CATH LAB;  Service: Cardiovascular;  Laterality: N/A;  . RENAL ANGIOGRAPHY N/A 07/21/2018   Procedure: RENAL ANGIOGRAPHY;  Surgeon: Lorretta Harp, MD;  Location: [facility] CV LAB;  Service: Cardiovascular;  Laterality: N/A;  . RIGHT HEART CATHETERIZATION N/A 12/02/2012   Procedure: RIGHT HEART CATH;  Surgeon: Troy Sine, MD;  Location: [facility] CATH LAB;  Service: Cardiovascular;  Laterality: N/A;   . TEE WITH CARDIOVERSION  01/23/2011   EF 74-94%; grade 2 diastolic dysfunction, elevated mean LA filling pressure, RV systolic pressure increased consistent w/ mod pulmonary hypertension     reports that she quit smoking about 6 years ago. Her smoking use included cigarettes. She has a 17.10 pack-year smoking history. She has never used smokeless tobacco. She reports current alcohol use. She reports that she does not use drugs.  Allergies  Allergen Reactions  . Iron Hives  . Vicodin [Hydrocodone-Acetaminophen] Other (See Comments)    Caused arrythmia   . Amiodarone Nausea And Vomiting  . Norvasc [Amlodipine Besylate] Nausea And Vomiting  . Promethazine Other (See Comments)    hallucinations  . Vioxx [Rofecoxib] Nausea And Vomiting    Family History  Problem Relation Age of Onset  . Other Mother        CVA  . Diabetes Mother   . Heart disease Mother        Heart disease before age 20  . Heart attack Mother   . Heart disease Father        Heart Disease before age 75  . Heart  attack Father   . Heart disease Sister        Amputation  . Diabetes Sister   . Heart disease Brother        Heart disease before age 15  . Diabetes Brother   . Stroke Brother   . Stroke Brother   . Heart disease Brother   . Stroke Brother   . Heart disease Brother   . Heart disease Sister   . Heart disease Sister   . Diabetes Daughter   . Cancer Sister   . Diabetes Other   . Cancer Other        breast  . Heart disease Other        cad  . Other Other        cva  . Colon cancer Neg Hx   . Esophageal cancer Neg Hx   . Inflammatory bowel disease Neg Hx   . Liver disease Neg Hx   . Pancreatic cancer Neg Hx   . Rectal cancer Neg Hx   . Stomach cancer Neg Hx     Prior to Admission medications   Medication Sig Start Date End Date Taking? Authorizing Provider  alendronate (FOSAMAX) 70 MG tablet Take 70 mg by mouth every Friday.  07/13/16  Yes [provider]  bimatoprost (LUMIGAN)  0.01 % SOLN Place 1 drop into both eyes at bedtime.   Yes [provider]  carvedilol (COREG) 12.5 MG tablet Take 1 tablet (12.5 mg total) by mouth 2 (two) times daily with a meal. 09/09/18  Yes Black, Lezlie Octave, NP  ezetimibe (ZETIA) 10 MG tablet Take 10 mg by mouth at bedtime.    Yes [provider]  metFORMIN (GLUCOPHAGE) 1000 MG tablet Take 1 tablet (1,000 mg total) by mouth 2 (two) times daily with a meal. 12/15/18  Yes Reed, Tiffany L, DO  nitroGLYCERIN (NITROSTAT) 0.4 MG SL tablet Place 1 tablet (0.4 mg total) under the tongue every 5 (five) minutes as needed for chest pain. 01/31/18  Yes Croitoru, Mihai, MD  rosuvastatin (CRESTOR) 40 MG tablet TAKE 1 TABLET DAILY FOR CHOLESTEROL Patient taking differently: Take 40 mg by mouth daily.  12/09/18  Yes Reed, Tiffany L, DO  glucose blood (ONETOUCH VERIO) test strip Use as instructed to test blood sugar twice daily DX: E11.29 09/04/18   [name], DO    Physical Exam: Vitals:   12/11/2018 1845 12/29/2018 1900 12/13/2018 1915 12/20/2018 1945  BP: 122/77 (!) 113/51 (!) 114/54 115/60  Pulse: 76 89 72 77  Resp: 19 (!) 23 (!) 27 (!) 29  Temp:      TempSrc:      SpO2: 93% 95% 93% 94%    Physical Exam  Constitutional: She is oriented to person, place, and time. No distress.  HENT:  Head: Normocephalic.  Dry mucous membranes  Eyes: Right eye exhibits no discharge. Left eye exhibits no discharge.  Neck: Neck supple.  Cardiovascular: Normal rate, regular rhythm and intact distal pulses.  Pulmonary/Chest: Effort normal and breath sounds normal. No respiratory distress. She has no wheezes. She has no rales.  Abdominal: Soft. Bowel sounds are normal. She exhibits no distension. There is abdominal tenderness. There is guarding. There is no rebound.  Right upper and lower quadrants tender to palpation with guarding  Musculoskeletal:        General: No edema.  Neurological: She is alert and oriented to person, place, and time.  Skin:  Skin is warm and dry. She is not  diaphoretic.  Psychiatric: She has a normal mood and affect. Her behavior is normal.     Labs on Admission: I have personally reviewed following labs and imaging studies  CBC: Recent Labs  Lab 12/20/2018 1708  WBC 16.4*  NEUTROABS 14.6*  HGB 9.9*  HCT 32.5*  MCV 89.3  PLT 128   Basic Metabolic Panel: Recent Labs  Lab 12/29/2018 1708  NA 132*  K 4.1  CL 101  CO2 19*  GLUCOSE 194*  BUN 45*  CREATININE 2.21*  CALCIUM 8.2*   GFR: Estimated Creatinine Clearance: 18 mL/min (A) (by C-G formula based on SCr of 2.21 mg/dL (H)). Liver Function Tests: Recent Labs  Lab 12/23/2018 1708  AST 38  ALT 17  ALKPHOS 99  BILITOT 0.9  PROT 6.6  ALBUMIN 2.8*   Recent Labs  Lab 01/01/2019 1708  LIPASE 41   No results for input(s): AMMONIA in the last 168 hours. Coagulation Profile: No results for input(s): INR, PROTIME in the last 168 hours. Cardiac Enzymes: No results for input(s): CKTOTAL, CKMB, CKMBINDEX, TROPONINI in the last 168 hours. BNP (last 3 results) No results for input(s): PROBNP in the last 8760 hours. HbA1C: No results for input(s): HGBA1C in the last 72 hours. CBG: Recent Labs  Lab 12/28/2018 1643  GLUCAP 167*   Lipid Profile: No results for input(s): CHOL, HDL, LDLCALC, TRIG, CHOLHDL, LDLDIRECT in the last 72 hours. Thyroid Function Tests: No results for input(s): TSH, T4TOTAL, FREET4, T3FREE, THYROIDAB in the last 72 hours. Anemia Panel: No results for input(s): VITAMINB12, FOLATE, FERRITIN, TIBC, IRON, RETICCTPCT in the last 72 hours. Urine analysis:    Component Value Date/Time   COLORURINE YELLOW 08/23/2018 1920   APPEARANCEUR CLEAR 08/23/2018 1920   LABSPEC 1.014 08/23/2018 1920   PHURINE 7.0 08/23/2018 1920   GLUCOSEU 50 (A) 08/23/2018 1920   HGBUR NEGATIVE 08/23/2018 1920   BILIRUBINUR NEGATIVE 08/23/2018 1920   BILIRUBINUR neg 07/18/2017 1118   KETONESUR NEGATIVE 08/23/2018 1920   PROTEINUR 100 (A) 08/23/2018  1920   UROBILINOGEN negative (A) 07/18/2017 1118   UROBILINOGEN 1.0 07/09/2014 0955   NITRITE NEGATIVE 08/23/2018 1920   LEUKOCYTESUR NEGATIVE 08/23/2018 1920    Radiological Exams on Admission: Ct Abdomen Pelvis Wo Contrast  Result Date: 12/10/2018 CLINICAL DATA:  Weakness and abdominal pain. EXAM: CT ABDOMEN AND PELVIS WITHOUT CONTRAST TECHNIQUE: Multidetector CT imaging of the abdomen and pelvis was performed following the standard protocol without IV contrast. COMPARISON:  CT 08/26/2018. Angiogram of the abdomen and pelvis PA and lateral chest today. FINDINGS: Lower chest: Dense airspace opacity is present in the right lower lobe. Left lung base is clear. Extensive calcific aortic and coronary atherosclerosis is noted. No pleural or pericardial effusion. Hepatobiliary: There is fatty infiltration of the liver. No focal lesion. The gallbladder and biliary tree appear normal. Pancreas: Unremarkable. No pancreatic ductal dilatation or surrounding inflammatory changes. Spleen: Normal in size without focal abnormality. Adrenals/Urinary Tract: Atrophy of the left kidney is unchanged. The right kidney appears normal. Ureters and urinary bladder are normal in appearance. The adrenal glands appear normal. Stomach/Bowel: The stomach and small and large bowel appear normal. Status post appendectomy. Vascular/Lymphatic: Extensive aortoiliac atherosclerosis is seen. 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 Bilateral renal artery stents are noted. 3.5 cm infrarenal abdominal aortic aneurysm is unchanged. Reproductive: Status post hysterectomy. No adnexal masses. Other: None. Musculoskeletal: Degenerative change about the hips and lower lumbar spine noted. IMPRESSION: Dense opacification in the right  lower lobe most consistent with pneumonia. No acute abnormality within the abdomen or pelvis. Fatty infiltration of the liver. Extensive calcific aortic and coronary  atherosclerosis. 3.5 cm infrarenal abdominal aortic aneurysm is unchanged. Recommend followup by ultrasound in 2 years. This recommendation follows ACR consensus guidelines: White Paper of the ACR Incidental Findings Committee II on Vascular Findings. J Am Coll Radiol 2013; 10:789-794. Aortic aneurysm NOS (ICD10-I71.9). Electronically Signed   By: Inge Rise M.D.   On: 12/16/2018 19:13   Dg Chest 2 View  Result Date: 12/24/2018 CLINICAL DATA:  Cough, fever EXAM: CHEST - 2 VIEW COMPARISON:  09/07/2018 chest CT 05/28/2018 FINDINGS: Prior CABG. Cardiomegaly. Tortuosity of the thoracic aorta with probable aneurysmal dilatation of the descending thoracic aorta as seen on prior chest CT. Right pleural effusion and right lower lobe airspace opacity concerning for pneumonia. IMPRESSION: Right lower lobe airspace opacity with right effusion. Findings suspicious for pneumonia. Cardiomegaly. Tortuous and aneurysmal aorta as seen on prior chest CT. Electronically Signed   By: Rolm Baptise M.D.   On: 12/26/2018 18:01    EKG: Independently reviewed.  Sinus rhythm, no significant change since prior tracing.  Assessment/Plan Principal Problem:   CAP (community acquired pneumonia) Active Problems:   DM (diabetes mellitus), type 2 with renal complications (HCC)   HTN (hypertension)   CKD (chronic kidney disease) stage 3, GFR 30-59 ml/min (HCC)   Sepsis (HCC)   Viral gastroenteritis   AKI (acute kidney injury) ([place])   Sepsis secondary to CAP -Presenting with 1 week history of shortness of breath, dry cough, fatigue, and fevers.   -Febrile with temperature 102.2 F and tachypneic.  Blood pressure as low as 82/50, now improved with IV fluid resuscitation (2 L boluses). Not hypoxic. -White count 16.4.  Lactic acid normal.  Influenza panel negative. -Imaging with evidence of right lower lobe pneumonia. -Continue IV fluid resuscitation -Ceftriaxone and azithromycin -Tylenol PRN -Tessalon Perles PRN  cough -Continue to monitor CBC -Blood culture x2 -UA, urine culture  Viral gastroenteritis -Presenting with a one-week history of right upper quadrant abdominal pain, nausea, and vomiting. -Lipase and LFTs normal.  CT abdomen pelvis without acute finding. -IV fluid -IV Zofran PRN nausea/ vomiting  -Tylenol PRN  AKI on CKD 3 Likely prerenal from dehydration.  BUN 45.  Creatinine 2.2, was 1.1 two months ago.  CT abdomen pelvis without evidence of nephrolithiasis or hydronephrosis.  Showing stable atrophy of left kidney. -IV fluid -Avoid nephrotoxic agents/contrast -Continue to monitor renal function -Monitor urine output  Type 2 diabetes -CBG 167. -Check A1c.  Sliding scale insulin sensitive with bedtime coverage.  CBG checks.  Chronic normocytic anemia -Hemoglobin 9.9, baseline ranging from 8.0-11.0.  No signs of active bleeding. -Continue to monitor  Paroxysmal atrial fibrillation -Currently in sinus rhythm.  CHA2DS2VASc 7. Patient is not on anticoagulation due to history of anemia and GI bleed.  Hold home Coreg at this time given soft blood pressure readings.  Hypertension -Hold home Coreg at this time  AAA CT showing 3.5 cm infrarenal abdominal aortic aneurysm which is unchanged. -Follow-up ultrasound recommended in 2 years.   DVT prophylaxis: Subcutaneous heparin Code Status: Patient wishes to be full code. Family Communication: Daughters and son at bedside. Disposition Plan: Anticipate discharge after clinical improvement. Consults called: None Admission status: Observation, telemetry   Shela Leff MD [facility] Pager [phone]  If 7PM-7AM, please contact night-coverage [email or web address] Password [email or web address]  12/11/2018, 8:20 PM

## 2018-12-30 NOTE — ED Notes (Signed)
Patient transported to X-ray 

## 2018-12-30 NOTE — ED Notes (Signed)
Pt reports she has been feeling sick with increasing weakness x 6 days. She also endorses n/v 2 days ago.  She went to see her doctor today for same, and was found to be febrile and very weak.  She is A&Ox 4, in NAD.  She reports diffuse abd pain.  No nausea or vomiting at this time.

## 2018-12-30 NOTE — ED Triage Notes (Addendum)
Pt was transported from [facility] for a routine check up, she was transported to the ED d/t weakness, fatigue, sob, generalized abd pain with n/v x 1 week.  Pt is A&O x 4.  She received 3/4 of 1000mg  of tylenol d/t nausea, and 4mg  of zofran.  Hx of CHF, lung sounds diminished on the Left per EMS.

## 2018-12-31 ENCOUNTER — Encounter: Payer: Medicare Other | Admitting: Family

## 2018-12-31 ENCOUNTER — Ambulatory Visit: Payer: Self-pay

## 2018-12-31 DIAGNOSIS — I361 Nonrheumatic tricuspid (valve) insufficiency: Secondary | ICD-10-CM | POA: Diagnosis not present

## 2018-12-31 DIAGNOSIS — N289 Disorder of kidney and ureter, unspecified: Secondary | ICD-10-CM | POA: Diagnosis not present

## 2018-12-31 DIAGNOSIS — R579 Shock, unspecified: Secondary | ICD-10-CM | POA: Diagnosis not present

## 2018-12-31 DIAGNOSIS — N179 Acute kidney failure, unspecified: Secondary | ICD-10-CM | POA: Diagnosis not present

## 2018-12-31 DIAGNOSIS — J181 Lobar pneumonia, unspecified organism: Secondary | ICD-10-CM | POA: Diagnosis not present

## 2018-12-31 DIAGNOSIS — I5033 Acute on chronic diastolic (congestive) heart failure: Secondary | ICD-10-CM | POA: Diagnosis not present

## 2018-12-31 DIAGNOSIS — I2510 Atherosclerotic heart disease of native coronary artery without angina pectoris: Secondary | ICD-10-CM | POA: Diagnosis present

## 2018-12-31 DIAGNOSIS — K7200 Acute and subacute hepatic failure without coma: Secondary | ICD-10-CM | POA: Diagnosis not present

## 2018-12-31 DIAGNOSIS — A084 Viral intestinal infection, unspecified: Secondary | ICD-10-CM | POA: Diagnosis present

## 2018-12-31 DIAGNOSIS — E871 Hypo-osmolality and hyponatremia: Secondary | ICD-10-CM | POA: Diagnosis present

## 2018-12-31 DIAGNOSIS — I712 Thoracic aortic aneurysm, without rupture: Secondary | ICD-10-CM | POA: Diagnosis present

## 2018-12-31 DIAGNOSIS — A419 Sepsis, unspecified organism: Secondary | ICD-10-CM | POA: Diagnosis present

## 2018-12-31 DIAGNOSIS — N170 Acute kidney failure with tubular necrosis: Secondary | ICD-10-CM | POA: Diagnosis not present

## 2018-12-31 DIAGNOSIS — J9601 Acute respiratory failure with hypoxia: Secondary | ICD-10-CM | POA: Diagnosis present

## 2018-12-31 DIAGNOSIS — D649 Anemia, unspecified: Secondary | ICD-10-CM | POA: Diagnosis present

## 2018-12-31 DIAGNOSIS — I480 Paroxysmal atrial fibrillation: Secondary | ICD-10-CM | POA: Diagnosis present

## 2018-12-31 DIAGNOSIS — E872 Acidosis: Secondary | ICD-10-CM | POA: Diagnosis not present

## 2018-12-31 DIAGNOSIS — E861 Hypovolemia: Secondary | ICD-10-CM | POA: Diagnosis present

## 2018-12-31 DIAGNOSIS — Z4682 Encounter for fitting and adjustment of non-vascular catheter: Secondary | ICD-10-CM | POA: Diagnosis not present

## 2018-12-31 DIAGNOSIS — D72829 Elevated white blood cell count, unspecified: Secondary | ICD-10-CM | POA: Diagnosis not present

## 2018-12-31 DIAGNOSIS — R7989 Other specified abnormal findings of blood chemistry: Secondary | ICD-10-CM | POA: Diagnosis not present

## 2018-12-31 DIAGNOSIS — I10 Essential (primary) hypertension: Secondary | ICD-10-CM | POA: Diagnosis not present

## 2018-12-31 DIAGNOSIS — K828 Other specified diseases of gallbladder: Secondary | ICD-10-CM | POA: Diagnosis not present

## 2018-12-31 DIAGNOSIS — N183 Chronic kidney disease, stage 3 (moderate): Secondary | ICD-10-CM

## 2018-12-31 DIAGNOSIS — J189 Pneumonia, unspecified organism: Secondary | ICD-10-CM | POA: Diagnosis present

## 2018-12-31 DIAGNOSIS — J9690 Respiratory failure, unspecified, unspecified whether with hypoxia or hypercapnia: Secondary | ICD-10-CM | POA: Diagnosis not present

## 2018-12-31 DIAGNOSIS — E1122 Type 2 diabetes mellitus with diabetic chronic kidney disease: Secondary | ICD-10-CM | POA: Diagnosis present

## 2018-12-31 DIAGNOSIS — R6521 Severe sepsis with septic shock: Secondary | ICD-10-CM | POA: Diagnosis not present

## 2018-12-31 DIAGNOSIS — Z66 Do not resuscitate: Secondary | ICD-10-CM | POA: Diagnosis not present

## 2018-12-31 DIAGNOSIS — E1151 Type 2 diabetes mellitus with diabetic peripheral angiopathy without gangrene: Secondary | ICD-10-CM | POA: Diagnosis present

## 2018-12-31 DIAGNOSIS — I130 Hypertensive heart and chronic kidney disease with heart failure and stage 1 through stage 4 chronic kidney disease, or unspecified chronic kidney disease: Secondary | ICD-10-CM | POA: Diagnosis present

## 2018-12-31 DIAGNOSIS — E875 Hyperkalemia: Secondary | ICD-10-CM | POA: Diagnosis present

## 2018-12-31 DIAGNOSIS — I252 Old myocardial infarction: Secondary | ICD-10-CM | POA: Diagnosis not present

## 2018-12-31 DIAGNOSIS — Z515 Encounter for palliative care: Secondary | ICD-10-CM | POA: Diagnosis not present

## 2018-12-31 DIAGNOSIS — G9341 Metabolic encephalopathy: Secondary | ICD-10-CM | POA: Diagnosis not present

## 2018-12-31 LAB — HEMOGLOBIN A1C
Hgb A1c MFr Bld: 5.8 % — ABNORMAL HIGH (ref 4.8–5.6)
Mean Plasma Glucose: 119.76 mg/dL

## 2018-12-31 LAB — URINALYSIS, ROUTINE W REFLEX MICROSCOPIC
Bilirubin Urine: NEGATIVE
Glucose, UA: NEGATIVE mg/dL
Ketones, ur: NEGATIVE mg/dL
Nitrite: NEGATIVE
PROTEIN: 100 mg/dL — AB
Specific Gravity, Urine: 1.023 (ref 1.005–1.030)
pH: 5 (ref 5.0–8.0)

## 2018-12-31 LAB — CBC
HCT: 28 % — ABNORMAL LOW (ref 36.0–46.0)
Hemoglobin: 8.7 g/dL — ABNORMAL LOW (ref 12.0–15.0)
MCH: 27.4 pg (ref 26.0–34.0)
MCHC: 31.1 g/dL (ref 30.0–36.0)
MCV: 88.3 fL (ref 80.0–100.0)
Platelets: 160 10*3/uL (ref 150–400)
RBC: 3.17 MIL/uL — ABNORMAL LOW (ref 3.87–5.11)
RDW: 15.4 % (ref 11.5–15.5)
WBC: 14.9 10*3/uL — ABNORMAL HIGH (ref 4.0–10.5)
nRBC: 0 % (ref 0.0–0.2)

## 2018-12-31 LAB — BASIC METABOLIC PANEL
Anion gap: 9 (ref 5–15)
BUN: 43 mg/dL — ABNORMAL HIGH (ref 8–23)
CO2: 21 mmol/L — ABNORMAL LOW (ref 22–32)
Calcium: 7.5 mg/dL — ABNORMAL LOW (ref 8.9–10.3)
Chloride: 105 mmol/L (ref 98–111)
Creatinine, Ser: 2.04 mg/dL — ABNORMAL HIGH (ref 0.44–1.00)
GFR calc Af Amer: 26 mL/min — ABNORMAL LOW (ref >60)
GFR, EST NON AFRICAN AMERICAN: 23 mL/min — AB (ref >60)
Glucose, Bld: 130 mg/dL — ABNORMAL HIGH (ref 70–99)
Potassium: 4.3 mmol/L (ref 3.5–5.1)
Sodium: 135 mmol/L (ref 135–145)

## 2018-12-31 LAB — GLUCOSE, CAPILLARY
Glucose-Capillary: 133 mg/dL — ABNORMAL HIGH (ref 70–99)
Glucose-Capillary: 132 mg/dL — ABNORMAL HIGH (ref 70–99)
Glucose-Capillary: 105 mg/dL — ABNORMAL HIGH (ref 70–99)
Glucose-Capillary: 128 mg/dL — ABNORMAL HIGH (ref 70–99)

## 2018-12-31 MED ORDER — IPRATROPIUM-ALBUTEROL 0.5-2.5 (3) MG/3ML IN SOLN
3.0000 mL | Freq: Four times a day (QID) | RESPIRATORY_TRACT | Status: DC
  Administered 2018-12-31: 3 mL via RESPIRATORY_TRACT
  Filled 2018-12-31: qty 3

## 2018-12-31 MED ORDER — PHENOL 1.4 % MT LIQD
1.0000 | OROMUCOSAL | Status: DC | PRN
  Administered 2018-12-31 – 2019-01-01 (×2): 1 via OROMUCOSAL
  Filled 2018-12-31: qty 177

## 2018-12-31 MED ORDER — SODIUM CHLORIDE 0.9 % IV SOLN: INTRAVENOUS | Status: AC

## 2018-12-31 NOTE — Progress Notes (Signed)
Patient assisted to side of bed on room air patient saturation 92% then transferred to bedside commode patient desat to 85% and was having labored breathing. Once assisted back to bed placed on one liter of oxygen, o2 sat increased to 92%. Productive sputum noted unable to assess color. Will continue to monitor.

## 2018-12-31 NOTE — Care Management Obs Status (Signed)
[place] NOTIFICATION   Patient Details  Name: HERMIE REAGOR MRN: [id number] Date of Birth: [birth year]   Medicare Observation Status Notification Given:  Yes    Carles Collet, RN 12/31/2018, 9:58 AM

## 2018-12-31 NOTE — Progress Notes (Signed)
Progress Note    Kendra Todd  MOQ:[id number] DOB: [birth year]  DOA: 12/22/2018 PCP: [name], DO    Brief Narrative:     Medical records reviewed and are as summarized below:  Kendra Todd is an 78 y.o. female with medical history significant of CAD, paroxysmal atrial fibrillation, hypertension, CHF, CKD, AAA presenting to the hospital with a one-week history of right upper quadrant abdominal pain, nausea, vomiting, fevers, generalized weakness/fatigue, shortness of breath, and dry cough.  She was found to have a pneumonia.  Assessment/Plan:   Principal Problem:   CAP (community acquired pneumonia) Active Problems:   DM (diabetes mellitus), type 2 with renal complications (HCC)   HTN (hypertension)   CKD (chronic kidney disease) stage 3, GFR 30-59 ml/min (HCC)   Sepsis (HCC)   Viral gastroenteritis   AKI (acute kidney injury) ([place])   Acute respiratory failure with hypoxia (HCC)  Sepsis present on admission secondary to CAP -Presenting with 1 week history of shortness of breath, dry cough, fatigue, and fevers.   -Febrile with temperature 102.2 F and tachypneic.  Blood pressure as low as 82/50, now improved with IV fluid resuscitation (2 L boluses) -White count 16.4.  Lactic acid normal.  Influenza panel negative. -Imaging with evidence of right lower lobe pneumonia. -Ceftriaxone and azithromycin -Tylenol PRN -Tessalon Perles PRN cough -Blood culture x2  Acute respiratory failure -due to PNA -wean off O2 as tolerated -add nebs  AKI on CKD 3 Likely prerenal from dehydration.  -CT abdomen pelvis without evidence of nephrolithiasis or hydronephrosis.  Showing stable atrophy of left kidney. -IV fluid -Avoid nephrotoxic agents/contrast -Continue to monitor renal function -Monitor urine output  Type 2 diabetes -CBG 167. -A1c: 5.8   -Sliding scale insulin sensitive with bedtime coverage.  CBG checks.  Chronic normocytic anemia - baseline ranging from  8.0-11.0.  No signs of active bleeding. -Continue to monitor  Paroxysmal atrial fibrillation -Currently in sinus rhythm.  CHA2DS2VASc 7. Patient is not on anticoagulation due to history of anemia and GI bleed.  Hold home Coreg at this time given soft blood pressure readings.  Hypertension -Hold home Coreg at this time  AAA CT showing 3.5 cm infrarenal abdominal aortic aneurysm which is unchanged. -Follow-up ultrasound recommended in 2 years.    Family Communication/Anticipated D/C date and plan/Code Status   DVT prophylaxis: heparin Code Status: Full Code.  Family Communication:  Disposition Plan: pending ability to wean off O2   Medical Consultants:    None.    Subjective:   Not feeling well  Objective:    Vitals:   12/28/2018 2338 12/31/18 0300 12/31/18 0450 12/31/18 1137  BP: 108/75  (!) 115/53   Pulse: 75  86   Resp: 18  18   Temp: 98.3 F (36.8 C) 100 F (37.8 C) 98.9 F (37.2 C) 99.3 F (37.4 C)  TempSrc: Oral Oral Oral Oral  SpO2: 98%  98% 95%  Weight:      Height:        Intake/Output Summary (Last 24 hours) at 12/31/2018 1144 Last data filed at 12/31/2018 1129 Gross per 24 hour  Intake 4084.57 ml  Output 200 ml  Net 3884.57 ml   Filed Weights   12/21/2018 2058  Weight: 63.7 kg    Exam: Ill appearing, frail Tachy to low 110s Wheezing in all fields +BS, soft No LE edmea  Data Reviewed:   I have personally reviewed following labs and imaging studies:  Labs: Labs show the  following:   Basic Metabolic Panel: Recent Labs  Lab 12/23/2018 1708 12/31/18 0401  NA 132* 135  K 4.1 4.3  CL 101 105  CO2 19* 21*  GLUCOSE 194* 130*  BUN 45* 43*  CREATININE 2.21* 2.04*  CALCIUM 8.2* 7.5*   GFR Estimated Creatinine Clearance: 19.6 mL/min (A) (by C-G formula based on SCr of 2.04 mg/dL (H)). Liver Function Tests: Recent Labs  Lab 12/13/2018 1708  AST 38  ALT 17  ALKPHOS 99  BILITOT 0.9  PROT 6.6  ALBUMIN 2.8*   Recent Labs  Lab  12/10/2018 1708  LIPASE 41   No results for input(s): AMMONIA in the last 168 hours. Coagulation profile No results for input(s): INR, PROTIME in the last 168 hours.  CBC: Recent Labs  Lab 12/23/2018 1708 12/31/18 0401  WBC 16.4* 14.9*  NEUTROABS 14.6*  --   HGB 9.9* 8.7*  HCT 32.5* 28.0*  MCV 89.3 88.3  PLT 189 160   Cardiac Enzymes: No results for input(s): CKTOTAL, CKMB, CKMBINDEX, TROPONINI in the last 168 hours. BNP (last 3 results) No results for input(s): PROBNP in the last 8760 hours. CBG: Recent Labs  Lab 12/19/2018 1643 12/07/2018 2110 12/31/18 0642 12/31/18 1117  GLUCAP 167* 171* 133* 132*   D-Dimer: No results for input(s): DDIMER in the last 72 hours. Hgb A1c: Recent Labs    12/31/18 0401  HGBA1C 5.8*   Lipid Profile: No results for input(s): CHOL, HDL, LDLCALC, TRIG, CHOLHDL, LDLDIRECT in the last 72 hours. Thyroid function studies: No results for input(s): TSH, T4TOTAL, T3FREE, THYROIDAB in the last 72 hours.  Invalid input(s): FREET3 Anemia work up: No results for input(s): VITAMINB12, FOLATE, FERRITIN, TIBC, IRON, RETICCTPCT in the last 72 hours. Sepsis Labs: Recent Labs  Lab 12/20/2018 1708 12/09/2018 1728 12/31/18 0401  WBC 16.4*  --  14.9*  LATICACIDVEN  --  1.8  --     Microbiology Recent Results (from the past 240 hour(s))  Blood Culture (routine x 2)     Status: None (Preliminary result)   Collection Time: 12/17/2018  5:28 PM  Result Value Ref Range Status   Specimen Description SITE NOT SPECIFIED  Final   Special Requests   Final    BOTTLES DRAWN AEROBIC AND ANAEROBIC Blood Culture adequate volume   Culture   Final    NO GROWTH < 24 HOURS Performed at [facility], [address], [place], [place] [address]    Report Status PENDING  Incomplete  Blood Culture (routine x 2)     Status: None (Preliminary result)   Collection Time: 12/25/2018  5:29 PM  Result Value Ref Range Status   Specimen Description SITE NOT SPECIFIED  Final    Special Requests   Final    BOTTLES DRAWN AEROBIC AND ANAEROBIC Blood Culture results may not be optimal due to an excessive volume of blood received in culture bottles   Culture   Final    NO GROWTH < 24 HOURS Performed at [facility] [address], [place],  [address]    Report Status PENDING  Incomplete    Procedures and diagnostic studies:  Ct Abdomen Pelvis Wo Contrast  Result Date: 12/14/2018 CLINICAL DATA:  Weakness and abdominal pain. EXAM: CT ABDOMEN AND PELVIS WITHOUT CONTRAST TECHNIQUE: Multidetector CT imaging of the abdomen and pelvis was performed following the standard protocol without IV contrast. COMPARISON:  CT 08/26/2018. Angiogram of the abdomen and pelvis PA and lateral chest today. FINDINGS: Lower chest:  Dense airspace opacity is present in the right lower lobe. Left lung base is clear. Extensive calcific aortic and coronary atherosclerosis is noted. No pleural or pericardial effusion. Hepatobiliary: There is fatty infiltration of the liver. No focal lesion. The gallbladder and biliary tree appear normal. Pancreas: Unremarkable. No pancreatic ductal dilatation or surrounding inflammatory changes. Spleen: Normal in size without focal abnormality. Adrenals/Urinary Tract: Atrophy of the left kidney is unchanged. The right kidney appears normal. Ureters and urinary bladder are normal in appearance. The adrenal glands appear normal. Stomach/Bowel: The stomach and small and large bowel appear normal. Status post appendectomy. Vascular/Lymphatic: Extensive aortoiliac atherosclerosis is seen. 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 Bilateral renal artery stents are noted. 3.5 cm infrarenal abdominal aortic aneurysm is unchanged. Reproductive: Status post hysterectomy. No adnexal masses. Other: None. Musculoskeletal: Degenerative change about the hips and lower lumbar spine noted. IMPRESSION: Dense opacification in the right lower  lobe most consistent with pneumonia. No acute abnormality within the abdomen or pelvis. Fatty infiltration of the liver. Extensive calcific aortic and coronary atherosclerosis. 3.5 cm infrarenal abdominal aortic aneurysm is unchanged. Recommend followup by ultrasound in 2 years. This recommendation follows ACR consensus guidelines: White Paper of the ACR Incidental Findings Committee II on Vascular Findings. J Am Coll Radiol 2013; 10:789-794. Aortic aneurysm NOS (ICD10-I71.9). Electronically Signed   By: Inge Rise M.D.   On: 12/15/2018 19:13   Dg Chest 2 View  Result Date: 12/24/2018 CLINICAL DATA:  Cough, fever EXAM: CHEST - 2 VIEW COMPARISON:  09/07/2018 chest CT 05/28/2018 FINDINGS: Prior CABG. Cardiomegaly. Tortuosity of the thoracic aorta with probable aneurysmal dilatation of the descending thoracic aorta as seen on prior chest CT. Right pleural effusion and right lower lobe airspace opacity concerning for pneumonia. IMPRESSION: Right lower lobe airspace opacity with right effusion. Findings suspicious for pneumonia. Cardiomegaly. Tortuous and aneurysmal aorta as seen on prior chest CT. Electronically Signed   By: Rolm Baptise M.D.   On: 12/11/2018 18:01    Medications:   . ezetimibe  10 mg Oral QHS  . heparin  5,000 Units Subcutaneous Q8H  . insulin aspart  0-5 Units Subcutaneous QHS  . insulin aspart  0-9 Units Subcutaneous TID WC  . latanoprost  1 drop Both Eyes QHS  . rosuvastatin  40 mg Oral QHS   Continuous Infusions: . sodium chloride 125 mL/hr at 12/31/18 1129  . azithromycin Stopped (12/06/2018 2054)  . cefTRIAXone (ROCEPHIN)  IV 200 mL/hr at 12/07/2018 2056     LOS: 0 days   Geradine Girt  [facility]   How to contact the [facility] Attending or Consulting provider [place] or covering provider during after hours [place], for this patient?  1. Check the care team in [facility] and look for a) attending/consulting [facility] provider listed and b) the [facility] team listed 2. Log into  [email or web address] and use [place]'s universal password to access. If you do not have the password, please contact the hospital operator. 3. Locate the [facility] provider you are looking for under [facility] and page to a number that you can be directly reached. 4. If you still have difficulty reaching the provider, please page the [facility] (Director on Call) for the Hospitalists listed on amion for assistance.  12/31/2018, 11:44 AM

## 2018-12-31 NOTE — Progress Notes (Signed)
While sleeping, patient oxygen saturation ranged between 87-91% on Room air. Pt placed on 1L of oxygen to increase oxygen saturation. Currently patient is 97-98% on 1L of oxygen.

## 2018-12-31 NOTE — Care Management (Signed)
Patient lives at home w daughter. Has RW, canes, states her home is handicapped ready including bathroom. PCP Dr Morton Peters. Patient states that she does not have home O2, is currently on supplemental oxygen. CM will continue to follow

## 2019-01-01 DIAGNOSIS — I10 Essential (primary) hypertension: Secondary | ICD-10-CM

## 2019-01-01 LAB — BASIC METABOLIC PANEL
Anion gap: 10 (ref 5–15)
BUN: 47 mg/dL — ABNORMAL HIGH (ref 8–23)
CO2: 21 mmol/L — ABNORMAL LOW (ref 22–32)
Calcium: 7.3 mg/dL — ABNORMAL LOW (ref 8.9–10.3)
Chloride: 106 mmol/L (ref 98–111)
Creatinine, Ser: 2.35 mg/dL — ABNORMAL HIGH (ref 0.44–1.00)
GFR calc Af Amer: 22 mL/min — ABNORMAL LOW (ref >60)
GFR calc non Af Amer: 19 mL/min — ABNORMAL LOW (ref >60)
GLUCOSE: 127 mg/dL — AB (ref 70–99)
Potassium: 4.4 mmol/L (ref 3.5–5.1)
Sodium: 137 mmol/L (ref 135–145)

## 2019-01-01 LAB — CBC
HCT: 28.7 % — ABNORMAL LOW (ref 36.0–46.0)
Hemoglobin: 8.7 g/dL — ABNORMAL LOW (ref 12.0–15.0)
MCH: 26.8 pg (ref 26.0–34.0)
MCHC: 30.3 g/dL (ref 30.0–36.0)
MCV: 88.3 fL (ref 80.0–100.0)
Platelets: 162 10*3/uL (ref 150–400)
RBC: 3.25 MIL/uL — ABNORMAL LOW (ref 3.87–5.11)
RDW: 15.8 % — ABNORMAL HIGH (ref 11.5–15.5)
WBC: 11.6 10*3/uL — ABNORMAL HIGH (ref 4.0–10.5)
nRBC: 0 % (ref 0.0–0.2)

## 2019-01-01 LAB — GLUCOSE, CAPILLARY
Glucose-Capillary: 110 mg/dL — ABNORMAL HIGH (ref 70–99)
Glucose-Capillary: 165 mg/dL — ABNORMAL HIGH (ref 70–99)
Glucose-Capillary: 98 mg/dL (ref 70–99)
Glucose-Capillary: 125 mg/dL — ABNORMAL HIGH (ref 70–99)

## 2019-01-01 LAB — CK: Total CK: 68 U/L (ref 38–234)

## 2019-01-01 LAB — URINE CULTURE

## 2019-01-01 MED ORDER — SODIUM CHLORIDE 0.9 % IV SOLN
INTRAVENOUS | Status: DC
  Administered 2019-01-01 – 2019-01-03 (×4): via INTRAVENOUS

## 2019-01-01 MED ORDER — IPRATROPIUM-ALBUTEROL 0.5-2.5 (3) MG/3ML IN SOLN
3.0000 mL | Freq: Three times a day (TID) | RESPIRATORY_TRACT | Status: DC
  Administered 2019-01-01 – 2019-01-07 (×18): 3 mL via RESPIRATORY_TRACT
  Filled 2019-01-01 (×17): qty 3

## 2019-01-01 NOTE — Progress Notes (Signed)
PROGRESS NOTE    Kendra Todd  ZDG:[id number] DOB: June 23, 1941 DOA: 12/17/2018 PCP: [name], DO    Brief Narrative:  78 year old female who presented with generalized weakness.  She does have significant past medical history for coronary artery disease, paroxysmal atrial fibrillation, hypertension, diastolic heart failure, chronic kidney disease and infrarenal aortic abdominal aneurysm.  Patient reported 1 week history of right upper quad abdominal pain, nausea, vomiting, fevers generalized weakness, associated with dyspnea and dry cough.  On her initial physical examination she was febrile 102.2 F, blood pressure 122/77, pulse rate 89, respiratory rate 29, oxygen saturation 94%.  She had dry mucous membranes, lungs with rales at the right base, abdomen soft, positive guarding, mild tenderness, no lower extremity edema.  Her chest x-ray had a right lower lobe opacity.  Patient was admitted to the hospital with the working diagnosis of sepsis due to right lower lobe community-acquired pneumonia.  Assessment & Plan:   Principal Problem:   CAP (community acquired pneumonia) Active Problems:   DM (diabetes mellitus), type 2 with renal complications (HCC)   HTN (hypertension)   CKD (chronic kidney disease) stage 3, GFR 30-59 ml/min (HCC)   Sepsis (HCC)   Viral gastroenteritis   AKI (acute kidney injury) ([place])   Acute respiratory failure with hypoxia ([place])   1. Sepsis due to right lower lobe community acquired pneumonia (present on admission)/ complicated with acute hypoxic respiratory failure. Continue antibiotic therapy with ceftriaxone and azithromycin, continue pain control with morphine. Cultures no growth, will check urine pneumococcal and legionella antigen. Oxymetry monitoring and supplemental 02 per [place].  2. HTN. Continue blood pressure monitoring, continue to hold on antihypertensive agents.   3. CKD 3 with [place]. Base renal function with serum cr 1,5, calculated GFR 38. Today  renal function with worsening GFR per cr, up to 2,35, K at 4,4 and serum bicarbonate at 21, will follow on renal panel in am, will add gentle IV fluids for hydration, patient clinically dry.   4, Diastolic heart failure. Stable with no signs of exacerbation, will add gentle IV fluids for now, continue telemetry monitoring.  5. T2DM. Continue glucose cover and monitoring with insulin sliding sclae, patient is tolerating po well.    DVT prophylaxis: enoxaparin   Code Status: full Family Communication: no family at the bedside Disposition Plan/ discharge barriers:  Pending clinical improvement,   Body mass index is 25.69 kg/m. Malnutrition Type:      Malnutrition Characteristics:      Nutrition Interventions:     RN Pressure Injury Documentation:     Consultants:     Procedures:     Antimicrobials:       Subjective: Patient continue feeling ill, poor appetite, chest pain and dyspnea, no nausea or vomiting, very weak and deconditioned.   Objective: Vitals:   12/31/18 2149 12/31/18 2331 01/01/19 0740 01/01/19 0838  BP: (!) 121/58 117/60 128/60   Pulse: 77 75 84   Resp: 18 20 20    Temp: 98.4 F (36.9 C) 98.3 F (36.8 C) 98.5 F (36.9 C)   TempSrc: Oral Oral Oral   SpO2: 100% 98% 98% 98%  Weight:      Height:        Intake/Output Summary (Last 24 hours) at 01/01/2019 1456 Last data filed at 01/01/2019 0000 Gross per 24 hour  Intake 1196.22 ml  Output 100 ml  Net 1096.22 ml   Filed Weights   12/17/2018 2058  Weight: 63.7 kg    Examination:  General: deconditioned and ill looking appearing.  Neurology: Awake and alert, non focal  E ENT: mild pallor, no icterus, oral mucosa moist Cardiovascular: No JVD. S1-S2 present, rhythmic, no gallops, rubs, or murmurs. No lower extremity edema. Pulmonary: decreased breath sounds at the right base, no wheezing or , rhonchi. But positive rales at the right lower lung.  Gastrointestinal. Abdomen mild distended  with no organomegaly, non tender, no rebound or guarding Skin. No rashes Musculoskeletal: no joint deformities     Data Reviewed: I have personally reviewed following labs and imaging studies  CBC: Recent Labs  Lab 12/26/2018 1708 12/31/18 0401 01/01/19 0335  WBC 16.4* 14.9* 11.6*  NEUTROABS 14.6*  --   --   HGB 9.9* 8.7* 8.7*  HCT 32.5* 28.0* 28.7*  MCV 89.3 88.3 88.3  PLT 189 160 355   Basic Metabolic Panel: Recent Labs  Lab 12/17/2018 1708 12/31/18 0401 01/01/19 0335  NA 132* 135 137  K 4.1 4.3 4.4  CL 101 105 106  CO2 19* 21* 21*  GLUCOSE 194* 130* 127*  BUN 45* 43* 47*  CREATININE 2.21* 2.04* 2.35*  CALCIUM 8.2* 7.5* 7.3*   GFR: Estimated Creatinine Clearance: 17 mL/min (A) (by C-G formula based on SCr of 2.35 mg/dL (H)). Liver Function Tests: Recent Labs  Lab 12/06/2018 1708  AST 38  ALT 17  ALKPHOS 99  BILITOT 0.9  PROT 6.6  ALBUMIN 2.8*   Recent Labs  Lab 12/26/2018 1708  LIPASE 41   No results for input(s): AMMONIA in the last 168 hours. Coagulation Profile: No results for input(s): INR, PROTIME in the last 168 hours. Cardiac Enzymes: Recent Labs  Lab 01/01/19 0335  CKTOTAL 68   BNP (last 3 results) No results for input(s): PROBNP in the last 8760 hours. HbA1C: Recent Labs    12/31/18 0401  HGBA1C 5.8*   CBG: Recent Labs  Lab 12/31/18 1117 12/31/18 1630 12/31/18 2117 01/01/19 0740 01/01/19 1135  GLUCAP 132* 105* 128* 110* 165*   Lipid Profile: No results for input(s): CHOL, HDL, LDLCALC, TRIG, CHOLHDL, LDLDIRECT in the last 72 hours. Thyroid Function Tests: No results for input(s): TSH, T4TOTAL, FREET4, T3FREE, THYROIDAB in the last 72 hours. Anemia Panel: No results for input(s): VITAMINB12, FOLATE, FERRITIN, TIBC, IRON, RETICCTPCT in the last 72 hours.    Radiology Studies: I have reviewed all of the imaging during this hospital visit personally     Scheduled Meds: . ezetimibe  10 mg Oral QHS  . heparin  5,000 Units  Subcutaneous Q8H  . insulin aspart  0-5 Units Subcutaneous QHS  . insulin aspart  0-9 Units Subcutaneous TID WC  . ipratropium-albuterol  3 mL Nebulization TID  . latanoprost  1 drop Both Eyes QHS  . rosuvastatin  40 mg Oral QHS   Continuous Infusions: . azithromycin Stopped (12/31/18 2107)  . cefTRIAXone (ROCEPHIN)  IV Stopped (12/31/18 1928)     LOS: 1 day        Mauricio Gerome Apley, MD

## 2019-01-02 LAB — BASIC METABOLIC PANEL
ANION GAP: 13 (ref 5–15)
BUN: 44 mg/dL — ABNORMAL HIGH (ref 8–23)
CO2: 16 mmol/L — ABNORMAL LOW (ref 22–32)
Calcium: 7 mg/dL — ABNORMAL LOW (ref 8.9–10.3)
Chloride: 106 mmol/L (ref 98–111)
Creatinine, Ser: 2.57 mg/dL — ABNORMAL HIGH (ref 0.44–1.00)
GFR calc Af Amer: 20 mL/min — ABNORMAL LOW (ref >60)
GFR calc non Af Amer: 17 mL/min — ABNORMAL LOW (ref >60)
Glucose, Bld: 146 mg/dL — ABNORMAL HIGH (ref 70–99)
Potassium: 3.7 mmol/L (ref 3.5–5.1)
Sodium: 135 mmol/L (ref 135–145)

## 2019-01-02 LAB — CBC WITH DIFFERENTIAL/PLATELET
Band Neutrophils: 0 %
Basophils Absolute: 0 10*3/uL (ref 0.0–0.1)
Basophils Relative: 0 %
Blasts: 0 %
Eosinophils Absolute: 0 10*3/uL (ref 0.0–0.5)
Eosinophils Relative: 0 %
HCT: 26.1 % — ABNORMAL LOW (ref 36.0–46.0)
Hemoglobin: 8.3 g/dL — ABNORMAL LOW (ref 12.0–15.0)
Lymphocytes Relative: 4 %
Lymphs Abs: 0.5 10*3/uL — ABNORMAL LOW (ref 0.7–4.0)
MCH: 27.9 pg (ref 26.0–34.0)
MCHC: 31.8 g/dL (ref 30.0–36.0)
MCV: 87.6 fL (ref 80.0–100.0)
MONO ABS: 1.1 10*3/uL — AB (ref 0.1–1.0)
Metamyelocytes Relative: 0 %
Monocytes Relative: 8 %
Myelocytes: 0 %
Neutro Abs: 11.6 10*3/uL — ABNORMAL HIGH (ref 1.7–7.7)
Neutrophils Relative %: 88 %
Other: 0 %
Platelets: 172 10*3/uL (ref 150–400)
Promyelocytes Relative: 0 %
RBC: 2.98 MIL/uL — ABNORMAL LOW (ref 3.87–5.11)
RDW: 16.4 % — ABNORMAL HIGH (ref 11.5–15.5)
WBC: 13.2 10*3/uL — ABNORMAL HIGH (ref 4.0–10.5)
nRBC: 0 /100 WBC
nRBC: 0.2 % (ref 0.0–0.2)

## 2019-01-02 LAB — GLUCOSE, CAPILLARY: Glucose-Capillary: 144 mg/dL — ABNORMAL HIGH (ref 70–99)

## 2019-01-02 MED ORDER — ENSURE ENLIVE PO LIQD
237.0000 mL | Freq: Two times a day (BID) | ORAL | Status: DC
  Administered 2019-01-03 – 2019-01-04 (×3): 237 mL via ORAL

## 2019-01-02 MED ORDER — METHOCARBAMOL 500 MG PO TABS
500.0000 mg | ORAL_TABLET | Freq: Once | ORAL | Status: AC
  Administered 2019-01-03: 500 mg via ORAL
  Filled 2019-01-02 (×2): qty 1

## 2019-01-02 MED ORDER — TRAMADOL HCL 50 MG PO TABS
50.0000 mg | ORAL_TABLET | Freq: Once | ORAL | Status: AC
  Administered 2019-01-02: 50 mg via ORAL
  Filled 2019-01-02: qty 1

## 2019-01-02 NOTE — Progress Notes (Addendum)
PROGRESS NOTE        PATIENT DETAILS Name: Kendra Todd Age: 78 y.o. Sex: female Date of Birth: 1941/04/18 Admit Date: 12/18/2018 Admitting Physician Shela Leff, MD WUJ:WJXB, Tiffany L, DO  Brief Narrative: Patient is a 78 y.o. female with past medical history of CAD, paroxysmal atrial fibrillation not on anticoagulation, hypertension, J4NW, chronic diastolic CHF, CKD stage III, and AAA without rupture who presented with chief complaint of worsening right upper quadrant abdominal pain, fever, dry cough, dyspnea, and generalized fatigue x 1 week. She initially thought her myalgias were secondary to her arthritis given the cold rainy whether. She was seen at her PCP on 01/02/2019 for fever, abdomina pain, nausea/vomiting, and shortness of breath and was sent to ED via EMS for further evaluation and treatment. In ED patient was febrile to 102 with BP of 101/56 and code sepsis was initiated. She was found to have right upper lobe pneumonia and started on IV antibiotics.   Subjective: Patient endorses mild improvement from yesterday. Ill appearing, persistent cough, shortness of breath, myalgias and weakness/fatigue making it difficult for her to sleep. She denies chest pain, nausea, vomiting, and diarrhea.      Assessment/Plan: Principal Problem:   CAP (community acquired pneumonia) Active Problems:   DM (diabetes mellitus), type 2 with renal complications (HCC)   HTN (hypertension)   CKD (chronic kidney disease) stage 3, GFR 30-59 ml/min (HCC)   Sepsis (HCC)   Viral gastroenteritis   AKI (acute kidney injury) ([place])   Acute respiratory failure with hypoxia ([place])   1. Community acquired pneumonia of right lower lung field, present on admission, complicated by acute hypoxic respiratory failure and sepsis.  -Afebrile, vital signs stable. Elevated white count to 13.2. Continue IV Ceftriaxone and IV Azithromycin  -Patient tolerating DuoNed well, plan to continue    -Continue benzonatate for cough  -O2 stable, >92% on 2L. Continue supplemental O2 per [place]  2. T2DM -Continue insulin tid with meals with sliding scale and PO ezetimibe 10 mg at bedtime  -Continue CBG monitoring   3. HTN -BP 113/54 this AM, continue to hold antihypertensive and monitor BP   4. AKI on CKD stage 3 -Worsening GFR per Cr elevated at 2.57, serum bicarb 16. K improved to 3.7. Will continue to monitor renal function with daily renal panels.  -Avoid nephrotoxic agents -Routine I&Os    Echo (reviewed):EF 60-65% on TTE done on 02/03/18  Morning labs/Imaging ordered: yes  DVT Prophylaxis: Prophylactic Heparin   Code Status: Full code  Family Communication: None  Disposition Plan: Remain inpatient pending clinical improvement   Antimicrobial agents: Anti-infectives (From admission, onward)   Start     Dose/Rate Route Frequency Ordered Stop   12/07/2018 1830  cefTRIAXone (ROCEPHIN) 2 g in sodium chloride 0.9 % 100 mL IVPB     2 g 200 mL/hr over 30 Minutes Intravenous Every 24 hours 12/28/2018 1819     12/20/2018 1830  azithromycin (ZITHROMAX) 500 mg in sodium chloride 0.9 % 250 mL IVPB     500 mg 250 mL/hr over 60 Minutes Intravenous Every 24 hours 12/06/2018 1819        MEDICATIONS: Scheduled Meds: . ezetimibe  10 mg Oral QHS  . heparin  5,000 Units Subcutaneous Q8H  . insulin aspart  0-5 Units Subcutaneous QHS  . insulin aspart  0-9 Units Subcutaneous TID WC  .  ipratropium-albuterol  3 mL Nebulization TID  . latanoprost  1 drop Both Eyes QHS  . rosuvastatin  40 mg Oral QHS   Continuous Infusions: . sodium chloride 75 mL/hr at 01/02/19 0446  . azithromycin 250 mL/hr at 01/02/19 0446  . cefTRIAXone (ROCEPHIN)  IV Stopped (01/01/19 1856)   PRN Meds:.acetaminophen **OR** acetaminophen, benzonatate, ondansetron (ZOFRAN) IV, phenol   PHYSICAL EXAM: Vital signs: Vitals:   01/01/19 1632 01/01/19 1948 01/01/19 2320 01/02/19 0748  BP: 138/66  122/69 (!) 113/54   Pulse: 95  91 79  Resp: 16  16 17   Temp: 98.7 F (37.1 C)  98.9 F (37.2 C) 97.8 F (36.6 C)  TempSrc: Oral  Oral Oral  SpO2: 98% 95% 98% 99%  Weight:      Height:       Filed Weights   12/06/2018 2058  Weight: 63.7 kg   Body mass index is 25.69 kg/m.   General: Awake, alert. Appears fatigued  HEENT: Dry mucous membranes. No scleral icterus. Neck: Supple, no JVD. No cervical LAD. CV: Regular rate and rhythm, no murmurs, rubs, or gallops. No LE edema bilaterally.  Pulm: Rales over the right lower lobe. Normal respiratory effort. No wheezing.  GI: Non tender and not distended  Extremities: Both legs are warm to touch Musculoskeletal: No visible joint deformities  Skin: No Rash, warm and dry  I have personally reviewed following labs and imaging studies  LABORATORY DATA: CBC: Recent Labs  Lab 12/11/2018 1708 12/31/18 0401 01/01/19 0335 01/02/19 0311  WBC 16.4* 14.9* 11.6* 13.2*  NEUTROABS 14.6*  --   --  11.6*  HGB 9.9* 8.7* 8.7* 8.3*  HCT 32.5* 28.0* 28.7* 26.1*  MCV 89.3 88.3 88.3 87.6  PLT 189 160 162 921    Basic Metabolic Panel: Recent Labs  Lab 12/19/2018 1708 12/31/18 0401 01/01/19 0335 01/02/19 0311  NA 132* 135 137 135  K 4.1 4.3 4.4 3.7  CL 101 105 106 106  CO2 19* 21* 21* 16*  GLUCOSE 194* 130* 127* 146*  BUN 45* 43* 47* 44*  CREATININE 2.21* 2.04* 2.35* 2.57*  CALCIUM 8.2* 7.5* 7.3* 7.0*    GFR: Estimated Creatinine Clearance: 15.6 mL/min (A) (by C-G formula based on SCr of 2.57 mg/dL (H)).  Liver Function Tests: Recent Labs  Lab 12/10/2018 1708  AST 38  ALT 17  ALKPHOS 99  BILITOT 0.9  PROT 6.6  ALBUMIN 2.8*   Recent Labs  Lab 12/16/2018 1708  LIPASE 41   No results for input(s): AMMONIA in the last 168 hours.  Coagulation Profile: No results for input(s): INR, PROTIME in the last 168 hours.  Cardiac Enzymes: Recent Labs  Lab 01/01/19 0335  CKTOTAL 68    BNP (last 3 results) No results for input(s): PROBNP in the last  8760 hours.  HbA1C: Recent Labs    12/31/18 0401  HGBA1C 5.8*    CBG: Recent Labs  Lab 12/31/18 2117 01/01/19 0740 01/01/19 1135 01/01/19 1633 01/01/19 2115  GLUCAP 128* 110* 165* 98 125*    Lipid Profile: No results for input(s): CHOL, HDL, LDLCALC, TRIG, CHOLHDL, LDLDIRECT in the last 72 hours.  Thyroid Function Tests: No results for input(s): TSH, T4TOTAL, FREET4, T3FREE, THYROIDAB in the last 72 hours.  Anemia Panel: No results for input(s): VITAMINB12, FOLATE, FERRITIN, TIBC, IRON, RETICCTPCT in the last 72 hours.  Urine analysis:    Component Value Date/Time   COLORURINE AMBER (A) 12/31/2018 0234   APPEARANCEUR CLOUDY (A) 12/31/2018 0234  LABSPEC 1.023 12/31/2018 0234   PHURINE 5.0 12/31/2018 0234   GLUCOSEU NEGATIVE 12/31/2018 0234   HGBUR MODERATE (A) 12/31/2018 0234   BILIRUBINUR NEGATIVE 12/31/2018 0234   BILIRUBINUR neg 07/18/2017 1118   KETONESUR NEGATIVE 12/31/2018 0234   PROTEINUR 100 (A) 12/31/2018 0234   UROBILINOGEN negative (A) 07/18/2017 1118   UROBILINOGEN 1.0 07/09/2014 0955   NITRITE NEGATIVE 12/31/2018 0234   LEUKOCYTESUR TRACE (A) 12/31/2018 0234    Sepsis Labs: Lactic Acid, Venous    Component Value Date/Time   LATICACIDVEN 1.8 12/26/2018 1728    MICROBIOLOGY: Recent Results (from the past 240 hour(s))  Blood Culture (routine x 2)     Status: None (Preliminary result)   Collection Time: 12/13/2018  5:28 PM  Result Value Ref Range Status   Specimen Description SITE NOT SPECIFIED  Final   Special Requests   Final    BOTTLES DRAWN AEROBIC AND ANAEROBIC Blood Culture adequate volume Performed at [facility], [place] [address], [place], [place] [address]    Culture NO GROWTH 3 DAYS  Final   Report Status PENDING  Incomplete  Blood Culture (routine x 2)     Status: None (Preliminary result)   Collection Time: 12/13/2018  5:29 PM  Result Value Ref Range Status   Specimen Description SITE NOT SPECIFIED  Final   Special Requests    Final    BOTTLES DRAWN AEROBIC AND ANAEROBIC Blood Culture results may not be optimal due to an excessive volume of blood received in culture bottles Performed at [facility], [place] [address], [place], [place] [address]    Culture NO GROWTH 3 DAYS  Final   Report Status PENDING  Incomplete  Urine culture     Status: Abnormal   Collection Time: 12/31/18  2:33 AM  Result Value Ref Range Status   Specimen Description   Final    URINE, CLEAN CATCH Performed at [facility], [place] [address] [place], [place] [address]    Special Requests NONE  Final   Culture MULTIPLE SPECIES PRESENT, SUGGEST RECOLLECTION (A)  Final   Report Status 01/01/2019 FINAL  Final    RADIOLOGY STUDIES/RESULTS: Ct Abdomen Pelvis Wo Contrast  Result Date: 12/19/2018 CLINICAL DATA:  Weakness and abdominal pain. EXAM: CT ABDOMEN AND PELVIS WITHOUT CONTRAST TECHNIQUE: Multidetector CT imaging of the abdomen and pelvis was performed following the standard protocol without IV contrast. COMPARISON:  CT 08/26/2018. Angiogram of the abdomen and pelvis PA and lateral chest today. FINDINGS: Lower chest: Dense airspace opacity is present in the right lower lobe. Left lung base is clear. Extensive calcific aortic and coronary atherosclerosis is noted. No pleural or pericardial effusion. Hepatobiliary: There is fatty infiltration of the liver. No focal lesion. The gallbladder and biliary tree appear normal. Pancreas: Unremarkable. No pancreatic ductal dilatation or surrounding inflammatory changes. Spleen: Normal in size without focal abnormality. Adrenals/Urinary Tract: Atrophy of the left kidney is unchanged. The right kidney appears normal. Ureters and urinary bladder are normal in appearance. The adrenal glands appear normal. Stomach/Bowel: The stomach and small and large bowel appear normal. Status post appendectomy. Vascular/Lymphatic: Extensive aortoiliac atherosclerosis is seen. 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 Bilateral renal artery stents are noted. 3.5 cm infrarenal abdominal aortic aneurysm is unchanged. Reproductive: Status post hysterectomy. No adnexal masses. Other: None. Musculoskeletal: Degenerative change about the hips and lower lumbar spine noted. IMPRESSION: Dense opacification in the right lower lobe most consistent with  pneumonia. No acute abnormality within the abdomen or pelvis. Fatty infiltration of the liver. Extensive calcific aortic and coronary atherosclerosis. 3.5 cm infrarenal abdominal aortic aneurysm is unchanged. Recommend followup by ultrasound in 2 years. This recommendation follows ACR consensus guidelines: White Paper of the ACR Incidental Findings Committee II on Vascular Findings. J Am Coll Radiol 2013; 10:789-794. Aortic aneurysm NOS (ICD10-I71.9). Electronically Signed   By: Inge Rise M.D.   On: 12/09/2018 19:13   Dg Chest 2 View  Result Date: 12/24/2018 CLINICAL DATA:  Cough, fever EXAM: CHEST - 2 VIEW COMPARISON:  09/07/2018 chest CT 05/28/2018 FINDINGS: Prior CABG. Cardiomegaly. Tortuosity of the thoracic aorta with probable aneurysmal dilatation of the descending thoracic aorta as seen on prior chest CT. Right pleural effusion and right lower lobe airspace opacity concerning for pneumonia. IMPRESSION: Right lower lobe airspace opacity with right effusion. Findings suspicious for pneumonia. Cardiomegaly. Tortuous and aneurysmal aorta as seen on prior chest CT. Electronically Signed   By: Rolm Baptise M.D.   On: 12/14/2018 18:01     LOS: 2 days   Deboraha Sprang, PA-S  01/02/2019, 9:52 AM

## 2019-01-02 NOTE — Care Management Important Message (Signed)
Important Message  Patient Details  Name: Kendra Todd MRN: [id number] Date of Birth: 06/08/1941   Medicare Important Message Given:  Yes    Kendra Todd 01/02/2019, 3:54 PM

## 2019-01-02 NOTE — Progress Notes (Signed)
PROGRESS NOTE    ILLYANNA Todd  JJK:[id number] DOB: 04-Apr-1941 DOA: 12/10/2018 PCP: [name], DO    Brief Narrative:  78 year old female who presented with generalized weakness.  She does have significant past medical history for coronary artery disease, paroxysmal atrial fibrillation, hypertension, diastolic heart failure, chronic kidney disease and infrarenal aortic abdominal aneurysm.  Patient reported 1 week history of right upper quad abdominal pain, nausea, vomiting, fevers generalized weakness, associated with dyspnea and dry cough.  On her initial physical examination she was febrile 102.2 F, blood pressure 122/77, pulse rate 89, respiratory rate 29, oxygen saturation 94%.  She had dry mucous membranes, lungs with rales at the right base, abdomen soft, positive guarding, mild tenderness, no lower extremity edema.  Her chest x-ray had a right lower lobe opacity.  Patient was admitted to the hospital with the working diagnosis of sepsis due to right lower lobe community-acquired pneumonia.   Assessment & Plan:   Principal Problem:   CAP (community acquired pneumonia) Active Problems:   DM (diabetes mellitus), type 2 with renal complications (HCC)   HTN (hypertension)   CKD (chronic kidney disease) stage 3, GFR 30-59 ml/min (HCC)   Sepsis (HCC)   Viral gastroenteritis   AKI (acute kidney injury) ([place])   Acute respiratory failure with hypoxia ([place])   1. Sepsis due to right lower lobe community acquired pneumonia (present on admission)/ complicated with acute hypoxic respiratory failure. Antibiotic therapy with IV ceftriaxone and azithromycin, improved pain control with morphine. Out of bed as tolerated. Wbc worsening, will follow chest film in am, rule out development of pleural effusion.   2. HTN.  Stable blood pressure.  3. CKD 3 with [place]ki. Continue gentle hydration with isotonic saline, patient continue clinically dry, renal function with serum cr down to 2,57 with K at  3,7, will follow on renal panel in am. Avoid hypotension and nephrotoxic medications.   4, Diastolic heart failure. Tolerating well IV fluids, continue close monitoring of volume status.  5. T2DM. Glucose cover and monitoring with insulin sliding sclae, patient is tolerating po well. Fasting glucose 146.    DVT prophylaxis: enoxaparin   Code Status: full Family Communication: no family at the bedside Disposition Plan/ discharge barriers:  Pending clinical improvement,    Body mass index is 25.69 kg/m. Malnutrition Type:      Malnutrition Characteristics:      Nutrition Interventions:     RN Pressure Injury Documentation:     Consultants:     Procedures:     Antimicrobials:       Subjective: Patient continue to have dyspnea and chest pain, very weak and deconditioned, has not been able to sleep well last night. No chest pain, no nausea or vomiting.   Objective: Vitals:   01/01/19 1948 01/01/19 2320 01/02/19 0748 01/02/19 1324  BP:  122/69 (!) 113/54   Pulse:  91 79 85  Resp:  16 17 18   Temp:  98.9 F (37.2 C) 97.8 F (36.6 C)   TempSrc:  Oral Oral   SpO2: 95% 98% 99% 98%  Weight:      Height:        Intake/Output Summary (Last 24 hours) at 01/02/2019 1439 Last data filed at 01/02/2019 0800 Gross per 24 hour  Intake 1849.75 ml  Output 300 ml  Net 1549.75 ml   Filed Weights   01/01/2019 2058  Weight: 63.7 kg    Examination:   General: deconditioned and ill looking appearing  Neurology: Awake and  alert, non focal  E ENT: positive pallor, no icterus, oral mucosa moist Cardiovascular: No JVD. S1-S2 present, rhythmic, no gallops, rubs, or murmurs. trace lower extremity edema. Pulmonary: decreased breath sounds at the right base, with adequate air movement, no significant wheezing, or rhonchi. Right base rales. Gastrointestinal. Abdomen with no organomegaly, non tender, no rebound or guarding Skin. No rashes Musculoskeletal: no joint  deformities     Data Reviewed: I have personally reviewed following labs and imaging studies  CBC: Recent Labs  Lab 12/23/2018 1708 12/31/18 0401 01/01/19 0335 01/02/19 0311  WBC 16.4* 14.9* 11.6* 13.2*  NEUTROABS 14.6*  --   --  11.6*  HGB 9.9* 8.7* 8.7* 8.3*  HCT 32.5* 28.0* 28.7* 26.1*  MCV 89.3 88.3 88.3 87.6  PLT 189 160 162 220   Basic Metabolic Panel: Recent Labs  Lab 12/11/2018 1708 12/31/18 0401 01/01/19 0335 01/02/19 0311  NA 132* 135 137 135  K 4.1 4.3 4.4 3.7  CL 101 105 106 106  CO2 19* 21* 21* 16*  GLUCOSE 194* 130* 127* 146*  BUN 45* 43* 47* 44*  CREATININE 2.21* 2.04* 2.35* 2.57*  CALCIUM 8.2* 7.5* 7.3* 7.0*   GFR: Estimated Creatinine Clearance: 15.6 mL/min (A) (by C-G formula based on SCr of 2.57 mg/dL (H)). Liver Function Tests: Recent Labs  Lab 12/24/2018 1708  AST 38  ALT 17  ALKPHOS 99  BILITOT 0.9  PROT 6.6  ALBUMIN 2.8*   Recent Labs  Lab 12/10/2018 1708  LIPASE 41   No results for input(s): AMMONIA in the last 168 hours. Coagulation Profile: No results for input(s): INR, PROTIME in the last 168 hours. Cardiac Enzymes: Recent Labs  Lab 01/01/19 0335  CKTOTAL 68   BNP (last 3 results) No results for input(s): PROBNP in the last 8760 hours. HbA1C: Recent Labs    12/31/18 0401  HGBA1C 5.8*   CBG: Recent Labs  Lab 12/31/18 2117 01/01/19 0740 01/01/19 1135 01/01/19 1633 01/01/19 2115  GLUCAP 128* 110* 165* 98 125*   Lipid Profile: No results for input(s): CHOL, HDL, LDLCALC, TRIG, CHOLHDL, LDLDIRECT in the last 72 hours. Thyroid Function Tests: No results for input(s): TSH, T4TOTAL, FREET4, T3FREE, THYROIDAB in the last 72 hours. Anemia Panel: No results for input(s): VITAMINB12, FOLATE, FERRITIN, TIBC, IRON, RETICCTPCT in the last 72 hours.    Radiology Studies: I have reviewed all of the imaging during this hospital visit personally     Scheduled Meds: . ezetimibe  10 mg Oral QHS  . heparin  5,000 Units  Subcutaneous Q8H  . insulin aspart  0-5 Units Subcutaneous QHS  . insulin aspart  0-9 Units Subcutaneous TID WC  . ipratropium-albuterol  3 mL Nebulization TID  . latanoprost  1 drop Both Eyes QHS  . rosuvastatin  40 mg Oral QHS   Continuous Infusions: . sodium chloride 75 mL/hr at 01/02/19 0446  . azithromycin 250 mL/hr at 01/02/19 0446  . cefTRIAXone (ROCEPHIN)  IV Stopped (01/01/19 1856)     LOS: 2 days        Mauricio Gerome Apley, MD

## 2019-01-03 ENCOUNTER — Inpatient Hospital Stay (HOSPITAL_COMMUNITY): Payer: Medicare Other

## 2019-01-03 DIAGNOSIS — A419 Sepsis, unspecified organism: Principal | ICD-10-CM

## 2019-01-03 LAB — CBC WITH DIFFERENTIAL/PLATELET
Band Neutrophils: 0 %
Basophils Absolute: 0 10*3/uL (ref 0.0–0.1)
Basophils Relative: 0 %
Blasts: 0 %
Eosinophils Absolute: 0.2 10*3/uL (ref 0.0–0.5)
Eosinophils Relative: 1 %
HCT: 26.5 % — ABNORMAL LOW (ref 36.0–46.0)
Hemoglobin: 8.3 g/dL — ABNORMAL LOW (ref 12.0–15.0)
Lymphocytes Relative: 3 %
Lymphs Abs: 0.6 10*3/uL — ABNORMAL LOW (ref 0.7–4.0)
MCH: 27.6 pg (ref 26.0–34.0)
MCHC: 31.3 g/dL (ref 30.0–36.0)
MCV: 88 fL (ref 80.0–100.0)
Metamyelocytes Relative: 0 %
Monocytes Absolute: 1.1 10*3/uL — ABNORMAL HIGH (ref 0.1–1.0)
Monocytes Relative: 6 %
Myelocytes: 0 %
NEUTROS PCT: 90 %
Neutro Abs: 16.8 10*3/uL — ABNORMAL HIGH (ref 1.7–7.7)
Other: 0 %
Platelets: 183 10*3/uL (ref 150–400)
Promyelocytes Relative: 0 %
RBC: 3.01 MIL/uL — ABNORMAL LOW (ref 3.87–5.11)
RDW: 16.7 % — ABNORMAL HIGH (ref 11.5–15.5)
WBC: 18.7 10*3/uL — ABNORMAL HIGH (ref 4.0–10.5)
nRBC: 0 % (ref 0.0–0.2)
nRBC: 0 /100 WBC

## 2019-01-03 LAB — BASIC METABOLIC PANEL
Anion gap: 10 (ref 5–15)
BUN: 47 mg/dL — ABNORMAL HIGH (ref 8–23)
CO2: 18 mmol/L — ABNORMAL LOW (ref 22–32)
CREATININE: 3.09 mg/dL — AB (ref 0.44–1.00)
Calcium: 7.1 mg/dL — ABNORMAL LOW (ref 8.9–10.3)
Chloride: 108 mmol/L (ref 98–111)
GFR calc non Af Amer: 14 mL/min — ABNORMAL LOW (ref >60)
GFR, EST AFRICAN AMERICAN: 16 mL/min — AB (ref >60)
Glucose, Bld: 126 mg/dL — ABNORMAL HIGH (ref 70–99)
Potassium: 4 mmol/L (ref 3.5–5.1)
Sodium: 136 mmol/L (ref 135–145)

## 2019-01-03 LAB — GLUCOSE, CAPILLARY
GLUCOSE-CAPILLARY: 84 mg/dL (ref 70–99)
Glucose-Capillary: 133 mg/dL — ABNORMAL HIGH (ref 70–99)
Glucose-Capillary: 170 mg/dL — ABNORMAL HIGH (ref 70–99)
Glucose-Capillary: 97 mg/dL (ref 70–99)

## 2019-01-03 MED ORDER — TRAMADOL HCL 50 MG PO TABS
50.0000 mg | ORAL_TABLET | Freq: Once | ORAL | Status: AC
  Administered 2019-01-03: 50 mg via ORAL
  Filled 2019-01-03: qty 1

## 2019-01-03 MED ORDER — SODIUM CHLORIDE 0.9 % IV BOLUS
500.0000 mL | Freq: Once | INTRAVENOUS | Status: AC
  Administered 2019-01-03: 500 mL via INTRAVENOUS

## 2019-01-03 NOTE — Progress Notes (Signed)
Initial Nutrition Assessment  DOCUMENTATION CODES:   Not applicable  INTERVENTION:   Ensure Enlive po BID, each supplement provides 350 kcal and 20 grams of protein  NUTRITION DIAGNOSIS:   Inadequate oral intake related to acute illness as evidenced by meal completion < 25%, per patient/family report.  GOAL:   Patient will meet greater than or equal to 90% of their needs  MONITOR:   PO intake, Supplement acceptance, Labs, Weight trends  REASON FOR ASSESSMENT:   Consult Assessment of nutrition requirement/status  ASSESSMENT:    78 yo female admitted with sepsis due to RLL CAP with acute respiratory failure, AKI on CKD III. PMH includes DM, HTN, CHF   Pt does not feel well on visit today; reports she is tired and has been awake since 3:30 AM. Pt rolled over on left side and does not participate much in exam. Pt reports appetite is poor, not eating much presently. Recorded po intake 10-25% of meals. Pt reports she drinks Ensure at home, 2x daily usually. Unable to get very good diet history from pt  Pt reports UBW around 137-139 pounds. Current wt 140 pounds (63.7 kg). Weights relatively stable recently per weight encounters.   Labs: CBGs 98-165, Creatinine 3.09, BUN 47 Meds: NS at 100 ml/hr   NUTRITION - FOCUSED PHYSICAL EXAM: Difficult exam as pt lying on left side and did not want to roll over    Most Recent Value  Orbital Region  No depletion  Upper Arm Region  No depletion  Thoracic and Lumbar Region  No depletion  Buccal Region  No depletion  Temple Region  No depletion  Clavicle Bone Region  No depletion  Clavicle and Acromion Bone Region  No depletion  Scapular Bone Region  No depletion  Dorsal Hand  No depletion  Patellar Region  No depletion  Anterior Thigh Region  No depletion  Posterior Calf Region  No depletion  Edema (RD Assessment)  None       Diet Order:   Diet Order            Diet Carb Modified Fluid consistency: Thin; Room service  appropriate? Yes  Diet effective now              EDUCATION NEEDS:   Education needs have been addressed  Skin:  Skin Assessment: Reviewed RN Assessment  Last BM:  2/27  Height:   Ht Readings from Last 1 Encounters:  12/28/2018 5\' 2"  (1.575 m)    Weight:   Wt Readings from Last 1 Encounters:  12/11/2018 63.7 kg    Ideal Body Weight:  50 kg  BMI:  Body mass index is 25.69 kg/m.  Estimated Nutritional Needs:   Kcal:  1550-1750 kcals   Protein:  78-88 g   Fluid:  >/= 1.6 L  Kerman Passey MS, RD, LDN, CNSC [phone] Pager  [phone] Weekend/On-Call Pager

## 2019-01-03 NOTE — Progress Notes (Signed)
PROGRESS NOTE    Kendra Todd  FOY:[id number] DOB: 11-30-1940 DOA: 12/29/2018 PCP: [name], DO    Brief Narrative:  78 year old female who presented with generalized weakness. She does have significant past medical history for coronary artery disease, paroxysmal atrial fibrillation, hypertension, diastolic heart failure, chronic kidney disease and infrarenal aortic abdominal aneurysm. Patient reported 1 week history of right upper quad abdominal pain, nausea, vomiting, fevers generalized weakness, associated with dyspnea and dry cough. On her initial physical examination she was febrile 102.2 F, blood pressure 122/77, pulse rate 89, respiratory rate29, oxygen saturation 94%.She had dry mucous membranes, lungs with rales at the right base, abdomen soft, positive guarding, mild tenderness, no lower extremity edema. Her chest x-ray had a right lower lobe opacity.  Patient was admitted to the hospitalwith theworking diagnosis of sepsis due to right lower lobe community-acquired pneumonia.    Assessment & Plan:   Principal Problem:   CAP (community acquired pneumonia) Active Problems:   DM (diabetes mellitus), type 2 with renal complications (HCC)   HTN (hypertension)   CKD (chronic kidney disease) stage 3, GFR 30-59 ml/min (HCC)   Sepsis (HCC)   Viral gastroenteritis   AKI (acute kidney injury) ([place])   Acute respiratory failure with hypoxia ([place])   1. Sepsis due to right lower lobe community acquired pneumonia (present on admission)/ complicated with acute hypoxic respiratory failure. Patient with persistent pain on the right side, pleuritic, radiated to the neck, [place] with no significant pleural fluid for thoracentesis. Noted worsening leukocytosis, will continue with IV ceftriaxone and azithromycin, for now. If no improvement in 24 will do chest CT with non contrast. Oxygen saturation 99% on 2 LPM [place].   2. HTN.  Continue to hold on antihypertensive agents, blood pressure  systolic 767 mmHg  3. CKD 3 with [place] and non gap metabolic acidosis. Worsening renal function with serum cr up to 3,0 with K at 4,0 and serum bicarbonate at 18. Clinically patient is hypovolemic, will increase isotonic saline to 100 ml per H, will follow on renal panel in am, avoid hypotension or nephrotoxic medications.   4, Diastolic heart failure. Continue IV fluids at a higher rate, no signs of heart failure decompensation.  5. T2DM.Continue with glucose cover and monitoring with insulin sliding scale. Today's fasting glucose is 126. Poor oral intake, positive nausea.    DVT prophylaxis:enoxaparin Code Status:full Family Communication:no family at the bedside Disposition Plan/ discharge barriers:Pending clinical improvement,  Body mass index is 25.69 kg/m. Malnutrition Type:      Malnutrition Characteristics:      Nutrition Interventions:     RN Pressure Injury Documentation:     Consultants:     Procedures:     Antimicrobials:       Subjective: Patient is not feeling well, she continue to have right sided chest pain, pleuritic in nature, radiated to the neck, mild improvement with analgesics, associated dyspnea and poor appetite. Positive nausea, but no vomiting.   Objective: Vitals:   01/02/19 1725 01/02/19 2102 01/02/19 2300 01/03/19 0814  BP: 134/77  124/74 (!) 145/59  Pulse: 81  84 91  Resp: 17  18 17   Temp: 98.2 F (36.8 C)  98.3 F (36.8 C) 98.4 F (36.9 C)  TempSrc: Oral  Oral Oral  SpO2: 99% 98% 98% 99%  Weight:      Height:        Intake/Output Summary (Last 24 hours) at 01/03/2019 2094 Last data filed at 01/03/2019 0600 Gross per 24  hour  Intake 1662.33 ml  Output 300 ml  Net 1362.33 ml   Filed Weights   01/02/2019 2058  Weight: 63.7 kg    Examination:   General: deconditioned and ill looking appearing.  Neurology: Awake and alert, non focal  E ENT: positive pallor, no icterus, oral mucosa dry.  Cardiovascular:  No JVD. S1-S2 present, rhythmic, no gallops, rubs, or murmurs. Trace lower extremity edema. Pulmonary: decreased breath sounds at the right base, poor inspiratory effort with decrased air movement, no wheezing, or rhonchi. Positive right base rales. Gastrointestinal. Abdomen with no organomegaly, non tender, no rebound or guarding Skin. No rashes Musculoskeletal: no joint deformities     Data Reviewed: I have personally reviewed following labs and imaging studies  CBC: Recent Labs  Lab 12/26/2018 1708 12/31/18 0401 01/01/19 0335 01/02/19 0311 01/03/19 0319  WBC 16.4* 14.9* 11.6* 13.2* 18.7*  NEUTROABS 14.6*  --   --  11.6* 16.8*  HGB 9.9* 8.7* 8.7* 8.3* 8.3*  HCT 32.5* 28.0* 28.7* 26.1* 26.5*  MCV 89.3 88.3 88.3 87.6 88.0  PLT 189 160 162 172 706   Basic Metabolic Panel: Recent Labs  Lab 12/29/2018 1708 12/31/18 0401 01/01/19 0335 01/02/19 0311 01/03/19 0319  NA 132* 135 137 135 136  K 4.1 4.3 4.4 3.7 4.0  CL 101 105 106 106 108  CO2 19* 21* 21* 16* 18*  GLUCOSE 194* 130* 127* 146* 126*  BUN 45* 43* 47* 44* 47*  CREATININE 2.21* 2.04* 2.35* 2.57* 3.09*  CALCIUM 8.2* 7.5* 7.3* 7.0* 7.1*   GFR: Estimated Creatinine Clearance: 12.9 mL/min (A) (by C-G formula based on SCr of 3.09 mg/dL (H)). Liver Function Tests: Recent Labs  Lab 12/20/2018 1708  AST 38  ALT 17  ALKPHOS 99  BILITOT 0.9  PROT 6.6  ALBUMIN 2.8*   Recent Labs  Lab 12/29/2018 1708  LIPASE 41   No results for input(s): AMMONIA in the last 168 hours. Coagulation Profile: No results for input(s): INR, PROTIME in the last 168 hours. Cardiac Enzymes: Recent Labs  Lab 01/01/19 0335  CKTOTAL 68   BNP (last 3 results) No results for input(s): PROBNP in the last 8760 hours. HbA1C: No results for input(s): HGBA1C in the last 72 hours. CBG: Recent Labs  Lab 01/01/19 1135 01/01/19 1633 01/01/19 2115 01/02/19 2120 01/03/19 0816  GLUCAP 165* 98 125* 144* 133*   Lipid Profile: No results for  input(s): CHOL, HDL, LDLCALC, TRIG, CHOLHDL, LDLDIRECT in the last 72 hours. Thyroid Function Tests: No results for input(s): TSH, T4TOTAL, FREET4, T3FREE, THYROIDAB in the last 72 hours. Anemia Panel: No results for input(s): VITAMINB12, FOLATE, FERRITIN, TIBC, IRON, RETICCTPCT in the last 72 hours.    Radiology Studies: I have reviewed all of the imaging during this hospital visit personally     Scheduled Meds: . ezetimibe  10 mg Oral QHS  . feeding supplement (ENSURE ENLIVE)  237 mL Oral BID BM  . heparin  5,000 Units Subcutaneous Q8H  . insulin aspart  0-5 Units Subcutaneous QHS  . insulin aspart  0-9 Units Subcutaneous TID WC  . ipratropium-albuterol  3 mL Nebulization TID  . latanoprost  1 drop Both Eyes QHS  . methocarbamol  500 mg Oral Once  . rosuvastatin  40 mg Oral QHS   Continuous Infusions: . sodium chloride 75 mL/hr at 01/03/19 0600  . azithromycin Stopped (01/02/19 1845)  . cefTRIAXone (ROCEPHIN)  IV Stopped (01/02/19 1930)     LOS: 3 days  Amedio Bowlby Gerome Apley, MD

## 2019-01-04 ENCOUNTER — Inpatient Hospital Stay (HOSPITAL_COMMUNITY): Payer: Medicare Other

## 2019-01-04 DIAGNOSIS — R579 Shock, unspecified: Secondary | ICD-10-CM

## 2019-01-04 DIAGNOSIS — R6520 Severe sepsis without septic shock: Secondary | ICD-10-CM

## 2019-01-04 DIAGNOSIS — I950 Idiopathic hypotension: Secondary | ICD-10-CM

## 2019-01-04 DIAGNOSIS — R6521 Severe sepsis with septic shock: Secondary | ICD-10-CM

## 2019-01-04 LAB — CBC WITH DIFFERENTIAL/PLATELET
Abs Immature Granulocytes: 0 10*3/uL (ref 0.00–0.07)
BAND NEUTROPHILS: 22 %
Basophils Absolute: 0 10*3/uL (ref 0.0–0.1)
Basophils Relative: 0 %
Eosinophils Absolute: 0 10*3/uL (ref 0.0–0.5)
Eosinophils Relative: 0 %
HCT: 25.8 % — ABNORMAL LOW (ref 36.0–46.0)
Hemoglobin: 7.9 g/dL — ABNORMAL LOW (ref 12.0–15.0)
Lymphocytes Relative: 2 %
Lymphs Abs: 0.6 10*3/uL — ABNORMAL LOW (ref 0.7–4.0)
MCH: 26.8 pg (ref 26.0–34.0)
MCHC: 30.6 g/dL (ref 30.0–36.0)
MCV: 87.5 fL (ref 80.0–100.0)
MONO ABS: 0 10*3/uL — AB (ref 0.1–1.0)
Monocytes Relative: 0 %
Neutro Abs: 29.9 10*3/uL — ABNORMAL HIGH (ref 1.7–7.7)
Neutrophils Relative %: 76 %
Platelets: 180 10*3/uL (ref 150–400)
RBC: 2.95 MIL/uL — ABNORMAL LOW (ref 3.87–5.11)
RDW: 17.3 % — ABNORMAL HIGH (ref 11.5–15.5)
WBC: 30.5 10*3/uL — AB (ref 4.0–10.5)
nRBC: 0 /100 WBC
nRBC: 0.1 % (ref 0.0–0.2)

## 2019-01-04 LAB — BASIC METABOLIC PANEL
Anion gap: 12 (ref 5–15)
Anion gap: 12 (ref 5–15)
BUN: 51 mg/dL — ABNORMAL HIGH (ref 8–23)
BUN: 52 mg/dL — ABNORMAL HIGH (ref 8–23)
CO2: 13 mmol/L — ABNORMAL LOW (ref 22–32)
CO2: 17 mmol/L — AB (ref 22–32)
Calcium: 6.6 mg/dL — ABNORMAL LOW (ref 8.9–10.3)
Calcium: 6.8 mg/dL — ABNORMAL LOW (ref 8.9–10.3)
Chloride: 107 mmol/L (ref 98–111)
Chloride: 109 mmol/L (ref 98–111)
Creatinine, Ser: 3.76 mg/dL — ABNORMAL HIGH (ref 0.44–1.00)
Creatinine, Ser: 3.9 mg/dL — ABNORMAL HIGH (ref 0.44–1.00)
GFR calc Af Amer: 13 mL/min — ABNORMAL LOW (ref >60)
GFR calc non Af Amer: 10 mL/min — ABNORMAL LOW (ref >60)
GFR calc non Af Amer: 11 mL/min — ABNORMAL LOW (ref >60)
GFR, EST AFRICAN AMERICAN: 12 mL/min — AB (ref >60)
Glucose, Bld: 81 mg/dL (ref 70–99)
Glucose, Bld: 93 mg/dL (ref 70–99)
Potassium: 4.2 mmol/L (ref 3.5–5.1)
Potassium: 4.3 mmol/L (ref 3.5–5.1)
Sodium: 134 mmol/L — ABNORMAL LOW (ref 135–145)
Sodium: 136 mmol/L (ref 135–145)

## 2019-01-04 LAB — GLUCOSE, CAPILLARY
Glucose-Capillary: 78 mg/dL (ref 70–99)
Glucose-Capillary: 116 mg/dL — ABNORMAL HIGH (ref 70–99)
Glucose-Capillary: 105 mg/dL — ABNORMAL HIGH (ref 70–99)
Glucose-Capillary: 97 mg/dL (ref 70–99)
Glucose-Capillary: 84 mg/dL (ref 70–99)

## 2019-01-04 LAB — CULTURE, BLOOD (ROUTINE X 2)
Culture: NO GROWTH
Culture: NO GROWTH
Special Requests: ADEQUATE

## 2019-01-04 LAB — BLOOD GAS, ARTERIAL
Acid-base deficit: 11.2 mmol/L — ABNORMAL HIGH (ref 0.0–2.0)
Bicarbonate: 13.8 mmol/L — ABNORMAL LOW (ref 20.0–28.0)
Drawn by: 330991
O2 Content: 2 L/min
O2 Saturation: 96.7 %
Patient temperature: 98.1
pCO2 arterial: 27.5 mmHg — ABNORMAL LOW (ref 32.0–48.0)
pH, Arterial: 7.318 — ABNORMAL LOW (ref 7.350–7.450)
pO2, Arterial: 85.8 mmHg (ref 83.0–108.0)

## 2019-01-04 LAB — STREP PNEUMONIAE URINARY ANTIGEN: Strep Pneumo Urinary Antigen: NEGATIVE

## 2019-01-04 LAB — CBC
HCT: 25.4 % — ABNORMAL LOW (ref 36.0–46.0)
Hemoglobin: 7.9 g/dL — ABNORMAL LOW (ref 12.0–15.0)
MCH: 27.4 pg (ref 26.0–34.0)
MCHC: 31.1 g/dL (ref 30.0–36.0)
MCV: 88.2 fL (ref 80.0–100.0)
NRBC: 0.1 % (ref 0.0–0.2)
Platelets: 170 10*3/uL (ref 150–400)
RBC: 2.88 MIL/uL — ABNORMAL LOW (ref 3.87–5.11)
RDW: 17.2 % — ABNORMAL HIGH (ref 11.5–15.5)
WBC: 27.6 10*3/uL — ABNORMAL HIGH (ref 4.0–10.5)

## 2019-01-04 LAB — DIGOXIN LEVEL: Digoxin Level: 1.6 ng/mL (ref 0.8–2.0)

## 2019-01-04 LAB — LACTIC ACID, PLASMA
LACTIC ACID, VENOUS: 0.9 mmol/L (ref 0.5–1.9)
Lactic Acid, Venous: 1.1 mmol/L (ref 0.5–1.9)

## 2019-01-04 LAB — PROCALCITONIN: Procalcitonin: 3.29 ng/mL

## 2019-01-04 LAB — AMYLASE: Amylase: 202 U/L — ABNORMAL HIGH (ref 28–100)

## 2019-01-04 LAB — LIPASE, BLOOD: Lipase: 88 U/L — ABNORMAL HIGH (ref 11–51)

## 2019-01-04 LAB — CORTISOL: Cortisol, Plasma: 31.1 ug/dL

## 2019-01-04 MED ORDER — PANTOPRAZOLE SODIUM 20 MG PO TBEC: 20.0000 mg | DELAYED_RELEASE_TABLET | Freq: Every day | ORAL | Status: DC

## 2019-01-04 MED ORDER — SODIUM CHLORIDE 0.9 % IV BOLUS
250.0000 mL | Freq: Once | INTRAVENOUS | Status: AC
  Administered 2019-01-04: 250 mL via INTRAVENOUS

## 2019-01-04 MED ORDER — DIGOXIN 0.25 MG/ML IJ SOLN
0.2500 mg | Freq: Once | INTRAMUSCULAR | Status: AC
  Administered 2019-01-04: 0.25 mg via INTRAVENOUS
  Filled 2019-01-04: qty 2

## 2019-01-04 MED ORDER — SODIUM CHLORIDE 0.9 % IV BOLUS
500.0000 mL | Freq: Once | INTRAVENOUS | Status: AC
  Administered 2019-01-04: 500 mL via INTRAVENOUS

## 2019-01-04 MED ORDER — AMIODARONE HCL IN DEXTROSE 360-4.14 MG/200ML-% IV SOLN
60.0000 mg/h | INTRAVENOUS | Status: AC
  Filled 2019-01-04: qty 200

## 2019-01-04 MED ORDER — SODIUM CHLORIDE 0.9 % IV SOLN
INTRAVENOUS | Status: DC
  Administered 2019-01-04 – 2019-01-05 (×2): via INTRAVENOUS

## 2019-01-04 MED ORDER — AMIODARONE LOAD VIA INFUSION
150.0000 mg | Freq: Once | INTRAVENOUS | Status: AC
  Administered 2019-01-04: 150 mg via INTRAVENOUS
  Filled 2019-01-04: qty 83.34

## 2019-01-04 MED ORDER — SODIUM BICARBONATE 8.4 % IV SOLN
50.0000 meq | Freq: Once | INTRAVENOUS | Status: AC
  Administered 2019-01-04: 50 meq via INTRAVENOUS
  Filled 2019-01-04: qty 50

## 2019-01-04 MED ORDER — VANCOMYCIN HCL IN DEXTROSE 1-5 GM/200ML-% IV SOLN
1000.0000 mg | Freq: Once | INTRAVENOUS | Status: AC
  Administered 2019-01-04: 1000 mg via INTRAVENOUS
  Filled 2019-01-04: qty 200

## 2019-01-04 MED ORDER — VANCOMYCIN HCL 500 MG IV SOLR: 500.0000 mg | INTRAVENOUS | Status: DC

## 2019-01-04 MED ORDER — AMIODARONE HCL IN DEXTROSE 360-4.14 MG/200ML-% IV SOLN
30.0000 mg/h | INTRAVENOUS | Status: DC
  Administered 2019-01-04 – 2019-01-06 (×6): 30 mg/h via INTRAVENOUS
  Filled 2019-01-04 (×7): qty 200

## 2019-01-04 NOTE — Progress Notes (Addendum)
PROGRESS NOTE    Kendra Todd  DPO:[id number] DOB: 1941/04/03 DOA: 12/14/2018 PCP: [name], DO    Brief Narrative:  78 year old female who presented with generalized weakness. She does have significant past medical history for coronary artery disease, paroxysmal atrial fibrillation, hypertension, diastolic heart failure, chronic kidney disease and infrarenal aortic abdominal aneurysm. Patient reported 1 week history of right upper quad abdominal pain, nausea, vomiting, fevers generalized weakness, associated with dyspnea and dry cough. On her initial physical examination she was febrile 102.2 F, blood pressure 122/77, pulse rate 89, respiratory rate29, oxygen saturation 94%.She had dry mucous membranes, lungs with rales at the right base, abdomen soft, positive guarding, mild tenderness, no lower extremity edema. Her chest x-ray had a right lower lobe opacity.  Patient was admitted to the hospitalwith theworking diagnosis of sepsis due to right lower lobe community-acquired pneumonia.   Assessment & Plan:   Principal Problem:   CAP (community acquired pneumonia) Active Problems:   DM (diabetes mellitus), type 2 with renal complications (HCC)   HTN (hypertension)   CKD (chronic kidney disease) stage 3, GFR 30-59 ml/min (HCC)   Sepsis (HCC)   Viral gastroenteritis   AKI (acute kidney injury) ([place])   Acute respiratory failure with hypoxia (HCC)   Idiopathic hypotension   1. Sepsis due to right lower lobe community acquired pneumonia (present on admission)/ complicated with acute hypoxic respiratory failure.Patient clinically worsening, last night had hypotension, and converted to atrial fibrillation, with worsening urine output. WBC this am up to 30.5, pH 7.31. Will add IV vancomycin, will recheck blood cultures and will get non contrast CT chest. If recurrent hypotension patient may need vasopressors, fluid balance is +11,165 ml since admission.   2. New onset atrial  fibrillation. Patient critically ill, will start amiodarone drip to prevent further hypotension. Continue telemetry monitoring. Hold on anticoagulation for now, she may need invasive procedures, like central line. Will check echocardiogram. Check EKG today.   3. AKI on CKD stage 3, with non gap metabolic acidosis.Documented urine output is 50 ml over last 24H, will insert foley catheter for close urine output monitoring. Serum cr is up to 3,9 with K at 4,3 and serum bicarbonaye at 17. For now continue isotonic saline at 100 ml per H. Suspected ATN, and renal function may deteriorate further.  4, Diastolic heart failure, Ef 60 to 65%.No signs of volume overload, will continue IV fluids with isotonic saline. Patient septic.  5. T2DM. Fasting glucose is 81, patient with very poor oral intake, will continue insulin sliding scale for glucose cover and monitoring.    Patient is critically ill, with worsening sepsis, will consult critical care, for possible transfer to the intensive care unit.  Critical care time 60 minutes.   DVT prophylaxis:enoxaparin Code Status:full Family Communication:no family at the bedside   Body mass index is 25.69 kg/m. Malnutrition Type:  Nutrition Problem: Inadequate oral intake Etiology: acute illness   Malnutrition Characteristics:  Signs/Symptoms: meal completion < 25%, per patient/family report   Nutrition Interventions:  Interventions: Ensure Enlive (each supplement provides 350kcal and 20 grams of protein)  RN Pressure Injury Documentation:     Consultants:     Procedures:     Antimicrobials:   Ceftriaxone  Vancomycin  Azithromycin.     Subjective: Patient is very fatigued and somnolent, able to respond to questions, but seems to be confused, continue to have right shoulder pain.   Objective: Vitals:   01/04/19 4315 01/04/19 4008 01/04/19 6761 01/04/19 9509  BP: 104/70 (!) 102/38 94/66 110/89  Pulse: (!) 134 (!) 143  (!) 129 (!) 120  Resp: 18 18 17 19   Temp:    97.8 F (36.6 C)  TempSrc:    Oral  SpO2: 100% 97% 100% 99%  Weight:      Height:        Intake/Output Summary (Last 24 hours) at 01/04/2019 0818 Last data filed at 01/04/2019 0659 Gross per 24 hour  Intake 2948.28 ml  Output 50 ml  Net 2898.28 ml   Filed Weights   12/20/2018 2058  Weight: 63.7 kg    Examination:   General: deconditioned and ill looking appearing.  Neurology: somnolent, lethargic E ENT: positive pallor, no icterus, oral mucosa moist Cardiovascular: No JVD. S1-S2 present, tachycardic irregularly irrefular, no gallops, rubs, or murmurs. Trace lower extremity edema. Pulmonary: decreased breath sounds at the right, poor inspiratory effort, no wheezing, or rhonchi, right base rales. Gastrointestinal. Abdomen mild distended with no organomegaly, non tender, no rebound or guarding Skin. No rashes Musculoskeletal: no joint deformities     Data Reviewed: I have personally reviewed following labs and imaging studies  CBC: Recent Labs  Lab 12/19/2018 1708  01/01/19 0335 01/02/19 0311 01/03/19 0319 01/03/19 2342 01/04/19 0546  WBC 16.4*   < > 11.6* 13.2* 18.7* 27.6* 30.5*  NEUTROABS 14.6*  --   --  11.6* 16.8*  --  PENDING  HGB 9.9*   < > 8.7* 8.3* 8.3* 7.9* 7.9*  HCT 32.5*   < > 28.7* 26.1* 26.5* 25.4* 25.8*  MCV 89.3   < > 88.3 87.6 88.0 88.2 87.5  PLT 189   < > 162 172 183 170 180   < > = values in this interval not displayed.   Basic Metabolic Panel: Recent Labs  Lab 01/01/19 0335 01/02/19 0311 01/03/19 0319 01/03/19 2342 01/04/19 0546  NA 137 135 136 134* 136  K 4.4 3.7 4.0 4.2 4.3  CL 106 106 108 109 107  CO2 21* 16* 18* 13* 17*  GLUCOSE 127* 146* 126* 93 81  BUN 47* 44* 47* 52* 51*  CREATININE 2.35* 2.57* 3.09* 3.76* 3.90*  CALCIUM 7.3* 7.0* 7.1* 6.8* 6.6*   GFR: Estimated Creatinine Clearance: 10.2 mL/min (A) (by C-G formula based on SCr of 3.9 mg/dL (H)). Liver Function Tests: Recent Labs    Lab 12/24/2018 1708  AST 38  ALT 17  ALKPHOS 99  BILITOT 0.9  PROT 6.6  ALBUMIN 2.8*   Recent Labs  Lab 12/12/2018 1708  LIPASE 41   No results for input(s): AMMONIA in the last 168 hours. Coagulation Profile: No results for input(s): INR, PROTIME in the last 168 hours. Cardiac Enzymes: Recent Labs  Lab 01/01/19 0335  CKTOTAL 68   BNP (last 3 results) No results for input(s): PROBNP in the last 8760 hours. HbA1C: No results for input(s): HGBA1C in the last 72 hours. CBG: Recent Labs  Lab 01/03/19 0816 01/03/19 1210 01/03/19 1718 01/03/19 2328 01/04/19 0729  GLUCAP 133* 170* 97 84 78   Lipid Profile: No results for input(s): CHOL, HDL, LDLCALC, TRIG, CHOLHDL, LDLDIRECT in the last 72 hours. Thyroid Function Tests: No results for input(s): TSH, T4TOTAL, FREET4, T3FREE, THYROIDAB in the last 72 hours. Anemia Panel: No results for input(s): VITAMINB12, FOLATE, FERRITIN, TIBC, IRON, RETICCTPCT in the last 72 hours.    Radiology Studies: I have reviewed all of the imaging during this hospital visit personally     Scheduled Meds: . ezetimibe  10 mg  Oral QHS  . feeding supplement (ENSURE ENLIVE)  237 mL Oral BID BM  . heparin  5,000 Units Subcutaneous Q8H  . insulin aspart  0-5 Units Subcutaneous QHS  . insulin aspart  0-9 Units Subcutaneous TID WC  . ipratropium-albuterol  3 mL Nebulization TID  . latanoprost  1 drop Both Eyes QHS  . rosuvastatin  40 mg Oral QHS   Continuous Infusions: . sodium chloride 100 mL/hr at 01/04/19 0659  . azithromycin Stopped (01/03/19 2000)  . cefTRIAXone (ROCEPHIN)  IV Stopped (01/03/19 1830)     LOS: 4 days        Paticia Moster Gerome Apley, MD

## 2019-01-04 NOTE — Progress Notes (Signed)
Paged by RN for concern about hypotension and tachycardia. Symptoms started to occur after administration of tramadol. According to patient, she does not feel right and feels funny. She does not complain of any pain or discomfort but states she feels weaker. On assessment, pt BP is low 80's/50's. She is tachycardiac, afebrile and slightly SOB. Given fluid bolus of 500 with improvement of BP. However, pt has put out very little urine and is still tachycardiac.   Hypotension -CBC ordered. WBC has increased to 27.6 despite being on both vancomycin and rocephin. H/H is down to 7.9/35.4 -ABG, Lactic Acid and BMP ordered. Renal function has worsened and pt has had no urine output for the nightshift according to the bedside RN.  - Fluid bolus given. Total of 1L. BP increased to 100's/70's. Continue to monitor hydration status. - Increase fluid to 126ml/hr.  -Will consider vasopressors if pt becomes hypotensive again despite giving fluid.  - Avoid narcotics and medications that may lower BP for now   Lovey Newcomer, NP [facility] 7p-7a [phone]  CRITICAL CARE Performed by: Neila Gear   Total critical care time: 40 minutes  Critical care time was exclusive of separately billable procedures and treating other patients.  Critical care was necessary to treat or prevent imminent or life-threatening deterioration.  Critical care was time spent personally by me on the following activities: development of treatment plan with patient and/or surrogate as well as nursing, discussions with consultants, evaluation of patient's response to treatment, examination of patient, obtaining history from patient or surrogate, ordering and performing treatments and interventions, ordering and review of laboratory studies, ordering and review of radiographic studies, pulse oximetry and re-evaluation of patient's condition.

## 2019-01-04 NOTE — Progress Notes (Signed)
Pharmacy Antibiotic Note  ZIPPORAH FINAMORE is a 78 y.o. female admitted on 12/07/2018 with pneumonia.  Pharmacy has been consulted for vancomycin dosing given worsening leukocytosis and minimal improvement on current therapy.  Plan: Vancomycin 1,000 mg IV x 1 followed by vancomycin 500 mg IV Q 48 hrs. Goal AUC 400-550. Expected AUC: 451.3 SCr used: 3.9  Height: 5\' 2"  (157.5 cm) Weight: 140 lb 6.9 oz (63.7 kg) IBW/kg (Calculated) : 50.1  Temp (24hrs), Avg:97.9 F (36.6 C), Min:97.7 F (36.5 C), Max:98.3 F (36.8 C)  Recent Labs  Lab 12/29/2018 1728  01/01/19 0335 01/02/19 0311 01/03/19 0319 01/03/19 2342 01/03/19 2347 01/04/19 0546  WBC  --    < > 11.6* 13.2* 18.7* 27.6*  --  30.5*  CREATININE  --    < > 2.35* 2.57* 3.09* 3.76*  --  3.90*  LATICACIDVEN 1.8  --   --   --   --   --  1.1 0.9   < > = values in this interval not displayed.    Estimated Creatinine Clearance: 10.2 mL/min (A) (by C-G formula based on SCr of 3.9 mg/dL (H)).    Allergies  Allergen Reactions  . Iron Hives  . Vicodin [Hydrocodone-Acetaminophen] Other (See Comments)    Caused arrythmia   . Amiodarone Nausea And Vomiting  . Norvasc [Amlodipine Besylate] Nausea And Vomiting  . Promethazine Other (See Comments)    hallucinations  . Vioxx [Rofecoxib] Nausea And Vomiting    Antimicrobials this admission: Azithromycin 2/25 >>  Ceftriaxone 2/25 >>   Dose adjustments this admission: None  Microbiology results: 2/25 BCx: ngtd 2/26 UCx: multiple species, suggest recollection   Thank you for allowing pharmacy to be a part of this patient's care.  Vertis Kelch, PharmD PGY1 Pharmacy Resident Phone [phone] 01/04/2019       8:23 AM

## 2019-01-04 NOTE — Significant Event (Addendum)
Rapid Response Event Note  Overview: Hypotension and Tachycardia  Initial Focused Assessment: Asked by nurse to evaluate patient for low blood pressure and an acute onset of tachycardia coupled with mild tachypnea at times. Upon arrival, patient was alert, tearful, appears fatigued, she endorses that she "doesn't feel right, her right side doesn't feel right, overall something is not right".  On exam, she was alert and oriented x 4, able to follow all commands, at baseline has right sided weakness. Currently has no sensory loss, no aphasia, no facial weakness, no vision loss, and equally weak in all extremities. She denies being lightheaded but overall does not feel well. RN had paged [facility] NP and NP came to assess patient as well. SBP in the 80s and HR in the 140s, 100% on 2L [place], RR upper 20s - low 30s at times, mild use of accessory muscles at times. Skin is cool to touch. No fever.   Interventions: -- NS 500 cc Bolus x 1 -- SBP improved into the low 100s, HR was still elevated - AF (patient has a history) - NP ordered Digoxin 0.25 mg IV - HR still in the mid 130s AF -- STAT LABS (BMP, CBC, LA) -- WBC 27.6 (up from 18.7), worsening renal function 3.78/52  - bladder scan ~ 70 cc, CO2 13 on BMP.  -- STAT ABG -- 7.31/ 28.7/86.7/67.2 -- metabolic acidosis -- reported to [facility] NP -- ordered 1 amp Sodium Bicarb  Plan of Care: -- Frequent VS and monitor patient closely.  -- I called for an update at 0200 -- HR still in the mid 120-low 130s. BP stable.  -- RN called at 0300 -- SBP in the 80s. RN to call [facility] NP on call -- I called for an update at 0500 -- RN administered 250 cc NS bolus and MIVF increased to 100cc/hr. Per RN, [facility] NP to call PCCM as well   Event Summary:    at    Call Time 2325 Arrival Time 2325 End Time Kendra Todd, [place]

## 2019-01-04 NOTE — Consult Note (Signed)
NAME:  Kendra Todd, MRN:  [id number], DOB:  03-09-1941, LOS: 4 ADMISSION DATE:  12/29/2018, CONSULTATION DATE:  01/04/2019 REFERRING MD:  Cathlean Sauer, CHIEF COMPLAINT:  Hypotension, Worsening Leukocytosis,  RLL Pneumonia, ? sepsis  Brief History   78 year old female former smoker, Quit 2014, who presented to the hospital on 12/20/2018 with generalized weakness. She does have significant past medical history for coronary artery disease, paroxysmal atrial fibrillation, hypertension, diastolic heart failure, chronic kidney disease and infrarenal aortic abdominal aneurysm. Patient reported 1 week history of right upper quad abdominal pain, nausea, vomiting, fevers generalized weakness, associated with dyspnea and dry cough. On her initial physical examination she was febrile 102.2 F, blood pressure 122/77, pulse rate 89, respiratory rate29, oxygen saturation 94%.She had dry mucous membranes, lungs with rales at the right base, abdomen soft, positive guarding, mild tenderness, no lower extremity edema. Her chest x-ray had a right lower lobe opacity, which was confirmed on CT chest 3/1.  Patient was admitted to the hospitalwith theworking diagnosis of sepsis due to right lower lobe community-acquired pneumonia. She has been treated with ABX and fluids. Early AM 3/1 patient was clinically worse. She converted into atrial fibrillation with hypotension and was started on amiodarone. WBC had bumped overnight ( 30.5), and pH was 7.31. She is currently + 12 L . PCCM have been asked to evaluate and assume care in the ICU.    History of present illness   78 year old female who presented to the hospital on 12/12/2018 with generalized weakness. She does have significant past medical history for coronary artery disease, paroxysmal atrial fibrillation, hypertension, diastolic heart failure, chronic kidney disease and infrarenal aortic abdominal aneurysm. Patient reported 1 week history of right upper quad abdominal  pain, nausea, vomiting, fevers generalized weakness, associated with dyspnea and dry cough. On her initial physical examination she was febrile 102.2 F, blood pressure 122/77, pulse rate 89, respiratory rate29, oxygen saturation 94%.She had dry mucous membranes, lungs with rales at the right base, abdomen soft, positive guarding, mild tenderness, no lower extremity edema. Her chest x-ray had a right lower lobe opacity, which was confirmed on CT chest 3/1.  Patient was admitted to the hospitalwith theworking diagnosis of sepsis due to right lower lobe community-acquired pneumonia. She has been treated with ABX and fluids. Early AM 3/1 patient was clinically worse. She converted into atrial fibrillation with hypotension and was started on amiodarone. WBC had bumped overnight ( 30.5), She is afebrile.  pH was 7.31. She is currently + 12 L . Antibiotic coverage was expanded to include renal dose vancomycin. She is continuing to complain of abdominal pain. Renal function is progressively worsening ( Creatinine is 3.90) . She had 50 cc of urine output In the last 24 hours. Lactate 3/1  am was 0.9.   PCCM were consulted 3/1. Pt was evaluated by Dr. Valeta Harms. He spoke with the family and felt it would be best to transfer the patient to the ICU for closed monitoring, as there was concern she would need pressors for BP support, and she was worsening vs improving despite antibiotic therapy and aggressive care.   Past Medical History   Past Medical History:  Diagnosis Date  . Abdominal pain, epigastric   . Acute upper respiratory infections of unspecified site   . Arthritis   . Benign paroxysmal positional vertigo   . CAD (coronary artery disease)    prior coronary stenting  . Candidiasis of vulva and vagina   . Carotid artery disease ([place]) 2001  s/p Bilateral CEA, after CVA  . Cervicalgia   . Decreased pedal pulses 06/04/2011   doppler - bilateral ABIs no evidence of insufficiency; L CIA slightly  elevated velocities 20-30% diameter reduction;   . Dyspnea 03/13/2006   Myoview - EF 76%; mild ischemia in apical lateral region, less severe than previous study in 2002  . Edema   . H/O carotid endarterectomy 03/06/2012   CT angio -high grade stenosis of proximal R internal carotid w/ lg posterior ulceration or dissection flap; near occlusive stenosis at origin of nondominantproximal L vertebral artery; <50% stenosis of proximal R vertebral and proximal L subclavian artery  . H/O endarterectomy 09/11/2012   patent L and R carotid sites, R 60-79% restenosed ICA; L <40% restenosed ICA  . H/O endarterectomy 01/08/2012   doppler - R distal common carotid/proximal ICA stenosed 80-99%, L 0-39%  . HTN (hypertension) 11/27/2012  . Hyperlipidemia   . Limb pain 05/02/2011   doppler - no evidence of thrombus of thrombophlebitis  . Long term (current) use of anticoagulants   . Loss of weight   . Lumbago   . Myalgia and myositis, unspecified   . Myocardial infarction ([place])   . Nonspecific abnormal results of liver function study   . Nontoxic uninodular goiter   . Osteoarthrosis, unspecified whether generalized or localized, unspecified site   . Other malaise and fatigue   . Other manifestations of vitamin A deficiency   . PAF (paroxysmal atrial fibrillation) ([place]) 02/21/2011  . Pain in joint, pelvic region and thigh   . Pain in limb   . Peripheral vascular disease ([place])   . Phlebitis and thrombophlebitis of superficial vessels of lower extremities   . Primary localized osteoarthrosis, hand   . Pulmonary HTN ([place]) 12/03/2012  . Renal artery stenosis ([place])   . Routine gynecological examination   . Spinal stenosis, unspecified region other than cervical   . Stenosis of artery ([place]) 06/03/2012   doppler- most distal aspect of abd aorta no aneurysmal dilation; bilateral ABIs - mild L arterial insufficiency, L CIA narrowing w/ 50-69% diameter reduction; L and R SFA both w/ 0-49% diameter reductions  . Stroke  ([place]) 2001  . Superficial vein thrombosis 05/11/2015   Left forehead  . Tobacco use disorder   . TR (tricuspid regurgitation), severe 12/02/12 for repair with CABG 12/03/2012  . Type II or unspecified type diabetes mellitus with peripheral circulatory disorders, not stated as uncontrolled(250.70)   . Unspecified cataract   . Unspecified disorder of iris and ciliary body   . Unspecified vitamin D deficiency   . Urinary tract infection, site not specified      Significant Hospital Events   Admission 12/24/2018  Consults:  01/04/2019  Procedures:    Significant Diagnostic Tests:  CT Chest w/o contrast 01/04/2019 Consolidation of the majority of the RIGHT LOWER lobe likely representing pneumonia. Small RIGHT pleural effusion. Radiographic follow-up to resolution recommended. Unchanged 4.4 cm descending thoracic aortic aneurysm. Cardiomegaly, CABG changes and coronary disease.   CT Abdomen 12/14/2018 Dense opacification in the right lower lobe most consistent with pneumonia. No acute abnormality within the abdomen or pelvis. Fatty infiltration of the liver.   Micro Data:  12/21/2018>> Blood Cultures>> No growth 12/31/18>> Multiple species present, suggest recollection 01/04/2019>> Blood  Antimicrobials:  Vancomycin  01/04/2019>> Azithromycin 12/19/2018>> Rocephin 12/31/2018>>  Interim history/subjective:  Patient continues to complain of abdominal pain. She is afebrile. WBC is up. She is alert and appropriate. She states she would want all efforts to  sustain life if she could have a meaningful quality of life. She  does not want to be left on life support Long term .  Objective   Blood pressure (!) 102/59, pulse (!) 106, temperature 98.7 F (37.1 C), temperature source Oral, resp. rate 16, height 5\' 2"  (1.575 m), weight 63.7 kg, SpO2 100 %.        Intake/Output Summary (Last 24 hours) at 01/04/2019 1643 Last data filed at 01/04/2019 1521 Gross per 24 hour  Intake 3315.35 ml  Output  50 ml  Net 3265.35 ml   Filed Weights   01/01/2019 2058  Weight: 63.7 kg    Examination: General: Deconditioned Elderly female, supine in bed, awake and alert, in NAD  HENT: NCAT, No JVD, No, LAD Lungs: Bilateral chest excursion, rhonchi per right base, diminished per right base. Wearing oxygen at 2 L [place], sats of 95-97% Cardiovascular: S1, S2, No RMG, A fib per tele Abdomen: Generalized tenderness, BS +, Slightly distended Extremities: No obvious deformities, warm and dry Neuro: MAE x 4, A&O x 3, Appropriate   Resolved Hospital Problem list     Assessment & Plan:  Suspected sepsis 2/2 RLL CAP with acute hypoxic respiratory failure WBC spike overnight to 30,000 Clinically tender abdomen Currently afebrile + 12 L Plan: Transfer to ICU Tele monitoring Trend lactate Check PCT Trend fever and WBC US Abdomen Fluid bolus of 500 cc given MAP goal of > 65 Will initiate pressors as needed Re-culture urine Re- Culture as is clinically indicated Follow blood cultures Check cortisol Consider stress dose steroids  Acute Respiratory Failure 2/2 RLL CAP Currently on 2 L  Plan Titrate oxygen to  Maintain sats of > 94% CXR daily  Continue antibiotics as above Sputum Culture if able Tessalon for cough Scheduled Duonebs Aggressive pulmonary Toilet as able IS as able Urine for strep and legionella   Atrial Fibrillation Converted into fib early am 3/1 Plan Tele monitoring Maintain mag > 2.0 Amiodarone gtt EKG in am and prn Continuous  QTc monitoring Consider Cards consult in am   AKI on CKD stage 3, with non gap metabolic acidosis Diminished UO Creatinine  Up trending  Suspected ATN serum bicarb at 17 Plan Foley Cath for accurate output Trend BMET and UO daily Maintain renal perfusion ( MAP goal > 65) Renal dose antibiotics Trend Lytes Replete lytes as needed Consider renal consult  Diastolic HF, EF 74-12% 06/7866 + 12 L Plan CXR to monitor for volume  overload Trend  BNP Echo ordered, pending Monitor I&O Foley for accurate UO  DM Type II Plan CBG's Q 4 SSI May need to add dextrose to IVF   Nutrition Plan NPO for now If remains stable without need for pressors or CVC overnight, resume diet in am     Best practice:  Diet: NPO Pain/Anxiety/Delirium protocol (if indicated):  VAP protocol (if indicated): Imlemented DVT prophylaxis: heparin SQ GI prophylaxis: Protonix Glucose control: CBG with SSI Mobility: BR Code Status:  full Family Communication: Family updated at bedside Disposition: ICU monitoring  Labs   CBC: Recent Labs  Lab 12/14/2018 1708  01/01/19 0335 01/02/19 0311 01/03/19 0319 01/03/19 2342 01/04/19 0546  WBC 16.4*   < > 11.6* 13.2* 18.7* 27.6* 30.5*  NEUTROABS 14.6*  --   --  11.6* 16.8*  --  29.9*  HGB 9.9*   < > 8.7* 8.3* 8.3* 7.9* 7.9*  HCT 32.5*   < > 28.7* 26.1* 26.5* 25.4* 25.8*  MCV 89.3   < >  88.3 87.6 88.0 88.2 87.5  PLT 189   < > 162 172 183 170 180   < > = values in this interval not displayed.    Basic Metabolic Panel: Recent Labs  Lab 01/01/19 0335 01/02/19 0311 01/03/19 0319 01/03/19 2342 01/04/19 0546  NA 137 135 136 134* 136  K 4.4 3.7 4.0 4.2 4.3  CL 106 106 108 109 107  CO2 21* 16* 18* 13* 17*  GLUCOSE 127* 146* 126* 93 81  BUN 47* 44* 47* 52* 51*  CREATININE 2.35* 2.57* 3.09* 3.76* 3.90*  CALCIUM 7.3* 7.0* 7.1* 6.8* 6.6*   GFR: Estimated Creatinine Clearance: 10.2 mL/min (A) (by C-G formula based on SCr of 3.9 mg/dL (H)). Recent Labs  Lab 12/10/2018 1728  01/02/19 0311 01/03/19 0319 01/03/19 2342 01/03/19 2347 01/04/19 0546  WBC  --    < > 13.2* 18.7* 27.6*  --  30.5*  LATICACIDVEN 1.8  --   --   --   --  1.1 0.9   < > = values in this interval not displayed.    Liver Function Tests: Recent Labs  Lab 12/25/2018 1708  AST 38  ALT 17  ALKPHOS 99  BILITOT 0.9  PROT 6.6  ALBUMIN 2.8*   Recent Labs  Lab 01/01/2019 1708  LIPASE 41   No results for  input(s): AMMONIA in the last 168 hours.  ABG    Component Value Date/Time   PHART 7.318 (L) 01/04/2019 0055   PCO2ART 27.5 (L) 01/04/2019 0055   PO2ART 85.8 01/04/2019 0055   HCO3 13.8 (L) 01/04/2019 0055   TCO2 23 12/05/2012 1628   ACIDBASEDEF 11.2 (H) 01/04/2019 0055   O2SAT 96.7 01/04/2019 0055     Coagulation Profile: No results for input(s): INR, PROTIME in the last 168 hours.  Cardiac Enzymes: Recent Labs  Lab 01/01/19 0335  CKTOTAL 68    HbA1C: Hgb A1c MFr Bld  Date/Time Value Ref Range Status  12/31/2018 04:01 AM 5.8 (H) 4.8 - 5.6 % Final    Comment:    (NOTE) Pre diabetes:          5.7%-6.4% Diabetes:              >6.4% Glycemic control for   <7.0% adults with diabetes   09/04/2018 12:34 PM 6.1 (H) <5.7 % of total Hgb Final    Comment:    For someone without known diabetes, a hemoglobin  A1c value between 5.7% and 6.4% is consistent with prediabetes and should be confirmed with a  follow-up test. . For someone with known diabetes, a value <7% indicates that their diabetes is well controlled. A1c targets should be individualized based on duration of diabetes, age, comorbid conditions, and other considerations. . This assay result is consistent with an increased risk of diabetes. . Currently, no consensus exists regarding use of hemoglobin A1c for diagnosis of diabetes for children. .     CBG: Recent Labs  Lab 01/03/19 1210 01/03/19 1718 01/03/19 2328 01/04/19 0729 01/04/19 1201  GLUCAP 170* 97 84 78 116*    Review of Systems:   Gen: Denies fever, chills, weight change,+ fatigue, night sweats HEENT: Denies blurred vision, double vision, hearing loss, tinnitus, sinus congestion, rhinorrhea, sore throat, neck stiffness, dysphagia PULM: + shortness of breath, cough, + sputum production, + hemoptysis, no wheezing CV: + chest pain, No edema, orthopnea, paroxysmal nocturnal dyspnea, + palpitations GI: + abdominal pain, nausea, vomiting,  diarrhea, hematochezia, melena, constipation, change in bowel habits GU: Denies  dysuria, hematuria, polyuria, oliguria, urethral discharge Endocrine: Denies hot or cold intolerance, polyuria, polyphagia or appetite change Derm: Denies rash, dry skin, scaling or peeling skin change Heme: Denies easy bruising, bleeding, bleeding gums Neuro: Denies headache, numbness, weakness, slurred speech, loss of memory or consciousness  Past Medical History  She,  has a past medical history of Abdominal pain, epigastric, Acute upper respiratory infections of unspecified site, Arthritis, Benign paroxysmal positional vertigo, CAD (coronary artery disease), Candidiasis of vulva and vagina, Carotid artery disease ([place]) (2001), Cervicalgia, Decreased pedal pulses (06/04/2011), Dyspnea (03/13/2006), Edema, H/O carotid endarterectomy (03/06/2012), H/O endarterectomy (09/11/2012), H/O endarterectomy (01/08/2012), HTN (hypertension) (11/27/2012), Hyperlipidemia, Limb pain (05/02/2011), Long term (current) use of anticoagulants, Loss of weight, Lumbago, Myalgia and myositis, unspecified, Myocardial infarction ([place]), Nonspecific abnormal results of liver function study, Nontoxic uninodular goiter, Osteoarthrosis, unspecified whether generalized or localized, unspecified site, Other malaise and fatigue, Other manifestations of vitamin A deficiency, PAF (paroxysmal atrial fibrillation) ([place]) (02/21/2011), Pain in joint, pelvic region and thigh, Pain in limb, Peripheral vascular disease ([place]), Phlebitis and thrombophlebitis of superficial vessels of lower extremities, Primary localized osteoarthrosis, hand, Pulmonary HTN ([place]) (12/03/2012), Renal artery stenosis [facility]), Routine gynecological examination, Spinal stenosis, unspecified region other than cervical, Stenosis of artery ([place]) (06/03/2012), Stroke ([place]) (2001), Superficial vein thrombosis (05/11/2015), Tobacco use disorder, TR (tricuspid regurgitation), severe 12/02/12 for repair with CABG  (12/03/2012), Type II or unspecified type diabetes mellitus with peripheral circulatory disorders, not stated as uncontrolled(250.70), Unspecified cataract, Unspecified disorder of iris and ciliary body, Unspecified vitamin D deficiency, and Urinary tract infection, site not specified.   Surgical History    Past Surgical History:  Procedure Laterality Date  . ABDOMINAL HYSTERECTOMY  1975  . ANGIOPLASTY  1984 and 1982  . APPENDECTOMY  1954  . ARCH AORTOGRAM  03/30/2011   40% recurrent stenosis at origin of R proximal carotid patch, shelf like recurrent stenosis w/in midsection of patch that does not appear to be flow limiting; normal non-stenosed great vessel origins  . BACK SURGERY  '84,'86, '87   three previous surgeries  . CAROTID ENDARTERECTOMY  2001   Bilateral  . CORONARY ARTERY BYPASS GRAFT  12/04/2012   Procedure: CORONARY ARTERY BYPASS GRAFTING (CABG);  Surgeon: Ivin Poot, MD; LIMA-LAD, SVG-OM, SVG-RCA  . ESOPHAGOGASTRODUODENOSCOPY (EGD) WITH PROPOFOL N/A 08/27/2018   Procedure: ESOPHAGOGASTRODUODENOSCOPY (EGD) WITH PROPOFOL;  Surgeon: Mauri Pole, MD;  Location: [facility] ENDOS[facility];  Service: Endoscopy;  Laterality: N/A;  . INTRAOPERATIVE TRANSESOPHAGEAL ECHOCARDIOGRAM  12/04/2012   Procedure: INTRAOPERATIVE TRANSESOPHAGEAL ECHOCARDIOGRAM;  Surgeon: Ivin Poot, MD;  Location: [facility];  Service: Open Heart Surgery;  Laterality: N/A;  . KIDNEY STONE SURGERY     Removal  . LEFT HEART CATHETERIZATION WITH CORONARY ANGIOGRAM N/A 11/28/2012   Procedure: LEFT HEART CATHETERIZATION WITH CORONARY ANGIOGRAM;  Surgeon: Leonie Man, MD;  Location: [facility] CATH LAB;  Service: Cardiovascular;  Laterality: N/A;  . PERCUTANEOUS CORONARY STENT INTERVENTION (PCI-S)  2002   2 BMS in ostial/Prox & mid RCA (mid 2.5 mm 20x  mm, & 2.75 mm x  8 mm ostial)  . PERIPHERAL VASCULAR INTERVENTION  07/21/2018   Procedure: PERIPHERAL VASCULAR INTERVENTION;  Surgeon: Lorretta Harp, MD;  Location: [facility] CV LAB;  Service: Cardiovascular;;  bilateral renal stents  . RENAL ANGIOGRAM N/A 11/30/2013   Procedure: RENAL ANGIOGRAM;  Surgeon: Lorretta Harp, MD;  Location[facility] CATH LAB;  Service: Cardiovascular;  Laterality: N/A;  . RENAL ANGIOGRAPHY N/A 07/21/2018   Procedure: RENAL ANGIOGRAPHY;  Surgeon: Lorretta Harp, MD;  Location: [place] CV LAB;  Service: Cardiovascular;  Laterality: N/A;  . RIGHT HEART CATHETERIZATION N/A 12/02/2012   Procedure: RIGHT HEART CATH;  Surgeon: Troy Sine, MD;  Location: [facility] CATH LAB;  Service: Cardiovascular;  Laterality: N/A;  . TEE WITH CARDIOVERSION  01/23/2011   EF 29-19%; grade 2 diastolic dysfunction, elevated mean LA filling pressure, RV systolic pressure increased consistent w/ mod pulmonary hypertension     Social History   reports that she quit smoking about 6 years ago. Her smoking use included cigarettes. She has a 17.10 pack-year smoking history. She has never used smokeless tobacco. She reports current alcohol use. She reports that she does not use drugs.   Family History   Her family history includes Cancer in her sister and another family member; Diabetes in her brother, daughter, mother, sister, and another family member; Heart attack in her father and mother; Heart disease in her brother, brother, brother, father, mother, sister, sister, sister, and another family member; Other in her mother and another family member; Stroke in her brother, brother, and brother. There is no history of Colon cancer, Esophageal cancer, Inflammatory bowel disease, Liver disease, Pancreatic cancer, Rectal cancer, or Stomach cancer.   Allergies Allergies  Allergen Reactions  . Iron Hives  . Vicodin [Hydrocodone-Acetaminophen] Other (See Comments)    Caused arrythmia   . Amiodarone Nausea And Vomiting  . Norvasc [Amlodipine Besylate] Nausea And Vomiting  . Promethazine Other (See Comments)    hallucinations  . Vioxx [Rofecoxib] Nausea And Vomiting       Home Medications  Prior to Admission medications   Medication Sig Start Date End Date Taking? Authorizing Provider  alendronate (FOSAMAX) 70 MG tablet Take 70 mg by mouth every Friday.  07/13/16  Yes [provider]  bimatoprost (LUMIGAN) 0.01 % SOLN Place 1 drop into both eyes at bedtime.   Yes [provider]  carvedilol (COREG) 12.5 MG tablet Take 1 tablet (12.5 mg total) by mouth 2 (two) times daily with a meal. 09/09/18  Yes Black, Lezlie Octave, NP  ezetimibe (ZETIA) 10 MG tablet Take 10 mg by mouth at bedtime.    Yes [provider]  metFORMIN (GLUCOPHAGE) 1000 MG tablet Take 1 tablet (1,000 mg total) by mouth 2 (two) times daily with a meal. 12/15/18  Yes Reed, Tiffany L, DO  nitroGLYCERIN (NITROSTAT) 0.4 MG SL tablet Place 1 tablet (0.4 mg total) under the tongue every 5 (five) minutes as needed for chest pain. 01/31/18  Yes Croitoru, Mihai, MD  rosuvastatin (CRESTOR) 40 MG tablet TAKE 1 TABLET DAILY FOR CHOLESTEROL Patient taking differently: Take 40 mg by mouth daily.  12/09/18  Yes Reed, Tiffany L, DO  glucose blood (ONETOUCH VERIO) test strip Use as instructed to test blood sugar twice daily DX: E11.29 09/04/18   [name], DO     Critical care App  time: 70 minutes    Magdalen Spatz, AGACNP-BC [place] Pager # [phone] After 4 pm please call [phone] 01/04/2019 4:43 PM

## 2019-01-04 DEATH — deceased

## 2019-01-05 ENCOUNTER — Inpatient Hospital Stay (HOSPITAL_COMMUNITY): Payer: Medicare Other

## 2019-01-05 DIAGNOSIS — I361 Nonrheumatic tricuspid (valve) insufficiency: Secondary | ICD-10-CM

## 2019-01-05 LAB — POCT I-STAT 7, (LYTES, BLD GAS, ICA,H+H)
Acid-base deficit: 12 mmol/L — ABNORMAL HIGH (ref 0.0–2.0)
Bicarbonate: 13.9 mmol/L — ABNORMAL LOW (ref 20.0–28.0)
Calcium, Ion: 0.97 mmol/L — ABNORMAL LOW (ref 1.15–1.40)
HCT: 24 % — ABNORMAL LOW (ref 36.0–46.0)
HEMOGLOBIN: 8.2 g/dL — AB (ref 12.0–15.0)
O2 Saturation: 95 %
POTASSIUM: 4.3 mmol/L (ref 3.5–5.1)
Sodium: 131 mmol/L — ABNORMAL LOW (ref 135–145)
TCO2: 15 mmol/L — ABNORMAL LOW (ref 22–32)
pCO2 arterial: 29.6 mmHg — ABNORMAL LOW (ref 32.0–48.0)
pH, Arterial: 7.276 — ABNORMAL LOW (ref 7.350–7.450)
pO2, Arterial: 80 mmHg — ABNORMAL LOW (ref 83.0–108.0)

## 2019-01-05 LAB — CBC WITH DIFFERENTIAL/PLATELET
BAND NEUTROPHILS: 0 %
Basophils Absolute: 0 10*3/uL (ref 0.0–0.1)
Basophils Relative: 0 %
Blasts: 0 %
EOS ABS: 0 10*3/uL (ref 0.0–0.5)
Eosinophils Relative: 0 %
HCT: 26 % — ABNORMAL LOW (ref 36.0–46.0)
Hemoglobin: 8.1 g/dL — ABNORMAL LOW (ref 12.0–15.0)
Lymphocytes Relative: 2 %
Lymphs Abs: 0.7 10*3/uL (ref 0.7–4.0)
MCH: 27.4 pg (ref 26.0–34.0)
MCHC: 31.2 g/dL (ref 30.0–36.0)
MCV: 87.8 fL (ref 80.0–100.0)
Metamyelocytes Relative: 0 %
Monocytes Absolute: 1.4 10*3/uL — ABNORMAL HIGH (ref 0.1–1.0)
Monocytes Relative: 4 %
Myelocytes: 0 %
Neutro Abs: 34 10*3/uL — ABNORMAL HIGH (ref 1.7–7.7)
Neutrophils Relative %: 94 %
Other: 0 %
Platelets: 210 10*3/uL (ref 150–400)
Promyelocytes Relative: 0 %
RBC: 2.96 MIL/uL — ABNORMAL LOW (ref 3.87–5.11)
RDW: 17.8 % — ABNORMAL HIGH (ref 11.5–15.5)
WBC: 36.1 10*3/uL — AB (ref 4.0–10.5)
nRBC: 0 /100 WBC
nRBC: 0.2 % (ref 0.0–0.2)

## 2019-01-05 LAB — COMPREHENSIVE METABOLIC PANEL
ALT: 138 U/L — ABNORMAL HIGH (ref 0–44)
AST: 959 U/L — ABNORMAL HIGH (ref 15–41)
Albumin: 1.7 g/dL — ABNORMAL LOW (ref 3.5–5.0)
Alkaline Phosphatase: 406 U/L — ABNORMAL HIGH (ref 38–126)
Anion gap: 15 (ref 5–15)
BUN: 54 mg/dL — ABNORMAL HIGH (ref 8–23)
CO2: 13 mmol/L — AB (ref 22–32)
Calcium: 6.1 mg/dL — CL (ref 8.9–10.3)
Chloride: 101 mmol/L (ref 98–111)
Creatinine, Ser: 4.44 mg/dL — ABNORMAL HIGH (ref 0.44–1.00)
GFR calc Af Amer: 10 mL/min — ABNORMAL LOW (ref >60)
GFR calc non Af Amer: 9 mL/min — ABNORMAL LOW (ref >60)
Glucose, Bld: 265 mg/dL — ABNORMAL HIGH (ref 70–99)
Potassium: 4.3 mmol/L (ref 3.5–5.1)
Sodium: 129 mmol/L — ABNORMAL LOW (ref 135–145)
Total Bilirubin: 1.1 mg/dL (ref 0.3–1.2)
Total Protein: 5.3 g/dL — ABNORMAL LOW (ref 6.5–8.1)

## 2019-01-05 LAB — BASIC METABOLIC PANEL
Anion gap: 14 (ref 5–15)
BUN: 60 mg/dL — AB (ref 8–23)
CO2: 13 mmol/L — ABNORMAL LOW (ref 22–32)
Calcium: 6.3 mg/dL — CL (ref 8.9–10.3)
Chloride: 106 mmol/L (ref 98–111)
Creatinine, Ser: 5.07 mg/dL — ABNORMAL HIGH (ref 0.44–1.00)
GFR calc Af Amer: 9 mL/min — ABNORMAL LOW (ref >60)
GFR calc non Af Amer: 8 mL/min — ABNORMAL LOW (ref >60)
Glucose, Bld: 79 mg/dL (ref 70–99)
Potassium: 5 mmol/L (ref 3.5–5.1)
Sodium: 133 mmol/L — ABNORMAL LOW (ref 135–145)

## 2019-01-05 LAB — GLUCOSE, CAPILLARY
GLUCOSE-CAPILLARY: 76 mg/dL (ref 70–99)
Glucose-Capillary: 70 mg/dL (ref 70–99)
Glucose-Capillary: 70 mg/dL (ref 70–99)
Glucose-Capillary: 76 mg/dL (ref 70–99)
Glucose-Capillary: 76 mg/dL (ref 70–99)
Glucose-Capillary: 97 mg/dL (ref 70–99)

## 2019-01-05 LAB — POCT I-STAT EG7
ACID-BASE EXCESS: 28 mmol/L — AB (ref 0.0–2.0)
Bicarbonate: 55.2 mmol/L — ABNORMAL HIGH (ref 20.0–28.0)
Calcium, Ion: 0.57 mmol/L — CL (ref 1.15–1.40)
HCT: 20 % — ABNORMAL LOW (ref 36.0–46.0)
Hemoglobin: 6.8 g/dL — CL (ref 12.0–15.0)
O2 Saturation: 41 %
Patient temperature: 98.6
Potassium: 3.1 mmol/L — ABNORMAL LOW (ref 3.5–5.1)
Sodium: 133 mmol/L — ABNORMAL LOW (ref 135–145)
TCO2: >50 mmol/L — ABNORMAL HIGH (ref 22–32)
pCO2, Ven: 84.2 mmHg (ref 44.0–60.0)
pH, Ven: 7.425 (ref 7.250–7.430)
pO2, Ven: 25 mmHg — CL (ref 32.0–45.0)

## 2019-01-05 LAB — PROCALCITONIN: Procalcitonin: 4.15 ng/mL

## 2019-01-05 LAB — ECHOCARDIOGRAM COMPLETE
Height: 62 in
WEIGHTICAEL: 2246.93 [oz_av]

## 2019-01-05 LAB — LACTIC ACID, PLASMA
LACTIC ACID, VENOUS: 1.5 mmol/L (ref 0.5–1.9)
LACTIC ACID, VENOUS: 1.9 mmol/L (ref 0.5–1.9)

## 2019-01-05 LAB — NA AND K (SODIUM & POTASSIUM), RAND UR
POTASSIUM UR: 35 mmol/L
Sodium, Ur: 52 mmol/L

## 2019-01-05 LAB — BRAIN NATRIURETIC PEPTIDE: B Natriuretic Peptide: 292.2 pg/mL — ABNORMAL HIGH (ref 0.0–100.0)

## 2019-01-05 LAB — OSMOLALITY, URINE: Osmolality, Ur: 302 mOsm/kg (ref 300–900)

## 2019-01-05 LAB — MAGNESIUM: Magnesium: 2 mg/dL (ref 1.7–2.4)

## 2019-01-05 MED ORDER — HEPARIN SODIUM (PORCINE) 1000 UNIT/ML IJ SOLN
1000.0000 [IU] | INTRAMUSCULAR | Status: DC | PRN
  Administered 2019-01-06 (×3): 1000 [IU] via INTRAVENOUS
  Filled 2019-01-05 (×4): qty 1

## 2019-01-05 MED ORDER — NOREPINEPHRINE 16 MG/250ML-% IV SOLN
0.0000 ug/min | INTRAVENOUS | Status: DC
  Administered 2019-01-05: 15 ug/min via INTRAVENOUS
  Administered 2019-01-06 – 2019-01-07 (×4): 40 ug/min via INTRAVENOUS
  Filled 2019-01-05 (×6): qty 250

## 2019-01-05 MED ORDER — STERILE WATER FOR INJECTION IV SOLN
INTRAVENOUS | Status: DC
  Administered 2019-01-05 – 2019-01-06 (×4): via INTRAVENOUS
  Filled 2019-01-05 (×7): qty 850

## 2019-01-05 MED ORDER — SODIUM CHLORIDE 0.9 % IV SOLN: INTRAVENOUS | Status: DC | PRN

## 2019-01-05 MED ORDER — SODIUM CHLORIDE 0.9 % IV SOLN
250.0000 mL | INTRAVENOUS | Status: DC
  Administered 2019-01-06: 10 mL via INTRAVENOUS

## 2019-01-05 MED ORDER — PHENYLEPHRINE HCL-NACL 10-0.9 MG/250ML-% IV SOLN
INTRAVENOUS | Status: AC
  Filled 2019-01-05: qty 250

## 2019-01-05 MED ORDER — ORAL CARE MOUTH RINSE
15.0000 mL | Freq: Two times a day (BID) | OROMUCOSAL | Status: DC
  Administered 2019-01-05: 15 mL via OROMUCOSAL

## 2019-01-05 MED ORDER — DEXTROSE 50 % IV SOLN
INTRAVENOUS | Status: AC
  Administered 2019-01-05: 50 mL
  Filled 2019-01-05: qty 50

## 2019-01-05 MED ORDER — NOREPINEPHRINE 4 MG/250ML-% IV SOLN
INTRAVENOUS | Status: AC
  Filled 2019-01-05: qty 250

## 2019-01-05 MED ORDER — ALBUMIN HUMAN 5 % IV SOLN
25.0000 g | Freq: Four times a day (QID) | INTRAVENOUS | Status: AC
  Administered 2019-01-05 (×2): 25 g via INTRAVENOUS
  Filled 2019-01-05 (×2): qty 250

## 2019-01-05 MED ORDER — INSULIN ASPART 100 UNIT/ML ~~LOC~~ SOLN: 0.0000 [IU] | SUBCUTANEOUS | Status: DC

## 2019-01-05 MED ORDER — PANTOPRAZOLE SODIUM 40 MG IV SOLR
40.0000 mg | INTRAVENOUS | Status: DC
  Administered 2019-01-05 – 2019-01-06 (×2): 40 mg via INTRAVENOUS
  Filled 2019-01-05 (×2): qty 40

## 2019-01-05 MED ORDER — SODIUM CHLORIDE 0.9% FLUSH
10.0000 mL | Freq: Two times a day (BID) | INTRAVENOUS | Status: DC
  Administered 2019-01-06 – 2019-01-07 (×3): 10 mL

## 2019-01-05 MED ORDER — SODIUM CHLORIDE 0.9 % IV BOLUS: 500.0000 mL | Freq: Once | INTRAVENOUS | Status: DC

## 2019-01-05 MED ORDER — METRONIDAZOLE IN NACL 5-0.79 MG/ML-% IV SOLN
500.0000 mg | Freq: Three times a day (TID) | INTRAVENOUS | Status: DC
  Administered 2019-01-05 – 2019-01-07 (×6): 500 mg via INTRAVENOUS
  Filled 2019-01-05 (×6): qty 100

## 2019-01-05 MED ORDER — CHLORHEXIDINE GLUCONATE CLOTH 2 % EX PADS: 6.0000 | MEDICATED_PAD | Freq: Every day | CUTANEOUS | Status: DC

## 2019-01-05 MED ORDER — SODIUM CHLORIDE 0.9% FLUSH: 10.0000 mL | INTRAVENOUS | Status: DC | PRN

## 2019-01-05 MED ORDER — NOREPINEPHRINE 4 MG/250ML-% IV SOLN
2.0000 ug/min | INTRAVENOUS | Status: DC
  Administered 2019-01-05: 10 ug/min via INTRAVENOUS
  Administered 2019-01-05: 2 ug/min via INTRAVENOUS
  Filled 2019-01-05 (×2): qty 250

## 2019-01-05 MED ORDER — SODIUM CHLORIDE 0.9 % IV BOLUS
250.0000 mL | Freq: Once | INTRAVENOUS | Status: AC
  Administered 2019-01-05: 250 mL via INTRAVENOUS

## 2019-01-05 MED ORDER — CALCIUM GLUCONATE-NACL 1-0.675 GM/50ML-% IV SOLN
1.0000 g | Freq: Once | INTRAVENOUS | Status: AC
  Administered 2019-01-05: 1000 mg via INTRAVENOUS
  Filled 2019-01-05: qty 50

## 2019-01-05 MED ORDER — SODIUM BICARBONATE 8.4 % IV SOLN: INTRAVENOUS | Status: DC

## 2019-01-05 MED ORDER — ALBUMIN HUMAN 5 % IV SOLN
12.5000 g | Freq: Once | INTRAVENOUS | Status: AC
  Administered 2019-01-05: 12.5 g via INTRAVENOUS
  Filled 2019-01-05: qty 250

## 2019-01-05 NOTE — Progress Notes (Signed)
  Echocardiogram 2D Echocardiogram has been performed.  Kendra Todd 01/05/2019, 2:03 PM

## 2019-01-05 NOTE — Progress Notes (Addendum)
78 year old woman admitted 2/25 with fevers and weakness, leukocytosis found to have right lower lobe pneumonia. CT chest and abdomen were personally reviewed. She was transferred to ICU on 3/1 for worsening AKI, leukocytosis and atrial fibrillation/RVR Requiring about 9 mics of Levophed  On exam-awake interactive, very thirsty and asking for water, dry mouth and tongue, 1+ edema overall, decreased breath sounds bilateral, soft diffuse tenderness over abdomen, no guarding, hypoactive bowel sounds, nonfocal, S1-S2 irregular, rate controlled.  Labs reviewed which shows elevated AST/ALT and alk phos, increasing leukocytosis, worsening creatinine with anion gap acidosis.  Impression/plan  Septic shock-presumed source is pneumonia right lower lobe, may have a complex effusion at this time.  Abdominal exam is concerning today even though CT abdomen 2/25 was negative and ultrasound abdomen was also negative. We will repeat CT chest with abdomen and she has had stable aneurysms infrarenal and descending thoracic aortic, no evidence of hemorrhage or hemoglobin drop Continue broad-spectrum antibiotics empiric ceftriaxone, can discontinue azithromycin and vancomycin at this time She appears intravascularly dry although extra vascularly fluid overloaded, will use 1 dose of albumin to volume expand.  Atrial fibrillation/RVR continue amiodarone drip.  AKI on CKD/anion gap metabolic acidosis-awaiting renal input. Start bicarbonate drip and sterile water  Elevated LFTs-unclear cause, also elevated alk phos, ultrasound abdomen did not show any evidence of cholestasis, presumed intrahepatic cholestasis will follow. DC statin  The patient is critically ill with multiple organ systems failure and requires high complexity decision making for assessment and support, frequent evaluation and titration of therapies, application of advanced monitoring technologies and extensive interpretation of multiple databases.  Critical Care Time devoted to patient care services described in this note independent of APP/resident  time is 35 minutes.   Kara Mead MD. Shade Flood. [facility] Pager [phone] If no response call [phone]   01/05/2019

## 2019-01-05 NOTE — Procedures (Signed)
Hemodialysis Catheter Insertion Procedure Note Kendra Todd [id number] [birth year]  Procedure: Insertion of Hemodialysis Catheter Indications: CRRT and vasopressors  Procedure Details Consent: Unable to obtain consent because of emergent medical necessity. Time Out: Verified patient identification, verified procedure, site/side was marked, verified correct patient position, special equipment/implants available, medications/allergies/relevent history reviewed, required imaging and test results available.  Performed  Maximum sterile technique was used including antiseptics, cap, gloves, gown, hand hygiene, mask and sheet. Skin prep: Chlorhexidine; local anesthetic administered A antimicrobial bonded/coated triple lumen catheter was placed in the right femoral vein due to no safe alternative site using the Seldinger technique.  Evaluation Blood flow good Complications: No apparent complications Patient did tolerate procedure well. Chest X-ray ordered to verify placement.  CXR: not needed.  Procedure performed under direct ultrasound guidance for real time vessel cannulation.      Kendra Todd, [facility] Pgr: [phone]  or [phone] 01/05/2019, 11:18 PM

## 2019-01-05 NOTE — Progress Notes (Signed)
NAME:  Kendra Todd, MRN:  [id number], DOB:  11-07-1940, LOS: 5 ADMISSION DATE:  12/12/2018, CONSULTATION DATE:  01/04/2019 REFERRING MD:  Cathlean Sauer, CHIEF COMPLAINT:  Hypotension, Worsening Leukocytosis,  RLL Pneumonia, ? sepsis  Brief History   This is a 78 y.o., female admitted on 12/21/2018 for right lower lobe pneumonia sepsis hypotension.  Patient with past medical history of former smoker quit 2014 presented to the hospital generalized weakness paraseptal atrial fibrillation, hypertension, diastolic heart failure, chronic kidney disease a stable thoracic aneurysm on CT imaging.  Patient found to be fatigued with fevers generalized weakness.  T-max 102.  Patient was started on antibiotics.  12 L positive white count 30,000.  Patient in atrial fibrillation with RVR.  Patient was placed on amiodarone.  Critical care was consulted for recommendations and management and transfer to the intensive care unit.   Significant Hospital Events   Admission 12/20/2018  Consults:  01/04/2019  Procedures:    Significant Diagnostic Tests:  CT Chest w/o contrast 01/04/2019 Consolidation of the majority of the RIGHT LOWER lobe likely representing pneumonia. Small RIGHT pleural effusion. Radiographic follow-up to resolution recommended. Unchanged 4.4 cm descending thoracic aortic aneurysm. Cardiomegaly, CABG changes and coronary disease.   CT Abdomen 12/10/2018 Dense opacification in the right lower lobe most consistent with pneumonia. No acute abnormality within the abdomen or pelvis. Fatty infiltration of the liver.  RUQ u/s>>> neg acute. Complex R pleural effusion.   Micro Data:  01/01/2019>> Blood Cultures>> NEG 12/31/18>> Multiple species present, suggest recollection>>> 01/04/2019  Blood >>>>  Antimicrobials:  Vancomycin  01/04/2019>> Azithromycin 01/01/2019>> Rocephin 12/07/2018>>  Interim history/subjective:  Hypotensive overnight.  Decreased UOP   Objective   Blood pressure 101/77, pulse  (!) 109, temperature (!) 97.1 F (36.2 C), temperature source Oral, resp. rate 18, height 5\' 2"  (1.575 m), weight 63.7 kg, SpO2 100 %.        Intake/Output Summary (Last 24 hours) at 01/05/2019 0915 Last data filed at 01/05/2019 0700 Gross per 24 hour  Intake 1512.45 ml  Output 50 ml  Net 1462.45 ml   Filed Weights   12/11/2018 2058  Weight: 63.7 kg    Examination: General: Deconditioned Elderly female, NAD, awake and alert  HENT: NCAT, No JVD, No, LAD, mm dry  Lungs: resps even non labored on [place], diminished bases R>L  Cardiovascular: S1, S2, No RMG, A fib per tele Abdomen: mildly distended, tender diffusely, hypoactive bs  Extremities: No obvious deformities, warm and dry Neuro: MAE x 4, A&O x 3, Appropriate   Resolved Hospital Problem list     Assessment & Plan:  Sepsis/ septic shock r/t CAP - lactate reassuring Fever  Plan: Trend lactate  Low dose levo gtt to keep MAP >65 Trend pct (3.2->4.15) Follow cultures  re culture urine  Continue broad spectrum abx as above - azithro, ceftriaxone, vanc   Acute Respiratory Failure 2/2 RLL CAP Plan Supplemental O2 as needed to keep sats >92%  F/u CXR  abx as above  Continue duonebs  Pulmonary hygiene as able  Sputum culture if able    Atrial Fibrillation Plan Tele monitoring Maintain mag > 2.0 Continue Amiodarone gtt EKG prn   AKI on CKD stage 3 non gap metabolic acidosis Diminished UO Creatinine  Up trending  Suspected ATN serum bicarb at 17 Recent Labs  Lab 01/02/19 0311 01/03/19 0319 01/03/19 2342 01/04/19 0546 01/05/19 0358  CREATININE 2.57* 3.09* 3.76* 3.90* 4.44*   Plan Renal to see  Add HCO3 gtt  F/u  chem  Monitor UOP  Will give additional gentle volume -- note POS I/o but appears intravascularly dry  Pharmacy dose abx  Consider CVL for CVP - hold for now  Diastolic HF, EF 16-07% 01/7105 + 12 L Plan CXR to monitor for volume overload Trend  BNP Echo pending Monitor I&O Foley for accurate  UO  DM Type II Plan CBG's Q 4 SSI - increase SSI to moderate scale    Nutrition Plan NPO for now Ice chips ok   abd pain  Fever  elevated LFT's - RUQ u/s neg acute abd issue  PLAN -  CT abd pelvis now  F/u LFT's in am  BP support as above   Hypocalcemia  Hyponatremia  PLAN -  F/u chem  Ca replaced overnight   Best practice:  Diet: NPO Pain/Anxiety/Delirium protocol (if indicated): n/a VAP protocol (if indicated): Imlemented DVT prophylaxis: heparin SQ GI prophylaxis: Protonix Glucose control: CBG with SSI Mobility: BR Code Status:  full Family Communication: no family at bedside on NP rounds 3/2 Disposition: ICU monitoring  Labs   CBC: Recent Labs  Lab 12/09/2018 1708  01/02/19 0311 01/03/19 0319 01/03/19 2342 01/04/19 0546 01/05/19 0246 01/05/19 0358  WBC 16.4*   < > 13.2* 18.7* 27.6* 30.5*  --  36.1*  NEUTROABS 14.6*  --  11.6* 16.8*  --  29.9*  --  34.0*  HGB 9.9*   < > 8.3* 8.3* 7.9* 7.9* 8.2* 8.1*  HCT 32.5*   < > 26.1* 26.5* 25.4* 25.8* 24.0* 26.0*  MCV 89.3   < > 87.6 88.0 88.2 87.5  --  87.8  PLT 189   < > 172 183 170 180  --  210   < > = values in this interval not displayed.    Basic Metabolic Panel: Recent Labs  Lab 01/02/19 0311 01/03/19 0319 01/03/19 2342 01/04/19 0546 01/05/19 0246 01/05/19 0358  NA 135 136 134* 136 131* 129*  K 3.7 4.0 4.2 4.3 4.3 4.3  CL 106 108 109 107  --  101  CO2 16* 18* 13* 17*  --  13*  GLUCOSE 146* 126* 93 81  --  265*  BUN 44* 47* 52* 51*  --  54*  CREATININE 2.57* 3.09* 3.76* 3.90*  --  4.44*  CALCIUM 7.0* 7.1* 6.8* 6.6*  --  6.1*  MG  --   --   --   --   --  2.0   GFR: Estimated Creatinine Clearance: 9 mL/min (A) (by C-G formula based on SCr of 4.44 mg/dL (H)). Recent Labs  Lab 12/11/2018 1728  01/03/19 0319 01/03/19 2342 01/03/19 2347 01/04/19 0546 01/04/19 1824 01/05/19 0358  PROCALCITON  --   --   --   --   --   --  3.29 4.15  WBC  --    < > 18.7* 27.6*  --  30.5*  --  36.1*    LATICACIDVEN 1.8  --   --   --  1.1 0.9  --  1.5   < > = values in this interval not displayed.    Liver Function Tests: Recent Labs  Lab 12/18/2018 1708 01/05/19 0358  AST 38 959*  ALT 17 138*  ALKPHOS 99 406*  BILITOT 0.9 1.1  PROT 6.6 5.3*  ALBUMIN 2.8* 1.7*   Recent Labs  Lab 12/31/2018 1708 01/04/19 1824  LIPASE 41 88*  AMYLASE  --  202*   No results for input(s): AMMONIA in the last 168 hours.  ABG    Component Value Date/Time   PHART 7.276 (L) 01/05/2019 0246   PCO2ART 29.6 (L) 01/05/2019 0246   PO2ART 80.0 (L) 01/05/2019 0246   HCO3 13.9 (L) 01/05/2019 0246   TCO2 15 (L) 01/05/2019 0246   ACIDBASEDEF 12.0 (H) 01/05/2019 0246   O2SAT 95.0 01/05/2019 0246     Coagulation Profile: No results for input(s): INR, PROTIME in the last 168 hours.  Cardiac Enzymes: Recent Labs  Lab 01/01/19 0335  CKTOTAL 68    HbA1C: Hgb A1c MFr Bld  Date/Time Value Ref Range Status  12/31/2018 04:01 AM 5.8 (H) 4.8 - 5.6 % Final    Comment:    (NOTE) Pre diabetes:          5.7%-6.4% Diabetes:              >6.4% Glycemic control for   <7.0% adults with diabetes   09/04/2018 12:34 PM 6.1 (H) <5.7 % of total Hgb Final    Comment:    For someone without known diabetes, a hemoglobin  A1c value between 5.7% and 6.4% is consistent with prediabetes and should be confirmed with a  follow-up test. . For someone with known diabetes, a value <7% indicates that their diabetes is well controlled. A1c targets should be individualized based on duration of diabetes, age, comorbid conditions, and other considerations. . This assay result is consistent with an increased risk of diabetes. . Currently, no consensus exists regarding use of hemoglobin A1c for diagnosis of diabetes for children. .     CBG: Recent Labs  Lab 01/04/19 1646 01/04/19 1916 01/04/19 2311 01/05/19 0336 01/05/19 0746  GLUCAP 105* 97 84 76 76     Critical care App  time: 32 minutes    Nickolas Madrid, NP 01/05/2019  9:15 AM Pager: [phone] or [phone]

## 2019-01-05 NOTE — Progress Notes (Signed)
PCCM Interval Progress Note  Called to place arterial line after RT had difficulty obtaining ABG. Small femoral arteries noted.  Attempted to place line. Vessel entered on first attempt; however, unable to pass guidewire due to resistance and wire kinking.  Attempted multiple manuevers; however, was unsuccessful. Left femoral artery assessed and smaller than right; therefore, attempts at placing line there was deferred.  RN informed me that pt was on quad strength levophed infusing through PIV.  Also informed that nephrology is considering CRRT in AM if no improvement in renal function.  With this in mind, HD catheter was placed in right femoral vein.  Deferred attempts at IJ as felt that it was unsafe given that pt was flailing in bed randomly and was difficult to re-orient.   Montey Hora, [facility] Pager: [phone].  If no answer, [phone] 01/05/2019, 11:22 PM

## 2019-01-05 NOTE — Progress Notes (Addendum)
eLink Physician-Brief Progress Note Patient Name: Kendra Todd DOB: Jun 06, 1941 MRN: [id number]   Date of Service  01/05/2019  HPI/Events of Note  Cal low. AST elevated > 900.  eICU Interventions  Looks like from shock liver/congestive hepatopathy. Follow AST level in AM consider GI help. Earlier US liver/RUQ. No acute findings. Asp precautions. Replace calcium gluconate.      Intervention Category Intermediate Interventions: Electrolyte abnormality - evaluation and management  Jeffie Widdowson [name] 01/05/2019, 6:00 AM   610 AM BP down again. Talked/notified NP. Has 2 2- G line. Ok to order Neo synephrine low dose for now. Avoid extravasation. Consider central line if dose goes up

## 2019-01-05 NOTE — Plan of Care (Signed)
°  Problem: Respiratory: °Goal: Ability to maintain adequate ventilation will improve °Outcome: Progressing °  °

## 2019-01-05 NOTE — Progress Notes (Signed)
eLink Physician-Brief Progress Note Patient Name: Kendra Todd DOB: 1941/03/22 MRN: [id number]   Date of Service  01/05/2019  HPI/Events of Note  Soft MAP. Having low UOP for last 2 days. + fluid balance. Camera: On nasal o2 2 lit. No sob. HR 110 a fib. Amiodarone gtt. MAP now 74, SBP 89. Cr > 3. K ok. Hg stable. On abx for PNA.   eICU Interventions  NS 250 ml bolus. Consider Nephrology eval in AM if not better. Has AGMA with bicarb at 36. Watch for CHF. Fluid overload to lungs.  Low thresh hold for intubation if worsens.      Intervention Category Intermediate Interventions: Hypotension - evaluation and management Minor Interventions: Communication with other healthcare providers and/or family  Elmer Sow 01/05/2019, 3:47 AM

## 2019-01-05 NOTE — Progress Notes (Addendum)
eLink Physician-Brief Progress Note Patient Name: Kendra Todd DOB: Mar 06, 1941 MRN: [id number]   Date of Service  01/05/2019  HPI/Events of Note  Pt noted to be confused at change of shift.  Pt is not able to follow commands, not able to say her name.   CBG 70.    Pt with significant metabolic acidosis.  No renal recovery yet.  Pt on levophed, HCO3 gtt and amiodarone.   She has risk of CVA given atrial fibrillation, not on anticoagulation.  She has several reasons for encephalopathy - metabolic and sepsis, hypoglycemia.  eICU Interventions  Give D50.  Check ABG.  Discussed the plan with the family at bedside.      Intervention Category Intermediate Interventions: Change in mental status - evaluation and management  [name] 01/05/2019, 7:44 PM   1:24 AM  Pt remains confused.  ABG was difficult to obtain.  Arterial line was attempted without success.  Pt also has continued increased pressor requirement.   Plan> Check venous pH.  Continue levophed.  Start on vasopressin.  Replete K.   2:14 AM Venous pH 7.07 only despite HCO3 gtt @ 162ml/hr.  Plan> Increase HCO3 gtt @ 125ml/hr. Start vasopressin to keep MAP >65.   3:56 AM  Informed of abnormal labs.  H&H 6.1/20.2 Lactic acid 2.9  Plan> Transfuse 1 unit pRBC. Continue HCO3 gtt.  Continue levophed and vasopressin to keep MAP >65.   Continue to trend lactic acid.

## 2019-01-05 NOTE — NC FL2 (Addendum)
[place] MEDICAID FL2 LEVEL OF CARE SCREENING TOOL     IDENTIFICATION  Patient Name: Kendra Todd Birthdate: 21-Sep-1941 Sex: female Admission Date (Current Location): 12/09/2018  [facility] and [place] Number:  Herbalist and Address:  [facility], [place] [address] [place], [place] [address]      Provider Number: [id number]  Attending Physician Name and Address:  Rigoberto Noel, MD  Relative Name and Phone Number:       Current Level of Care: Hospital Recommended Level of Care: [place] Prior Approval Number:    Date Approved/Denied:   PASRR Number: [id number]  Discharge Plan: SNF    Current Diagnoses: Patient Active Problem List   Diagnosis Date Noted  . Idiopathic hypotension   . Acute respiratory failure with hypoxia ([place]) 12/31/2018  . CAP (community acquired pneumonia) 12/11/2018  . Sepsis ([place]) 12/18/2018  . Viral gastroenteritis 12/15/2018  . AKI (acute kidney injury) ([place]) 01/02/2019  . Chest pain, rule out acute myocardial infarction 09/08/2018  . History of stent insertion of renal artery 09/04/2018  . Helicobacter pylori gastritis 09/04/2018  . Candidal esophagitis ([place]) 09/04/2018  . Abdominal pain   . H. pylori infection   . Nausea & vomiting 08/23/2018  . Orthostatic hypotension 08/23/2018  . LLQ abdominal pain 08/06/2018  . Long term (current) use of antithrombotics/antiplatelets 08/06/2018  . Other dysphagia 08/06/2018  . Other fatigue 07/10/2018  . Melena 07/10/2018  . Labile blood pressure 07/10/2018  . Renovascular hypertension 06/23/2018  . Pulmonary artery hypertension ([place]) 06/23/2018  . Aortic mural thrombus ([place]) 02/13/2018  . Thoracic back pain 02/05/2018  . Hypertensive urgency 02/03/2018  . Chest pain 02/03/2018  . Anemia 01/20/2018  . Sacroiliitis ([place]) 04/10/2017  . Pruritus 04/10/2017  . Edema 12/12/2016  . Chronic diastolic CHF (congestive heart failure), NYHA class 2 ([place])  09/25/2016  . Thoracoabdominal aortic aneurysm (TAAA) without rupture ([place]) 02/27/2016  . CKD (chronic kidney disease) stage 3, GFR 30-59 ml/min ([facility]) 02/27/2016  . Herpes zoster 05/25/2015  . Superficial vein thrombosis 05/11/2015  . Back pain 10/26/2014  . Osteoarthritis 10/26/2014  . AAA (abdominal aortic aneurysm) without rupture ([place]) 07/21/2014  . Pain in joint, shoulder region 01/20/2014  . Bilateral renal artery stenosis ([place]) 10/22/2013  . Palpitations 06/03/2013  . Atrial fibrillation ([place]) 04/25/2013  . TR (tricuspid regurgitation), severe 12/02/12 for repair with CABG 12/03/2012  . Pulmonary HTN ([place]) 12/03/2012  . NSTEMI (non-ST elevated myocardial infarction) ([place]) 11/27/2012  . PAD (peripheral artery disease) ([place]) 11/27/2012  . CAD (coronary artery disease): 1993 PTCA to circumflex.  2002 2 BMS stents to ostial, S/P CABG x 3 12/04/12 11/27/2012  . DM (diabetes mellitus), type 2 with renal complications ([place]) 19/62/2297  . HTN (hypertension) 11/27/2012  . Hypercholesterolemia 11/27/2012  . Bilateral carotid artery stenosis 02/14/2012    Orientation RESPIRATION BLADDER Height & Weight     Self, Place, Situation  O2(Nasal Cannula 2L) Continent Weight: 140 lb 6.9 oz (63.7 kg) Height:  5\' 2"  (157.5 cm)  BEHAVIORAL SYMPTOMS/MOOD NEUROLOGICAL BOWEL NUTRITION STATUS      Continent Diet(NPO at this time, please check d/c summary for diet, subject to change)  AMBULATORY STATUS COMMUNICATION OF NEEDS Skin   Extensive Assist Verbally                         Personal Care Assistance Level of Assistance  Bathing, Feeding, Dressing Bathing Assistance: Maximum assistance Feeding assistance:  Limited assistance Dressing Assistance: Maximum assistance     Functional Limitations Info  Sight, Hearing, Speech Sight Info: Adequate Hearing Info: Adequate Speech Info: Adequate    SPECIAL CARE FACTORS FREQUENCY  PT (By licensed PT), OT (By licensed OT)   PT Frequency: 2x  week OT Frequency: 2x week                Contractures Contractures Info: Not present    Additional Factors Info  Code Status, Allergies Code Status Info: Full Code Allergies Info: Iron, Vicodin Hydrocodone-acetaminophen, Amiodarone, Norvasc Amlodipine Besylate, Promethazine, Vioxx Rofecoxib           Current Medications (01/05/2019):  This is the current hospital active medication list Current Facility-Administered Medications  Medication Dose Route Frequency Provider Last Rate Last Dose  . 0.9 %  sodium chloride infusion  250 mL Intravenous Continuous Elmer Sow, MD      . acetaminophen (TYLENOL) tablet 650 mg  650 mg Oral Q6H PRN Shela Leff, MD   650 mg at 01/03/19 2015   Or  . acetaminophen (TYLENOL) suppository 650 mg  650 mg Rectal Q6H PRN Shela Leff, MD      . albumin human 5 % solution 25 g  25 g Intravenous Q6H Rosita Fire, MD      . amiodarone (NEXTERONE PREMIX) 360-4.14 MG/200ML-% (1.8 mg/mL) IV infusion  30 mg/hr Intravenous Continuous Tawni Millers, MD 16.6 mL/hr at 01/05/19 1000 29.88 mg/hr at 01/05/19 1000  . benzonatate (TESSALON) capsule 100 mg  100 mg Oral TID PRN Shela Leff, MD   100 mg at 01/01/19 1149  . cefTRIAXone (ROCEPHIN) 2 g in sodium chloride 0.9 % 100 mL IVPB  2 g Intravenous Q24H Shela Leff, MD   Stopped at 01/03/19 1830  . feeding supplement (ENSURE ENLIVE) (ENSURE ENLIVE) liquid 237 mL  237 mL Oral BID BM Arrien, Jimmy Picket, MD   237 mL at 01/04/19 1316  . heparin injection 5,000 Units  5,000 Units Subcutaneous Q8H Shela Leff, MD   5,000 Units at 01/05/19 0503  . insulin aspart (novoLOG) injection 0-15 Units  0-15 Units Subcutaneous Q4H Whiteheart, Kathryn A, NP      . ipratropium-albuterol (DUONEB) 0.5-2.5 (3) MG/3ML nebulizer solution 3 mL  3 mL Nebulization TID Eliseo Squires, Jessica U, DO   3 mL at 01/05/19 1401  . latanoprost (XALATAN) 0.005 % ophthalmic solution 1 drop  1 drop Both  Eyes QHS Shela Leff, MD   1 drop at 01/05/19 0002  . metroNIDAZOLE (FLAGYL) IVPB 500 mg  500 mg Intravenous Q8H Kara Mead V, MD      . norepinephrine (LEVOPHED) 4mg  in 218mL premix infusion  2-10 mcg/min Intravenous Titrated Cecilie Lowers T, MD 37.5 mL/hr at 01/05/19 1455 10 mcg/min at 01/05/19 1455  . ondansetron (ZOFRAN) injection 4 mg  4 mg Intravenous Q6H PRN Shela Leff, MD   4 mg at 01/03/19 1750  . pantoprazole (PROTONIX) injection 40 mg  40 mg Intravenous Q24H Kara Mead V, MD      . phenol (CHLORASEPTIC) mouth spray 1 spray  1 spray Mouth/Throat PRN Shela Leff, MD   1 spray at 01/01/19 1144  . phenylephrine (NEOSYNEPHRINE) 10-0.9 MG/250ML-% infusion           . sodium bicarbonate 150 mEq in sterile water 1,000 mL infusion   Intravenous Continuous Rigoberto Noel, MD 100 mL/hr at 01/05/19 1317       Discharge Medications: Please see discharge summary for a list of  discharge medications.  Relevant Imaging Results:  Relevant Lab Results:   Additional Information SSN: [id number]  Eileen Stanford, LCSW

## 2019-01-05 NOTE — Progress Notes (Signed)
Patient has had no urine output with foley catheter in place. Bladder scan revealed 0cc. Dr Elsworth Soho was made aware. No new orders. Will re-bladder scan in a few hours. Patient currently also has two IV's and has levophed infusing along with amiodarone and other IV fluids. Questioned possible central line placement with the need for a vasopressor at this time. Will hold off for today and patient may need an HD catheter placed tomorrow. Will continue to monitor.

## 2019-01-05 NOTE — Consult Note (Addendum)
[facility] Nephrology Consultation Note  Requesting MD: Dr June Leap (PCCM) Reason for consult: AKI  HPI:  Kendra Todd is a 78 y.o. female.  With history of diabetes, hypertension, CAD status post stent and CABG, dyslipidemia, thoracoabdominal aortic aneurysm, PAD, CKD stage III with baseline serum creatinine level around 1.5-1.8, admitted on 12/11/2018 for right lower lobe pneumonia, sepsis and hypotension.  Patient was treated with IV fluid and antibiotics.  On 3/1 patient developed A. fib with RVR when she was started on amiodarone and transferred to ICU for further management.  The serum creatinine level was 2.21 on admission.  Creatinine level gradually increased to 4.4 today associated with CO2 13, sodium 129, potassium 4.3.  Urine  output was marginal since admission and only recorded 50 cc in 24 hours for last 2 days.  Urinalysis with no RBC however has rare bacteria, WBC and protein 100.  No IV contrast use.  The ultrasound abdomen done on 01/04/2019 showed bilateral increased cortical echogenicity with no obstruction.  The CT scan done today reported as mild atrophy of left kidney which is a stable from prior exam, focal colitis in ascending colon and cecum, stable aortic aneurysm with penetrating ulcer just below the right renal artery.  In ICU patient has been receiving Levophed for septic shock.  Started on IV albumin and sodium bicarbonate IVF for intravascular volume depletion.  On vancomycin and ceftriaxone for sepsis.  Patient is alert awake and following commands.  She has very dry mucous membrane and had some encephalopathy.  Creat  Date/Time Value Ref Range Status  10/20/2018 08:19 AM 1.15 (H) 0.60 - 0.93 mg/dL Final    Comment:    For patients >43 years of age, the reference limit for Creatinine is approximately 13% higher for people identified as African-American. .   09/04/2018 12:34 PM 1.89 (H) 0.60 - 0.93 mg/dL Final    Comment:    For patients >59  years of age, the reference limit for Creatinine is approximately 13% higher for people identified as African-American. [name]   06/03/2018 08:22 AM 1.57 (H) 0.60 - 0.93 mg/dL Final    Comment:    For patients >10 years of age, the reference limit for Creatinine is approximately 13% higher for people identified as African-American. [name]   12/24/2017 08:24 AM 1.54 (H) 0.60 - 0.93 mg/dL Final    Comment:    For patients >43 years of age, the reference limit for Creatinine is approximately 13% higher for people identified as African-American. .   04/15/2017 09:06 AM 1.38 (H) 0.60 - 0.93 mg/dL Final    Comment:      For patients > or = 80 years of age: The upper reference limit for Creatinine is approximately 13% higher for people identified as African-American.     12/07/2016 12:01 AM 1.32 (H) 0.60 - 0.93 mg/dL Final    Comment:      For patients > or = 80 years of age: The upper reference limit for Creatinine is approximately 13% higher for people identified as African-American.     09/25/2016 10:59 AM 2.64 (H) 0.60 - 0.93 mg/dL Final    Comment:      For patients > or = 80 years of age: The upper reference limit for Creatinine is approximately 13% higher for people identified as African-American.     09/11/2016 10:17 AM 1.98 (H) 0.60 - 0.93 mg/dL Final    Comment:      For patients > or = 80  years of age: The upper reference limit for Creatinine is approximately 13% higher for people identified as African-American.     08/06/2016 09:50 AM 1.59 (H) 0.60 - 0.93 mg/dL Final    Comment:      For patients > or = 80 years of age: The upper reference limit for Creatinine is approximately 13% higher for people identified as African-American.     11/18/2015 10:55 AM 1.57 (H) 0.60 - 0.93 mg/dL Final  11/10/2015 10:12 AM 1.31 (H) 0.60 - 0.93 mg/dL Final  11/03/2015 10:59 AM 1.43 (H) 0.60 - 0.93 mg/dL Final  03/09/2014 08:45 AM 1.21 (H) 0.50 - 1.10 mg/dL Final   Creatinine,  IStat  Date/Time Value Ref Range Status  01/13/2016 09:43 AM 1.6 (H) 0.6 - 1.3 mg/dL Final  07/27/2015 12:00 PM 1.5 (H) 0.6 - 1.3 mg/dL Final   Creatinine, Ser  Date/Time Value Ref Range Status  01/05/2019 03:58 AM 4.44 (H) 0.44 - 1.00 mg/dL Final  01/04/2019 05:46 AM 3.90 (H) 0.44 - 1.00 mg/dL Final  01/03/2019 11:42 PM 3.76 (H) 0.44 - 1.00 mg/dL Final  01/03/2019 03:19 AM 3.09 (H) 0.44 - 1.00 mg/dL Final  01/02/2019 03:11 AM 2.57 (H) 0.44 - 1.00 mg/dL Final  01/01/2019 03:35 AM 2.35 (H) 0.44 - 1.00 mg/dL Final  12/31/2018 04:01 AM 2.04 (H) 0.44 - 1.00 mg/dL Final  01/02/2019 05:08 PM 2.21 (H) 0.44 - 1.00 mg/dL Final  09/07/2018 11:01 PM 1.63 (H) 0.44 - 1.00 mg/dL Final  08/28/2018 03:43 AM 1.49 (H) 0.44 - 1.00 mg/dL Final  08/27/2018 03:35 AM 1.52 (H) 0.44 - 1.00 mg/dL Final  08/26/2018 05:32 AM 1.41 (H) 0.44 - 1.00 mg/dL Final  08/25/2018 05:30 AM 1.45 (H) 0.44 - 1.00 mg/dL Final  08/24/2018 05:31 AM 1.65 (H) 0.44 - 1.00 mg/dL Final  08/23/2018 06:08 AM 2.03 (H) 0.44 - 1.00 mg/dL Final  08/22/2018 11:19 PM 2.29 (H) 0.44 - 1.00 mg/dL Final  07/22/2018 02:35 AM 1.26 (H) 0.44 - 1.00 mg/dL Final  07/15/2018 12:17 PM 1.52 (H) 0.57 - 1.00 mg/dL Final  02/03/2018 03:55 AM 1.52 (H) 0.44 - 1.00 mg/dL Final  02/02/2018 08:24 PM 1.46 (H) 0.44 - 1.00 mg/dL Final  02/06/2016 08:31 AM 1.81 (H) 0.57 - 1.00 mg/dL Final  09/27/2015 08:22 AM 1.23 (H) 0.57 - 1.00 mg/dL Final  05/06/2015 08:28 AM 1.39 (H) 0.57 - 1.00 mg/dL Final  15-Jan-202016 08:10 AM 1.61 (H) 0.57 - 1.00 mg/dL Final  07/23/2014 08:54 AM 1.52 (H) 0.57 - 1.00 mg/dL Final  07/09/2014 09:37 AM 1.00 0.50 - 1.10 mg/dL Final  01/18/2014 08:14 AM 1.04 (H) 0.57 - 1.00 mg/dL Final  11/30/2013 06:31 AM 1.10 0.50 - 1.10 mg/dL Final  09/21/2013 09:40 AM 1.28 (H) 0.57 - 1.00 mg/dL Final  06/01/2013 08:53 AM 1.03 (H) 0.57 - 1.00 mg/dL Final  03/02/2013 11:49 AM 1.23 (H) 0.57 - 1.00 mg/dL Final  12/11/2012 05:40 AM 1.12 (H) 0.50 - 1.10 mg/dL  Final  12/10/2012 05:18 AM 1.15 (H) 0.50 - 1.10 mg/dL Final  12/09/2012 05:35 AM 1.28 (H) 0.50 - 1.10 mg/dL Final  12/08/2012 04:28 AM 1.11 (H) 0.50 - 1.10 mg/dL Final  12/07/2012 03:50 AM 1.17 (H) 0.50 - 1.10 mg/dL Final  12/06/2012 04:26 AM 1.12 (H) 0.50 - 1.10 mg/dL Final  12/05/2012 04:28 PM 1.10 0.50 - 1.10 mg/dL Final  12/05/2012 04:00 PM 1.04 0.50 - 1.10 mg/dL Final  12/05/2012 03:20 AM 0.77 0.50 - 1.10 mg/dL Final  12/04/2012 07:15 PM 0.67 0.50 - 1.10 mg/dL  Final  12/04/2012 07:05 PM 0.60 0.50 - 1.10 mg/dL Final  12/03/2012 03:34 PM 0.82 0.50 - 1.10 mg/dL Final  12/03/2012 05:05 AM 0.84 0.50 - 1.10 mg/dL Final  12/02/2012 04:35 AM 0.82 0.50 - 1.10 mg/dL Final  11/30/2012 07:15 AM 0.82 0.50 - 1.10 mg/dL Final  11/29/2012 08:20 AM 0.86 0.50 - 1.10 mg/dL Final  11/28/2012 09:20 AM 0.88 0.50 - 1.10 mg/dL Final  11/27/2012 05:16 PM 1.06 0.50 - 1.10 mg/dL Final  03/30/2011 06:27 AM 1.30 (H) 0.4 - 1.2 mg/dL Final  01/25/2011 04:36 AM 1.03 0.4 - 1.2 mg/dL Final  01/24/2011 03:40 AM 1.04 0.4 - 1.2 mg/dL Final     PMHx:   Past Medical History:  Diagnosis Date  . Abdominal pain, epigastric   . Acute upper respiratory infections of unspecified site   . Arthritis   . Benign paroxysmal positional vertigo   . CAD (coronary artery disease)    prior coronary stenting  . Candidiasis of vulva and vagina   . Carotid artery disease ([place]) 2001   s/p Bilateral CEA, after CVA  . Cervicalgia   . Decreased pedal pulses 06/04/2011   doppler - bilateral ABIs no evidence of insufficiency; L CIA slightly elevated velocities 20-30% diameter reduction;   . Dyspnea 03/13/2006   Myoview - EF 76%; mild ischemia in apical lateral region, less severe than previous study in 2002  . Edema   . H/O carotid endarterectomy 03/06/2012   CT angio -high grade stenosis of proximal R internal carotid w/ lg posterior ulceration or dissection flap; near occlusive stenosis at origin of nondominantproximal L vertebral  artery; <50% stenosis of proximal R vertebral and proximal L subclavian artery  . H/O endarterectomy 09/11/2012   patent L and R carotid sites, R 60-79% restenosed ICA; L <40% restenosed ICA  . H/O endarterectomy 01/08/2012   doppler - R distal common carotid/proximal ICA stenosed 80-99%, L 0-39%  . HTN (hypertension) 11/27/2012  . Hyperlipidemia   . Limb pain 05/02/2011   doppler - no evidence of thrombus of thrombophlebitis  . Long term (current) use of anticoagulants   . Loss of weight   . Lumbago   . Myalgia and myositis, unspecified   . Myocardial infarction ([place])   . Nonspecific abnormal results of liver function study   . Nontoxic uninodular goiter   . Osteoarthrosis, unspecified whether generalized or localized, unspecified site   . Other malaise and fatigue   . Other manifestations of vitamin A deficiency   . PAF (paroxysmal atrial fibrillation) ([place]) 02/21/2011  . Pain in joint, pelvic region and thigh   . Pain in limb   . Peripheral vascular disease ([place])   . Phlebitis and thrombophlebitis of superficial vessels of lower extremities   . Primary localized osteoarthrosis, hand   . Pulmonary HTN ([place]) 12/03/2012  . Renal artery stenosis ([place])   . Routine gynecological examination   . Spinal stenosis, unspecified region other than cervical   . Stenosis of artery ([place]) 06/03/2012   doppler- most distal aspect of abd aorta no aneurysmal dilation; bilateral ABIs - mild L arterial insufficiency, L CIA narrowing w/ 50-69% diameter reduction; L and R SFA both w/ 0-49% diameter reductions  . Stroke ([place]) 2001  . Superficial vein thrombosis 05/11/2015   Left forehead  . Tobacco use disorder   . TR (tricuspid regurgitation), severe 12/02/12 for repair with CABG 12/03/2012  . Type II or unspecified type diabetes mellitus with peripheral circulatory disorders, not stated as uncontrolled(250.70)   .  Unspecified cataract   . Unspecified disorder of iris and ciliary body   . Unspecified vitamin  D deficiency   . Urinary tract infection, site not specified     Past Surgical History:  Procedure Laterality Date  . ABDOMINAL HYSTERECTOMY  1975  . ANGIOPLASTY  1984 and 1982  . APPENDECTOMY  1954  . ARCH AORTOGRAM  03/30/2011   40% recurrent stenosis at origin of R proximal carotid patch, shelf like recurrent stenosis w/in midsection of patch that does not appear to be flow limiting; normal non-stenosed great vessel origins  . BACK SURGERY  '84,'86, '87   three previous surgeries  . CAROTID ENDARTERECTOMY  2001   Bilateral  . CORONARY ARTERY BYPASS GRAFT  12/04/2012   Procedure: CORONARY ARTERY BYPASS GRAFTING (CABG);  Surgeon: Ivin Poot, MD; LIMA-LAD, SVG-OM, SVG-RCA  . ESOPHAGOGASTRODUODENOSCOPY (EGD) WITH PROPOFOL N/A 08/27/2018   Procedure: ESOPHAGOGASTRODUODENOSCOPY (EGD) WITH PROPOFOL;  Surgeon: Mauri Pole, MD;  Location: [facility];  Service: Endoscopy;  Laterality: N/A;  . INTRAOPERATIVE TRANSESOPHAGEAL ECHOCARDIOGRAM  12/04/2012   Procedure: INTRAOPERATIVE TRANSESOPHAGEAL ECHOCARDIOGRAM;  Surgeon: Ivin Poot, MD;  Location: [facility];  Service: Open Heart Surgery;  Laterality: N/A;  . KIDNEY STONE SURGERY     Removal  . LEFT HEART CATHETERIZATION WITH CORONARY ANGIOGRAM N/A 11/28/2012   Procedure: LEFT HEART CATHETERIZATION WITH CORONARY ANGIOGRAM;  Surgeon: Leonie Man, MD;  Location: [facility] CATH LAB;  Service: Cardiovascular;  Laterality: N/A;  . PERCUTANEOUS CORONARY STENT INTERVENTION (PCI-S)  2002   2 BMS in ostial/Prox & mid RCA (mid 2.5 mm 20x  mm, & 2.75 mm x  8 mm ostial)  . PERIPHERAL VASCULAR INTERVENTION  07/21/2018   Procedure: PERIPHERAL VASCULAR INTERVENTION;  Surgeon: Lorretta Harp, MD;  Location: [facility] CV LAB;  Service: Cardiovascular;;  bilateral renal stents  . RENAL ANGIOGRAM N/A 11/30/2013   Procedure: RENAL ANGIOGRAM;  Surgeon: Lorretta Harp, MD;  Location: [facility] CATH LAB;  Service: Cardiovascular;  Laterality: N/A;  . RENAL  ANGIOGRAPHY N/A 07/21/2018   Procedure: RENAL ANGIOGRAPHY;  Surgeon: Lorretta Harp, MD;  Location: [facility] CV LAB;  Service: Cardiovascular;  Laterality: N/A;  . RIGHT HEART CATHETERIZATION N/A 12/02/2012   Procedure: RIGHT HEART CATH;  Surgeon: Troy Sine, MD;  Location[facility] C[facility] LAB;  Service: Cardiovascular;  Laterality: N/A;  . TEE WITH CARDIOVERSION  01/23/2011   EF 22-48%; grade 2 diastolic dysfunction, elevated mean LA filling pressure, RV systolic pressure increased consistent w/ mod pulmonary hypertension    Family Hx:  Family History  Problem Relation Age of Onset  . Other Mother        CVA  . Diabetes Mother   . Heart disease Mother        Heart disease before age 82  . Heart attack Mother   . Heart disease Father        Heart Disease before age 64  . Heart attack Father   . Heart disease Sister        Amputation  . Diabetes Sister   . Heart disease Brother        Heart disease before age 31  . Diabetes Brother   . Stroke Brother   . Stroke Brother   . Heart disease Brother   . Stroke Brother   . Heart disease Brother   . Heart disease Sister   . Heart disease Sister   . Diabetes Daughter   . Cancer Sister   .  Diabetes Other   . Cancer Other        breast  . Heart disease Other        cad  . Other Other        cva  . Colon cancer Neg Hx   . Esophageal cancer Neg Hx   . Inflammatory bowel disease Neg Hx   . Liver disease Neg Hx   . Pancreatic cancer Neg Hx   . Rectal cancer Neg Hx   . Stomach cancer Neg Hx     Social History:  reports that she quit smoking about 6 years ago. Her smoking use included cigarettes. She has a 17.10 pack-year smoking history. She has never used smokeless tobacco. She reports current alcohol use. She reports that she does not use drugs.  Allergies:  Allergies  Allergen Reactions  . Iron Hives  . Vicodin [Hydrocodone-Acetaminophen] Other (See Comments)    Caused arrythmia   . Amiodarone Nausea And Vomiting  .  Norvasc [Amlodipine Besylate] Nausea And Vomiting  . Promethazine Other (See Comments)    hallucinations  . Vioxx [Rofecoxib] Nausea And Vomiting    Medications: Prior to Admission medications   Medication Sig Start Date End Date Taking? Authorizing Provider  alendronate (FOSAMAX) 70 MG tablet Take 70 mg by mouth every Friday.  07/13/16  Yes [provider]  bimatoprost (LUMIGAN) 0.01 % SOLN Place 1 drop into both eyes at bedtime.   Yes [provider]  carvedilol (COREG) 12.5 MG tablet Take 1 tablet (12.5 mg total) by mouth 2 (two) times daily with a meal. 09/09/18  Yes Black, Lezlie Octave, NP  ezetimibe (ZETIA) 10 MG tablet Take 10 mg by mouth at bedtime.    Yes [provider]  metFORMIN (GLUCOPHAGE) 1000 MG tablet Take 1 tablet (1,000 mg total) by mouth 2 (two) times daily with a meal. 12/15/18  Yes Reed, Tiffany L, DO  nitroGLYCERIN (NITROSTAT) 0.4 MG SL tablet Place 1 tablet (0.4 mg total) under the tongue every 5 (five) minutes as needed for chest pain. 01/31/18  Yes Croitoru, Mihai, MD  rosuvastatin (CRESTOR) 40 MG tablet TAKE 1 TABLET DAILY FOR CHOLESTEROL Patient taking differently: Take 40 mg by mouth daily.  12/09/18  Yes Reed, Tiffany L, DO  glucose blood (ONETOUCH VERIO) test strip Use as instructed to test blood sugar twice daily DX: E11.29 09/04/18   Reed, Tiffany L, DO    I have reviewed the patient's current medications.  Labs:  Results for orders placed or performed during the hospital encounter of 12/17/2018 (from the past 48 hour(s))  Glucose, capillary     Status: None   Collection Time: 01/03/19  5:18 PM  Result Value Ref Range   Glucose-Capillary 97 70 - 99 mg/dL  Glucose, capillary     Status: None   Collection Time: 01/03/19 11:28 PM  Result Value Ref Range   Glucose-Capillary 84 70 - 99 mg/dL  CBC     Status: Abnormal   Collection Time: 01/03/19 11:42 PM  Result Value Ref Range   WBC 27.6 (H) 4.0 - 10.5 K/uL   RBC 2.88 (L) 3.87 - 5.11 MIL/uL    Hemoglobin 7.9 (L) 12.0 - 15.0 g/dL   HCT 25.4 (L) 36.0 - 46.0 %   MCV 88.2 80.0 - 100.0 fL   MCH 27.4 26.0 - 34.0 pg   MCHC 31.1 30.0 - 36.0 g/dL   RDW 17.2 (H) 11.5 - 15.5 %   Platelets 170 150 - 400 K/uL  nRBC 0.1 0.0 - 0.2 %    Comment: Performed at [facility], [place] [address] [place], [place] [address]  Basic metabolic panel     Status: Abnormal   Collection Time: 01/03/19 11:42 PM  Result Value Ref Range   Sodium 134 (L) 135 - 145 mmol/L   Potassium 4.2 3.5 - 5.1 mmol/L   Chloride 109 98 - 111 mmol/L   CO2 13 (L) 22 - 32 mmol/L   Glucose, Bld 93 70 - 99 mg/dL   BUN 52 (H) 8 - 23 mg/dL   Creatinine, Ser 3.76 (H) 0.44 - 1.00 mg/dL   Calcium 6.8 (L) 8.9 - 10.3 mg/dL   GFR calc non Af Amer 11 (L) >60 mL/min   GFR calc Af Amer 13 (L) >60 mL/min   Anion gap 12 5 - 15    Comment: Performed at [facility] [address], [place], [place] [address]  Lactic acid, plasma     Status: None   Collection Time: 01/03/19 11:47 PM  Result Value Ref Range   Lactic Acid, Venous 1.1 0.5 - 1.9 mmol/L    Comment: Performed at [facility] [address], [place], [place] [address]  Blood gas, arterial     Status: Abnormal   Collection Time: 01/04/19 12:55 AM  Result Value Ref Range   O2 Content 2.0 L/min   Delivery systems NASAL CANNULA    pH, Arterial 7.318 (L) 7.350 - 7.450   pCO2 arterial 27.5 (L) 32.0 - 48.0 mmHg   pO2, Arterial 85.8 83.0 - 108.0 mmHg   Bicarbonate 13.8 (L) 20.0 - 28.0 mmol/L   Acid-base deficit 11.2 (H) 0.0 - 2.0 mmol/L   O2 Saturation 96.7 %   Patient temperature 98.1    Collection site LEFT RADIAL    Drawn by [id number]    Sample type ARTERIAL DRAW    Allens test (pass/fail) PASS PASS  CBC with Differential/Platelet     Status: Abnormal   Collection Time: 01/04/19  5:46 AM  Result Value Ref Range   WBC 30.5 (H) 4.0 - 10.5 K/uL   RBC 2.95 (L) 3.87 - 5.11 MIL/uL   Hemoglobin 7.9 (L) 12.0 - 15.0 g/dL   HCT 25.8 (L) 36.0 - 46.0 %   MCV  87.5 80.0 - 100.0 fL   MCH 26.8 26.0 - 34.0 pg   MCHC 30.6 30.0 - 36.0 g/dL   RDW 17.3 (H) 11.5 - 15.5 %   Platelets 180 150 - 400 K/uL   nRBC 0.1 0.0 - 0.2 %   Neutrophils Relative % 76 %   Neutro Abs 29.9 (H) 1.7 - 7.7 K/uL   Band Neutrophils 22 %   Lymphocytes Relative 2 %   Lymphs Abs 0.6 (L) 0.7 - 4.0 K/uL   Monocytes Relative 0 %   Monocytes Absolute 0.0 (L) 0.1 - 1.0 K/uL   Eosinophils Relative 0 %   Eosinophils Absolute 0.0 0.0 - 0.5 K/uL   Basophils Relative 0 %   Basophils Absolute 0.0 0.0 - 0.1 K/uL   WBC Morphology See Note     Comment: Increased Bands. >20% Bands   nRBC 0 0 /100 WBC   Abs Immature Granulocytes 0.00 0.00 - 0.07 K/uL   Burr Cells PRESENT     Comment: Performed at [facility], [place] [address] [place], [place] [address]  Basic metabolic panel     Status: Abnormal   Collection Time: 01/04/19  5:46 AM  Result Value Ref Range   Sodium 136 135 - 145 mmol/L   Potassium 4.3 3.5 - 5.1 mmol/L   Chloride 107 98 - 111 mmol/L   CO2 17 (L) 22 - 32 mmol/L   Glucose, Bld 81 70 - 99 mg/dL   BUN 51 (H) 8 - 23 mg/dL   Creatinine, Ser 3.90 (H) 0.44 - 1.00 mg/dL   Calcium 6.6 (L) 8.9 - 10.3 mg/dL   GFR calc non Af Amer 10 (L) >60 mL/min   GFR calc Af Amer 12 (L) >60 mL/min   Anion gap 12 5 - 15    Comment: Performed at [facility] [address] [place], [place] [address]  Lactic acid, plasma     Status: None   Collection Time: 01/04/19  5:46 AM  Result Value Ref Range   Lactic Acid, Venous 0.9 0.5 - 1.9 mmol/L    Comment: Performed at [facility] [address] [place], [place] [address]  Glucose, capillary     Status: None   Collection Time: 01/04/19  7:29 AM  Result Value Ref Range   Glucose-Capillary 78 70 - 99 mg/dL  Culture, blood (routine x 2)     Status: None (Preliminary result)   Collection Time: 01/04/19  9:14 AM  Result Value Ref Range   Specimen Description BLOOD RIGHT WRIST    Special Requests      AEROBIC BOTTLE ONLY  Blood Culture adequate volume Performed at [facility], [place] [address] [place], [place] [address]    Culture NO GROWTH < 24 HOURS    Report Status PENDING   Culture, blood (routine x 2)     Status: None (Preliminary result)   Collection Time: 01/04/19 10:23 AM  Result Value Ref Range   Specimen Description BLOOD RIGHT HAND    Special Requests      AEROBIC BOTTLE ONLY Blood Culture adequate volume Performed at [facility] [address], [place], [place] [address]    Culture NO GROWTH < 24 HOURS    Report Status PENDING   Glucose, capillary     Status: Abnormal   Collection Time: 01/04/19 12:01 PM  Result Value Ref Range   Glucose-Capillary 116 (H) 70 - 99 mg/dL  Glucose, capillary     Status: Abnormal   Collection Time: 01/04/19  4:46 PM  Result Value Ref Range   Glucose-Capillary 105 (H) 70 - 99 mg/dL   Comment 1 Notify RN   Procalcitonin - Baseline     Status: None   Collection Time: 01/04/19  6:24 PM  Result Value Ref Range   Procalcitonin 3.29 ng/mL    Comment:        Interpretation: PCT > 2 ng/mL: Systemic infection (sepsis) is likely, unless other causes are known. (NOTE)       Sepsis PCT Algorithm           Lower Respiratory Tract                                      Infection PCT Algorithm    ----------------------------     ----------------------------         PCT < 0.25 ng/mL                PCT < 0.10 ng/mL         Strongly encourage  Strongly discourage   discontinuation of antibiotics    initiation of antibiotics    ----------------------------     -----------------------------       PCT 0.25 - 0.50 ng/mL            PCT 0.10 - 0.25 ng/mL               OR       >80% decrease in PCT            Discourage initiation of                                            antibiotics      Encourage discontinuation           of antibiotics    ----------------------------     -----------------------------         PCT >= 0.50 ng/mL               PCT 0.26 - 0.50 ng/mL               AND       <80% decrease in PCT              Encourage initiation of                                             antibiotics       Encourage continuation           of antibiotics    ----------------------------     -----------------------------        PCT >= 0.50 ng/mL                  PCT > 0.50 ng/mL               AND         increase in PCT                  Strongly encourage                                      initiation of antibiotics    Strongly encourage escalation           of antibiotics                                     -----------------------------                                           PCT <= 0.25 ng/mL                                                 OR                                        >  80% decrease in PCT                                     Discontinue / Do not initiate                                             antibiotics Performed at [facility], [place] [address] [place], [place] [address]   Amylase     Status: Abnormal   Collection Time: 01/04/19  6:24 PM  Result Value Ref Range   Amylase 202 (H) 28 - 100 U/L    Comment: Performed at [facility], [place] [address], [place], [place] [address]  Lipase, blood     Status: Abnormal   Collection Time: 01/04/19  6:24 PM  Result Value Ref Range   Lipase 88 (H) 11 - 51 U/L    Comment: Performed at [facility] [address] [place], [place] [address]  Strep pneumoniae urinary antigen     Status: None   Collection Time: 01/04/19  6:35 PM  Result Value Ref Range   Strep Pneumo Urinary Antigen NEGATIVE NEGATIVE    Comment:        Infection due to S. pneumoniae cannot be absolutely ruled out since the antigen present may be below the detection limit of the test. Performed at [facility], [place] [address] [place], [place] [address]   Glucose, capillary     Status: None   Collection Time: 01/04/19  7:16 PM  Result Value Ref Range    Glucose-Capillary 97 70 - 99 mg/dL  Cortisol     Status: None   Collection Time: 01/04/19  8:55 PM  Result Value Ref Range   Cortisol, Plasma 31.1 ug/dL    Comment: (NOTE) AM    6.7 - 22.6 ug/dL PM   <10.0       ug/dL Performed at [facility] [address], [place], [place] [address]   Digoxin level     Status: None   Collection Time: 01/04/19  8:55 PM  Result Value Ref Range   Digoxin Level 1.6 0.8 - 2.0 ng/mL    Comment: Performed at [facility] [address], [place], [place] [address]  Glucose, capillary     Status: None   Collection Time: 01/04/19 11:11 PM  Result Value Ref Range   Glucose-Capillary 84 70 - 99 mg/dL  I-STAT 7, (LYTES, BLD GAS, ICA, H+H)     Status: Abnormal   Collection Time: 01/05/19  2:46 AM  Result Value Ref Range   pH, Arterial 7.276 (L) 7.350 - 7.450   pCO2 arterial 29.6 (L) 32.0 - 48.0 mmHg   pO2, Arterial 80.0 (L) 83.0 - 108.0 mmHg   Bicarbonate 13.9 (L) 20.0 - 28.0 mmol/L   TCO2 15 (L) 22 - 32 mmol/L   O2 Saturation 95.0 %   Acid-base deficit 12.0 (H) 0.0 - 2.0 mmol/L   Sodium 131 (L) 135 - 145 mmol/L   Potassium 4.3 3.5 - 5.1 mmol/L   Calcium, Ion 0.97 (L) 1.15 - 1.40 mmol/L   HCT 24.0 (L) 36.0 - 46.0 %   Hemoglobin 8.2 (L) 12.0 - 15.0 g/dL   Patient temperature 97.5 F    Collection site RADIAL, ALLEN'S  TEST ACCEPTABLE    Drawn by RT    Sample type ARTERIAL   Glucose, capillary     Status: None   Collection Time: 01/05/19  3:36 AM  Result Value Ref Range   Glucose-Capillary 76 70 - 99 mg/dL  CBC with Differential/Platelet     Status: Abnormal   Collection Time: 01/05/19  3:58 AM  Result Value Ref Range   WBC 36.1 (H) 4.0 - 10.5 K/uL   RBC 2.96 (L) 3.87 - 5.11 MIL/uL   Hemoglobin 8.1 (L) 12.0 - 15.0 g/dL   HCT 26.0 (L) 36.0 - 46.0 %   MCV 87.8 80.0 - 100.0 fL   MCH 27.4 26.0 - 34.0 pg   MCHC 31.2 30.0 - 36.0 g/dL   RDW 17.8 (H) 11.5 - 15.5 %   Platelets 210 150 - 400 K/uL   nRBC 0.2 0.0 - 0.2 %   Neutrophils  Relative % 94 %   Lymphocytes Relative 2 %   Monocytes Relative 4 %   Eosinophils Relative 0 %   Basophils Relative 0 %   Band Neutrophils 0 %   Metamyelocytes Relative 0 %   Myelocytes 0 %   Promyelocytes Relative 0 %   Blasts 0 %   nRBC 0 0 /100 WBC   Other 0 %   Neutro Abs 34.0 (H) 1.7 - 7.7 K/uL   Lymphs Abs 0.7 0.7 - 4.0 K/uL   Monocytes Absolute 1.4 (H) 0.1 - 1.0 K/uL   Eosinophils Absolute 0.0 0.0 - 0.5 K/uL   Basophils Absolute 0.0 0.0 - 0.1 K/uL   RBC Morphology Acanthocytes present     Comment: RARE NRBCs   WBC Morphology MILD LEFT SHIFT (1-5% METAS, OCC MYELO, OCC BANDS)     Comment: Performed at [facility], [address] [place], [place] [address]  Lactic acid, plasma     Status: None   Collection Time: 01/05/19  3:58 AM  Result Value Ref Range   Lactic Acid, Venous 1.5 0.5 - 1.9 mmol/L    Comment: Performed at [facility] [address] [place], [place] [address]  Procalcitonin     Status: None   Collection Time: 01/05/19  3:58 AM  Result Value Ref Range   Procalcitonin 4.15 ng/mL    Comment:        Interpretation: PCT > 2 ng/mL: Systemic infection (sepsis) is likely, unless other causes are known. (NOTE)       Sepsis PCT Algorithm           Lower Respiratory Tract                                      Infection PCT Algorithm    ----------------------------     ----------------------------         PCT < 0.25 ng/mL                PCT < 0.10 ng/mL         Strongly encourage             Strongly discourage   discontinuation of antibiotics    initiation of antibiotics    ----------------------------     -----------------------------       PCT 0.25 - 0.50 ng/mL            PCT 0.10 - 0.25 ng/mL  OR       >80% decrease in PCT            Discourage initiation of                                            antibiotics      Encourage discontinuation           of antibiotics    ----------------------------      -----------------------------         PCT >= 0.50 ng/mL              PCT 0.26 - 0.50 ng/mL               AND       <80% decrease in PCT              Encourage initiation of                                             antibiotics       Encourage continuation           of antibiotics    ----------------------------     -----------------------------        PCT >= 0.50 ng/mL                  PCT > 0.50 ng/mL               AND         increase in PCT                  Strongly encourage                                      initiation of antibiotics    Strongly encourage escalation           of antibiotics                                     -----------------------------                                           PCT <= 0.25 ng/mL                                                 OR                                        > 80% decrease in PCT                                     Discontinue / Do not initiate  antibiotics Performed at [facility], [place] [address] [place], [place] [address]   Comprehensive metabolic panel     Status: Abnormal   Collection Time: 01/05/19  3:58 AM  Result Value Ref Range   Sodium 129 (L) 135 - 145 mmol/L   Potassium 4.3 3.5 - 5.1 mmol/L   Chloride 101 98 - 111 mmol/L   CO2 13 (L) 22 - 32 mmol/L   Glucose, Bld 265 (H) 70 - 99 mg/dL   BUN 54 (H) 8 - 23 mg/dL   Creatinine, Ser 4.44 (H) 0.44 - 1.00 mg/dL   Calcium 6.1 (LL) 8.9 - 10.3 mg/dL    Comment: CRITICAL RESULT CALLED TO, READ BACK BY AND VERIFIED WITH: RUECKEL C,RN 01/05/19 0505 WAYK    Total Protein 5.3 (L) 6.5 - 8.1 g/dL   Albumin 1.7 (L) 3.5 - 5.0 g/dL   AST 959 (H) 15 - 41 U/L   ALT 138 (H) 0 - 44 U/L   Alkaline Phosphatase 406 (H) 38 - 126 U/L   Total Bilirubin 1.1 0.3 - 1.2 mg/dL   GFR calc non Af Amer 9 (L) >60 mL/min   GFR calc Af Amer 10 (L) >60 mL/min   Anion gap 15 5 - 15    Comment: Performed at [facility], [place] [address],  [place], [place] [address]  Magnesium     Status: None   Collection Time: 01/05/19  3:58 AM  Result Value Ref Range   Magnesium 2.0 1.7 - 2.4 mg/dL    Comment: Performed at [facility] [address] [place], [place] [address]  Brain natriuretic peptide     Status: Abnormal   Collection Time: 01/05/19  3:58 AM  Result Value Ref Range   B Natriuretic Peptide 292.2 (H) 0.0 - 100.0 pg/mL    Comment: Performed at [facility] [address] [place], [place] [address]  Glucose, capillary     Status: None   Collection Time: 01/05/19  7:46 AM  Result Value Ref Range   Glucose-Capillary 76 70 - 99 mg/dL   Comment 1 Notify RN    Comment 2 Document in Chart   Glucose, capillary     Status: None   Collection Time: 01/05/19 11:30 AM  Result Value Ref Range   Glucose-Capillary 76 70 - 99 mg/dL   Comment 1 Notify RN    Comment 2 Document in Chart      ROS:  Pertinent items noted in HPI and remainder of comprehensive ROS otherwise negative.  Physical Exam: Vitals:   01/05/19 0800 01/05/19 1200  BP:    Pulse:    Resp:    Temp: (!) 97.1 F (36.2 C) (!) 97.1 F (36.2 C)  SpO2:       General exam: Appears sick, lying in bed, not in distress HEENT: Dry and crusty mucous membrane. Respiratory system: Bibasal decreased breath sound, respiratory effort normal. Cardiovascular system: S1 & S2 heard, RRR, no rubs.  S pedal edema. Gastrointestinal system: Abdomen is nondistended, soft, tender+. Normal bowel sounds heard. Central nervous system: Alert and oriented. No focal neurological deficits. Extremities: Symmetric 5 x 5 power. Skin: No rashes, lesions or ulcers Psychiatry: appears sick    Assessment/Plan:  #Acute kidney injury on CKD stage III, oliguric likely due to ischemic ATN in the setting of septic shock and A. fib with RVR: -Serum creatinine level continue to worsen to 4.44 today with no significant urine output.  Patient looks intravascularly  depleted on physical  exam, agree with IV albumin and sodium bicarbonate IV fluid.  Imaging studies with no obstruction.  Continue to monitor urine output, electrolytes and avoid IV contrast or nephrotoxins.  Monitor Vanco level. -If there is no improvement in renal function or urine output, patient will need CRRT.  I have discussed this with the patient but I am not sure if she understood completely.  #Metabolic acidosis: On sodium bicarbonate IVF.  Monitor labs.  #Hyponatremia: Check urine lites.  Treatment as above and monitor labs.  #Septic shock: CT scan with focal colitis.  Patient is currently on ceftriaxone and vancomycin.  On Levophed 4 mcg.  #A. fib with RVR: Still tachycardic, on amiodarone.  #Hypocalcemia: Corrected calcium level 7.93.  Received IV calcium today.  I discussed with Dr. Eartha Inch from PCCM.  We will continue to follow.  Dron Tanna Furry 01/05/2019, 12:10 PM  [employer].

## 2019-01-06 ENCOUNTER — Inpatient Hospital Stay (HOSPITAL_COMMUNITY): Payer: Medicare Other

## 2019-01-06 ENCOUNTER — Encounter: Payer: Medicare Other | Admitting: Family

## 2019-01-06 LAB — POCT I-STAT 7, (LYTES, BLD GAS, ICA,H+H)
Acid-base deficit: 12 mmol/L — ABNORMAL HIGH (ref 0.0–2.0)
Bicarbonate: 17.1 mmol/L — ABNORMAL LOW (ref 20.0–28.0)
Calcium, Ion: 0.79 mmol/L — CL (ref 1.15–1.40)
HCT: 23 % — ABNORMAL LOW (ref 36.0–46.0)
Hemoglobin: 7.8 g/dL — ABNORMAL LOW (ref 12.0–15.0)
O2 Saturation: 100 %
Patient temperature: 98.6
Potassium: 5.1 mmol/L (ref 3.5–5.1)
Sodium: 132 mmol/L — ABNORMAL LOW (ref 135–145)
TCO2: 19 mmol/L — AB (ref 22–32)
pCO2 arterial: 55.2 mmHg — ABNORMAL HIGH (ref 32.0–48.0)
pH, Arterial: 7.099 — CL (ref 7.350–7.450)
pO2, Arterial: 398 mmHg — ABNORMAL HIGH (ref 83.0–108.0)

## 2019-01-06 LAB — GLUCOSE, CAPILLARY
GLUCOSE-CAPILLARY: 56 mg/dL — AB (ref 70–99)
GLUCOSE-CAPILLARY: 71 mg/dL (ref 70–99)
GLUCOSE-CAPILLARY: 27 mg/dL — AB (ref 70–99)
Glucose-Capillary: 108 mg/dL — ABNORMAL HIGH (ref 70–99)
Glucose-Capillary: 30 mg/dL — CL (ref 70–99)
Glucose-Capillary: 50 mg/dL — ABNORMAL LOW (ref 70–99)
Glucose-Capillary: 76 mg/dL (ref 70–99)
Glucose-Capillary: 99 mg/dL (ref 70–99)
Glucose-Capillary: 99 mg/dL (ref 70–99)

## 2019-01-06 LAB — POCT I-STAT EG7
Acid-base deficit: 15 mmol/L — ABNORMAL HIGH (ref 0.0–2.0)
Bicarbonate: 14.3 mmol/L — ABNORMAL LOW (ref 20.0–28.0)
Calcium, Ion: 0.8 mmol/L — CL (ref 1.15–1.40)
HCT: 21 % — ABNORMAL LOW (ref 36.0–46.0)
Hemoglobin: 7.1 g/dL — ABNORMAL LOW (ref 12.0–15.0)
O2 Saturation: 39 %
PO2 VEN: 31 mmHg — AB (ref 32.0–45.0)
Patient temperature: 97.5
Potassium: 5.2 mmol/L — ABNORMAL HIGH (ref 3.5–5.1)
Sodium: 133 mmol/L — ABNORMAL LOW (ref 135–145)
TCO2: 16 mmol/L — ABNORMAL LOW (ref 22–32)
pCO2, Ven: 48.8 mmHg (ref 44.0–60.0)
pH, Ven: 7.071 — CL (ref 7.250–7.430)

## 2019-01-06 LAB — COMPREHENSIVE METABOLIC PANEL
ALT: 149 U/L — ABNORMAL HIGH (ref 0–44)
AST: 1203 U/L — ABNORMAL HIGH (ref 15–41)
Albumin: 2.4 g/dL — ABNORMAL LOW (ref 3.5–5.0)
Alkaline Phosphatase: 504 U/L — ABNORMAL HIGH (ref 38–126)
Anion gap: 15 (ref 5–15)
BUN: 60 mg/dL — ABNORMAL HIGH (ref 8–23)
CALCIUM: 5.9 mg/dL — AB (ref 8.9–10.3)
CO2: 16 mmol/L — ABNORMAL LOW (ref 22–32)
Chloride: 102 mmol/L (ref 98–111)
Creatinine, Ser: 5.13 mg/dL — ABNORMAL HIGH (ref 0.44–1.00)
GFR calc Af Amer: 9 mL/min — ABNORMAL LOW (ref >60)
GFR, EST NON AFRICAN AMERICAN: 7 mL/min — AB (ref >60)
Glucose, Bld: 176 mg/dL — ABNORMAL HIGH (ref 70–99)
Potassium: 5.4 mmol/L — ABNORMAL HIGH (ref 3.5–5.1)
Sodium: 133 mmol/L — ABNORMAL LOW (ref 135–145)
Total Bilirubin: 1.7 mg/dL — ABNORMAL HIGH (ref 0.3–1.2)
Total Protein: 5.1 g/dL — ABNORMAL LOW (ref 6.5–8.1)

## 2019-01-06 LAB — RENAL FUNCTION PANEL
Albumin: 2.2 g/dL — ABNORMAL LOW (ref 3.5–5.0)
Anion gap: 20 — ABNORMAL HIGH (ref 5–15)
BUN: 42 mg/dL — ABNORMAL HIGH (ref 8–23)
CHLORIDE: 100 mmol/L (ref 98–111)
CO2: 15 mmol/L — ABNORMAL LOW (ref 22–32)
Calcium: 6 mg/dL — CL (ref 8.9–10.3)
Creatinine, Ser: 3.79 mg/dL — ABNORMAL HIGH (ref 0.44–1.00)
GFR calc Af Amer: 12 mL/min — ABNORMAL LOW (ref >60)
GFR calc non Af Amer: 11 mL/min — ABNORMAL LOW (ref >60)
Glucose, Bld: 78 mg/dL (ref 70–99)
Phosphorus: 5.1 mg/dL — ABNORMAL HIGH (ref 2.5–4.6)
Potassium: 4.7 mmol/L (ref 3.5–5.1)
Sodium: 135 mmol/L (ref 135–145)

## 2019-01-06 LAB — LEGIONELLA PNEUMOPHILA SEROGP 1 UR AG: L. pneumophila Serogp 1 Ur Ag: NEGATIVE

## 2019-01-06 LAB — HEPATIC FUNCTION PANEL
ALT: 170 U/L — ABNORMAL HIGH (ref 0–44)
AST: 1311 U/L — ABNORMAL HIGH (ref 15–41)
Albumin: 2.1 g/dL — ABNORMAL LOW (ref 3.5–5.0)
Alkaline Phosphatase: 820 U/L — ABNORMAL HIGH (ref 38–126)
Bilirubin, Direct: 1.4 mg/dL — ABNORMAL HIGH (ref 0.0–0.2)
Indirect Bilirubin: 0.7 mg/dL (ref 0.3–0.9)
Total Bilirubin: 2.1 mg/dL — ABNORMAL HIGH (ref 0.3–1.2)
Total Protein: 5.2 g/dL — ABNORMAL LOW (ref 6.5–8.1)

## 2019-01-06 LAB — CBC
HCT: 20.2 % — ABNORMAL LOW (ref 36.0–46.0)
HEMOGLOBIN: 6.1 g/dL — AB (ref 12.0–15.0)
MCH: 28 pg (ref 26.0–34.0)
MCHC: 30.2 g/dL (ref 30.0–36.0)
MCV: 92.7 fL (ref 80.0–100.0)
NRBC: 0.7 % — AB (ref 0.0–0.2)
Platelets: 187 10*3/uL (ref 150–400)
RBC: 2.18 MIL/uL — AB (ref 3.87–5.11)
RDW: 18.6 % — ABNORMAL HIGH (ref 11.5–15.5)
WBC: 38.3 10*3/uL — ABNORMAL HIGH (ref 4.0–10.5)

## 2019-01-06 LAB — AMMONIA: Ammonia: 97 umol/L — ABNORMAL HIGH (ref 9–35)

## 2019-01-06 LAB — PROCALCITONIN: Procalcitonin: 3.94 ng/mL

## 2019-01-06 LAB — PREPARE RBC (CROSSMATCH)

## 2019-01-06 LAB — LACTIC ACID, PLASMA: Lactic Acid, Venous: 2.9 mmol/L (ref 0.5–1.9)

## 2019-01-06 LAB — URINE CULTURE: Culture: NO GROWTH

## 2019-01-06 LAB — PHOSPHORUS: Phosphorus: 6.9 mg/dL — ABNORMAL HIGH (ref 2.5–4.6)

## 2019-01-06 LAB — MRSA PCR SCREENING: MRSA by PCR: NEGATIVE

## 2019-01-06 LAB — MAGNESIUM: Magnesium: 2.2 mg/dL (ref 1.7–2.4)

## 2019-01-06 MED ORDER — DEXTROSE 50 % IV SOLN
25.0000 mL | Freq: Once | INTRAVENOUS | Status: AC
  Administered 2019-01-06: 25 mL via INTRAVENOUS

## 2019-01-06 MED ORDER — HEPARIN SODIUM (PORCINE) 1000 UNIT/ML DIALYSIS
1000.0000 [IU] | INTRAMUSCULAR | Status: DC | PRN
  Filled 2019-01-06: qty 6

## 2019-01-06 MED ORDER — MIDAZOLAM HCL 2 MG/2ML IJ SOLN: 1.0000 mg | INTRAMUSCULAR | Status: DC | PRN

## 2019-01-06 MED ORDER — FENTANYL CITRATE (PF) 100 MCG/2ML IJ SOLN
50.0000 ug | Freq: Once | INTRAMUSCULAR | Status: AC
  Administered 2019-01-06: 50 ug via INTRAVENOUS

## 2019-01-06 MED ORDER — ROCURONIUM BROMIDE 50 MG/5ML IV SOLN
50.0000 mg | Freq: Once | INTRAVENOUS | Status: AC
  Administered 2019-01-06: 50 mg via INTRAVENOUS

## 2019-01-06 MED ORDER — DEXTROSE 50 % IV SOLN
50.0000 mL | Freq: Once | INTRAVENOUS | Status: AC
  Administered 2019-01-07: 50 mL via INTRAVENOUS

## 2019-01-06 MED ORDER — MIDAZOLAM HCL 2 MG/2ML IJ SOLN
INTRAMUSCULAR | Status: AC
  Filled 2019-01-06: qty 2

## 2019-01-06 MED ORDER — CHLORHEXIDINE GLUCONATE 0.12% ORAL RINSE (MEDLINE KIT)
15.0000 mL | Freq: Two times a day (BID) | OROMUCOSAL | Status: DC
  Administered 2019-01-06 – 2019-01-07 (×3): 15 mL via OROMUCOSAL

## 2019-01-06 MED ORDER — CALCIUM GLUCONATE-NACL 2-0.675 GM/100ML-% IV SOLN
2.0000 g | Freq: Once | INTRAVENOUS | Status: AC
  Administered 2019-01-06: 2000 mg via INTRAVENOUS
  Filled 2019-01-06: qty 100

## 2019-01-06 MED ORDER — DEXTROSE 50 % IV SOLN: 50.0000 mL | Freq: Once | INTRAVENOUS | Status: DC

## 2019-01-06 MED ORDER — SODIUM CHLORIDE 0.9% IV SOLUTION: Freq: Once | INTRAVENOUS | Status: DC

## 2019-01-06 MED ORDER — ETOMIDATE 2 MG/ML IV SOLN
20.0000 mg | Freq: Once | INTRAVENOUS | Status: AC
  Administered 2019-01-06: 20 mg via INTRAVENOUS

## 2019-01-06 MED ORDER — DEXTROSE 50 % IV SOLN
INTRAVENOUS | Status: AC
  Filled 2019-01-06: qty 50

## 2019-01-06 MED ORDER — PRISMASOL BGK 0/2.5 32-2.5 MEQ/L REPLACEMENT SOLN
Status: DC
  Administered 2019-01-06 – 2019-01-07 (×2): via INTRAVENOUS_CENTRAL
  Filled 2019-01-06 (×4): qty 5000

## 2019-01-06 MED ORDER — FENTANYL CITRATE (PF) 100 MCG/2ML IJ SOLN: 50.0000 ug | Freq: Once | INTRAMUSCULAR | Status: DC

## 2019-01-06 MED ORDER — VASOPRESSIN 20 UNIT/ML IV SOLN
0.0300 [IU]/min | INTRAVENOUS | Status: DC
  Administered 2019-01-06: 0.03 [IU]/min via INTRAVENOUS
  Filled 2019-01-06 (×2): qty 2

## 2019-01-06 MED ORDER — DOCUSATE SODIUM 50 MG/5ML PO LIQD: 100.0000 mg | Freq: Two times a day (BID) | ORAL | Status: DC | PRN

## 2019-01-06 MED ORDER — FENTANYL BOLUS VIA INFUSION
25.0000 ug | INTRAVENOUS | Status: DC | PRN
  Filled 2019-01-06: qty 25

## 2019-01-06 MED ORDER — ORAL CARE MOUTH RINSE
15.0000 mL | OROMUCOSAL | Status: DC
  Administered 2019-01-06 – 2019-01-07 (×11): 15 mL via OROMUCOSAL

## 2019-01-06 MED ORDER — FENTANYL CITRATE (PF) 100 MCG/2ML IJ SOLN
INTRAMUSCULAR | Status: AC
  Administered 2019-01-06: 50 ug via INTRAVENOUS
  Filled 2019-01-06: qty 2

## 2019-01-06 MED ORDER — PRISMASOL BGK 4/2.5 32-4-2.5 MEQ/L IV SOLN
INTRAVENOUS | Status: DC
  Administered 2019-01-06 – 2019-01-07 (×5): via INTRAVENOUS_CENTRAL
  Filled 2019-01-06 (×13): qty 5000

## 2019-01-06 MED ORDER — DEXTROSE 50 % IV SOLN
INTRAVENOUS | Status: AC
  Administered 2019-01-06: 50 mL
  Filled 2019-01-06: qty 50

## 2019-01-06 MED ORDER — PRISMASOL BGK 4/2.5 32-4-2.5 MEQ/L REPLACEMENT SOLN
Status: DC
  Administered 2019-01-06 – 2019-01-07 (×3): via INTRAVENOUS_CENTRAL
  Filled 2019-01-06 (×4): qty 5000

## 2019-01-06 MED ORDER — CALCIUM GLUCONATE-NACL 1-0.675 GM/50ML-% IV SOLN
1.0000 g | Freq: Once | INTRAVENOUS | Status: AC
  Administered 2019-01-06: 1000 mg via INTRAVENOUS
  Filled 2019-01-06: qty 50

## 2019-01-06 MED ORDER — FENTANYL 2500MCG IN NS 250ML (10MCG/ML) PREMIX INFUSION
25.0000 ug/h | INTRAVENOUS | Status: DC
  Administered 2019-01-06: 50 ug/h via INTRAVENOUS
  Filled 2019-01-06: qty 250

## 2019-01-06 NOTE — Progress Notes (Signed)
SLP Cancellation Note  Patient Details Name: Kendra Todd MRN: [id number] DOB: [birth year]   Cancelled treatment:       Reason Eval/Treat Not Completed: Medical issues which prohibited therapy. Pt is currently intubated.   [name] I. Hardin Negus, [place], [place] Office number [phone] Pager [place] 01/06/2019, 10:53 AM

## 2019-01-06 NOTE — Progress Notes (Signed)
Initial Nutrition Assessment  DOCUMENTATION CODES:   Not applicable  INTERVENTION:   Recommend Vital High Protein @ 35 ml/hr 60 ml Prostat BID  Provides: 1304 kcal, 133 grams protein, and 702 ml free water.   NUTRITION DIAGNOSIS:   Inadequate oral intake related to inability to eat as evidenced by NPO status. Ongoing   GOAL:   Patient will meet greater than or equal to 90% of their needs Not met.   MONITOR:   I & O's, Vent status  ASSESSMENT:    78 yo female admitted with sepsis due to RLL CAP with acute respiratory failure, AKI on CKD III. PMH includes DM, HTN, CHF   Pt discussed during ICU rounds and with RN.  Pt has decompensated, plan for CRRT for 24-48 hrs and then will discuss with family.   Patient is currently intubated on ventilator support MV: 9.7 L/min Temp (24hrs), Avg:96.4 F (35.8 C), Min:95 F (35 C), Max:97.9 F (36.6 C)  Medications reviewed and include: SSI Levo @ 40 mcg Vaso @ .03  Labs reviewed: Na 132 (L), PO4: 6.9 (H)  Labs: CBGs 98-165, Creatinine 3.09, BUN 47 Meds: NS at 100 ml/hr  +15 L MAP: 103  Diet Order:   Diet Order            Diet NPO time specified  Diet effective now              EDUCATION NEEDS:   No education needs have been identified at this time  Skin:  Skin Assessment: Reviewed RN Assessment  Last BM:  2/27  Height:   Ht Readings from Last 1 Encounters:  01/06/19 '5\' 1"'$  (1.549 m)    Weight:   Wt Readings from Last 1 Encounters:  01/06/19 82 kg  Suspect usual weight is 63.7 kg, pt now 15 L positive  Ideal Body Weight:  50 kg  BMI:  Body mass index is 34.16 kg/m.  Estimated Nutritional Needs:   Kcal:  1213  Protein:  127-159 grams  Fluid:  >/= 1.6 L  Maylon Peppers RD, LDN, CNSC [phone] Pager [phone] After Hours Pager

## 2019-01-06 NOTE — Procedures (Signed)
Intubation Procedure Note Kendra Todd [id number] 1941-10-06  Procedure: Intubation Indications: Respiratory insufficiency  Procedure Details Consent: Unable to obtain consent because of emergent medical necessity. Time Out: Verified patient identification, verified procedure, site/side was marked, verified correct patient position, special equipment/implants available, medications/allergies/relevent history reviewed, required imaging and test results available.  Performed  Drugs:  20 mg Etomidate, 50 mg Rocuronium. VL x 1 with # 3 blade. Grade 1 view. 7.5 tube visualized passing through vocal cords. Following intubation:  positive color change on ETCO2, condensation seen in endotracheal tube, equal breath sounds bilaterally.  Evaluation Hemodynamic Status: BP stable throughout; O2 sats: stable throughout Patient's Current Condition: stable Complications: No apparent complications Patient did tolerate procedure well. Chest X-ray ordered to verify placement.  CXR: pending.   Montey Hora, [facility] Pager: [phone]  or [phone] 01/06/2019, 5:13 AM

## 2019-01-06 NOTE — Progress Notes (Signed)
[facility] NEPHROLOGY PROGRESS NOTE  Assessment/ Plan: Pt is a 78 y.o. yo female with diabetes, hypertension, CAD status post stent and CABG, dyslipidemia, thoracoabdominal aortic aneurysm, PAD, CKD stage III b/l cr 1.5-1.8, admitted with right lower lobe pneumonia, sepsis and hypotension.  Patient developed A. fib with RVR and now with worsening renal failure.  #Acute kidney injury on CKD stage III, oliguric likely due to ischemic ATN in the setting of septic shock and A. fib with RVR: -Patient with no urine output, serum creatinine level worsening, acidotic and hyperkalemia.  I discussed with ICU team as well as with multiple family members.  Plan to start CRRT as a trial for about 48 hours.  Monitor electrolytes, urine output.  Currently on Levophed and vasopressin. -no net UF  #Mild hyperkalemia: Starting CRRT.  #Metabolic acidosis: On sodium bicarbonate IVF.  Monitor labs.  #Hyponatremia: Check urine lites.  CRRT.  #Septic shock: CT scan with focal colitis.  Currently on Flagyl.  Levo and vasopressin for shock.  Blood pressure still borderline low.  #A. fib with RVR: Heart rate better controlled.  On amiodarone.  #Hypocalcemia: Replete with calcium gluconate.   Subjective: Seen and examined.  Patient is intubated.  On 2 pressors.  No urine output.  Family at bedside. Objective Vital signs in last 24 hours: Vitals:   01/06/19 0912 01/06/19 0927 01/06/19 1045 01/06/19 1119  BP:  118/81 134/64   Pulse: 71 70 67   Resp: (!) 26 (!) 26 (!) 26   Temp: (!) 95.5 F (35.3 C) (!) 95.5 F (35.3 C) (!) 95 F (35 C)   TempSrc: Rectal Axillary Rectal   SpO2:    100%  Weight:      Height:       Weight change:   Intake/Output Summary (Last 24 hours) at 01/06/2019 1120 Last data filed at 01/06/2019 1100 Gross per 24 hour  Intake 2167.76 ml  Output 76 ml  Net 2091.76 ml       Labs: Basic Metabolic Panel: Recent Labs  Lab 01/05/19 0358 01/05/19 1526   01/06/19 0143 01/06/19 0431 01/06/19 0612  NA 129* 133*   < > 133* 133* 132*  K 4.3 5.0   < > 5.2* 5.4* 5.1  CL 101 106  --   --  102  --   CO2 13* 13*  --   --  16*  --   GLUCOSE 265* 79  --   --  176*  --   BUN 54* 60*  --   --  60*  --   CREATININE 4.44* 5.07*  --   --  5.13*  --   CALCIUM 6.1* 6.3*  --   --  5.9*  --   PHOS  --   --   --   --  6.9*  --    < > = values in this interval not displayed.   Liver Function Tests: Recent Labs  Lab 12/18/2018 1708 01/05/19 0358 01/06/19 0431  AST 38 959* 1,203*  ALT 17 138* 149*  ALKPHOS 99 406* 504*  BILITOT 0.9 1.1 1.7*  PROT 6.6 5.3* 5.1*  ALBUMIN 2.8* 1.7* 2.4*   Recent Labs  Lab 12/16/2018 1708 01/04/19 1824  LIPASE 41 88*  AMYLASE  --  202*   No results for input(s): AMMONIA in the last 168 hours. CBC: Recent Labs  Lab 01/03/19 0319 01/03/19 2342 01/04/19 0546  01/05/19 0358  01/06/19 0143 01/06/19 0238 01/06/19 0612  WBC 18.7* 27.6*  30.5*  --  36.1*  --   --  38.3*  --   NEUTROABS 16.8*  --  29.9*  --  34.0*  --   --   --   --   HGB 8.3* 7.9* 7.9*   < > 8.1*   < > 7.1* 6.1* 7.8*  HCT 26.5* 25.4* 25.8*   < > 26.0*   < > 21.0* 20.2* 23.0*  MCV 88.0 88.2 87.5  --  87.8  --   --  92.7  --   PLT 183 170 180  --  210  --   --  187  --    < > = values in this interval not displayed.   Cardiac Enzymes: Recent Labs  Lab 01/01/19 0335  CKTOTAL 68   CBG: Recent Labs  Lab 01/05/19 1935 01/05/19 2334 01/06/19 0352 01/06/19 0413 01/06/19 0752  GLUCAP 70 97 56* 99 108*    Iron Studies: No results for input(s): IRON, TIBC, TRANSFERRIN, FERRITIN in the last 72 hours. Studies/Results: Ct Abdomen Pelvis Wo Contrast  Result Date: 01/05/2019 CLINICAL DATA:  Abdominal pain and elevated LFTs, fevers EXAM: CT ABDOMEN AND PELVIS WITHOUT CONTRAST TECHNIQUE: Multidetector CT imaging of the abdomen and pelvis was performed following the standard protocol without IV contrast. COMPARISON:  Ultrasound from 01/04/2019, CT from  12/29/2018 FINDINGS: Lower chest: Increasing right lower lobe consolidation is noted with associated small effusion. Tortuous descending thoracic aorta is noted with calcification and aneurysmal dilatation. Just above the aortic hiatus it measures approximately 4.1 cm. The overall appearance is similar to that seen on the prior exam. Heavy coronary calcifications are noted. Hepatobiliary: Gallbladder is well distended and within normal limits. No focal hepatic mass lesion is seen. Pancreas: Unremarkable. No pancreatic ductal dilatation or surrounding inflammatory changes. Spleen: Normal in size without focal abnormality. Adrenals/Urinary Tract: The adrenal glands are within normal limits bilaterally. Kidneys are well visualized bilaterally without obstructive change. No renal calculi are seen. Mild atrophy of the left kidney is noted stable from the previous exam. The bladder is decompressed by Foley catheter. Stomach/Bowel: Scattered diverticular change of the colon is noted. No obstructive changes are noted. Mild wall thickening and pericolonic inflammatory changes are noted about the cecum consistent with focal colitis. No perforation or abscess is seen. The appendix is not well visualized. This is consistent with the given clinical history. Small bowel is unremarkable. The stomach is under distended. Vascular/Lymphatic: Diffuse aortic calcifications are noted stable from the previous exam. 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 Renal artery stents are noted bilaterally. No new focal abnormality is seen. Reproductive: Uterus is not seen consistent with prior hysterectomy. Other: Mild presacral soft tissue swelling is noted new from the prior exam which may be related to some minimal free fluid. Changes of anasarca are noted in the abdominal wall new from the prior study. Musculoskeletal: Degenerative changes of the lumbar spine are noted. No acute bony  abnormality is seen. IMPRESSION: New changes consistent with focal colitis in the ascending colon and cecum. Stable thoracoabdominal aortic aneurysm with penetrating ulcer just below the right renal artery. New changes of anasarca and mild fluid in the presacral space. No abscess is noted. Increasing right lower lobe consolidation with small effusion. Electronically Signed   By: Inez Catalina M.D.   On: 01/05/2019 10:45   US Abdomen Complete  Result Date: 01/04/2019 CLINICAL DATA:  Generalized abdominal tenderness. EXAM: ABDOMEN ULTRASOUND COMPLETE COMPARISON:  None.  FINDINGS: Gallbladder: No gallstones or wall thickening visualized. No sonographic Murphy sign noted by sonographer. Common bile duct: Diameter: Up to 6 mm Liver: No focal lesion identified. Within normal limits in parenchymal echogenicity. Portal vein is patent on color Doppler imaging with normal direction of blood flow towards the liver. IVC: No abnormality visualized. Pancreas: The pancreatic duct measures 2.1 mm which is borderline to mildly prominent. No focal mass. Spleen: Size and appearance within normal limits. Right Kidney: Length: 9.5 cm.  Increased cortical echogenicity. Left Kidney: Length: 9.6 cm.  Increased cortical echogenicity. Abdominal aorta: The mid abdominal aorta is obscured by gas. Aneurysmal dilatation of the abdominal aorta measuring up to 3.4 cm. The aorta measured up to 3.5 cm on the December 30, 2018 CT scan. Other findings: Complex right pleural effusion. IMPRESSION: 1. Complex right pleural effusion. 2. Abdominal aortic aneurysm, better seen on recent CT imaging. 3. Medical renal disease. 4. The pancreatic duct measures 2.1 mm which is borderline. Electronically Signed   By: Dorise Bullion III M.D   On: 01/04/2019 23:44   Dg Chest Port 1 View  Result Date: 01/06/2019 CLINICAL DATA:  Intubation EXAM: PORTABLE CHEST 1 VIEW COMPARISON:  Yesterday FINDINGS: New endotracheal tube with tip 1 cm above the carina. New  orogastric tube which reaches the stomach. Cardiomegaly and aortic tortuosity. There is aortic aneurysm by preceding chest CT. Known right lower lobe consolidation with small pleural effusion. No pneumothorax or interstitial opacities. IMPRESSION: 1. New endotracheal tube with tip 1 cm above the carina. 2. New orogastric tube in good position. 3. Known right lower lobe pneumonia with pleural effusion. Electronically Signed   By: Monte Fantasia M.D.   On: 01/06/2019 05:49   Dg Chest Port 1 View  Result Date: 01/05/2019 CLINICAL DATA:  Respiratory failure,Htn,mi,stroke EXAM: PORTABLE CHEST - 1 VIEW COMPARISON:  01/03/2019 FINDINGS: Persistent right pleural effusion and right lower lung airspace opacity. Mild cardiomegaly. Thoracic aortic aneurysm. Previous CABG. No pneumothorax. Sternotomy wires. IMPRESSION: Persistent right pleural effusion and right lower lung airspace disease. Electronically Signed   By: Lucrezia Europe M.D.   On: 01/05/2019 07:39   Dg Abd Portable 1v  Result Date: 01/06/2019 CLINICAL DATA:  Check orogastric tube placement EXAM: PORTABLE ABDOMEN - 1 VIEW COMPARISON:  Abdominal CT from yesterday FINDINGS: New orogastric tube with distal folding at the level of the low lying gastric body. Gas within relatively featureless left colon which was negative by recent CT. No generalized obstructive pattern. IMPRESSION: Orogastric tube which is folded in the distal stomach. Electronically Signed   By: Monte Fantasia M.D.   On: 01/06/2019 05:50    Medications: Infusions: .  prismasol BGK 4/2.5 500 mL/hr at 01/06/19 1004  . sodium chloride 10 mL (01/06/19 1102)  . sodium chloride    . amiodarone 30 mg/hr (01/06/19 0600)  . calcium gluconate 1,000 mg (01/06/19 1104)  . cefTRIAXone (ROCEPHIN)  IV 2 g (01/05/19 1802)  . fentaNYL infusion INTRAVENOUS 50 mcg/hr (01/06/19 0802)  . metronidazole 100 mL/hr at 01/06/19 0600  . norepinephrine (LEVOPHED) Adult infusion 40 mcg/min (01/06/19 0600)  .  prismasol BGK 0/2.5 300 mL/hr at 01/06/19 1004  . prismasol BGK 4/2.5 1,500 mL/hr at 01/06/19 1005  .  sodium bicarbonate (isotonic) infusion in sterile water 150 mL/hr at 01/06/19 1100  . vasopressin (PITRESSIN) infusion - *FOR SHOCK* 0.03 Units/min (01/06/19 0600)    Scheduled Medications: . sodium chloride   Intravenous Once  . chlorhexidine gluconate (MEDLINE KIT)  15 mL Mouth  Rinse BID  . Chlorhexidine Gluconate Cloth  6 each Topical Daily  . feeding supplement (ENSURE ENLIVE)  237 mL Oral BID BM  . fentaNYL (SUBLIMAZE) injection  50 mcg Intravenous Once  . heparin  5,000 Units Subcutaneous Q8H  . insulin aspart  0-15 Units Subcutaneous Q4H  . ipratropium-albuterol  3 mL Nebulization TID  . latanoprost  1 drop Both Eyes QHS  . mouth rinse  15 mL Mouth Rinse 10 times per day  . pantoprazole (PROTONIX) IV  40 mg Intravenous Q24H  . sodium chloride flush  10-40 mL Intracatheter Q12H    have reviewed scheduled and prn medications.  Physical Exam: General: Sedated and intubated Heart:RRR, s1s2 nl, no rubs Lungs: Coarse breath sound bilateral, no wheezing Abdomen:soft, non-distended Extremities:No edema Dialysis Access: Femoral catheter.  Kendra Todd 01/06/2019,11:20 AM  LOS: 6 days

## 2019-01-06 NOTE — Progress Notes (Signed)
NAME:  Kendra Todd, MRN:  [id number], DOB:  1941/07/31, LOS: 6 ADMISSION DATE:  12/20/2018, CONSULTATION DATE:  01/04/2019 REFERRING MD:  Cathlean Sauer, CHIEF COMPLAINT:  Hypotension, Worsening Leukocytosis,  RLL Pneumonia, ? sepsis  Brief History   This is a 78 y.o., female admitted on 12/10/2018 for right lower lobe pneumonia sepsis hypotension.  Patient with past medical history of former smoker quit 2014 presented to the hospital generalized weakness paraseptal atrial fibrillation, hypertension, diastolic heart failure, chronic kidney disease a stable thoracic aneurysm on CT imaging.  Patient found to be fatigued with fevers generalized weakness.  T-max 102.  Patient was started on antibiotics.  12 L positive white count 30,000.  Patient in atrial fibrillation with RVR.  Patient was placed on amiodarone.  Critical care was consulted for recommendations and management and transfer to the intensive care unit.   Significant Hospital Events   Admission 12/22/2018 01/05/2019 required intubation 01/06/2019 hypotensive  Consults:  01/04/2019  Procedures:  01/05/2019 intubation> 01/05/2019 hemodialysis catheter placed femoral area>>  Significant Diagnostic Tests:  CT Chest w/o contrast 01/04/2019 Consolidation of the majority of the RIGHT LOWER lobe likely representing pneumonia. Small RIGHT pleural effusion. Radiographic follow-up to resolution recommended. Unchanged 4.4 cm descending thoracic aortic aneurysm. Cardiomegaly, CABG changes and coronary disease.   CT Abdomen 12/09/2018 Dense opacification in the right lower lobe most consistent with pneumonia. No acute abnormality within the abdomen or pelvis. Fatty infiltration of the liver.  RUQ u/s>>> neg acute. Complex R pleural effusion.   2D echo on 01/23/2019>> demonstrates ejection fraction 67%.  Right ventricle with mildly reduced systolic function.  Systolic pressure in the right ventricle is severely elevated with estimated pressure of 73.4.   Left atrial size was moderately dilated.    Micro Data:  12/27/2018>> Blood Cultures>> NEG 12/31/18>> Multiple species present, suggest recollection>>> 01/04/2019  Blood >>>> 01/06/2019 sputum culture>> 01/05/2019 urine culture>>  Antimicrobials:  Vancomycin  01/04/2019>>3/2 Azithromycin 12/29/2018>>3/2 Rocephin 12/08/2018>> 3/2 flagyl>>  Interim history/subjective:  Required intubation.  Continues to decline.  Made limited CODE BLUE no shock no CPR.  Objective   Blood pressure 118/64, pulse (!) 101, temperature (!) 96.1 F (35.6 C), temperature source Axillary, resp. rate (!) 26, height 5\' 1"  (1.549 m), weight 63.7 kg, SpO2 100 %.    Vent Mode: PRVC FiO2 (%):  [40 %-100 %] 40 % Set Rate:  [14 bmp-18 bmp] 18 bmp Vt Set:  [380 mL-400 mL] 400 mL PEEP:  [5 cmH20] 5 cmH20 Plateau Pressure:  [20 cmH20] 20 cmH20   Intake/Output Summary (Last 24 hours) at 01/06/2019 0626 Last data filed at 01/06/2019 9485 Gross per 24 hour  Intake 2335.52 ml  Output 10 ml  Net 2325.52 ml   Filed Weights   12/28/2018 2058  Weight: 63.7 kg    Examination: General: Elderly female currently intubated and poorly responsive HEENT: Endotracheal tube in place Neuro: Does not follow commands at this time CV: Heart sounds are distant PULM: Coarse rhonchi bilaterally decreased breath sounds right base GI: Distended without bowel sounds Extremities: warm/dry, 2+ edema Skin: no rashes or lesions    Resolved Hospital Problem list     Assessment & Plan:  Sepsis/ septic shock r/t CAP - lactate reassuring Fever  Plan: Trend lactate Vasopressor support Follow culture data #1 I started his new job is working 5 of life apnea seen actually seen abnormality narrowing the night to 4 m limited code family wants to talk to you I told him saline utilized I let  the legal limit at the end of the Continue broad-spectrum antibiotics with ceftriaxone and Flagyl 01/06/2019 we will add sputum culture  Acute Respiratory  Failure 2/2 RLL CAP Plan Required intubation 12/14/7679 Profound metabolic acidosis Ventilator adjustments to decrease PCO2 to facilitate acidosis correction  Atrial Fibrillation Plan ICU monitoring  AKI on CKD stage 3 non gap metabolic acidosis Diminished UO Creatinine  Up trending  Suspected ATN serum bicarb at 17 Recent Labs  Lab 01/03/19 2342 01/04/19 0546 01/05/19 0358 01/05/19 1526 01/06/19 0431  CREATININE 3.76* 3.90* 4.44* 5.07* 5.13*   Plan Renal to follow Bicarbonate drip Hemodialysis catheter placed If family still wishes institute CRRT   Diastolic HF, EF 15-72% 04/2034 + 12 L Plan Monitor chest x-ray for volume overload 2D echo on 01/05/2019 with EF of 65% and right ventricular systolic pressure severely elevated.  DM Type II CBG (last 3)  Recent Labs    01/05/19 2334 01/06/19 0352 01/06/19 0413  GLUCAP 97 56* 99    Plan Sliding scale protocol every 4 hours   Nutrition Plan Continue n.p.o. Now intubated Place orogastric tube Hold feedings   abd pain  Fever  elevated LFT's - RUQ u/s neg acute abd issue  PLAN -  CT of abdomen pelvis on 01/05/2019 shows changes consistent with possible colitis in ascending colon and cecum.  Stable thoracoabdominal aortic aneurysm with penetrating ulcer just below the right renal artery. Follow LFTs Check ammonia level  Hypocalcemia  Hyponatremia  PLAN -  Follow chemistries Electrolytes as needed  Best practice:  Diet: NPO Pain/Anxiety/Delirium protocol (if indicated): n/a VAP protocol (if indicated): Imlemented DVT prophylaxis: heparin SQ GI prophylaxis: Protonix Glucose control: CBG with SSI Mobility: BR Code Status: 01/06/2019 she has seen no CPR no shock per request of family, status changed by Gaylyn Lambert, NP Family Communication: 01/06/2019 spoke with family sons and daughters.  No shock no CPR at this time.  Want to respect her wishes of no prolonged life support without extravasation of  improvement. Disposition: ICU monitoring  Labs   CBC: Recent Labs  Lab 01/02/2019 1708  01/02/19 0311 01/03/19 0319 01/03/19 2342 01/04/19 0546  01/05/19 0358 01/05/19 2054 01/06/19 0143 01/06/19 0238 01/06/19 0612  WBC 16.4*   < > 13.2* 18.7* 27.6* 30.5*  --  36.1*  --   --  38.3*  --   NEUTROABS 14.6*  --  11.6* 16.8*  --  29.9*  --  34.0*  --   --   --   --   HGB 9.9*   < > 8.3* 8.3* 7.9* 7.9*   < > 8.1* 6.8* 7.1* 6.1* 7.8*  HCT 32.5*   < > 26.1* 26.5* 25.4* 25.8*   < > 26.0* 20.0* 21.0* 20.2* 23.0*  MCV 89.3   < > 87.6 88.0 88.2 87.5  --  87.8  --   --  92.7  --   PLT 189   < > 172 183 170 180  --  210  --   --  187  --    < > = values in this interval not displayed.    Basic Metabolic Panel: Recent Labs  Lab 01/03/19 2342 01/04/19 0546  01/05/19 0358 01/05/19 1526 01/05/19 2054 01/06/19 0143 01/06/19 0431 01/06/19 0612  NA 134* 136   < > 129* 133* 133* 133* 133* 132*  K 4.2 4.3   < > 4.3 5.0 3.1* 5.2* 5.4* 5.1  CL 109 107  --  101 106  --   --  102  --   CO2 13* 17*  --  13* 13*  --   --  16*  --   GLUCOSE 93 81  --  265* 79  --   --  176*  --   BUN 52* 51*  --  54* 60*  --   --  60*  --   CREATININE 3.76* 3.90*  --  4.44* 5.07*  --   --  5.13*  --   CALCIUM 6.8* 6.6*  --  6.1* 6.3*  --   --  5.9*  --   MG  --   --   --  2.0  --   --   --  2.2  --   PHOS  --   --   --   --   --   --   --  6.9*  --    < > = values in this interval not displayed.   GFR: Estimated Creatinine Clearance: 7.6 mL/min (A) (by C-G formula based on SCr of 5.13 mg/dL (H)). Recent Labs  Lab 01/03/19 2342  01/04/19 0546 01/04/19 1824 01/05/19 0358 01/05/19 1526 01/06/19 0238 01/06/19 0431  PROCALCITON  --   --   --  3.29 4.15  --   --  3.94  WBC 27.6*  --  30.5*  --  36.1*  --  38.3*  --   LATICACIDVEN  --    < > 0.9  --  1.5 1.9 2.9*  --    < > = values in this interval not displayed.    Liver Function Tests: Recent Labs  Lab 12/15/2018 1708 01/05/19 0358 01/06/19 0431    AST 38 959* 1,203*  ALT 17 138* 149*  ALKPHOS 99 406* 504*  BILITOT 0.9 1.1 1.7*  PROT 6.6 5.3* 5.1*  ALBUMIN 2.8* 1.7* 2.4*   Recent Labs  Lab 12/11/2018 1708 01/04/19 1824  LIPASE 41 88*  AMYLASE  --  202*   No results for input(s): AMMONIA in the last 168 hours.  ABG    Component Value Date/Time   PHART 7.099 (LL) 01/06/2019 0612   PCO2ART 55.2 (H) 01/06/2019 0612   PO2ART 398.0 (H) 01/06/2019 0612   HCO3 17.1 (L) 01/06/2019 0612   TCO2 19 (L) 01/06/2019 0612   ACIDBASEDEF 12.0 (H) 01/06/2019 0612   O2SAT 100.0 01/06/2019 0612     Coagulation Profile: No results for input(s): INR, PROTIME in the last 168 hours.  Cardiac Enzymes: Recent Labs  Lab 01/01/19 0335  CKTOTAL 68    HbA1C: Hgb A1c MFr Bld  Date/Time Value Ref Range Status  12/31/2018 04:01 AM 5.8 (H) 4.8 - 5.6 % Final    Comment:    (NOTE) Pre diabetes:          5.7%-6.4% Diabetes:              >6.4% Glycemic control for   <7.0% adults with diabetes   09/04/2018 12:34 PM 6.1 (H) <5.7 % of total Hgb Final    Comment:    For someone without known diabetes, a hemoglobin  A1c value between 5.7% and 6.4% is consistent with prediabetes and should be confirmed with a  follow-up test. . For someone with known diabetes, a value <7% indicates that their diabetes is well controlled. A1c targets should be individualized based on duration of diabetes, age, comorbid conditions, and other considerations. . This assay result is consistent with an increased risk of diabetes. . Currently, no consensus exists regarding use  of hemoglobin A1c for diagnosis of diabetes for children. .     CBG: Recent Labs  Lab 01/05/19 1529 01/05/19 1935 01/05/19 2334 01/06/19 0352 01/06/19 0413  GLUCAP 70 70 97 56* 99     Critical care App  time: 45 minutes    Richardson Landry Dora Clauss ACNP Maryanna Shape PCCM Pager [phone] till 1 pm 1120 If no answer page [phone] 01/06/2019, 7:36 AM

## 2019-01-06 NOTE — Progress Notes (Addendum)
eLink Physician-Brief Progress Note Patient Name: Kendra Todd DOB: 10-25-1941 MRN: [id number]   Date of Service  01/06/2019  HPI/Events of Note  Informed by RN that patient was desaturating.   Earlier on, the patient was taking shallow breaths but maintaining her sats.  She became more lethargic and desaturated into the 80s.   eICU Interventions  Intubate.  Continue pressors.      Intervention Category Major Interventions: Hypoxemia - evaluation and management  [name] 01/06/2019, 4:59 AM   6:17 AM  ABG 7.1/55400/17 Plan> Increase tidal volume to 400 and rate to 18. Decrease FiO2 to keep sats >92%.

## 2019-01-06 NOTE — Progress Notes (Signed)
eLink Physician-Brief Progress Note Patient Name: GLENOLA WHEAT DOB: 09-01-1941 MRN: [id number]   Date of Service  01/06/2019  HPI/Events of Note  Hypocalcemia  eICU Interventions  Order placed for 2 gm of calcium gluconate iv bolus     Intervention Category Intermediate Interventions: Electrolyte abnormality - evaluation and management  Frederik Pear 01/06/2019, 7:59 PM

## 2019-01-06 NOTE — Progress Notes (Signed)
   01/06/19 1928  Provider Notification  Provider Name/Title Elink CCM   Date Provider Notified 01/06/19  Time Provider Notified 1930  Notification Type Call  Notification Reason Other (Comment) (Critical Lab: Calcium 6.0)  Response Other (Comment) (Physician will place orders accordingly)  Date of Provider Response 01/06/19  Time of Provider Response 1930

## 2019-01-06 NOTE — Progress Notes (Signed)
78 year old woman admitted 2/25 with fevers and weakness, leukocytosis found to have right lower lobe pneumonia. She was transferred to ICU on 3/1 for worsening AKI, leukocytosis and atrial fibrillation/RVR CT chest and abdomen initially showed descending thoracic aneurysm and infrarenal AAA and right lower lobe pneumonia.  CT abdomen was repeated 3/2 due to diffuse tenderness which showed ascending colitis She developed worsening AKI/acidosis requiring intubation and increasing pressors overnight.  On exam-critically ill, hypothermic this morning, anasarca, decreased breath sounds bilateral, soft distended abdomen with hypoactive bowel sounds, no guarding, does not follow commands, sedated on low-dose fentanyl drip.  Labs reviewed shows anemia, extreme leukocytosis, worsening creatinine, hyperkalemia, increasing AST/ALT procalcitonin slight decrease.  Chest x-ray personally reviewed which shows ET tube in position and right lower lobe airspace disease/effusion.  Impression/plan Septic shock with multiorgan failure-presumed source is pneumonia and now she has a colitis of her ascending colon, this may be ischemic or infectious, no diarrhea to consider C. difficile. We will continue empiric antibiotics. Levophed has been maxed out at 40 mics and vasopressin has been added  AKI/metabolic acidosis/hyperkalemia-continue bicarbonate drip, dialysis catheter placed and will start CRRT if she tolerates hemodynamically, discussed with renal Replete calcium  Shock liver-follow, add low-dose tube feeds if she tolerates  Anemia-no evidence of blood loss, transfuse 1 unit PRBC, goal hemoglobin 8 and above  Goals of care discussion with 2 sons and daughter at the bedside-given poor prognosis, they are realistic, DNR issued, no escalation of care, we will Pressors at current levels, will attempt CRRT x 48 h to see if organ failure resolves.  If worsens, will transition to comfort care   The patient is  critically ill with multiple organ systems failure and requires high complexity decision making for assessment and support, frequent evaluation and titration of therapies, application of advanced monitoring technologies and extensive interpretation of multiple databases. Critical Care Time devoted to patient care services described in this note independent of APP/resident  time is 50 minutes.   Leanna Sato Elsworth Soho MD

## 2019-01-06 NOTE — Progress Notes (Signed)
CCM NP Minor requested that we turn the ventilation RR to 26.  RN spoke with RT and ensured that the rate would be changed.

## 2019-01-06 NOTE — Progress Notes (Signed)
ETT withdrawn 1cm and resecured 22 cm at the lip, per CCM order.

## 2019-01-07 ENCOUNTER — Inpatient Hospital Stay (HOSPITAL_COMMUNITY): Payer: Medicare Other

## 2019-01-07 LAB — RENAL FUNCTION PANEL
Albumin: 3 g/dL — ABNORMAL LOW (ref 3.5–5.0)
Anion gap: 22 — ABNORMAL HIGH (ref 5–15)
BUN: 23 mg/dL (ref 8–23)
CO2: 16 mmol/L — ABNORMAL LOW (ref 22–32)
Calcium: 6.3 mg/dL — CL (ref 8.9–10.3)
Chloride: 97 mmol/L — ABNORMAL LOW (ref 98–111)
Creatinine, Ser: 2.42 mg/dL — ABNORMAL HIGH (ref 0.44–1.00)
GFR calc Af Amer: 21 mL/min — ABNORMAL LOW (ref >60)
GFR, EST NON AFRICAN AMERICAN: 18 mL/min — AB (ref >60)
Glucose, Bld: 76 mg/dL (ref 70–99)
Phosphorus: 4.6 mg/dL (ref 2.5–4.6)
Potassium: 4.3 mmol/L (ref 3.5–5.1)
Sodium: 135 mmol/L (ref 135–145)

## 2019-01-07 LAB — GLUCOSE, CAPILLARY
Glucose-Capillary: 141 mg/dL — ABNORMAL HIGH (ref 70–99)
Glucose-Capillary: 68 mg/dL — ABNORMAL LOW (ref 70–99)
Glucose-Capillary: 70 mg/dL (ref 70–99)
Glucose-Capillary: 74 mg/dL (ref 70–99)
Glucose-Capillary: 81 mg/dL (ref 70–99)
Glucose-Capillary: 84 mg/dL (ref 70–99)

## 2019-01-07 LAB — CBC
HCT: 26.6 % — ABNORMAL LOW (ref 36.0–46.0)
Hemoglobin: 8.5 g/dL — ABNORMAL LOW (ref 12.0–15.0)
MCH: 28.1 pg (ref 26.0–34.0)
MCHC: 32 g/dL (ref 30.0–36.0)
MCV: 88.1 fL (ref 80.0–100.0)
Platelets: 144 10*3/uL — ABNORMAL LOW (ref 150–400)
RBC: 3.02 MIL/uL — AB (ref 3.87–5.11)
RDW: 17.2 % — ABNORMAL HIGH (ref 11.5–15.5)
WBC: 28.3 10*3/uL — ABNORMAL HIGH (ref 4.0–10.5)
nRBC: 2.7 % — ABNORMAL HIGH (ref 0.0–0.2)

## 2019-01-07 LAB — TYPE AND SCREEN
ABO/RH(D): A POS
Antibody Screen: NEGATIVE
UNIT DIVISION: 0
Unit division: 0

## 2019-01-07 LAB — BPAM RBC
Blood Product Expiration Date: 202003282359
Blood Product Expiration Date: 202003282359
ISSUE DATE / TIME: 202003030621
ISSUE DATE / TIME: 202003030621
Unit Type and Rh: 6200
Unit Type and Rh: 6200

## 2019-01-07 LAB — MAGNESIUM: Magnesium: 3.7 mg/dL — ABNORMAL HIGH (ref 1.7–2.4)

## 2019-01-07 MED ORDER — CALCIUM GLUCONATE-NACL 2-0.675 GM/100ML-% IV SOLN
2.0000 g | Freq: Once | INTRAVENOUS | Status: AC
  Administered 2019-01-07: 2000 mg via INTRAVENOUS
  Filled 2019-01-07: qty 100

## 2019-01-07 MED ORDER — ALBUMIN HUMAN 25 % IV SOLN
50.0000 g | Freq: Once | INTRAVENOUS | Status: AC
  Administered 2019-01-07: 50 g via INTRAVENOUS
  Filled 2019-01-07: qty 100

## 2019-01-07 MED ORDER — DEXTROSE 50 % IV SOLN
1.0000 | Freq: Once | INTRAVENOUS | Status: AC
  Administered 2019-01-07: 50 mL via INTRAVENOUS

## 2019-01-07 MED ORDER — DEXTROSE 50 % IV SOLN
INTRAVENOUS | Status: AC
  Filled 2019-01-07: qty 50

## 2019-01-07 MED ORDER — SODIUM CHLORIDE 0.9 % IV SOLN
3.0000 g | Freq: Once | INTRAVENOUS | Status: AC
  Administered 2019-01-07: 3 g via INTRAVENOUS
  Filled 2019-01-07: qty 30

## 2019-01-07 MED ORDER — SODIUM BICARBONATE 8.4 % IV SOLN
INTRAVENOUS | Status: DC
  Administered 2019-01-07 (×2): via INTRAVENOUS
  Filled 2019-01-07 (×4): qty 150

## 2019-01-07 MED ORDER — IPRATROPIUM-ALBUTEROL 0.5-2.5 (3) MG/3ML IN SOLN: 3.0000 mL | Freq: Two times a day (BID) | RESPIRATORY_TRACT | Status: DC

## 2019-01-07 MED ORDER — ALBUMIN HUMAN 5 % IV SOLN: 25.0000 g | Freq: Once | INTRAVENOUS | Status: DC

## 2019-01-08 LAB — GLUCOSE, CAPILLARY
GLUCOSE-CAPILLARY: 126 mg/dL — AB (ref 70–99)
GLUCOSE-CAPILLARY: 102 mg/dL — AB (ref 70–99)
Glucose-Capillary: 132 mg/dL — ABNORMAL HIGH (ref 70–99)
Glucose-Capillary: 144 mg/dL — ABNORMAL HIGH (ref 70–99)

## 2019-01-09 ENCOUNTER — Telehealth: Payer: Self-pay

## 2019-01-09 LAB — CULTURE, BLOOD (ROUTINE X 2)
Culture: NO GROWTH
Culture: NO GROWTH
Special Requests: ADEQUATE
Special Requests: ADEQUATE

## 2019-01-09 LAB — CULTURE, RESPIRATORY W GRAM STAIN

## 2019-01-09 LAB — CULTURE, RESPIRATORY: SPECIAL REQUESTS: NORMAL

## 2019-01-09 NOTE — Telephone Encounter (Signed)
Received dc from Bernadene Person (original copy) DC is for burial. Patient is a patient of Doctor Elsworth Soho.  DC will be taken to [facility] for signature.

## 2019-01-13 NOTE — Telephone Encounter (Signed)
Received original D/C signed-Funeral home notified for pick -up.  

## 2019-01-29 ENCOUNTER — Encounter (HOSPITAL_COMMUNITY): Payer: Medicare Other

## 2019-01-29 ENCOUNTER — Ambulatory Visit: Payer: Medicare Other | Admitting: Vascular Surgery

## 2019-02-04 NOTE — Progress Notes (Signed)
78 year old woman admitted 2/25 with fevers and weakness, leukocytosis found to have right lower lobe pneumonia. She was transferred to ICU on 3/1 for worsening AKI, leukocytosis and atrial fibrillation/RVR CT chest and abdomen initially showed descending thoracic aneurysm and infrarenal AAA and right lower lobe pneumonia.  CT abdomen was repeated 3/2 due to diffuse tenderness which showed ascending colitis 3/3 She developed worsening AKI/acidosis requiring intubation and increasing pressors   After goals of care discussion with family, she was started on CRRT  She remains critically ill, on high-dose Levophed and vasopressin, less responsive, on CRRT, hypothermic on warmer, mottled extremities, decreased breath sounds bilateral, 2+ anasarca, decreased breath sounds bilateral, S1-S2 irregular on amiodarone drip.  Stricture personally reviewed which shows right lower lobe airspace disease with effusion and ET tube in position. Labs reviewed which shows low bicarbonate, decreasing creatinine on CRRT, mild anemia and decreasing leukocytosis.  Impression/plan  Septic shock with multiorgan failure-source pneumonia and ascending colitis Continue empiric ceftriaxone and Flagyl, cultures negative, await repeat respiratory culture from 3/3 Possibility of ischemic bowel but doubt she is a candidate for any intervention  AKI/metabolic acidosis-hyperkalemia is resolved, continue CRRT per renal, bicarbonate drip and calcium supplementation  Shock liver-following LFTs intermittently. Add trickle tube feeds.  Goals of care-unfortunately she remains on high-dose pressors and has not rebounded after acidosis has been corrected on CRRT.  Will discuss with the family if we are to press forward or consider withdrawal of life support  The patient is critically ill with multiple organ systems failure and requires high complexity decision making for assessment and support, frequent evaluation and titration of  therapies, application of advanced monitoring technologies and extensive interpretation of multiple databases. Critical Care Time devoted to patient care services described in this note independent of APP/resident  time is 35 minutes.   Leanna Sato Elsworth Soho MD

## 2019-02-04 NOTE — Progress Notes (Signed)
eLink Physician-Brief Progress Note Patient Name: Kendra Todd DOB: [birth year] MRN: [id number]   Date of Service  01-31-2019  HPI/Events of Note  Hypotension  eICU Interventions  25 % Albumin 50 gm iv bolus x 1        Okoronkwo U Ogan 01/31/19, 2:23 AM

## 2019-02-04 NOTE — Discharge Summary (Signed)
78 year old woman admitted 2/25 with fevers and weakness, leukocytosis found to have right lower lobe pneumonia. She was transferred to ICU on 3/1 for worsening AKI, leukocytosis and atrial fibrillation/RVR CT chest and abdomen initially showed descending thoracic aneurysm and infrarenal AAA and right lower lobe pneumonia.  CT abdomen was repeated 3/2 due to diffuse tenderness which showed ascending colitis 3/3 She developed worsening AKI/acidosis requiring intubation and increasing pressors   CXR right lower lobe airspace disease with effusion and ET tube in position. Labs showed low bicarbonate, decreasing creatinine on CRRT, mild anemia and decreasing leukocytosis.  Impression/plan  Septic shock with multiorgan failure-source pneumonia and ascending colitis On empiric ceftriaxone and Flagyl, cultures negative Possibility of ischemic bowel but doubt she is a candidate for any intervention  AKI/metabolic acidosis-hyperkalemia  resolved, CRRT per renal, bicarbonate drip and calcium supplementation  Shock liver-following LFTs intermittently. Add trickle tube feeds.  COURSE  - After goals of care discussion with family, she was started on CRRT Unfortunately she remained on high-dose pressors and has not rebounded after acidosis has been corrected on CRRT.  She passed away on 01-26-23.   Cause of death -septic shock, AKI, community-acquired pneumonia, ascending colitis    Rakesh V. Elsworth Soho MD

## 2019-02-04 NOTE — Progress Notes (Signed)
Pt asystole on the monitor. Verified no heart rate with second RN. MD notified.

## 2019-02-04 NOTE — Progress Notes (Signed)
eLink Physician-Brief Progress Note Patient Name: Kendra Todd DOB: [birth year] MRN: [id number]   Date of Service  January 11, 2019  HPI/Events of Note  Hypoglycemia  eICU Interventions  D50 W one amp iv now then switch bicarb infusion to D5 NAHCO3, POC blood sugar checks Q 2 hours x 4 then Q 4 hours if stable.        Kerry Kass Ogan 01-11-2019, 12:04 AM

## 2019-02-04 NOTE — Progress Notes (Signed)
NAME:  Kendra Todd, MRN:  [id number], DOB:  [birth year], LOS: 7 ADMISSION DATE:  12/26/2018, CONSULTATION DATE:  01/04/2019 REFERRING MD:  Cathlean Sauer, CHIEF COMPLAINT:  Hypotension, Worsening Leukocytosis,  RLL Pneumonia, ? sepsis  Brief History   This is a 78 y.o., female admitted on 12/07/2018 for right lower lobe pneumonia sepsis hypotension.  Patient with past medical history of former smoker quit 2014 presented to the hospital generalized weakness paraseptal atrial fibrillation, hypertension, diastolic heart failure, chronic kidney disease a stable thoracic aneurysm on CT imaging.  Patient found to be fatigued with fevers generalized weakness.  T-max 102.  Patient was started on antibiotics.  12 L positive white count 30,000.  Patient in atrial fibrillation with RVR.  Patient was placed on amiodarone.  Critical care was consulted for recommendations and management and transfer to the intensive care unit.   Significant Hospital Events   Admission 12/28/2018 01/05/2019 required intubation 01/06/2019 hypotensive 01/06/2019 CRRT initiated  Consults:  01/04/2019  Procedures:  01/05/2019 intubation> 01/05/2019 hemodialysis catheter placed femoral area>>  Significant Diagnostic Tests:  CT Chest w/o contrast 01/04/2019 Consolidation of the majority of the RIGHT LOWER lobe likely representing pneumonia. Small RIGHT pleural effusion. Radiographic follow-up to resolution recommended. Unchanged 4.4 cm descending thoracic aortic aneurysm. Cardiomegaly, CABG changes and coronary disease.   CT Abdomen 12/27/2018 Dense opacification in the right lower lobe most consistent with pneumonia. No acute abnormality within the abdomen or pelvis. Fatty infiltration of the liver.  RUQ u/s>>> neg acute. Complex R pleural effusion.   2D echo on 01/23/2019>> demonstrates ejection fraction 67%.  Right ventricle with mildly reduced systolic function.  Systolic pressure in the right ventricle is severely elevated with  estimated pressure of 73.4.  Left atrial size was moderately dilated.    Micro Data:  12/16/2018>> Blood Cultures>> NEG 12/31/18>> Multiple species present, suggest recollection>>> 01/04/2019  Blood >>>> 01/06/2019 sputum culture>> 01/05/2019 urine culture>> negative  Antimicrobials:  Vancomycin  01/04/2019>>3/2 Azithromycin 01/01/2019>>3/2 Rocephin 12/17/2018>> 3/2 flagyl>>  Interim history/subjective:  Required intubation.  Continues to decline.  Made limited CODE BLUE no shock no CPR.  Objective   Blood pressure (!) 91/58, pulse 79, temperature (!) 94.3 F (34.6 C), temperature source Rectal, resp. rate 16, height 5\' 1"  (1.549 m), weight 84.4 kg, SpO2 98 %.    Vent Mode: PSV;CPAP FiO2 (%):  [40 %] 40 % Set Rate:  [26 bmp] 26 bmp Vt Set:  [400 mL] 400 mL PEEP:  [5 cmH20] 5 cmH20 Pressure Support:  [10 cmH20] 10 cmH20 Plateau Pressure:  [13 cmH20-22 cmH20] 13 cmH20   Intake/Output Summary (Last 24 hours) at Jan 17, 2019 0857 Last data filed at 2019/01/17 0800 Gross per 24 hour  Intake 5869.89 ml  Output 4782 ml  Net 1087.89 ml   Filed Weights   12/26/2018 2058 01/06/19 0800 01-17-2019 0500  Weight: 63.7 kg 82 kg 84.4 kg    Examination: General: Obese elderly female who is poorly responsive HEENT: Endotracheal tube in place. Neuro: Poorly responsive.  Questionable she opens her eyes to noxious stimuli CV: Heart sounds are regular PULM: even/non-labored, lungs bilaterally diminished IW:LNLG, non-tender, faint Extremities: warm/dry, plus edema  Skin: no rashes or lesions    Resolved Hospital Problem list     Assessment & Plan:  Sepsis/ septic shock r/t CAP - lactate reassuring Fever  Plan:  Trend lactate Follow culture data Continue ceftriaxone and Flagyl   Acute Respiratory Failure 2/2 RLL CAP Plan Continue full ventilatory support   Atrial Fibrillation Plan Amiodarone  drip ICU monitoring  AKI on CKD stage 3 non gap metabolic acidosis Diminished UO Creatinine   Up trending  Suspected ATN serum bicarb at 17 Recent Labs  Lab 01/05/19 0358 01/05/19 1526 01/06/19 0431 01/06/19 1650 23-Jan-2019 0353  CREATININE 4.44* 5.07* 5.13* 3.79* 2.42*   Plan Currently on CRRT day 1 of 2 per renal services Carbonate carbonate drip    Diastolic HF, EF 12-45% 06/997 + 12 L Plan Monitor chest x-ray Currently on CRRT for fluid volume reduction  DM Type II CBG (last 3)  Recent Labs    01-23-2019 0610 01/23/2019 0741 Jan 23, 2019 0826  GLUCAP 70 68* 141*  Hypoglycemia  Plan Bicarbonate drip changed to D5 W and sodium bicarbonate Sliding scale insulin called   Nutrition Plan N.p.o. Tube feedings as tolerated nutritional consult ordered   abd pain  Fever  elevated LFT's - RUQ u/s neg acute abd issue   Likely developing shock liver. PLAN -  CT of abdomen pelvis on 01/05/2019 shows changes consistent with possible colitis in ascending colon and cecum.  Stable thoracoabdominal aortic aneurysm with penetrating ulcer just below the right renal artery.  Noted LFTs being elevated Ammonia level noted to be 97  Hypocalcemia  Hyponatremia  PLAN -  Follow chemistries Replete electrolytes as needed  Best practice:  Diet: NPO Pain/Anxiety/Delirium protocol (if indicated): n/a VAP protocol (if indicated): Imlemented DVT prophylaxis: heparin SQ GI prophylaxis: Protonix Glucose control: CBG with SSI Mobility: BR Code Status: 01/06/2019 she has seen no CPR no shock per request of family, status changed by Gaylyn Lambert, NP 01-23-2019 to no escalation of care will attempt CRRT for 48 hours currently on 24 hours of 50 Family Communication: 01/06/2019 spoke with family sons and daughters.  No shock no CPR at this time.  Want to respect her wishes of no prolonged life support without extravasation of improvement. Disposition: ICU monitoring  Labs   CBC: Recent Labs  Lab 01/02/19 0311 01/03/19 0319 01/03/19 2342 01/04/19 0546  01/05/19 0358 01/05/19 2054  01/06/19 0143 01/06/19 0238 01/06/19 0612 2019-01-23 0353  WBC 13.2* 18.7* 27.6* 30.5*  --  36.1*  --   --  38.3*  --  28.3*  NEUTROABS 11.6* 16.8*  --  29.9*  --  34.0*  --   --   --   --   --   HGB 8.3* 8.3* 7.9* 7.9*   < > 8.1* 6.8* 7.1* 6.1* 7.8* 8.5*  HCT 26.1* 26.5* 25.4* 25.8*   < > 26.0* 20.0* 21.0* 20.2* 23.0* 26.6*  MCV 87.6 88.0 88.2 87.5  --  87.8  --   --  92.7  --  88.1  PLT 172 183 170 180  --  210  --   --  187  --  144*   < > = values in this interval not displayed.    Basic Metabolic Panel: Recent Labs  Lab 01/05/19 0358 01/05/19 1526  01/06/19 0143 01/06/19 0431 01/06/19 0612 01/06/19 1650 January 23, 2019 0353  NA 129* 133*   < > 133* 133* 132* 135 135  K 4.3 5.0   < > 5.2* 5.4* 5.1 4.7 4.3  CL 101 106  --   --  102  --  100 97*  CO2 13* 13*  --   --  16*  --  15* 16*  GLUCOSE 265* 79  --   --  176*  --  78 76  BUN 54* 60*  --   --  60*  --  42* 23  CREATININE 4.44* 5.07*  --   --  5.13*  --  3.79* 2.42*  CALCIUM 6.1* 6.3*  --   --  5.9*  --  6.0* 6.3*  MG 2.0  --   --   --  2.2  --   --  3.7*  PHOS  --   --   --   --  6.9*  --  5.1* 4.6   < > = values in this interval not displayed.   GFR: Estimated Creatinine Clearance: 18.6 mL/min (A) (by C-G formula based on SCr of 2.42 mg/dL (H)). Recent Labs  Lab 01/04/19 0546 01/04/19 1824 01/05/19 0358 01/05/19 1526 01/06/19 0238 01/06/19 0431 02/04/19 0353  PROCALCITON  --  3.29 4.15  --   --  3.94  --   WBC 30.5*  --  36.1*  --  38.3*  --  28.3*  LATICACIDVEN 0.9  --  1.5 1.9 2.9*  --   --     Liver Function Tests: Recent Labs  Lab 01/05/19 0358 01/06/19 0431 01/06/19 1650 Feb 04, 2019 0353  AST 959* 1,203* 1,311*  --   ALT 138* 149* 170*  --   ALKPHOS 406* 504* 820*  --   BILITOT 1.1 1.7* 2.1*  --   PROT 5.3* 5.1* 5.2*  --   ALBUMIN 1.7* 2.4* 2.2*  2.1* 3.0*   Recent Labs  Lab 01/04/19 1824  LIPASE 88*  AMYLASE 202*   Recent Labs  Lab 01/06/19 1650  AMMONIA 97*    ABG    Component Value  Date/Time   PHART 7.099 (LL) 01/06/2019 0612   PCO2ART 55.2 (H) 01/06/2019 0612   PO2ART 398.0 (H) 01/06/2019 0612   HCO3 17.1 (L) 01/06/2019 0612   TCO2 19 (L) 01/06/2019 0612   ACIDBASEDEF 12.0 (H) 01/06/2019 0612   O2SAT 100.0 01/06/2019 0612     Coagulation Profile: No results for input(s): INR, PROTIME in the last 168 hours.  Cardiac Enzymes: Recent Labs  Lab 01/01/19 0335  CKTOTAL 68    HbA1C: Hgb A1c MFr Bld  Date/Time Value Ref Range Status  12/31/2018 04:01 AM 5.8 (H) 4.8 - 5.6 % Final    Comment:    (NOTE) Pre diabetes:          5.7%-6.4% Diabetes:              >6.4% Glycemic control for   <7.0% adults with diabetes   09/04/2018 12:34 PM 6.1 (H) <5.7 % of total Hgb Final    Comment:    For someone without known diabetes, a hemoglobin  A1c value between 5.7% and 6.4% is consistent with prediabetes and should be confirmed with a  follow-up test. . For someone with known diabetes, a value <7% indicates that their diabetes is well controlled. A1c targets should be individualized based on duration of diabetes, age, comorbid conditions, and other considerations. . This assay result is consistent with an increased risk of diabetes. . Currently, no consensus exists regarding use of hemoglobin A1c for diagnosis of diabetes for children. .     CBG: Recent Labs  Lab 02-04-2019 0157 2019-02-04 0426 04-Feb-2019 0610 02-04-2019 0741 Feb 04, 2019 0826  GLUCAP 81 74 70 68* 141*     Critical care App  time: 45 minutes    Richardson Landry Abbegale Stehle ACNP [facility] Pager [phone] till 1 pm  If no answer page [phone] 2019-02-04, 8:57 AM

## 2019-02-04 DEATH — deceased

## 2019-02-16 ENCOUNTER — Other Ambulatory Visit: Payer: Self-pay

## 2019-02-19 ENCOUNTER — Ambulatory Visit: Payer: Medicare Other | Admitting: Internal Medicine
